# Patient Record
Sex: Female | Born: 1935
Health system: Southern US, Community
[De-identification: ages and names within clinical notes are randomized; demographics above are authoritative.]

## PROBLEM LIST (undated history)

## (undated) DIAGNOSIS — I350 Nonrheumatic aortic (valve) stenosis: Secondary | ICD-10-CM

## (undated) DIAGNOSIS — E039 Hypothyroidism, unspecified: Secondary | ICD-10-CM

## (undated) DIAGNOSIS — R51 Headache: Secondary | ICD-10-CM

## (undated) DIAGNOSIS — N184 Chronic kidney disease, stage 4 (severe): Secondary | ICD-10-CM

## (undated) DIAGNOSIS — I739 Peripheral vascular disease, unspecified: Secondary | ICD-10-CM

## (undated) DIAGNOSIS — G609 Hereditary and idiopathic neuropathy, unspecified: Secondary | ICD-10-CM

## (undated) DIAGNOSIS — I2510 Atherosclerotic heart disease of native coronary artery without angina pectoris: Secondary | ICD-10-CM

## (undated) DIAGNOSIS — L259 Unspecified contact dermatitis, unspecified cause: Secondary | ICD-10-CM

## (undated) DIAGNOSIS — I442 Atrioventricular block, complete: Secondary | ICD-10-CM

## (undated) DIAGNOSIS — D509 Iron deficiency anemia, unspecified: Secondary | ICD-10-CM

## (undated) DIAGNOSIS — E119 Type 2 diabetes mellitus without complications: Secondary | ICD-10-CM

## (undated) DIAGNOSIS — E7800 Pure hypercholesterolemia, unspecified: Secondary | ICD-10-CM

## (undated) DIAGNOSIS — J45909 Unspecified asthma, uncomplicated: Secondary | ICD-10-CM

## (undated) DIAGNOSIS — I447 Left bundle-branch block, unspecified: Secondary | ICD-10-CM

## (undated) DIAGNOSIS — R413 Other amnesia: Secondary | ICD-10-CM

## (undated) DIAGNOSIS — Z950 Presence of cardiac pacemaker: Secondary | ICD-10-CM

## (undated) DIAGNOSIS — I5032 Chronic diastolic (congestive) heart failure: Secondary | ICD-10-CM

## (undated) DIAGNOSIS — K219 Gastro-esophageal reflux disease without esophagitis: Secondary | ICD-10-CM

## (undated) DIAGNOSIS — I10 Essential (primary) hypertension: Secondary | ICD-10-CM

## (undated) DIAGNOSIS — I779 Disorder of arteries and arterioles, unspecified: Secondary | ICD-10-CM

## (undated) DIAGNOSIS — H547 Unspecified visual loss: Secondary | ICD-10-CM

## (undated) HISTORY — DX: Hereditary and idiopathic neuropathy, unspecified: G60.9

## (undated) HISTORY — DX: Left bundle-branch block, unspecified: I44.7

## (undated) HISTORY — DX: Peripheral vascular disease, unspecified: I73.9

## (undated) HISTORY — DX: Headache: R51

## (undated) HISTORY — PX: PTCA: SHX146

## (undated) HISTORY — DX: Unspecified asthma, uncomplicated: J45.909

## (undated) HISTORY — DX: Essential (primary) hypertension: I10

## (undated) HISTORY — PX: CARDIAC CATHETERIZATION: SHX172

## (undated) HISTORY — DX: Presence of cardiac pacemaker: Z95.0

## (undated) HISTORY — DX: Disorder of arteries and arterioles, unspecified: I77.9

## (undated) HISTORY — DX: Hypothyroidism, unspecified: E03.9

## (undated) HISTORY — PX: OTHER SURGICAL HISTORY: SHX169

## (undated) HISTORY — DX: Atrioventricular block, complete: I44.2

## (undated) HISTORY — DX: Unspecified contact dermatitis, unspecified cause: L25.9

## (undated) HISTORY — DX: Gastro-esophageal reflux disease without esophagitis: K21.9

## (undated) HISTORY — DX: Type 2 diabetes mellitus without complications: E11.9

## (undated) HISTORY — DX: Other amnesia: R41.3

## (undated) HISTORY — DX: Pure hypercholesterolemia, unspecified: E78.00

## (undated) HISTORY — DX: Iron deficiency anemia, unspecified: D50.9

---

## 1975-03-21 HISTORY — PX: OTHER SURGICAL HISTORY: SHX169

## 1975-03-21 HISTORY — PX: ABDOMINAL HYSTERECTOMY: SHX81

## 1995-03-21 HISTORY — PX: ESOPHAGEAL DILATION: SHX303

## 1995-09-26 ENCOUNTER — Encounter: Payer: Self-pay | Admitting: Cardiology

## 1999-02-03 ENCOUNTER — Encounter: Payer: Self-pay | Admitting: Cardiology

## 1999-04-21 HISTORY — PX: COLONOSCOPY: SHX174

## 1999-04-21 HISTORY — PX: ESOPHAGOGASTRODUODENOSCOPY: SHX1529

## 1999-05-12 ENCOUNTER — Ambulatory Visit (HOSPITAL_COMMUNITY): Admission: RE | Admit: 1999-05-12 | Discharge: 1999-05-12 | Payer: Self-pay | Admitting: *Deleted

## 1999-05-12 ENCOUNTER — Encounter (INDEPENDENT_AMBULATORY_CARE_PROVIDER_SITE_OTHER): Payer: Self-pay | Admitting: *Deleted

## 2000-09-12 ENCOUNTER — Other Ambulatory Visit: Admission: RE | Admit: 2000-09-12 | Discharge: 2000-09-12 | Payer: Self-pay | Admitting: Obstetrics and Gynecology

## 2001-03-20 HISTORY — PX: CATARACT EXTRACTION, BILATERAL: SHX1313

## 2001-10-31 ENCOUNTER — Other Ambulatory Visit: Admission: RE | Admit: 2001-10-31 | Discharge: 2001-10-31 | Payer: Self-pay | Admitting: Obstetrics and Gynecology

## 2001-12-04 ENCOUNTER — Encounter: Admission: RE | Admit: 2001-12-04 | Discharge: 2002-03-04 | Payer: Self-pay | Admitting: Family Medicine

## 2002-04-20 HISTORY — PX: ESOPHAGOGASTRODUODENOSCOPY: SHX1529

## 2002-04-22 ENCOUNTER — Ambulatory Visit (HOSPITAL_COMMUNITY): Admission: RE | Admit: 2002-04-22 | Discharge: 2002-04-22 | Payer: Self-pay | Admitting: *Deleted

## 2002-04-22 ENCOUNTER — Encounter (INDEPENDENT_AMBULATORY_CARE_PROVIDER_SITE_OTHER): Payer: Self-pay | Admitting: Specialist

## 2002-10-19 HISTORY — PX: CARDIOVASCULAR STRESS TEST: SHX262

## 2003-05-19 ENCOUNTER — Other Ambulatory Visit: Admission: RE | Admit: 2003-05-19 | Discharge: 2003-05-19 | Payer: Self-pay | Admitting: Obstetrics and Gynecology

## 2003-10-19 ENCOUNTER — Encounter: Payer: Self-pay | Admitting: Family Medicine

## 2003-10-19 LAB — CONVERTED CEMR LAB

## 2004-02-25 ENCOUNTER — Ambulatory Visit: Payer: Self-pay | Admitting: Family Medicine

## 2004-03-20 HISTORY — PX: REFRACTIVE SURGERY: SHX103

## 2004-03-20 HISTORY — PX: OTHER SURGICAL HISTORY: SHX169

## 2004-03-28 ENCOUNTER — Ambulatory Visit: Payer: Self-pay | Admitting: Family Medicine

## 2004-04-06 ENCOUNTER — Ambulatory Visit: Payer: Self-pay | Admitting: Family Medicine

## 2004-04-12 ENCOUNTER — Ambulatory Visit: Payer: Self-pay | Admitting: Family Medicine

## 2004-06-18 HISTORY — PX: COLONOSCOPY: SHX174

## 2004-07-05 ENCOUNTER — Ambulatory Visit (HOSPITAL_COMMUNITY): Admission: RE | Admit: 2004-07-05 | Discharge: 2004-07-05 | Payer: Self-pay | Admitting: *Deleted

## 2004-07-12 ENCOUNTER — Ambulatory Visit: Payer: Self-pay | Admitting: Family Medicine

## 2004-09-09 ENCOUNTER — Ambulatory Visit: Payer: Self-pay | Admitting: Family Medicine

## 2005-02-17 ENCOUNTER — Other Ambulatory Visit: Admission: RE | Admit: 2005-02-17 | Discharge: 2005-02-17 | Payer: Self-pay | Admitting: Obstetrics and Gynecology

## 2005-03-24 ENCOUNTER — Ambulatory Visit: Payer: Self-pay | Admitting: Family Medicine

## 2005-03-31 ENCOUNTER — Ambulatory Visit: Payer: Self-pay | Admitting: Family Medicine

## 2005-06-27 ENCOUNTER — Ambulatory Visit: Payer: Self-pay | Admitting: Family Medicine

## 2005-09-24 ENCOUNTER — Ambulatory Visit: Payer: Self-pay | Admitting: Gastroenterology

## 2005-09-24 ENCOUNTER — Inpatient Hospital Stay (HOSPITAL_COMMUNITY): Admission: EM | Admit: 2005-09-24 | Discharge: 2005-09-27 | Payer: Self-pay | Admitting: *Deleted

## 2005-09-25 ENCOUNTER — Encounter (INDEPENDENT_AMBULATORY_CARE_PROVIDER_SITE_OTHER): Payer: Self-pay | Admitting: Cardiology

## 2005-09-25 ENCOUNTER — Ambulatory Visit: Payer: Self-pay | Admitting: Internal Medicine

## 2005-10-04 ENCOUNTER — Ambulatory Visit: Payer: Self-pay | Admitting: Family Medicine

## 2005-10-09 ENCOUNTER — Ambulatory Visit: Payer: Self-pay | Admitting: Family Medicine

## 2005-10-18 ENCOUNTER — Ambulatory Visit: Payer: Self-pay | Admitting: Family Medicine

## 2005-12-28 ENCOUNTER — Ambulatory Visit: Payer: Self-pay

## 2006-01-04 ENCOUNTER — Ambulatory Visit: Payer: Self-pay | Admitting: Family Medicine

## 2006-06-27 ENCOUNTER — Encounter: Payer: Self-pay | Admitting: Family Medicine

## 2006-06-27 DIAGNOSIS — K219 Gastro-esophageal reflux disease without esophagitis: Secondary | ICD-10-CM

## 2006-06-27 DIAGNOSIS — E119 Type 2 diabetes mellitus without complications: Secondary | ICD-10-CM

## 2006-06-27 DIAGNOSIS — I509 Heart failure, unspecified: Secondary | ICD-10-CM | POA: Insufficient documentation

## 2006-06-27 DIAGNOSIS — D509 Iron deficiency anemia, unspecified: Secondary | ICD-10-CM | POA: Insufficient documentation

## 2006-06-27 DIAGNOSIS — I2510 Atherosclerotic heart disease of native coronary artery without angina pectoris: Secondary | ICD-10-CM

## 2006-06-27 DIAGNOSIS — J45909 Unspecified asthma, uncomplicated: Secondary | ICD-10-CM | POA: Insufficient documentation

## 2006-06-27 DIAGNOSIS — I10 Essential (primary) hypertension: Secondary | ICD-10-CM | POA: Insufficient documentation

## 2006-06-27 DIAGNOSIS — Z8709 Personal history of other diseases of the respiratory system: Secondary | ICD-10-CM | POA: Insufficient documentation

## 2006-06-27 DIAGNOSIS — L259 Unspecified contact dermatitis, unspecified cause: Secondary | ICD-10-CM

## 2006-06-27 DIAGNOSIS — N170 Acute kidney failure with tubular necrosis: Secondary | ICD-10-CM

## 2006-06-27 DIAGNOSIS — I252 Old myocardial infarction: Secondary | ICD-10-CM

## 2006-06-27 DIAGNOSIS — G609 Hereditary and idiopathic neuropathy, unspecified: Secondary | ICD-10-CM | POA: Insufficient documentation

## 2006-08-02 ENCOUNTER — Encounter: Payer: Self-pay | Admitting: Family Medicine

## 2006-09-20 ENCOUNTER — Ambulatory Visit: Payer: Self-pay | Admitting: Family Medicine

## 2006-09-20 DIAGNOSIS — E785 Hyperlipidemia, unspecified: Secondary | ICD-10-CM

## 2006-12-26 ENCOUNTER — Encounter (INDEPENDENT_AMBULATORY_CARE_PROVIDER_SITE_OTHER): Payer: Self-pay | Admitting: *Deleted

## 2007-01-01 ENCOUNTER — Ambulatory Visit: Payer: Self-pay | Admitting: Family Medicine

## 2007-01-03 ENCOUNTER — Ambulatory Visit: Payer: Self-pay | Admitting: Family Medicine

## 2007-02-19 ENCOUNTER — Encounter (INDEPENDENT_AMBULATORY_CARE_PROVIDER_SITE_OTHER): Payer: Self-pay | Admitting: *Deleted

## 2007-02-28 ENCOUNTER — Encounter: Payer: Self-pay | Admitting: Family Medicine

## 2007-07-16 ENCOUNTER — Encounter: Payer: Self-pay | Admitting: Cardiology

## 2007-08-07 ENCOUNTER — Encounter (INDEPENDENT_AMBULATORY_CARE_PROVIDER_SITE_OTHER): Payer: Self-pay | Admitting: *Deleted

## 2007-10-03 ENCOUNTER — Encounter: Payer: Self-pay | Admitting: Family Medicine

## 2007-10-14 ENCOUNTER — Encounter: Payer: Self-pay | Admitting: Family Medicine

## 2007-12-06 ENCOUNTER — Telehealth: Payer: Self-pay | Admitting: Family Medicine

## 2008-03-31 ENCOUNTER — Ambulatory Visit: Payer: Self-pay | Admitting: Family Medicine

## 2008-04-03 ENCOUNTER — Telehealth: Payer: Self-pay | Admitting: Family Medicine

## 2008-04-06 ENCOUNTER — Encounter: Payer: Self-pay | Admitting: Family Medicine

## 2008-04-07 ENCOUNTER — Telehealth: Payer: Self-pay | Admitting: Family Medicine

## 2008-04-08 ENCOUNTER — Telehealth: Payer: Self-pay | Admitting: Family Medicine

## 2008-04-15 ENCOUNTER — Encounter: Payer: Self-pay | Admitting: Family Medicine

## 2008-05-12 ENCOUNTER — Telehealth: Payer: Self-pay | Admitting: Family Medicine

## 2008-07-20 ENCOUNTER — Encounter: Payer: Self-pay | Admitting: Family Medicine

## 2008-08-27 ENCOUNTER — Ambulatory Visit: Payer: Self-pay | Admitting: Family Medicine

## 2008-09-03 ENCOUNTER — Encounter (INDEPENDENT_AMBULATORY_CARE_PROVIDER_SITE_OTHER): Payer: Self-pay | Admitting: *Deleted

## 2008-09-03 LAB — CONVERTED CEMR LAB
ALT: 12 units/L (ref 0–35)
AST: 19 units/L (ref 0–37)
HDL: 42.4 mg/dL (ref >39.00)
Hgb A1c MFr Bld: 7.7 % — ABNORMAL HIGH (ref 4.6–6.5)
Total CHOL/HDL Ratio: 3

## 2008-09-25 ENCOUNTER — Telehealth: Payer: Self-pay | Admitting: Family Medicine

## 2008-09-29 ENCOUNTER — Ambulatory Visit: Payer: Self-pay | Admitting: Family Medicine

## 2008-11-25 ENCOUNTER — Encounter: Payer: Self-pay | Admitting: Cardiology

## 2008-12-04 ENCOUNTER — Encounter: Payer: Self-pay | Admitting: Cardiology

## 2009-01-19 ENCOUNTER — Ambulatory Visit: Payer: Self-pay | Admitting: Family Medicine

## 2009-01-21 LAB — CONVERTED CEMR LAB
BUN: 40 mg/dL — ABNORMAL HIGH (ref 6–23)
CO2: 30 meq/L (ref 19–32)
Creatinine, Ser: 1.5 mg/dL — ABNORMAL HIGH (ref 0.4–1.2)
GFR calc non Af Amer: 36.2 mL/min (ref >60)
Glucose, Bld: 79 mg/dL (ref 70–99)
Hgb A1c MFr Bld: 6.6 % — ABNORMAL HIGH (ref 4.6–6.5)
Sodium: 144 meq/L (ref 135–145)

## 2009-07-02 ENCOUNTER — Ambulatory Visit: Payer: Self-pay | Admitting: Family Medicine

## 2009-08-06 ENCOUNTER — Telehealth: Payer: Self-pay | Admitting: Family Medicine

## 2009-08-19 ENCOUNTER — Ambulatory Visit: Payer: Self-pay | Admitting: Family Medicine

## 2009-08-19 DIAGNOSIS — R51 Headache: Secondary | ICD-10-CM

## 2009-08-19 DIAGNOSIS — R519 Headache, unspecified: Secondary | ICD-10-CM | POA: Insufficient documentation

## 2009-08-19 DIAGNOSIS — R413 Other amnesia: Secondary | ICD-10-CM

## 2009-08-20 LAB — CONVERTED CEMR LAB
ALT: 17 units/L (ref 0–35)
AST: 23 units/L (ref 0–37)
Basophils Relative: 0.8 % (ref 0.0–3.0)
Calcium: 8.4 mg/dL (ref 8.4–10.5)
Creatinine, Ser: 1.6 mg/dL — ABNORMAL HIGH (ref 0.4–1.2)
Creatinine,U: 156.2 mg/dL
Eosinophils Relative: 2.1 % (ref 0.0–5.0)
Folate: >20 ng/mL
GFR calc non Af Amer: 34.29 mL/min (ref >60)
Glucose, Bld: 106 mg/dL — ABNORMAL HIGH (ref 70–99)
HDL: 47.3 mg/dL (ref >39.00)
Hgb A1c MFr Bld: 7.2 % — ABNORMAL HIGH (ref 4.6–6.5)
Lymphocytes Relative: 25.8 % (ref 12.0–46.0)
Monocytes Relative: 7.5 % (ref 3.0–12.0)
Neutrophils Relative %: 63.8 % (ref 43.0–77.0)
Platelets: 223 10*3/uL (ref 150.0–400.0)
Potassium: 3.4 meq/L — ABNORMAL LOW (ref 3.5–5.1)
RBC: 3.69 M/uL — ABNORMAL LOW (ref 3.87–5.11)
Sodium: 145 meq/L (ref 135–145)
TSH: 2.6 microintl units/mL (ref 0.35–5.50)
Total CHOL/HDL Ratio: 3
VLDL: 27.6 mg/dL (ref 0.0–40.0)
Vitamin B-12: 562 pg/mL (ref 211–911)
WBC: 8.7 10*3/uL (ref 4.5–10.5)

## 2009-08-24 ENCOUNTER — Encounter: Payer: Self-pay | Admitting: Family Medicine

## 2009-09-22 ENCOUNTER — Encounter (INDEPENDENT_AMBULATORY_CARE_PROVIDER_SITE_OTHER): Payer: Self-pay | Admitting: *Deleted

## 2009-09-29 ENCOUNTER — Ambulatory Visit: Payer: Self-pay | Admitting: Family Medicine

## 2009-11-03 ENCOUNTER — Ambulatory Visit: Payer: Self-pay | Admitting: Cardiology

## 2009-11-16 ENCOUNTER — Encounter: Payer: Self-pay | Admitting: Cardiology

## 2009-11-16 ENCOUNTER — Ambulatory Visit: Payer: Self-pay

## 2009-11-16 ENCOUNTER — Ambulatory Visit: Payer: Self-pay | Admitting: Cardiology

## 2009-11-16 ENCOUNTER — Ambulatory Visit (HOSPITAL_COMMUNITY): Admission: RE | Admit: 2009-11-16 | Discharge: 2009-11-16 | Payer: Self-pay | Admitting: Cardiology

## 2009-11-24 ENCOUNTER — Telehealth: Payer: Self-pay | Admitting: Cardiology

## 2009-12-31 ENCOUNTER — Ambulatory Visit: Payer: Self-pay | Admitting: Family Medicine

## 2010-01-02 LAB — CONVERTED CEMR LAB
Albumin: 3.8 g/dL (ref 3.5–5.2)
CO2: 27 meq/L (ref 19–32)
Calcium: 8.3 mg/dL — ABNORMAL LOW (ref 8.4–10.5)
Chloride: 107 meq/L (ref 96–112)
Creatinine,U: 97.5 mg/dL
Microalb Creat Ratio: 21.9 mg/g (ref 0.0–30.0)
Microalb, Ur: 21.4 mg/dL — ABNORMAL HIGH (ref 0.0–1.9)
Phosphorus: 4.9 mg/dL — ABNORMAL HIGH (ref 2.3–4.6)
Potassium: 3.6 meq/L (ref 3.5–5.1)

## 2010-01-05 ENCOUNTER — Ambulatory Visit: Payer: Self-pay | Admitting: Family Medicine

## 2010-01-06 ENCOUNTER — Telehealth: Payer: Self-pay | Admitting: Family Medicine

## 2010-01-13 ENCOUNTER — Encounter: Payer: Self-pay | Admitting: Family Medicine

## 2010-03-24 ENCOUNTER — Ambulatory Visit
Admission: RE | Admit: 2010-03-24 | Discharge: 2010-03-24 | Payer: Self-pay | Source: Home / Self Care | Attending: Family Medicine | Admitting: Family Medicine

## 2010-03-24 DIAGNOSIS — L538 Other specified erythematous conditions: Secondary | ICD-10-CM | POA: Insufficient documentation

## 2010-04-04 ENCOUNTER — Other Ambulatory Visit: Payer: Self-pay | Admitting: Family Medicine

## 2010-04-04 ENCOUNTER — Ambulatory Visit
Admission: RE | Admit: 2010-04-04 | Discharge: 2010-04-04 | Payer: Self-pay | Source: Home / Self Care | Attending: Family Medicine | Admitting: Family Medicine

## 2010-04-04 LAB — LIPID PANEL
Cholesterol: 147 mg/dL (ref 0–200)
HDL: 44.1 mg/dL (ref >39.00)
LDL Cholesterol: 68 mg/dL (ref 0–99)
Total CHOL/HDL Ratio: 3
Triglycerides: 176 mg/dL — ABNORMAL HIGH (ref 0.0–149.0)
VLDL: 35.2 mg/dL (ref 0.0–40.0)

## 2010-04-04 LAB — AST: AST: 20 U/L (ref 0–37)

## 2010-04-04 LAB — RENAL FUNCTION PANEL
Albumin: 3.8 g/dL (ref 3.5–5.2)
BUN: 45 mg/dL — ABNORMAL HIGH (ref 6–23)
CO2: 25 mEq/L (ref 19–32)
Calcium: 8.1 mg/dL — ABNORMAL LOW (ref 8.4–10.5)
Chloride: 101 mEq/L (ref 96–112)
Creatinine, Ser: 1.7 mg/dL — ABNORMAL HIGH (ref 0.4–1.2)
GFR: 32.32 mL/min — ABNORMAL LOW (ref >60.00)
Glucose, Bld: 191 mg/dL — ABNORMAL HIGH (ref 70–99)
Phosphorus: 5.6 mg/dL — ABNORMAL HIGH (ref 2.3–4.6)
Potassium: 3.9 mEq/L (ref 3.5–5.1)
Sodium: 137 mEq/L (ref 135–145)

## 2010-04-04 LAB — ALT: ALT: 12 U/L (ref 0–35)

## 2010-04-04 LAB — HEMOGLOBIN A1C: Hgb A1c MFr Bld: 7.8 % — ABNORMAL HIGH (ref 4.6–6.5)

## 2010-04-08 ENCOUNTER — Ambulatory Visit
Admission: RE | Admit: 2010-04-08 | Discharge: 2010-04-08 | Payer: Self-pay | Source: Home / Self Care | Attending: Family Medicine | Admitting: Family Medicine

## 2010-04-21 NOTE — Assessment & Plan Note (Signed)
Summary: HA,?SHINGLES ON BACK/CLE   Vital Signs:  Patient profile:   76 year old female Height:      62 inches Weight:      113.75 pounds BMI:     20.88 Temp:     97.8 degrees F oral Pulse rate:   56 / minute Pulse rhythm:   regular BP sitting:   156 / 76  (left arm) Cuff size:   regular  Vitals Entered By: Ozzie Hoyle LPN (June  2, 624THL 579FGE AM) CC: Headache and ? shingles on back   History of Present Illness: ? if shingles on her back  area was a little sore and sensitive  could not see if rash  not bad this am  that has been going on a few weeks     head hurts L back behind her ear  is shooting pain on and off - is sharp (ear itself does not hurt)  never had it before  started few days ago  woke up with it  no fever  feeling tired   no stroke symptoms no facial droop or speech issues or numbness or weakness no trauma to head at all   feels like short term memory is not as good    no major hx of headaches in past  HTN- did not take bp med   is due thyroid check    Allergies: 1)  ! Phenergan 2)  ! Glucophage  Past History:  Past Medical History: Last updated: 03/31/2008 Anemia-iron deficiency Asthma Congestive heart failure Coronary artery disease Diabetes mellitus, type II GERD Hypertension Hypothyroidism Myocardial infarction, hx of Peripheral neuropathy Pneumonia, hx of Renal failure   renal-- Dr Sheela Stack  Past Surgical History: Last updated: 06/27/2006 CATH,PTCA, 5 BLOCKAGES:  1992-1997 ESOPHGEAL STRICTURE-1997 COLONOSCOPY, 1 POLYP 04/1999 EGD-HIATAL HERNIA "WATERMELON STOMACH" 04/1999 (SEVERE GASTRITIS) DEXA-OSTEOPENIA 09/2005 CATARACTS BILATERAL 2003 EGD GASTRITIS 04/2002 STRESS TEST NORMAL (PER PT) 10/2002 HYSTERECTOMY BLADDER TACK, PARTIAL, FIBROIDS 1977 DEXA OP 10/2003 COLONOSCOPY NEG. INT. HEM. 06/2004 CAROTID DOPPLERS 2006 ADMIT: N/V/ RENAL FAILURE 09/2005 LASER EYE SURGERY 2006 OPTHY: 11/1998 /12/01 /11/02 MICROALB. 2/07  OK, 11/06 OK    HEM.  Social History: Last updated: 03/31/2008 non smoker  widowed  Risk Factors: Smoking Status: never (06/27/2006)  Review of Systems General:  Complains of fatigue; denies chills, fever, loss of appetite, and malaise. Eyes:  Denies blurring and eye irritation. CV:  Denies chest pain or discomfort, lightheadness, palpitations, and shortness of breath with exertion. Resp:  Denies cough and wheezing. GI:  Denies abdominal pain, change in bowel habits, and indigestion. GU:  Denies dysuria and urinary frequency. MS:  Denies joint redness and joint swelling. Derm:  Complains of itching; denies lesion(s) and rash. Neuro:  Complains of headaches and poor balance; denies tingling, tremors, and weakness. Psych:  mood is fair . Endo:  Denies cold intolerance, excessive thirst, excessive urination, and heat intolerance. Heme:  Denies abnormal bruising and bleeding.  Physical Exam  General:  elderly female, well appearing  Head:  normocephalic, atraumatic, and no abnormalities observed.  no facial droop Eyes:  vision grossly intact, pupils equal, pupils round, and pupils reactive to light.  no conjunctival pallor, injection or icterus  no nystagmus Mouth:  pharynx pink and moist.   Neck:  heart M radiates to neck Chest Wall:  No deformities, masses, or tenderness noted. Lungs:  Normal respiratory effort, chest expands symmetrically. Lungs are clear to auscultation, no crackles or wheezes. Heart:  systolic M , with RRR  Abdomen:  Bowel sounds positive,abdomen soft and non-tender without masses, organomegaly or hernias noted. no renal bruits  Msk:  No deformity or scoliosis noted of thoracic or lumbar spine.   Pulses:  plus one periph pulses Extremities:  No clubbing, cyanosis, edema, or deformity noted with normal full range of motion of all joints.   Neurologic:  alert & oriented X3, cranial nerves II-XII intact, strength normal in all extremities, sensation intact to  light touch, gait normal, DTRs symmetrical and normal, finger-to-nose normal, toes down bilaterally on Babinski, and Romberg negative.   Skin:  Intact without suspicious lesions or rashes Cervical Nodes:  No lymphadenopathy noted Inguinal Nodes:  No significant adenopathy Psych:  pt is somewhat irritable today - but conversative and mentally clear   Diabetes Management Exam:    Foot Exam (with socks and/or shoes not present):       Sensory-Pinprick/Light touch:          Left medial foot (L-4): normal          Left dorsal foot (L-5): normal          Left lateral foot (S-1): normal          Right medial foot (L-4): normal          Right dorsal foot (L-5): normal          Right lateral foot (S-1): normal       Sensory-Monofilament:          Left foot: normal          Right foot: normal       Inspection:          Left foot: normal          Right foot: normal       Nails:          Left foot: normal          Right foot: normal   Impression & Recommendations:  Problem # 1:  HEADACHE (ICD-784.0) Assessment New new/ intermittent and controlled with tylenol check labs incl cbc and esr  if nl - consider imaging, in light of age and no prev hx  adv to update asap if worse or any neurologic symptms (rev)  Her updated medication list for this problem includes:    Metoprolol Tartrate 50 Mg Tabs (Metoprolol tartrate) .Marland Kitchen... 1 by mouth twice a day  Orders: Venipuncture [id number]) TLB-Lipid Panel (80061-LIPID) TLB-Renal Function Panel (80069-RENAL) TLB-CBC Platelet - w/Differential (85025-CBCD) TLB-TSH (Thyroid Stimulating Hormone) (84443-TSH) TLB-ALT (SGPT) (84460-ALT) TLB-AST (SGOT) (84450-SGOT) TLB-B12 + Folate Pnl (82746_82607-B12/FOL) TLB-A1C / Hgb A1C (Glycohemoglobin) (83036-A1C) TLB-Sedimentation Rate (ESR) (85652-ESR) TLB-Microalbumin/Creat Ratio, Urine (82043-MALB)  Problem # 2:  MEMORY LOSS (ICD-780.93) Assessment: New short term- mild and may be age rel disc imp of things  to keep brain active like reading and math  lab today disc at f/u Orders: Venipuncture [id number]) TLB-Lipid Panel (80061-LIPID) TLB-Renal Function Panel (80069-RENAL) TLB-CBC Platelet - w/Differential (85025-CBCD) TLB-TSH (Thyroid Stimulating Hormone) (84443-TSH) TLB-ALT (SGPT) (84460-ALT) TLB-AST (SGOT) (84450-SGOT) TLB-B12 + Folate Pnl (82746_82607-B12/FOL) TLB-A1C / Hgb A1C (Glycohemoglobin) (83036-A1C) TLB-Sedimentation Rate (ESR) (85652-ESR) TLB-Microalbumin/Creat Ratio, Urine (82043-MALB)  Problem # 3:  HYPERCHOLESTEROLEMIA, PURE (ICD-272.0) Assessment: Unchanged  lab today on statin and fair diet disc at f/u Her updated medication list for this problem includes:    Lipitor 40 Mg Tabs (Atorvastatin calcium) .Marland Kitchen... 1 by mouth daily  Orders: Venipuncture [id number]) TLB-Lipid Panel (80061-LIPID) TLB-Renal Function Panel (80069-RENAL) TLB-CBC Platelet - w/Differential (85025-CBCD) TLB-TSH (Thyroid Stimulating Hormone) (  84443-TSH) TLB-ALT (SGPT) (84460-ALT) TLB-AST (SGOT) (84450-SGOT) TLB-B12 + Folate Pnl [id number]) TLB-A1C / Hgb A1C (Glycohemoglobin) (83036-A1C) TLB-Sedimentation Rate (ESR) (85652-ESR) TLB-Microalbumin/Creat Ratio, Urine (82043-MALB)  Labs Reviewed: SGOT: 19 (08/27/2008)   SGPT: 12 (08/27/2008)   HDL:42.40 (08/27/2008)  LDL:73 (08/27/2008)  Chol:143 (08/27/2008)  Trig:138.0 (08/27/2008)  Problem # 4:  RENAL FAILURE, ACUTE W/TUBULAR NECROSIS LSN (ICD-584.5) Assessment: Comment Only send for last renal note to see when due to be seen   Problem # 5:  HYPOTHYROIDISM (ICD-244.9) Assessment: Unchanged  check tsh today and adv some inc fatigue and memory issues  Her updated medication list for this problem includes:    Synthroid 50 Mcg Tabs (Levothyroxine sodium) .Marland Kitchen... 1 by mouth daily  Orders: Venipuncture [id number]) TLB-Lipid Panel (80061-LIPID) TLB-Renal Function Panel (80069-RENAL) TLB-CBC Platelet - w/Differential (85025-CBCD) TLB-TSH  (Thyroid Stimulating Hormone) (84443-TSH) TLB-ALT (SGPT) (84460-ALT) TLB-AST (SGOT) (84450-SGOT) TLB-B12 + Folate Pnl [id number]) TLB-A1C / Hgb A1C (Glycohemoglobin) (83036-A1C) TLB-Sedimentation Rate (ESR) (85652-ESR) TLB-Microalbumin/Creat Ratio, Urine (82043-MALB)  Labs Reviewed: TSH: 0.68 (08/27/2008)    HgBA1c: 6.6 (01/19/2009) Chol: 143 (08/27/2008)   HDL: 42.40 (08/27/2008)   LDL: 73 (08/27/2008)   TG: 138.0 (08/27/2008)  Problem # 6:  DIABETES MELLITUS, TYPE II (ICD-250.00) Assessment: Deteriorated  labs today  on glipizide  per pt sugars high disc healthy diet (low simple sugar/ choose complex carbs/ low sat fat) diet and exercise in detail  disc in detail at f/u  Her updated medication list for this problem includes:    Glipizide Xl 10 Mg Tb24 (Glipizide) .Marland Kitchen... 1 by mouth twice a day    Vasotec 5 Mg Tabs (Enalapril maleate) .Marland Kitchen... 1 by mouth once daily  Orders: Venipuncture [id number]) TLB-Lipid Panel (80061-LIPID) TLB-Renal Function Panel (80069-RENAL) TLB-CBC Platelet - w/Differential (85025-CBCD) TLB-TSH (Thyroid Stimulating Hormone) (84443-TSH) TLB-ALT (SGPT) (84460-ALT) TLB-AST (SGOT) (84450-SGOT) TLB-B12 + Folate Pnl [id number]) TLB-A1C / Hgb A1C (Glycohemoglobin) (83036-A1C) TLB-Sedimentation Rate (ESR) (85652-ESR) TLB-Microalbumin/Creat Ratio, Urine (82043-MALB)  Labs Reviewed: Creat: 1.5 (01/19/2009)     Last Eye Exam: diabetic retinopathy (07/20/2008) Reviewed HgBA1c results: 6.6 (01/19/2009)  7.7 (08/27/2008)  Problem # 7:  HYPERTENSION (ICD-401.9) Assessment: Deteriorated bp is up and pt has not taken her med today will re check with med at f/u Her updated medication list for this problem includes:    Furosemide 20 Mg Tabs (Furosemide) .Marland Kitchen... 1 by mouth daily    Metoprolol Tartrate 50 Mg Tabs (Metoprolol tartrate) .Marland Kitchen... 1 by mouth twice a day    Norvasc 10 Mg Tabs (Amlodipine besylate) .Marland Kitchen... 1 by mouth once daily    Vasotec 5 Mg  Tabs (Enalapril maleate) .Marland Kitchen... 1 by mouth once daily  Orders: Venipuncture [id number]) TLB-Lipid Panel (80061-LIPID) TLB-Renal Function Panel (80069-RENAL) TLB-CBC Platelet - w/Differential (85025-CBCD) TLB-TSH (Thyroid Stimulating Hormone) (84443-TSH) TLB-ALT (SGPT) (84460-ALT) TLB-AST (SGOT) (84450-SGOT) TLB-B12 + Folate Pnl [id number]) TLB-A1C / Hgb A1C (Glycohemoglobin) (83036-A1C) TLB-Sedimentation Rate (ESR) (85652-ESR) TLB-Microalbumin/Creat Ratio, Urine (82043-MALB)  Complete Medication List: 1)  Synthroid 50 Mcg Tabs (Levothyroxine sodium) .Marland Kitchen.. 1 by mouth daily 2)  Omeprazole 20 Mg Cpdr (Omeprazole) .Marland Kitchen.. 1 by mouth daily 3)  Glipizide Xl 10 Mg Tb24 (Glipizide) .Marland Kitchen.. 1 by mouth twice a day 4)  Furosemide 20 Mg Tabs (Furosemide) .Marland Kitchen.. 1 by mouth daily 5)  Lipitor 40 Mg Tabs (Atorvastatin calcium) .Marland Kitchen.. 1 by mouth daily 6)  Asa 325 Mg  .Marland KitchenMarland Kitchen. 1 by mouth daily 7)  Metoprolol Tartrate 50 Mg Tabs (Metoprolol tartrate) .Marland Kitchen.. 1 by mouth twice a day 8)  Acetaminophen  .... By mouth  as directed prn 9)  Proair Hfa 108 (90 Base) Mcg/act Aers (Albuterol sulfate) .... 2 puffs q 4 hours as needed 10)  Tandem Plus 162-115.2-1 Mg Caps (Fefum-fepo-fa-b cmp-c-zn-mn-cu) .... Take 1 tablet by mouth once a day 11)  Advair Diskus 250-50 Mcg/dose Misc (Fluticasone-salmeterol) .Marland Kitchen.. 1 puff twice a day as needed 12)  Onetouch Ultra Test Strp (Glucose blood) .... Check blood sugar two times a day as needed 13)  Onetouch Ultra System W/device Kit (Blood glucose monitoring suppl) .... As directed 250.00 14)  Norvasc 10 Mg Tabs (Amlodipine besylate) .Marland Kitchen.. 1 by mouth once daily 15)  Vasotec 5 Mg Tabs (Enalapril maleate) .Marland Kitchen.. 1 by mouth once daily 16)  Isosorbide Mononitrate Cr 60 Mg Xr24h-tab (Isosorbide mononitrate) .... Take one tablet by mouth twice a day 17)  Elocon 0.1 % Crea (Mometasone furoate) .... Apply to affected area once daily until cleared  Patient Instructions: 1)  follow up in 2-3 weeks  please  2)  labs today 3)  update me asap if worse headache or if rash develops on your back  4)  if labs are ok -- I may order imaging study on your brain  5)  please send for last note from kidney doctor - Dr Burman Foster  Current Allergies (reviewed today): ! PHENERGAN ! GLUCOPHAGE

## 2010-04-21 NOTE — Assessment & Plan Note (Signed)
Summary: rash on legs/ alc   Vital Signs:  Patient profile:   76 year old female Height:      62 inches Weight:      116.50 pounds BMI:     21.39 Temp:     98.2 degrees F oral Pulse rate:   64 / minute Pulse rhythm:   regular BP sitting:   144 / 70  (left arm) Cuff size:   regular  Vitals Entered By: Ozzie Hoyle LPN (April 15, 624THL 624THL PM) CC: rash on legs for one week   History of Present Illness: had a fall and about 3-4 days later broke out in a rash  abrasions on L leg - and then used dermoplast -- with benzethonium coride and benzocaine  started to itch  trying not to scratch put alcohol on it   then used lanacane spray -same ingredients   fell going out son's back door -- stepped down steps - and missed one - and hit on her chin- bruise   is hard to keep her sugar under control at times --does not know what else she can take for Dm that will not hurt her kidneys -- will ask her renal doctor   Allergies: 1)  ! Phenergan 2)  ! Glucophage  Past History:  Past Medical History: Last updated: 03/31/2008 Anemia-iron deficiency Asthma Congestive heart failure Coronary artery disease Diabetes mellitus, type II GERD Hypertension Hypothyroidism Myocardial infarction, hx of Peripheral neuropathy Pneumonia, hx of Renal failure   renal-- Dr Sheela Stack  Past Surgical History: Last updated: 06/27/2006 CATH,PTCA, 5 BLOCKAGES:  1992-1997 ESOPHGEAL STRICTURE-1997 COLONOSCOPY, 1 POLYP 04/1999 EGD-HIATAL HERNIA "WATERMELON STOMACH" 04/1999 (SEVERE GASTRITIS) DEXA-OSTEOPENIA 09/2005 CATARACTS BILATERAL 2003 EGD GASTRITIS 04/2002 STRESS TEST NORMAL (PER PT) 10/2002 HYSTERECTOMY BLADDER TACK, PARTIAL, FIBROIDS 1977 DEXA OP 10/2003 COLONOSCOPY NEG. INT. HEM. 06/2004 CAROTID DOPPLERS 2006 ADMIT: N/V/ RENAL FAILURE 09/2005 LASER EYE SURGERY 2006 OPTHY: 11/1998 /12/01 /11/02 MICROALB. 2/07 OK, 11/06 OK    HEM.  Social History: Last updated: 03/31/2008 non smoker    widowed  Risk Factors: Smoking Status: never (06/27/2006)  Review of Systems General:  Denies fatigue, loss of appetite, and malaise. Eyes:  Denies blurring, discharge, and eye irritation. CV:  Denies chest pain or discomfort and palpitations. Resp:  Denies cough and wheezing. GI:  Denies abdominal pain. MS:  Denies joint pain. Derm:  Complains of itching, lesion(s), and rash; denies insect bite(s) and poor wound healing. Neuro:  Denies numbness and tingling. Endo:  Denies excessive thirst and excessive urination. Heme:  Denies abnormal bruising and bleeding.  Physical Exam  General:  elderly female, well appearing  Head:  normocephalic, atraumatic, and no abnormalities observed.   Eyes:  vision grossly intact, pupils equal, pupils round, and pupils reactive to light.  no conj inj or d/c  Mouth:  pharynx pink and moist.   Neck:  No deformities, masses, or tenderness noted. Lungs:  Normal respiratory effort, chest expands symmetrically. Lungs are clear to auscultation, no crackles or wheezes. Heart:  systolic M , with RRR Msk:  no acute joint changes  Pulses:  plus one periph pulses Extremities:  No clubbing, cyanosis, edema, or deformity noted with normal full range of motion of all joints.   Neurologic:  sensation intact to light touch and gait normal.   Skin:  L leg  papular red rash ankle to knee in splotches  3 scabs well healing with no signs of infx or drainage no tenderness  Cervical Nodes:  No lymphadenopathy  noted Inguinal Nodes:  No significant adenopathy Psych:  normal affect, talkative and pleasant   Diabetes Management Exam:    Foot Exam (with socks and/or shoes not present):       Sensory-Pinprick/Light touch:          Left medial foot (L-4): normal          Left dorsal foot (L-5): normal          Left lateral foot (S-1): normal          Right medial foot (L-4): normal          Right dorsal foot (L-5): normal          Right lateral foot (S-1): normal        Sensory-Monofilament:          Left foot: normal          Right foot: normal       Inspection:          Left foot: normal          Right foot: normal       Nails:          Left foot: normal          Right foot: normal   Impression & Recommendations:  Problem # 1:  CONTACT DERMATITIS&OTHER ECZEMA DUE UNSPEC CAUSE (ICD-692.9) Assessment New  contact derm on leg surrounding healing scabs after a fall  I suspect from trauma and also poss allergy to benzocane spray- adv to stop that  will try elocon cream daily and update (exp to keep this off scabs as it may retard healing and thin skin) will use triple abx on scabs  trial of claritin or zyrtec for itch also  urged to keep area cool update if not imp in 1 week Her updated medication list for this problem includes:    Elocon 0.1 % Crea (Mometasone furoate) .Marland Kitchen... Apply to affected area once daily until cleared  Orders: Prescription Created Electronically [phone])  Complete Medication List: 1)  Synthroid 50 Mcg Tabs (Levothyroxine sodium) .Marland Kitchen.. 1 by mouth daily 2)  Omeprazole 20 Mg Cpdr (Omeprazole) .Marland Kitchen.. 1 by mouth daily 3)  Glipizide Xl 10 Mg Tb24 (Glipizide) .Marland Kitchen.. 1 by mouth twice a day 4)  Furosemide 20 Mg Tabs (Furosemide) .Marland Kitchen.. 1 by mouth daily 5)  Lipitor 40 Mg Tabs (Atorvastatin calcium) .Marland Kitchen.. 1 by mouth daily 6)  Asa 325 Mg  .Marland KitchenMarland Kitchen. 1 by mouth daily 7)  Metoprolol Tartrate 50 Mg Tabs (Metoprolol tartrate) .Marland Kitchen.. 1 by mouth twice a day 8)  Acetaminophen  .... By mouth as directed prn 9)  Proair Hfa 108 (90 Base) Mcg/act Aers (Albuterol sulfate) .... 2 puffs q 4 hours as needed 10)  Tandem Plus 162-115.2-1 Mg Caps (Fefum-fepo-fa-b cmp-c-zn-mn-cu) .... Take 1 tablet by mouth once a day 11)  Advair Diskus 250-50 Mcg/dose Misc (Fluticasone-salmeterol) .Marland Kitchen.. 1 puff twice a day as needed 12)  Onetouch Ultra Test Strp (Glucose blood) .... Check blood sugar two times a day as needed 13)  Onetouch Ultra System W/device Kit (Blood glucose  monitoring suppl) .... As directed 250.00 14)  Norvasc 10 Mg Tabs (Amlodipine besylate) .Marland Kitchen.. 1 by mouth once daily 15)  Vasotec 5 Mg Tabs (Enalapril maleate) .Marland Kitchen.. 1 by mouth once daily 16)  Isosorbide Mononitrate Cr 60 Mg Xr24h-tab (Isosorbide mononitrate) .... Take one tablet by mouth twice a day 17)  Elocon 0.1 % Crea (Mometasone furoate) .... Apply to affected area once daily until  cleared  Patient Instructions: 1)  you can try claritin 10 mg or zyrtec 10 mg once daily for itch (the claritin is less sedating )  2)  keep leg clean and cool  3)  avoid the sprays you were using  4)  use the elicon cream on rash areas (avoid scabs)  5)  use triple antibiotic ointment over the counter on scabs until they peel off  6)  update me if not improved in 1 week  7)  keep from scratching -- if needed - cut finger nails short  Prescriptions: ELOCON 0.1 % CREA (MOMETASONE FUROATE) apply to affected area once daily until cleared  #1 medium x 1   Entered and Authorized by:   Allena Earing MD   Signed by:   Allena Earing MD on 07/02/2009   Method used:   Electronically to        [facility]  [address] [id number] (retail)       [address]       [place], [place]  [address]       Ph: [phone]       Fax: [phone]   RxID:   [id number]   Current Allergies (reviewed today): ! PHENERGAN ! GLUCOPHAGE

## 2010-04-21 NOTE — Assessment & Plan Note (Signed)
Summary: 3 month follow upr/bh   Vital Signs:  Patient profile:   76 year old female Weight:      111.75 pounds BMI:     20.51 Temp:     97.8 degrees F oral Pulse rate:   84 / minute Pulse rhythm:   regular BP sitting:   120 / 60  (left arm) Cuff size:   regular  Vitals Entered By: Maudie Mercury Dance CMA ([place]) (April 08, 2010 11:18 AM) CC: 3 month follow up   History of Present Illness: here for f/u of DM and HTN and lipids   feels worn out at times  has more energy in the am -- gets started at projects  feels much wrose after she takes her medicine   wt is down 2 lb  HTN in good control 120/60-- also being watched by renal   cr was 1.7 this time with labs consistent with hyperparathyroid  will have appt soon for f/u- just had labs   lipids great Last Lipid ProfileCholesterol: 147 (04/04/2010 10:31:40 AM)HDL:  44.10 (04/04/2010 10:31:40 AM)LDL:  68 (04/04/2010 10:31:40 AM)Triglycerides:  Last Liver profileSGOT:  20 (04/04/2010 10:31:40 AM)SPGT:  12 (04/04/2010 10:31:40 AM)T. Bili:  Alk Phos:     AIC 7.8 up from 7.7  pt declined lantus due to cost  diet is not good  she does watch breads and starches - but not always good about that  appetite is good   yeast rash under breasts is much better but needs a refil of powder  tries to check her sugars - but overall makes her fingers hurt too much  was 180 this am      Allergies: 1)  ! Phenergan 2)  ! Glucophage  Past History:  Past Medical History: Last updated: 11/03/2009 1. Anemia-iron deficiency 2. Asthma 3. Coronary artery disease: Unstable angina in 1997 with PTCA of large D1.  Myoview (9/10) at [facility] cardiology showed EF 84%, stable perfusion images with no ischemia.  4. Diabetes mellitus, type II 5. GERD 6. Hypertension 7. Hypothyroidism 8. Pneumonia, hx of 9. CKD: creatinine 1.6 in 6/11 10. Chronic LBBB 11. Aortic stenosis (mild): Echo (4/09) with EF 55-65%, mild aortic stenosis and aortic  insufficiency, mild MR.  12. Carotid stenosis: Carotid dopplers 0000000 with [id number] LICA stenosis.   renal-- Dr Marval Regal  Past Surgical History: Last updated: 06/27/2006 CATH,PTCA, 5 BLOCKAGES:  1992-1997 ESOPHGEAL STRICTURE-1997 COLONOSCOPY, 1 POLYP 04/1999 EGD-HIATAL HERNIA "WATERMELON STOMACH" 04/1999 (SEVERE GASTRITIS) DEXA-OSTEOPENIA 09/2005 CATARACTS BILATERAL 2003 EGD GASTRITIS 04/2002 STRESS TEST NORMAL (PER PT) 10/2002 HYSTERECTOMY BLADDER TACK, PARTIAL, FIBROIDS 1977 DEXA OP 10/2003 COLONOSCOPY NEG. INT. HEM. 06/2004 CAROTID DOPPLERS 2006 ADMIT: N/V/ RENAL FAILURE 09/2005 LASER EYE SURGERY 2006 OPTHY: 11/1998 /12/01 /11/02 MICROALB. 2/07 OK, 11/06 OK    HEM.  Family History: Last updated: 11/03/2009 Noncontributory  Social History: Last updated: 11/03/2009 non smoker  widowed, lives with son between [place] and [place]  Risk Factors: Smoking Status: never (06/27/2006)  Review of Systems General:  Complains of fatigue and malaise; denies fever and loss of appetite; malaise at times. Eyes:  Denies blurring and discharge. CV:  Complains of fatigue; denies chest pain or discomfort, lightheadness, palpitations, shortness of breath with exertion, and swelling of feet. Resp:  Denies cough, shortness of breath, and wheezing. GI:  Denies change in bowel habits, indigestion, nausea, and vomiting. GU:  Denies discharge, dysuria, and urinary frequency. MS:  Denies joint pain, joint redness, and joint swelling. Derm:  Complains of itching; occ rash under  breasts --- med helps  is improved now . Neuro:  Denies numbness, tingling, and tremors. Psych:  while she denies depression- pt seems frustrated and unmotivated states she does not enjoy anything . Endo:  Denies cold intolerance, excessive thirst, excessive urination, and heat intolerance. Heme:  Denies abnormal bruising and bleeding.  Physical Exam  General:  elderly and frail appearing  overall well Head:   normocephalic, atraumatic, and no abnormalities observed.   Eyes:  vision grossly intact, pupils equal, pupils round, and pupils reactive to light.  no conjunctival pallor, injection or icterus  Mouth:  pharynx pink and moist.   Neck:  supple with full rom and no masses or thyromegally, no JVD or carotid bruit  Chest Wall:  No deformities, masses, or tenderness noted. Lungs:  Normal respiratory effort, chest expands symmetrically. Lungs are clear to auscultation, no crackles or wheezes. Heart:  systolic M , with RRR Abdomen:  Bowel sounds positive,abdomen soft and non-tender without masses, organomegaly or hernias noted. no renal bruits  Msk:  No deformity or scoliosis noted of thoracic or lumbar spine.   Pulses:  plus one periph pulses Extremities:  No clubbing, cyanosis, edema, or deformity noted with normal full range of motion of all joints.   Neurologic:  sensation intact to light touch, gait normal, and DTRs symmetrical and normal.   Skin:  Intact without suspicious lesions or rashes nl color and turgor  Cervical Nodes:  No lymphadenopathy noted Inguinal Nodes:  No significant adenopathy Psych:  pt is very negative in general / seems unmotivated and bitter  however does smile and laugh at times  no overt signs of depression  Diabetes Management Exam:    Foot Exam (with socks and/or shoes not present):       Sensory-Pinprick/Light touch:          Left medial foot (L-4): diminished          Left dorsal foot (L-5): diminished          Left lateral foot (S-1): diminished          Right medial foot (L-4): diminished          Right dorsal foot (L-5): diminished          Right lateral foot (S-1): diminished   Impression & Recommendations:  Problem # 1:  INTERTRIGO, CANDIDAL (ICD-695.89) Assessment Improved  this is almost gone  refilled nystatin powder for as needed use   Orders: Prescription Created Electronically [phone])  Problem # 2:  HYPERCHOLESTEROLEMIA, PURE  (ICD-272.0) Assessment: Unchanged  good control without change  enc low sat fat diet  labs reviewed  Her updated medication list for this problem includes:    Lipitor 40 Mg Tabs (Atorvastatin calcium) .Marland Kitchen... 1 by mouth daily  Labs Reviewed: SGOT: 20 (04/04/2010)   SGPT: 12 (04/04/2010)   HDL:44.10 (04/04/2010), 47.30 (08/19/2009)  LDL:68 (04/04/2010), 76 (08/19/2009)  Chol:147 (04/04/2010), 151 (08/19/2009)  Trig:176.0 (04/04/2010), 138.0 (08/19/2009)  Problem # 3:  HYPERTENSION (ICD-401.9) Assessment: Unchanged  good control - no change in tx  Her updated medication list for this problem includes:    Furosemide 20 Mg Tabs (Furosemide) .Marland Kitchen... 1 by mouth daily    Metoprolol Tartrate 50 Mg Tabs (Metoprolol tartrate) .Marland Kitchen... 1 by mouth twice a day    Norvasc 10 Mg Tabs (Amlodipine besylate) .Marland Kitchen... 1 by mouth once daily    Enalapril Maleate 2.5 Mg Tabs (Enalapril maleate) ..... One twice a day  BP today: 120/60 Prior BP: 140/70 (03/24/2010)  Labs Reviewed: K+: 3.9 (04/04/2010) Creat: : 1.7 (04/04/2010)   Chol: 147 (04/04/2010)   HDL: 44.10 (04/04/2010)   LDL: 68 (04/04/2010)   TG: 176.0 (04/04/2010)  Problem # 4:  DIABETES MELLITUS, TYPE II (ICD-250.00) this is difficult to tx in light of renal failure and pt's inability to afford lantus/ other meds  also pt is resistant to diet change and unmotivated inst her to talk to renal physician about safe options and let me know -- will have to figure out what her ins covers  Her updated medication list for this problem includes:    Glipizide Xl 10 Mg Tb24 (Glipizide) .Marland Kitchen... 1 by mouth twice a day    Aspirin Ec 325 Mg Tbec (Aspirin) .Marland Kitchen... Take one tablet by mouth daily    Enalapril Maleate 2.5 Mg Tabs (Enalapril maleate) ..... One twice a day  Complete Medication List: 1)  Synthroid 50 Mcg Tabs (Levothyroxine sodium) .Marland Kitchen.. 1 by mouth daily 2)  Omeprazole 20 Mg Cpdr (Omeprazole) .Marland Kitchen.. 1 by mouth daily 3)  Glipizide Xl 10 Mg Tb24 (Glipizide) .Marland Kitchen..  1 by mouth twice a day 4)  Furosemide 20 Mg Tabs (Furosemide) .Marland Kitchen.. 1 by mouth daily 5)  Lipitor 40 Mg Tabs (Atorvastatin calcium) .Marland Kitchen.. 1 by mouth daily 6)  Aspirin Ec 325 Mg Tbec (Aspirin) .... Take one tablet by mouth daily 7)  Metoprolol Tartrate 50 Mg Tabs (Metoprolol tartrate) .Marland Kitchen.. 1 by mouth twice a day 8)  Proair Hfa 108 (90 Base) Mcg/act Aers (Albuterol sulfate) .... 2 puffs q 4 hours as needed 9)  Advair Diskus 250-50 Mcg/dose Misc (Fluticasone-salmeterol) .Marland Kitchen.. 1 puff twice a day as needed 10)  Onetouch Ultra Test Strp (Glucose blood) .... Check blood sugar two times a day as needed 11)  Onetouch Ultra System W/device Kit (Blood glucose monitoring suppl) .... As directed 250.00 12)  Norvasc 10 Mg Tabs (Amlodipine besylate) .Marland Kitchen.. 1 by mouth once daily 13)  Enalapril Maleate 2.5 Mg Tabs (Enalapril maleate) .... One twice a day 14)  Isosorbide Mononitrate Cr 60 Mg Xr24h-tab (Isosorbide mononitrate) .... Take one tablet by mouth twice a day 15)  Calcitriol 0.25 Mcg Caps (Calcitriol) .... Take one tablet by mouth on monday, wednesday, and friday 16)  Pedi-dri 100000 Unit/gm Powd (Nystatin) .... Apply to area two times a day until rash is gone. 17)  Se-tan Plus 162-115.2-1 Mg Caps (Fefum-fepo-fa-b cmp-c-zn-mn-cu) .Marland Kitchen.. 1 by mouth once daily  Patient Instructions: 1)  got to any bookstore or look on line for book called "sugar busters" 2)  since you can't go to diabetic teaching -- this book outlines some good ideas for low sugar eating  3)  you can use the powder (nystatin ) under breasts indefinitely  4)  ask your kidney doctor what they recommend for further diabetic control... then will try find out how coverage is  5)  try to eat low sugar and low starch -- as much as possible  6)  I will not schedule your next follow up until I hear from you  Prescriptions: PEDI-DRI 100000 UNIT/GM POWD (NYSTATIN) Apply to area two times a day until rash is gone.  #1 medium x 5   Entered and Authorized  by:   Allena Earing MD   Signed by:   Allena Earing MD on 04/08/2010   Method used:   Electronically to        [id number]* (retail)       [address]  [place], [place]  [address]       Ph: [phone]       Fax: [phone]   RxID:   [id number]    Orders Added: 1)  Prescription Created Electronically [id number] 2)  Est. Patient Level IV [id number]    Current Allergies (reviewed today): ! PHENERGAN ! GLUCOPHAGE

## 2010-04-21 NOTE — Letter (Signed)
Summary: [place] No Show Letter  [place] at [facility]  [address] [place], [place] [address]   Phone: [phone]  Fax: [phone]    09/22/2009 MRN: [id number]  Barbara Garner [address] [place], [place]  [address]   Dear [name],   [facility] records indicate that you missed your scheduled appointment with ______Lab_______________ on __7.6.11__________.  Please contact this office to reschedule your appointment as soon as possible.  It is important that you keep your scheduled appointments with your physician, so we can provide you the best care possible.  Please be advised that there may be a charge for "no show" appointments.    Sincerely,   [place] at [facility]

## 2010-04-21 NOTE — Miscellaneous (Signed)
Summary: Prednisone 20mg  rx   Clinical Lists Changes  Medications: Added new medication of PREDNISONE 20 MG TABS (PREDNISONE) Take 1 tablet by mouth once a day - Signed Rx of PREDNISONE 20 MG TABS (PREDNISONE) Take 1 tablet by mouth once a day;  #30 x 1;  Signed;  Entered by: Ozzie Hoyle LPN;  Authorized by: Allena Earing MD;  Method used: [facility] [id number] [address] [place], [place], [place]  [address], Ph: [phone], Fax: [phone]    Prescriptions: PREDNISONE 20 MG TABS (PREDNISONE) Take 1 tablet by mouth once a day  #30 x 1   Entered by:   Ozzie Hoyle LPN   Authorized by:   Allena Earing MD   Signed by:   Ozzie Hoyle LPN on [id number]   Method used:   Electronically to        [id number]* (retail)       [address]       [place], [place]  [address]       Ph: [phone]       Fax: [phone]   RxID:   [id number]  Pt will check with [facility] to make sure rx is ready for pickup before going. Ozzie Hoyle LPN  June  2, 624THL QA348G PM  Current Allergies: ! PHENERGAN ! GLUCOPHAGE

## 2010-04-21 NOTE — Assessment & Plan Note (Signed)
Summary: rash/alc   Vital Signs:  Patient profile:   76 year old female Height:      62 inches Weight:      113.75 pounds BMI:     20.88 Temp:     98.4 degrees F oral Pulse rate:   64 / minute Pulse rhythm:   regular BP sitting:   140 / 70  (left arm) Cuff size:   regular  Vitals Entered By: Sherrian Divers CMA [place]) (March 24, 2010 10:40 AM) CC: rash   History of Present Illness: 76 yo here for rash for several weeks under her breasts.  Started out as a very small area under both breasts, very itchy and has now progressed. Feels warm to touch, waking her up at night. Feels feverish as well.  Pt is DM, admits sugars have been high lately.    Current Medications (verified): 1)  Synthroid 50 Mcg  Tabs (Levothyroxine Sodium) .Marland Kitchen.. 1 By Mouth Daily 2)  Omeprazole 20 Mg  Cpdr (Omeprazole) .Marland Kitchen.. 1 By Mouth Daily 3)  Glipizide Xl 10 Mg  Tb24 (Glipizide) .Marland Kitchen.. 1 By Mouth Twice A Day 4)  Furosemide 20 Mg  Tabs (Furosemide) .Marland Kitchen.. 1 By Mouth Daily 5)  Lipitor 40 Mg  Tabs (Atorvastatin Calcium) .Marland Kitchen.. 1 By Mouth Daily 6)  Aspirin Ec 325 Mg Tbec (Aspirin) .... Take One Tablet By Mouth Daily 7)  Metoprolol Tartrate 50 Mg  Tabs (Metoprolol Tartrate) .Marland Kitchen.. 1 By Mouth Twice A Day 8)  Proair Hfa 108 (90 Base) Mcg/act  Aers (Albuterol Sulfate) .... 2 Puffs Q 4 Hours As Needed 9)  Advair Diskus 250-50 Mcg/dose  Misc (Fluticasone-Salmeterol) .Marland Kitchen.. 1 Puff Twice A Day As Needed 10)  Onetouch Ultra Test  Strp (Glucose Blood) .... Check Blood Sugar Two Times A Day As Needed 11)  Onetouch Ultra System W/device Kit (Blood Glucose Monitoring Suppl) .... As Directed 250.00 12)  Norvasc 10 Mg Tabs (Amlodipine Besylate) .Marland Kitchen.. 1 By Mouth Once Daily 13)  Enalapril Maleate 2.5 Mg Tabs (Enalapril Maleate) .... One Twice A Day 14)  Isosorbide Mononitrate Cr 60 Mg Xr24h-Tab (Isosorbide Mononitrate) .... Take One Tablet By Mouth Twice A Day 15)  Calcitriol 0.25 Mcg Caps (Calcitriol) .... Take One Tablet By Mouth On  Monday, Wednesday, and Friday 16)  Pedi-Dri 100000 Unit/gm Powd (Nystatin) .... Apply To Area Two Times A Day Until Rash Is Gone. 17)  Keflex 500 Mg Caps (Cephalexin) .Marland Kitchen.. 1 Tab By Mouth Two Times A Day X 7 Days  Allergies: 1)  ! Phenergan 2)  ! Glucophage  Past History:  Past Medical History: Last updated: 11/03/2009 1. Anemia-iron deficiency 2. Asthma 3. Coronary artery disease: Unstable angina in 1997 with PTCA of large D1.  Myoview (9/10) at [facility] cardiology showed EF 84%, stable perfusion images with no ischemia.  4. Diabetes mellitus, type II 5. GERD 6. Hypertension 7. Hypothyroidism 8. Pneumonia, hx of 9. CKD: creatinine 1.6 in 6/11 10. Chronic LBBB 11. Aortic stenosis (mild): Echo (4/09) with EF 55-65%, mild aortic stenosis and aortic insufficiency, mild MR.  12. Carotid stenosis: Carotid dopplers 0000000 with [id number] LICA stenosis.   renal-- Dr Marval Regal  Past Surgical History: Last updated: 06/27/2006 CATH,PTCA, 5 BLOCKAGES:  1992-1997 ESOPHGEAL STRICTURE-1997 COLONOSCOPY, 1 POLYP 04/1999 EGD-HIATAL HERNIA "WATERMELON STOMACH" 04/1999 (SEVERE GASTRITIS) DEXA-OSTEOPENIA 09/2005 CATARACTS BILATERAL 2003 EGD GASTRITIS 04/2002 STRESS TEST NORMAL (PER PT) 10/2002 HYSTERECTOMY BLADDER TACK, PARTIAL, FIBROIDS 1977 DEXA OP 10/2003 COLONOSCOPY NEG. INT. HEM. 06/2004 CAROTID DOPPLERS 2006 ADMIT: N/V/ RENAL FAILURE  09/2005 LASER EYE SURGERY 2006 OPTHY: 11/1998 /12/01 /11/02 MICROALB. 2/07 OK, 11/06 OK    HEM.  Family History: Last updated: 11/03/2009 Noncontributory  Social History: Last updated: 11/03/2009 non smoker  widowed, lives with son between [place] and [place]  Risk Factors: Smoking Status: never (06/27/2006)  Review of Systems      See HPI General:  Complains of chills and fever. GI:  Denies nausea and vomiting.  Physical Exam  General:  elderly female, well appearing  Skin:  very erythematous warm rash under breast bilaterally with  satellite lesions, several small pustular lesions as well. Psych:  normal affect, talkative and pleasant    Impression & Recommendations:  Problem # 1:  INTERTRIGO, CANDIDAL (ICD-695.89) Assessment New  likely with superimposed bacterial infection as well. Treat with topical nystatin powder, keflex x 7 days.   See pt instructions for details.    Orders: Prescription Created Electronically [phone])  Complete Medication List: 1)  Synthroid 50 Mcg Tabs (Levothyroxine sodium) .Marland Kitchen.. 1 by mouth daily 2)  Omeprazole 20 Mg Cpdr (Omeprazole) .Marland Kitchen.. 1 by mouth daily 3)  Glipizide Xl 10 Mg Tb24 (Glipizide) .Marland Kitchen.. 1 by mouth twice a day 4)  Furosemide 20 Mg Tabs (Furosemide) .Marland Kitchen.. 1 by mouth daily 5)  Lipitor 40 Mg Tabs (Atorvastatin calcium) .Marland Kitchen.. 1 by mouth daily 6)  Aspirin Ec 325 Mg Tbec (Aspirin) .... Take one tablet by mouth daily 7)  Metoprolol Tartrate 50 Mg Tabs (Metoprolol tartrate) .Marland Kitchen.. 1 by mouth twice a day 8)  Proair Hfa 108 (90 Base) Mcg/act Aers (Albuterol sulfate) .... 2 puffs q 4 hours as needed 9)  Advair Diskus 250-50 Mcg/dose Misc (Fluticasone-salmeterol) .Marland Kitchen.. 1 puff twice a day as needed 10)  Onetouch Ultra Test Strp (Glucose blood) .... Check blood sugar two times a day as needed 11)  Onetouch Ultra System W/device Kit (Blood glucose monitoring suppl) .... As directed 250.00 12)  Norvasc 10 Mg Tabs (Amlodipine besylate) .Marland Kitchen.. 1 by mouth once daily 13)  Enalapril Maleate 2.5 Mg Tabs (Enalapril maleate) .... One twice a day 14)  Isosorbide Mononitrate Cr 60 Mg Xr24h-tab (Isosorbide mononitrate) .... Take one tablet by mouth twice a day 15)  Calcitriol 0.25 Mcg Caps (Calcitriol) .... Take one tablet by mouth on monday, wednesday, and friday 16)  Pedi-dri 100000 Unit/gm Powd (Nystatin) .... Apply to area two times a day until rash is gone. 17)  Keflex 500 Mg Caps (Cephalexin) .Marland Kitchen.. 1 tab by mouth two times a day x 7 days  Patient Instructions: 1)  Try to keep the area dry. 2)  You  can use over the counter hydrocortisone cream for itching. Prescriptions: KEFLEX 500 MG CAPS (CEPHALEXIN) 1 tab by mouth two times a day x 7 days  #14 x 0   Entered and Authorized by:   Arnette Norris MD   Signed by:   Arnette Norris MD on 03/24/2010   Method used:   [facility] [address] [id number]       [address]       [place], [place]  [address]       Ph: [phone]       Fax: [phone]   RxID:   [id number] PEDI-DRI 100000 UNIT/GM POWD ([place]) Apply to area two times a day until rash is gone.  #30 g x 0   Entered and Authorized by:   Arnette Norris MD  Signed by:   Arnette Norris MD on 03/24/2010   Method used:   [facility]        [id number]       [address]       [place], [place]  [address]       Ph: [phone]       Fax: [phone]   RxID:   [id number]    Orders Added: 1)  Est. Patient Level IV [id number] 2)  Prescription Created Electronically [phone]    Current Allergies (reviewed today): ! PHENERGAN ! GLUCOPHAGE

## 2010-04-21 NOTE — Letter (Signed)
Summary: Dr.Joseph Coladonato,[facility],Note  Dr[facility]Joseph Coladonato, [facility],Note   Imported By: Virgia Land 01/19/2010 16:34:01  _____________________________________________________________________  External Attachment:    Type:   Image     Comment:   External Document

## 2010-04-21 NOTE — Progress Notes (Signed)
Summary: Multiple refill request  Phone Note Refill Request Call back at [phone] Message from:  [facility] on Aug 06, 2009 1:03 PM  Refills Requested: Medication #1:  GLIPIZIDE XL 10 MG  TB24 1 by mouth twice a day  Medication #2:  FUROSEMIDE 20 MG  TABS 1 by mouth daily  Medication #3:  PROAIR HFA 108 (90 BASE) MCG/ACT  AERS 2 puffs q 4 hours as needed  Medication #4:  METOPROLOL TARTRATE 50 MG  TABS 1 by mouth twice a day [facility] request refills on above 4 meds. Pt has not had labs since 09/06/08. No appt for visits or labs are scheduled. Please advise.    Method Requested: Telephone to Pharmacy Initial call taken by: Ozzie Hoyle LPN,  May 20, 624THL 579FGE PM  Follow-up for Phone Call        she actually had labs in fall I think - nonetheless due for 6 mo check please schedule fasting lab this summer lipid/ast/alt/enal / AIC /tsh 250.0, 401.1 , 244.9and then f/u  px written on EMR for call in  Follow-up by: Allena Earing MD,  Aug 06, 2009 1:21 PM  Additional Follow-up for Phone Call Additional follow up Details #1::        Patient notified as instructed by telephone. Fasting lab appointment scheduled as instructed 09/22/09 at 8:50 with f/u appt with Dr Glori Bickers on 09/29/09 at 11:30am. [facility]oned [facility] as instructed. Ozzie Hoyle LPN  May 20, 624THL 624THL PM     Prescriptions: METOPROLOL TARTRATE 50 MG  TABS (METOPROLOL TARTRATE) 1 by mouth twice a day  #60 x 5   Entered and Authorized by:   Allena Earing MD   Signed by:   Ozzie Hoyle LPN on 579FGE   Method used:   Telephoned to ...       [id number] ([facility]       [address], [place]  [address]       Ph: [phone]       Fax: [phone]   RxID:   [id number] PROAIR HFA 108 (90 BASE) MCG/ACT  AERS (ALBUTEROL SULFATE) 2 puffs q 4 hours as needed  #1 mdi x 5   Entered and Authorized by:   Allena Earing MD   Signed by:   Ozzie Hoyle LPN on 579FGE   Method used:   Telephoned to ...       [facility] [address] [id number] ([facility])       [address]       [place], [place]  [address]       Ph: [phone]       Fax: [phone]   RxID:   [id number] FUROSEMIDE 20 MG  TABS (FUROSEMIDE) 1 by mouth daily  #30 x 5   Entered and Authorized by:   Allena Earing MD   Signed by:   Ozzie Hoyle LPN on 579FGE   Method used:   Telephoned to ...       [id number]       [address]       [place], [place]  [address]       Ph: [phone]  Fax: [phone]   [id number] GLIPIZIDE XL 10 MG  TB24 (GLIPIZIDE) 1 by mouth twice a day  #60 x 5   Entered and Authorized by:   Allena Earing MD   Signed by:   Ozzie Hoyle LPN on 579FGE   Method used:   Telephoned to ...       [id number]       [address]       [place], [place]  [address]       Ph: [phone]       Fax: [phone]   RxID:   [id number]

## 2010-04-21 NOTE — Assessment & Plan Note (Signed)
Summary: FOLLOW UP AFTER LABS/RI   Vital Signs:  Patient profile:   77 year old female Height:      62 inches Weight:      112.75 pounds BMI:     20.70 Temp:     98.2 degrees F oral Pulse rate:   56 / minute Pulse rhythm:   regular BP sitting:   136 / 60  (left arm) Cuff size:   regular  Vitals Entered By: Ozzie Hoyle LPN (July 13, 624THL 624THL AM) CC: follow-up visit after labs   History of Present Illness: here for f/u of lipids/ DM, temporal arteritis - with hx of renal failure  since last visit started on prednisone for temp arteritis- has seen neuro decided to watch and check mri/ mra and temp art [place] before bx  she says headache was gone by the time she got to his office  was inst to hold the prednisone and then call if headache returns  no change in vision  last lipids good with HDL 47 and LDL 76  K was 3.4   cr 1.6  nl tsh   hb 11.9  AIC is 7.2 before prednisone  on full dose glipizide sugars have been a bit better (no more lows)  usually in the 170s  is high because she is not eating right  eats at fast food places  can't eat a meal at home due to dogs and cats   was having trouble with memory  Allergies: 1)  ! Phenergan 2)  ! Glucophage  Past History:  Past Medical History: Last updated: 03/31/2008 Anemia-iron deficiency Asthma Congestive heart failure Coronary artery disease Diabetes mellitus, type II GERD Hypertension Hypothyroidism Myocardial infarction, hx of Peripheral neuropathy Pneumonia, hx of Renal failure   renal-- Dr Sheela Stack  Past Surgical History: Last updated: 06/27/2006 CATH,PTCA, 5 BLOCKAGES:  1992-1997 ESOPHGEAL STRICTURE-1997 COLONOSCOPY, 1 POLYP 04/1999 EGD-HIATAL HERNIA "WATERMELON STOMACH" 04/1999 (SEVERE GASTRITIS) DEXA-OSTEOPENIA 09/2005 CATARACTS BILATERAL 2003 EGD GASTRITIS 04/2002 STRESS TEST NORMAL (PER PT) 10/2002 HYSTERECTOMY BLADDER TACK, PARTIAL, FIBROIDS 1977 DEXA OP 10/2003 COLONOSCOPY NEG. INT. HEM.  06/2004 CAROTID DOPPLERS 2006 ADMIT: N/V/ RENAL FAILURE 09/2005 LASER EYE SURGERY 2006 OPTHY: 11/1998 /12/01 /11/02 MICROALB. 2/07 OK, 11/06 OK    HEM.  Social History: Last updated: 03/31/2008 non smoker  widowed  Risk Factors: Smoking Status: never (06/27/2006)  Review of Systems General:  Complains of fatigue; denies chills, fever, loss of appetite, and malaise. Eyes:  Denies blurring and eye irritation. CV:  Denies chest pain or discomfort, palpitations, shortness of breath with exertion, and swelling of feet. Resp:  Denies cough, shortness of breath, and wheezing. GI:  Denies abdominal pain, change in bowel habits, nausea, and vomiting. GU:  Denies dysuria, hematuria, and urinary frequency. MS:  Complains of stiffness; denies muscle aches and muscle weakness. Derm:  Denies itching, lesion(s), poor wound healing, and rash. Neuro:  Denies numbness and tingling. Psych:  Denies anxiety and depression. Endo:  Denies cold intolerance, excessive thirst, excessive urination, and heat intolerance. Heme:  Denies abnormal bruising and bleeding.  Physical Exam  General:  elderly female, well appearing  Head:  normocephalic, atraumatic, and no abnormalities observed.  no temporal tenderness Eyes:  vision grossly intact, pupils equal, pupils round, and pupils reactive to light.  no conjunctival pallor, injection or icterus  Ears:  R ear normal and L ear normal.   Nose:  no nasal discharge.   Mouth:  pharynx pink and moist.   Neck:  supple with  full rom and no masses or thyromegally, no JVD or carotid bruit  Chest Wall:  No deformities, masses, or tenderness noted. Lungs:  Normal respiratory effort, chest expands symmetrically. Lungs are clear to auscultation, no crackles or wheezes. Heart:  systolic M , with RRR Abdomen:  Bowel sounds positive,abdomen soft and non-tender without masses, organomegaly or hernias noted. no renal bruits  Msk:  No deformity or scoliosis noted of thoracic  or lumbar spine.   Pulses:  plus one periph pulses Extremities:  No clubbing, cyanosis, edema, or deformity noted with normal full range of motion of all joints.   Neurologic:  sensation intact to light touch, gait normal, and DTRs symmetrical and normal.   Skin:  Intact without suspicious lesions or rashes Cervical Nodes:  No lymphadenopathy noted Inguinal Nodes:  No significant adenopathy Psych:  pt is down and frustrated today  Diabetes Management Exam:    Foot Exam (with socks and/or shoes not present):       Sensory-Pinprick/Light touch:          Left medial foot (L-4): normal          Left dorsal foot (L-5): normal          Left lateral foot (S-1): normal          Right medial foot (L-4): normal          Right dorsal foot (L-5): normal          Right lateral foot (S-1): normal       Sensory-Monofilament:          Left foot: normal          Right foot: normal       Inspection:          Left foot: normal          Right foot: normal       Nails:          Left foot: normal          Right foot: normal   Impression & Recommendations:  Problem # 1:  HEADACHE (ICD-784.0) Assessment Improved much imp after short course of pred- in fact resolved  ? if was TA after all followed by neurol - will watch carefully Her updated medication list for this problem includes:    Metoprolol Tartrate 50 Mg Tabs (Metoprolol tartrate) .Marland Kitchen... 1 by mouth twice a day  Problem # 2:  HYPERCHOLESTEROLEMIA, PURE (ICD-272.0) Assessment: Unchanged  this is stable without change asked pt to stop the fast/fried food  Her updated medication list for this problem includes:    Lipitor 40 Mg Tabs (Atorvastatin calcium) .Marland Kitchen... 1 by mouth daily  Labs Reviewed: SGOT: 23 (08/19/2009)   SGPT: 17 (08/19/2009)   HDL:47.30 (08/19/2009), 42.40 (08/27/2008)  LDL:76 (08/19/2009), 73 (08/27/2008)  Chol:151 (08/19/2009), 143 (08/27/2008)  Trig:138.0 (08/19/2009), 138.0 (08/27/2008)  Problem # 3:  RENAL INSUFFICIENCY  (ICD-588.9) Assessment: Unchanged stable - pt will see when due for renal f/u feels about the same   Problem # 4:  HYPOTHYROIDISM (ICD-244.9) Assessment: Unchanged  this is clinically stable without dose change Her updated medication list for this problem includes:    Synthroid 50 Mcg Tabs (Levothyroxine sodium) .Marland Kitchen... 1 by mouth daily  Labs Reviewed: TSH: 2.60 (08/19/2009)    HgBA1c: 7.2 (08/19/2009) Chol: 151 (08/19/2009)   HDL: 47.30 (08/19/2009)   LDL: 76 (08/19/2009)   TG: 138.0 (08/19/2009)  Problem # 5:  HYPERTENSION (ICD-401.9) Assessment: Improved  better on meds- at goal  disc lower sodium diet  Her updated medication list for this problem includes:    Furosemide 20 Mg Tabs (Furosemide) .Marland Kitchen... 1 by mouth daily    Metoprolol Tartrate 50 Mg Tabs (Metoprolol tartrate) .Marland Kitchen... 1 by mouth twice a day    Norvasc 10 Mg Tabs (Amlodipine besylate) .Marland Kitchen... 1 by mouth once daily    Vasotec 5 Mg Tabs (Enalapril maleate) .Marland Kitchen... 1 by mouth once daily  BP today: 136/60- re check 128/60 Prior BP: 156/76 (08/19/2009)  Labs Reviewed: K+: 3.4 (08/19/2009) Creat: : 1.6 (08/19/2009)   Chol: 151 (08/19/2009)   HDL: 47.30 (08/19/2009)   LDL: 76 (08/19/2009)   TG: 138.0 (08/19/2009)  BP today: 136/60 Prior BP: 156/76 (08/19/2009)  Labs Reviewed: K+: 3.4 (08/19/2009) Creat: : 1.6 (08/19/2009)   Chol: 151 (08/19/2009)   HDL: 47.30 (08/19/2009)   LDL: 76 (08/19/2009)   TG: 138.0 (08/19/2009)  Problem # 6:  DIABETES MELLITUS, TYPE II (ICD-250.00) Assessment: Deteriorated  this may need imp  pt unsure if she can eat DM diet  disc this in detail  offered DM refresher class if not - consider lantus insulin  Her updated medication list for this problem includes:    Glipizide Xl 10 Mg Tb24 (Glipizide) .Marland Kitchen... 1 by mouth twice a day    Vasotec 5 Mg Tabs (Enalapril maleate) .Marland Kitchen... 1 by mouth once daily  Labs Reviewed: Creat: 1.6 (08/19/2009)     Last Eye Exam: diabetic retinopathy  (07/20/2008) Reviewed HgBA1c results: 7.2 (08/19/2009)  6.6 (01/19/2009)  Complete Medication List: 1)  Synthroid 50 Mcg Tabs (Levothyroxine sodium) .Marland Kitchen.. 1 by mouth daily 2)  Omeprazole 20 Mg Cpdr (Omeprazole) .Marland Kitchen.. 1 by mouth daily 3)  Glipizide Xl 10 Mg Tb24 (Glipizide) .Marland Kitchen.. 1 by mouth twice a day 4)  Furosemide 20 Mg Tabs (Furosemide) .Marland Kitchen.. 1 by mouth daily 5)  Lipitor 40 Mg Tabs (Atorvastatin calcium) .Marland Kitchen.. 1 by mouth daily 6)  Asa 325 Mg  .Marland KitchenMarland Kitchen. 1 by mouth daily 7)  Metoprolol Tartrate 50 Mg Tabs (Metoprolol tartrate) .Marland Kitchen.. 1 by mouth twice a day 8)  Acetaminophen  .... By mouth as directed prn 9)  Proair Hfa 108 (90 Base) Mcg/act Aers (Albuterol sulfate) .... 2 puffs q 4 hours as needed 10)  Tandem Plus 162-115.2-1 Mg Caps (Fefum-fepo-fa-b cmp-c-zn-mn-cu) .... Take 1 tablet by mouth once a day 11)  Advair Diskus 250-50 Mcg/dose Misc (Fluticasone-salmeterol) .Marland Kitchen.. 1 puff twice a day as needed 12)  Onetouch Ultra Test Strp (Glucose blood) .... Check blood sugar two times a day as needed 13)  Onetouch Ultra System W/device Kit (Blood glucose monitoring suppl) .... As directed 250.00 14)  Norvasc 10 Mg Tabs (Amlodipine besylate) .Marland Kitchen.. 1 by mouth once daily 15)  Vasotec 5 Mg Tabs (Enalapril maleate) .Marland Kitchen.. 1 by mouth once daily 16)  Isosorbide Mononitrate Cr 60 Mg Xr24h-tab (Isosorbide mononitrate) .... Take one tablet by mouth twice a day 17)  Elocon 0.1 % Crea (Mometasone furoate) .... Apply to affected area once daily until cleared 18)  Prednisone 20 Mg Tabs (Prednisone) .... Take 1 tablet by mouth once a day  Patient Instructions: 1)  you have a choice to make about diabetes -- you need better control -- either to watch diet more carefully ( I would send you to a diabetic refresher class and then work on diet ) , or start you on a once a day insulin 2)  you think about it and let me know what you decide  3)  follow up  with your kidney doctor when it is time  4)  we will call Dr Madelyn Brunner  office to see when you are due for a cardiology follow up  5)  schedule labs in 3 months AIC / microalbumin/ renal 250.0 and then follow up  Current Allergies (reviewed today): ! PHENERGAN ! GLUCOPHAGE

## 2010-04-21 NOTE — Progress Notes (Signed)
Summary: Cancelled Nurse visit appt- for injections training...  Phone Note Call from Patient   Summary of Call: Pt called, request that we cancel the appt for the nurse visit on Oct 26th to teach and train her on the insulin injection. Pt says she would not be taking this medication,too expensive.  Says she will continue to take what she has taken in the  past..Allen Norris  January 06, 2010 1:55 PM  Initial call taken by: Allen Norris,  January 06, 2010 1:56 PM  Follow-up for Phone Call        if we are not going to treat her diabetes more aggressively- please stress importance of low sugar diet  I will take the med off her list - thanks for the update Follow-up by: Allena Earing MD,  January 06, 2010 4:36 PM  Additional Follow-up for Phone Call Additional follow up Details #1::        Left message for patient to call back. Ozzie Hoyle LPN  October 20, 624THL 4:41 PM   Patient notified as instructed by telephone. Pt said she is watching her diet very closely for sugar now.Ozzie Hoyle LPN  October 21, 624THL 3:51 PM      Appended Document: Cancelled Nurse visit appt- for injections training... Pt came this AM and said person who cancelled her appt told her she needed to come to this appt. Pt said insulin and supplies were going to cost her $400. Pt could not afford. Pt will watch diet closely for sugar and monitor BS at home any problems pt will call Dr Glori Bickers.

## 2010-04-21 NOTE — Consult Note (Signed)
Summary: [facility]  [facility]   Imported By: Edmonia Sliger 09/01/2009 09:43:54  _____________________________________________________________________  External Attachment:    Type:   Image     Comment:   External Document

## 2010-04-21 NOTE — Letter (Signed)
Summary: [facility] Progress Notes 1997, Carotid Duplex US 2006  [facility] Progress Notes 1997, Carotid Duplex US 2006   Imported By: Sallee Provencal 11/17/2009 15:59:54  _____________________________________________________________________  External Attachment:    Type:   Image     Comment:   External Document

## 2010-04-21 NOTE — Assessment & Plan Note (Signed)
Summary: 3 MONTH FOLLOW UPR/BH   Vital Signs:  Patient profile:   76 year old female Height:      62 inches Weight:      114 pounds BMI:     20.93 Temp:     97.9 degrees F oral Pulse rate:   60 / minute Pulse rhythm:   regular BP sitting:   160 / 82  (left arm) Cuff size:   regular  Vitals Entered By: Ozzie Hoyle LPN (October 19, 624THL 11:38 AM)  Serial Vital Signs/Assessments:  Time      Position  BP       Pulse  Resp  Temp     By                     160/80                         Allena Earing MD  CC: three month f/u Comments Pt rushing this morning.   History of Present Illness: here for f/u of HTN and DM   recent lipids are stable  we it up 3 lb  renal fxn stable with cr 1.5 nad elevated microalb renal f/u -- last appt was 11/2 years   AS- is seeing cardiol no sob or chest pain   headache had resolved -- never came back  neurologist decided not to put her through scan after all   AIC is up to 7.7 diet --eats out all the time - is easier for her  went to classes in the past - does not remember  afraid to do the classes because she does not drive  on glipizide cannot have metformin due to renal insuff or actos due to  heart  checks her sugars at home until her fingers get sore -- has not been checking lately  sometimes eats sweets  eats at [employer]   feet don't feel numb but are "weird " at night  no frequent urination  bp is high on first check today  has not been checking at home at other offices has been fine     Allergies: 1)  ! Phenergan 2)  ! Glucophage  Past History:  Past Medical History: Last updated: 11/03/2009 1. Anemia-iron deficiency 2. Asthma 3. Coronary artery disease: Unstable angina in 1997 with PTCA of large D1.  Myoview (9/10) at [facility] cardiology showed EF 84%, stable perfusion images with no ischemia.  4. Diabetes mellitus, type II 5. GERD 6. Hypertension 7. Hypothyroidism 8. Pneumonia, hx of 9. CKD: creatinine  1.6 in 6/11 10. Chronic LBBB 11. Aortic stenosis (mild): Echo (4/09) with EF 55-65%, mild aortic stenosis and aortic insufficiency, mild MR.  12. Carotid stenosis: Carotid dopplers 0000000 with [id number] LICA stenosis.   renal-- Dr Marval Regal  Past Surgical History: Last updated: 06/27/2006 CATH,PTCA, 5 BLOCKAGES:  1992-1997 ESOPHGEAL STRICTURE-1997 COLONOSCOPY, 1 POLYP 04/1999 EGD-HIATAL HERNIA "WATERMELON STOMACH" 04/1999 (SEVERE GASTRITIS) DEXA-OSTEOPENIA 09/2005 CATARACTS BILATERAL 2003 EGD GASTRITIS 04/2002 STRESS TEST NORMAL (PER PT) 10/2002 HYSTERECTOMY BLADDER TACK, PARTIAL, FIBROIDS 1977 DEXA OP 10/2003 COLONOSCOPY NEG. INT. HEM. 06/2004 CAROTID DOPPLERS 2006 ADMIT: N/V/ RENAL FAILURE 09/2005 LASER EYE SURGERY 2006 OPTHY: 11/1998 /12/01 /11/02 MICROALB. 2/07 OK, 11/06 OK    HEM.  Family History: Last updated: 11/03/2009 Noncontributory  Social History: Last updated: 11/03/2009 non smoker  widowed, lives with son between [place] and [place]  Risk Factors: Smoking Status: never (06/27/2006)  Review of Systems General:  Complains  of fatigue; denies loss of appetite and malaise. Eyes:  Denies blurring and eye irritation. CV:  Denies chest pain or discomfort, palpitations, and shortness of breath with exertion. Resp:  Denies cough and shortness of breath. GI:  Denies abdominal pain, change in bowel habits, indigestion, nausea, and vomiting. GU:  Denies dysuria and urinary frequency. MS:  Denies muscle aches and cramps. Derm:  Denies poor wound healing and rash. Neuro:  Complains of numbness and tingling; denies headaches, visual disturbances, and weakness. Psych:  mood is fair . Endo:  Denies cold intolerance, excessive thirst, excessive urination, and heat intolerance. Heme:  Denies abnormal bruising and bleeding.  Physical Exam  General:  elderly female, well appearing  Head:  normocephalic, atraumatic, and no abnormalities observed.  no temporal tenderness Eyes:   vision grossly intact, pupils equal, pupils round, and pupils reactive to light.   Mouth:  pharynx pink and moist.   Neck:  supple with full rom and no masses or thyromegally, no JVD or carotid bruit  Chest Wall:  No deformities, masses, or tenderness noted. Lungs:  Normal respiratory effort, chest expands symmetrically. Lungs are clear to auscultation, no crackles or wheezes. Heart:  systolic M , with RRR Abdomen:  Bowel sounds positive,abdomen soft and non-tender without masses, organomegaly or hernias noted. no renal bruits  Msk:  No deformity or scoliosis noted of thoracic or lumbar spine.   Pulses:  plus one periph pulses Extremities:  No clubbing, cyanosis, edema, or deformity noted with normal full range of motion of all joints.   Neurologic:  sensation intact to light touch, gait normal, and DTRs symmetrical and normal.   Skin:  Intact without suspicious lesions or rashes Cervical Nodes:  No lymphadenopathy noted Inguinal Nodes:  No significant adenopathy Psych:  normal affect, talkative and pleasant   Diabetes Management Exam:    Foot Exam (with socks and/or shoes not present):       Sensory-Pinprick/Light touch:          Left medial foot (L-4): normal          Left dorsal foot (L-5): normal          Left lateral foot (S-1): normal          Right medial foot (L-4): normal          Right dorsal foot (L-5): normal          Right lateral foot (S-1): normal       Sensory-Monofilament:          Left foot: normal          Right foot: normal       Inspection:          Left foot: normal          Right foot: normal       Nails:          Left foot: normal          Right foot: normal   Impression & Recommendations:  Problem # 1:  RENAL INSUFFICIENCY (ICD-588.9) Assessment Unchanged hx of tubular necrosis and likely DM nephropathy cr stable at 1.5  overdue for f/u with her kidney dr- will make appt for her  Orders: Nephrology Referral (Nephro) Prescription Created  Electronically [phone])  Problem # 2:  HYPERTENSION (ICD-401.9) Assessment: Deteriorated  this is worse- pt thinks from rushing  will check at home and report back also f/u renal Her updated medication list for this problem includes:    Furosemide  20 Mg Tabs (Furosemide) .Marland Kitchen... 1 by mouth daily    Metoprolol Tartrate 50 Mg Tabs (Metoprolol tartrate) .Marland Kitchen... 1 by mouth twice a day    Norvasc 10 Mg Tabs (Amlodipine besylate) .Marland Kitchen... 1 by mouth once daily    Enalapril Maleate 2.5 Mg Tabs (Enalapril maleate) ..... One twice a day  BP today: 160/82 Prior BP: 138/80 (11/03/2009)  Labs Reviewed: K+: 3.6 (12/31/2009) Creat: : 1.5 (12/31/2009)   Chol: 151 (08/19/2009)   HDL: 47.30 (08/19/2009)   LDL: 76 (08/19/2009)   TG: 138.0 (08/19/2009)  Orders: Prescription Created Electronically [phone])  Problem # 3:  DIABETES MELLITUS, TYPE II (ICD-250.00) Assessment: Deteriorated  this is worse  pt does not think she could drive to DM classes - was adv to get sugarbusters book  inst to start lantus insulin once daily with solostar pen (nurse visit in 1 wk for inst on use)  then check sugar once daily (diff times - see inst)- and update 2 wk will grad inc dose  f/u after lab in 3 mo  flu shot today The following medications were removed from the medication list:    Lantus Solostar 100 Unit/ml Soln (Insulin glargine) ..... Inject 10 units each evening as directed  please disp with needles Her updated medication list for this problem includes:    Glipizide Xl 10 Mg Tb24 (Glipizide) .Marland Kitchen... 1 by mouth twice a day    Aspirin Ec 325 Mg Tbec (Aspirin) .Marland Kitchen... Take one tablet by mouth daily    Enalapril Maleate 2.5 Mg Tabs (Enalapril maleate) ..... One twice a day  Labs Reviewed: Creat: 1.5 (12/31/2009)     Last Eye Exam: diabetic retinopathy (07/20/2008) Reviewed HgBA1c results: 7.7 (12/31/2009)  7.2 (08/19/2009)  Orders: Prescription Created Electronically [phone])  Complete Medication List: 1)   Synthroid 50 Mcg Tabs (Levothyroxine sodium) .Marland Kitchen.. 1 by mouth daily 2)  Omeprazole 20 Mg Cpdr (Omeprazole) .Marland Kitchen.. 1 by mouth daily 3)  Glipizide Xl 10 Mg Tb24 (Glipizide) .Marland Kitchen.. 1 by mouth twice a day 4)  Furosemide 20 Mg Tabs (Furosemide) .Marland Kitchen.. 1 by mouth daily 5)  Lipitor 40 Mg Tabs (Atorvastatin calcium) .Marland Kitchen.. 1 by mouth daily 6)  Aspirin Ec 325 Mg Tbec (Aspirin) .... Take one tablet by mouth daily 7)  Metoprolol Tartrate 50 Mg Tabs (Metoprolol tartrate) .Marland Kitchen.. 1 by mouth twice a day 8)  Proair Hfa 108 (90 Base) Mcg/act Aers (Albuterol sulfate) .... 2 puffs q 4 hours as needed 9)  Advair Diskus 250-50 Mcg/dose Misc (Fluticasone-salmeterol) .Marland Kitchen.. 1 puff twice a day as needed 10)  Onetouch Ultra Test Strp (Glucose blood) .... Check blood sugar two times a day as needed 11)  Onetouch Ultra System W/device Kit (Blood glucose monitoring suppl) .... As directed 250.00 12)  Norvasc 10 Mg Tabs (Amlodipine besylate) .Marland Kitchen.. 1 by mouth once daily 13)  Enalapril Maleate 2.5 Mg Tabs (Enalapril maleate) .... One twice a day 14)  Isosorbide Mononitrate Cr 60 Mg Xr24h-tab (Isosorbide mononitrate) .... Take one tablet by mouth twice a day  Other Orders: Flu Vaccine 88yrs + MEDICARE PATIENTS [id number]) Administration Flu vaccine - MCR [id number])  Patient Instructions: 1)  we will do referral for renal follow up at check out  2)  blood pressure is high - so start watching it at home  3)  try to stick to diabetic diet -- if you want to be referred for diabetic teaching in the future let me know  4)  there is a book called sugarbusters that you can  get at most bookstores or on line  5)  start lantus insulin 10 units each evening  6)  schedule nurse visit in 1 week to learn how to do insulin injections  7)  check am fasting glucose on M, W, F , and pm (2 hours after a meal) glucose on T, TH, S , S  8)  keep a log of those  9)  send me sugar report 2 weeks after starting lantus  10)  schedule fasting labs in 3 months  lipid/ast/alt/renal/ AIC 250.0, 401.1 and then follow up  Prescriptions: LANTUS SOLOSTAR 100 UNIT/ML SOLN (INSULIN GLARGINE) inject 10 units each evening as directed  please disp with needles  #1 month x 11   Entered and Authorized by:   Allena Earing MD   Signed by:   Allena Earing MD on 01/05/2010   Method used:   Electronically to        [facility]  [address] [id number]retail[facility]       [address]       [place], [place]  [address]       Ph: [phone]       Fax: [phone]   RxID:   [id number]    Orders Added: 1)  Flu Vaccine 95yrs + MEDICARE PATIENTS [Q2039] 2)  Administration Flu vaccine - MCR [id number] 3)  Nephrology Referral [Nephro] 4)  Prescription Created Electronically [id number] 5)  Est. Patient Level IV [id number]    Current Allergies (reviewed today): ! PHENERGAN ! GLUCOPHAGE  Flu Vaccine Consent Questions     Do you have a history of severe allergic reactions to this vaccine? no    Any prior history of allergic reactions to egg and/or gelatin? no    Do you have a sensitivity to the preservative Thimersol? no    Do you have a past history of Guillan-Barre Syndrome? no    Do you currently have an acute febrile illness? no    Have you ever had a severe reaction to latex? no    Vaccine information given and explained to patient? yes    Are you currently pregnant? no    Lot Number:[id number]   Exp Date:09/17/2010   Site Given  Left Deltoid [email or web address] Ozzie Hoyle LPN  October 19, 624THL 11:42 AM

## 2010-04-21 NOTE — Assessment & Plan Note (Signed)
Summary: np6/cad/htn/hx of Mi/former pt of tysinger/jml   Visit Type:  new pt visit Primary Nari Vannatter:  Allena Earing MD  CC:  pt used to see Dr. Glade Lloyd until he retired...Dr. Glori Bickers suggested she follow up w/[facility]....denies any cardiac complaints today.  History of Present Illness: 76 yo with history of CAD s/p unstable angina/PTCA to D1 in 1997, CKD, mild AS presents to establish cardiology followup.  She has been seen by Dr. Glade Lloyd in the past.  Last myoview in 9/10 showed no ischemia.  Last echo in 2009 showed normal LV systolic function and mild AS.  She has been doing well. She has had no recent chest pain or tightness.  She has no significant exertional dyspnea and is able to climb steps without problems.  She does all her ADLs.    ECG: NSR, LBBB (old)  Labs (6/11): K 3.4, creatinine 1.6, LDL 76, HDL 47, TSH normal  Current Medications (verified): 1)  Synthroid 50 Mcg  Tabs (Levothyroxine Sodium) .Marland Kitchen.. 1 By Mouth Daily 2)  Omeprazole 20 Mg  Cpdr (Omeprazole) .Marland Kitchen.. 1 By Mouth Daily 3)  Glipizide Xl 10 Mg  Tb24 (Glipizide) .Marland Kitchen.. 1 By Mouth Twice A Day 4)  Furosemide 20 Mg  Tabs (Furosemide) .Marland Kitchen.. 1 By Mouth Daily 5)  Lipitor 40 Mg  Tabs (Atorvastatin Calcium) .Marland Kitchen.. 1 By Mouth Daily 6)  Aspirin Ec 325 Mg Tbec (Aspirin) .... Take One Tablet By Mouth Daily 7)  Metoprolol Tartrate 50 Mg  Tabs (Metoprolol Tartrate) .Marland Kitchen.. 1 By Mouth Twice A Day 8)  Proair Hfa 108 (90 Base) Mcg/act  Aers (Albuterol Sulfate) .... 2 Puffs Q 4 Hours As Needed 9)  Advair Diskus 250-50 Mcg/dose  Misc (Fluticasone-Salmeterol) .Marland Kitchen.. 1 Puff Twice A Day As Needed 10)  Onetouch Ultra Test  Strp (Glucose Blood) .... Check Blood Sugar Two Times A Day As Needed 11)  Onetouch Ultra System W/device Kit (Blood Glucose Monitoring Suppl) .... As Directed 250.00 12)  Norvasc 10 Mg Tabs (Amlodipine Besylate) .Marland Kitchen.. 1 By Mouth Once Daily 13)  Vasotec 5 Mg Tabs (Enalapril Maleate) .Marland Kitchen.. 1 By Mouth Once Daily 14)   Isosorbide Mononitrate Cr 60 Mg Xr24h-Tab (Isosorbide Mononitrate) .... Take One Tablet By Mouth Twice A Day  Allergies: 1)  ! Phenergan 2)  ! Glucophage  Past History:  Past Medical History: 1. Anemia-iron deficiency 2. Asthma 3. Coronary artery disease: Unstable angina in 1997 with PTCA of large D1.  Myoview (9/10) at [facility] cardiology showed EF 84%, stable perfusion images with no ischemia.  4. Diabetes mellitus, type II 5. GERD 6. Hypertension 7. Hypothyroidism 8. Pneumonia, hx of 9. CKD: creatinine 1.6 in 6/11 10. Chronic LBBB 11. Aortic stenosis (mild): Echo (4/09) with EF 55-65%, mild aortic stenosis and aortic insufficiency, mild MR.  12. Carotid stenosis: Carotid dopplers [id number] with [id number] LICA stenosis.   renal-- Dr Marval Regal  Family History: Noncontributory  Social History: non smoker  widowed, lives with son between [place] and [place] reviewed and negative except as per HPI.   Vital Signs:  Patient profile:   76 year old female Height:      62 inches Weight:      111 pounds BMI:     20.38 Pulse rate:   61 / minute Pulse rhythm:   irregular BP sitting:   138 / 80  (left arm) Cuff size:   regular  Vitals Entered By: Julaine Hua, CMA (November 03, 2009 3:26 PM)  Physical Exam  General:  Well developed, well nourished, in no acute distress. Head:  normocephalic and atraumatic Nose:  no deformity, discharge, inflammation, or lesions Mouth:  Teeth, gums and palate normal. Oral mucosa normal. Neck:  Neck supple, no JVD. No masses, thyromegaly or abnormal cervical nodes. Lungs:  Clear bilaterally to auscultation and percussion. Heart:  Non-displaced PMI, chest non-tender; regular rate and rhythm, S1, S2 without rubs or gallops. 3/6 systolic crescendo-decrescendo murmur RUSB with clearly heard S2.  Carotid upstroke normal, no bruit.  Pedals normal pulses. No edema, no varicosities. Abdomen:  Bowel sounds  positive; abdomen soft and non-tender without masses, organomegaly, or hernias noted. No hepatosplenomegaly. Extremities:  No clubbing or cyanosis. Neurologic:  Alert and oriented x 3. Skin:  Intact without lesions or rashes. Psych:  Normal affect.   Impression & Recommendations:  Problem # 1:  CORONARY ARTERY DISEASE (ICD-414.00) Patient had unstable angina in 1997 with PTCA to a large 1st diagonal.  No cardiac intervention since that time and no ACS since that time.  Last myoview in 2010 was nonischemia.  No ischemic symptoms currently.  Continue ASA, Lipitor, metoprolol, ACEI, and Imdur.   Problem # 2:  AORTIC STENOSIS (ICD-424.1) Loud murmur but mild AS in 2009.  S2 is clearly heard. Suspect AS is no more than moderate.  Will get followup echo to assess AS.    Problem # 3:  HYPERCHOLESTEROLEMIA, PURE (ICD-272.0) Lipids at goal when last checked.   Other Orders: Echocardiogram (Echo)  Patient Instructions: 1)  Your physician has recommended you make the following change in your medication:  2)  Take Enalapril 2.5mg  twice a day--you can take one-half of a 5mg  tablet twice a day. 3)  Your physician has requested that you have an echocardiogram.  Echocardiography is a painless test that uses sound waves to create images of your heart. It provides your doctor with information about the size and shape of your heart and how well your heart's chambers and valves are working.  This procedure takes approximately one hour. There are no restrictions for this procedure. 4)  Your physician wants you to follow-up in: 6 months with Dr Aundra Dubin.  You will receive a reminder letter in the mail two months in advance. If you don't receive a letter, please call our office to schedule the follow-up appointment. Prescriptions: ENALAPRIL MALEATE 2.5 MG TABS (ENALAPRIL MALEATE) one twice a day  #60 x 11   Entered by:   Desiree Lucy, RN, BSN   Authorized by:   Loralie Champagne, MD   Signed by:   Desiree Lucy,  RN, BSN on 11/03/2009   Method used:   Electronically to        [facility]  [address] [id number] (retail)       [address]       [place], [place]  [address]       Ph: [phone]       Fax: [phone]   RxID:   [id number]

## 2010-04-21 NOTE — Progress Notes (Signed)
Summary: test results  Phone Note Call from Patient Call back at Home Phone [phone]   Caller: Patient Reason for Call: Talk to Nurse, Lab or Test Results Details for Reason: echo Initial call taken by: Neil Crouch,  November 24, 2009 9:47 AM  Follow-up for Phone Call        RN reviewed Echo results. Pt verbalizes understanding.  Follow-up by: Howell Pringle, RN, BSN,  November 24, 2009 9:57 AM

## 2010-04-22 NOTE — Letter (Signed)
Summary: Dr.John Tysinger,[facility],Cardiology Record  [facility],Cardiology Records   Imported By: Virgia Land 11/30/2009 14:46:10  _____________________________________________________________________  External Attachment:    Type:   Image     Comment:   External Document

## 2010-04-22 NOTE — Progress Notes (Signed)
Summary: [facility] Office Visit Note   [facility] Office Visit Note   Imported By: Sallee Provencal 11/17/2009 15:58:30  _____________________________________________________________________  External Attachment:    Type:   Image     Comment:   External Document

## 2010-04-25 ENCOUNTER — Encounter: Payer: Self-pay | Admitting: Family Medicine

## 2010-05-11 NOTE — Consult Note (Signed)
Summary: Annalee Genta Exam,Note  [name] Miller,O.D.,Diabetic Exam,Note   Imported By: Virgia Land 05/03/2010 16:22:31  _____________________________________________________________________  External Attachment:    Type:   Image     Comment:   External Document  Appended Document: Annalee Genta Exam,Note    Clinical Lists Changes  Observations: Added new observation of DMEYEEXAMNXT: 04/2011 (05/07/2010 16:52) Added new observation of DMEYEEXMRES: normal (04/25/2010 16:53) Added new observation of EYE EXAM BY: Dr Sabra Heck (04/25/2010 16:53) Added new observation of DIAB EYE EX: normal (04/25/2010 16:53)        Diabetes Management Exam:    Eye Exam:       Eye Exam done elsewhere          Date: 04/25/2010          Results: normal          Done by: Dr Sabra Heck

## 2010-06-02 ENCOUNTER — Encounter: Payer: Self-pay | Admitting: Family Medicine

## 2010-06-02 LAB — HM MAMMOGRAPHY: HM Mammogram: NORMAL

## 2010-06-06 ENCOUNTER — Encounter: Payer: Self-pay | Admitting: Family Medicine

## 2010-06-16 NOTE — Letter (Signed)
Summary: Results Follow up Letter   at [facility]  [address] [place], [place] [address]   Phone: [phone]  Fax: [phone]    06/06/2010 MRN: [id number]    [address] [place], [place]  [address]    Dear Ms. EVERETT,  The following are the results of your recent test(s):  Test         Result    Pap Smear:        Normal _____  Not Normal _____ Comments: ______________________________________________________ Cholesterol: LDL(Bad cholesterol):         Your goal is less than:         HDL (Good cholesterol):       Your goal is more than: Comments:  ______________________________________________________ Mammogram:        Normal _X____  Not Normal _____ Comments:Repeat in one year.   ___________________________________________________________________ Hemoccult:        Normal _____  Not normal _______ Comments:    _____________________________________________________________________ Other Tests:    We routinely do not discuss normal results over the telephone.  If you desire a copy of the results, or you have any questions about this information we can discuss them at your next office visit.   Sincerely,    Roque Lias Tower,MD  MT/ri

## 2010-06-30 ENCOUNTER — Ambulatory Visit: Payer: Self-pay | Admitting: Family Medicine

## 2010-07-06 ENCOUNTER — Other Ambulatory Visit: Payer: Self-pay | Admitting: Family Medicine

## 2010-07-18 ENCOUNTER — Encounter: Payer: Self-pay | Admitting: Family Medicine

## 2010-07-19 ENCOUNTER — Encounter: Payer: Self-pay | Admitting: Family Medicine

## 2010-07-19 ENCOUNTER — Ambulatory Visit (INDEPENDENT_AMBULATORY_CARE_PROVIDER_SITE_OTHER): Payer: MEDICARE | Admitting: Family Medicine

## 2010-07-19 VITALS — BP 130/64 | HR 52 | Temp 97.4°F | Ht 62.0 in | Wt 109.8 lb

## 2010-07-19 DIAGNOSIS — D485 Neoplasm of uncertain behavior of skin: Secondary | ICD-10-CM

## 2010-07-19 NOTE — Progress Notes (Signed)
  Subjective:    Patient ID: Barbara Garner, female    DOB: Aug 26, 1934, 76 y.o.   MRN: [id number]  HPI Has a growth on mid chest - started as scaley area then grew up to a point Gets hung on things and now crusty and painful to the touch No pus No fever  No all to numbiing meds or iodine  Wants it removed today     Review of Systems Review of Systems  Constitutional: Negative for fever, appetite change, fatigue and unexpected weight change.  Eyes: Negative for pain and visual disturbance.  Respiratory: Negative for cough and shortness of breath.   Cardiovascular: Negative.   Gastrointestinal: Negative for nausea, diarrhea and constipation.  Genitourinary: Negative for urgency and frequency.  Skin: Negative for pallor.  Neurological: Negative for weakness, light-headedness, numbness and headaches.  Hematological: Negative for adenopathy. Does not bruise/bleed easily.  Psychiatric/Behavioral: Negative for dysphoric mood. The patient is not nervous/anxious.           Objective:   Physical Exam  Constitutional: She appears well-developed and well-nourished. No distress.  HENT:  Head: Normocephalic.  Neck: Normal range of motion. Neck supple. No JVD present. No thyromegaly present.  Cardiovascular: Normal rate, regular rhythm and normal heart sounds.   Lymphadenopathy:    She has no cervical adenopathy.  Neurological: She is alert. She has normal reflexes.  Skin: Skin is warm and dry.       3-4 mm keratotic lesion on mid chest that has red base and is tender to the touch No bleeding or pus  Psychiatric: She has a normal mood and affect.          Assessment & Plan:

## 2010-07-19 NOTE — Assessment & Plan Note (Addendum)
Removed with sterile shave technique Area anes with 1% xylocane with epi Prepped and draped with sterile technique  Is keratotic  Sent to path  Aftercare discussed

## 2010-07-19 NOTE — Patient Instructions (Signed)
Keep area on chest clean with antibacterial soap and water  Then pat dry Triple antibiotic ointment and bandaid until healed Update me if redness/ pus or signs of infection I will update you with pathology report when it returns

## 2010-07-20 ENCOUNTER — Encounter: Payer: Self-pay | Admitting: Family Medicine

## 2010-07-21 ENCOUNTER — Telehealth: Payer: Self-pay | Admitting: Family Medicine

## 2010-07-21 DIAGNOSIS — C4492 Squamous cell carcinoma of skin, unspecified: Secondary | ICD-10-CM | POA: Insufficient documentation

## 2010-07-21 NOTE — Telephone Encounter (Signed)
Message copied by Loura Pardon on Thu Jul 21, 2010  6:51 PM ------      Message from: Ozzie Hoyle      Created: Thu Jul 21, 2010  4:03 PM      Regarding: Dermatology referral       Please see result note.      ----- Message -----         From: Loura Pardon, MD         Sent: 07/21/2010   3:34 PM           To: Jess Barters, LPN            Please let pt know that her bx result showed a type of skin cancer (squamous cell carcinoma) -- it needs further eval by a dermatologist and possibly to remove some more of it       Please ask her if she has a dermatologist in mind - [facility] or [facility] so I can do a ref

## 2010-07-21 NOTE — Telephone Encounter (Signed)
I will do derm ref for [facility]

## 2010-07-23 ENCOUNTER — Other Ambulatory Visit: Payer: Self-pay | Admitting: Family Medicine

## 2010-07-25 NOTE — Telephone Encounter (Signed)
Barbara Garner Appt made with Lennie Odor on 08/01/2010 at 9:50am. [facility]

## 2010-08-01 ENCOUNTER — Other Ambulatory Visit: Payer: Self-pay | Admitting: Surgery

## 2010-08-05 NOTE — H&P (Signed)
NAME:  Barbara Garner, Barbara Garner                 ACCOUNT NO.:  [id number]   MEDICAL RECORD NO.:  [id number]          PATIENT TYPE:  INP   LOCATION:  [id number]                         FACILITY:  [facility]   PHYSICIAN:  Colon Branch, MD [facility]    DATE OF BIRTH:  [birth year]   DATE OF ADMISSION:  09/24/2005  DATE OF DISCHARGE:                                HISTORY & PHYSICAL   ADDENDUM TO HISTORY AND PHYSICAL:  The repeated potassium is back and it is  still very high at 6.7 with a blood sugar of 115.   I also had the opportunity to review the EKG with one of the cardiologists  and because of the presence of LBBB it is hard to assess the T waves.   My plan is to repeat D50 bolus and give her 10 units of insulin  intravenously.  She is already on IV fluids with insulin sliding scale,  however, will consider an insulin drip. She also got an additional dose of  Kayexalate 50 gm.  Will follow up closely.      Colon Branch, MD [place]  Electronically Signed     JEP/MEDQ  D:  09/24/2005  T:  09/24/2005  Job:  [id number]

## 2010-08-05 NOTE — Op Note (Signed)
Barbara Garner, Barbara Garner                 ACCOUNT NO.:  [id number]   MEDICAL RECORD NO.:  [id number]          PATIENT TYPE:  INP   LOCATION:  2314                         FACILITY:  [facility]   PHYSICIAN:  Waverly Ferrari, M.D.    DATE OF BIRTH:  1934-03-28   DATE OF PROCEDURE:  09/26/2005  DATE OF DISCHARGE:                                 OPERATIVE REPORT   PROCEDURE:  Upper endoscopy.   INDICATIONS:  Abdominal discomfort, nausea.   ANESTHESIA:  Fentanyl 40 mcg, Versed 4 mg.   PROCEDURE:  With the patient mildly sedated in the left lateral decubitus  position, the Olympus videoscopic endoscope was inserted in the mouth,  passed under direct vision through the esophagus which appeared normal, into  the stomach.  Fundus, body, antrum, duodenal bulb, and second portion  duodenum appeared normal.  From this point, the endoscope was slowly  withdrawn taking circumferential views of duodenal mucosa until the  endoscope had been pulled back into the stomach and placed in retroflexion  to view the stomach from below.  The endoscope was straightened and  withdrawn taking circumferential views of the remaining gastric and  esophageal mucosa.  The patient's vital signs and pulse oximeter remained  stable.  The patient tolerated the procedure well and without apparent  complications.   FINDINGS:  Rather unremarkable examination.   ASSESSMENT AND PLAN:  The patient states her nausea is better.  Will advance  diet as tolerated and follow clinically.           ______________________________  Waverly Ferrari, M.D.     GMO/MEDQ  D:  09/26/2005  T:  09/26/2005  Job:  [id number]

## 2010-08-05 NOTE — Op Note (Signed)
[name], Barbara Garner                 ACCOUNT NO.:  [id number]   MEDICAL RECORD NO.:  [id number]          PATIENT TYPE:  AMB   LOCATION:  ENDO                         FACILITY:  [facility]   PHYSICIAN:  Waverly Ferrari, M.D.    DATE OF BIRTH:  [birth year]   DATE OF PROCEDURE:  07/05/2004  DATE OF DISCHARGE:                                 OPERATIVE REPORT   PROCEDURE:  Colonoscopy.   ENDOSCOPIST:  Waverly Ferrari, M.D.   INDICATIONS FOR PROCEDURE:  History of colon polyps.   ANESTHESIA:  Demerol 60 mg, Versed 5 mg.   DESCRIPTION OF PROCEDURE:  With the patient mildly sedated in the left  lateral decubitus position, the Olympus videoscopic colonoscope was inserted  in the rectum and passed under direct vision to the cecum, identified by the  ileocecal valve and appendiceal orifice, both of which were photographed.  From this point the colonoscope was slowly withdrawn, taking circumferential  views of the colonic mucosa, stopping in the rectum which appeared normal.  The rectum showed hemorrhoids on the retroflexed view.  The endoscope was  straightened and withdrawn.  The patient's vital signs and pulse oximeter  remained stable.   The patient tolerated the procedure well without apparent complications.   FINDINGS:  Internal hemorrhoids, otherwise unremarkable examination.   PLAN:  Will consider a repeat examination in five years.      GMO/MEDQ  D:  07/05/2004  T:  07/05/2004  Job:  [id number]   cc:   Marne A. Glori Bickers, M.D. [facility]

## 2010-08-05 NOTE — Op Note (Signed)
[place]  Patient:    Barbara Garner, Barbara Garner                        MRN: [id number] Proc. Date: 05/12/99 Adm. Date:  [id number] Attending:  Jim Desanctis                           Operative Report  There is no dictation on this patient. DD:  05/12/99 TD:  05/12/99 Job: [id number]

## 2010-08-05 NOTE — Procedures (Signed)
[place]  Patient:    FLORIDA, MIZRAHI                        MRN: [id number] Proc. Date: 05/12/99 Adm. Date:  [id number] Attending:  Jim Desanctis CC:         Loura Pardon, M.D. [facility]                           Procedure Report  PROCEDURE PERFORMED:  Colonoscopy.  ENDOSCOPIST:  Jim Desanctis, M.D.  INDICATIONS FOR PROCEDURE:  Hemoccult positivity, anemia.  ANESTHESIA:  Versed 1 mg given.  DESCRIPTION OF PROCEDURE:  With the patient mildly sedated in the left lateral decubitus position, the Olympus videoscopic pediatric colonoscope was inserted n the rectum and passed under direct vision into the cecum.  The cecum was identified by the ileocecal valve and appendiceal orifice, both of which were photographed. We entered into the terminal ileum via the ileocecal valve.  This, too, appeared normal and was photographed.  From this point, the colonoscope was slowly withdrawn, taking circumferential views of the entire colonic mucosa, stopping o photograph a rare diverticulum seen in the sigmoid colon and a polyp seen at approximately 15 cm from the anal verge.  At this point we used hot biopsy forceps technique, setting of 20/20 blended current to remove this polyp and we had good hemostasis.  The tissue was retrieved and sent for pathology.  The endoscope was then withdrawn to the rectum, which appeared normal on direct view and showed large internal hemorrhoids on retroflex view.  The endoscope was straightened and withdrawn.  Patients vital signs and pulse oximeter remained stable.  The patient tolerated the procedure well and without apparent complications.  FINDINGS:  Slightly suboptimal prep but negative examination other than rare diverticulum, large internal hemorrhoids and small polyp at 15 cm.  Await biopsy report.  Patient will call for results.  Otherwise negative exam to the terminal ileum.  PLAN:  See biopsy reports.  Have patient  follow up with me as described at endoscopy note previously dictated.  DD:  05/12/99 TD:  05/12/99 Job: [id number]

## 2010-08-05 NOTE — H&P (Signed)
NAME:  DARSEY, DIFFIN NO.:  [id number]   MEDICAL RECORD NO.:  [id number]          PATIENT TYPE:  EMS   LOCATION:  MAJO                         FACILITY:  [facility]   PHYSICIAN:  Colon Branch, MD [facility]    DATE OF BIRTH:  [birth year]   DATE OF ADMISSION:  09/24/2005  DATE OF DISCHARGE:                                HISTORY & PHYSICAL   PRIMARY CARE PHYSICIAN:  Marne A. Tower, M.D.   CHIEF COMPLAINT:  Weakness.   HISTORY OF PRESENT ILLNESS:  Mrs. Barbara Garner is a 76 year old white female who  presented to the emergency room with 4 week history of nausea. The nausea is  worse after she eats. The symptoms have increased over the last 2 weeks and  are acutely worse in the last 2 days. She reports weakness and admits that  she has not been eating or drinking well for the last 4 weeks. She denies  any vomiting to me. In the emergency room, she was found to be in renal  failure and having a potassium of 7, so she received IV calcium as well as  IV D5 and insulin.   PAST MEDICAL HISTORY:  1.  Asthma.  2.  Coronary artery disease status post myocardial infarction x3. For      regular cardiology, sees Dr. Glade Lloyd. She also carries the diagnosis of      congestive heart failure but she was unaware of that.  3.  Diabetes.  4.  Hypertension.  5.  High cholesterol.  6.  Hypothyroidism.  7.  History of anemia with positive Hemoccult's. Her gastroenterologist is      Dr. Lajoyce Corners. I reviewed an E-chart. She had an upper endoscopy in 2001 that      showed a watermelon in the stomach. Biopsies showed severe gastritis      with some H-pylori organism. A repeat EGD was done in 2004, which showed      gastritis. Also, she had a colonoscopy in 2001 and 2006.  8.  Hysterectomy without oophorectomy.   SOCIAL HISTORY:  Does not smoke or drink.   FAMILY HISTORY:  Noncontributory.   REVIEW OF SYSTEMS:  She denies any abdominal pain but admits to some  churning in the stomach. Bowel movements are  normal daily. She has  occasional diarrhea, which is ongoing but no blood in the stools. She denies  the use of any NSAID'S at the present time except for her aspirin daily. She  also denied any over-the-counter medicines. She is taking no new  medications. Denies any fever, chills, chest pain, lower extremity edema.  She admits to have occasional shortness of breath for all her life.   MEDICATIONS:  1.  Albuterol p.r.n.  2.  Aspirin 325 daily.  3.  Glipizide ER 10 1 p.o. daily.  4.  Isosorbide mono 60 mg 1 p.o. daily.  5.  Lasix 40 1 p.o. daily.  6.  Lipitor 40 1 p.o. daily.  7.  Lisinopril 40 1 p.o. daily.  8.  Magnesium 1 pill daily.  9.  Metformin 500 1 p.o.  b.i.d.  10. Metoprolol 50 1 p.o. daily.  11. Potassium 1 pill daily.  12. Omeprazole 20 1 p.o. daily.  13. Synthroid 50 1 p.o. daily.   ALLERGIES:  PHENERGAN.   PHYSICAL EXAMINATION:  GENERAL:  The patient is alert, oriented, and in no  apparent distress. She is slightly pale but nonicteric.  VITAL SIGNS:  Afebrile. Pulse 74, blood pressure 146/70, respiratory rate  26, O2 sat 94% on room air.  NECK:  No JVD. Oropharynx is slightly dry.  LUNGS:  Clear bilaterally.  CARDIOVASCULAR:  Regular rate and rhythm. She has a systolic murmur.  ABDOMEN:  Is not distended. Good bowel sounds. No organomegaly.  EXTREMITIES:  No edema.  NEUROLOGIC:  Examination is nonfocal.   LABORATORY DATA:  On admission, blood sugar was 213. Repeated blood sugar  was 132. Sodium 135, potassium initially read as more than 7.5, repeated was  7. BUN 112, creatinine 3.5, chloride 102, CO2 25. AST, ALT, and alkaline  phosphatase are normal. Blood count is 8.4 with a hemoglobin of 10.3 and  platelets 312,000. Urinalysis is negative except for positive esterase.  White blood cells in the urine 0 to 2.   ASSESSMENT/PLAN:  1.  The patient is admitted with severe hyperkalemia. Her EKG showed peak T-      waves. This is likely a combination of decreased  p.o. intake and      continued use of Lasix, ace inhibitors, and potassium supplements. She      already got calcium IV, IV fluids with Dextrose, and insulin. She will      be admitted to the ICU for hydration, insulin therapy, and we will      repeat the STAT potassium and EKG.  2.  The patient has renal failure. No history of previous renal disease to      the patient's knowledge. Urinalysis is negative. This is most likely pre-      renal. Will check urine sodium and urine osmolality and recheck the      renal function after hydration.  3.  Nausea. I think this is what is triggering the patient's problem. Will      get an ultrasound to rule out gallbladder disease. I already called GI      for further evaluation. She may need to have repeated EGD.  4.  Diabetes. Will hydrate with normal saline, D5, with close monitor of her      blood sugars.  5.  Hypertension. Will stop Lasix, ace inhibitors, and the potassium      supplements.  6.  History of coronary artery disease. Will hold the aspirin at this time.      She is asymptomatic.  7.  History of anemia for a few years. Previous workup noted.  8.  History of congestive heart failure.  Will check a BNP and also an      echocardiogram to assess her ejection      fraction.  9.  History of hypothyroidism. Will continue with home medications.  10. History of high cholesterol. Will hold the Lipitor at this time.      Colon Branch, MD [place]  Electronically Signed     JEP/MEDQ  D:  09/24/2005  T:  09/24/2005  Job:  [id number]   cc:   Marne A. Tower, M.D. [facility]  [address], [place]  [place] [address]

## 2010-08-05 NOTE — Discharge Summary (Signed)
NAMEGARNER, ZEPPIERI                 ACCOUNT NO.:  [id number]   MEDICAL RECORD NO.:  [id number]          PATIENT TYPE:  INP   LOCATION:  5705                         FACILITY:  [facility]   PHYSICIAN:  Gwendolyn Grant, M.D. LHCDATE OF BIRTH:  1934-04-28   DATE OF ADMISSION:  09/24/2005  DATE OF DISCHARGE:  09/27/2005                                 DISCHARGE SUMMARY   DISCHARGE DIAGNOSIS:  1.  Nausea, anorexia, abdominal pain likely secondary to uremia.  See      problem number next.  No GI source identified.  1.  Acute renal insufficiency, question dehydration plus or minus      nephrotoxic drugs and over diuresis, improved.  Discharge creatinine      1.7, previous baseline of 1.23 March 2005.  2.  Hyperkalemia secondary to acute renal disease and potassium      supplementation, admission 7.5 resolved to 4.6 on day of discharge.  3.  History of coronary disease status post myocardial infarction x3.      Continue medical management.  Cardiac followup with Dr. Glade Lloyd as      needed.  4.  History of hypertension with hypotension noted this hospitalization due      to dehydration and volume status depletion. Changes in medications as      noted below.  5.  Dyslipidemia.  6.  Hypothyroidism.  7.  Anemia not otherwise specified. Normal iron, B12 and ferritin. See      below. No source of acute or chronic blood loss identified.  Hemoglobin      at discharge 9.1  8.  Type 2 diabetes.   CONSULTATION THIS HOSPITALIZATION:  Include GI, Dr. Lajoyce Corners.   PROCEDURES:  Included EGD done July 10 negative for acute findings of  chronic disease.  No explanation for anemia or nausea and pain symptoms.   CONDITION ON DISCHARGE:  Medically improved.  Tolerating p.o.  Family still  concerned with patient longstanding anorexia.   HOSPITAL FOLLOWUP:  Will be scheduled with primary care physician, Dr. Loura Pardon next week for Wednesday, July 18 at 11:00 a.m.   DISCHARGE MEDICATIONS:  Include the  temporary discontinuation of the  following medications.  Lasix, isosorbide potassium, metformin, and  Lisinopril.  We have changed metoprolol from  50 mg once daily to 1/2 tablet  daily (25 mg metoprolol daily).  Other medications are to resume as prior to  admission including:  1.  Aspirin 325 mg once daily.  2.  Glipizide ER 10 mg once daily.  3.  Lipitor 40 mg once daily.  4.  Omeprazole 20 mg once daily.  5.  Synthroid 50 mcg once daily.   HOSPITAL COURSE BY PROBLEM.:  #1. ANOREXIA NAUSEA OF ACUTE RENAL FAILURE: The patient is a 76 year old  woman with current subacute complaints of anorexia with acutely worsened  nausea and abdominal pain 2 days prior to admission because of intolerance  of any medications. She came to the emergency room due to weakness for  laboratory evaluation which showed her to be significantly hyperkalemic with  potassium of greater than 7.5 and  acute renal insufficiency with a BUN of  112 and creatinine of 3.5. Lab review showed a previous creatinine to be 1.4  in January of this year, and it was felt that uremia from acute renal  insufficiency was playing a large role in her symptoms, likely caused  decreased p.o. intake with continued diuresis on Lasix and other Renal toxic  medications including ACE inhibitor, NSAIDs, metformin, and hypotension  causing the renal insufficiency. These medications were held and patient was  treated with IV fluid as well as aggressive management of her hyperkalemia  with calcium carbonate, insulin and Kayexalate, and potassium resolved in 24  hours without complications or cardiac abnormalities.  Her creatinine  continued to improve with hydration to a level of 1.7 at time of discharge.  Will continue to hold multiple medications until her renal insufficiency is  stabilized back to baseline with close outpatient followup at her primary  MD. This is an discussed with the patient as well as Dr. Glori Bickers, and arranged  to follow  up next week as listed above   #2.  NAUSEA, LIKELY DUE TO UREMIC SYMPTOMS: A GI consult was obtained, and  EGD was performed this hospitalization which showed no acute findings.  It  was Dr. Shan Levans opinion that anorexia and nausea were likely due to renal  insufficiency and have now resolved at the time of this dictation and  discharge. Outpatient followup should symptoms persist   #3.  ANEMIA:  The patient's normocytic anemia at level of 10.3 on admission.  With hydration, this level dropped 9.1.  There is no evidence of acute blood  loss with GI abnormality. Ferritin level was 37, and B12 was normal at 781.  Outpatient followup for further workup should this be needed.   #4.  OTHER MEDICAL ISSUES:  The patient's other medical issues are as listed  above.  No changes have been made except as listed. The patient is  encouraged to bring the Discharge Summary instructions to her primary care  followup next week and call for other problems in the meanwhile.      Gwendolyn Grant, M.D. [facility]  Electronically Signed     VL/MEDQ  D:  09/27/2005  T:  09/27/2005  Job:  [id number]

## 2010-08-05 NOTE — Procedures (Signed)
[place]  Patient:    Barbara Garner, Barbara Garner                        MRN: [id number] Proc. Date: 05/12/99 Adm. Date:  [id number] Attending:  Jim Desanctis CC:         Loura Pardon, M.D. [facility]                           Procedure Report  PROCEDURE:  Upper endoscopy.  ENDOSCOPIST:  Jim Desanctis, M.D.  INDICATIONS:  Anemia with positive stools.  ANESTHESIA:  Demerol 50 mg, Versed 4 mg given intravenously in divided dose.  DESCRIPTION OF PROCEDURE:  With the patient mildly sedated in the left lateral decubitus position, the Olympus videoscopic endoscope was inserted into the mouth and passed under direct vision through the esophagus which appeared normal, into the stomach.   Immediately upon entering the stomach, we saw bands of erythema radiating radially out, even into the fundus of the stomach.  We photographed this. We advanced to the antrum where this was again seen, reminiscent of "watermelon  stomach," a vascular ectasia of the stomach, associated with anemia.  We advanced into the duodenal bulb and the second portion of the duodenum.  Both were viewed and appeared normal.  From this point the endoscope was slowly withdrawn, taking circumferential views of the entire duodenal mucosa.  The endoscope was then pulled back into the stomach and placed on retroflexion to view the stomach from below. This showed a hiatal hernia, as evidenced by a complete wrap of the GE junction  around the endoscope.  The endoscope was then straightened and pulled back from  distal to proximal stomach, after taking biopsies of the erythematous areas. The endoscope was then withdrawn, taking circumferential views of the esophageal mucosa, which otherwise appeared normal.  The patients vital signs and pulse oximeter remained stable.  The patient tolerated the procedure well, without apparent complications.  FINDINGS: 1. Large hiatal hernia. 2.  Endoscopic findings of "watermelon" stomach.  DISPOSITION:  We will await the biopsies.   Will have the patient follow up with me for the results of the biopsies, and as an outpatient. DD:  05/12/99 TD:  05/12/99 Job: [id number]

## 2010-08-05 NOTE — Op Note (Signed)
   NAME:  Barbara Garner, Barbara Garner NO.:  [id number]   MEDICAL RECORD NO.:  [id number]                   PATIENT TYPE:  AMB   LOCATION:  ENDO                                 FACILITY:  [facility]   PHYSICIAN:  Waverly Ferrari, M.D.                 DATE OF BIRTH:  1935/02/13   DATE OF PROCEDURE:  04/22/2002  DATE OF DISCHARGE:                                 OPERATIVE REPORT   PROCEDURE:  Upper endoscopy with biopsy.   INDICATIONS:  Abdominal pain.   ANESTHESIA:  Demerol 40 mg, Versed 4 mg.   INDICATIONS:  As stated, abdominal pain.  See previous clinical note.   DESCRIPTION OF PROCEDURE:  With the patient mildly sedated in the left  lateral decubitus position, the Olympus videoscopic endoscope was inserted  in the mouth, passed under direct vision through the esophagus, which  appeared normal, into the stomach.  The fundus appeared normal.  The body  and antrum showed changes of erythema consistent with localized gastritis.  This was photographed and biopsied.  The duodenal bulb and second portion of  the duodenum appeared normal.  From this point the endoscope was slowly  withdrawn, taking circumferential views of the entire duodenal mucosa  visualized until the endoscope had been pulled back into the stomach, placed  in retroflexion to view the stomach from below.  The endoscope was then  straightened and withdrawn, taking circumferential views of the remaining  gastric and esophageal mucosa.  The patient's vital signs and pulse oximetry  remained stable.  The patient tolerated the procedure well without apparent  complications.   FINDINGS:  1. Changes of gastritis, localized.  2. Splotchy areas of the body and antrum.   PLAN:  Await biopsy report.  The patient will call me for results and follow  up with me as an outpatient.                                                Waverly Ferrari, M.D.    GMO/MEDQ  D:  04/22/2002  T:  04/22/2002  Job:  [id number]   cc:   Marne A. Tower, M.D. [facility]  [address] [place]  [place] [phone]  Fax: 1

## 2010-08-24 ENCOUNTER — Other Ambulatory Visit: Payer: Self-pay | Admitting: Family Medicine

## 2010-08-24 NOTE — Telephone Encounter (Signed)
Medication phoned to [facility] pharmacy as instructed.

## 2010-08-24 NOTE — Telephone Encounter (Signed)
Px written for call in   

## 2010-09-13 ENCOUNTER — Ambulatory Visit (INDEPENDENT_AMBULATORY_CARE_PROVIDER_SITE_OTHER): Payer: MEDICARE | Admitting: Family Medicine

## 2010-09-13 ENCOUNTER — Encounter: Payer: Self-pay | Admitting: Family Medicine

## 2010-09-13 VITALS — BP 140/80 | HR 60 | Temp 98.7°F | Ht 62.0 in | Wt 106.0 lb

## 2010-09-13 DIAGNOSIS — N760 Acute vaginitis: Secondary | ICD-10-CM

## 2010-09-13 MED ORDER — FLUCONAZOLE 150 MG PO TABS: 150.0000 mg | ORAL_TABLET | Freq: Once | ORAL | Status: AC

## 2010-09-13 NOTE — Progress Notes (Signed)
SUBJECTIVE:  76 y.o. female complains of vulvular itching and white vaginal discharge for 3 week(s). Denies abnormal vaginal bleeding or significant pelvic pain or fever. No UTI symptoms. Denies history of known exposure to STD.  No LMP recorded. Patient has had a hysterectomy.  Patient Active Problem List  Diagnoses  . HYPOTHYROIDISM  . DIABETES MELLITUS, TYPE II  . HYPERCHOLESTEROLEMIA, PURE  . ANEMIA-IRON DEFICIENCY  . PERIPHERAL NEUROPATHY  . HYPERTENSION  . MYOCARDIAL INFARCTION, HX OF  . CORONARY ARTERY DISEASE  . AORTIC STENOSIS  . CONGESTIVE HEART FAILURE  . ASTHMA  . GERD  . RENAL FAILURE, ACUTE W/TUBULAR NECROSIS LSN  . MEMORY LOSS  . HEADACHE  . HEART MURMUR, HX OF  . PNEUMONIA, HX OF  . INTERTRIGO, CANDIDAL  . ECZEMA  . Neoplasm of uncertain behavior of skin  . Squamous cell carcinoma of skin   Past Medical History  Diagnosis Date  . Iron deficiency anemia, unspecified   . Aortic valve disorders   . Extrinsic asthma, unspecified   . Congestive heart failure, unspecified   . Coronary atherosclerosis of unspecified type of vessel, native or graft     Unstable angina in 1997 with PTCA of large D1. Myoview (9/10) at [facility] showed EF 84%, stable perfusion images with no ischemia.  . Type II or unspecified type diabetes mellitus without mention of complication, not stated as uncontrolled   . Contact dermatitis and other eczema, due to unspecified cause   . Esophageal reflux   . Headache   . Personal history of unspecified circulatory disease   . Pure hypercholesterolemia   . Unspecified essential hypertension   . Unspecified hypothyroidism   . Other specified erythematous condition   . Memory loss   . Old myocardial infarction   . Other screening mammogram   . Unspecified hereditary and idiopathic peripheral neuropathy   . Personal history of unspecified disease of respiratory system   . Acute kidney failure with lesion of tubular necrosis   . Chronic  kidney disease, unspecified     Cr 1.6 in 6/11  . LBBB (left bundle branch block)   . Carotid stenosis     Carotid dopplers 0000000 with [id number] LICA stenosis   Past Surgical History  Procedure Date  . Ptca [id number]    5 blockages  . Cardiac catheterization   . Esophageal dilation 1997  . Colonoscopy 04/1999    1 polyp  . Esophagogastroduodenoscopy 04/1999    HH; "watermelon stomach" (severe gastritis)  . Dexa 09/2005    Osteopenia  . Cataract extraction 2003    Bilateral  . Esophagogastroduodenoscopy 04/2002    Gastritis  . Cardiovascular stress test 10/2002    normal (per patient)  . Abdominal hysterectomy 1977    partial; fibroids  . Bladder tack 1977  . Dexa 10/2003    osteoporosis  . Colonoscopy 06/2004    Neg. Int hem  . Carotid dopplers 2006  . Admit 09/2005    n/v; renal failure  . Refractive surgery 2006  . Opthy 11/1998;12/01;11/02   History  Substance Use Topics  . Smoking status: Never Smoker   . Smokeless tobacco: Not on file  . Alcohol Use: Not on file   No family history on file. Allergies  Allergen Reactions  . Metformin     REACTION: intolerant  . Promethazine Hcl     REACTION: u/k   Current Outpatient Prescriptions on File Prior to Visit  Medication Sig Dispense Refill  . albuterol (PROAIR HFA)  108 (90 BASE) MCG/ACT inhaler Inhale 2 puffs into the lungs every 4 (four) hours as needed.        Marland Kitchen amLODipine (NORVASC) 10 MG tablet Take 10 mg by mouth daily.        Marland Kitchen aspirin 325 MG EC tablet Take 325 mg by mouth daily.        Marland Kitchen atorvastatin (LIPITOR) 40 MG tablet TAKE 1 TABLET EVERY DAY  30 tablet  11  . Blood Glucose Monitoring Suppl (ONE TOUCH ULTRA SYSTEM KIT) W/DEVICE KIT 1 kit by Does not apply route as directed.        . calcitRIOL (ROCALTROL) 0.25 MCG capsule 1 tablet by mouth on Monday,Wednesday and Friday.      . enalapril (VASOTEC) 2.5 MG tablet Take 2.5 mg by mouth 2 (two) times daily.        Marland Kitchen FeFum-FePo-FA-B Cmp-C-Zn-Mn-Cu (SE-TAN PLUS)  162-115.2-1 MG CAPS Take 1 capsule by mouth daily.        . Fluticasone-Salmeterol (ADVAIR DISKUS) 250-50 MCG/DOSE AEPB Inhale 1 puff into the lungs every 12 (twelve) hours.        . furosemide (LASIX) 20 MG tablet TAKE 1 TABLET EVERY DAY  30 tablet  5  . glipiZIDE (GLUCOTROL) 10 MG 24 hr tablet TAKE 1 TABLET TWICE DAILY  60 tablet  5  . glucose blood test strip 1 each by Other route as directed. Use as instructed       . isosorbide mononitrate (IMDUR) 60 MG 24 hr tablet Take 60 mg by mouth 2 (two) times daily.        . metoprolol (LOPRESSOR) 50 MG tablet TAKE 1 TABLET TWICE DAILY  60 tablet  5  . Nystatin (PEDI-DRI) 100000 UNIT/GM POWD Apply topically as directed.        Marland Kitchen omeprazole (PRILOSEC) 20 MG capsule TAKE 1 CAPSULE EVERY DAY  30 capsule  3  . SYNTHROID 50 MCG tablet TAKE 1 TABLET EVERY DAY  30 tablet  3   The PMH, PSH, Social History, Family History, Medications, and allergies have been reviewed in [facility], and have been updated if relevant.  OBJECTIVE:  BP 140/80  Pulse 60  Temp(Src) 98.7 F (37.1 C) (Oral)  Ht 5\' 2"  (1.575 m)  Wt 106 lb (48.081 kg)  BMI 19.39 kg/m2  She appears well, afebrile. Abdomen: benign, soft, nontender, no masses. Pelvic Exam: normal external genitalia, vulva, vagina, cervix, uterus and adnexa, VULVA: normal appearing vulva with no masses, tenderness or lesions, vulvar erythema, scant white vaginal discharge.   ASSESSMENT:  Wet prep pos for yeast C. albicans vulvovaginitis  PLAN:   Diflucan 150 mg x 1. ROV prn if symptoms persist or worsen.

## 2010-09-26 ENCOUNTER — Telehealth: Payer: Self-pay | Admitting: *Deleted

## 2010-09-26 MED ORDER — FLUCONAZOLE 150 MG PO TABS: 150.0000 mg | ORAL_TABLET | Freq: Once | ORAL | Status: AC

## 2010-09-26 NOTE — Telephone Encounter (Signed)
Patient was seen several weeks ago for a yeast infection. Patient states that she was given a pill to take which helped a lot for a few days.  Patient states that the symptoms started back a few days after taking the pill. Patient states that she is having itching. Patient wants to know if you can call in another pill or does she need to be seen again. Pharmacy/[facility] [address]

## 2010-09-26 NOTE — Telephone Encounter (Signed)
Patient notified as instructed by telephone. Pt has appt to see the dr that did her surgery in 6 weeks, pt thinks she is doing OK but wants to be seen sooner so pt is going to call surgeons office for sooner appt. I told pt if there was anything she needed from [place] to let us know.

## 2010-09-26 NOTE — Telephone Encounter (Signed)
Can repeat the diflucan - I sent to her pharmacy Update me if this does not take care of it -thanks

## 2010-10-29 ENCOUNTER — Other Ambulatory Visit: Payer: Self-pay | Admitting: Family Medicine

## 2010-10-31 NOTE — Telephone Encounter (Signed)
[facility] electronically request refill for Omeprazole 20 mg and Synthroid 50 mcg 30 x 3 refills on each.

## 2010-11-19 ENCOUNTER — Other Ambulatory Visit: Payer: Self-pay | Admitting: Cardiology

## 2010-11-23 ENCOUNTER — Encounter: Payer: Self-pay | Admitting: Family Medicine

## 2010-11-23 ENCOUNTER — Ambulatory Visit (INDEPENDENT_AMBULATORY_CARE_PROVIDER_SITE_OTHER): Payer: MEDICARE | Admitting: Family Medicine

## 2010-11-23 VITALS — BP 124/66 | HR 60 | Temp 97.6°F | Ht 62.0 in | Wt 102.0 lb

## 2010-11-23 DIAGNOSIS — N760 Acute vaginitis: Secondary | ICD-10-CM

## 2010-11-23 LAB — POCT WET PREP (WET MOUNT): KOH Wet Prep POC: POSITIVE

## 2010-11-23 MED ORDER — FLUCONAZOLE 150 MG PO TABS: ORAL_TABLET | ORAL | Status: DC

## 2010-11-23 MED ORDER — TERCONAZOLE 0.8 % VA CREA: TOPICAL_CREAM | VAGINAL | Status: DC

## 2010-11-23 NOTE — Assessment & Plan Note (Signed)
This is recurrent - and in June partially tx with diflucan  Then irritated by anti itch preparations otc  Will tx more aggressively with diflucan times 2 - 3 days apart and also terazol cream externally Re check 1 mo to see if areas of white on labia resolve- if not , may need to consider poss of lichen sclerosis Pt will update before that if symptoms do not improve

## 2010-11-23 NOTE — Progress Notes (Signed)
Subjective:    Patient ID: Barbara Garner, female    DOB: 1934/04/22, 76 y.o.   MRN: [id number]  HPI Yeast infection from the end of June - improved but never dissapeared  Could live with it   Now steadily getting worse again  Vaginal itching - mostly on the outside  No discharge  No low abd pain No bleeding  Last used otc yeast med 1 week - was a cream - and used it internally without applicator -- for vaginal itching but perhaps not for yeast  Helped a little bit -- then caused her to burn more  Tried several different anti itch products  No abd pain   Patient Active Problem List  Diagnoses  . HYPOTHYROIDISM  . DIABETES MELLITUS, TYPE II  . HYPERCHOLESTEROLEMIA, PURE  . ANEMIA-IRON DEFICIENCY  . PERIPHERAL NEUROPATHY  . HYPERTENSION  . MYOCARDIAL INFARCTION, HX OF  . CORONARY ARTERY DISEASE  . AORTIC STENOSIS  . CONGESTIVE HEART FAILURE  . ASTHMA  . GERD  . RENAL FAILURE, ACUTE W/TUBULAR NECROSIS LSN  . MEMORY LOSS  . HEADACHE  . HEART MURMUR, HX OF  . PNEUMONIA, HX OF  . INTERTRIGO, CANDIDAL  . ECZEMA  . Neoplasm of uncertain behavior of skin  . Squamous cell carcinoma of skin  . Vaginitis   Past Medical History  Diagnosis Date  . Iron deficiency anemia, unspecified   . Aortic valve disorders   . Extrinsic asthma, unspecified   . Congestive heart failure, unspecified   . Coronary atherosclerosis of unspecified type of vessel, native or graft     Unstable angina in 1997 with PTCA of large D1. [facility]iew (9/10) [facility] showed EF 84%, stable perfusion images with no ischemia.  . Type II or unspecified type diabetes mellitus without mention of complication, not stated as uncontrolled   . Contact dermatitis and other eczema, due to unspecified cause   . Esophageal reflux   . Headache   . Personal history of unspecified circulatory disease   . Pure hypercholesterolemia   . Unspecified essential hypertension   . Unspecified hypothyroidism   . Other specified  erythematous condition   . Memory loss   . Old myocardial infarction   . Other screening mammogram   . Unspecified hereditary and idiopathic peripheral neuropathy   . Personal history of unspecified disease of respiratory system   . Acute kidney failure with lesion of tubular necrosis   . Chronic kidney disease, unspecified     Cr 1.6 in 6/11  . LBBB (left bundle branch block)   . Carotid stenosis     Carotid dopplers 0000000 with [id number] LICA stenosis   Past Surgical History  Procedure Date  . Ptca [phone]    5 blockages  . Cardiac catheterization   . Esophageal dilation 1997  . Colonoscopy 04/1999    1 polyp  . Esophagogastroduodenoscopy 04/1999    HH; "watermelon stomach" (severe gastritis)  . Dexa 09/2005    Osteopenia  . Cataract extraction 2003    Bilateral  . Esophagogastroduodenoscopy 04/2002    Gastritis  . Cardiovascular stress test 10/2002    normal (per patient)  . Abdominal hysterectomy 1977    partial; fibroids  . Bladder tack 1977  . Dexa 10/2003    osteoporosis  . Colonoscopy 06/2004    Neg. Int hem  . Carotid dopplers 2006  . Admit 09/2005    n/v; renal failure  . Refractive surgery 2006  . Opthy 11/1998;12/01;11/02   History  Substance Use Topics  . Smoking status: Never Smoker   . Smokeless tobacco: Not on file  . Alcohol Use: Not on file   No family history on file. Allergies  Allergen Reactions  . Metformin     REACTION: intolerant  . Promethazine Hcl     REACTION: u/k   Current Outpatient Prescriptions on File Prior to Visit  Medication Sig Dispense Refill  . amLODipine (NORVASC) 10 MG tablet Take 10 mg by mouth daily.        Marland Kitchen aspirin 325 MG EC tablet Take 325 mg by mouth daily.        Marland Kitchen atorvastatin (LIPITOR) 40 MG tablet TAKE 1 TABLET EVERY DAY  30 tablet  11  . Blood Glucose Monitoring Suppl (ONE TOUCH ULTRA SYSTEM KIT) W/DEVICE KIT 1 kit by Does not apply route as directed.        . calcitRIOL (ROCALTROL) 0.25 MCG capsule 1 tablet by mouth  on Monday,Wednesday and Friday.      . enalapril (VASOTEC) 2.5 MG tablet TAKE 1 TABLET TWICE A DAY  60 tablet  3  . FeFum-FePo-FA-B Cmp-C-Zn-Mn-Cu (SE-TAN PLUS) 162-115.2-1 MG CAPS Take 1 capsule by mouth daily.       . furosemide (LASIX) 20 MG tablet TAKE 1 TABLET EVERY DAY  30 tablet  5  . glipiZIDE (GLUCOTROL) 10 MG 24 hr tablet TAKE 1 TABLET TWICE DAILY  60 tablet  5  . glucose blood test strip 1 each by Other route as directed. Use as instructed       . isosorbide mononitrate (IMDUR) 60 MG 24 hr tablet Take 60 mg by mouth 2 (two) times daily.        . metoprolol (LOPRESSOR) 50 MG tablet TAKE 1 TABLET TWICE DAILY  60 tablet  5  . Nystatin (PEDI-DRI) 100000 UNIT/GM POWD Apply topically as directed.        Marland Kitchen SYNTHROID 50 MCG tablet TAKE 1 TABLET EVERY DAY  30 tablet  3  . albuterol (PROAIR HFA) 108 (90 BASE) MCG/ACT inhaler Inhale 2 puffs into the lungs every 4 (four) hours as needed.        . ferrous fumarate-iron polysaccharide complex (TANDEM) 162-115.2 MG CAPS Take 1 capsule by mouth daily with breakfast.        . Fluticasone-Salmeterol (ADVAIR DISKUS) 250-50 MCG/DOSE AEPB Inhale 1 puff into the lungs every 12 (twelve) hours.        . mupirocin (BACTROBAN) 2 % ointment Apply 1 application topically 3 (three) times daily.        Marland Kitchen omeprazole (PRILOSEC) 20 MG capsule TAKE 1 CAPSULE EVERY DAY  30 capsule  3        Review of Systems Review of Systems  Constitutional: Negative for fever, appetite change, fatigue and unexpected weight change.  Eyes: Negative for pain and visual disturbance.  Respiratory: Negative for cough and shortness of breath.   Cardiovascular: Negative for cp or palpitations    Gastrointestinal: Negative for nausea, diarrhea and constipation.  Genitourinary: Negative for urgency and frequency. pos for itching vaginally , neg for vaginal dc Skin: Negative for pallor or rash   Neurological: Negative for weakness, light-headedness, numbness and headaches.    Hematological: Negative for adenopathy. Does not bruise/bleed easily.  Psychiatric/Behavioral: Negative for dysphoric mood. The patient is not nervous/anxious.          Objective:   Physical Exam  Constitutional: She appears well-developed and well-nourished. No distress.  HENT:  Head: Normocephalic and atraumatic.  Mouth/Throat: Oropharynx is clear and moist.  Eyes: Conjunctivae and EOM are normal.  Neck: Normal range of motion. Neck supple.  Cardiovascular: Normal rate, regular rhythm and normal heart sounds.   Pulmonary/Chest: Effort normal and breath sounds normal. No respiratory distress. She has no wheezes.  Abdominal: Soft. Bowel sounds are normal. She exhibits no distension. There is no tenderness.       No suprapubic tenderness   Genitourinary: No vaginal discharge found.       Labia are inflammed but not excoriated  slt redness On anterior labia minora some white discoloration is seen No tenderness No discharge   Lymphadenopathy:    She has no cervical adenopathy.  Skin: Skin is warm and dry. No rash noted. No pallor.  Psychiatric: She has a normal mood and affect.          Assessment & Plan:

## 2010-11-23 NOTE — Patient Instructions (Signed)
Hold your lipitor for one week  Take 1 diflucan today and another one (orally) in 3 days Use the terazol cream externally once daily Follow up with me in 1 month for re check  However- if not improving or worse please

## 2010-12-26 ENCOUNTER — Ambulatory Visit: Payer: MEDICARE | Admitting: Family Medicine

## 2010-12-26 DIAGNOSIS — Z0289 Encounter for other administrative examinations: Secondary | ICD-10-CM

## 2011-01-05 ENCOUNTER — Other Ambulatory Visit: Payer: Self-pay

## 2011-01-05 MED ORDER — ALBUTEROL SULFATE HFA 108 (90 BASE) MCG/ACT IN AERS: 2.0000 | INHALATION_SPRAY | RESPIRATORY_TRACT | Status: DC | PRN

## 2011-01-05 NOTE — Telephone Encounter (Signed)
[facility] faxed refill request Proair HFA 34mcg inhaler.last filled 04/18/10.

## 2011-01-05 NOTE — Telephone Encounter (Signed)
Will refill electronically  

## 2011-01-19 ENCOUNTER — Telehealth: Payer: Self-pay | Admitting: *Deleted

## 2011-01-19 MED ORDER — FLUCONAZOLE 150 MG PO TABS: ORAL_TABLET | ORAL | Status: DC

## 2011-01-19 NOTE — Telephone Encounter (Signed)
Patient advised as instructed via telephone.  She will make an appt to see Dr. Glori Bickers because she stated that the Diflucan hasn't helped in the past.

## 2011-01-19 NOTE — Telephone Encounter (Signed)
Ok to send in rx for diflucan 150 mg po x 1 with no refills. If symptoms persists, needs OV.

## 2011-01-19 NOTE — Telephone Encounter (Signed)
Pt c/o yeast infection, states she has had them several times in the past few months and it has returned. She states seh cannot come in to be seen b/c she just had eyes dilated at [facility] [address] and wants to know if you would just call in rx for her.

## 2011-01-20 ENCOUNTER — Ambulatory Visit (INDEPENDENT_AMBULATORY_CARE_PROVIDER_SITE_OTHER): Payer: MEDICARE | Admitting: Family Medicine

## 2011-01-20 ENCOUNTER — Encounter: Payer: Self-pay | Admitting: Family Medicine

## 2011-01-20 VITALS — BP 100/56 | HR 60 | Temp 97.4°F | Ht 62.0 in | Wt 103.0 lb

## 2011-01-20 DIAGNOSIS — N760 Acute vaginitis: Secondary | ICD-10-CM

## 2011-01-20 MED ORDER — BETAMETHASONE DIPROPIONATE AUG 0.05 % EX CREA: TOPICAL_CREAM | Freq: Two times a day (BID) | CUTANEOUS | Status: DC

## 2011-01-20 NOTE — Patient Instructions (Signed)
Keep area clean and dry  Use the diprolene cream that I sent to the pharmacy up to twice daily  We will do gyn referral at check out

## 2011-01-20 NOTE — Progress Notes (Signed)
Subjective:    Patient ID: Barbara Garner, female    DOB: 05-07-1934, 76 y.o.   MRN: [id number]  HPI Seen early in sept for yeast infx Never really cleared completely - improved some and then got worse again   Is itching  Both inside and outside  No discharge or odor that she can tell No sexual partners  Last time -- gave several doses of diflucan and a cream  (3 doses of diflucan)   This has been going on several months   Patient Active Problem List  Diagnoses  . HYPOTHYROIDISM  . DIABETES MELLITUS, TYPE II  . HYPERCHOLESTEROLEMIA, PURE  . ANEMIA-IRON DEFICIENCY  . PERIPHERAL NEUROPATHY  . HYPERTENSION  . MYOCARDIAL INFARCTION, HX OF  . CORONARY ARTERY DISEASE  . AORTIC STENOSIS  . CONGESTIVE HEART FAILURE  . ASTHMA  . GERD  . RENAL FAILURE, ACUTE W/TUBULAR NECROSIS [facility]  . MEMORY LOSS  . HEADACHE  . HEART MURMUR, HX OF  . PNEUMONIA, HX OF  . INTERTRIGO, CANDIDAL  . ECZEMA  . Neoplasm of uncertain behavior of skin  . Squamous cell carcinoma of skin  . Vaginitis   Past Medical History  Diagnosis Date  . Iron deficiency anemia, unspecified   . Aortic valve disorders   . Extrinsic asthma, unspecified   . Congestive heart failure, unspecified   . Coronary atherosclerosis of unspecified type of vessel, native or graft     Unstable angina in 1997 with PTCA of large D1. [facility]iew (9/10[facility] showed EF 84%, stable perfusion images with no ischemia.  . Type II or unspecified type diabetes mellitus without mention of complication, not stated as uncontrolled   . Contact dermatitis and other eczema, due to unspecified cause   . Esophageal reflux   . Headache   . Personal history of unspecified circulatory disease   . Pure hypercholesterolemia   . Unspecified essential hypertension   . Unspecified hypothyroidism   . Other specified erythematous condition   . Memory loss   . Old myocardial infarction   . Other screening mammogram   . Unspecified hereditary and  idiopathic peripheral neuropathy   . Personal history of unspecified disease of respiratory system   . Acute kidney failure with lesion of tubular necrosis   . Chronic kidney disease, unspecified     Cr 1.6 in 6/11  . LBBB (left bundle branch block)   . Carotid stenosis     Carotid dopplers 0000000 with [id number] LICA stenosis   Past Surgical History  Procedure Date  . Ptca [phone]    5 blockages  . Cardiac catheterization   . Esophageal dilation 1997  . Colonoscopy 04/1999    1 polyp  . Esophagogastroduodenoscopy 04/1999    HH; "watermelon stomach" (severe gastritis)  . Dexa 09/2005    Osteopenia  . Cataract extraction 2003    Bilateral  . Esophagogastroduodenoscopy 04/2002    Gastritis  . Cardiovascular stress test 10/2002    normal (per patient)  . Abdominal hysterectomy 1977    partial; fibroids  . Bladder tack 1977  . Dexa 10/2003    osteoporosis  . Colonoscopy 06/2004    Neg. Int hem  . Carotid dopplers 2006  . Admit 09/2005    n/v; renal failure  . Refractive surgery 2006  . Opthy 11/1998;12/01;11/02   History  Substance Use Topics  . Smoking status: Never Smoker   . Smokeless tobacco: Not on file  . Alcohol Use: Not on file  No family history on file. Allergies  Allergen Reactions  . Metformin     REACTION: intolerant  . Promethazine Hcl     REACTION: u/k   Current Outpatient Prescriptions on File Prior to Visit  Medication Sig Dispense Refill  . amLODipine (NORVASC) 10 MG tablet Take 10 mg by mouth daily.        Marland Kitchen aspirin 325 MG EC tablet Take 325 mg by mouth daily.        Marland Kitchen atorvastatin (LIPITOR) 40 MG tablet TAKE 1 TABLET EVERY DAY  30 tablet  11  . Blood Glucose Monitoring Suppl (ONE TOUCH ULTRA SYSTEM KIT) W/DEVICE KIT 1 kit by Does not apply route as directed.        . calcitRIOL (ROCALTROL) 0.25 MCG capsule 1 tablet by mouth on Monday,Wednesday and Friday.      . enalapril (VASOTEC) 2.5 MG tablet TAKE 1 TABLET TWICE A DAY  60 tablet  3  . FeFum-FePo-FA-B  Cmp-C-Zn-Mn-Cu (SE-TAN PLUS) 162-115.2-1 MG CAPS Take 1 capsule by mouth daily.       . ferrous fumarate-iron polysaccharide complex (TANDEM) 162-115.2 MG CAPS Take 1 capsule by mouth daily with breakfast.        . furosemide (LASIX) 20 MG tablet TAKE 1 TABLET EVERY DAY  30 tablet  5  . glipiZIDE (GLUCOTROL) 10 MG 24 hr tablet TAKE 1 TABLET TWICE DAILY  60 tablet  5  . glucose blood test strip 1 each by Other route as directed. Use as instructed       . isosorbide mononitrate (IMDUR) 60 MG 24 hr tablet Take 60 mg by mouth 2 (two) times daily.        . metoprolol (LOPRESSOR) 50 MG tablet TAKE 1 TABLET TWICE DAILY  60 tablet  5  . SYNTHROID 50 MCG tablet TAKE 1 TABLET EVERY DAY  30 tablet  3  . albuterol (PROAIR HFA) 108 (90 BASE) MCG/ACT inhaler Inhale 2 puffs into the lungs every 4 (four) hours as needed.  1 Inhaler  5  . fluconazole (DIFLUCAN) 150 MG tablet Take one tablet by mouth once  1 tablet  0  . Fluticasone-Salmeterol (ADVAIR DISKUS) 250-50 MCG/DOSE AEPB Inhale 1 puff into the lungs every 12 (twelve) hours.        . mupirocin (BACTROBAN) 2 % ointment Apply 1 application topically 3 (three) times daily.        Marland Kitchen Nystatin (PEDI-DRI) 100000 UNIT/GM POWD Apply topically as directed.        Marland Kitchen omeprazole (PRILOSEC) 20 MG capsule TAKE 1 CAPSULE EVERY DAY  30 capsule  3  . terconazole (TERAZOL 3) 0.8 % vaginal cream Use on external areas that itch daily as needed  20 g  0       Review of Systems Review of Systems  Constitutional: Negative for fever, appetite change, fatigue and unexpected weight change.  Eyes: Negative for pain and visual disturbance.  Respiratory: Negative for cough and shortness of breath.   Cardiovascular: Negative for cp or palpitations    Gastrointestinal: Negative for nausea, diarrhea and constipation.  Genitourinary: Negative for urgency and frequency.pos for vaginal irritation - not discharge   Skin: Negative for pallor or rash   Neurological: Negative for  weakness, light-headedness, numbness and headaches.  Hematological: Negative for adenopathy. Does not bruise/bleed easily.  Psychiatric/Behavioral: Negative for dysphoric mood. The patient is not nervous/anxious.          Objective:   Physical Exam  Constitutional: She appears well-developed  and well-nourished.  HENT:  Head: Normocephalic and atraumatic.  Mouth/Throat: Oropharynx is clear and moist.  Eyes: Conjunctivae and EOM are normal. Pupils are equal, round, and reactive to light.  Neck: No thyromegaly present.  Cardiovascular: Normal rate, regular rhythm and normal heart sounds.   Pulmonary/Chest: Effort normal and breath sounds normal.  Abdominal: Soft. Bowel sounds are normal. She exhibits no distension and no mass. There is no tenderness.       No suprapubic tenderness    Genitourinary: There is erythema around the vagina. No tenderness or bleeding around the vagina. No vaginal discharge found.       Labia minora and majora are diffusely injected with some areas of white plaque and one small area of skin loss/ulceration L lower labia  No d/c Few excoriations   Musculoskeletal: Normal range of motion. She exhibits no edema and no tenderness.  Neurological: She is alert.  Skin: Skin is warm and dry. No rash noted. No erythema. No pallor.  Psychiatric: She has a normal mood and affect.          Assessment & Plan:

## 2011-01-22 LAB — POCT WET PREP (WET MOUNT): Trichomonas Wet Prep HPF POC: NEGATIVE

## 2011-01-22 NOTE — Assessment & Plan Note (Signed)
Ongoing - with some imp after aggressive yeast tx - now more inflammatory looking Wet prep yields wbc - no clue or yeast  Some features of poss lichen sclerosis tx with diprolene Also gyn consult  Update if worse in meantime

## 2011-01-23 ENCOUNTER — Other Ambulatory Visit: Payer: Self-pay

## 2011-01-23 MED ORDER — METOPROLOL TARTRATE 50 MG PO TABS: 50.0000 mg | ORAL_TABLET | Freq: Two times a day (BID) | ORAL | Status: DC

## 2011-01-23 MED ORDER — FUROSEMIDE 20 MG PO TABS: 20.0000 mg | ORAL_TABLET | Freq: Every day | ORAL | Status: DC

## 2011-01-23 MED ORDER — GLIPIZIDE ER 10 MG PO TB24: 10.0000 mg | ORAL_TABLET | Freq: Two times a day (BID) | ORAL | Status: DC

## 2011-01-23 NOTE — Telephone Encounter (Signed)
[facility] faxed refill request Metoprolol tart 50 mg #60 x 3,glipizide ER 10 mg #60 x 3 and furosemide 20 mg #30 x 3.

## 2011-02-01 ENCOUNTER — Ambulatory Visit (INDEPENDENT_AMBULATORY_CARE_PROVIDER_SITE_OTHER): Payer: MEDICARE | Admitting: Obstetrics & Gynecology

## 2011-02-01 ENCOUNTER — Encounter: Payer: Self-pay | Admitting: Obstetrics & Gynecology

## 2011-02-01 VITALS — BP 120/46 | HR 52 | Ht 61.0 in | Wt 101.0 lb

## 2011-02-01 DIAGNOSIS — N952 Postmenopausal atrophic vaginitis: Secondary | ICD-10-CM

## 2011-02-01 MED ORDER — ESTRADIOL 10 MCG VA TABS: ORAL_TABLET | VAGINAL | Status: DC

## 2011-02-01 NOTE — Progress Notes (Signed)
Patient is referred here today by Dr. Glori Bickers for recurring vaginitis that she has treated as yeast and been given a prescription for Betamethasone cream.  She has had this several times and it seems to flare up.  She has had this at least four times.

## 2011-02-01 NOTE — Progress Notes (Signed)
  Subjective:    Patient ID: Barbara Garner, female    DOB: Feb 14, 1935, 76 y.o.   MRN: [id number]  HPI  Barbara Garner is a 76 yo w w with a history of vulvar itching.  She has been using betamethasone cream on her vulva for the itching . A wet prep on 01-22-11 was negative and one prior to that showed yeast.  She has used antifungals also.  She is not sexually active and does not use HRT.  Review of Systems   She catagorically denies urinary incontinece or the wearing of sanitary pads. Objective:   Physical Exam  Her vulva is uniformly  Erythematous and atrophic (resembles a diaper rash or the rash one would see with incontinence.)      Assessment & Plan:  Atrophic vaginitis- I have given her samples of vagifem 10 mcg and told her to use them 3 times per week.  When the samples run out, I e-prescribed the vagifem for 2 times per week. She will come back in 3 weeks.  If there is no dramatic improvement, then I will empirically add monistat or diflucan in spite of a negative wet prep.

## 2011-03-27 ENCOUNTER — Ambulatory Visit: Payer: MEDICARE | Admitting: Obstetrics & Gynecology

## 2011-03-30 ENCOUNTER — Encounter: Payer: Self-pay | Admitting: Obstetrics & Gynecology

## 2011-03-30 ENCOUNTER — Ambulatory Visit (INDEPENDENT_AMBULATORY_CARE_PROVIDER_SITE_OTHER): Payer: MEDICARE | Admitting: Obstetrics & Gynecology

## 2011-03-30 VITALS — BP 117/44 | HR 54 | Ht 62.0 in | Wt 106.0 lb

## 2011-03-30 DIAGNOSIS — N760 Acute vaginitis: Secondary | ICD-10-CM

## 2011-03-30 MED ORDER — TERCONAZOLE 0.4 % VA CREA: 1.0000 | TOPICAL_CREAM | Freq: Every day | VAGINAL | Status: AC

## 2011-03-30 NOTE — Progress Notes (Signed)
  Subjective:    Patient ID: Barbara Garner, female    DOB: 09-Nov-1934, 77 y.o.   MRN: [id number]  HPI Barbara Garner is here for a follow up visit regarding her vaginitis. I had prescribed vagifem 2 times per week, but she has been using it nightly.    Review of Systems     Objective:   Physical Exam   Moderate improvement of vulva/vagina, but a symmetrical 1 cm area of the labia majora looks like a yeast infection.     Assessment & Plan:  She is aware that she needs to only use her Vagifem 2 times per week in order to avoid the increased risk of uterine cancer. I will e-prescribe Terazol 7. RTC 1 month.

## 2011-04-06 ENCOUNTER — Other Ambulatory Visit: Payer: Self-pay | Admitting: Family Medicine

## 2011-04-24 ENCOUNTER — Other Ambulatory Visit: Payer: Self-pay | Admitting: *Deleted

## 2011-04-24 MED ORDER — ISOSORBIDE MONONITRATE ER 60 MG PO TB24: 60.0000 mg | ORAL_TABLET | Freq: Two times a day (BID) | ORAL | Status: DC

## 2011-04-24 NOTE — Telephone Encounter (Signed)
Will refill electronically  

## 2011-04-27 ENCOUNTER — Ambulatory Visit: Payer: MEDICARE | Admitting: Obstetrics & Gynecology

## 2011-05-04 ENCOUNTER — Other Ambulatory Visit: Payer: Self-pay | Admitting: Cardiology

## 2011-05-10 ENCOUNTER — Emergency Department (HOSPITAL_COMMUNITY)
Admission: EM | Admit: 2011-05-10 | Discharge: 2011-05-10 | Disposition: A | Discharge status: Home / Self Care | Payer: MEDICARE | Attending: Emergency Medicine | Admitting: Emergency Medicine

## 2011-05-10 ENCOUNTER — Other Ambulatory Visit: Payer: Self-pay

## 2011-05-10 DIAGNOSIS — E119 Type 2 diabetes mellitus without complications: Secondary | ICD-10-CM | POA: Insufficient documentation

## 2011-05-10 DIAGNOSIS — I252 Old myocardial infarction: Secondary | ICD-10-CM | POA: Insufficient documentation

## 2011-05-10 DIAGNOSIS — I10 Essential (primary) hypertension: Secondary | ICD-10-CM | POA: Insufficient documentation

## 2011-05-10 DIAGNOSIS — Z79899 Other long term (current) drug therapy: Secondary | ICD-10-CM | POA: Insufficient documentation

## 2011-05-10 DIAGNOSIS — R011 Cardiac murmur, unspecified: Secondary | ICD-10-CM | POA: Insufficient documentation

## 2011-05-10 DIAGNOSIS — M62838 Other muscle spasm: Secondary | ICD-10-CM

## 2011-05-10 DIAGNOSIS — I509 Heart failure, unspecified: Secondary | ICD-10-CM | POA: Insufficient documentation

## 2011-05-10 DIAGNOSIS — E7800 Pure hypercholesterolemia, unspecified: Secondary | ICD-10-CM | POA: Insufficient documentation

## 2011-05-10 MED ORDER — KETOROLAC TROMETHAMINE 30 MG/ML IJ SOLN
30.0000 mg | Freq: Once | INTRAMUSCULAR | Status: AC
  Administered 2011-05-10: 30 mg via INTRAMUSCULAR
  Filled 2011-05-10: qty 1

## 2011-05-10 MED ORDER — CYCLOBENZAPRINE HCL 10 MG PO TABS
10.0000 mg | ORAL_TABLET | Freq: Once | ORAL | Status: AC
  Administered 2011-05-10: 10 mg via ORAL
  Filled 2011-05-10: qty 1

## 2011-05-10 MED ORDER — NAPROXEN 500 MG PO TABS: 500.0000 mg | ORAL_TABLET | Freq: Two times a day (BID) | ORAL | Status: DC

## 2011-05-10 MED ORDER — CYCLOBENZAPRINE HCL 10 MG PO TABS: 10.0000 mg | ORAL_TABLET | Freq: Two times a day (BID) | ORAL | Status: AC | PRN

## 2011-05-10 NOTE — Discharge Instructions (Signed)
Please inform your doctor that you may have a new heart murmur. Your EKG is unchanged but this finding needs to be followed up by your cardiologist. Please call in the morning to arrange a followup visit. He should take the medication called Naprosyn twice a day for the next week, he may try the muscle relaxant called Flexeril as prescribed but be aware that this medication may cause some urinary retention. Please return to the emergency department for any chest pain, difficulty breathing or severe or worsening symptoms.

## 2011-05-10 NOTE — ED Provider Notes (Addendum)
History     CSN: [id number]  Arrival date & time 05/10/11  0041   First MD Initiated Contact with Patient 05/10/11 0120      Chief Complaint  Patient presents with  . Neck Pain    neck pain for two days     (Consider location/radiation/quality/duration/timing/severity/associated sxs/prior treatment) HPI  Information acquired from patient. Patient states that she had acute onset of her neck pain was initially mild but has persisted throughout the day. It is the right side of her neck, is persistent, is severe at times, it is made worse with moving her head from side to side and palpation of the right neck. It gets better with rest.  She denies headache, visual change, nausea vomiting shortness of breath chest pain abdominal pain leg swelling or rash.  Past Medical History  Diagnosis Date  . Iron deficiency anemia, unspecified   . Aortic valve disorders   . Extrinsic asthma, unspecified   . Congestive heart failure, unspecified   . Coronary atherosclerosis of unspecified type of vessel, native or graft     Unstable angina in 1997 with PTCA of large D1. [facility]iew (9/10) at [facility] showed EF 84%, stable perfusion images with no ischemia.  . Type II or unspecified type diabetes mellitus without mention of complication, not stated as uncontrolled   . Contact dermatitis and other eczema, due to unspecified cause   . Esophageal reflux   . Headache   . Personal history of unspecified circulatory disease   . Pure hypercholesterolemia   . Unspecified essential hypertension   . Unspecified hypothyroidism   . Other specified erythematous condition   . Memory loss   . Old myocardial infarction   . Other screening mammogram   . Unspecified hereditary and idiopathic peripheral neuropathy   . Personal history of unspecified disease of respiratory system   . Acute kidney failure with lesion of tubular necrosis   . Chronic kidney disease, unspecified     Cr 1.6 in 6/11  . LBBB (left bundle  branch block)   . Carotid stenosis     Carotid dopplers 0000000 with [id number] LICA stenosis    Past Surgical History  Procedure Date  . Ptca [phone]    5 blockages  . Cardiac catheterization   . Esophageal dilation 1997  . Colonoscopy 04/1999    1 polyp  . Esophagogastroduodenoscopy 04/1999    HH; "watermelon stomach" (severe gastritis)  . Dexa 09/2005    Osteopenia  . Cataract extraction 2003    Bilateral  . Esophagogastroduodenoscopy 04/2002    Gastritis  . Cardiovascular stress test 10/2002    normal (per patient)  . Abdominal hysterectomy 1977    partial; fibroids  . Bladder tack 1977  . Dexa 10/2003    osteoporosis  . Colonoscopy 06/2004    Neg. Int hem  . Carotid dopplers 2006  . Admit 09/2005    n/v; renal failure  . Refractive surgery 2006  . Opthy 11/1998;12/01;11/02    No family history on file.  History  Substance Use Topics  . Smoking status: Never Smoker   . Smokeless tobacco: Not on file  . Alcohol Use: No    OB History    Grav Para Term Preterm Abortions TAB SAB Ect Mult Living                  Review of Systems  All other systems reviewed and are negative.    Allergies  Metformin and Promethazine hcl  Home  Medications   Current Outpatient Rx  Name Route Sig Dispense Refill  . ALBUTEROL SULFATE HFA 108 (90 BASE) MCG/ACT IN AERS Inhalation Inhale 2 puffs into the lungs every 4 (four) hours as needed. 1 Inhaler 5  . AMLODIPINE BESYLATE 10 MG PO TABS Oral Take 10 mg by mouth 2 (two) times daily.     . ASPIRIN 325 MG PO TBEC Oral Take 325 mg by mouth daily.      . ATORVASTATIN CALCIUM 40 MG PO TABS  TAKE 1 TABLET EVERY DAY 30 tablet 11  . BETAMETHASONE DIPROPIONATE AUG 0.05 % EX CREA Topical Apply topically 2 (two) times daily. To external vaginal area 30 g 0  . ONETOUCH ULTRA SYSTEM W/DEVICE KIT Does not apply 1 kit by Does not apply route as directed.      Marland Kitchen CALCITRIOL 0.25 MCG PO CAPS  1 tablet by mouth on Monday,Wednesday and Friday.    .  CYCLOBENZAPRINE HCL 10 MG PO TABS Oral Take 1 tablet (10 mg total) by mouth 2 (two) times daily as needed for muscle spasms. 20 tablet 0  . ENALAPRIL MALEATE 2.5 MG PO TABS  TAKE 1 TABLET TWICE A DAY 60 tablet 3  . ESTRADIOL 10 MCG VA TABS  1 tablet per vagina 2 times per week 30 tablet 12  . SE-TAN PLUS 162-115.2-1 MG PO CAPS Oral Take 1 capsule by mouth daily.     Marland Kitchen FERROUS FUM-IRON POLYSACCH 162-115.2 MG PO CAPS Oral Take 1 capsule by mouth daily with breakfast.      . FLUCONAZOLE 150 MG PO TABS  Take one tablet by mouth once 1 tablet 0  . FLUTICASONE-SALMETEROL 250-50 MCG/DOSE IN AEPB Inhalation Inhale 1 puff into the lungs every 12 (twelve) hours.      . FUROSEMIDE 20 MG PO TABS Oral Take 1 tablet (20 mg total) by mouth daily. 30 tablet 3    2ND REQUEST  . GLIPIZIDE ER 10 MG PO TB24 Oral Take 1 tablet (10 mg total) by mouth 2 (two) times daily. 60 tablet 3    2ND REQUEST  . GLUCOSE BLOOD VI STRP Other 1 each by Other route as directed. Use as instructed     . ISOSORBIDE MONONITRATE ER 60 MG PO TB24 Oral Take 1 tablet (60 mg total) by mouth 2 (two) times daily. 60 tablet 11  . METOPROLOL TARTRATE 50 MG PO TABS Oral Take 1 tablet (50 mg total) by mouth 2 (two) times daily. 60 tablet 3    2ND REQUEST  . MUPIROCIN 2 % EX OINT Topical Apply 1 application topically 3 (three) times daily.      Marland Kitchen NAPROXEN 500 MG PO TABS Oral Take 1 tablet (500 mg total) by mouth 2 (two) times daily with a meal. 30 tablet 0  . PEDI-DRI 100000 UNIT/GM EX POWD Apply externally Apply topically as directed.      Marland Kitchen OMEPRAZOLE 20 MG PO CPDR  TAKE 1 CAPSULE EVERY DAY 30 capsule 3  . SYNTHROID 50 MCG PO TABS  TAKE 1 TABLET EVERY DAY 30 tablet 3    Dispense as written.    BP 158/59  Pulse 75  Temp 98.2 F (36.8 C)  Resp 18  SpO2 99%  Physical Exam  Nursing note and vitals reviewed. Constitutional: She appears well-developed and well-nourished. No distress.  HENT:  Head: Normocephalic and atraumatic.    Mouth/Throat: Oropharynx is clear and moist. No oropharyngeal exudate.  Eyes: Conjunctivae and EOM are normal. Pupils are equal,  round, and reactive to light. Right eye exhibits no discharge. Left eye exhibits no discharge. No scleral icterus.  Neck: Normal range of motion. Neck supple. No JVD present. No thyromegaly present.       Palpation over the right sternocleidomastoid muscle, this is very reproducible , no masses erythema or fluctuance  Cardiovascular: Normal rate, regular rhythm and intact distal pulses.  Exam reveals no gallop and no friction rub.   Murmur heard.      Loud systolic heart murmur  Pulmonary/Chest: Effort normal and breath sounds normal. No respiratory distress. She has no wheezes. She has no rales.  Abdominal: Soft. Bowel sounds are normal. She exhibits no distension and no mass. There is no tenderness.  Musculoskeletal: Normal range of motion. She exhibits no edema and no tenderness.  Lymphadenopathy:    She has no cervical adenopathy.  Neurological: She is alert. Coordination normal.  Skin: Skin is warm and dry. No rash noted. No erythema.  Psychiatric: She has a normal mood and affect. Her behavior is normal.    ED Course  Procedures (including critical care time)  ED ECG REPORT   Date: 05/10/2011   Rate: 76  Rhythm: normal sinus rhythm  QRS Axis: left  Intervals: QRS prolonged  ST/T Wave abnormalities: nonspecific ST/T changes  Conduction Disutrbances:left bundle branch block  Narrative Interpretation:   Old EKG Reviewed: unchanged and Compared with EKG from 09/24/2005   Labs Reviewed - No data to display No results found.   1. Muscle spasm   2. Heart murmur       MDM  Patient is unaware that she has a heart murmur, her pain is very reproducible and appears to be very intermittent over the day, it comes and sharp twinges and I suspect there is some degree of muscle spasm in her sternocleidomastoid muscle. Vital signs show mild hypertension  otherwise unremarkable. Procedures EKG, intramuscular Toradol, cyclobenzaprine, she states that she does have a followup with a cardiologist in the next month as a routine followup  Patient reevaluated and states that she feels much better, EKG is unchanged from last EKG, has been given intramuscular Toradol and Flexeril. We'll discharge home, encouraged to followup with Cardiolopgy re: murmur.      Johnna Acosta, MD 05/10/11 [id number]  Johnna Acosta, MD 05/26/11 [phone]

## 2011-05-10 NOTE — ED Notes (Signed)
[id number] Expected date:05/10/11<BR> Expected time:12:37 AM<BR> Means of arrival:Ambulance<BR> Comments:<BR> Neck pain

## 2011-05-11 ENCOUNTER — Encounter: Payer: Self-pay | Admitting: Obstetrics & Gynecology

## 2011-05-11 ENCOUNTER — Ambulatory Visit (INDEPENDENT_AMBULATORY_CARE_PROVIDER_SITE_OTHER): Payer: MEDICARE | Admitting: Obstetrics & Gynecology

## 2011-05-11 VITALS — BP 122/60 | HR 66 | Ht 60.0 in | Wt 108.0 lb

## 2011-05-11 DIAGNOSIS — Z23 Encounter for immunization: Secondary | ICD-10-CM

## 2011-05-11 DIAGNOSIS — L293 Anogenital pruritus, unspecified: Secondary | ICD-10-CM

## 2011-05-11 DIAGNOSIS — L292 Pruritus vulvae: Secondary | ICD-10-CM

## 2011-05-11 MED ORDER — ESTRADIOL 10 MCG VA TABS: ORAL_TABLET | VAGINAL | Status: DC

## 2011-05-11 NOTE — Progress Notes (Signed)
Patient is here for follow up  

## 2011-05-11 NOTE — Progress Notes (Signed)
  Subjective:    Patient ID: SHELLEEN CAHAN, female    DOB: 1934-10-10, 77 y.o.   MRN: [id number]  HPI  Ms. Merklinger is here for follow up of her vulvar itching. She was using betamethasone on her vulva when she was first seen several months ago. She has continued it. I prescribed vagifem tablets 2 times per week, but she initially used it every night and now is using it very infrequently as she ran out and didn't realize that she had refills at the pharmacy. I am unable to determine if she used the terazol that I prescribed at her last visit. I have asked her bring in her medications at her next visit. She says her vulva feels somewhat improved, but later during our discussion, she says it still hurts.  Review of Systems    She agrees to get her flu shot today. Objective:   Physical Exam   Ivulvar biopsy on right side done after prepping area with betadine and then using 1 cc of 1% lidocaine. She tolerated the procedure well.    Assessment & Plan:  Vulvar skin changes. RTC 1 month to discuss biopsy result. Continue betamethasone and vagifem.

## 2011-05-29 ENCOUNTER — Encounter: Payer: Self-pay | Admitting: Cardiology

## 2011-05-29 ENCOUNTER — Ambulatory Visit (INDEPENDENT_AMBULATORY_CARE_PROVIDER_SITE_OTHER): Payer: MEDICARE | Admitting: Cardiology

## 2011-05-29 DIAGNOSIS — I6529 Occlusion and stenosis of unspecified carotid artery: Secondary | ICD-10-CM

## 2011-05-29 DIAGNOSIS — N189 Chronic kidney disease, unspecified: Secondary | ICD-10-CM

## 2011-05-29 DIAGNOSIS — E7800 Pure hypercholesterolemia, unspecified: Secondary | ICD-10-CM

## 2011-05-29 DIAGNOSIS — R0989 Other specified symptoms and signs involving the circulatory and respiratory systems: Secondary | ICD-10-CM

## 2011-05-29 DIAGNOSIS — I359 Nonrheumatic aortic valve disorder, unspecified: Secondary | ICD-10-CM

## 2011-05-29 DIAGNOSIS — I2510 Atherosclerotic heart disease of native coronary artery without angina pectoris: Secondary | ICD-10-CM

## 2011-05-29 MED ORDER — ISOSORBIDE MONONITRATE ER 60 MG PO TB24: 60.0000 mg | ORAL_TABLET | Freq: Every day | ORAL | Status: DC

## 2011-05-29 NOTE — Patient Instructions (Signed)
Your physician has requested that you have a carotid duplex. This test is an ultrasound of the carotid arteries in your neck. It looks at blood flow through these arteries that supply the brain with blood. Allow one hour for this exam. There are no restrictions or special instructions.  Your physician has requested that you have an echocardiogram. Echocardiography is a painless test that uses sound waves to create images of your heart. It provides your doctor with information about the size and shape of your heart and how well your heart's chambers and valves are working. This procedure takes approximately one hour. There are no restrictions for this procedure.  Your physician recommends that you return for a FASTING lipid profile /liver profile/BMET 424.1  414.01  Your physician wants you to follow-up in: 6 months with Dr Aundra Dubin. (September 2013)  You will receive a reminder letter in the mail two months in advance. If you don't receive a letter, please call our office to schedule the follow-up appointment.

## 2011-05-30 DIAGNOSIS — I6529 Occlusion and stenosis of unspecified carotid artery: Secondary | ICD-10-CM | POA: Insufficient documentation

## 2011-05-30 DIAGNOSIS — N189 Chronic kidney disease, unspecified: Secondary | ICD-10-CM | POA: Insufficient documentation

## 2011-05-30 NOTE — Assessment & Plan Note (Signed)
Stable with no chest pain.  Continue ASA 81, statin, ACEI.

## 2011-05-30 NOTE — Assessment & Plan Note (Signed)
Check lipids, goal LDL < 70.  

## 2011-05-30 NOTE — Progress Notes (Signed)
PCP: Dr. Glori Bickers  77 yo with history of CAD s/p unstable angina/PTCA to D1 in 1997, CKD, and aortic stenosis presents for cardiology followup.  Last myoview in 9/10 showed no ischemia. Last echo in 8/11 showed normal LV systolic function and moderate AS.  I have not seen her for about a year and a half.  No chest pain.  She has started to note the onset of mild exertional dypsnea.  She is short of breath walking to the mailbox (mildly). She attributes this to "old age."  No lightheadedness or syncope.    ECG: NSR, LBBB (old)   Labs (6/11): K 3.4, creatinine 1.6, LDL 76, HDL 47, TSH normal  Labs (1/12): K 3.9, creatinine 1.7 Labs (11/12): TSH normal  Allergies:  1) ! Phenergan  2) ! Glucophage   Past History:  1. Anemia-iron deficiency  2. Asthma  3. Coronary artery disease: Unstable angina in 1997 with PTCA of large D1. Myoview (9/10) at [facility] cardiology showed EF 84%, stable perfusion images with no ischemia.  4. Diabetes mellitus, type II  5. GERD  6. Hypertension  7. Hypothyroidism  8. Pneumonia, hx of  9. CKD: creatinine 1.6 in 6/11  10. Chronic LBBB  11. Aortic stenosis: Echo (4/09) with EF 55-65%, mild aortic stenosis and aortic insufficiency, mild MR.  Echo (8/11): EF 55-60%, mild LVH, moderate aortic stenosis with mean gradient 23 mmHg and peak gradient 85 mmHg.  12. Carotid stenosis: Carotid dopplers 0000000 with [id number] LICA stenosis.   Family History:  No premature CAD.  Social History:  non smoker  widowed, lives with son between [place] and [place] reviewed and negative except as per HPI.   Current Outpatient Prescriptions  Medication Sig Dispense Refill  . albuterol (PROAIR HFA) 108 (90 BASE) MCG/ACT inhaler Inhale 2 puffs into the lungs every 4 (four) hours as needed.  1 Inhaler  5  . amLODipine (NORVASC) 10 MG tablet Take 10 mg by mouth 2 (two) times daily.       Marland Kitchen aspirin 325 MG EC tablet Take 325 mg by mouth daily.         Marland Kitchen atorvastatin (LIPITOR) 40 MG tablet TAKE 1 TABLET EVERY DAY  30 tablet  11  . augmented betamethasone dipropionate (DIPROLENE AF) 0.05 % cream Apply topically 2 (two) times daily. To external vaginal area  30 g  0  . Blood Glucose Monitoring Suppl (ONE TOUCH ULTRA SYSTEM KIT) W/DEVICE KIT 1 kit by Does not apply route as directed.        . calcitRIOL (ROCALTROL) 0.25 MCG capsule 1 tablet by mouth on Monday,Wednesday and Friday.      . enalapril (VASOTEC) 2.5 MG tablet TAKE 1 TABLET TWICE A DAY  60 tablet  3  . Estradiol (VAGIFEM) 10 MCG TABS 1 tablet per vagina 2 times per week  30 tablet  12  . FeFum-FePo-FA-B Cmp-C-Zn-Mn-Cu (SE-TAN PLUS) 162-115.2-1 MG CAPS Take 1 capsule by mouth daily.       . Fluticasone-Salmeterol (ADVAIR DISKUS) 250-50 MCG/DOSE AEPB Inhale 1 puff into the lungs every 12 (twelve) hours.        . furosemide (LASIX) 20 MG tablet Take 1 tablet (20 mg total) by mouth daily.  30 tablet  3  . glipiZIDE (GLUCOTROL XL) 10 MG 24 hr tablet Take 1 tablet (10 mg total) by mouth 2 (two) times daily.  60 tablet  3  . glucose blood test strip 1 each by  Other route as directed. Use as instructed       . isosorbide mononitrate (IMDUR) 60 MG 24 hr tablet Take 1 tablet (60 mg total) by mouth daily.  60 tablet  11  . metoprolol (LOPRESSOR) 50 MG tablet Take 1 tablet (50 mg total) by mouth 2 (two) times daily.  60 tablet  3  . naproxen (NAPROSYN) 500 MG tablet Take 1 tablet (500 mg total) by mouth 2 (two) times daily with a meal.  30 tablet  0  . Nystatin (PEDI-DRI) 100000 UNIT/GM POWD Apply topically as directed.        Marland Kitchen omeprazole (PRILOSEC) 20 MG capsule TAKE 1 CAPSULE EVERY DAY  30 capsule  3  . SYNTHROID 50 MCG tablet TAKE 1 TABLET EVERY DAY  30 tablet  3  . terconazole (TERAZOL 7) 0.4 % vaginal cream       . ferrous fumarate-iron polysaccharide complex (TANDEM) 162-115.2 MG CAPS Take 1 capsule by mouth daily with breakfast.        . fluconazole (DIFLUCAN) 150 MG tablet Take one  tablet by mouth once  1 tablet  0  . ketorolac (ACULAR) 0.4 % SOLN       . mupirocin (BACTROBAN) 2 % ointment Apply 1 application topically 3 (three) times daily.          BP 133/66  Pulse 56  Ht 5' (1.524 m)  Wt 106 lb (48.081 kg)  BMI 20.70 kg/m2 General: NAD Neck: No JVD, no thyromegaly or thyroid nodule.  Lungs: Clear to auscultation bilaterally with normal respiratory effort. CV: Nondisplaced PMI.  Heart regular S1/S2, no S3/S4, 3/6 mid to late peaking systolic murmur RUSB.  S2 is muffled.  Trace ankle edema.  Bilateral carotid bruits versus radiation from the heart murmur.  Normal pedal pulses.  Abdomen: Soft, nontender, no hepatosplenomegaly, no distention.  Neurologic: Alert and oriented x 3.  Psych: Normal affect. Extremities: No clubbing or cyanosis.

## 2011-05-30 NOTE — Assessment & Plan Note (Signed)
Bilateral bruits versus radiation from heart murmur.  Had mild carotid stenosis in the past.   Will repeat carotid dopplers.

## 2011-05-30 NOTE — Assessment & Plan Note (Signed)
Patient has developed mild exertional dyspnea.  Based on her heart exam, I am concerned that the aortic stenosis could have progressed to severe range.  I will get an echocardiogram to assess.

## 2011-05-30 NOTE — Assessment & Plan Note (Signed)
Will get BMET.  Last creatinine I see was 1.7.

## 2011-06-07 ENCOUNTER — Encounter (INDEPENDENT_AMBULATORY_CARE_PROVIDER_SITE_OTHER): Payer: Medicare Other

## 2011-06-07 DIAGNOSIS — R0989 Other specified symptoms and signs involving the circulatory and respiratory systems: Secondary | ICD-10-CM

## 2011-06-07 DIAGNOSIS — I6529 Occlusion and stenosis of unspecified carotid artery: Secondary | ICD-10-CM

## 2011-06-08 ENCOUNTER — Other Ambulatory Visit (INDEPENDENT_AMBULATORY_CARE_PROVIDER_SITE_OTHER): Payer: Medicare Other

## 2011-06-08 ENCOUNTER — Other Ambulatory Visit: Payer: Self-pay

## 2011-06-08 ENCOUNTER — Ambulatory Visit (HOSPITAL_COMMUNITY): Payer: Medicare Other | Attending: Cardiovascular Disease

## 2011-06-08 DIAGNOSIS — R0609 Other forms of dyspnea: Secondary | ICD-10-CM | POA: Insufficient documentation

## 2011-06-08 DIAGNOSIS — I359 Nonrheumatic aortic valve disorder, unspecified: Secondary | ICD-10-CM | POA: Insufficient documentation

## 2011-06-08 DIAGNOSIS — R0989 Other specified symptoms and signs involving the circulatory and respiratory systems: Secondary | ICD-10-CM | POA: Insufficient documentation

## 2011-06-08 DIAGNOSIS — I2510 Atherosclerotic heart disease of native coronary artery without angina pectoris: Secondary | ICD-10-CM

## 2011-06-08 DIAGNOSIS — I447 Left bundle-branch block, unspecified: Secondary | ICD-10-CM | POA: Insufficient documentation

## 2011-06-08 LAB — HEPATIC FUNCTION PANEL
Albumin: 4.3 g/dL (ref 3.5–5.2)
Alkaline Phosphatase: 100 U/L (ref 39–117)
Bilirubin, Direct: 0 mg/dL (ref 0.0–0.3)
Total Bilirubin: 0.5 mg/dL (ref 0.3–1.2)

## 2011-06-08 LAB — LIPID PANEL
LDL Cholesterol: 79 mg/dL (ref 0–99)
Total CHOL/HDL Ratio: 3
Triglycerides: 136 mg/dL (ref 0.0–149.0)

## 2011-06-08 LAB — BASIC METABOLIC PANEL
CO2: 23 mEq/L (ref 19–32)
Chloride: 105 mEq/L (ref 96–112)
Creatinine, Ser: 1.6 mg/dL — ABNORMAL HIGH (ref 0.4–1.2)
Glucose, Bld: 64 mg/dL — ABNORMAL LOW (ref 70–99)

## 2011-06-15 ENCOUNTER — Encounter: Payer: Self-pay | Admitting: Obstetrics & Gynecology

## 2011-06-15 ENCOUNTER — Ambulatory Visit (INDEPENDENT_AMBULATORY_CARE_PROVIDER_SITE_OTHER): Payer: Medicare Other | Admitting: Obstetrics & Gynecology

## 2011-06-15 VITALS — BP 120/55 | HR 56 | Ht 60.0 in | Wt 102.0 lb

## 2011-06-15 DIAGNOSIS — L900 Lichen sclerosus et atrophicus: Secondary | ICD-10-CM

## 2011-06-15 DIAGNOSIS — L940 Localized scleroderma [morphea]: Secondary | ICD-10-CM

## 2011-06-15 MED ORDER — CLOBETASOL PROPIONATE 0.05 % EX CREA: TOPICAL_CREAM | CUTANEOUS | Status: DC

## 2011-06-15 NOTE — Progress Notes (Signed)
  Subjective:    Patient ID: Barbara Garner, female    DOB: 1934/07/25, 77 y.o.   MRN: [id number]  HPI  [name] was diagnosed with Lichen sclerosus via vulvar biopsy. She has been using her betamethasone cream and vagifem tablets "when I feel like I need them", which is usually once per week. She says her vulva feels better.  Review of Systems     Objective:   Physical Exam   No improvement visually of lichen sclerosus     Assessment & Plan:  I have changed her betamethasone to temovate and encouraged her to use it and the vagifem 3 times per week and to come back in about 3 months for a recheck.

## 2011-07-06 ENCOUNTER — Ambulatory Visit (INDEPENDENT_AMBULATORY_CARE_PROVIDER_SITE_OTHER): Payer: Medicare Other | Admitting: Cardiology

## 2011-07-06 ENCOUNTER — Encounter: Payer: Self-pay | Admitting: Cardiology

## 2011-07-06 VITALS — BP 120/58 | HR 48 | Ht 60.0 in | Wt 104.0 lb

## 2011-07-06 DIAGNOSIS — I2510 Atherosclerotic heart disease of native coronary artery without angina pectoris: Secondary | ICD-10-CM

## 2011-07-06 DIAGNOSIS — N189 Chronic kidney disease, unspecified: Secondary | ICD-10-CM

## 2011-07-06 DIAGNOSIS — I6529 Occlusion and stenosis of unspecified carotid artery: Secondary | ICD-10-CM

## 2011-07-06 DIAGNOSIS — I359 Nonrheumatic aortic valve disorder, unspecified: Secondary | ICD-10-CM

## 2011-07-06 MED ORDER — FUROSEMIDE 20 MG PO TABS: ORAL_TABLET | ORAL | Status: DC

## 2011-07-06 NOTE — Assessment & Plan Note (Addendum)
Stable with no chest pain.  Continue ASA 81, statin, ACEI.  Lipids at goal (LDL < 70).

## 2011-07-06 NOTE — Patient Instructions (Signed)
Stop enalapril(vasotec).    Decrease lasix(furosemide) to 20mg  every other day.  Your physician recommends that you have lab work the end of next week-MET/BNP-you have the order. Please fax the results to Dr Aundra Dubin. [phone]  Your physician recommends that you schedule a follow-up appointment with Dr Aundra Dubin the second week of May.

## 2011-07-06 NOTE — Assessment & Plan Note (Signed)
Mrs Budhram has severe AS by exam and by echo.  She is symptomatic with exertional dyspnea, though no lightheadedness/syncope or chest pain.  Her major comorbidity at this time is CKD.  She has no contraindication to surgical intervention though she is somewhat frail.  However, she does all ADLs and is very functional.  I think that surgical AVR would be reasonable.  We talked at length today about the natural history of symptomatic severe aortic stenosis. She will do poorly without valve intervention.  Prior to surgical replacement, she would need cardiac cath, which would open her to the risk of contrast nephropathy.  TAVR would also be an option given her frailty.  She wants to talk this over with her sons, who are not here today.  Therefore, I will see her back in early May.  In the meantime, she plans to discuss her options with her sons and with Dr. Marval Regal.

## 2011-07-06 NOTE — Assessment & Plan Note (Signed)
Moderate bilateral carotid stenosis.  Repeat carotid dopplers in 1 year.

## 2011-07-06 NOTE — Assessment & Plan Note (Signed)
Last creatinine 1.6 with BUN 64. This is around her baseline.  Prior to either surgical AVR or TAVR, she would need cardiac cath.  This will open her to risk for contrast nephropathy.  I am going to have her stop her ACEI for now and also decrease Lasix to every other day.  I will repeat BMET in 7-10 days to see if creatinine improves.  If she decides on valve intervention, I will arrange for RHC/LHC.

## 2011-07-06 NOTE — Progress Notes (Signed)
PCP: Dr. Glori Bickers  77 yo with history of CAD s/p unstable angina/PTCA to D1 in 1997, CKD, and aortic stenosis presents for cardiology followup.  Last myoview in 9/10 showed no ischemia. Lately, she has started to note the onset of mild exertional dypsnea.  She is short of breath walking to the mailbox or after walking about 50 yards in general.   She is short of breath walking up steps or an incline.  This has been slowly progressive over the last year or so. She attributes this to "old age."  No lightheadedness or syncope.  Her son lives with her but she does all ADLs and most of the housework.  She has been having some trouble with diabetic retinopathy and has stopped driving because of this.  She had a murmur sounding like severe AS at last appointment, so I had her get an echocardiogram.  This indeed showed severe aortic stenosis.  She denies chest pain.    ECG: NSR, LBBB (old)   Labs (6/11): K 3.4, creatinine 1.6, LDL 76, HDL 47, TSH normal  Labs (1/12): K 3.9, creatinine 1.7 Labs (11/12): TSH normal Labs (3/13): K 4.4, creatinine 1.6, LDL 79, HDL 52  Allergies:  1) ! Phenergan  2) ! Glucophage   Past History:  1. Anemia-iron deficiency  2. Asthma  3. Coronary artery disease: Unstable angina in 1997 with PTCA of large D1. Myoview (9/10) at [facility] cardiology showed EF 84%, stable perfusion images with no ischemia.  4. Diabetes mellitus, type II  5. GERD  6. Hypertension  7. Hypothyroidism  8. Pneumonia, hx of  9. CKD: creatinine 1.6 in 6/11  10. Chronic LBBB  11. Aortic stenosis: Severe.  Echo (4/09) with EF 55-65%, mild aortic stenosis and aortic insufficiency, mild MR.  Echo (8/11): EF 55-60%, mild LVH, moderate aortic stenosis with mean gradient 23 mmHg and peak gradient 85 mmHg.  Echo (3/13): EF 55-65%, moderately LV hypertrophy, severe AS with mean gradient 40 mmHg/peak gradient 70 mmHg, PA systolic pressure 35 mmHg.  12. Carotid stenosis: Carotid dopplers 0000000 with [id number] LICA  stenosis. Carotid dopplers (3/13) with 40-59% bilateral carotid stenosis.   Family History:  No premature CAD.  Social History:  non smoker  widowed, lives with son between [place] and [place] reviewed and negative except as per HPI.   Current Outpatient Prescriptions  Medication Sig Dispense Refill  . amLODipine (NORVASC) 10 MG tablet Take 10 mg by mouth 2 (two) times daily.       Marland Kitchen aspirin 325 MG EC tablet Take 325 mg by mouth daily.        Marland Kitchen atorvastatin (LIPITOR) 40 MG tablet TAKE 1 TABLET EVERY DAY  30 tablet  11  . augmented betamethasone dipropionate (DIPROLENE AF) 0.05 % cream Apply topically 2 (two) times daily. To external vaginal area  30 g  0  . Blood Glucose Monitoring Suppl (ONE TOUCH ULTRA SYSTEM KIT) W/DEVICE KIT 1 kit by Does not apply route as directed.        . calcitRIOL (ROCALTROL) 0.25 MCG capsule 1 tablet by mouth on Monday,Wednesday and Friday.      . clobetasol cream (TEMOVATE) 0.05 % Apply small amount to vulva 3 times per week  30 g  12  . Estradiol (VAGIFEM) 10 MCG TABS 1 tablet per vagina 2 times per week  30 tablet  12  . FeFum-FePo-FA-B Cmp-C-Zn-Mn-Cu (SE-TAN PLUS) 162-115.2-1 MG CAPS Take 1 capsule by mouth daily.       Marland Kitchen  fluconazole (DIFLUCAN) 150 MG tablet Take one tablet by mouth once  1 tablet  0  . glipiZIDE (GLUCOTROL XL) 10 MG 24 hr tablet Take 1 tablet (10 mg total) by mouth 2 (two) times daily.  60 tablet  3  . glucose blood test strip 1 each by Other route as directed. Use as instructed       . isosorbide mononitrate (IMDUR) 60 MG 24 hr tablet Take 1 tablet (60 mg total) by mouth daily.  60 tablet  11  . metoprolol (LOPRESSOR) 50 MG tablet Take 1 tablet (50 mg total) by mouth 2 (two) times daily.  60 tablet  3  . mupirocin (BACTROBAN) 2 % ointment Apply 1 application topically 3 (three) times daily.        . naproxen (NAPROSYN) 500 MG tablet Take 1 tablet (500 mg total) by mouth 2 (two) times daily with a  meal.  30 tablet  0  . Nystatin (PEDI-DRI) 100000 UNIT/GM POWD Apply topically as directed.        Marland Kitchen omeprazole (PRILOSEC) 20 MG capsule TAKE 1 CAPSULE EVERY DAY  30 capsule  3  . SYNTHROID 50 MCG tablet TAKE 1 TABLET EVERY DAY  30 tablet  3  . terconazole (TERAZOL 7) 0.4 % vaginal cream       . DISCONTD: furosemide (LASIX) 20 MG tablet Take 1 tablet (20 mg total) by mouth daily.  30 tablet  3  . albuterol (PROAIR HFA) 108 (90 BASE) MCG/ACT inhaler Inhale 2 puffs into the lungs every 4 (four) hours as needed.  1 Inhaler  5  . Fluticasone-Salmeterol (ADVAIR DISKUS) 250-50 MCG/DOSE AEPB Inhale 1 puff into the lungs every 12 (twelve) hours.        . furosemide (LASIX) 20 MG tablet Take one every other day  30 tablet  3    BP 120/58  Pulse 48  Ht 5' (1.524 m)  Wt 104 lb (47.174 kg)  BMI 20.31 kg/m2  SpO2 92% General: NAD Neck: No JVD, no thyromegaly or thyroid nodule.  Lungs: Clear to auscultation bilaterally with normal respiratory effort. CV: Nondisplaced PMI.  Heart regular S1/S2, no S3/S4, 3/6 mid to late peaking systolic murmur RUSB.  S2 is muffled.  Trace ankle edema.  Bilateral carotid bruits versus radiation from the heart murmur.  Normal pedal pulses.  Abdomen: Soft, nontender, no hepatosplenomegaly, no distention.  Neurologic: Alert and oriented x 3.  Psych: Normal affect. Extremities: No clubbing or cyanosis.

## 2011-07-11 ENCOUNTER — Telehealth: Payer: Self-pay | Admitting: Cardiology

## 2011-07-11 NOTE — Telephone Encounter (Signed)
Patient son Sereta Gaver request return call regarding the surgery suggested for mother (Patient). He can be reached at [phone].

## 2011-07-11 NOTE — Telephone Encounter (Signed)
Spoke with pt's son,Mickey. He will try to come to pt's appt with Dr Aundra Dubin 07/31/11 to discuss AVR.

## 2011-07-13 ENCOUNTER — Encounter (INDEPENDENT_AMBULATORY_CARE_PROVIDER_SITE_OTHER): Payer: Medicare Other | Admitting: Ophthalmology

## 2011-07-13 DIAGNOSIS — H35039 Hypertensive retinopathy, unspecified eye: Secondary | ICD-10-CM

## 2011-07-13 DIAGNOSIS — E11319 Type 2 diabetes mellitus with unspecified diabetic retinopathy without macular edema: Secondary | ICD-10-CM

## 2011-07-13 DIAGNOSIS — H43819 Vitreous degeneration, unspecified eye: Secondary | ICD-10-CM

## 2011-07-13 DIAGNOSIS — I10 Essential (primary) hypertension: Secondary | ICD-10-CM

## 2011-07-13 DIAGNOSIS — E1139 Type 2 diabetes mellitus with other diabetic ophthalmic complication: Secondary | ICD-10-CM

## 2011-07-14 ENCOUNTER — Encounter: Payer: Self-pay | Admitting: Cardiology

## 2011-07-31 ENCOUNTER — Encounter: Payer: Self-pay | Admitting: Cardiology

## 2011-07-31 ENCOUNTER — Telehealth: Payer: Self-pay | Admitting: Cardiology

## 2011-07-31 ENCOUNTER — Ambulatory Visit (INDEPENDENT_AMBULATORY_CARE_PROVIDER_SITE_OTHER): Payer: Medicare Other | Admitting: Cardiology

## 2011-07-31 ENCOUNTER — Other Ambulatory Visit: Payer: Self-pay | Admitting: *Deleted

## 2011-07-31 VITALS — BP 124/56 | HR 64 | Ht 62.0 in | Wt 103.0 lb

## 2011-07-31 DIAGNOSIS — I6529 Occlusion and stenosis of unspecified carotid artery: Secondary | ICD-10-CM

## 2011-07-31 DIAGNOSIS — I359 Nonrheumatic aortic valve disorder, unspecified: Secondary | ICD-10-CM

## 2011-07-31 DIAGNOSIS — I2510 Atherosclerotic heart disease of native coronary artery without angina pectoris: Secondary | ICD-10-CM

## 2011-07-31 DIAGNOSIS — N189 Chronic kidney disease, unspecified: Secondary | ICD-10-CM

## 2011-07-31 MED ORDER — GLIPIZIDE ER 10 MG PO TB24: 10.0000 mg | ORAL_TABLET | Freq: Two times a day (BID) | ORAL | Status: DC

## 2011-07-31 MED ORDER — METOPROLOL TARTRATE 50 MG PO TABS: 50.0000 mg | ORAL_TABLET | Freq: Two times a day (BID) | ORAL | Status: DC

## 2011-07-31 MED ORDER — FUROSEMIDE 20 MG PO TABS: ORAL_TABLET | ORAL | Status: DC

## 2011-07-31 MED ORDER — OMEPRAZOLE 20 MG PO CPDR: 20.0000 mg | DELAYED_RELEASE_CAPSULE | Freq: Every day | ORAL | Status: DC

## 2011-07-31 MED ORDER — SYNTHROID 50 MCG PO TABS: 50.0000 ug | ORAL_TABLET | Freq: Every day | ORAL | Status: DC

## 2011-07-31 NOTE — Assessment & Plan Note (Signed)
Moderate bilateral carotid stenosis.  Repeat carotid dopplers next year.

## 2011-07-31 NOTE — Progress Notes (Signed)
PCP: Dr. Glori Bickers  77 yo with history of CAD s/p unstable angina/PTCA to D1 in 1997, CKD, and aortic stenosis presents for cardiology followup.  Last myoview in 9/10 showed no ischemia. Lately, she has started to note the onset of mild exertional dypsnea.  She is short of breath walking to the mailbox or after walking about 50 yards in general.   She is short of breath walking up steps or an incline.  This has been slowly progressive over the last year or so. She attributes this to "old age."  No lightheadedness or syncope.  Her son lives with her but she does all ADLs and most of the housework.  She has been having some trouble with diabetic retinopathy and has stopped driving because of this.  She had a murmur sounding like severe AS at a prior appointment, so I had her get an echocardiogram.  This indeed showed severe aortic stenosis.  She denies chest pain.    She returns today with one of her sons to discuss aortic stenosis.  He has noted a decline in her exercise capacity over the last 6 months.  I decreased her Lasix to 20 mg qod and stopped ACEI at last appointment to see if it would improve her renal function.  Creatinine was 1.55 when last checked.  Her BNP was mildly elevated.    ECG: NSR, LBBB (old)   Labs (6/11): K 3.4, creatinine 1.6, LDL 76, HDL 47, TSH normal  Labs (1/12): K 3.9, creatinine 1.7 Labs (11/12): TSH normal Labs (3/13): K 4.4, creatinine 1.6, LDL 79, HDL 52 Labs (4/13): K 4.5, creatinien 1.55, BNP 133  Allergies:  1) ! Phenergan  2) ! Glucophage   Past History:  1. Anemia-iron deficiency  2. Asthma  3. Coronary artery disease: Unstable angina in 1997 with PTCA of large D1. Myoview (9/10) at [facility] cardiology showed EF 84%, stable perfusion images with no ischemia.  4. Diabetes mellitus, type II  5. GERD  6. Hypertension  7. Hypothyroidism  8. Pneumonia, hx of  9. CKD: creatinine 1.6 in 6/11  10. Chronic LBBB  11. Aortic stenosis: Severe.  Echo (4/09) with  EF 55-65%, mild aortic stenosis and aortic insufficiency, mild MR.  Echo (8/11): EF 55-60%, mild LVH, moderate aortic stenosis with mean gradient 23 mmHg and peak gradient 85 mmHg.  Echo (3/13): EF 55-65%, moderately LV hypertrophy, severe AS with mean gradient 40 mmHg/peak gradient 70 mmHg, PA systolic pressure 35 mmHg.  12. Carotid stenosis: Carotid dopplers 0000000 with [id number] LICA stenosis. Carotid dopplers (3/13) with 40-59% bilateral carotid stenosis.   Family History:  No premature CAD.  Social History:  non smoker  widowed, lives with son between [place] and [place] reviewed and negative except as per HPI.   Current Outpatient Prescriptions  Medication Sig Dispense Refill  . albuterol (PROAIR HFA) 108 (90 BASE) MCG/ACT inhaler Inhale 2 puffs into the lungs every 4 (four) hours as needed.  1 Inhaler  5  . amLODipine (NORVASC) 10 MG tablet Take 10 mg by mouth 2 (two) times daily.       Marland Kitchen aspirin 325 MG EC tablet Take 325 mg by mouth daily.        Marland Kitchen atorvastatin (LIPITOR) 40 MG tablet TAKE 1 TABLET EVERY DAY  30 tablet  11  . augmented betamethasone dipropionate (DIPROLENE AF) 0.05 % cream Apply topically 2 (two) times daily. To external vaginal area  30 g  0  .  Blood Glucose Monitoring Suppl (ONE TOUCH ULTRA SYSTEM KIT) W/DEVICE KIT 1 kit by Does not apply route as directed.        . calcitRIOL (ROCALTROL) 0.25 MCG capsule 1 tablet by mouth on Monday,Wednesday and Friday.      . clobetasol cream (TEMOVATE) 0.05 % Apply small amount to vulva 3 times per week  30 g  12  . Estradiol (VAGIFEM) 10 MCG TABS 1 tablet per vagina 2 times per week  30 tablet  12  . FeFum-FePo-FA-B Cmp-C-Zn-Mn-Cu (SE-TAN PLUS) 162-115.2-1 MG CAPS Take 1 capsule by mouth daily.       . ferrous fumarate (HEMOCYTE - 106 MG FE) 325 (106 FE) MG TABS Take 1 tablet by mouth 2 (two) times daily.      . fluconazole (DIFLUCAN) 150 MG tablet Take one tablet by mouth once  1 tablet  0  .  Fluticasone-Salmeterol (ADVAIR DISKUS) 250-50 MCG/DOSE AEPB Inhale 1 puff into the lungs every 12 (twelve) hours.        Marland Kitchen glucose blood test strip 1 each by Other route as directed. Use as instructed       . isosorbide mononitrate (IMDUR) 60 MG 24 hr tablet Take 1 tablet (60 mg total) by mouth daily.  60 tablet  11  . mupirocin (BACTROBAN) 2 % ointment Apply 1 application topically 3 (three) times daily.        . naproxen (NAPROSYN) 500 MG tablet Take 1 tablet (500 mg total) by mouth 2 (two) times daily with a meal.  30 tablet  0  . Nystatin (PEDI-DRI) 100000 UNIT/GM POWD Apply topically as directed.        . terconazole (TERAZOL 7) 0.4 % vaginal cream       . DISCONTD: furosemide (LASIX) 20 MG tablet Take one every other day  30 tablet  3  . DISCONTD: glipiZIDE (GLUCOTROL XL) 10 MG 24 hr tablet Take 1 tablet (10 mg total) by mouth 2 (two) times daily.  60 tablet  3  . DISCONTD: metoprolol (LOPRESSOR) 50 MG tablet Take 1 tablet (50 mg total) by mouth 2 (two) times daily.  60 tablet  3  . DISCONTD: omeprazole (PRILOSEC) 20 MG capsule TAKE 1 CAPSULE EVERY DAY  30 capsule  3  . DISCONTD: SYNTHROID 50 MCG tablet TAKE 1 TABLET EVERY DAY  30 tablet  3  . furosemide (LASIX) 20 MG tablet Take one every other day  90 tablet  3  . glipiZIDE (GLUCOTROL XL) 10 MG 24 hr tablet Take 1 tablet (10 mg total) by mouth 2 (two) times daily.  180 tablet  3  . metoprolol (LOPRESSOR) 50 MG tablet Take 1 tablet (50 mg total) by mouth 2 (two) times daily.  180 tablet  3  . omeprazole (PRILOSEC) 20 MG capsule Take 1 capsule (20 mg total) by mouth daily.  90 capsule  3  . SYNTHROID 50 MCG tablet Take 1 tablet (50 mcg total) by mouth daily.  90 tablet  3    BP 124/56  Pulse 64  Ht 5\' 2"  (1.575 m)  Wt 103 lb (46.72 kg)  BMI 18.84 kg/m2 General: NAD Neck: No JVD, no thyromegaly or thyroid nodule.  Lungs: Clear to auscultation bilaterally with normal respiratory effort. CV: Nondisplaced PMI.  Heart regular S1/S2, no  S3/S4, 3/6 mid to late peaking systolic murmur RUSB.  S2 is muffled.  Trace ankle edema.  Bilateral carotid bruits versus radiation from the heart murmur.  Normal pedal pulses.  Abdomen: Soft, nontender, no hepatosplenomegaly, no distention.  Neurologic: Alert and oriented x 3.  Psych: Normal affect. Extremities: No clubbing or cyanosis.

## 2011-07-31 NOTE — Assessment & Plan Note (Signed)
Last creatinine 1.55. This is around her baseline.  Prior to either surgical AVR or TAVR, she would need cardiac cath.  This will open her to risk for contrast nephropathy.  I have stopped her ACEI and cut back on Lasix.  If she opts to go forward with valve treatment, I will admit her to the hospital for pre-hydration the night before cath, then plan left and right heart cath.

## 2011-07-31 NOTE — Patient Instructions (Signed)
Your physician recommends that you schedule a follow-up appointment in: 3 months with Dr McLean  

## 2011-07-31 NOTE — Assessment & Plan Note (Signed)
Barbara Garner has severe AS by exam and by echo, BNP is mildly elevated.  She is symptomatic with exertional dyspnea, though no lightheadedness/syncope or chest pain.  Her major comorbidity at this time is CKD.  She has no absolute contraindication to surgical intervention though she is somewhat frail.  However, she does all ADLs and is very functional.  I think that surgical AVR would be reasonable.  We again talked at length today about the natural history of symptomatic severe aortic stenosis, this time with one of her sons present. Her 2 year survival without valve intervention will be poor.  Prior to surgical replacement, she would need cardiac cath, which would open her to the risk of contrast nephropathy.  TAVR would also be an option given her frailty.  She has spoken to Dr. Marval Regal about the AS.  She and the son who is here today want to talk things over with her other 2 sons.  She will call us back to let me know which way she wants to go.

## 2011-07-31 NOTE — Telephone Encounter (Signed)
Please return call to patient son [phone]  Patient son cissel has decided to go ahead with patients surgery

## 2011-07-31 NOTE — Assessment & Plan Note (Signed)
Stable with no chest pain.  Continue ASA 81, statin, metoprolol.  Lipids at goal (LDL < 70).

## 2011-08-07 NOTE — Telephone Encounter (Signed)
Fu call Pt's son called back again about setting up his mom's surgery

## 2011-08-07 NOTE — Telephone Encounter (Signed)
I spoke with the patient's son. She is wanting to go ahead and proceed with work up for valve surgery. We will set her up for cath this week.

## 2011-08-07 NOTE — Telephone Encounter (Signed)
Per Dr. Aundra Dubin, the patient will need to come in around 7:00 pm on Thursday evening for hydration to start. The patient is scheduled for a right and left heart cath at 9:30 am on Friday morning. The patient's son, [name] is aware. Will forward this message to Dr. Aundra Dubin as a reminder to do orders for the patient.

## 2011-08-08 ENCOUNTER — Other Ambulatory Visit: Payer: Self-pay | Admitting: Cardiology

## 2011-08-08 DIAGNOSIS — I350 Nonrheumatic aortic (valve) stenosis: Secondary | ICD-10-CM

## 2011-08-10 ENCOUNTER — Telehealth: Payer: Self-pay | Admitting: Cardiology

## 2011-08-10 ENCOUNTER — Other Ambulatory Visit: Payer: Self-pay

## 2011-08-10 ENCOUNTER — Encounter (HOSPITAL_COMMUNITY): Payer: Self-pay | Admitting: Nurse Practitioner

## 2011-08-10 ENCOUNTER — Observation Stay (HOSPITAL_COMMUNITY)
Admission: AD | Admit: 2011-08-10 | Discharge: 2011-08-11 | DRG: 287 | Disposition: A | Discharge status: Home / Self Care | Payer: Medicare Other | Source: Ambulatory Visit | Attending: Cardiology | Admitting: Cardiology

## 2011-08-10 DIAGNOSIS — E7800 Pure hypercholesterolemia, unspecified: Secondary | ICD-10-CM | POA: Diagnosis present

## 2011-08-10 DIAGNOSIS — I6529 Occlusion and stenosis of unspecified carotid artery: Secondary | ICD-10-CM | POA: Diagnosis present

## 2011-08-10 DIAGNOSIS — Z7982 Long term (current) use of aspirin: Secondary | ICD-10-CM

## 2011-08-10 DIAGNOSIS — D509 Iron deficiency anemia, unspecified: Secondary | ICD-10-CM | POA: Diagnosis present

## 2011-08-10 DIAGNOSIS — I10 Essential (primary) hypertension: Secondary | ICD-10-CM | POA: Insufficient documentation

## 2011-08-10 DIAGNOSIS — I454 Nonspecific intraventricular block: Secondary | ICD-10-CM | POA: Insufficient documentation

## 2011-08-10 DIAGNOSIS — I2510 Atherosclerotic heart disease of native coronary artery without angina pectoris: Secondary | ICD-10-CM | POA: Diagnosis present

## 2011-08-10 DIAGNOSIS — E039 Hypothyroidism, unspecified: Secondary | ICD-10-CM | POA: Diagnosis present

## 2011-08-10 DIAGNOSIS — Z79899 Other long term (current) drug therapy: Secondary | ICD-10-CM

## 2011-08-10 DIAGNOSIS — I359 Nonrheumatic aortic valve disorder, unspecified: Principal | ICD-10-CM | POA: Diagnosis present

## 2011-08-10 DIAGNOSIS — E785 Hyperlipidemia, unspecified: Secondary | ICD-10-CM | POA: Insufficient documentation

## 2011-08-10 DIAGNOSIS — Z9861 Coronary angioplasty status: Secondary | ICD-10-CM

## 2011-08-10 DIAGNOSIS — G609 Hereditary and idiopathic neuropathy, unspecified: Secondary | ICD-10-CM | POA: Diagnosis present

## 2011-08-10 DIAGNOSIS — K219 Gastro-esophageal reflux disease without esophagitis: Secondary | ICD-10-CM | POA: Diagnosis present

## 2011-08-10 DIAGNOSIS — I5032 Chronic diastolic (congestive) heart failure: Secondary | ICD-10-CM | POA: Diagnosis present

## 2011-08-10 DIAGNOSIS — E119 Type 2 diabetes mellitus without complications: Secondary | ICD-10-CM | POA: Insufficient documentation

## 2011-08-10 DIAGNOSIS — I350 Nonrheumatic aortic (valve) stenosis: Secondary | ICD-10-CM

## 2011-08-10 DIAGNOSIS — N184 Chronic kidney disease, stage 4 (severe): Secondary | ICD-10-CM | POA: Insufficient documentation

## 2011-08-10 DIAGNOSIS — I129 Hypertensive chronic kidney disease with stage 1 through stage 4 chronic kidney disease, or unspecified chronic kidney disease: Secondary | ICD-10-CM | POA: Diagnosis present

## 2011-08-10 DIAGNOSIS — I509 Heart failure, unspecified: Secondary | ICD-10-CM | POA: Diagnosis present

## 2011-08-10 HISTORY — DX: Chronic diastolic (congestive) heart failure: I50.32

## 2011-08-10 HISTORY — DX: Nonrheumatic aortic (valve) stenosis: I35.0

## 2011-08-10 HISTORY — DX: Atherosclerotic heart disease of native coronary artery without angina pectoris: I25.10

## 2011-08-10 HISTORY — DX: Chronic kidney disease, stage 4 (severe): N18.4

## 2011-08-10 LAB — CBC
HCT: 29.8 % — ABNORMAL LOW (ref 36.0–46.0)
Hemoglobin: 9.8 g/dL — ABNORMAL LOW (ref 12.0–15.0)
MCHC: 32.9 g/dL (ref 30.0–36.0)
RBC: 3.3 MIL/uL — ABNORMAL LOW (ref 3.87–5.11)
WBC: 8.1 10*3/uL (ref 4.0–10.5)

## 2011-08-10 LAB — DIFFERENTIAL
Basophils Absolute: 0.1 10*3/uL (ref 0.0–0.1)
Basophils Relative: 1 % (ref 0–1)
Lymphocytes Relative: 28 % (ref 12–46)
Monocytes Relative: 9 % (ref 3–12)
Neutro Abs: 4.6 10*3/uL (ref 1.7–7.7)
Neutrophils Relative %: 56 % (ref 43–77)

## 2011-08-10 LAB — BASIC METABOLIC PANEL
Calcium: 9.1 mg/dL (ref 8.4–10.5)
Chloride: 106 mEq/L (ref 96–112)
Creatinine, Ser: 1.35 mg/dL — ABNORMAL HIGH (ref 0.50–1.10)
GFR calc Af Amer: 43 mL/min — ABNORMAL LOW (ref >90)
Sodium: 138 mEq/L (ref 135–145)

## 2011-08-10 LAB — PROTIME-INR
INR: 0.93 (ref 0.00–1.49)
Prothrombin Time: 12.7 seconds (ref 11.6–15.2)

## 2011-08-10 MED ORDER — NITROGLYCERIN 0.4 MG SL SUBL: 0.4000 mg | SUBLINGUAL_TABLET | SUBLINGUAL | Status: DC | PRN

## 2011-08-10 MED ORDER — ALBUTEROL SULFATE HFA 108 (90 BASE) MCG/ACT IN AERS: 2.0000 | INHALATION_SPRAY | RESPIRATORY_TRACT | Status: DC | PRN

## 2011-08-10 MED ORDER — ALPRAZOLAM 0.25 MG PO TABS: 0.2500 mg | ORAL_TABLET | Freq: Two times a day (BID) | ORAL | Status: DC | PRN

## 2011-08-10 MED ORDER — SODIUM CHLORIDE 0.9 % IJ SOLN: 3.0000 mL | Freq: Two times a day (BID) | INTRAMUSCULAR | Status: DC

## 2011-08-10 MED ORDER — AMLODIPINE BESYLATE 10 MG PO TABS
10.0000 mg | ORAL_TABLET | Freq: Two times a day (BID) | ORAL | Status: DC
  Administered 2011-08-10 – 2011-08-11 (×2): 10 mg via ORAL
  Filled 2011-08-10 (×3): qty 1

## 2011-08-10 MED ORDER — ATORVASTATIN CALCIUM 40 MG PO TABS
40.0000 mg | ORAL_TABLET | Freq: Every day | ORAL | Status: DC
  Administered 2011-08-11: 40 mg via ORAL
  Filled 2011-08-10: qty 1

## 2011-08-10 MED ORDER — ASPIRIN 81 MG PO CHEW
324.0000 mg | CHEWABLE_TABLET | ORAL | Status: AC
  Administered 2011-08-11: 324 mg via ORAL
  Filled 2011-08-10: qty 4

## 2011-08-10 MED ORDER — ACETAMINOPHEN 325 MG PO TABS: 650.0000 mg | ORAL_TABLET | ORAL | Status: DC | PRN

## 2011-08-10 MED ORDER — SODIUM CHLORIDE 0.9 % IV SOLN: 250.0000 mL | INTRAVENOUS | Status: DC | PRN

## 2011-08-10 MED ORDER — GLIPIZIDE ER 10 MG PO TB24
10.0000 mg | ORAL_TABLET | Freq: Two times a day (BID) | ORAL | Status: DC
  Administered 2011-08-11: 10 mg via ORAL
  Filled 2011-08-10 (×3): qty 1

## 2011-08-10 MED ORDER — SODIUM CHLORIDE 0.9 % IJ SOLN: 3.0000 mL | INTRAMUSCULAR | Status: DC | PRN

## 2011-08-10 MED ORDER — ZOLPIDEM TARTRATE 5 MG PO TABS: 5.0000 mg | ORAL_TABLET | Freq: Every evening | ORAL | Status: DC | PRN

## 2011-08-10 MED ORDER — PANTOPRAZOLE SODIUM 40 MG PO TBEC: 40.0000 mg | DELAYED_RELEASE_TABLET | Freq: Every day | ORAL | Status: DC

## 2011-08-10 MED ORDER — ONDANSETRON HCL 4 MG/2ML IJ SOLN: 4.0000 mg | Freq: Four times a day (QID) | INTRAMUSCULAR | Status: DC | PRN

## 2011-08-10 MED ORDER — SODIUM CHLORIDE 0.9 % IV SOLN
1.0000 mL/kg/h | INTRAVENOUS | Status: AC
  Administered 2011-08-10: 1 mL/kg/h via INTRAVENOUS

## 2011-08-10 MED ORDER — METOPROLOL TARTRATE 50 MG PO TABS
50.0000 mg | ORAL_TABLET | Freq: Two times a day (BID) | ORAL | Status: DC
  Administered 2011-08-10 – 2011-08-11 (×2): 50 mg via ORAL
  Filled 2011-08-10 (×3): qty 1

## 2011-08-10 MED ORDER — ASPIRIN EC 325 MG PO TBEC: 325.0000 mg | DELAYED_RELEASE_TABLET | Freq: Every day | ORAL | Status: DC

## 2011-08-10 MED ORDER — LEVOTHYROXINE SODIUM 50 MCG PO TABS
50.0000 ug | ORAL_TABLET | Freq: Every day | ORAL | Status: DC
  Administered 2011-08-11: 50 ug via ORAL
  Filled 2011-08-10 (×2): qty 1

## 2011-08-10 MED ORDER — ASPIRIN EC 325 MG PO TBEC
325.0000 mg | DELAYED_RELEASE_TABLET | Freq: Every day | ORAL | Status: DC
  Administered 2011-08-10 – 2011-08-11 (×2): 325 mg via ORAL
  Filled 2011-08-10 (×2): qty 1

## 2011-08-10 NOTE — Telephone Encounter (Signed)
Please return call to patient son Byanca Youngblood [phone] regarding cath scheduled for patient Barbara Garner 7 am tomorrow.  He has not heard from anyone with instruction on where to take her and what to do.

## 2011-08-10 NOTE — H&P (Addendum)
Patient ID: Barbara Garner MRN: [id number], DOB/AGE: 75/18/1936   Admit date: 08/10/2011   Primary Physician: Loura Pardon, MD, MD Primary Cardiologist: Einar Crow, MD  Pt. Profile:  77 y/o female with history of progressive dyspnea and severe Ao Stenosis who presents tonight for pre-cath hydration in preparation for cath tomorrow.  Problem List  Past Medical History  Diagnosis Date  . Iron deficiency anemia, unspecified   . Extrinsic asthma, unspecified   . Chronic diastolic CHF (congestive heart failure)   . Coronary atherosclerosis of unspecified type of vessel, native or graft     Unstable angina in 1997 with PTCA of large D1. [facility]9/10[facility] showed EF 84%, stable perfusion images with no ischemia.  . Type II or unspecified type diabetes mellitus without mention of complication, not stated as uncontrolled   . Contact dermatitis and other eczema, due to unspecified cause   . Esophageal reflux   . Headache   . Personal history of unspecified circulatory disease   . Pure hypercholesterolemia   . Unspecified essential hypertension   . Unspecified hypothyroidism   . Other specified erythematous condition   . Memory loss   . Unspecified hereditary and idiopathic peripheral neuropathy   . CKD (chronic kidney disease) stage 4, GFR 15-29 ml/min     Cr 1.6 in 6/11  . LBBB (left bundle branch block)   . Carotid stenosis     Carotid dopplers 0000000 with [id number] LICA stenosis  . Severe aortic stenosis     a. 05/2011 Echo:  EF 55-65%, Gr1DD, Sev AS, Mild AI, PASP 82mmHg.    Past Surgical History  Procedure Date  . Ptca [phone]    5 blockages  . Cardiac catheterization   . Esophageal dilation 1997  . Colonoscopy 04/1999    1 polyp  . Esophagogastroduodenoscopy 04/1999    HH; "watermelon stomach" (severe gastritis)  . Dexa 09/2005    Osteopenia  . Cataract extraction 2003    Bilateral  . Esophagogastroduodenoscopy 04/2002    Gastritis  . Cardiovascular stress test 10/2002     normal (per patient)  . Abdominal hysterectomy 1977    partial; fibroids  . Bladder tack 1977  . Dexa 10/2003    osteoporosis  . Colonoscopy 06/2004    Neg. Int hem  . Carotid dopplers 2006  . Admit 09/2005    n/v; renal failure  . Refractive surgery 2006  . Opthy 11/1998;12/01;11/02    Allergies  Allergies  Allergen Reactions  . Metformin     REACTION: intolerant  . Promethazine Hcl     REACTION: u/k    HPI  77 y/o female with the above problem list.  She was last seen by Dr. Aundra Dubin on 5/13 with complaints of progressive DOE and occasional exertional chest pain relieved with rest.  Her echo from March of this year was reviewed and discussion was held re: candidacy for surgical AVR.  Pt discussed it with her sons and decided to proceed with diagnostic cath.  Given her CKD, her diuretic dose has been cut and ACEI has been d/c'd.  She presents tonight for initiation of hydration in preparation for cath tomorrow.  Since her last visit, she has cont to have dyspnea with minimal exertion and intermittent exertional chest pain with higher levels of activity.  She denies pnd, orthopnea, n, v, dizziness, syncope, edema, or early satiety.  Home Medications  Prior to Admission medications   Medication Sig Start Date End Date Taking? Authorizing Provider  albuterol (PROVENTIL HFA;VENTOLIN HFA) 108 (90 BASE) MCG/ACT inhaler Inhale 2 puffs into the lungs every 4 (four) hours as needed. For shortness of breath. 01/05/11  Yes Abner Greenspan, MD  amLODipine (NORVASC) 10 MG tablet Take 10 mg by mouth 2 (two) times daily.    Yes Historical Provider, MD  aspirin 325 MG EC tablet Take 325 mg by mouth daily.     Yes Historical Provider, MD  atorvastatin (LIPITOR) 40 MG tablet Take 40 mg by mouth daily.   Yes Historical Provider, MD  augmented betamethasone dipropionate (DIPROLENE-AF) 0.05 % cream Apply 1 application topically 2 (two) times daily as needed. To external vaginal area 01/20/11 01/20/12 Yes  Abner Greenspan, MD  calcitRIOL (ROCALTROL) 0.25 MCG capsule 1 tablet by mouth on Monday,Wednesday and Friday.   Yes Historical Provider, MD  clobetasol cream (TEMOVATE) [id number] % Apply 1 application topically 3 (three) times a week. Apply small amount to vulva 06/15/11  Yes Myra C Hulan Fray, MD  FeFum-FePo-FA-B Cmp-C-Zn-Mn-Cu (SE-TAN PLUS) 162-115.2-1 MG CAPS Take 1 capsule by mouth daily.    Yes Historical Provider, MD  furosemide (LASIX) 20 MG tablet Take 20 mg by mouth every other day. 07/31/11  Yes Stafford, MD  glipiZIDE (GLUCOTROL XL) 10 MG 24 hr tablet Take 10 mg by mouth 2 (two) times daily. 07/31/11  Yes Abner Greenspan, MD  isosorbide mononitrate (IMDUR) 60 MG 24 hr tablet Take 60 mg by mouth 2 (two) times daily. 05/29/11  Yes Larey Dresser, MD  levothyroxine (SYNTHROID, LEVOTHROID) 50 MCG tablet Take 50 mcg by mouth daily.   Yes Historical Provider, MD  metoprolol (LOPRESSOR) 50 MG tablet Take 50 mg by mouth 2 (two) times daily. 07/31/11  Yes Abner Greenspan, MD  omeprazole (PRILOSEC) 20 MG capsule Take 20 mg by mouth daily. 07/31/11  Yes Abner Greenspan, MD   Family History  Family History  Problem Relation Age of Onset  . Stroke Father     died in his 74's  . Stroke Mother     died in her 18's   Social History  History   Social History  . Marital Status: Widowed    Spouse Name: N/A    Number of Children: N/A  . Years of Education: N/A   Occupational History  . Not on file.   Social History Main Topics  . Smoking status: Never Smoker   . Smokeless tobacco: Not on file  . Alcohol Use: No  . Drug Use: No  . Sexually Active: No   Other Topics Concern  . Not on file   Social History Narrative   Widowed.  Lives with son in [place].     Review of Systems General:  No chills, fever, night sweats or weight changes.  Cardiovascular: ++ c/p and doe as outlined above.  No edema, orthopnea, palpitations, paroxysmal nocturnal dyspnea. Dermatological: No rash,  lesions/masses Respiratory: No cough, dyspnea Urologic: No hematuria, dysuria Abdominal:   No nausea, vomiting, diarrhea, bright red blood per rectum, melena, or hematemesis Neurologic:  No visual changes, wkns, changes in mental status. All other systems reviewed and are otherwise negative except as noted above.  Physical Exam  Afebrile, 68, 16, 122/64, spO2 98% ra. General: Pleasant, NAD Psych: Normal affect. Neuro: Alert and oriented X 3. Moves all extremities spontaneously. HEENT: Normal  Neck: Supple without or JVD.  Bilat radiated murmur vs. Bruits. Lungs:  Resp regular and unlabored.  Insp crackles @ left base. Heart: RRR no  s3, s4.  3/6 SEM loudest @ bilat USB - heard throughout. S2 absent Abdomen: Soft, non-tender, non-distended, BS + x 4.  Extremities: No clubbing, cyanosis or edema. DP/PT/Radials 2+ and equal bilaterally.  No femoral bruits.  Labs Results for orders placed during the hospital encounter of 08/10/11 (from the past 24 hour(s))  CBC     Status: Abnormal   Collection Time   08/10/11  7:50 PM      Component Value Range   WBC 8.1  4.0 - 10.5 (K/uL)   RBC 3.30 (*) 3.87 - 5.11 (MIL/uL)   Hemoglobin 9.8 (*) 12.0 - 15.0 (g/dL)   HCT 29.8 (*) 36.0 - 46.0 (%)   MCV 90.3  78.0 - 100.0 (fL)   MCH 29.7  26.0 - 34.0 (pg)   MCHC 32.9  30.0 - 36.0 (g/dL)   RDW 14.1  11.5 - 15.5 (%)   Platelets 311  150 - 400 (K/uL)  DIFFERENTIAL     Status: Abnormal   Collection Time   08/10/11  7:50 PM      Component Value Range   Neutrophils Relative 56  43 - 77 (%)   Neutro Abs 4.6  1.7 - 7.7 (K/uL)   Lymphocytes Relative 28  12 - 46 (%)   Lymphs Abs 2.3  0.7 - 4.0 (K/uL)   Monocytes Relative 9  3 - 12 (%)   Monocytes Absolute 0.7  0.1 - 1.0 (K/uL)   Eosinophils Relative 6 (*) 0 - 5 (%)   Eosinophils Absolute 0.5  0.0 - 0.7 (K/uL)   Basophils Relative 1  0 - 1 (%)   Basophils Absolute 0.1  0.0 - 0.1 (K/uL)  APTT     Status: Normal   Collection Time   08/10/11  7:50 PM       Component Value Range   aPTT 33  24 - 37 (seconds)  PROTIME-INR     Status: Normal   Collection Time   08/10/11  7:50 PM      Component Value Range   Prothrombin Time 12.7  11.6 - 15.2 (seconds)   INR 0.93  0.00 - 1.49    Most recent Cr 1.6 (06/08/11) CrCl 33  Radiology/Studies  No results found.  ECG  Pending.  ASSESSMENT AND PLAN  1.  Severe Aortic Stenosis:  She has symptomatic AS with progressive doe and angina.  Pt presents tonight for pre-cath hydration with plan for cath in AM- watch for signs of volume overload.  Hold diuretic.  2.  CAD:  Remote h/o PCI.  Cath tomorrow as outlined above.  3.  CKD III-IV:  Hydration as above.  F/u creat in AM.    4.  HTN:  Stable.  Cont home meds.  5.  HL:  Cont statin.  Signed, Murray Hodgkins, NP 08/10/2011, 7:42 PM  Patient seen and examined independently. Emeline Gins, NP note reviewed carefully - agree with his assessment and plan. I have edited the note based on my findings. Will plan hydration over night with cath in am per Dr. Aundra Dubin. Patient aware of risks of cath.   Benay Spice 8:46 PM

## 2011-08-10 NOTE — Telephone Encounter (Signed)
Pt's son [name] informed of procedure and to wait on a phone call from [facility] with when and where to report.

## 2011-08-10 NOTE — Progress Notes (Signed)
Pt only has one IV access. Pt has very few choices for IV insertions. Three nurses have tried to stick the patient including the IV  Nurse and charge nurse.   Delano Scardino M

## 2011-08-11 ENCOUNTER — Encounter (HOSPITAL_COMMUNITY)
Admission: AD | Disposition: A | Discharge status: Home / Self Care | Payer: Self-pay | Source: Ambulatory Visit | Attending: Cardiology

## 2011-08-11 ENCOUNTER — Ambulatory Visit (HOSPITAL_COMMUNITY): Admission: RE | Admit: 2011-08-11 | Payer: Medicare Other | Source: Ambulatory Visit | Admitting: Cardiology

## 2011-08-11 ENCOUNTER — Encounter (HOSPITAL_COMMUNITY): Payer: Self-pay | Admitting: Nurse Practitioner

## 2011-08-11 ENCOUNTER — Ambulatory Visit (HOSPITAL_COMMUNITY): Admit: 2011-08-11 | Payer: Medicare Other | Admitting: Cardiology

## 2011-08-11 ENCOUNTER — Encounter (HOSPITAL_COMMUNITY): Payer: Self-pay

## 2011-08-11 DIAGNOSIS — I2510 Atherosclerotic heart disease of native coronary artery without angina pectoris: Secondary | ICD-10-CM

## 2011-08-11 HISTORY — PX: LEFT AND RIGHT HEART CATHETERIZATION WITH CORONARY ANGIOGRAM: SHX5449

## 2011-08-11 LAB — IRON AND TIBC
Iron: 27 ug/dL — ABNORMAL LOW (ref 42–135)
Saturation Ratios: 16 % — ABNORMAL LOW (ref 20–55)
UIBC: 142 ug/dL (ref 125–400)

## 2011-08-11 LAB — POCT I-STAT 3, ART BLOOD GAS (G3+)
O2 Saturation: 91 %
TCO2: 22 mmol/L (ref 0–100)
pCO2 arterial: 38.9 mmHg (ref 35.0–45.0)
pH, Arterial: 7.331 — ABNORMAL LOW (ref 7.350–7.400)
pO2, Arterial: 66 mmHg — ABNORMAL LOW (ref 80.0–100.0)

## 2011-08-11 LAB — BASIC METABOLIC PANEL
Calcium: 8.7 mg/dL (ref 8.4–10.5)
GFR calc non Af Amer: 42 mL/min — ABNORMAL LOW (ref >90)
Potassium: 5 mEq/L (ref 3.5–5.1)
Sodium: 140 mEq/L (ref 135–145)

## 2011-08-11 LAB — POCT I-STAT 3, VENOUS BLOOD GAS (G3P V)
Bicarbonate: 21.3 mEq/L (ref 20.0–24.0)
O2 Saturation: 59 %
TCO2: 23 mmol/L (ref 0–100)

## 2011-08-11 LAB — GLUCOSE, CAPILLARY: Glucose-Capillary: 82 mg/dL (ref 70–99)

## 2011-08-11 SURGERY — LEFT AND RIGHT HEART CATHETERIZATION WITH CORONARY ANGIOGRAM
Anesthesia: LOCAL

## 2011-08-11 MED ORDER — FENTANYL CITRATE 0.05 MG/ML IJ SOLN
INTRAMUSCULAR | Status: AC
  Filled 2011-08-11: qty 2

## 2011-08-11 MED ORDER — MIDAZOLAM HCL 2 MG/2ML IJ SOLN
INTRAMUSCULAR | Status: AC
  Filled 2011-08-11: qty 2

## 2011-08-11 MED ORDER — LIDOCAINE HCL (PF) 1 % IJ SOLN
INTRAMUSCULAR | Status: AC
  Filled 2011-08-11: qty 30

## 2011-08-11 MED ORDER — NITROGLYCERIN 0.4 MG SL SUBL: 0.4000 mg | SUBLINGUAL_TABLET | SUBLINGUAL | Status: DC | PRN

## 2011-08-11 MED ORDER — NITROGLYCERIN 0.2 MG/ML ON CALL CATH LAB
INTRAVENOUS | Status: AC
  Filled 2011-08-11: qty 1

## 2011-08-11 MED ORDER — SODIUM CHLORIDE 0.9 % IV SOLN: INTRAVENOUS | Status: AC

## 2011-08-11 MED ORDER — HEPARIN (PORCINE) IN NACL 2-0.9 UNIT/ML-% IJ SOLN
INTRAMUSCULAR | Status: AC
  Filled 2011-08-11: qty 2000

## 2011-08-11 MED ORDER — ACETAMINOPHEN 325 MG PO TABS: 650.0000 mg | ORAL_TABLET | ORAL | Status: DC | PRN

## 2011-08-11 MED ORDER — ONDANSETRON HCL 4 MG/2ML IJ SOLN: 4.0000 mg | Freq: Four times a day (QID) | INTRAMUSCULAR | Status: DC | PRN

## 2011-08-11 NOTE — Progress Notes (Signed)
Pt ambulated in hallway after bedrest was up. Right groin dressing removed. Site level 0. Band-aid applied to site. IV d/c'd. Pt discharged home with 2 sons. Discharge instructions reviewed with pt and sons. All questions answered. Pt instructed to call MD if right groin starts to bleed.   Vella Raring, RN

## 2011-08-11 NOTE — Progress Notes (Addendum)
Patient ID: Barbara Garner, female   DOB: 05-Sep-1934, 77 y.o.   MRN: [id number]    SUBJECTIVE: No complaints.  Despite being NPO, she was sent breakfast today and ate most of it.      Marland Kitchen amLODipine  10 mg Oral BID  . aspirin  324 mg Oral Pre-Cath  . aspirin EC  325 mg Oral Daily  . atorvastatin  40 mg Oral q1800  . glipiZIDE  10 mg Oral BID WC  . levothyroxine  50 mcg Oral QAC breakfast  . metoprolol  50 mg Oral BID  . pantoprazole  40 mg Oral Q1200  . sodium chloride  3 mL Intravenous Q12H  . sodium chloride  3 mL Intravenous Q12H  . DISCONTD: aspirin  325 mg Oral Daily      Filed Vitals:   08/10/11 1949 08/10/11 2026 08/11/11 0343 08/11/11 0353  BP:  161/68  132/67  Pulse:  61  57  Temp:  98.2 F (36.8 C)  98.2 F (36.8 C)  TempSrc:  Oral  Oral  Resp:  16  18  Height:  5\' 2"  (1.575 m)    Weight:  102 lb 3.2 oz (46.358 kg) 104 lb 12.8 oz (47.537 kg)   SpO2: 96% 96%  93%    Intake/Output Summary (Last 24 hours) at 08/11/11 0804 Last data filed at 08/11/11 0342  Gross per 24 hour  Intake      0 ml  Output    700 ml  Net   -700 ml    LABS: Basic Metabolic Panel:  Basename 08/10/11 1950  NA 138  K 4.7  CL 106  CO2 22  GLUCOSE 122*  BUN 27*  CREATININE 1.35*  CALCIUM 9.1  MG --  PHOS --   Liver Function Tests: No results found for this basename: AST:2,ALT:2,ALKPHOS:2,BILITOT:2,PROT:2,ALBUMIN:2 in the last 72 hours No results found for this basename: LIPASE:2,AMYLASE:2 in the last 72 hours CBC:  Basename 08/10/11 1950  WBC 8.1  NEUTROABS 4.6  HGB 9.8*  HCT 29.8*  MCV 90.3  PLT 311   RADIOLOGY: No results found.  PHYSICAL EXAM General: NAD Neck: No JVD, no thyromegaly or thyroid nodule.  Lungs: Clear to auscultation bilaterally with normal respiratory effort. CV: Nondisplaced PMI.  Heart regular S1/S2, no [id number], 3/6 systolic m RUSB with obscuration of S2.  No peripheral edema.  No carotid bruit.  Normal pedal pulses.  Abdomen: Soft, nontender, no  hepatosplenomegaly, no distention.  Neurologic: Alert and oriented x 3.  Psych: Normal affect. Extremities: No clubbing or cyanosis.   TELEMETRY: Reviewed telemetry pt in NSR  ASSESSMENT AND PLAN:  77 yo with history of severe aortic stenosis and CKD presents for cath.  1. AS: Severe AS by last echo.  She has DOE.  Plan for [facility] today. She ate breakfast so will do this afternoon.  She is getting prehydration because of CKD.  2. CKD: Creatinine 1.3 last night is improved.  Pending creatinine this morning.   3. Anemia: ? Cause.  No overt bleeding.  CKD likely contributes.  Will check Fe, TIBC,ferritin, B12, folate. Guaiac stool.   Loralie Champagne 08/11/2011 8:07 AM

## 2011-08-11 NOTE — CV Procedure (Signed)
   Cardiac Catheterization Procedure Note  Name: Barbara Garner MRN: [id number] DOB: 03-Apr-1934  Procedure: Right Heart Cath, Selective Coronary Angiography  Indication:    Procedural Details: The right groin was prepped, draped, and anesthetized with 1% lidocaine. Using the modified Seldinger technique a 5 French sheath was placed in the right femoral artery and a 7 French sheath was placed in the right femoral vein. A Swan-Ganz catheter was used for the right heart catheterization. Standard protocol was followed for recording of right heart pressures and sampling of oxygen saturations. Fick cardiac output was calculated. Standard Judkins catheters were used for selective coronary angiography. There were no immediate procedural complications. The patient was transferred to the post catheterization recovery area for further monitoring.  Aortic valve not crossed, known severe aortic stenosis from high-quality echo.   Procedural Findings: Hemodynamics (mmHg) RA mean 6 RV 29/8 PA 30/11 PCWP mean 16 AO 147/58  Oxygen saturations: PA 59% AO 91%  Cardiac Output (Fick) 4.52 L/min  Cardiac Index (Fick) 3.12   Coronary angiography: Coronary dominance: right  Left mainstem: 40% distal LM stenosis.    Left anterior descending (LAD): Large D1 with 80% ostial stenosis.  Diffuse moderate disease in the mid LAD reaching 75% stenosis.   Left circumflex (LCx): The OM1 was moderate with 95% proximal stenosis.  After OM1 takeoff, the AV LCx was relatively small with 90% mid vessel stenosis.    Right coronary artery (RCA): 30% proximal RCA stenosis, 30% mid RCA stenosis, 75% distal RCA stenosis just before bifurcation.  The PDA is small and severely disease, up to 80% stenosis, in the proximal segment.  The PLV is larger with about 30-40% mid vessel stenosis.   Left ventriculography: Left ventricular systolic function is normal, LVEF is estimated at 55-65%, there is no significant mitral  regurgitation   Final Conclusions:  Moderate to severe 3 vessel disease as above.  LV and RV filling pressures are not significantly elevated.  She has known severe aortic stenosis.   Recommendations: CVTS evaluation.  She is frail-appearing but does all her ADLs, housework, cooking, etc.  She has diffuse moderate to severe 3 vessel disease.  She has significant CKD so we minimized contrast today (no LV-gram).  She is small in stature so may not be ideal TAVR candidate, also has diffuse CAD which would be more easily revascularized surgically.  She would be a good candidate for valve clinic.    Loralie Champagne 08/11/2011, 2:20 PM

## 2011-08-11 NOTE — Progress Notes (Signed)
On waking for transport to cath lab Pt complained of pain at IV site.  NS running at 47.  Minimal swelling noted, no redness.  Cath lab notified, plan for new site after transport.

## 2011-08-11 NOTE — Discharge Summary (Signed)
Patient ID: Barbara Garner,  MRN: [id number], DOB/AGE: June 29, 1934 78 y.o.  Admit date: 08/10/2011 Discharge date: 08/11/2011  Primary Care Provider: Loura Pardon, MD Primary Cardiologist: Einar Crow, MD  Discharge Diagnoses Principal Problem:  *Severe aortic stenosis Active Problems:  CORONARY ARTERY DISEASE  DIABETES MELLITUS, TYPE II  HYPERCHOLESTEROLEMIA, PURE  HYPERTENSION  GERD  CKD (chronic kidney disease) stage 4, GFR 15-29 ml/min  HYPOTHYROIDISM  PERIPHERAL NEUROPATHY  Allergies Allergies  Allergen Reactions  . Metformin     REACTION: intolerant  . Promethazine Hcl     REACTION: u/k   Procedures  Cardiac Catheterization 08/11/2011  Procedural Findings: Hemodynamics (mmHg) RA mean 6 RV 29/8 PA 30/11 PCWP mean 16 AO 147/58  Oxygen saturations: PA 59% AO 91%  Cardiac Output (Fick) 4.52 L/min  Cardiac Index (Fick) 3.12          Coronary angiography: Coronary dominance: right  Left mainstem: 40% distal LM stenosis.   Left anterior descending (LAD): Large D1 with 80% ostial stenosis.  Diffuse moderate disease in the mid LAD reaching 75% stenosis.   Left circumflex (LCx): The OM1 was moderate with 95% proximal stenosis.  After OM1 takeoff, the AV LCx was relatively small with 90% mid vessel stenosis.    Right coronary artery (RCA): 30% proximal RCA stenosis, 30% mid RCA stenosis, 75% distal RCA stenosis just before bifurcation.  The PDA is small and severely disease, up to 80% stenosis, in the proximal segment.  The PLV is larger with about 30-40% mid vessel stenosis.    Left ventriculography: Left ventricular systolic function is normal, LVEF is estimated at 55-65%, there is no significant mitral regurgitation  _____________  History of Present Illness  77 y/o female with the above complex problem list.  She has known severe Aortic Stenosis and has been experiencing progressive dyspnea and exertional chest pain.  She was recently seen by Dr. Aundra Dubin and decision  was made to pursue diagnostic catheterization.  Hospital Course  Pt presented the evening before scheduled cath for hydration 2/2 to history of CKD.  Creatinine was stable this AM and she underwent diagnostic catheterization revealing severe diffuse 3 vessel CAD.  Arrangements have been made for her to f/u with cardiothoracic surgery as an outpt on 6/3 and she will be discharged home this evening in good condition.  We have arranged for a bmet in our office on 5/28.  Discharge Vitals Blood pressure 153/61, pulse 55, temperature 98.2 F (36.8 C), temperature source Oral, resp. rate 18, height 5\' 2"  (1.575 m), weight 104 lb 12.8 oz (47.537 kg), SpO2 93.00%.  Filed Weights   08/10/11 2026 08/11/11 0343  Weight: 102 lb 3.2 oz (46.358 kg) 104 lb 12.8 oz (47.537 kg)   Labs  CBC  Basename 08/10/11 1950  WBC 8.1  NEUTROABS 4.6  HGB 9.8*  HCT 29.8*  MCV 90.3  PLT [id number]   Basic Metabolic Panel  Basename [id number] 08/10/11 1950  NA 140 138  K 5.0 4.7  CL 109 106  CO2 20 22  GLUCOSE 65* 122*  BUN 24* 27*  CREATININE 1.22* 1.35*  CALCIUM 8.7 9.1  MG -- --  PHOS -- --   Disposition  Pt is being discharged home today in good condition.  Follow-up Plans & Appointments  Follow-up Information    Follow up with Loralie Champagne, MD on 08/30/2011. (11:15 AM)    Contact information:   [id number] [place] [phone]  Follow up with Rexene Alberts, MD on 08/21/2011. (9:30 AM)    Contact information:   [address] [place] [phone]          Discharge Medications  Medication List  As of 08/11/2011  4:03 PM   STOP taking these medications         enalapril 5 MG tablet      furosemide 20 MG tablet         TAKE these medications         albuterol 108 (90 BASE) MCG/ACT inhaler   Commonly known as: PROVENTIL HFA;VENTOLIN HFA   Inhale 2 puffs into the  lungs every 4 (four) hours as needed. For shortness of breath.      amLODipine 10 MG tablet   Commonly known as: NORVASC   Take 10 mg by mouth 2 (two) times daily.      aspirin 325 MG EC tablet   Take 325 mg by mouth daily.      atorvastatin 40 MG tablet   Commonly known as: LIPITOR   Take 40 mg by mouth daily.      augmented betamethasone dipropionate 0.05 % cream   Commonly known as: DIPROLENE-AF   Apply 1 application topically 2 (two) times daily as needed. To external vaginal area      calcitRIOL 0.25 MCG capsule   Commonly known as: ROCALTROL   1 tablet by mouth on Monday,Wednesday and Friday.      clobetasol cream 0.05 %   Commonly known as: TEMOVATE   Apply 1 application topically 3 (three) times a week. Apply small amount to vulva      glipiZIDE 10 MG 24 hr tablet   Commonly known as: GLUCOTROL XL   Take 10 mg by mouth 2 (two) times daily.      isosorbide mononitrate 60 MG 24 hr tablet   Commonly known as: IMDUR   Take 60 mg by mouth 2 (two) times daily.      levothyroxine 50 MCG tablet   Commonly known as: SYNTHROID, LEVOTHROID   Take 50 mcg by mouth daily.      metoprolol 50 MG tablet   Commonly known as: LOPRESSOR   Take 50 mg by mouth 2 (two) times daily.      nitroGLYCERIN 0.4 MG SL tablet   Commonly known as: NITROSTAT   Place 1 tablet (0.4 mg total) under the tongue every 5 (five) minutes x 3 doses as needed for chest pain.      omeprazole 20 MG capsule   Commonly known as: PRILOSEC   Take 20 mg by mouth daily.      SE-TAN PLUS 162-115.2-1 MG Caps   Take 1 capsule by mouth daily.           Outstanding Labs/Studies  BMET 5/28  Duration of Discharge Encounter   Greater than 30 minutes including physician time.  SignedMurray Hodgkins NP 08/11/2011, 4:03 PM

## 2011-08-11 NOTE — Discharge Instructions (Signed)
***  PLEASE REMEMBER TO BRING ALL OF YOUR MEDICATIONS TO EACH OF YOUR FOLLOW-UP OFFICE VISITS.  NO HEAVY LIFTING OR SEXUAL ACTIVITY X 7 DAYS. NO DRIVING X 2-3 DAYS. NO SOAKING BATHS, HOT TUBS, POOLS, ETC., X 7 DAYS.  

## 2011-08-15 ENCOUNTER — Ambulatory Visit (INDEPENDENT_AMBULATORY_CARE_PROVIDER_SITE_OTHER): Payer: Medicare Other | Admitting: *Deleted

## 2011-08-15 DIAGNOSIS — I10 Essential (primary) hypertension: Secondary | ICD-10-CM

## 2011-08-15 DIAGNOSIS — N170 Acute kidney failure with tubular necrosis: Secondary | ICD-10-CM

## 2011-08-15 DIAGNOSIS — I252 Old myocardial infarction: Secondary | ICD-10-CM

## 2011-08-15 LAB — BASIC METABOLIC PANEL
BUN: 33 mg/dL — ABNORMAL HIGH (ref 6–23)
CO2: 22 mEq/L (ref 19–32)
Calcium: 8.6 mg/dL (ref 8.4–10.5)
Chloride: 111 mEq/L (ref 96–112)
Creatinine, Ser: 1.5 mg/dL — ABNORMAL HIGH (ref 0.4–1.2)

## 2011-08-16 ENCOUNTER — Other Ambulatory Visit: Payer: Self-pay | Admitting: *Deleted

## 2011-08-16 DIAGNOSIS — E875 Hyperkalemia: Secondary | ICD-10-CM

## 2011-08-19 DIAGNOSIS — I2510 Atherosclerotic heart disease of native coronary artery without angina pectoris: Secondary | ICD-10-CM

## 2011-08-19 HISTORY — DX: Atherosclerotic heart disease of native coronary artery without angina pectoris: I25.10

## 2011-08-21 ENCOUNTER — Encounter: Payer: Self-pay | Admitting: Thoracic Surgery (Cardiothoracic Vascular Surgery)

## 2011-08-21 ENCOUNTER — Other Ambulatory Visit: Payer: Self-pay | Admitting: Thoracic Surgery (Cardiothoracic Vascular Surgery)

## 2011-08-21 ENCOUNTER — Institutional Professional Consult (permissible substitution) (INDEPENDENT_AMBULATORY_CARE_PROVIDER_SITE_OTHER): Payer: Medicare Other | Admitting: Thoracic Surgery (Cardiothoracic Vascular Surgery)

## 2011-08-21 VITALS — BP 119/56 | HR 53 | Resp 18 | Ht 61.0 in | Wt 103.0 lb

## 2011-08-21 DIAGNOSIS — I359 Nonrheumatic aortic valve disorder, unspecified: Secondary | ICD-10-CM

## 2011-08-21 DIAGNOSIS — I350 Nonrheumatic aortic (valve) stenosis: Secondary | ICD-10-CM

## 2011-08-21 DIAGNOSIS — D381 Neoplasm of uncertain behavior of trachea, bronchus and lung: Secondary | ICD-10-CM

## 2011-08-21 DIAGNOSIS — I2510 Atherosclerotic heart disease of native coronary artery without angina pectoris: Secondary | ICD-10-CM

## 2011-08-21 NOTE — Progress Notes (Signed)
[address]            [place] [address]          [phone]     CARDIOTHORACIC SURGERY CONSULTATION REPORT  Referring Provider is Larey Dresser, MD PCP is Loura Pardon, MD, MD  Chief Complaint  Patient presents with  . Coronary Artery Disease    Referral from Dr Aundra Dubin for eval on severe CAD, Cardiac Cath on 08/11/11    HPI:  Patient is a 77 year old widowed white female from [place], [place] with known history of aortic stenosis and coronary artery disease.  The patient underwent angioplasty in 1997 for unstable angina involving high-grade stenosis of a large diagonal branch. She has remained clinically stable from a cardiovascular standpoint since then. She developed progressive aortic stenosis which is now clearly severe and symptomatic. A followup echocardiogram performed in March confirmed the presence of severe aortic stenosis with peak velocity greater than 4 m/s and peak and mean transvalvular gradient estimated 70 and 40 mm mercury respectively. Left ventricular systolic function is preserved.  Over the past few years the patient has developed progressive exertional shortness of breath and fatigue. Recent months the patient reports that she gets short of breath with very mild physical activity. She gets severely short of breath with moderate activity and this has been associated with mild tightness across her chest. She denies any pain or shortness of breath occurring at rest. She has not had any nocturnal chest pain or shortness of breath. She denies PND, orthopnea, or lower extremity edema. She has not had any dizzy spells or syncope. Cardiac catheterization demonstrates severe three-vessel coronary artery disease. The patient has now been referred to consider possible aortic valve replacement and coronary artery bypass grafting.  Past Medical History  Diagnosis Date  . Iron deficiency anemia, unspecified   . Extrinsic asthma, unspecified     . Chronic diastolic CHF (congestive heart failure)   . CAD (coronary artery disease)     a.  Unstable angina in 1997 with PTCA of large D1;  b. Myoview (9/10) at [facility] showed EF 84%, stable perfusion images with no ischemia.;  c. Cath 07/2011 - Severe Diffuse 3VD  . Type II or unspecified type diabetes mellitus without mention of complication, not stated as uncontrolled   . Contact dermatitis and other eczema, due to unspecified cause   . Esophageal reflux   . Headache   . Personal history of unspecified circulatory disease   . Pure hypercholesterolemia   . Unspecified essential hypertension   . Unspecified hypothyroidism   . Other specified erythematous condition   . Memory loss   . Unspecified hereditary and idiopathic peripheral neuropathy   . CKD (chronic kidney disease) stage 4, GFR 15-29 ml/min     Cr 1.6 in 6/11  . LBBB (left bundle branch block)   . Carotid stenosis     Carotid dopplers [id number] with [id number] LICA stenosis  . Severe aortic stenosis     a. 05/2011 Echo:  EF 55-65%, Gr1DD, Sev AS, Mild AI, PASP 52mmHg.    Past Surgical History  Procedure Date  . Ptca [id number]    5 blockages  . Cardiac catheterization   . Esophageal dilation 1997  . Colonoscopy 04/1999    1 polyp  . Esophagogastroduodenoscopy 04/1999    HH; "watermelon stomach" (severe gastritis)  . Dexa 09/2005    Osteopenia  .  Cataract extraction 2003    Bilateral  . Esophagogastroduodenoscopy 04/2002    Gastritis  . Cardiovascular stress test 10/2002    normal (per patient)  . Abdominal hysterectomy 1977    partial; fibroids  . Bladder tack 1977  . Dexa 10/2003    osteoporosis  . Colonoscopy 06/2004    Neg. Int hem  . Carotid dopplers 2006  . Admit 09/2005    n/v; renal failure  . Refractive surgery 2006  . Opthy 11/1998;12/01;11/02    Family History  Problem Relation Age of Onset  . Stroke Father     died in his 54's  . Stroke Mother     died in her 17's    Social History History   Substance Use Topics  . Smoking status: Never Smoker   . Smokeless tobacco: Not on file  . Alcohol Use: No    Current Outpatient Prescriptions  Medication Sig Dispense Refill  . albuterol (PROVENTIL HFA;VENTOLIN HFA) 108 (90 BASE) MCG/ACT inhaler Inhale 2 puffs into the lungs every 4 (four) hours as needed. For shortness of breath.      Marland Kitchen amLODipine (NORVASC) 10 MG tablet Take 10 mg by mouth daily.       Marland Kitchen aspirin 325 MG EC tablet Take 325 mg by mouth daily.        Marland Kitchen atorvastatin (LIPITOR) 40 MG tablet Take 40 mg by mouth daily.      Marland Kitchen FeFum-FePo-FA-B Cmp-C-Zn-Mn-Cu (SE-TAN PLUS) 162-115.2-1 MG CAPS Take 1 capsule by mouth daily.       . furosemide (LASIX) 20 MG tablet Take 20 mg by mouth daily.       Marland Kitchen glipiZIDE (GLUCOTROL XL) 10 MG 24 hr tablet Take 10 mg by mouth 2 (two) times daily.      . isosorbide mononitrate (IMDUR) 60 MG 24 hr tablet Take 60 mg by mouth 2 (two) times daily.      Marland Kitchen levothyroxine (SYNTHROID, LEVOTHROID) 50 MCG tablet Take 50 mcg by mouth daily.      . metoprolol (LOPRESSOR) 50 MG tablet Take 50 mg by mouth 2 (two) times daily.      . nitroGLYCERIN (NITROSTAT) 0.4 MG SL tablet Place 1 tablet (0.4 mg total) under the tongue every 5 (five) minutes x 3 doses as needed for chest pain.  25 tablet  3  . omeprazole (PRILOSEC) 20 MG capsule Take 20 mg by mouth daily.        Allergies  Allergen Reactions  . Metformin     REACTION: intolerant  . Promethazine Hcl     REACTION: u/k    Review of Systems:  General:  normal appetite, decreased energy, no weight loss  Respiratory:  + dry non-productive cough worse with activity, no wheezing, no hemoptysis, no pain with inspiration or cough, + exertional shortness of breath  Cardiac:  + exertional chest pain or tightness, + exertional SOB - functional class III, no resting SOB, no PND, no orthopnea, no LE edema, no palpitations, no syncope  GI:   no difficulty swallowing, no hematochezia, no hematemesis, no melena, no  constipation, no diarrhea   GU:   no dysuria, no urgency, no frequency   Musculoskeletal: no arthritis, no arthralgia   Vascular:  no pain suggestive of claudication   Neuro:   no symptoms suggestive of TIA's, no seizures, no headaches, no peripheral neuropathy   Endocrine:  Negative   HEENT:  Edentulous with full dentures,  + recent vision changes with gradual deterioration both eyes -  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  Psych:   no anxiety, no depression    Physical Exam:   BP 119/56  Pulse 53  Resp 18  Ht 5\' 1"  (1.549 m)  Wt 103 lb (46.72 kg)  BMI 19.46 kg/m2  SpO2 93%  General:  Thin, somewhat frail-appearing  HEENT:  Unremarkable   Neck:   no JVD, no bruits, no adenopathy   Chest:   clear to auscultation, symmetrical breath sounds, no wheezes, no rhonchi   CV:   RRR, grade IV/VI crescendo/descrescendo systolic murmur   Abdomen:  soft, non-tender, no masses   Extremities:  warm, well-perfused, pulses diminished - palpable in groins but not at ankle, no LE edema  Rectal/GU  Deferred  Neuro:   Grossly non-focal and symmetrical throughout  Skin:   Clean and dry, no rashes, no breakdown   Diagnostic Tests:  Transthoracic Echocardiography  Patient:    Barbara Garner, Barbara Garner MR #:       [id number] Study Date: 06/08/2011 Gender:     F Age:        40 Height:     152.4cm Weight:     48.1kg BSA:        1.67m^2 ------------------------------------------------------------ LV EF: 55% -   65% ------------------------------------------------------------ Indications:      Aortic stenosis /insufficiency 424.1. ------------------------------------------------------------ Study Conclusions  - Left ventricle: The cavity size was normal. Wall thickness   was increased in a pattern of moderate LVH. Systolic   function was normal. The estimated ejection fraction was   in the range of 55% to 65%. Wall motion was normal; there   were no regional wall motion abnormalities. Doppler   parameters are  consistent with abnormal left ventricular   relaxation (grade 1 diastolic dysfunction). - Aortic valve: There was severe stenosis. Mild   regurgitation. - Mitral valve: Calcified annulus. The findings are   consistent with mild stenosis. Mild regurgitation. - Left atrium: The atrium was mildly dilated. - Atrial septum: No defect or patent foramen ovale was   identified. - Pulmonary arteries: PA peak pressure: 61mm Hg (S). ------------------------------------------------------------ -------------------------------------------------------- Left ventricle:  The cavity size was normal. Wall thickness was increased in a pattern of moderate LVH. Systolic function was normal. The estimated ejection fraction was in the range of 55% to 65%. Wall motion was normal; there were no regional wall motion abnormalities. Doppler parameters are consistent with abnormal left ventricular relaxation (grade 1 diastolic dysfunction). ------------------------------------------------------------ Aortic valve:   Trileaflet.  Doppler:   There was severe stenosis.    Mild regurgitation.    VTI ratio of LVOT to aortic valve: 0.22. Indexed valve area: 0.39cm^2/m^2 (VTI). Peak velocity ratio of LVOT to aortic valve: 0.2. Indexed valve area: 0.36cm^2/m^2 (Vmax).    Mean gradient: 29mm Hg (S). Peak gradient: 66mm Hg (S). ------------------------------------------------------------ Aorta:  The aorta was normal, not dilated, and non-diseased. ------------------------------------------------------------ Mitral valve:   Calcified annulus.  Doppler:   The findings are consistent with mild stenosis.    Mild regurgitation. Indexed valve area by pressure half-time: 1.21cm^2/m^2. Indexed valve area by continuity equation (using LVOT flow): 0.97cm^2/m^2.    Mean gradient: 38mm Hg (D). Peak gradient: 56mm Hg (D). ------------------------------------------------------------ Left atrium:  The atrium was mildly  dilated. ------------------------------------------------------------ Atrial septum:  No defect or patent foramen ovale was identified. ------------------------------------------------------------ Right ventricle:  The cavity size was normal. Wall thickness was normal. Systolic function was normal. ------------------------------------------------------------ Pulmonic valve:    Structurally normal valve.   Cusp separation was normal.  Doppler:  Transvalvular velocity was within the normal range.  No significant regurgitation. ------------------------------------------------------------ Tricuspid valve:   Structurally normal valve.   Leaflet separation was normal.  Doppler:  Transvalvular velocity was within the normal range.  Mild regurgitation. ------------------------------------------------------------ Pulmonary artery:   The main pulmonary artery was normal-sized. ------------------------------------------------------------ Right atrium:  The atrium was normal in size. ------------------------------------------------------------ Pericardium:  The pericardium was normal in appearance. ------------------------------------------------------------ Systemic veins: Inferior vena cava: The vessel was normal in size; the respirophasic diameter changes were in the normal range (= 50%); findings are consistent with normal central venous pressure. ------------------------------------------------------------ Post procedure conclusions Ascending Aorta:  - The aorta was normal, not dilated, and non-diseased. ------------------------------------------------------------ 2D measurements        Normal  Doppler measurements    Norma Left ventricle                                         l LVID ED,   29.9 mm     43-52   Main pulmonary artery chord,                         Pressure,   35 mm Hg    =30 PLAX                           S LVID ES,   21.2 mm     23-38   Left ventricle chord,                          Ea, lat    6.1 cm/s     ----- PLAX                           ann, tiss    4 FS, chord,   29 %      >29     DP PLAX                           E/Ea, lat  15.          ----- LVPW, ED   17.8 mm     ------  ann, tiss   81 IVS/LVPW   0.93        <1.3    DP ratio, ED                      Ea, med    2.6 cm/s     ----- Ventricular septum             ann, tiss    9 IVS, ED    16.6 mm     ------  DP LVOT                           E/Ea, med  36.          ----- Diam, S      18 mm     ------  ann, tiss    1 Area       2.54 cm^2   ------  DP Diam         18 mm     ------  LVOT Aorta  Peak vel,  83. cm/s     ----- Root diam,   29 mm     ------  S            5 ED                             VTI, S     24. cm       ----- Left atrium                                 8 AP dim       41 mm     ------  HR          58 bpm      ----- AP dim     2.87 cm/m^2 <2.2    Stroke vol 63. ml       ----- index                                       1                                Cardiac    3.7 L/min    -----                                output                                Cardiac    2.6 L/(min-m -----                                index          ^2)                                Stroke     44. ml/m^2   -----                                index        1                                Aortic valve                                Peak vel,  418 cm/s     -----                                S                                Mean vel,  299 cm/s     -----  S                                VTI, S     113 cm       -----                                Mean        40 mm Hg    -----                                gradient,                                S                                Peak        70 mm Hg    -----                                gradient,                                S                                VTI ratio  0.2          -----                                 LVOT/AV      2                                Area index 0.3 cm^2/m^2 -----                                (VTI)        9                                Peak vel   0.2          -----                                ratio,                                LVOT/AV                                Area index 0.3 cm^2/m^2 -----                                (  Vmax)       6                                Regurg PHT 548 ms       -----                                Mitral valve                                Peak E vel 97. cm/s     -----                                             1                                Peak A vel 146 cm/s     -----                                Mean vel,  79. cm/s     -----                                D            8                                Decelerati 422 ms       150-2                                on time                 30                                Pressure   127 ms       -----                                half-time                                Mean         3 mm Hg    -----                                gradient,                                D  Peak         9 mm Hg    -----                                gradient,                                D                                Peak E/A   0.7          -----                                ratio                                Area index 1.2 cm^2/m^2 -----                                (PHT)        1                                Area index 0.9 cm^2/m^2 -----                                (LVOT        7                                cont)                                Annulus    45. cm       -----                                VTI          5                                Tricuspid valve                                Regurg     274 cm/s     -----                                peak vel                                Peak RV-RA   30 mm Hg    -----  gradient,                                S                                Systemic veins                                Estimated    5 mm Hg    -----                                CVP                                Right ventricle                                Pressure,   35 mm Hg    <30                                S                                Sa vel,    9.2 cm/s     -----                                lat ann,     9                                tiss DP ------------------------------------------------------------ Prepared and Electronically Authenticated by Jenkins Rouge 2013-03-21T11:10:[id number]    Cardiac Catheterization Procedure Note  Name: CIERA VANTINE  MRN: [id number]  DOB: 08/30/1934  Procedure: Right Heart Cath, Selective Coronary Angiography  Indication:  Procedural Details: The right groin was prepped, draped, and anesthetized with 1% lidocaine. Using the modified Seldinger technique a 5 French sheath was placed in the right femoral artery and a 7 French sheath was placed in the right femoral vein. A Swan-Ganz catheter was used for the right heart catheterization. Standard protocol was followed for recording of right heart pressures and sampling of oxygen saturations. Fick cardiac output was calculated. Standard Judkins catheters were used for selective coronary angiography. There were no immediate procedural complications. The patient was transferred to the post catheterization recovery area for further monitoring.  Aortic valve not crossed, known severe aortic stenosis from high-quality echo.  Procedural Findings:  Hemodynamics (mmHg)  RA mean 6  RV 29/8  PA 30/11  PCWP mean 16  AO 147/58  Oxygen saturations:  PA 59%  AO 91%  Cardiac Output (Fick) 4.52 L/min  Cardiac Index (Fick) 3.12  Coronary angiography:  Coronary dominance: right  Left mainstem: 40% distal LM stenosis.  Left anterior descending (LAD):  Large D1 with 80% ostial stenosis. Diffuse moderate disease in the mid LAD reaching 75% stenosis.  Left circumflex (LCx): The OM1 was moderate with 95% proximal stenosis.  After OM1 takeoff, the AV LCx was relatively small with 90% mid vessel stenosis.  Right coronary artery (RCA): 30% proximal RCA stenosis, 30% mid RCA stenosis, 75% distal RCA stenosis just before bifurcation. The PDA is small and severely disease, up to 80% stenosis, in the proximal segment. The PLV is larger with about 30-40% mid vessel stenosis.  Left ventriculography: Left ventricular systolic function is normal, LVEF is estimated at 55-65%, there is no significant mitral regurgitation  Final Conclusions: Moderate to severe 3 vessel disease as above. LV and RV filling pressures are not significantly elevated. She has known severe aortic stenosis.  Recommendations: CVTS evaluation. She is frail-appearing but does all her ADLs, housework, cooking, etc. She has diffuse moderate to severe 3 vessel disease. She has significant CKD so we minimized contrast today (no LV-gram). She is small in stature so may not be ideal TAVR candidate, also has diffuse CAD which would be more easily revascularized surgically. She would be a good candidate for valve clinic.  Loralie Champagne  08/11/2011, 2:20 PM         Impression:  Severe symptomatic aortic stenosis with severe three-vessel coronary artery disease and relatively preserved left ventricular systolic function. The patient would clearly be fairly high risk for conventional aortic valve replacement and coronary artery bypass grafting. Using the STS calculator, the patient's anticipated risk of mortality would be 11% with 43% risk of morbidity or mortality, 23% risk of prolonged length of stay, 33% risk of prolonged ventilatory support, 5% risk of stroke, and 13.7% risk of renal failure.  Unfortunately, the patient would probably not be a very good candidate for transcatheter aortic valve  replacement do to the extensive nature of her underlying coronary artery disease. Although her aorta and iliac vessels not been formally imaged, I expect that she might not be candidate for transfemoral access either do to the presence of significant peripheral vascular disease and likely relatively small vessels.  Long-term prognosis with medical therapy would clearly be poor. The patient does not have other significant comorbid medical conditions which would likely shorten her life expectancy other than her underlying cardiac disease.   Plan:  I spent in excess of 45 minutes with the patient and her son here in the office today discussing the relative risks and benefits of conventional aortic valve replacement and coronary artery bypass grafting. We contrasted with her life expectancy with medical therapy. We also discussed transcatheter aortic valve replacement with or without percutaneous coronary intervention as an alternative approach. All her questions been addressed. The patient desires to think matters over before making a definitive decision. We will obtain a noncontrast CT scan of the thoracic aorta to evaluate the extent of calcification in her aorta which might affect her risks associated with conventional surgery. We will also obtain pulmonary function tests and a 6 minute walk test. We will review the patient's cath films with Dr. Burt Knack to assess the relative feasibility of treating the patient's coronary artery disease using percutaneous coronary intervention.  If he feels that percutaneous coronary intervention with transcatheter aortic valve replacement might be feasible, we will cancel the noncontrast CT scan of the aorta and plan a gated contrast enhanced scan to further evaluate the patient's candidacy for transcatheter therapy.  We will plan to see the patient back in one week's time to further discuss options.    Valentina Gu. Roxy Manns, MD 08/21/2011 11:14 AM

## 2011-08-23 ENCOUNTER — Other Ambulatory Visit (INDEPENDENT_AMBULATORY_CARE_PROVIDER_SITE_OTHER): Payer: Medicare Other

## 2011-08-23 DIAGNOSIS — E875 Hyperkalemia: Secondary | ICD-10-CM

## 2011-08-23 LAB — BASIC METABOLIC PANEL
BUN: 35 mg/dL — ABNORMAL HIGH (ref 6–23)
Calcium: 9.2 mg/dL (ref 8.4–10.5)
GFR: 39.91 mL/min — ABNORMAL LOW (ref >60.00)
Glucose, Bld: 214 mg/dL — ABNORMAL HIGH (ref 70–99)
Potassium: 4.8 mEq/L (ref 3.5–5.1)

## 2011-08-25 ENCOUNTER — Ambulatory Visit (HOSPITAL_COMMUNITY)
Admission: RE | Admit: 2011-08-25 | Discharge: 2011-08-25 | Disposition: A | Discharge status: Home / Self Care | Payer: Medicare Other | Source: Ambulatory Visit | Attending: Thoracic Surgery (Cardiothoracic Vascular Surgery) | Admitting: Thoracic Surgery (Cardiothoracic Vascular Surgery)

## 2011-08-25 DIAGNOSIS — R0602 Shortness of breath: Secondary | ICD-10-CM | POA: Insufficient documentation

## 2011-08-25 DIAGNOSIS — Z01818 Encounter for other preprocedural examination: Secondary | ICD-10-CM | POA: Insufficient documentation

## 2011-08-25 DIAGNOSIS — R079 Chest pain, unspecified: Secondary | ICD-10-CM | POA: Insufficient documentation

## 2011-08-25 DIAGNOSIS — I7090 Unspecified atherosclerosis: Secondary | ICD-10-CM | POA: Insufficient documentation

## 2011-08-25 DIAGNOSIS — I359 Nonrheumatic aortic valve disorder, unspecified: Secondary | ICD-10-CM | POA: Insufficient documentation

## 2011-08-25 LAB — PULMONARY FUNCTION TEST

## 2011-08-25 MED ORDER — ALBUTEROL SULFATE (5 MG/ML) 0.5% IN NEBU
2.5000 mg | INHALATION_SOLUTION | Freq: Once | RESPIRATORY_TRACT | Status: AC
  Administered 2011-08-25: 2.5 mg via RESPIRATORY_TRACT

## 2011-08-28 ENCOUNTER — Other Ambulatory Visit: Payer: Self-pay | Admitting: Thoracic Surgery (Cardiothoracic Vascular Surgery)

## 2011-08-28 ENCOUNTER — Encounter: Payer: Self-pay | Admitting: Thoracic Surgery (Cardiothoracic Vascular Surgery)

## 2011-08-28 ENCOUNTER — Ambulatory Visit (INDEPENDENT_AMBULATORY_CARE_PROVIDER_SITE_OTHER): Payer: Medicare Other | Admitting: Thoracic Surgery (Cardiothoracic Vascular Surgery)

## 2011-08-28 VITALS — BP 136/69 | HR 83 | Resp 16 | Ht 61.0 in | Wt 103.0 lb

## 2011-08-28 DIAGNOSIS — I350 Nonrheumatic aortic (valve) stenosis: Secondary | ICD-10-CM

## 2011-08-28 DIAGNOSIS — I2510 Atherosclerotic heart disease of native coronary artery without angina pectoris: Secondary | ICD-10-CM

## 2011-08-28 DIAGNOSIS — I359 Nonrheumatic aortic valve disorder, unspecified: Secondary | ICD-10-CM

## 2011-08-28 NOTE — Patient Instructions (Signed)
Temporarily stop taking aspirin in anticipation of surgery next week

## 2011-08-28 NOTE — Progress Notes (Signed)
[address]            [place] [address]          [phone]     CARDIOTHORACIC SURGERY OFFICE NOTE  Referring Provider is Larey Dresser, MD PCP is Loura Pardon, MD, MD   HPI:  Patient returns for followup of severe aortic stenosis and three-vessel coronary artery disease. She was originally seen in consultation one week ago. Since then she underwent CT scan of the chest and pulmonary function tests. She underwent a 6 minute walk test here in the office today. She returns to further discuss treatment options for management of severe symptomatic aortic stenosis and coronary artery disease. She reports no new problems or complaints   Current Outpatient Prescriptions  Medication Sig Dispense Refill  . albuterol (PROVENTIL HFA;VENTOLIN HFA) 108 (90 BASE) MCG/ACT inhaler Inhale 2 puffs into the lungs every 4 (four) hours as needed. For shortness of breath.      Marland Kitchen amLODipine (NORVASC) 10 MG tablet Take 10 mg by mouth daily.       Marland Kitchen aspirin 325 MG EC tablet Take 325 mg by mouth daily.        Marland Kitchen atorvastatin (LIPITOR) 40 MG tablet Take 40 mg by mouth daily.      Marland Kitchen FeFum-FePo-FA-B Cmp-C-Zn-Mn-Cu (SE-TAN PLUS) 162-115.2-1 MG CAPS Take 1 capsule by mouth daily.       . furosemide (LASIX) 20 MG tablet Take 20 mg by mouth daily.       Marland Kitchen glipiZIDE (GLUCOTROL XL) 10 MG 24 hr tablet Take 10 mg by mouth 2 (two) times daily.      . isosorbide mononitrate (IMDUR) 60 MG 24 hr tablet Take 60 mg by mouth 2 (two) times daily.      Marland Kitchen levothyroxine (SYNTHROID, LEVOTHROID) 50 MCG tablet Take 50 mcg by mouth daily.      . metoprolol (LOPRESSOR) 50 MG tablet Take 50 mg by mouth 2 (two) times daily.      . nitroGLYCERIN (NITROSTAT) 0.4 MG SL tablet Place 1 tablet (0.4 mg total) under the tongue every 5 (five) minutes x 3 doses as needed for chest pain.  25 tablet  3  . omeprazole (PRILOSEC) 20 MG capsule Take 20 mg by mouth daily.          Physical Exam:   BP 136/69   Pulse 83  Resp 16  Ht 5\' 1"  (1.549 m)  Wt 103 lb (46.72 kg)  BMI 19.46 kg/m2  SpO2 96%  General:  Thin, frail, well-appearing  Chest:   Clear to auscultation  CV:   Regular rate and rhythm with prominent systolic murmur  Incisions:  n/a  Abdomen:  Soft and nontender  Extremities:  Warm and well-perfused and no lower extremity edema  Diagnostic Tests:  CT CHEST WITHOUT CONTRAST 08/25/2011  Technique: Multidetector CT imaging of the chest was performed  following the standard protocol without IV contrast.  Comparison: Chest radiographs 09/24/2005. No recent examinations.  Findings: The aortic valve is heavily calcified. The valve appears  tricuspid on the reformatted images. There is no significant  dilatation of the aortic root which measures approximately 2.9 cm  in diameter. There is diffuse atherosclerosis of the aortic arch  and descending aorta. No focal aneurysm is identified.  There are dense mitral annular calcifications. There are diffuse  coronary artery calcifications. There is mild atherosclerosis of  the great vessels. The pulmonary arteries appear unremarkable as  imaged in the noncontrast state.  No enlarged mediastinal or hilar lymph nodes are identified. There  is a moderate sized hiatal hernia. There is no pleural or  pericardial effusion.  There is a small calcified right upper lobe granuloma on image 19.  There are additional tiny noncalcified nodules within the left  lower lobe on images 19 and 31.  The visualized upper abdomen is notable for diffuse  atherosclerosis. There is no adrenal mass. There are no acute or  suspicious osseous findings.  IMPRESSION:  1. Diffuse atherosclerosis of the aorta, great vessels and  coronary arteries as described.  2. The aortic valve is calcified and appears tricuspid.  3. Mitral annular calcifications.  4. Scattered calcified and noncalcified pulmonary nodules, all  likely to represent granulomas. If the patient is  at high risk for  bronchogenic carcinoma, follow-up chest CT at 1 year is  recommended. If the patient is at low risk, no follow-up is  needed. This recommendation follows the consensus statement:  Guidelines for Management of Small Pulmonary Nodules Detected on CT  Scans: A Statement from the [place] as published in  Radiology 2005; 237:395-400.  Original Report Authenticated By: Vivia Ewing, M.D.   PULMONARY FUNCTION TESTS  Summary function tests performed 08/25/2011 are reviewed and notable for FEV1 measured 0.89 L or 51% predicted. Forced vital capacity was 1.45 L or 62% predicted. There was slight incremental improvement with bronchodilator therapy. Diffusion capacity measured 62% predicted.  These results were interpreted to demonstrate the presence of severe obstructive airway disease with some probable restrictive process accounting for the perfusion defect.    6-MINUTE WALK TEST  The patient performed 6 minute walk test per protocol in the office today. She walked 270 m without any physical assistance and without requiring any rest stops. Her heart rate increased slightly from 83-94 beats per minute by the end of the walk. Oxygen saturation remained stable at 96%.    Impression:  Severe symptomatic aortic stenosis with severe three-vessel coronary artery disease and preserved left ventricular systolic function.  The patient will be very high-risk for surgical intervention. Prognosis with medical therapy is relatively poor. Do to the diffuse nature of the patient's underlying three-vessel coronary artery disease and the absence of adequate femoral access for transcatheter aortic valve replacement, conventional aortic valve replacement and coronary artery bypass grafting likely remains the patient's best option. She has sought matters over at length over the past week and desires to proceed with surgery.  Plan:  We plan to proceed with high-risk aortic valve  replacement and coronary artery bypass grafting on Wednesday, June 19. The patient has again been counseled at length regarding the indications, risks, and potential benefits of surgery.  The patient was counseled at length regarding surgical alternatives with respect to valve replacement. In particular, discussion was held comparing in contrast and the risks of mechanical valve replacement and the need for lifelong anticoagulation versus use of a bioprosthetic tissue valve and the associated potential for late structural valve deterioration in failure.  This discussion was placed in the context of the patient's particular circumstances, and as a result the patient specifically requests that their valve be replaced using a bioprosthetic tissue valve.  They understand and accept all potential associated risks of surgery including but not limited to risk of death, stroke, myocardial infarction, congestive heart failure, respiratory failure, renal failure, pneumonia, bleeding requiring blood transfusion and or reexploration, arrhythmia, heart  block or bradycardia requiring permanent pacemaker, pleural effusions or other delayed complications related to continued congestive heart failure, or other late complications related to valve replacement.  All questions have been answered.     Valentina Gu. Roxy Manns, MD 08/28/2011 2:09 PM

## 2011-08-29 ENCOUNTER — Other Ambulatory Visit: Payer: Self-pay

## 2011-08-29 ENCOUNTER — Encounter (HOSPITAL_COMMUNITY): Payer: Self-pay | Admitting: Pharmacy Technician

## 2011-08-29 DIAGNOSIS — I359 Nonrheumatic aortic valve disorder, unspecified: Secondary | ICD-10-CM

## 2011-08-29 DIAGNOSIS — I2510 Atherosclerotic heart disease of native coronary artery without angina pectoris: Secondary | ICD-10-CM

## 2011-08-30 ENCOUNTER — Ambulatory Visit: Payer: Medicare Other | Admitting: Cardiology

## 2011-08-31 ENCOUNTER — Encounter (HOSPITAL_COMMUNITY): Payer: Self-pay

## 2011-08-31 ENCOUNTER — Inpatient Hospital Stay (HOSPITAL_COMMUNITY): Admission: RE | Admit: 2011-08-31 | Discharge: 2011-08-31 | Payer: Medicare Other | Source: Ambulatory Visit

## 2011-08-31 HISTORY — DX: Unspecified visual loss: H54.7

## 2011-08-31 NOTE — Progress Notes (Signed)
Pt. Reports that she doesn't drive so she has a church member take her to appt.s' etc. Pt. States she does her own meds. by looking at the pills themselves, states the bottles have covers on the labels & she can't say the names anyway. Will discuss meds further when she comes for lab appt.

## 2011-09-04 ENCOUNTER — Ambulatory Visit (HOSPITAL_COMMUNITY)
Admission: RE | Admit: 2011-09-04 | Discharge: 2011-09-04 | Disposition: A | Discharge status: Home / Self Care | Payer: Medicare Other | Source: Ambulatory Visit | Attending: Thoracic Surgery (Cardiothoracic Vascular Surgery) | Admitting: Thoracic Surgery (Cardiothoracic Vascular Surgery)

## 2011-09-04 ENCOUNTER — Inpatient Hospital Stay (HOSPITAL_COMMUNITY): Admission: RE | Admit: 2011-09-04 | Payer: Medicare Other | Source: Ambulatory Visit

## 2011-09-04 ENCOUNTER — Encounter (HOSPITAL_COMMUNITY)
Admission: RE | Admit: 2011-09-04 | Discharge: 2011-09-04 | Disposition: A | Discharge status: Home / Self Care | Payer: Medicare Other | Source: Ambulatory Visit | Attending: Thoracic Surgery (Cardiothoracic Vascular Surgery) | Admitting: Thoracic Surgery (Cardiothoracic Vascular Surgery)

## 2011-09-04 VITALS — BP 153/56 | HR 65 | Temp 97.0°F | Resp 18 | Ht 61.0 in | Wt 105.0 lb

## 2011-09-04 DIAGNOSIS — Z01810 Encounter for preprocedural cardiovascular examination: Secondary | ICD-10-CM | POA: Insufficient documentation

## 2011-09-04 DIAGNOSIS — I2510 Atherosclerotic heart disease of native coronary artery without angina pectoris: Secondary | ICD-10-CM | POA: Insufficient documentation

## 2011-09-04 DIAGNOSIS — I350 Nonrheumatic aortic (valve) stenosis: Secondary | ICD-10-CM

## 2011-09-04 DIAGNOSIS — Z01818 Encounter for other preprocedural examination: Secondary | ICD-10-CM | POA: Insufficient documentation

## 2011-09-04 DIAGNOSIS — Z01812 Encounter for preprocedural laboratory examination: Secondary | ICD-10-CM | POA: Insufficient documentation

## 2011-09-04 DIAGNOSIS — I359 Nonrheumatic aortic valve disorder, unspecified: Secondary | ICD-10-CM

## 2011-09-04 DIAGNOSIS — I6529 Occlusion and stenosis of unspecified carotid artery: Secondary | ICD-10-CM

## 2011-09-04 LAB — URINALYSIS, ROUTINE W REFLEX MICROSCOPIC
Glucose, UA: NEGATIVE mg/dL
Protein, ur: NEGATIVE mg/dL
Urobilinogen, UA: 0.2 mg/dL (ref 0.0–1.0)

## 2011-09-04 LAB — COMPREHENSIVE METABOLIC PANEL
ALT: 11 U/L (ref 0–35)
Alkaline Phosphatase: 107 U/L (ref 39–117)
CO2: 16 mEq/L — ABNORMAL LOW (ref 19–32)
Calcium: 9 mg/dL (ref 8.4–10.5)
Chloride: 104 mEq/L (ref 96–112)
GFR calc Af Amer: 46 mL/min — ABNORMAL LOW (ref >90)
GFR calc non Af Amer: 39 mL/min — ABNORMAL LOW (ref >90)
Glucose, Bld: 204 mg/dL — ABNORMAL HIGH (ref 70–99)
Sodium: 136 mEq/L (ref 135–145)
Total Bilirubin: 0.3 mg/dL (ref 0.3–1.2)

## 2011-09-04 LAB — BLOOD GAS, ARTERIAL
Acid-base deficit: 2.6 mmol/L — ABNORMAL HIGH (ref 0.0–2.0)
Bicarbonate: 21.7 mEq/L (ref 20.0–24.0)
FIO2: 0.21 %
O2 Saturation: 96.6 %
pO2, Arterial: 86.6 mmHg (ref 80.0–100.0)

## 2011-09-04 LAB — CBC
Hemoglobin: 10.8 g/dL — ABNORMAL LOW (ref 12.0–15.0)
MCH: 29.8 pg (ref 26.0–34.0)
RBC: 3.62 MIL/uL — ABNORMAL LOW (ref 3.87–5.11)
WBC: 6.7 10*3/uL (ref 4.0–10.5)

## 2011-09-04 LAB — PROTIME-INR: Prothrombin Time: 12.7 seconds (ref 11.6–15.2)

## 2011-09-04 LAB — SURGICAL PCR SCREEN: MRSA, PCR: NEGATIVE

## 2011-09-04 MED ORDER — CHLORHEXIDINE GLUCONATE 4 % EX LIQD: 30.0000 mL | CUTANEOUS | Status: DC

## 2011-09-04 NOTE — Pre-Procedure Instructions (Signed)
[address] Barbara Garner  09/04/2011   Your procedure is scheduled on:  Wed, June 19 @ 8:30 AM  Report to [facility] at 6:30 AM.  Call this number if you have problems the morning of surgery: [phone]   Remember:   Do not eat food:After Midnight.  Take these medicines the morning of surgery with A SIP OF WATER: Albuterol<Bring Your Inhaler With You>,Amlodipine(Norvasc),Isosorbide(Imdur),Levothyroxine(Synthroid),Metoprolol(Lopressor),and Omeprazole(Prilosec)   Do not wear jewelry, make-up or nail polish.  Do not wear lotions, powders, or perfumes.   Do not shave 48 hours prior to surgery.   Do not bring valuables to the hospital.  Contacts, dentures or bridgework may not be worn into surgery.  Leave suitcase in the car. After surgery it may be brought to your room.  For patients admitted to the hospital, checkout time is 11:00 AM the day of discharge.   Patients discharged the day of surgery will not be allowed to drive home.    Special Instructions: Incentive Spirometry - Practice and bring it with you on the day of surgery. and CHG Shower Use Special Wash: 1/2 bottle night before surgery and 1/2 bottle morning of surgery.   Please read over the following fact sheets that you were given: Pain Booklet, Coughing and Deep Breathing, Blood Transfusion Information, Open Heart Packet, MRSA Information and Surgical Site Infection Prevention

## 2011-09-04 NOTE — Progress Notes (Signed)
VASCULAR LAB PRELIMINARY  PRELIMINARY  PRELIMINARY  PRELIMINARY  Pre-op Cardiac Surgery  Carotid Findings: Right - 40% to 59% ICA stenosis in the bulb. Left - No evidence of significant ICA stenosis. Bilateral ECA stenosis. Vertebral artery flow is antegrade bilaterally.  Upper Extremity Right Left  Brachial Pressures 139 Triphasic 142 Triphasic  Radial Waveforms Triphasic Triphasic  Ulnar Waveforms Biphasic Biphasic  Palmar Arch (Allen's Test) Abnormal Abnormal   Findings:  Right Doppler waveforms diminished greater than 50% with radial compression and remained normal with ulnar compression. Left doppler signals obliterated with radial compression and remained normal with ulnar compression.    Lower  Extremity Right Left  Dorsalis Pedis 143 Biphasic 101 Biphasic      Posterior Tibial 133 Biphasic 98 Biphasic  Ankle/Brachial Indices 1.0 0.71    Findings:  Right ABI is within normal limits. Left ABI indicates a moderate reduction in arterial flow   Shamya Macfadden D, RVS 09/04/2011, 4:53 PM

## 2011-09-05 MED ORDER — TRANEXAMIC ACID (OHS) PUMP PRIME SOLUTION
2.0000 mg/kg | INTRAVENOUS | Status: DC
  Filled 2011-09-05: qty 0.95

## 2011-09-05 MED ORDER — INSULIN REGULAR HUMAN 100 UNIT/ML IJ SOLN
INTRAMUSCULAR | Status: AC
  Administered 2011-09-06: 1.1 [IU]/h via INTRAVENOUS
  Filled 2011-09-05: qty 1

## 2011-09-05 MED ORDER — PHENYLEPHRINE HCL 10 MG/ML IJ SOLN
30.0000 ug/min | INTRAVENOUS | Status: AC
  Administered 2011-09-06: 5 ug/min via INTRAVENOUS
  Filled 2011-09-05: qty 2

## 2011-09-05 MED ORDER — PLASMA-LYTE 148 IV SOLN
INTRAVENOUS | Status: AC
  Administered 2011-09-06: 11:00:00
  Filled 2011-09-05: qty 2.5

## 2011-09-05 MED ORDER — MAGNESIUM SULFATE 50 % IJ SOLN
40.0000 meq | INTRAMUSCULAR | Status: DC
  Filled 2011-09-05: qty 10

## 2011-09-05 MED ORDER — METOPROLOL TARTRATE 12.5 MG HALF TABLET: 12.5000 mg | ORAL_TABLET | Freq: Once | ORAL | Status: DC

## 2011-09-05 MED ORDER — TRANEXAMIC ACID (OHS) BOLUS VIA INFUSION
15.0000 mg/kg | INTRAVENOUS | Status: DC
  Filled 2011-09-05: qty 714

## 2011-09-05 MED ORDER — NITROGLYCERIN IN D5W 200-5 MCG/ML-% IV SOLN
2.0000 ug/min | INTRAVENOUS | Status: AC
  Administered 2011-09-06: 5 ug/min via INTRAVENOUS
  Filled 2011-09-05: qty 250

## 2011-09-05 MED ORDER — DEXTROSE 5 % IV SOLN
750.0000 mg | INTRAVENOUS | Status: DC
  Filled 2011-09-05: qty 750

## 2011-09-05 MED ORDER — EPINEPHRINE HCL 1 MG/ML IJ SOLN
0.5000 ug/min | INTRAVENOUS | Status: DC
  Filled 2011-09-05: qty 4

## 2011-09-05 MED ORDER — POTASSIUM CHLORIDE 2 MEQ/ML IV SOLN
80.0000 meq | INTRAVENOUS | Status: DC
  Filled 2011-09-05: qty 40

## 2011-09-05 MED ORDER — SODIUM CHLORIDE 0.9 % IV SOLN
0.1000 ug/kg/h | INTRAVENOUS | Status: AC
  Administered 2011-09-06: .2 ug/kg/h via INTRAVENOUS
  Filled 2011-09-05: qty 4

## 2011-09-05 MED ORDER — DOPAMINE-DEXTROSE 3.2-5 MG/ML-% IV SOLN
2.0000 ug/kg/min | INTRAVENOUS | Status: DC
  Filled 2011-09-05: qty 250

## 2011-09-05 MED ORDER — TRANEXAMIC ACID 100 MG/ML IV SOLN
1.5000 mg/kg/h | INTRAVENOUS | Status: AC
  Administered 2011-09-06: 1.5 mg/kg/h via INTRAVENOUS
  Filled 2011-09-05: qty 25

## 2011-09-05 MED ORDER — VANCOMYCIN HCL 1000 MG IV SOLR
1000.0000 mg | INTRAVENOUS | Status: AC
  Administered 2011-09-06: 1000 mg via INTRAVENOUS
  Filled 2011-09-05: qty 1000

## 2011-09-05 MED ORDER — DEXTROSE 5 % IV SOLN
1.5000 g | INTRAVENOUS | Status: AC
  Administered 2011-09-06: 1.5 g via INTRAVENOUS
  Administered 2011-09-06: .75 g via INTRAVENOUS
  Filled 2011-09-05 (×2): qty 1.5

## 2011-09-05 NOTE — H&P (Signed)
CARDIOTHORACIC SURGERY HISTORY AND PHYSICAL EXAM  Referring Provider is Larey Dresser, MD PCP is Loura Pardon, MD, MD    Chief Complaint   Patient presents with   .  Coronary Artery Disease       Referral from Dr Aundra Dubin for eval on severe CAD, Cardiac Cath on 08/11/11     HPI:  Patient is a 77 year old widowed white female from [place], [place] with known history of aortic stenosis and coronary artery disease.  The patient underwent angioplasty in 1997 for unstable angina involving high-grade stenosis of a large diagonal branch. She has remained clinically stable from a cardiovascular standpoint since then. She developed progressive aortic stenosis which is now clearly severe and symptomatic. A followup echocardiogram performed in March confirmed the presence of severe aortic stenosis with peak velocity greater than 4 m/s and peak and mean transvalvular gradient estimated 70 and 40 mm mercury respectively. Left ventricular systolic function is preserved.  Over the past few years the patient has developed progressive exertional shortness of breath and fatigue. Recent months the patient reports that she gets short of breath with very mild physical activity. She gets severely short of breath with moderate activity and this has been associated with mild tightness across her chest. She denies any pain or shortness of breath occurring at rest. She has not had any nocturnal chest pain or shortness of breath. She denies PND, orthopnea, or lower extremity edema. She has not had any dizzy spells or syncope. Cardiac catheterization demonstrates severe three-vessel coronary artery disease. The patient has now been referred to consider possible aortic valve replacement and coronary artery bypass grafting.      Past Medical History  Diagnosis Date  . Iron deficiency anemia, unspecified   . Chronic diastolic CHF (congestive heart failure)   . CAD (coronary artery disease)     a.  Unstable angina  in 1997 with PTCA of large D1;  b. Myoview (9/10) at [facility] showed EF 84%, stable perfusion images with no ischemia.;  c. Cath 07/2011 - Severe Diffuse 3VD  . Type II or unspecified type diabetes mellitus without mention of complication, not stated as uncontrolled   . Contact dermatitis and other eczema, due to unspecified cause   . Esophageal reflux   . Headache   . Personal history of unspecified circulatory disease   . Pure hypercholesterolemia   . Unspecified essential hypertension   . Unspecified hypothyroidism   . Other specified erythematous condition   . Memory loss   . Unspecified hereditary and idiopathic peripheral neuropathy   . LBBB (left bundle branch block)   . Carotid stenosis     Carotid dopplers [id number] with [id number] LICA stenosis  . Severe aortic stenosis     a. 05/2011 Echo:  EF 55-65%, Gr1DD, Sev AS, Mild AI, PASP 24mmHg.  Marland Kitchen Extrinsic asthma, unspecified     hasn't used inhaler in past year  . CKD (chronic kidney disease) stage 4, GFR 15-29 ml/min     Cr 1.6 in 6/11, sees Dr. Arty Baumgartner  . Impaired vision     pt. reports that she identifies her meds by looking at the pillls, she is not able to read labels on bottles    Past Surgical History  Procedure Date  . Ptca [phone]    5 blockages  . Cardiac catheterization   . Esophageal dilation 1997  . Colonoscopy 04/1999    1 polyp  . Esophagogastroduodenoscopy 04/1999  HH; "watermelon stomach" (severe gastritis)  . Dexa 09/2005    Osteopenia  . Cataract extraction 2003    Bilateral  . Esophagogastroduodenoscopy 04/2002    Gastritis  . Cardiovascular stress test 10/2002    normal (per patient)  . Abdominal hysterectomy 1977    partial; fibroids  . Bladder tack 1977  . Dexa 10/2003    osteoporosis  . Colonoscopy 06/2004    Neg. Int hem  . Carotid dopplers 2006  . Admit 09/2005    n/v; renal failure  . Refractive surgery 2006  . Opthy 11/1998;12/01;11/02    Family History  Problem Relation Age of Onset  .  Stroke Father     died in his 70's  . Stroke Mother     died in her 71's    Social History History  Substance Use Topics  . Smoking status: Never Smoker   . Smokeless tobacco: Not on file  . Alcohol Use: No    Prior to Admission medications   Medication Sig Start Date End Date Taking? Authorizing Provider  albuterol (PROVENTIL HFA;VENTOLIN HFA) 108 (90 BASE) MCG/ACT inhaler Inhale 2 puffs into the lungs every 4 (four) hours as needed. For shortness of breath. 01/05/11  Yes Abner Greenspan, MD  amLODipine (NORVASC) 10 MG tablet Take 10 mg by mouth daily.    Yes Historical Provider, MD  atorvastatin (LIPITOR) 40 MG tablet Take 40 mg by mouth daily.   Yes Historical Provider, MD  furosemide (LASIX) 20 MG tablet Take 20 mg by mouth daily.  07/31/11  Yes Historical Provider, MD  glipiZIDE (GLUCOTROL XL) 10 MG 24 hr tablet Take 10 mg by mouth 2 (two) times daily. 07/31/11  Yes Abner Greenspan, MD  isosorbide mononitrate (IMDUR) 60 MG 24 hr tablet Take 60 mg by mouth 2 (two) times daily. 05/29/11  Yes Larey Dresser, MD  levothyroxine (SYNTHROID, LEVOTHROID) 50 MCG tablet Take 50 mcg by mouth daily.   Yes Historical Provider, MD  metoprolol (LOPRESSOR) 50 MG tablet Take 50 mg by mouth 2 (two) times daily. 07/31/11  Yes Abner Greenspan, MD  nitroGLYCERIN (NITROSTAT) 0.4 MG SL tablet Place 0.4 mg under the tongue every 5 (five) minutes x 3 doses as needed. For chest pain 08/11/11 08/10/12 Yes Rogelia Mire, NP  omeprazole (PRILOSEC) 20 MG capsule Take 20 mg by mouth daily. 07/31/11  Yes Abner Greenspan, MD  aspirin 325 MG EC tablet Take 325 mg by mouth daily.      Historical Provider, MD    Allergies  Allergen Reactions  . Metformin     REACTION: intolerant  . Promethazine Hcl     REACTION: u/k   Review of Systems:             General:                      normal appetite, decreased energy, no weight loss             Respiratory:                + dry non-productive cough worse with activity, no  wheezing, no hemoptysis, no pain with inspiration or cough, + exertional shortness of breath             Cardiac:                      + exertional chest pain or tightness, + exertional SOB -  functional class III, no resting SOB, no PND, no orthopnea, no LE edema, no palpitations, no syncope             GI:                                no difficulty swallowing, no hematochezia, no hematemesis, no melena, no constipation, no diarrhea               GU:                              no dysuria, no urgency, no frequency               Musculoskeletal:         no arthritis, no arthralgia               Vascular:                     no pain suggestive of claudication               Neuro:                         no symptoms suggestive of TIA's, no seizures, no headaches, no peripheral neuropathy               Endocrine:                   Negative               HEENT:                       Edentulous with full dentures,  + recent vision changes with gradual deterioration both eyes - 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             Psych:                         no anxiety, no depression                Physical Exam:              BP 119/56  Pulse 53  Resp 18  Ht 5\' 1"  (1.549 m)  Wt 103 lb (46.72 kg)  BMI 19.46 kg/m2  SpO2 93%             General:                      Thin, somewhat frail-appearing             HEENT:                       Unremarkable               Neck:                           no JVD, no bruits, no adenopathy               Chest:                         clear to auscultation, symmetrical breath sounds, no wheezes, no  rhonchi               CV:                              RRR, grade IV/VI crescendo/descrescendo systolic murmur               Abdomen:                    soft, non-tender, no masses               Extremities:                 warm, well-perfused, pulses diminished - palpable in groins but not at ankle, no LE edema             Rectal/GU                   Deferred              Neuro:                         Grossly non-focal and symmetrical throughout             Skin:                            Clean and dry, no rashes, no breakdown   Diagnostic Tests:  Transthoracic Echocardiography  Patient:    Barbara Garner, Barbara Garner MR #:       [id number] Study Date: 06/08/2011 Gender:     F Age:        69 Height:     152.4cm Weight:     48.1kg BSA:        1.66m^2 ------------------------------------------------------------ LV EF: 55% -   65% ------------------------------------------------------------ Indications:      Aortic stenosis /insufficiency 424.1. ------------------------------------------------------------ Study Conclusions  - Left ventricle: The cavity size was normal. Wall thickness   was increased in a pattern of moderate LVH. Systolic   function was normal. The estimated ejection fraction was   in the range of 55% to 65%. Wall motion was normal; there   were no regional wall motion abnormalities. Doppler   parameters are consistent with abnormal left ventricular   relaxation (grade 1 diastolic dysfunction). - Aortic valve: There was severe stenosis. Mild   regurgitation. - Mitral valve: Calcified annulus. The findings are   consistent with mild stenosis. Mild regurgitation. - Left atrium: The atrium was mildly dilated. - Atrial septum: No defect or patent foramen ovale was   identified. - Pulmonary arteries: PA peak pressure: 48mm Hg (S). ------------------------------------------------------------ -------------------------------------------------------- Left ventricle:  The cavity size was normal. Wall thickness was increased in a pattern of moderate LVH. Systolic function was normal. The estimated ejection fraction was in the range of 55% to 65%. Wall motion was normal; there were no regional wall motion abnormalities. Doppler parameters are consistent with abnormal left ventricular relaxation (grade 1 diastolic  dysfunction). ------------------------------------------------------------ Aortic valve:   Trileaflet.  Doppler:   There was severe stenosis.    Mild regurgitation.    VTI ratio of LVOT to aortic valve: 0.22. Indexed valve area: 0.39cm^2/m^2 (VTI). Peak velocity ratio of LVOT to aortic valve: 0.2. Indexed valve area: 0.36cm^2/m^2 (Vmax).    Mean gradient: 85mm Hg (S). Peak gradient: 66mm Hg (S). ------------------------------------------------------------ Aorta:  The aorta was normal,  not dilated, and non-diseased. ------------------------------------------------------------ Mitral valve:   Calcified annulus.  Doppler:   The findings are consistent with mild stenosis.    Mild regurgitation. Indexed valve area by pressure half-time: 1.21cm^2/m^2. Indexed valve area by continuity equation (using LVOT flow): 0.97cm^2/m^2.    Mean gradient: 47mm Hg (D). Peak gradient: 64mm Hg (D). ------------------------------------------------------------ Left atrium:  The atrium was mildly dilated. ------------------------------------------------------------ Atrial septum:  No defect or patent foramen ovale was identified. ------------------------------------------------------------ Right ventricle:  The cavity size was normal. Wall thickness was normal. Systolic function was normal. ------------------------------------------------------------ Pulmonic valve:    Structurally normal valve.   Cusp separation was normal.  Doppler:  Transvalvular velocity was within the normal range.  No significant regurgitation. ------------------------------------------------------------ Tricuspid valve:   Structurally normal valve.   Leaflet separation was normal.  Doppler:  Transvalvular velocity was within the normal range.  Mild regurgitation. ------------------------------------------------------------ Pulmonary artery:   The main pulmonary artery  was normal-sized. ------------------------------------------------------------ Right atrium:  The atrium was normal in size. ------------------------------------------------------------ Pericardium:  The pericardium was normal in appearance. ------------------------------------------------------------ Systemic veins: Inferior vena cava: The vessel was normal in size; the respirophasic diameter changes were in the normal range (= 50%); findings are consistent with normal central venous pressure. ------------------------------------------------------------ Post procedure conclusions Ascending Aorta:  - The aorta was normal, not dilated, and non-diseased. ------------------------------------------------------------ 2D measurements        Normal  Doppler measurements    Norma Left ventricle                                         l LVID ED,   29.9 mm     43-52   Main pulmonary artery chord,                         Pressure,   35 mm Hg    =30 PLAX                           S LVID ES,   21.2 mm     23-38   Left ventricle chord,                         Ea, lat    6.1 cm/s     ----- PLAX                           ann, tiss    4 FS, chord,   29 %      >29     DP PLAX                           E/Ea, lat  15.          ----- LVPW, ED   17.8 mm     ------  ann, tiss   81 IVS/LVPW   0.93        <1.3    DP ratio, ED                      Ea, med    2.6 cm/s     ----- Ventricular septum             ann, tiss    9  IVS, ED    16.6 mm     ------  DP LVOT                           E/Ea, med  36.          ----- Diam, S      18 mm     ------  ann, tiss    1 Area       2.54 cm^2   ------  DP Diam         18 mm     ------  LVOT Aorta                          Peak vel,  83. cm/s     ----- Root diam,   29 mm     ------  S            5 ED                             VTI, S     24. cm       ----- Left atrium                                 8 AP dim       41 mm     ------  HR          58 bpm       ----- AP dim     2.87 cm/m^2 <2.2    Stroke vol 63. ml       ----- index                                       1                                Cardiac    3.7 L/min    -----                                output                                Cardiac    2.6 L/(min-m -----                                index          ^2)                                Stroke     44. ml/m^2   -----                                index        1  Aortic valve                                Peak vel,  418 cm/s     -----                                S                                Mean vel,  299 cm/s     -----                                S                                VTI, S     113 cm       -----                                Mean        40 mm Hg    -----                                gradient,                                S                                Peak        70 mm Hg    -----                                gradient,                                S                                VTI ratio  0.2          -----                                LVOT/AV      2                                Area index 0.3 cm^2/m^2 -----                                (VTI)        9  Peak vel   0.2          -----                                ratio,                                LVOT/AV                                Area index 0.3 cm^2/m^2 -----                                (Vmax)       6                                Regurg PHT 548 ms       -----                                Mitral valve                                Peak E vel 97. cm/s     -----                                             1                                Peak A vel 146 cm/s     -----                                Mean vel,  79. cm/s     -----                                D            8                                Decelerati 422 ms       150-2                                 on time                 30                                Pressure   127 ms       -----  half-time                                Mean         3 mm Hg    -----                                gradient,                                D                                Peak         9 mm Hg    -----                                gradient,                                D                                Peak E/A   0.7          -----                                ratio                                Area index 1.2 cm^2/m^2 -----                                (PHT)        1                                Area index 0.9 cm^2/m^2 -----                                (LVOT        7                                cont)                                Annulus    45. cm       -----                                VTI          5  Tricuspid valve                                Regurg     274 cm/s     -----                                peak vel                                Peak RV-RA  30 mm Hg    -----                                gradient,                                S                                Systemic veins                                Estimated    5 mm Hg    -----                                CVP                                Right ventricle                                Pressure,   35 mm Hg    <30                                S                                Sa vel,    9.2 cm/s     -----                                lat ann,     9                                tiss DP ------------------------------------------------------------ Prepared and Electronically Authenticated by Jenkins Rouge 2013-03-21T11:10:15.363      Cardiac Catheterization Procedure Note  Name: BREAJAH STEINHARDT   MRN: [id number]   DOB: 02-20-1935   Procedure: Right Heart Cath, Selective Coronary Angiography   Indication:    Procedural Details: The right groin was prepped, draped, and anesthetized with 1% lidocaine. Using the modified Seldinger technique a 5 French sheath was placed in the right femoral artery and a 7 French sheath was placed in the right femoral vein. A Swan-Ganz catheter  was used for the right heart catheterization. Standard protocol was followed for recording of right heart pressures and sampling of oxygen saturations. Fick cardiac output was calculated. Standard Judkins catheters were used for selective coronary angiography. There were no immediate procedural complications. The patient was transferred to the post catheterization recovery area for further monitoring.   Aortic valve not crossed, known severe aortic stenosis from high-quality echo.   Procedural Findings:   Hemodynamics (mmHg)   RA mean 6   RV 29/8   PA 30/11   PCWP mean 16   AO 147/58   Oxygen saturations:   PA 59%   AO 91%   Cardiac Output (Fick) 4.52 L/min   Cardiac Index (Fick) 3.12   Coronary angiography:   Coronary dominance: right   Left mainstem: 40% distal LM stenosis.   Left anterior descending (LAD): Large D1 with 80% ostial stenosis. Diffuse moderate disease in the mid LAD reaching 75% stenosis.   Left circumflex (LCx): The OM1 was moderate with 95% proximal stenosis. After OM1 takeoff, the AV LCx was relatively small with 90% mid vessel stenosis.   Right coronary artery (RCA): 30% proximal RCA stenosis, 30% mid RCA stenosis, 75% distal RCA stenosis just before bifurcation. The PDA is small and severely disease, up to 80% stenosis, in the proximal segment. The PLV is larger with about 30-40% mid vessel stenosis.   Left ventriculography: Left ventricular systolic function is normal, LVEF is estimated at 55-65%, there is no significant mitral regurgitation   Final Conclusions: Moderate to severe 3 vessel disease as above. LV and RV filling pressures are not significantly elevated. She has known severe aortic stenosis.    Recommendations: CVTS evaluation. She is frail-appearing but does all her ADLs, housework, cooking, etc. She has diffuse moderate to severe 3 vessel disease. She has significant CKD so we minimized contrast today (no LV-gram). She is small in stature so may not be ideal TAVR candidate, also has diffuse CAD which would be more easily revascularized surgically. She would be a good candidate for valve clinic.   Loralie Champagne   08/11/2011, 2:20 PM           CT CHEST WITHOUT CONTRAST 08/25/2011  Technique: Multidetector CT imaging of the chest was performed   following the standard protocol without IV contrast.   Comparison: Chest radiographs 09/24/2005. No recent examinations.   Findings: The aortic valve is heavily calcified. The valve appears   tricuspid on the reformatted images. There is no significant   dilatation of the aortic root which measures approximately 2.9 cm   in diameter. There is diffuse atherosclerosis of the aortic arch   and descending aorta. No focal aneurysm is identified.   There are dense mitral annular calcifications. There are diffuse   coronary artery calcifications. There is mild atherosclerosis of   the great vessels. The pulmonary arteries appear unremarkable as   imaged in the noncontrast state.   No enlarged mediastinal or hilar lymph nodes are identified. There   is a moderate sized hiatal hernia. There is no pleural or   pericardial effusion.   There is a small calcified right upper lobe granuloma on image 19.   There are additional tiny noncalcified nodules within the left   lower lobe on images 19 and 31.   The visualized upper abdomen is notable for diffuse   atherosclerosis. There is no adrenal mass. There are no acute or   suspicious osseous findings.   IMPRESSION:   1. Diffuse atherosclerosis of  the aorta, great vessels and   coronary arteries as described.   2. The aortic valve is calcified and appears tricuspid.   3. Mitral annular  calcifications.   4. Scattered calcified and noncalcified pulmonary nodules, all   likely to represent granulomas. If the patient is at high risk for   bronchogenic carcinoma, follow-up chest CT at 1 year is   recommended. If the patient is at low risk, no follow-up is   needed. This recommendation follows the consensus statement:   Guidelines for Management of Small Pulmonary Nodules Detected on CT   Scans: A Statement from the [place] as published in   Radiology 2005; 237:395-400.   Original Report Authenticated By: Vivia Ewing, M.D.   PULMONARY FUNCTION TESTS  Summary function tests performed 08/25/2011 are reviewed and notable for FEV1 measured 0.89 L or 51% predicted. Forced vital capacity was 1.45 L or 62% predicted. There was slight incremental improvement with bronchodilator therapy. Diffusion capacity measured 62% predicted.  These results were interpreted to demonstrate the presence of severe obstructive airway disease with some probable restrictive process accounting for the perfusion defect.    6-MINUTE WALK TEST  The patient performed 6 minute walk test per protocol in the office today. She walked 270 m without any physical assistance and without requiring any rest stops. Her heart rate increased slightly from 83-94 beats per minute by the end of the walk. Oxygen saturation remained stable at 96%.   Impression:  Severe symptomatic aortic stenosis with severe three-vessel coronary artery disease and preserved left ventricular systolic function.  The patient will be very high-risk for surgical intervention. Prognosis with medical therapy is relatively poor. Do to the diffuse nature of the patient's underlying three-vessel coronary artery disease and the absence of adequate femoral access for transcatheter aortic valve replacement, conventional aortic valve replacement and coronary artery bypass grafting likely remains the patient's best option. She has sought  matters over at length over the past week and desires to proceed with surgery.  Plan:  We plan to proceed with high-risk aortic valve replacement and coronary artery bypass grafting on Wednesday, June 19. The patient has again been counseled at length regarding the indications, risks, and potential benefits of surgery.  The patient was counseled at length regarding surgical alternatives with respect to valve replacement. In particular, discussion was held comparing in contrast and the risks of mechanical valve replacement and the need for lifelong anticoagulation versus use of a bioprosthetic tissue valve and the associated potential for late structural valve deterioration in failure.  This discussion was placed in the context of the patient's particular circumstances, and as a result the patient specifically requests that their valve be replaced using a bioprosthetic tissue valve.  They understand and accept all potential associated risks of surgery including but not limited to risk of death, stroke, myocardial infarction, congestive heart failure, respiratory failure, renal failure, pneumonia, bleeding requiring blood transfusion and or reexploration, arrhythmia, heart block or bradycardia requiring permanent pacemaker, pleural effusions or other delayed complications related to continued congestive heart failure, or other late complications related to valve replacement.  All questions have been answered.      Valentina Gu. Roxy Manns, MD

## 2011-09-06 ENCOUNTER — Encounter (HOSPITAL_COMMUNITY): Payer: Self-pay | Admitting: Anesthesiology

## 2011-09-06 ENCOUNTER — Inpatient Hospital Stay (HOSPITAL_COMMUNITY): Payer: Medicare Other

## 2011-09-06 ENCOUNTER — Encounter (HOSPITAL_COMMUNITY)
Admission: RE | Disposition: A | Discharge status: Skilled Nursing Facility | Payer: Self-pay | Source: Ambulatory Visit | Attending: Thoracic Surgery (Cardiothoracic Vascular Surgery)

## 2011-09-06 ENCOUNTER — Inpatient Hospital Stay (HOSPITAL_COMMUNITY)
Admission: RE | Admit: 2011-09-06 | Discharge: 2011-09-20 | DRG: 219 | Disposition: A | Discharge status: Skilled Nursing Facility | Payer: Medicare Other | Source: Ambulatory Visit | Attending: Thoracic Surgery (Cardiothoracic Vascular Surgery) | Admitting: Thoracic Surgery (Cardiothoracic Vascular Surgery)

## 2011-09-06 ENCOUNTER — Ambulatory Visit (HOSPITAL_COMMUNITY): Payer: Medicare Other | Admitting: Anesthesiology

## 2011-09-06 ENCOUNTER — Encounter (HOSPITAL_COMMUNITY): Payer: Self-pay | Admitting: *Deleted

## 2011-09-06 DIAGNOSIS — I129 Hypertensive chronic kidney disease with stage 1 through stage 4 chronic kidney disease, or unspecified chronic kidney disease: Secondary | ICD-10-CM | POA: Diagnosis present

## 2011-09-06 DIAGNOSIS — R5381 Other malaise: Secondary | ICD-10-CM | POA: Diagnosis not present

## 2011-09-06 DIAGNOSIS — Z823 Family history of stroke: Secondary | ICD-10-CM

## 2011-09-06 DIAGNOSIS — E119 Type 2 diabetes mellitus without complications: Secondary | ICD-10-CM | POA: Diagnosis present

## 2011-09-06 DIAGNOSIS — E039 Hypothyroidism, unspecified: Secondary | ICD-10-CM | POA: Diagnosis present

## 2011-09-06 DIAGNOSIS — E7800 Pure hypercholesterolemia, unspecified: Secondary | ICD-10-CM | POA: Diagnosis present

## 2011-09-06 DIAGNOSIS — G609 Hereditary and idiopathic neuropathy, unspecified: Secondary | ICD-10-CM

## 2011-09-06 DIAGNOSIS — K219 Gastro-esophageal reflux disease without esophagitis: Secondary | ICD-10-CM | POA: Diagnosis present

## 2011-09-06 DIAGNOSIS — B372 Candidiasis of skin and nail: Secondary | ICD-10-CM | POA: Diagnosis present

## 2011-09-06 DIAGNOSIS — Z952 Presence of prosthetic heart valve: Secondary | ICD-10-CM

## 2011-09-06 DIAGNOSIS — Z9861 Coronary angioplasty status: Secondary | ICD-10-CM

## 2011-09-06 DIAGNOSIS — B9689 Other specified bacterial agents as the cause of diseases classified elsewhere: Secondary | ICD-10-CM | POA: Diagnosis not present

## 2011-09-06 DIAGNOSIS — I5033 Acute on chronic diastolic (congestive) heart failure: Secondary | ICD-10-CM | POA: Diagnosis not present

## 2011-09-06 DIAGNOSIS — J45909 Unspecified asthma, uncomplicated: Secondary | ICD-10-CM | POA: Diagnosis present

## 2011-09-06 DIAGNOSIS — D696 Thrombocytopenia, unspecified: Secondary | ICD-10-CM | POA: Diagnosis not present

## 2011-09-06 DIAGNOSIS — I2510 Atherosclerotic heart disease of native coronary artery without angina pectoris: Secondary | ICD-10-CM

## 2011-09-06 DIAGNOSIS — D62 Acute posthemorrhagic anemia: Secondary | ICD-10-CM | POA: Diagnosis not present

## 2011-09-06 DIAGNOSIS — I509 Heart failure, unspecified: Secondary | ICD-10-CM | POA: Diagnosis present

## 2011-09-06 DIAGNOSIS — N184 Chronic kidney disease, stage 4 (severe): Secondary | ICD-10-CM | POA: Diagnosis present

## 2011-09-06 DIAGNOSIS — I359 Nonrheumatic aortic valve disorder, unspecified: Principal | ICD-10-CM

## 2011-09-06 DIAGNOSIS — Z7982 Long term (current) use of aspirin: Secondary | ICD-10-CM

## 2011-09-06 DIAGNOSIS — I252 Old myocardial infarction: Secondary | ICD-10-CM

## 2011-09-06 DIAGNOSIS — J9819 Other pulmonary collapse: Secondary | ICD-10-CM | POA: Diagnosis not present

## 2011-09-06 DIAGNOSIS — I442 Atrioventricular block, complete: Secondary | ICD-10-CM

## 2011-09-06 DIAGNOSIS — I4891 Unspecified atrial fibrillation: Secondary | ICD-10-CM

## 2011-09-06 DIAGNOSIS — N170 Acute kidney failure with tubular necrosis: Secondary | ICD-10-CM | POA: Diagnosis not present

## 2011-09-06 DIAGNOSIS — N390 Urinary tract infection, site not specified: Secondary | ICD-10-CM | POA: Diagnosis not present

## 2011-09-06 DIAGNOSIS — Z951 Presence of aortocoronary bypass graft: Secondary | ICD-10-CM

## 2011-09-06 DIAGNOSIS — L259 Unspecified contact dermatitis, unspecified cause: Secondary | ICD-10-CM | POA: Diagnosis present

## 2011-09-06 DIAGNOSIS — Z79899 Other long term (current) drug therapy: Secondary | ICD-10-CM

## 2011-09-06 HISTORY — PX: AORTIC VALVE REPLACEMENT: SHX41

## 2011-09-06 HISTORY — PX: CORONARY ARTERY BYPASS GRAFT: SHX141

## 2011-09-06 LAB — POCT I-STAT 4, (NA,K, GLUC, HGB,HCT)
Glucose, Bld: 80 mg/dL (ref 70–99)
Glucose, Bld: 101 mg/dL — ABNORMAL HIGH (ref 70–99)
Glucose, Bld: 94 mg/dL (ref 70–99)
Glucose, Bld: 142 mg/dL — ABNORMAL HIGH (ref 70–99)
Glucose, Bld: 169 mg/dL — ABNORMAL HIGH (ref 70–99)
Glucose, Bld: 135 mg/dL — ABNORMAL HIGH (ref 70–99)
HCT: 27 % — ABNORMAL LOW (ref 36.0–46.0)
HCT: 23 % — ABNORMAL LOW (ref 36.0–46.0)
HCT: 26 % — ABNORMAL LOW (ref 36.0–46.0)
HCT: 27 % — ABNORMAL LOW (ref 36.0–46.0)
Hemoglobin: 9.2 g/dL — ABNORMAL LOW (ref 12.0–15.0)
Hemoglobin: 8.8 g/dL — ABNORMAL LOW (ref 12.0–15.0)
Hemoglobin: 7.8 g/dL — ABNORMAL LOW (ref 12.0–15.0)
Hemoglobin: 8.8 g/dL — ABNORMAL LOW (ref 12.0–15.0)
Hemoglobin: 9.2 g/dL — ABNORMAL LOW (ref 12.0–15.0)
Potassium: 3.6 mEq/L (ref 3.5–5.1)
Potassium: 4.3 mEq/L (ref 3.5–5.1)
Potassium: 3.7 mEq/L (ref 3.5–5.1)
Sodium: 143 mEq/L (ref 135–145)
Sodium: 144 mEq/L (ref 135–145)
Sodium: 140 mEq/L (ref 135–145)
Sodium: 139 mEq/L (ref 135–145)
Sodium: 141 mEq/L (ref 135–145)

## 2011-09-06 LAB — POCT I-STAT 3, ART BLOOD GAS (G3+)
Acid-base deficit: 2 mmol/L (ref 0.0–2.0)
Bicarbonate: 23.1 mEq/L (ref 20.0–24.0)
Bicarbonate: 18.4 mEq/L — ABNORMAL LOW (ref 20.0–24.0)
Patient temperature: 36.2
TCO2: 19 mmol/L (ref 0–100)
pCO2 arterial: 38.3 mmHg (ref 35.0–45.0)
pCO2 arterial: 30.3 mmHg — ABNORMAL LOW (ref 35.0–45.0)
pH, Arterial: 7.386 (ref 7.350–7.400)
pO2, Arterial: 320 mmHg — ABNORMAL HIGH (ref 80.0–100.0)
pO2, Arterial: 139 mmHg — ABNORMAL HIGH (ref 80.0–100.0)

## 2011-09-06 LAB — POCT I-STAT GLUCOSE: Glucose, Bld: 77 mg/dL (ref 70–99)

## 2011-09-06 LAB — CREATININE, SERUM: GFR calc non Af Amer: 67 mL/min — ABNORMAL LOW (ref >90)

## 2011-09-06 LAB — POCT I-STAT, CHEM 8
Chloride: 105 mEq/L (ref 96–112)
Creatinine, Ser: 0.9 mg/dL (ref 0.50–1.10)
Glucose, Bld: 162 mg/dL — ABNORMAL HIGH (ref 70–99)
HCT: 41 % (ref 36.0–46.0)
Potassium: 4.1 mEq/L (ref 3.5–5.1)

## 2011-09-06 LAB — CBC
HCT: 29.8 % — ABNORMAL LOW (ref 36.0–46.0)
HCT: 40.2 % (ref 36.0–46.0)
MCH: 30.8 pg (ref 26.0–34.0)
MCHC: 36.6 g/dL — ABNORMAL HIGH (ref 30.0–36.0)
MCV: 87.4 fL (ref 78.0–100.0)
MCV: 85.4 fL (ref 78.0–100.0)
Platelets: 89 10*3/uL — ABNORMAL LOW (ref 150–400)
RBC: 3.41 MIL/uL — ABNORMAL LOW (ref 3.87–5.11)
RDW: 14.6 % (ref 11.5–15.5)
WBC: 1.5 10*3/uL — ABNORMAL LOW (ref 4.0–10.5)

## 2011-09-06 LAB — HEMOGLOBIN AND HEMATOCRIT, BLOOD: HCT: 24 % — ABNORMAL LOW (ref 36.0–46.0)

## 2011-09-06 LAB — APTT: aPTT: 42 seconds — ABNORMAL HIGH (ref 24–37)

## 2011-09-06 SURGERY — REPLACEMENT, AORTIC VALVE, OPEN
Anesthesia: General | Site: Chest | Wound class: Clean

## 2011-09-06 MED ORDER — SODIUM CHLORIDE 0.9 % IV SOLN
INTRAVENOUS | Status: DC | PRN
  Administered 2011-09-06: 15:00:00 via INTRAVENOUS

## 2011-09-06 MED ORDER — LACTATED RINGERS IV SOLN
INTRAVENOUS | Status: DC | PRN
  Administered 2011-09-06 (×2): via INTRAVENOUS

## 2011-09-06 MED ORDER — BISACODYL 10 MG RE SUPP
10.0000 mg | Freq: Every day | RECTAL | Status: DC
Start: 2011-09-07 — End: 2011-09-20
  Administered 2011-09-07: 10 mg via RECTAL
  Filled 2011-09-06 (×2): qty 1

## 2011-09-06 MED ORDER — INSULIN ASPART 100 UNIT/ML ~~LOC~~ SOLN
0.0000 [IU] | SUBCUTANEOUS | Status: AC
  Administered 2011-09-06: 4 [IU] via SUBCUTANEOUS
  Administered 2011-09-07: 2 [IU] via SUBCUTANEOUS

## 2011-09-06 MED ORDER — DOPAMINE-DEXTROSE 3.2-5 MG/ML-% IV SOLN
3.0000 ug/kg/min | INTRAVENOUS | Status: DC
  Administered 2011-09-06: 3 ug/kg/min via INTRAVENOUS
  Filled 2011-09-06: qty 250

## 2011-09-06 MED ORDER — MUPIROCIN 2 % EX OINT
TOPICAL_OINTMENT | Freq: Two times a day (BID) | CUTANEOUS | Status: DC
  Administered 2011-09-06 – 2011-09-08 (×5): via NASAL
  Administered 2011-09-09: 1 via NASAL
  Administered 2011-09-09 – 2011-09-14 (×10): via NASAL
  Administered 2011-09-14: 1 via NASAL
  Administered 2011-09-15 – 2011-09-17 (×5): via NASAL
  Administered 2011-09-18: 1 via NASAL
  Administered 2011-09-18 – 2011-09-19 (×2): via NASAL
  Administered 2011-09-19: 1 via NASAL
  Administered 2011-09-20: 10:00:00 via NASAL
  Filled 2011-09-06 (×3): qty 22

## 2011-09-06 MED ORDER — METOPROLOL TARTRATE 25 MG/10 ML ORAL SUSPENSION
12.5000 mg | Freq: Two times a day (BID) | ORAL | Status: DC
  Filled 2011-09-06 (×3): qty 5

## 2011-09-06 MED ORDER — ROCURONIUM BROMIDE 100 MG/10ML IV SOLN
INTRAVENOUS | Status: DC | PRN
  Administered 2011-09-06: 50 mg via INTRAVENOUS

## 2011-09-06 MED ORDER — ACETAMINOPHEN 650 MG RE SUPP
650.0000 mg | RECTAL | Status: AC
  Administered 2011-09-06: 650 mg via RECTAL

## 2011-09-06 MED ORDER — MAGNESIUM SULFATE 40 MG/ML IJ SOLN
4.0000 g | Freq: Once | INTRAMUSCULAR | Status: AC
  Administered 2011-09-06: 4 g via INTRAVENOUS
  Filled 2011-09-06: qty 100

## 2011-09-06 MED ORDER — OXYCODONE HCL 5 MG PO TABS: 5.0000 mg | ORAL_TABLET | ORAL | Status: DC | PRN

## 2011-09-06 MED ORDER — ACETAMINOPHEN 160 MG/5ML PO SOLN
975.0000 mg | Freq: Four times a day (QID) | ORAL | Status: DC
  Administered 2011-09-06 – 2011-09-07 (×2): 975 mg
  Filled 2011-09-06 (×2): qty 40.6

## 2011-09-06 MED ORDER — ALBUMIN HUMAN 5 % IV SOLN
INTRAVENOUS | Status: DC | PRN
  Administered 2011-09-06: 15:00:00 via INTRAVENOUS

## 2011-09-06 MED ORDER — SODIUM CHLORIDE 0.9 % IJ SOLN
OROMUCOSAL | Status: DC | PRN
  Administered 2011-09-06 (×3): via TOPICAL

## 2011-09-06 MED ORDER — SODIUM CHLORIDE 0.9 % IV SOLN
0.1000 ug/kg/h | INTRAVENOUS | Status: DC
  Filled 2011-09-06: qty 2

## 2011-09-06 MED ORDER — ASPIRIN EC 325 MG PO TBEC
325.0000 mg | DELAYED_RELEASE_TABLET | Freq: Every day | ORAL | Status: DC
  Administered 2011-09-08 – 2011-09-10 (×3): 325 mg via ORAL
  Filled 2011-09-06 (×6): qty 1

## 2011-09-06 MED ORDER — SODIUM CHLORIDE 0.9 % IV SOLN
INTRAVENOUS | Status: DC
  Administered 2011-09-08: 10:00:00 via INTRAVENOUS
  Administered 2011-09-08: 20 mL/h via INTRAVENOUS

## 2011-09-06 MED ORDER — DOCUSATE SODIUM 100 MG PO CAPS
200.0000 mg | ORAL_CAPSULE | Freq: Every day | ORAL | Status: DC
  Administered 2011-09-08 – 2011-09-20 (×11): 200 mg via ORAL
  Filled 2011-09-06 (×10): qty 2

## 2011-09-06 MED ORDER — SODIUM CHLORIDE 0.9 % IV SOLN: 250.0000 mL | INTRAVENOUS | Status: DC

## 2011-09-06 MED ORDER — VANCOMYCIN HCL IN DEXTROSE 1-5 GM/200ML-% IV SOLN
1000.0000 mg | Freq: Once | INTRAVENOUS | Status: AC
  Administered 2011-09-07: 1000 mg via INTRAVENOUS
  Filled 2011-09-06: qty 200

## 2011-09-06 MED ORDER — FAMOTIDINE IN NACL 20-0.9 MG/50ML-% IV SOLN
20.0000 mg | Freq: Two times a day (BID) | INTRAVENOUS | Status: AC
  Administered 2011-09-06 – 2011-09-07 (×2): 20 mg via INTRAVENOUS
  Filled 2011-09-06 (×2): qty 50

## 2011-09-06 MED ORDER — SUFENTANIL CITRATE 50 MCG/ML IV SOLN
INTRAVENOUS | Status: DC | PRN
  Administered 2011-09-06: 25 ug via INTRAVENOUS
  Administered 2011-09-06: 5 ug via INTRAVENOUS
  Administered 2011-09-06: 40 ug via INTRAVENOUS
  Administered 2011-09-06 (×2): 30 ug via INTRAVENOUS

## 2011-09-06 MED ORDER — ACETAMINOPHEN 500 MG PO TABS
1000.0000 mg | ORAL_TABLET | Freq: Four times a day (QID) | ORAL | Status: AC
  Administered 2011-09-07 – 2011-09-10 (×14): 1000 mg via ORAL
  Filled 2011-09-06 (×18): qty 2

## 2011-09-06 MED ORDER — INSULIN ASPART 100 UNIT/ML ~~LOC~~ SOLN
0.0000 [IU] | SUBCUTANEOUS | Status: DC
  Administered 2011-09-07 (×2): 2 [IU] via SUBCUTANEOUS
  Administered 2011-09-07: 4 [IU] via SUBCUTANEOUS
  Administered 2011-09-07: 2 [IU] via SUBCUTANEOUS

## 2011-09-06 MED ORDER — ACETAMINOPHEN 160 MG/5ML PO SOLN: 650.0000 mg | ORAL | Status: AC

## 2011-09-06 MED ORDER — MORPHINE SULFATE 2 MG/ML IJ SOLN: 1.0000 mg | INTRAMUSCULAR | Status: AC | PRN

## 2011-09-06 MED ORDER — MIDAZOLAM HCL 5 MG/5ML IJ SOLN
INTRAMUSCULAR | Status: DC | PRN
  Administered 2011-09-06: 1 mg via INTRAVENOUS
  Administered 2011-09-06: 2 mg via INTRAVENOUS
  Administered 2011-09-06: 3 mg via INTRAVENOUS

## 2011-09-06 MED ORDER — METOPROLOL TARTRATE 12.5 MG HALF TABLET
12.5000 mg | ORAL_TABLET | Freq: Two times a day (BID) | ORAL | Status: DC
  Filled 2011-09-06 (×3): qty 1

## 2011-09-06 MED ORDER — VECURONIUM BROMIDE 10 MG IV SOLR
INTRAVENOUS | Status: DC | PRN
  Administered 2011-09-06: 5 mg via INTRAVENOUS
  Administered 2011-09-06: 10 mg via INTRAVENOUS

## 2011-09-06 MED ORDER — METOPROLOL TARTRATE 50 MG PO TABS
ORAL_TABLET | ORAL | Status: AC
  Filled 2011-09-06: qty 1

## 2011-09-06 MED ORDER — LEVOTHYROXINE SODIUM 50 MCG PO TABS
50.0000 ug | ORAL_TABLET | Freq: Every day | ORAL | Status: DC
  Administered 2011-09-07 – 2011-09-20 (×14): 50 ug via ORAL
  Filled 2011-09-06 (×15): qty 1

## 2011-09-06 MED ORDER — ASPIRIN 81 MG PO CHEW
324.0000 mg | CHEWABLE_TABLET | Freq: Every day | ORAL | Status: DC
  Administered 2011-09-07: 324 mg
  Filled 2011-09-06: qty 4

## 2011-09-06 MED ORDER — INSULIN REGULAR BOLUS VIA INFUSION
0.0000 [IU] | Freq: Three times a day (TID) | INTRAVENOUS | Status: DC
  Filled 2011-09-06: qty 10

## 2011-09-06 MED ORDER — BISACODYL 5 MG PO TBEC
10.0000 mg | DELAYED_RELEASE_TABLET | Freq: Every day | ORAL | Status: DC
Start: 2011-09-07 — End: 2011-09-20
  Administered 2011-09-08 – 2011-09-20 (×8): 10 mg via ORAL
  Filled 2011-09-06 (×8): qty 2

## 2011-09-06 MED ORDER — POTASSIUM CHLORIDE 10 MEQ/50ML IV SOLN
10.0000 meq | INTRAVENOUS | Status: AC
  Administered 2011-09-06 (×3): 10 meq via INTRAVENOUS

## 2011-09-06 MED ORDER — CALCIUM CHLORIDE 10 % IV SOLN
1.0000 g | Freq: Once | INTRAVENOUS | Status: AC | PRN
  Filled 2011-09-06: qty 10

## 2011-09-06 MED ORDER — SODIUM CHLORIDE 0.9 % IJ SOLN
3.0000 mL | INTRAMUSCULAR | Status: DC | PRN
  Administered 2011-09-12 – 2011-09-15 (×3): 3 mL via INTRAVENOUS

## 2011-09-06 MED ORDER — ONDANSETRON HCL 4 MG/2ML IJ SOLN
4.0000 mg | Freq: Four times a day (QID) | INTRAMUSCULAR | Status: DC | PRN
  Administered 2011-09-07 (×2): 4 mg via INTRAVENOUS
  Filled 2011-09-06 (×2): qty 2

## 2011-09-06 MED ORDER — SODIUM CHLORIDE 0.9 % IV SOLN
INTRAVENOUS | Status: DC
  Filled 2011-09-06: qty 1

## 2011-09-06 MED ORDER — PROPOFOL 10 MG/ML IV EMUL
INTRAVENOUS | Status: DC | PRN
  Administered 2011-09-06: 100 mg via INTRAVENOUS

## 2011-09-06 MED ORDER — PHENYLEPHRINE HCL 10 MG/ML IJ SOLN
0.0000 ug/min | INTRAVENOUS | Status: DC
  Filled 2011-09-06: qty 2

## 2011-09-06 MED ORDER — HEPARIN SODIUM (PORCINE) 1000 UNIT/ML IJ SOLN
INTRAMUSCULAR | Status: DC | PRN
  Administered 2011-09-06: 2000 [IU] via INTRAVENOUS
  Administered 2011-09-06: 15000 [IU] via INTRAVENOUS

## 2011-09-06 MED ORDER — DEXTROSE 5 % IV SOLN
1.5000 g | Freq: Two times a day (BID) | INTRAVENOUS | Status: AC
  Administered 2011-09-06 – 2011-09-08 (×4): 1.5 g via INTRAVENOUS
  Filled 2011-09-06 (×4): qty 1.5

## 2011-09-06 MED ORDER — MIDAZOLAM HCL 2 MG/2ML IJ SOLN: 2.0000 mg | INTRAMUSCULAR | Status: DC | PRN

## 2011-09-06 MED ORDER — SODIUM CHLORIDE 0.9 % IJ SOLN
3.0000 mL | Freq: Two times a day (BID) | INTRAMUSCULAR | Status: DC
  Administered 2011-09-07 – 2011-09-11 (×8): 3 mL via INTRAVENOUS

## 2011-09-06 MED ORDER — NITROGLYCERIN IN D5W 200-5 MCG/ML-% IV SOLN
0.0000 ug/min | INTRAVENOUS | Status: DC
  Administered 2011-09-06: 5 ug/min via INTRAVENOUS
  Administered 2011-09-07: 10 ug/min via INTRAVENOUS
  Filled 2011-09-06: qty 250

## 2011-09-06 MED ORDER — SODIUM CHLORIDE 0.45 % IV SOLN
INTRAVENOUS | Status: DC
  Administered 2011-09-06: 17:00:00 via INTRAVENOUS

## 2011-09-06 MED ORDER — LACTATED RINGERS IV SOLN
INTRAVENOUS | Status: DC
  Administered 2011-09-06: 17:00:00 via INTRAVENOUS

## 2011-09-06 MED ORDER — SODIUM CHLORIDE 0.9 % IR SOLN
Status: DC | PRN
  Administered 2011-09-06: 6000 mL

## 2011-09-06 MED ORDER — PROTAMINE SULFATE 10 MG/ML IV SOLN
INTRAVENOUS | Status: DC | PRN
  Administered 2011-09-06: 120 mg via INTRAVENOUS

## 2011-09-06 MED ORDER — MORPHINE SULFATE 2 MG/ML IJ SOLN: 2.0000 mg | INTRAMUSCULAR | Status: DC | PRN

## 2011-09-06 MED ORDER — ALBUMIN HUMAN 5 % IV SOLN
250.0000 mL | INTRAVENOUS | Status: AC | PRN
  Administered 2011-09-06 – 2011-09-07 (×3): 250 mL via INTRAVENOUS
  Filled 2011-09-06: qty 250

## 2011-09-06 MED ORDER — PANTOPRAZOLE SODIUM 40 MG PO TBEC
40.0000 mg | DELAYED_RELEASE_TABLET | Freq: Every day | ORAL | Status: DC
  Administered 2011-09-08 – 2011-09-20 (×10): 40 mg via ORAL
  Filled 2011-09-06 (×11): qty 1

## 2011-09-06 MED ORDER — METOPROLOL TARTRATE 1 MG/ML IV SOLN
2.5000 mg | INTRAVENOUS | Status: DC | PRN
  Administered 2011-09-11: 2.5 mg via INTRAVENOUS
  Filled 2011-09-06: qty 5

## 2011-09-06 MED ORDER — METOPROLOL TARTRATE 50 MG PO TABS
50.0000 mg | ORAL_TABLET | Freq: Once | ORAL | Status: AC
  Administered 2011-09-06: 50 mg via ORAL
  Filled 2011-09-06: qty 1

## 2011-09-06 SURGICAL SUPPLY — 139 items
ADAPTER CARDIO PERF ANTE/RETRO (ADAPTER) ×2 IMPLANT
ADH SKN CLS APL DERMABOND .7 (GAUZE/BANDAGES/DRESSINGS) ×2
ADH SRG 12 PREFL SYR 3 SPRDR (MISCELLANEOUS)
ADPR PRFSN 84XANTGRD RTRGD (ADAPTER) ×1
APL SKNCLS STERI-STRIP NONHPOA (GAUZE/BANDAGES/DRESSINGS) ×1
APPLICATOR TIP BIOGLUE STANDRD (MISCELLANEOUS) IMPLANT
APPLIER CLIP 9.375 MED OPEN (MISCELLANEOUS)
APPLIER CLIP 9.375 SM OPEN (CLIP)
APR CLP MED 9.3 20 MLT OPN (MISCELLANEOUS)
APR CLP SM 9.3 20 MLT OPN (CLIP)
ATTRACTOMAT 16X20 MAGNETIC DRP (DRAPES) ×2 IMPLANT
BAG DECANTER FOR FLEXI CONT (MISCELLANEOUS) ×2 IMPLANT
BANDAGE ELASTIC 4 VELCRO ST LF (GAUZE/BANDAGES/DRESSINGS) ×2 IMPLANT
BANDAGE ELASTIC 6 VELCRO ST LF (GAUZE/BANDAGES/DRESSINGS) ×2 IMPLANT
BANDAGE GAUZE ELAST BULKY 4 IN (GAUZE/BANDAGES/DRESSINGS) ×2 IMPLANT
BASKET HEART (ORDER IN 25'S) (MISCELLANEOUS) ×1
BASKET HEART (ORDER IN 25S) (MISCELLANEOUS) ×1 IMPLANT
BENZOIN TINCTURE PRP APPL 2/3 (GAUZE/BANDAGES/DRESSINGS) ×2 IMPLANT
BLADE STERNUM SYSTEM 6 (BLADE) ×2 IMPLANT
BLADE SURG 11 STRL SS (BLADE) ×3 IMPLANT
BLADE SURG ROTATE 9660 (MISCELLANEOUS) IMPLANT
CANISTER SUCTION 2500CC (MISCELLANEOUS) ×2 IMPLANT
CANNULA GUNDRY RCSP 15FR (MISCELLANEOUS) ×2 IMPLANT
CATH CPB KIT OWEN (MISCELLANEOUS) ×2 IMPLANT
CATH HEART VENT LEFT (CATHETERS) ×1 IMPLANT
CATH THORACIC 28FR (CATHETERS) IMPLANT
CATH THORACIC 28FR RT ANG (CATHETERS) IMPLANT
CATH THORACIC 36FR (CATHETERS) ×2 IMPLANT
CATH THORACIC 36FR RT ANG (CATHETERS) ×2 IMPLANT
CLIP APPLIE 9.375 MED OPEN (MISCELLANEOUS) IMPLANT
CLIP APPLIE 9.375 SM OPEN (CLIP) IMPLANT
CLIP FOGARTY SPRING 6M (CLIP) IMPLANT
CLIP TI MEDIUM 24 (CLIP) IMPLANT
CLIP TI WIDE RED SMALL 24 (CLIP) IMPLANT
CLOTH BEACON ORANGE TIMEOUT ST (SAFETY) ×2 IMPLANT
CONN Y 3/8X3/8X3/8  BEN (MISCELLANEOUS)
CONN Y 3/8X3/8X3/8 BEN (MISCELLANEOUS) IMPLANT
COVER SURGICAL LIGHT HANDLE (MISCELLANEOUS) ×4 IMPLANT
CRADLE DONUT ADULT HEAD (MISCELLANEOUS) ×2 IMPLANT
DERMABOND ADVANCED (GAUZE/BANDAGES/DRESSINGS) ×2
DERMABOND ADVANCED .7 DNX12 (GAUZE/BANDAGES/DRESSINGS) IMPLANT
DRAIN CHANNEL 32F RND 10.7 FF (WOUND CARE) ×2 IMPLANT
DRAPE CARDIOVASCULAR INCISE (DRAPES) ×2
DRAPE INCISE IOBAN 66X45 STRL (DRAPES) ×2 IMPLANT
DRAPE SLUSH/WARMER DISC (DRAPES) ×2 IMPLANT
DRAPE SRG 135X102X78XABS (DRAPES) ×1 IMPLANT
DRSG COVADERM 4X14 (GAUZE/BANDAGES/DRESSINGS) ×2 IMPLANT
ELECT REM PT RETURN 9FT ADLT (ELECTROSURGICAL) ×4
ELECTRODE REM PT RTRN 9FT ADLT (ELECTROSURGICAL) ×2 IMPLANT
GLOVE BIO SURGEON STRL SZ 6 (GLOVE) ×2 IMPLANT
GLOVE BIO SURGEON STRL SZ 6.5 (GLOVE) ×2 IMPLANT
GLOVE BIO SURGEON STRL SZ7 (GLOVE) ×2 IMPLANT
GLOVE BIO SURGEON STRL SZ7.5 (GLOVE) IMPLANT
GLOVE BIOGEL PI IND STRL 6 (GLOVE) IMPLANT
GLOVE BIOGEL PI IND STRL 6.5 (GLOVE) IMPLANT
GLOVE BIOGEL PI IND STRL 7.0 (GLOVE) IMPLANT
GLOVE BIOGEL PI INDICATOR 6 (GLOVE)
GLOVE BIOGEL PI INDICATOR 6.5 (GLOVE)
GLOVE BIOGEL PI INDICATOR 7.0 (GLOVE)
GLOVE EUDERMIC 7 POWDERFREE (GLOVE) IMPLANT
GLOVE ORTHO TXT STRL SZ7.5 (GLOVE) ×6 IMPLANT
GOWN STRL NON-REIN LRG LVL3 (GOWN DISPOSABLE) ×8 IMPLANT
HEMOSTAT POWDER SURGIFOAM 1G (HEMOSTASIS) ×6 IMPLANT
INSERT FOGARTY 61MM (MISCELLANEOUS) IMPLANT
INSERT FOGARTY XLG (MISCELLANEOUS) ×2 IMPLANT
KIT BASIN OR (CUSTOM PROCEDURE TRAY) ×2 IMPLANT
KIT ROOM TURNOVER OR (KITS) ×2 IMPLANT
KIT SUCTION CATH 14FR (SUCTIONS) ×10 IMPLANT
KIT VASOVIEW W/TROCAR VH 2000 (KITS) ×2 IMPLANT
LEAD PACING MYOCARDI (MISCELLANEOUS) ×2 IMPLANT
LINE VENT (MISCELLANEOUS) ×1 IMPLANT
MARKER GRAFT CORONARY BYPASS (MISCELLANEOUS) ×6 IMPLANT
NS IRRIG 1000ML POUR BTL (IV SOLUTION) ×10 IMPLANT
PACK OPEN HEART (CUSTOM PROCEDURE TRAY) ×2 IMPLANT
PAD ARMBOARD 7.5X6 YLW CONV (MISCELLANEOUS) ×4 IMPLANT
PENCIL BUTTON HOLSTER BLD 10FT (ELECTRODE) ×2 IMPLANT
PUNCH AORTIC ROTATE 4.0MM (MISCELLANEOUS) ×1 IMPLANT
PUNCH AORTIC ROTATE 4.5MM 8IN (MISCELLANEOUS) IMPLANT
PUNCH AORTIC ROTATE 5MM 8IN (MISCELLANEOUS) IMPLANT
SET CARDIOPLEGIA MPS 5001102 (MISCELLANEOUS) ×1 IMPLANT
SET IRRIG TUBING LAPAROSCOPIC (IRRIGATION / IRRIGATOR) ×2 IMPLANT
SOLUTION ANTI FOG 6CC (MISCELLANEOUS) ×1 IMPLANT
SPONGE GAUZE 4X4 12PLY (GAUZE/BANDAGES/DRESSINGS) ×5 IMPLANT
SPONGE LAP 18X18 X RAY DECT (DISPOSABLE) ×1 IMPLANT
SPONGE LAP 4X18 X RAY DECT (DISPOSABLE) IMPLANT
SUT BONE WAX W31G (SUTURE) ×2 IMPLANT
SUT ETHIBON 2 0 V 52N 30 (SUTURE) ×3 IMPLANT
SUT ETHIBON EXCEL 2-0 V-5 (SUTURE) IMPLANT
SUT ETHIBOND 2 0 SH (SUTURE)
SUT ETHIBOND 2 0 SH 36X2 (SUTURE) IMPLANT
SUT ETHIBOND 2 0 V4 (SUTURE) IMPLANT
SUT ETHIBOND 2 0V4 GREEN (SUTURE) IMPLANT
SUT ETHIBOND 4 0 RB 1 (SUTURE) IMPLANT
SUT ETHIBOND V-5 VALVE (SUTURE) IMPLANT
SUT ETHIBOND X763 2 0 SH 1 (SUTURE) ×6 IMPLANT
SUT MNCRL AB 3-0 PS2 18 (SUTURE) ×4 IMPLANT
SUT MNCRL AB 4-0 PS2 18 (SUTURE) ×1 IMPLANT
SUT PDS AB 1 CTX 36 (SUTURE) ×4 IMPLANT
SUT PROLENE 2 0 SH DA (SUTURE) IMPLANT
SUT PROLENE 3 0 SH DA (SUTURE) ×3 IMPLANT
SUT PROLENE 3 0 SH1 36 (SUTURE) IMPLANT
SUT PROLENE 4 0 RB 1 (SUTURE) ×8
SUT PROLENE 4 0 SH DA (SUTURE) ×4 IMPLANT
SUT PROLENE 4-0 RB1 .5 CRCL 36 (SUTURE) IMPLANT
SUT PROLENE 5 0 C 1 36 (SUTURE) IMPLANT
SUT PROLENE 6 0 C 1 30 (SUTURE) ×4 IMPLANT
SUT PROLENE 7.0 RB 3 (SUTURE) ×6 IMPLANT
SUT PROLENE 8 0 BV175 6 (SUTURE) IMPLANT
SUT PROLENE BLUE 7 0 (SUTURE) ×3 IMPLANT
SUT PROLENE POLY MONO (SUTURE) IMPLANT
SUT SILK  1 MH (SUTURE) ×1
SUT SILK 1 MH (SUTURE) ×1 IMPLANT
SUT SILK 2 0 SH CR/8 (SUTURE) IMPLANT
SUT SILK 3 0 SH CR/8 (SUTURE) IMPLANT
SUT STEEL 6MS V (SUTURE) ×2 IMPLANT
SUT STEEL STERNAL CCS#1 18IN (SUTURE) IMPLANT
SUT STEEL SZ 6 DBL 3X14 BALL (SUTURE) IMPLANT
SUT VIC AB 1 CTX 36 (SUTURE)
SUT VIC AB 1 CTX36XBRD ANBCTR (SUTURE) IMPLANT
SUT VIC AB 2-0 CT1 27 (SUTURE) ×2
SUT VIC AB 2-0 CT1 TAPERPNT 27 (SUTURE) IMPLANT
SUT VIC AB 2-0 CTX 27 (SUTURE) ×2 IMPLANT
SUT VIC AB 3-0 SH 27 (SUTURE)
SUT VIC AB 3-0 SH 27X BRD (SUTURE) IMPLANT
SUT VIC AB 3-0 X1 27 (SUTURE) IMPLANT
SUT VICRYL 4-0 PS2 18IN ABS (SUTURE) IMPLANT
SUTURE E-PAK OPEN HEART (SUTURE) ×2 IMPLANT
SYR 10ML KIT SKIN ADHESIVE (MISCELLANEOUS) IMPLANT
SYSTEM SAHARA CHEST DRAIN ATS (WOUND CARE) ×2 IMPLANT
TAPE CLOTH SURG 4X10 WHT LF (GAUZE/BANDAGES/DRESSINGS) ×1 IMPLANT
TOWEL OR 17X24 6PK STRL BLUE (TOWEL DISPOSABLE) ×4 IMPLANT
TOWEL OR 17X26 10 PK STRL BLUE (TOWEL DISPOSABLE) ×4 IMPLANT
TRAY FOLEY IC TEMP SENS 14FR (CATHETERS) ×2 IMPLANT
TUBE SUCT INTRACARD DLP 20F (MISCELLANEOUS) ×2 IMPLANT
TUBING INSUFFLATION 10FT LAP (TUBING) ×2 IMPLANT
UNDERPAD 30X30 INCONTINENT (UNDERPADS AND DIAPERS) ×2 IMPLANT
VALVE MAGNA EASE AORTIC 19MM (Prosthesis & Implant Heart) ×1 IMPLANT
VENT LEFT HEART 12002 (CATHETERS) ×2
WATER STERILE IRR 1000ML POUR (IV SOLUTION) ×4 IMPLANT

## 2011-09-06 NOTE — Anesthesia Preprocedure Evaluation (Addendum)
Anesthesia Evaluation  Patient identified by MRN, date of birth, ID band Patient awake    Reviewed: Allergy & Precautions, H&P , NPO status , Patient's Chart, lab work & pertinent test results  Airway Mallampati: II  Neck ROM: full    Dental  (+) Upper Dentures and Lower Dentures   Pulmonary asthma ,          Cardiovascular hypertension, + CAD, + Past MI and +CHF + Valvular Problems/Murmurs AS     Neuro/Psych  Headaches,  Neuromuscular disease    GI/Hepatic GERD-  ,  Endo/Other  Diabetes mellitus-, Type 2Hypothyroidism   Renal/GU Renal InsufficiencyRenal disease     Musculoskeletal   Abdominal   Peds  Hematology   Anesthesia Other Findings   Reproductive/Obstetrics                          Anesthesia Physical Anesthesia Plan  ASA: IV  Anesthesia Plan: General   Post-op Pain Management:    Induction: Intravenous  Airway Management Planned: Oral ETT  Additional Equipment: Arterial line, CVP, PA Cath and TEE  Intra-op Plan:   Post-operative Plan: Post-operative intubation/ventilation  Informed Consent: I have reviewed the patients History and Physical, chart, labs and discussed the procedure including the risks, benefits and alternatives for the proposed anesthesia with the patient or authorized representative who has indicated his/her understanding and acceptance.   Dental advisory given  Plan Discussed with: CRNA, Surgeon and Anesthesiologist  Anesthesia Plan Comments:        Anesthesia Quick Evaluation

## 2011-09-06 NOTE — Transfer of Care (Signed)
Immediate Anesthesia Transfer of Care Note  Patient: NEISA FUGATE  Procedure(s) Performed: Procedure(s) (LRB): AORTIC VALVE REPLACEMENT (AVR) (N/A) CORONARY ARTERY BYPASS GRAFTING (CABG) (N/A)  Patient Location: SICU  Anesthesia Type: General  Level of Consciousness: Patient remains intubated per anesthesia plan  Airway & Oxygen Therapy: Patient remains intubated per anesthesia plan and Patient placed on Ventilator (see vital sign flow sheet for setting)  Post-op Assessment: Post -op Vital signs reviewed and stable  Post vital signs: Reviewed and stable  Complications: No apparent anesthesia complications

## 2011-09-06 NOTE — Progress Notes (Signed)
[facility] BRIEF SICU PROGRESS NOTE  Day of Surgery  S/P Procedure(s) (LRB): AORTIC VALVE REPLACEMENT (AVR) (N/A) CORONARY ARTERY BYPASS GRAFTING (CABG) (N/A)   Sedated on vent AV paced BP stable PA pressures low C.I. 1.1 Chest tube output low UOP 60-75 mL/hr Labs okay  Plan: Needs more volume.  Will also add dopamine  Barbara Garner H 09/06/2011 8:33 PM

## 2011-09-06 NOTE — Progress Notes (Signed)
Pt very difficult to arouse, family advised me that she was a heavy sleeper.  Will continue to stimulate, she remains off sedation and has been off since 1800.

## 2011-09-06 NOTE — Op Note (Signed)
CARDIOTHORACIC SURGERY OPERATIVE NOTE  Date of Procedure:  09/06/2011  Preoperative Diagnosis:   Severe Aortic Stenosis  Severe 3-vessel Coronary Artery Disease  Postoperative Diagnosis: Same  Procedure:   Aortic Valve Replacement  Edwards Magna Ease Pericardial Tissue Valve (size 69mm, model # 3300TFX, serial # [id number])  Coronary Artery Bypass Grafting x 3   Left Internal Mammary Artery to Diagonal Branch Coronary Artery  Saphenous Vein Graft to Posterior Descending Coronary Artery  Saphenous Vein Graft to First Obtuse Marginal Branch of Left Circumflex Coronary Artery  Endoscopic Vein Harvest from Bilateral Thighs  Surgeon: Valentina Gu. Roxy Manns, MD  Assistant: Ellwood Handler, PA-C  Anesthesia: Baruch Merl. Hodierne, MD  Operative Findings:  Severe calcific aortic stenosis   Normal LV systolic function   Moderate LV hypertrophy with diastolic dysfunction   Severe mitral annular calcification with mild mitral stenosis and mild mitral regurgitation   Diffuse coronary artery disease with poor target vessels for grafting   The left anterior descending coronary artery was too diffusely diseased for grafting  Good quality LIMA conduit   Fair to good quality SVG conduit    BRIEF CLINICAL NOTE AND INDICATIONS FOR SURGERY  Patient is a 77 year old widowed white female from [place], [place] with known history of aortic stenosis and coronary artery disease.  The patient underwent angioplasty in 1997 for unstable angina involving high-grade stenosis of a large diagonal branch. She has remained clinically stable from a cardiovascular standpoint since then. She developed progressive aortic stenosis which is now clearly severe and symptomatic. A followup echocardiogram performed in March confirmed the presence of severe aortic stenosis with peak velocity greater than 4 m/s and peak and mean transvalvular gradient estimated 70 and 40 mm mercury respectively. Left ventricular systolic  function is preserved.  Over the past few years the patient has developed progressive exertional shortness of breath and fatigue. Recent months the patient reports that she gets short of breath with very mild physical activity. She gets severely short of breath with moderate activity and this has been associated with mild tightness across her chest. She denies any pain or shortness of breath occurring at rest. She has not had any nocturnal chest pain or shortness of breath. She denies PND, orthopnea, or lower extremity edema. She has not had any dizzy spells or syncope. Cardiac catheterization demonstrates severe three-vessel coronary artery disease. The patient was referred to consider possible aortic valve replacement and coronary artery bypass grafting.  The patient has been seen in consultation and counseled at length regarding the indications, risks and potential benefits of surgery.  All questions have been answered, and the patient provides full informed consent for the operation as described.        DETAILS OF THE OPERATIVE PROCEDURE  The patient is brought to the operating room on the above mentioned date and central monitoring was established by the anesthesia team including placement of Swan-Ganz catheter and radial arterial line. The patient is placed in the supine position on the operating table.  Intravenous antibiotics are administered. General endotracheal anesthesia is induced uneventfully. A Foley catheter is placed.  Baseline transesophageal echocardiogram was performed.  Findings were notable for severe calcific aortic stenosis.  There was normal LV systolic function with moderate LV hypertrophy.  There was severe mitral annular calcification with mild mitral stenosis (mean gradient 2 mmHg) and mild mitral regurgitation.  The patient's chest, abdomen, both groins, and both lower extremities are prepared and draped in a sterile manner. A time out procedure is performed.  A median  sternotomy incision was performed and the left internal mammary artery is dissected from the chest wall and prepared for bypass grafting. The left internal mammary artery is notably good quality conduit. Simultaneously, saphenous vein is obtained from the patient's bilateral thighs using endoscopic vein harvest technique. The saphenous vein is notably fair to good quality conduit. After removal of the saphenous vein, the small surgical incisions in the lower extremity are closed with absorbable suture. Following systemic heparinization, the left internal mammary artery was transected distally noted to have excellent flow.  The pericardium is opened. The ascending aorta is normal in appearance. The ascending aorta and the right atrium are cannulated for cardioplegia bypass.  Adequate heparinization is verified.   A retrograde cardioplegia cannula is placed through the right atrium into the coronary sinus.  The entire pre-bypass portion of the operation was notable for stable hemodynamics.  Cardiopulmonary bypass was begun and a left ventricular vent placed through the right superior pulmonary vein.  The surface of the heart inspected. Distal target vessels are selected for coronary artery bypass grafting. A cardioplegia cannula is placed in the ascending aorta.  A temperature probe was placed in the interventricular septum.  The patient is cooled to 32C systemic temperature.  The aortic cross clamp is applied and cold blood cardioplegia is delivered initially in an antegrade fashion through the aortic root.  Supplemental cardioplegia is given retrograde through the coronary sinus catheter.  Iced saline slush is applied for topical hypothermia.  The initial cardioplegic arrest is rapid with early diastolic arrest.  Repeat doses of cardioplegia are administered intermittently throughout the entire cross clamp portion of the operation through the aortic root, through the coronary sinus catheter, and through  subsequently placed vein grafts in order to maintain completely flat electrocardiogram and septal myocardial temperature below 15C.  Myocardial protection was felt to be excellent.  The following distal coronary artery bypass grafts were performed:   The posterior descending branch of the right coronary artery was grafted using a reversed saphenous vein graft in an end-to-side fashion.  This vessel was small and extremely diseased.  At the site of distal anastomosis the target vessel was very poor quality and measured approximately 1.0 mm in diameter.  The first obtuse marginal branch of the left circumflex coronary artery was grafted using a reversed saphenous vein graft in an end-to-side fashion.  The entire left circumflex system was intramyocardial.  At the site of distal anastomosis the target vessel was fair quality and measured approximately 1.5 mm in diameter.  The distal left circumflex was too small and diffusely diseased for grafting.  The diagonal branch coronary artery was grafted with the left internal mammary artery in an end-to-side fashion.  At the site of distal anastomosis the target vessel was fair quality and measured approximately 1.5 mm in diameter.  The distal left anterior descending coronary artery was too diffusely diseased for grafting.   An oblique transverse aortotomy incision was performed.  The aortic valve was inspected and notable for severe aortic stenosis.  The aortic valve leaflets were excised sharply and the aortic annulus decalcified.  Decalcification was notably challenging.  The aortic annulus was sized to accept a 19 mm prosthesis.  The aortic root and left ventricle were irrigated with copious cold saline solution.  Aortic valve replacement was performed using interrupted horizontal mattress 2-0 Ethibond pledgeted sutures with pledgets in the subannular position.  An [facility] Ease pericardial tissue valve (size 19 mm, model # 3300TFX, serial #  [id number])  was implanted uneventfully. The valve seated appropriately with adequate space beneath the left main and right coronary artery.  The aortotomy was closed using a 2-layer closure of running 4-0 Prolene suture with Teflon felt strips to buttress the suture line.  All proximal vein graft anastomoses were placed directly to the ascending aorta prior to removal of the aortic cross clamp.  The septal myocardial temperature rose rapidly after reperfusion of the left internal mammary artery graft.  One final dose of warm retrograde "hot shot" cardioplegia was administered through the coronary sinus catheter while all air was evacuated through the aortic root.  The aortic cross clamp was removed after a total cross clamp time of 134 minutes.  All proximal and distal coronary anastomoses were inspected for hemostasis and appropriate graft orientation. Epicardial pacing wires are fixed to the right ventricular outflow tract and to the right atrial appendage. The patient is rewarmed to 37C temperature. The aortic and left ventricular vents were removed.  The patient is weaned and disconnected from cardiopulmonary bypass.  The patient's rhythm at separation from bypass was AV paced.  The patient was weaned from cardioplegic bypass without any inotropic support. Total cardiopulmonary bypass time for the operation was 176 minutes.  Followup transesophageal echocardiogram performed after separation from bypass revealed a well seated bioprosthetic tissue valve in the aortic position that was functioning normally.  There was no aortic insufficiency.  Otherwise there were no changes from the preoperative exam.  The aortic and venous cannula were removed uneventfully. Protamine was administered to reverse the anticoagulation. The mediastinum and pleural space were inspected for hemostasis and irrigated with saline solution. The mediastinum and the left pleural space were drained using 3 chest tubes placed through separate  stab incisions inferiorly.  The soft tissues anterior to the aorta were reapproximated loosely. The sternum is closed with double strength sternal wire. The soft tissues anterior to the sternum were closed in multiple layers and the skin is closed with a running subcuticular skin closure.  The post-bypass portion of the operation was notable for stable rhythm and hemodynamics.  The patient tolerated the procedure well and is transported to the surgical intensive care in stable condition. There are no intraoperative complications. All sponge instrument and needle counts are verified correct at completion of the operation.   Valentina Gu. Roxy Manns MD 09/06/2011 4:20 PM

## 2011-09-06 NOTE — Brief Op Note (Signed)
09/06/2011  1:54 PM  PATIENT:  Barbara Garner  77 y.o. female  PRE-OPERATIVE DIAGNOSIS:  AS,CAD  POST-OPERATIVE DIAGNOSIS: Aortic Stenosis, CAD  PROCEDURE:  Procedure(s) (LRB):  AORTIC VALVE REPLACEMENT (AVR)  30mm Edwards Magna Ease Pericardial Tissue Valve  CORONARY ARTERY BYPASS GRAFTINGx3  LIMA to Diagnoal 1  SVG to OM  SVG to PDA  ENDOSCOPIC SAPHENOUS VEIN HARVEST RIGHT AND LEFT THIGH  SURGEON:    Rexene Alberts, MD  ASSISTANTS:  Ellwood Handler, PA-C  ANESTHESIA:   Sydnee Levans, MD  CROSSCLAMP TIME:   5'  CARDIOPULMONARY BYPASS TIME: 176'  FINDINGS:  Severe calcific aortic stenosis  Normal LV systolic function  Moderate LV hypertrophy with diastolic dysfunction  Severe mitral annular calcification with mild mitral stenosis and mild mitral regurgitation  Diffuse coronary artery disease with poor target vessels for grafting  Good quality LIMA conduit  Fair to good quality SVG conduit  COMPLICATIONS: none  PATIENT DISPOSITION:   TO SICU IN STABLE CONDITION  [name] 09/06/2011 4:17 PM

## 2011-09-06 NOTE — Progress Notes (Signed)
*  PRELIMINARY RESULTS* Echocardiogram Echocardiogram Transesophageal has been performed.  Gordon Carlson 09/06/2011, 2:43 PM

## 2011-09-06 NOTE — OR Nursing (Signed)
Chest incision made at 1011

## 2011-09-06 NOTE — Anesthesia Procedure Notes (Addendum)
Performed by: Jon Gills A   Procedure Name: Intubation Date/Time: 09/06/2011 8:51 AM Performed by: Jon Gills A Pre-anesthesia Checklist: Patient identified, Emergency Drugs available, Suction available and Patient being monitored Patient Re-evaluated:Patient Re-evaluated prior to inductionOxygen Delivery Method: Circle system utilized Preoxygenation: Pre-oxygenation with 100% oxygen Intubation Type: IV induction Ventilation: Mask ventilation without difficulty and Oral airway inserted - appropriate to patient size Laryngoscope Size: Sabra Heck and 2 Grade View: Grade I Tube type: Oral Tube size: 7.5 mm Number of attempts: 1 Airway Equipment and Method: Stylet Placement Confirmation: ETT inserted through vocal cords under direct vision,  positive ETCO2 and breath sounds checked- equal and bilateral Secured at: 20 cm Tube secured with: Tape Dental Injury: Teeth and Oropharynx as per pre-operative assessment

## 2011-09-06 NOTE — Interval H&P Note (Signed)
History and Physical Interval Note:  09/06/2011 8:24 AM  Barbara Garner  has presented today for surgery, with the diagnosis of AS,CAD  The various methods of treatment have been discussed with the patient and family. After consideration of risks, benefits and other options for treatment, the patient has consented to  Procedure(s) (LRB): AORTIC VALVE REPLACEMENT (AVR) (N/A) CORONARY ARTERY BYPASS GRAFTING (CABG) (N/A) as a surgical intervention .  The patient's history has been reviewed, patient examined, no change in status, stable for surgery.  I have reviewed the patients' chart and labs.  Questions were answered to the patient's satisfaction.     Dasja Brase H

## 2011-09-06 NOTE — Preoperative (Signed)
Beta Blockers   Reason not to administer Beta Blockers:Not Applicable 

## 2011-09-07 ENCOUNTER — Inpatient Hospital Stay (HOSPITAL_COMMUNITY): Payer: Medicare Other

## 2011-09-07 LAB — CBC
Hemoglobin: 13.6 g/dL (ref 12.0–15.0)
Hemoglobin: 13.7 g/dL (ref 12.0–15.0)
MCH: 30.8 pg (ref 26.0–34.0)
MCH: 30.8 pg (ref 26.0–34.0)
MCHC: 36.4 g/dL — ABNORMAL HIGH (ref 30.0–36.0)
Platelets: 89 10*3/uL — ABNORMAL LOW (ref 150–400)
RBC: 4.45 MIL/uL (ref 3.87–5.11)
RDW: 14.8 % (ref 11.5–15.5)

## 2011-09-07 LAB — PREPARE FRESH FROZEN PLASMA
Unit division: 0
Unit division: 0

## 2011-09-07 LAB — PREPARE PLATELET PHERESIS: Unit division: 0

## 2011-09-07 LAB — CREATININE, SERUM
Creatinine, Ser: 1.27 mg/dL — ABNORMAL HIGH (ref 0.50–1.10)
GFR calc Af Amer: 47 mL/min — ABNORMAL LOW (ref >90)

## 2011-09-07 LAB — POCT I-STAT 3, ART BLOOD GAS (G3+)
Acid-base deficit: 3 mmol/L — ABNORMAL HIGH (ref 0.0–2.0)
Acid-base deficit: 4 mmol/L — ABNORMAL HIGH (ref 0.0–2.0)
Bicarbonate: 20.8 mEq/L (ref 20.0–24.0)
Bicarbonate: 22.6 mEq/L (ref 20.0–24.0)
O2 Saturation: 97 %
O2 Saturation: 99 %
Patient temperature: 37.4
Patient temperature: 36.9
Patient temperature: 36.7
TCO2: 24 mmol/L (ref 0–100)
TCO2: 24 mmol/L (ref 0–100)
pH, Arterial: 7.263 — ABNORMAL LOW (ref 7.350–7.400)

## 2011-09-07 LAB — POCT I-STAT, CHEM 8
HCT: 38 % (ref 36.0–46.0)
Hemoglobin: 12.9 g/dL (ref 12.0–15.0)
Sodium: 144 mEq/L (ref 135–145)
TCO2: 19 mmol/L (ref 0–100)

## 2011-09-07 LAB — GLUCOSE, CAPILLARY
Glucose-Capillary: 103 mg/dL — ABNORMAL HIGH (ref 70–99)
Glucose-Capillary: 158 mg/dL — ABNORMAL HIGH (ref 70–99)
Glucose-Capillary: 160 mg/dL — ABNORMAL HIGH (ref 70–99)

## 2011-09-07 LAB — BASIC METABOLIC PANEL
Calcium: 7 mg/dL — ABNORMAL LOW (ref 8.4–10.5)
GFR calc Af Amer: 59 mL/min — ABNORMAL LOW (ref >90)
GFR calc non Af Amer: 51 mL/min — ABNORMAL LOW (ref >90)
Glucose, Bld: 180 mg/dL — ABNORMAL HIGH (ref 70–99)
Potassium: 4 mEq/L (ref 3.5–5.1)
Sodium: 136 mEq/L (ref 135–145)

## 2011-09-07 MED ORDER — FUROSEMIDE 10 MG/ML IJ SOLN
20.0000 mg | Freq: Four times a day (QID) | INTRAMUSCULAR | Status: AC
  Administered 2011-09-07 – 2011-09-08 (×3): 20 mg via INTRAVENOUS
  Filled 2011-09-07 (×3): qty 2

## 2011-09-07 MED ORDER — MORPHINE SULFATE 2 MG/ML IJ SOLN
1.0000 mg | INTRAMUSCULAR | Status: DC | PRN
  Administered 2011-09-07 (×2): 1 mg via INTRAVENOUS
  Filled 2011-09-07 (×3): qty 1

## 2011-09-07 MED ORDER — LABETALOL HCL 5 MG/ML IV SOLN: 5.0000 mg | INTRAVENOUS | Status: DC | PRN

## 2011-09-07 MED ORDER — SODIUM BICARBONATE 8.4 % IV SOLN
50.0000 meq | Freq: Once | INTRAVENOUS | Status: AC
  Administered 2011-09-07: 50 meq via INTRAVENOUS
  Filled 2011-09-07: qty 50

## 2011-09-07 MED ORDER — AMLODIPINE BESYLATE 10 MG PO TABS
10.0000 mg | ORAL_TABLET | Freq: Every day | ORAL | Status: DC
  Administered 2011-09-08 – 2011-09-20 (×13): 10 mg via ORAL
  Filled 2011-09-07 (×13): qty 1

## 2011-09-07 MED ORDER — INSULIN ASPART 100 UNIT/ML ~~LOC~~ SOLN
0.0000 [IU] | SUBCUTANEOUS | Status: DC
  Administered 2011-09-07 – 2011-09-08 (×3): 4 [IU] via SUBCUTANEOUS
  Administered 2011-09-08 – 2011-09-09 (×5): 2 [IU] via SUBCUTANEOUS
  Administered 2011-09-09: 4 [IU] via SUBCUTANEOUS
  Administered 2011-09-10: 8 [IU] via SUBCUTANEOUS
  Administered 2011-09-10 (×2): 4 [IU] via SUBCUTANEOUS
  Administered 2011-09-11: 8 [IU] via SUBCUTANEOUS
  Administered 2011-09-11 – 2011-09-12 (×2): 2 [IU] via SUBCUTANEOUS

## 2011-09-07 MED ORDER — ALBUMIN HUMAN 5 % IV SOLN
12.5000 g | Freq: Once | INTRAVENOUS | Status: AC
  Administered 2011-09-07: 12.5 g via INTRAVENOUS
  Filled 2011-09-07: qty 250

## 2011-09-07 MED ORDER — AMLODIPINE BESYLATE 5 MG PO TABS
5.0000 mg | ORAL_TABLET | Freq: Once | ORAL | Status: AC
  Administered 2011-09-07: 5 mg via ORAL
  Filled 2011-09-07: qty 1

## 2011-09-07 MED FILL — Potassium Chloride Inj 2 mEq/ML: INTRAVENOUS | Qty: 40 | Status: AC

## 2011-09-07 MED FILL — Magnesium Sulfate Inj 50%: INTRAMUSCULAR | Qty: 10 | Status: AC

## 2011-09-07 NOTE — Progress Notes (Addendum)
   CARDIOTHORACIC SURGERY PROGRESS NOTE   R1 Day Post-Op Procedure(s) (LRB): AORTIC VALVE REPLACEMENT (AVR) (N/A) CORONARY ARTERY BYPASS GRAFTING (CABG) (N/A)  Subjective: Still sleepy but arousable on vent.  Follows commands.  Failed attempt at extubation earlier due to somnolence.  Objective: Vital signs: BP Readings from Last 1 Encounters:  09/07/11 108/64   Pulse Readings from Last 1 Encounters:  09/07/11 79   Resp Readings from Last 1 Encounters:  09/07/11 17   Temp Readings from Last 1 Encounters:  09/07/11 99 F (37.2 C) Core (Comment)    Hemodynamics: PAP: (21-37)/(15-28) 28/22 mmHg CO:  [1.5 L/min-2.2 L/min] 2.2 L/min CI:  [1.1 L/min/m2-1.6 L/min/m2] 1.6 L/min/m2  Physical Exam:  Rhythm:   Junctional, AV paced  Breath sounds: clear  Heart sounds:  RRR  Incisions:  Dressings dry  Abdomen:  soft  Extremities:  warm   Intake/Output from previous day: 06/19 0701 - 06/20 0700 In: 8089.9 [I.V.:4012.9; AV:4273791; NG/GT:30; IV Piggyback:1510] Out: [id number] [Urine:2205; Blood:1975; Chest Tube:560] Intake/Output this shift:    Lab Results:  Basename 09/07/11 0400 09/06/11 2213 09/06/11 2210  WBC 17.3* -- 14.7*  HGB 13.6 13.9 --  HCT 37.4 41.0 --  PLT 89* -- 95*   BMET:  Basename 09/07/11 0400 09/06/11 2213 09/04/11 1337  NA 136 141 --  K 4.0 4.1 --  CL 104 105 --  CO2 18* -- 16*  GLUCOSE 180* 162* --  BUN 22 20 --  CREATININE 1.04 0.90 --  CALCIUM 7.0* -- 9.0    CBG (last 3)   Basename 09/07/11 0035 09/06/11 1843 09/06/11 1738  GLUCAP 158* 111* 104*   ABG    Component Value Date/Time   PHART 7.263* 09/07/2011 0534   HCO3 20.8 09/07/2011 0534   TCO2 22 09/07/2011 0534   ACIDBASEDEF 6.0* 09/07/2011 0534   O2SAT 95.0 09/07/2011 0534   CXR: Bibasilar atelectasis, poor inspiration  Assessment/Plan: S/P Procedure(s) (LRB): AORTIC VALVE REPLACEMENT (AVR) (N/A) CORONARY ARTERY BYPASS GRAFTING (CABG) (N/A)  Overall stable POD1 Slow to wake up  post op, still somewhat sleepy on vent but neuro grossly intact PA pressures low, probably still somewhat intravascularly dry Normal LV systolic function but moderate-severe diastolic dysfunction and mild mitral stenosis Very frail preop   Wean vent and extubate once more awake  Mobilize once extubated  Hold diuretics for now  Continue low dose dopamine  One amp bicarb and push a little volume (albumin)  Alexcis Bicking H 09/07/2011 7:53 AM

## 2011-09-07 NOTE — Progress Notes (Signed)
INITIAL ADULT NUTRITION ASSESSMENT Date: 09/07/2011   Time: 12:26 PM  Reason for Assessment: Low Braden  ASSESSMENT: Female 77 y.o.  Dx: CAD, aortic stenosis  Hx:  Past Medical History  Diagnosis Date  . Iron deficiency anemia, unspecified   . Chronic diastolic CHF (congestive heart failure)   . CAD (coronary artery disease)     a.  Unstable angina in 1997 with PTCA of large D1;  b. Myoview (9/10) at [facility] showed EF 84%, stable perfusion images with no ischemia.;  c. Cath 07/2011 - Severe Diffuse 3VD  . Type II or unspecified type diabetes mellitus without mention of complication, not stated as uncontrolled   . Contact dermatitis and other eczema, due to unspecified cause   . Esophageal reflux   . Headache   . Personal history of unspecified circulatory disease   . Pure hypercholesterolemia   . Unspecified essential hypertension   . Unspecified hypothyroidism   . Other specified erythematous condition   . Memory loss   . Unspecified hereditary and idiopathic peripheral neuropathy   . LBBB (left bundle branch block)   . Carotid stenosis     Carotid dopplers [id number] with [id number] LICA stenosis  . Severe aortic stenosis     a. 05/2011 Echo:  EF 55-65%, Gr1DD, Sev AS, Mild AI, PASP 29mmHg.  Marland Kitchen Extrinsic asthma, unspecified     hasn't used inhaler in past year  . CKD (chronic kidney disease) stage 4, GFR 15-29 ml/min     Cr 1.6 in 6/11, sees Dr. Arty Baumgartner  . Impaired vision     pt. reports that she identifies her meds by looking at the pillls, she is not able to read labels on bottles  . S/P aortic valve replacement 09/06/2011    19 mm [facility] Ease pericardial tissue valve  . S/P CABG x 3 09/06/2011    LIMA to diagonal branch, SVG to OM1, SVG to PDA, EVH via bilateral thighs    Related Meds:     . acetaminophen (TYLENOL) oral liquid 160 mg/5 mL  650 mg Per Tube NOW   Or  . acetaminophen  650 mg Rectal NOW  . acetaminophen  1,000 mg Oral Q6H   Or  . acetaminophen  (TYLENOL) oral liquid 160 mg/5 mL  975 mg Per Tube Q6H  . albumin human  12.5 g Intravenous Once  . aspirin EC  325 mg Oral Daily   Or  . aspirin  324 mg Per Tube Daily  . bisacodyl  10 mg Oral Daily   Or  . bisacodyl  10 mg Rectal Daily  . cefUROXime (ZINACEF)  IV  1.5 g Intravenous To OR  . cefUROXime (ZINACEF)  IV  1.5 g Intravenous Q12H  . docusate sodium  200 mg Oral Daily  . famotidine (PEPCID) IV  20 mg Intravenous Q12H  . insulin aspart  0-24 Units Subcutaneous Q2H   Followed by  . insulin aspart  0-24 Units Subcutaneous Q4H  . insulin (NOVOLIN-R) infusion   Intravenous To OR  . levothyroxine  50 mcg Oral QAC breakfast  . magnesium sulfate  4 g Intravenous Once  . metoprolol      . metoprolol tartrate  12.5 mg Oral BID   Or  . metoprolol tartrate  12.5 mg Per Tube BID  . mupirocin ointment   Nasal BID  . pantoprazole  40 mg Oral Q1200  . potassium chloride  10 mEq Intravenous Q1 Hr x 3  . sodium bicarbonate  50  mEq Intravenous Once  . sodium chloride  3 mL Intravenous Q12H  . vancomycin  1,000 mg Intravenous Once  . DISCONTD: cefUROXime (ZINACEF)  IV  750 mg Intravenous To OR  . DISCONTD: DOPamine  2-20 mcg/kg/min Intravenous To OR  . DISCONTD: epinephrine  0.5-20 mcg/min Intravenous To OR  . DISCONTD: insulin regular  0-10 Units Intravenous TID WC  . DISCONTD: magnesium sulfate  40 mEq Other To OR  . DISCONTD: metoprolol tartrate  12.5 mg Oral Once  . DISCONTD: potassium chloride  80 mEq Other To OR  . DISCONTD: tranexamic acid  15 mg/kg Intravenous To OR  . DISCONTD: tranexamic acid  2 mg/kg Intracatheter To OR    Ht: 5' 1 (154.9 cm)  Wt: 105 lb (47.6 kg)   Ideal Wt: 51 kg % Ideal Wt: 93%  Usual Wt: 103 lb -- per office visit record 08/28/11 % Usual Wt: 101%  BMI = 20.0 kg/m2  Food/Nutrition Related Hx: admission nutrition screen incomplete  Labs:  CMP     Component Value Date/Time   NA 136 09/07/2011 0400   K 4.0 09/07/2011 0400   CL 104 09/07/2011  0400   CO2 18* 09/07/2011 0400   GLUCOSE 180* 09/07/2011 0400   BUN 22 09/07/2011 0400   CREATININE 1.04 09/07/2011 0400   CALCIUM 7.0* 09/07/2011 0400   PROT 7.4 09/04/2011 1337   ALBUMIN 3.5 09/04/2011 1337   AST 20 09/04/2011 1337   ALT 11 09/04/2011 1337   ALKPHOS 107 09/04/2011 1337   BILITOT 0.3 09/04/2011 1337   GFRNONAA 51* 09/07/2011 0400   GFRAA 59* 09/07/2011 0400     Intake/Output Summary (Last 24 hours) at 09/07/11 1231 Last data filed at 09/07/11 1100  Gross per 24 hour  Intake 8310.67 ml  Output   4640 ml  Net 3670.67 ml    CBG (last 3)   Basename 09/07/11 1159 09/07/11 0751 09/07/11 0351  GLUCAP 151* 160* 162*    Diet Order: NPO  Supplements/Tube Feeding: N/A  IVF:    sodium chloride Last Rate: 20 mL/hr at 09/06/11 1647  sodium chloride   sodium chloride   dexmedetomidine (PRECEDEX) IV infusion Last Rate: Stopped (09/06/11 1800)  DOPamine Last Rate: 3 mcg/kg/min (09/07/11 1100)  lactated ringers Last Rate: 20 mL/hr at 09/06/11 1900  nitroGLYCERIN Last Rate: Stopped (09/06/11 1630)  DISCONTD: insulin (NOVOLIN-R) infusion Last Rate: 1.1 Units/hr (09/06/11 1620)  DISCONTD: phenylephrine (NEO-SYNEPHRINE) Adult infusion Last Rate: Stopped (09/06/11 2300)    Estimated Nutritional Needs:   Kcal: 1200-1400 Protein: 65-75 gm Fluid: > 1.5 L  RD unable to obtain nutrition hx at this time; patient s/p CABG and aortic valve replacement 6/19; extubated this AM; patient with very low braden score of 9; at risk for skin breakdown; patient very frail in appearance; if able to take PO's suspect intake to be suboptimal post-op; will likely benefit from addition of supplementation when/as able  NUTRITION DIAGNOSIS: -Inadequate oral intake (NI-2.1).  Status: Ongoing  RELATED TO: inability to eat, s/p extubation  AS EVIDENCE BY: NPO status  MONITORING/EVALUATION(Goals): Goal: Oral intake vs nutrition support to meet >90% of estimated nutrition needs Monitor: PO diet  advancement, nutrition support initiation, weight, labs, I/O's  EDUCATION NEEDS: -No education needs identified at this time  INTERVENTION:  Advance diet as medically appropriate  RD to follow for nutrition care plan, add interventions/make recommendations accordingly  Dietitian #: [place] Per approved criteria  -Not Applicable    Barbara Garner 09/07/2011,  12:26 PM

## 2011-09-07 NOTE — Evaluation (Signed)
Physical Therapy Evaluation Patient Details Name: Barbara Garner MRN: [id number] DOB: [birth year] Today's Date: 09/07/2011 Time: [id number] PT Time Calculation (min): 20 min  PT Assessment / Plan / Recommendation Clinical Impression  Pt s/p AVR, CABG who was extubated 5 hrs ago with Annette Stable still present limiting mobility. Pt mobilizing well considering status and lethargic presumed due to meds but able to answer questions and respond to commands appropriately. Nursing present throughout eval to assist with mobility. Pt will benefit from acute therapy to maximize mobility, gait, safety awareness prior to discharge to decrease burden of care.     PT Assessment  Patient needs continued PT services    Follow Up Recommendations  Home health PT;Supervision for mobility/OOB;Skilled nursing facility (pending pt progression and family assist)    Barriers to Discharge Decreased caregiver support Son present at home but may not have mental capacity to provide care for pt pending pt mobility progression.     lEquipment Recommendations  Rolling walker with 5" wheels    Recommendations for Other Services OT consult   Frequency Min 3X/week    Precautions / Restrictions Precautions Precautions: Sternal;Fall Precaution Comments: multiple lines and oxygen   Pertinent Vitals/Pain HR 79, sats 95% on 4L, BP 135/78      Mobility  Bed Mobility Bed Mobility: Rolling Right;Supine to Sit;Sit to Sidelying Right Rolling Right: 4: Min assist Supine to Sit: 3: Mod assist;HOB elevated (HOB 40degrees) Sit to Sidelying Right: 2: Max assist;HOB flat Details for Bed Mobility Assistance: Assist and use of pad to pivot legs and sacrum to EOB with support at trunk to prevent posterior LOB. Pt able to roll trunk to right maintaining precautions with min assist at pelvis. Increased time for transfers. Cueing for sequence throughout and assist to elevate legs onto bed Transfers Transfers: Sit to Stand;Stand to Sit Sit  to Stand: 1: +2 Total assist;From bed Sit to Stand: Patient Percentage: 60% Stand to Sit: 4: Min assist;To bed Details for Transfer Assistance: Pt requested to stand and stood x2 from bed with surface in lowest position for pt height. Pt side stepped toward [facility] on second trial 3 steps with cueing for hand placement and sequence with assist for anterior translation Ambulation/Gait Ambulation/Gait Assistance: Not tested (comment) Stairs: No    Exercises     PT Diagnosis: Acute pain;Abnormality of gait  PT Problem List: Decreased activity tolerance;Decreased mobility;Decreased knowledge of precautions;Pain;Decreased knowledge of use of DME;Cardiopulmonary status limiting activity PT Treatment Interventions: Gait training;DME instruction;Functional mobility training;Stair training;Therapeutic activities;Therapeutic exercise;Patient/family education   PT Goals Acute Rehab PT Goals PT Goal Formulation: With patient Time For Goal Achievement: 09/21/11 Pt will go Supine/Side to Sit: with supervision PT Goal: Supine/Side to Sit - Progress: Goal set today Pt will go Sit to Supine/Side: with supervision PT Goal: Sit to Supine/Side - Progress: Goal set today Pt will go Sit to Stand: with supervision PT Goal: Sit to Stand - Progress: Goal set today Pt will go Stand to Sit: with supervision PT Goal: Stand to Sit - Progress: Goal set today Pt will Ambulate: >150 feet;with supervision;with least restrictive assistive device PT Goal: Ambulate - Progress: Goal set today Pt will Go Up / Down Stairs: 3-5 stairs;with min assist;with least restrictive assistive device PT Goal: Up/Down Stairs - Progress: Goal set today Additional Goals Additional Goal #1: Pt will independently state and demonstrate adherence to all sternal precautions PT Goal: Additional Goal #1 - Progress: Goal set today  Visit Information  Last PT Received On: 09/07/11 Assistance Needed: +  2    Subjective Data  Subjective: "wouldn't  it be easier just to stand up" " I didn't even see the man who did the surgery. I didn't know I had surgery." Patient Stated Goal: breathe   Prior Functioning  Home Living Lives With: Son Available Help at Discharge: Family;Available 24 hours/day Type of Home: House Home Access: Stairs to enter [employer] of Steps: 5 Entrance Stairs-Rails: Can reach both;Left;Right Home Layout: One level Bathroom Shower/Tub: Chiropodist: Standard Home Adaptive Equipment: None Prior Function Level of Independence: Independent Able to Take Stairs?: Yes Driving: No Vocation: Retired Comments: sons do the housework Communication Communication: No difficulties    Cognition  Overall Cognitive Status: Impaired Area of Impairment: Memory Arousal/Alertness: Lethargic Orientation Level: Disoriented X4;Place;Situation;Time Behavior During Session: Lethargic Memory: Decreased recall of precautions Memory Deficits: Pt unable to recall precautions after education    Extremity/Trunk Assessment Right Lower Extremity Assessment RLE ROM/Strength/Tone: [facility] for tasks assessed Left Lower Extremity Assessment LLE ROM/Strength/Tone: WFL for tasks assessed   Balance Static Sitting Balance Static Sitting - Balance Support: No upper extremity supported;Feet supported Static Sitting - Level of Assistance: 5: Stand by assistance Static Sitting - Comment/# of Minutes: Pt initially min assist for balance due to posterior lean but once fully EOB with cueing for posture pt able to maintain without physical assist grossly 4 min  End of Session PT - End of Session Activity Tolerance: Patient tolerated treatment well Patient left: in bed;with call bell/phone within reach Nurse Communication: Mobility status   Melford Aase 09/07/2011, 4:56 PM  Elwyn Reach, [place]

## 2011-09-07 NOTE — Progress Notes (Signed)
Nitro titrated to off, pt bp stable and no longer hypertensive.  Will continue to closely monitor.

## 2011-09-07 NOTE — Progress Notes (Signed)
POD # 1 AVR/ CABG\  Extubated around noon  BP 127/72  Pulse 79  Temp 98.1 F (36.7 C) (Core (Comment))  Resp 25  Wt 105 lb (47.628 kg)  SpO2 96%   Intake/Output Summary (Last 24 hours) at 09/07/11 1740 Last data filed at 09/07/11 1700  Gross per 24 hour  Intake 2198.77 ml  Output   2220 ml  Net -21.23 ml    Continue present care

## 2011-09-07 NOTE — Procedures (Signed)
Extubation Procedure Note  Patient Details:   Name: Barbara Garner DOB: 17-Mar-1935 MRN: [id number]   Airway Documentation:   Pt extubated to 4L [place]. No stridor noted. BBS dim. Pt awake but asleepy. HR 80 SAT 98%, RR 21.   Evaluation  O2 sats: stable throughout and currently acceptable Complications: No apparent complications Patient did tolerate procedure well. Bilateral Breath Sounds: Clear Suctioning: Airway   Lissa Merlin 09/07/2011, 10:51 AM

## 2011-09-07 NOTE — Care Management Note (Unsigned)
    Page 1 of 2   09/15/2011     11:46:53 AM   CARE MANAGEMENT NOTE 09/15/2011  Patient:  Barbara Garner, Barbara Garner   Account Number:  [id number]  Date Initiated:  09/07/2011  Documentation initiated by:  AMERSON,JULIE  Subjective/Objective Assessment:   PT S/P AVR AND CABG X 3.  PTA, PT LIVES WITH 2 SONS, ONE WHO WORKS.     Action/Plan:   MET WITH PT'S SON TO DISCUSS DC PLANS.  SON STATES HE IS THERE 24H/DAY, BUT THAT HE IS AGREEABLE TO ST-SNF FOR REHAB, SHOULD PT NEED IT.  WILL OBTAIN PT AND OT CONSULT FOR RECOMMENDATIONS.   Anticipated DC Date:  09/16/2011   Anticipated DC Plan:  SKILLED NURSING FACILITY  In-house referral  Clinical Social Worker      DC Planning Services  CM consult      PAC Choice  IP REHAB   Choice offered to / List presented to:             Status of service:  In process, will continue to follow Medicare Important Message given?   (If response is "NO", the following Medicare IM given date fields will be blank) Date Medicare IM given:   Date Additional Medicare IM given:    Discharge Disposition:    Per UR Regulation:  Reviewed for med. necessity/level of care/duration of stay  If discussed at [place] of Stay Meetings, dates discussed:    Comments:  09/15/11  [place], BSN [phone] D/C POSTPONED DUE TO POSSIBLE SEPSIS; WILL D/C TO SNF WHEN STABLE.  09/12/11  1038  [place], BSN [phone] D/C PLAN CURRENTLY  HH  VS  SNF.   NCM WILL FOLLOW.   09/07/11 JULIE AMERSON,RN,BSN 1200 WILL CONSULT CSW TO FOLLOW TO FACILITATE POSSIBLE DISCHARGE TO SNF WHEN MEDICALLY STABLE.  WILL FOLLOW.

## 2011-09-07 NOTE — Progress Notes (Signed)
Pt opening eyes on command now, able to follow commands, respiratory therapy called for initiation of weaning.

## 2011-09-07 NOTE — Progress Notes (Addendum)
Discussed low cardiac index and output with MD orders received for Dopamine.

## 2011-09-07 NOTE — Progress Notes (Signed)
Pt drowsiness continues, follows commands but is still unable to stay awake long enough to breath over the ventilator.  Will continue to monitor closely.

## 2011-09-07 NOTE — Anesthesia Postprocedure Evaluation (Signed)
  Anesthesia Post-op Note  Patient: Barbara Garner  Procedure(s) Performed: Procedure(s) (LRB): AORTIC VALVE REPLACEMENT (AVR) (N/A) CORONARY ARTERY BYPASS GRAFTING (CABG) (N/A)  Patient Location: ICU  Anesthesia Type: General  Level of Consciousness: sedated  Airway and Oxygen Therapy: Patient remains intubated per anesthesia plan  Post-op Pain: none  Post-op Assessment: Post-op Vital signs reviewed, Patient's Cardiovascular Status Stable and Respiratory Function Stable  Post-op Vital Signs: Reviewed and stable  Complications: No apparent anesthesia complications

## 2011-09-07 NOTE — Progress Notes (Signed)
Wean mechanics failed by patient.  VC 300, NIF -15, unable to hold head off bed.  ABG results reviewed with RN, acidotic with hypercapnia.  Placed back on full support to allow to wake up more fully.

## 2011-09-08 ENCOUNTER — Encounter (HOSPITAL_COMMUNITY): Payer: Self-pay | Admitting: Thoracic Surgery (Cardiothoracic Vascular Surgery)

## 2011-09-08 ENCOUNTER — Inpatient Hospital Stay (HOSPITAL_COMMUNITY): Payer: Medicare Other

## 2011-09-08 LAB — BASIC METABOLIC PANEL
BUN: 25 mg/dL — ABNORMAL HIGH (ref 6–23)
Calcium: 8 mg/dL — ABNORMAL LOW (ref 8.4–10.5)
GFR calc non Af Amer: 36 mL/min — ABNORMAL LOW (ref >90)
Glucose, Bld: 153 mg/dL — ABNORMAL HIGH (ref 70–99)
Sodium: 140 mEq/L (ref 135–145)

## 2011-09-08 LAB — TYPE AND SCREEN
Antibody Screen: NEGATIVE
Unit division: 0
Unit division: 0
Unit division: 0

## 2011-09-08 LAB — GLUCOSE, CAPILLARY
Glucose-Capillary: 147 mg/dL — ABNORMAL HIGH (ref 70–99)
Glucose-Capillary: 119 mg/dL — ABNORMAL HIGH (ref 70–99)
Glucose-Capillary: 164 mg/dL — ABNORMAL HIGH (ref 70–99)
Glucose-Capillary: 121 mg/dL — ABNORMAL HIGH (ref 70–99)
Glucose-Capillary: 126 mg/dL — ABNORMAL HIGH (ref 70–99)

## 2011-09-08 LAB — CBC
MCH: 30.8 pg (ref 26.0–34.0)
MCHC: 35.6 g/dL (ref 30.0–36.0)
Platelets: 74 10*3/uL — ABNORMAL LOW (ref 150–400)
RBC: 4.42 MIL/uL (ref 3.87–5.11)

## 2011-09-08 MED ORDER — GLUCERNA SHAKE PO LIQD
237.0000 mL | Freq: Three times a day (TID) | ORAL | Status: DC
  Administered 2011-09-08 – 2011-09-20 (×19): 237 mL via ORAL
  Filled 2011-09-08 (×2): qty 237

## 2011-09-08 MED ORDER — CHLORHEXIDINE GLUCONATE CLOTH 2 % EX PADS
6.0000 | MEDICATED_PAD | Freq: Every day | CUTANEOUS | Status: AC
  Administered 2011-09-10 – 2011-09-13 (×4): 6 via TOPICAL

## 2011-09-08 MED FILL — Sodium Chloride Irrigation Soln 0.9%: Qty: 3000 | Status: AC

## 2011-09-08 MED FILL — Electrolyte-R (PH 7.4) Solution: INTRAVENOUS | Qty: 8000 | Status: AC

## 2011-09-08 MED FILL — Lidocaine HCl IV Inj 20 MG/ML: INTRAVENOUS | Qty: 5 | Status: AC

## 2011-09-08 MED FILL — Sodium Bicarbonate IV Soln 8.4%: INTRAVENOUS | Qty: 50 | Status: AC

## 2011-09-08 MED FILL — Mannitol IV Soln 20%: INTRAVENOUS | Qty: 500 | Status: AC

## 2011-09-08 MED FILL — Heparin Sodium (Porcine) Inj 1000 Unit/ML: INTRAMUSCULAR | Qty: 30 | Status: AC

## 2011-09-08 MED FILL — Sodium Chloride IV Soln 0.9%: INTRAVENOUS | Qty: 1000 | Status: AC

## 2011-09-08 MED FILL — Heparin Sodium (Porcine) Inj 1000 Unit/ML: INTRAMUSCULAR | Qty: 10 | Status: AC

## 2011-09-08 MED FILL — Albumin, Human Inj 5%: INTRAVENOUS | Qty: 250 | Status: AC

## 2011-09-08 NOTE — Evaluation (Signed)
Occupational Therapy Evaluation Patient Details Name: Barbara Garner MRN: [id number] DOB: [birth year] Today's Date: 09/08/2011 Time: [id number] OT Time Calculation (min): 24 min  OT Assessment / Plan / Recommendation Clinical Impression  Pt is a 77 yo female who presents with AVR, CABG who is limited by lethargy. Skilled OT indicated to maximize I w/BADLs to min-mod A level in prep for d/c to next venue of care.    OT Assessment  Patient needs continued OT Services    Follow Up Recommendations  Skilled nursing facility;Supervision/Assistance - 24 hour    Barriers to Discharge Inaccessible home environment;Decreased caregiver support    Equipment Recommendations  Rolling walker with 5" wheels;3 in 1 bedside comode    Recommendations for Other Services    Frequency  Min 2X/week    Precautions / Restrictions Precautions Precautions: Sternal;Fall Precaution Comments: chest tube, oxygen   Pertinent Vitals/Pain Pt stated she "hurt all over."    ADL  Grooming: Simulated;+1 Total assistance Where Assessed - Grooming: Supported sitting Upper Body Bathing: Simulated;+1 Total assistance Where Assessed - Upper Body Bathing: Supported sitting Lower Body Bathing: Simulated;+2 Total assistance Lower Body Bathing: Patient Percentage: 0% Where Assessed - Lower Body Bathing: Supported sit to stand Upper Body Dressing: Simulated;+1 Total assistance Where Assessed - Upper Body Dressing: Supported sitting Lower Body Dressing: Simulated;+2 Total assistance Lower Body Dressing: Patient Percentage: 0% Where Assessed - Lower Body Dressing: Sopported sit to stand Toilet Transfer: Simulated;+2 Total assistance Toilet Transfer: Patient Percentage: 50% Toilet Transfer Method: Sit to stand Toileting - Water quality scientist and Hygiene: Simulated;+2 Total assistance Toileting - Water quality scientist and Hygiene: Patient Percentage: 0% Where Assessed - Camera operator Manipulation and Hygiene:  Standing Equipment Used: Rolling walker;Gait belt Transfers/Ambulation Related to ADLs: Pt did ambulate ~100 feet but with eyes closed. Constant multimodal cues for safe manipulation of RW and to keep eyes open. ADL Comments: Feel pt could have performed better if not so lethargic.     OT Diagnosis: Generalized weakness  OT Problem List: Decreased cognition;Decreased strength;Decreased activity tolerance;Decreased safety awareness;Decreased knowledge of use of DME or AE;Impaired balance (sitting and/or standing);Decreased knowledge of precautions;Pain;Impaired vision/perception OT Treatment Interventions: Self-care/ADL training;Therapeutic activities;DME and/or AE instruction;Patient/family education   OT Goals Acute Rehab OT Goals OT Goal Formulation: Patient unable to participate in goal setting Time For Goal Achievement: 09/22/11 Potential to Achieve Goals: Good ADL Goals Pt Will Perform Eating: with set-up;Sitting, chair;Supported ADL Goal: Eating - Progress: Goal set today Pt Will Perform Grooming: with set-up;with supervision;Sitting, edge of bed;Sitting, chair;Unsupported ADL Goal: Grooming - Progress: Goal set today Pt Will Transfer to Toilet: with min assist;Ambulation;Regular height toilet;3-in-1 ADL Goal: Toilet Transfer - Progress: Goal set today Pt Will Perform Toileting - Clothing Manipulation: with mod assist;Standing ADL Goal: Toileting - Clothing Manipulation - Progress: Goal set today Pt Will Perform Toileting - Hygiene: with mod assist;Sit to stand from 3-in-1/toilet ADL Goal: Toileting - Hygiene - Progress: Goal set today Miscellaneous OT Goals Miscellaneous OT Goal #1: Pt will complete supine>sit with min A in prep for seated ADL Miscellaneous OT Goal #2: Pt will recall 3/5 sternal precautions and incorporate them into ADL activity with mod VCs. OT Goal: Miscellaneous Goal #2 - Progress: Goal set today  Visit Information  Last OT Received On: 09/08/11 Assistance  Needed: +3 or more (+2 for pt handling, 3rd for chair)    Subjective Data  Subjective: [place]. I hurt all over. Patient Stated Goal: Pt unable to state at this time 2* lethargy   Prior  Functioning  Home Living Lives With: Son Available Help at Discharge: Family;Available 24 hours/day Type of Home: House Home Access: Stairs to enter [employer] of Steps: 5 Entrance Stairs-Rails: Left;Right;Can reach both Home Layout: One level Bathroom Shower/Tub: Chiropodist: Standard Home Adaptive Equipment: None Prior Function Level of Independence: Independent Able to Take Stairs?: Yes Driving: No Vocation: Retired Corporate investment banker: No difficulties    Cognition  Overall Cognitive Status: Impaired Area of Impairment: Attention;Memory Arousal/Alertness: Lethargic Orientation Level: Disoriented to;Place;Time;Situation Behavior During Session: Lethargic Current Attention Level: Focused Memory: Decreased recall of precautions Cognition - Other Comments: Pt continues to maintain eyes closed in sitting and only partially open in standing without cueing. However, pt reports she is losing her vision and can't see much anyway    Extremity/Trunk Assessment Right Upper Extremity Assessment RUE ROM/Strength/Tone: Unable to fully assess (2* lethargy) Left Upper Extremity Assessment LUE ROM/Strength/Tone: Unable to fully assess (2* lethargy)   Mobility Bed Mobility Bed Mobility: Sit to Supine;Scooting to HOB Sit to Supine: 1: +2 Total assist;HOB flat Sit to Supine: Patient Percentage: 30% Scooting to HOB: 1: +2 Total assist Scooting to [facility]: Patient Percentage: 0% Details for Bed Mobility Assistance: cueing for sequence and assist to control descent to side and assist to elevate LLE onto surface Transfers Sit to Stand: 1: +2 Total assist;From chair/3-in-1 Sit to Stand: Patient Percentage: 60% Stand to Sit: 4: Min assist;To bed Details for Transfer  Assistance: cueing for hand placement, sequence and anterior translation. Pt with increased assist to lean forward to initiate standing but good activation of quads to push into standing   Exercise    Balance Static Sitting Balance Static Sitting - Balance Support: No upper extremity supported;Feet supported Static Sitting - Level of Assistance: 4: Min assist Static Sitting - Comment/# of Minutes: Pt continues with tendency toward posterior lean EOB without tactile cueing and assist to maintain upright position presumed secondary to lethargy  End of Session OT - End of Session Equipment Utilized During Treatment: Gait belt Activity Tolerance: Patient limited by fatigue Patient left: in bed;with call bell/phone within reach   [place] A OTR/L [phone] 09/08/2011, 8:33 AM

## 2011-09-08 NOTE — Progress Notes (Addendum)
2 Days Post-Op Procedure(s) (LRB): AORTIC VALVE REPLACEMENT (AVR) (N/A) CORONARY ARTERY BYPASS GRAFTING (CABG) (N/A)  Subjective: Patient is oriented but she is somewhat lethargic.  Objective: Vital signs in last 24 hours: Patient Vitals for the past 24 hrs:  BP Temp Temp src Pulse Resp SpO2 Weight  09/08/11 0900 155/78 mmHg - - 79  16  99 % -  09/08/11 0822 142/76 mmHg - - 79  - 98 % -  09/08/11 0805 - 97.6 F (36.4 C) Oral - - - -  09/08/11 0700 153/77 mmHg - - 79  16  99 % -  09/08/11 0600 148/74 mmHg - - 79  14  99 % -  09/08/11 0500 143/73 mmHg - - 79  14  98 % 131 lb 13.4 oz (59.8 kg)  09/08/11 0400 142/62 mmHg 97.7 F (36.5 C) Oral 79  13  98 % -  09/08/11 0300 137/74 mmHg - - 79  12  97 % -  09/08/11 0200 143/79 mmHg - - 79  18  97 % -  09/08/11 0100 144/76 mmHg - - 79  16  97 % -  09/08/11 0000 127/70 mmHg 97.5 F (36.4 C) Oral 79  13  96 % -  09/07/11 2315 121/66 mmHg - - 79  0  98 % -  09/07/11 2300 143/75 mmHg - - 79  20  98 % -  09/07/11 2200 122/69 mmHg - - 79  19  96 % -  09/07/11 2100 122/68 mmHg - - 79  21  95 % -  09/07/11 2005 - 97.4 F (36.3 C) Oral - - - -  09/07/11 2000 136/78 mmHg - - 79  19  98 % -  09/07/11 1900 139/79 mmHg - - 79  22  99 % -  09/07/11 1800 143/74 mmHg - - 79  20  96 % -  09/07/11 1700 127/72 mmHg 98.1 F (36.7 C) - 79  25  96 % -  09/07/11 1641 - - - 79  - 96 % -  09/07/11 1600 135/78 mmHg 98.1 F (36.7 C) - 79  21  96 % -  09/07/11 1500 130/68 mmHg 97.9 F (36.6 C) - 79  23  97 % -  09/07/11 1400 138/77 mmHg 97.9 F (36.6 C) - 80  21  96 % -  09/07/11 1300 128/55 mmHg 97.7 F (36.5 C) - 83  23  94 % -  09/07/11 1207 - 97.9 F (36.6 C) Core - - - -  09/07/11 1200 134/61 mmHg 98.1 F (36.7 C) - 80  22  98 % -  09/07/11 1111 - 98.2 F (36.8 C) - 80  26  98 % -  09/07/11 1100 138/66 mmHg 98.2 F (36.8 C) - 84  23  95 % -  09/07/11 1048 148/72 mmHg 98.4 F (36.9 C) - 79  25  98 % -   Pre op weight  47.6 kg Current Weight    09/08/11 131 lb 13.4 oz (59.8 kg)    Hemodynamic parameters for last 24 hours: PAP: (18-37)/(11-31) 18/11 mmHg CO:  [2.7 L/min-2.8 L/min] 2.8 L/min CI:  [1.9 L/min/m2-2 L/min/m2] 2 L/min/m2  Intake/Output from previous day: 06/20 0701 - 06/21 0700 In: 783.6 [I.V.:591.6; NG/GT:30; IV Piggyback:162] Out: 3320 [Urine:2535; Emesis/NG output:200; Chest Tube:585]   Physical Exam:  Cardiovascular: AV paced.No murmur, gallop, or rub. Pulmonary: Diminished at bases bilaterally; no rales, wheezes, or rhonchi. Abdomen: Soft, non tender, bowel sounds present.  Extremities: Mild bilateral lower extremity edema. Wounds: Dressing is clean and dry.    Lab Results: CBC: Basename 09/08/11 0435 09/07/11 1653 09/07/11 1645  WBC 19.5* -- 19.9*  HGB 13.6 12.9 --  HCT 38.2 38.0 --  PLT 74* -- 83*   BMET:  Basename 09/08/11 0435 09/07/11 1653 09/07/11 0400  NA 140 144 --  K 4.3 4.0 --  CL 103 107 --  CO2 22 -- 18*  GLUCOSE 153* 152* --  BUN 25* 21 --  CREATININE 1.39* 1.20* --  CALCIUM 8.0* -- 7.0*     INR: Will add last result for INR, ABG once components are confirmed Will add last 4 CBG results once components are confirmed  Assessment/Plan:  1. CV - AV paced at 80. On Norvasc 10 daily.Not on a beta blocker as bp previously labile and continues to be AV paced. 2.  Pulmonary - Encourage incentive spirometer.CXR this am shows bilateral pleural effusions with atelectasis, and no ptx. 3. Possible begin gentle diuresis soon. 4.  Acute blood loss anemia - H and H stable at 13.6 and 38.2. 5. Probable remove foley later today. 6.Thrombocytopenia-Platelets slightly decreased to 74,000. 7.DM-CBGs 167/147/119.Continue SS PRN. Will wait to restart Glipizide as Creatinine 1.39 8.Mild renal insufficiency-creatinine slightly increased to 1.39. Re check in am.   ZIMMERMAN,DONIELLE MPA-C 09/08/2011  Diuresing well Follow creatinine

## 2011-09-08 NOTE — Progress Notes (Signed)
Clinical Social Work Department BRIEF PSYCHOSOCIAL ASSESSMENT 09/08/2011  Patient:  Barbara Garner, Barbara Garner     Account Number:  [id number]     Admit date:  09/06/2011  Clinical Social Worker:  Glenice Laine  Date/Time:  09/08/2011 02:00 PM  Referred by:  Care Management  Date Referred:  09/08/2011 Referred for  SNF Placement   Other Referral:   Interview type:  Patient Other interview type:    PSYCHOSOCIAL DATA Living Status:  FAMILY Admitted from facility:   Level of care:   Primary support name:  Randall-[phone] Primary support relationship to patient:  CHILD, ADULT Degree of support available:   Strong    CURRENT CONCERNS Current Concerns  Post-Acute Placement   Other Concerns:    SOCIAL WORK ASSESSMENT / PLAN CSW met with pt and family at bedside to discuss disposition. Pt lives with 2 of 3 sons who both work-Pt aware of possible d/c needs as are pt's sons-CSW provided SNF list as well as contact information and will initiate bed search   Assessment/plan status:  Psychosocial Support/Ongoing Assessment of Needs Other assessment/ plan:   Information/referral to community resources:   ST Rehab list    PATIENT'S/FAMILY'S RESPONSE TO PLAN OF CARE: Pt sons very understanding and relayed that they will proceed with any option that is best for pt.  Family is aware of SNF's in area and will review list together in preparation  CSW initiating bed search and will f/u PRN with options and any psychosocial support needed-  Margreta Journey Sehaj Kolden-LCSW-[phone]

## 2011-09-08 NOTE — Progress Notes (Signed)
Physical Therapy Treatment Patient Details Name: Barbara Garner MRN: [id number] DOB: 02/08/1935 Today's Date: 09/08/2011 Time: [id number] PT Time Calculation (min): 24 min  PT Assessment / Plan / Recommendation Comments on Treatment Session  Pt s/p CABG and AVR who continues to be limited with activity today by her lethargy. Pt still unaware of place or surgery and unable to recall precautions or day directly after education. Pt continues to need therapy to maximize function. RN present throughout session and aware of mobility, with pt returned to bed to remove chest tubes.     Follow Up Recommendations       Barriers to Discharge        Equipment Recommendations  Rolling walker with 5" wheels;3 in 1 bedside comode    Recommendations for Other Services    Frequency     Plan Discharge plan remains appropriate;Frequency remains appropriate    Precautions / Restrictions Precautions Precautions: Sternal;Fall Precaution Comments: chest tube, oxygen   Pertinent Vitals/Pain Soreness noted at chest, pt unable to rate or localize and absent without movement BP 142/76, HR 79, sats 93-98% throughout on 2L    Mobility  Bed Mobility Bed Mobility: Sit to Supine;Scooting to HOB Sit to Supine: 1: +2 Total assist;HOB flat Sit to Supine: Patient Percentage: 30% Scooting to HOB: 1: +2 Total assist Scooting to [facility]: Patient Percentage: 0% Details for Bed Mobility Assistance: cueing for sequence and assist to control descent to side and assist to elevate LLE onto surface Transfers Transfers: Sit to Stand;Stand to Sit Sit to Stand: 1: +2 Total assist;From chair/3-in-1 Sit to Stand: Patient Percentage: 60% Stand to Sit: 4: Min assist;To bed Details for Transfer Assistance: cueing for hand placement, sequence and anterior translation. Pt with increased assist to lean forward to initiate standing but good activation of quads to push into standing Ambulation/Gait Ambulation/Gait Assistance: 1: +2  Total assist Ambulation/Gait: Patient Percentage: 60% Ambulation Distance (Feet): 110 Feet Assistive device: Rolling walker Ambulation/Gait Assistance Details: Assist to control and direct RW, cues to step into RW and open eyes throughout. Additional assist to manage lines and bring chair behind pt total of +3 assist. Pt stepping well but unable to problem solve with mobility or recognize safety concerns for need to turn or obstacles.  Gait Pattern: Decreased stride length;Shuffle Stairs: No    Exercises     PT Diagnosis:    PT Problem List:   PT Treatment Interventions:     PT Goals Acute Rehab PT Goals Pt will go Supine/Side to Sit: with min assist PT Goal: Supine/Side to Sit - Progress: Revised due to lack of progress PT Goal: Sit to Supine/Side - Progress: Progressing toward goal Pt will go Sit to Stand: with min assist PT Goal: Sit to Stand - Progress: Revised due to lack of progress PT Goal: Stand to Sit - Progress: Progressing toward goal PT Goal: Ambulate - Progress: Progressing toward goal PT Goal: Up/Down Stairs - Progress: Progressing toward goal Additional Goals PT Goal: Additional Goal #1 - Progress: Not progressing  Visit Information  Last PT Received On: 09/08/11 Assistance Needed: +3 or more (+2 for pt handling, 3rd for chair) PT/OT Co-Evaluation/Treatment: Yes    Subjective Data  Subjective: "I don't know"   Cognition  Overall Cognitive Status: Impaired Area of Impairment: Attention;Memory Arousal/Alertness: Lethargic Orientation Level: Disoriented to;Place;Time;Situation Behavior During Session: Lethargic Current Attention Level: Focused Memory: Decreased recall of precautions Cognition - Other Comments: Pt continues to maintain eyes closed in sitting and only partially open in  standing without cueing. However, pt reports she is losing her vision and can't see much anyway    Balance  Static Sitting Balance Static Sitting - Balance Support: No upper  extremity supported;Feet supported Static Sitting - Level of Assistance: 4: Min assist Static Sitting - Comment/# of Minutes: Pt continues with tendency toward posterior lean EOB without tactile cueing and assist to maintain upright position presumed secondary to lethargy  End of Session PT - End of Session Equipment Utilized During Treatment: Gait belt Activity Tolerance: Other (comment) (pt tolerated but limited by lethargy) Patient left: in bed;with call bell/phone within reach;with nursing in room Nurse Communication: Mobility status    Melford Aase 09/08/2011, 8:30 AM Elwyn Reach, [place]

## 2011-09-08 NOTE — Clinical Documentation Improvement (Signed)
Clinical Documentation Improvement Program Query  THIS DOCUMENT IS NOT A PERMANENT PART OF THE MEDICAL RECORD          09/08/11  Dr. Roxy Manns and/or Associates,  In an effort to better capture your patient's severity of illness, reflect appropriate length of stay and utilization of resources, a review of the patient medical record has revealed the following indicators:  "One amp bicarb" Barbara Garner  09/07/2011     Progress Note    ABG Component     Latest Ref Rng 09/07/2011         5:34 AM  pH, Arterial     7.350 - 7.400 7.263 (L)  pCO2 arterial     35.0 - 45.0 mmHg 46.3 (Garner)  pO2, Arterial     80.0 - 100.0 mmHg 87.0  Bicarbonate     20.0 - 24.0 mEq/L 20.8  TCO2     0 - 100 mmol/L 22  O2 Saturation      95.0  Acid-base deficit     0.0 - 2.0 mmol/L 6.0 (Garner)  Patient temperature      37.4 C  Collection site        Drawn by        Sample type      ARTERIAL     Please document the condition treated with bicarb in the progress notes and discharge summary.     The fact queries are asked, does not imply that any particular answer is desired or expected.    Thank You,  Erling Conte  RN BSN CCDS Certified Clinical Documentation Specialist: Cell   [phone]  Health Information Management [place]   TO RESPOND TO THE THIS QUERY, FOLLOW THE INSTRUCTIONS BELOW:  1. If needed, update documentation for the patient's encounter via the notes activity.  2. Access this query again and click edit on the In [employer].  3. After updating, or not, click F2 to complete all highlighted (required) fields concerning your review. Select "additional documentation in the medical record" OR "no additional documentation provided".  4. Click Sign note button.  5. The deficiency will fall out of your In Basket *Please let us know if you are not able to complete this workflow by phone or e-mail (listed below).  Administration of sodium bicarb for metabolic acidosis is  common during and immediately following open heart surgery, and should be considered as part of the procedure.  This is related to the surgery not for a new diagnosis.

## 2011-09-08 NOTE — Progress Notes (Signed)
Nutrition Follow-up  Patient lethargic upon RD visitation. Breakfast tray on tray table; very little consumed. Will order Glucerna Shake supplement to help maximize PO intake.  Diet Order:  Carbohydrate Modified Medium Calorie  Meds: Scheduled Meds:   . acetaminophen  1,000 mg Oral Q6H  . amLODipine  10 mg Oral Daily  . amLODipine  5 mg Oral Once  . aspirin EC  325 mg Oral Daily  . bisacodyl  10 mg Oral Daily   Or  . bisacodyl  10 mg Rectal Daily  . cefUROXime (ZINACEF)  IV  1.5 g Intravenous Q12H  . docusate sodium  200 mg Oral Daily  . furosemide  20 mg Intravenous Q6H  . insulin aspart  0-24 Units Subcutaneous Q4H  . levothyroxine  50 mcg Oral QAC breakfast  . mupirocin ointment   Nasal BID  . pantoprazole  40 mg Oral Q1200  . sodium chloride  3 mL Intravenous Q12H  . DISCONTD: acetaminophen (TYLENOL) oral liquid 160 mg/5 mL  975 mg Per Tube Q6H  . DISCONTD: aspirin  324 mg Per Tube Daily  . DISCONTD: insulin aspart  0-24 Units Subcutaneous Q4H  . DISCONTD: metoprolol tartrate  12.5 mg Per Tube BID  . DISCONTD: metoprolol tartrate  12.5 mg Oral BID   Continuous Infusions:   . sodium chloride 20 mL/hr at 09/06/11 1647  . sodium chloride 20 mL/hr at 09/08/11 1023  . sodium chloride    . lactated ringers 20 mL/hr at 09/06/11 1900  . DISCONTD: dexmedetomidine (PRECEDEX) IV infusion Stopped (09/06/11 1800)  . DISCONTD: DOPamine Stopped (09/07/11 1300)  . DISCONTD: nitroGLYCERIN Stopped (09/07/11 2300)   PRN Meds:.labetalol, metoprolol, morphine injection, ondansetron (ZOFRAN) IV, sodium chloride  Labs:  CMP     Component Value Date/Time   NA 140 09/08/2011 0435   K 4.3 09/08/2011 0435   CL 103 09/08/2011 0435   CO2 22 09/08/2011 0435   GLUCOSE 153* 09/08/2011 0435   BUN 25* 09/08/2011 0435   CREATININE 1.39* 09/08/2011 0435   CALCIUM 8.0* 09/08/2011 0435   PROT 7.4 09/04/2011 1337   ALBUMIN 3.5 09/04/2011 1337   AST 20 09/04/2011 1337   ALT 11 09/04/2011 1337   ALKPHOS 107  09/04/2011 1337   BILITOT 0.3 09/04/2011 1337   GFRNONAA 36* 09/08/2011 0435   GFRAA 42* 09/08/2011 0435     Intake/Output Summary (Last 24 hours) at 09/08/11 1213 Last data filed at 09/08/11 1200  Gross per 24 hour  Intake 641.83 ml  Output   3690 ml  Net -3048.17 ml    CBG (last 3)   Basename 09/08/11 0802 09/08/11 0400 09/07/11 2354  GLUCAP 119* 147* 167*    Weight Status:  59.8 kg (6/21) -- trending up, mild edema  Re-estimated needs:  1200-1400 kcals, 65-75 gm protein  Nutrition Dx:  Inadequate Oral Intake now r/t lethargy & poor appetite as evidenced by tray observation, ongoing  Goal:  Oral intake with diet & supplements to meet >90% of estimated nutrition needs, currently unmet Monitor: PO intake, weight, labs, I/O's  Intervention:    Add Glucerna Shake 3 times daily between meals (220 kcals, 9.9 gm protein per 8 fl oz can)  RD to follow for nutrition care plan  Lady Deutscher Pager #:  [phone]

## 2011-09-08 NOTE — Progress Notes (Signed)
New orders written by MD just prior to shift change, art line removed but unable to carry out removal of chest tubes and sleeve due to shift change and time constraints.  Will pass this information to oncoming shift in report.

## 2011-09-08 NOTE — Progress Notes (Signed)
Patient ID: Barbara Garner, female   DOB: 1934/10/11, 77 y.o.   MRN: [id number]                   [address]            [place] [address]          [phone]     2 Days Post-Op Procedure(s) (LRB): AORTIC VALVE REPLACEMENT (AVR) (N/A) CORONARY ARTERY BYPASS GRAFTING (CABG) (N/A)  Total Length of Stay:  LOS: 2 days  BP 151/69  Pulse 79  Temp 97.3 F (36.3 C) (Oral)  Resp 16  Wt 131 lb 13.4 oz (59.8 kg)  SpO2 93%     . sodium chloride 20 mL/hr at 09/06/11 1647  . sodium chloride 20 mL/hr at 09/08/11 1900  . sodium chloride    . lactated ringers Stopped (09/08/11 0850)  . DISCONTD: nitroGLYCERIN Stopped (09/07/11 2300)     Lab Results  Component Value Date   WBC 19.5* 09/08/2011   HGB 13.6 09/08/2011   HCT 38.2 09/08/2011   PLT 74* 09/08/2011   GLUCOSE 153* 09/08/2011   CHOL 158 06/08/2011   TRIG 136.0 06/08/2011   HDL 51.70 06/08/2011   LDLCALC 79 06/08/2011   ALT 11 09/04/2011   AST 20 09/04/2011   NA 140 09/08/2011   K 4.3 09/08/2011   CL 103 09/08/2011   CREATININE 1.39* 09/08/2011   BUN 25* 09/08/2011   CO2 22 09/08/2011   TSH 2.60 08/19/2009   INR 1.46 09/06/2011   HGBA1C 6.7* 09/04/2011   MICROALBUR 21.4* 12/31/2009   More alert now but confused Av paced, underlying heart block  [name] MD  Beeper [phone] Office [phone] 09/08/2011 7:51 PM

## 2011-09-08 NOTE — Significant Event (Signed)
Patient BP has been elevated, with SBP in high 150s. PA Lake Bells and MD Roxan Hockey made aware. Continue with present orders. Pt is in complete heart block under pacing. Currently pt is AV paced @ 80. Pt has continued to be somnolent throughout the day, have ambulated X 2 today, with eyes closed and being drowsy. Have continued to work with patient with the Incentive Spirometry, OOB to chair, repositioning. Charmayne Odell, Therapist, sports.

## 2011-09-09 ENCOUNTER — Inpatient Hospital Stay (HOSPITAL_COMMUNITY): Payer: Medicare Other

## 2011-09-09 DIAGNOSIS — I442 Atrioventricular block, complete: Secondary | ICD-10-CM

## 2011-09-09 LAB — CBC
MCH: 30.9 pg (ref 26.0–34.0)
MCHC: 34.9 g/dL (ref 30.0–36.0)
MCV: 88.4 fL (ref 78.0–100.0)
Platelets: 72 10*3/uL — ABNORMAL LOW (ref 150–400)
RDW: 15.6 % — ABNORMAL HIGH (ref 11.5–15.5)

## 2011-09-09 LAB — BASIC METABOLIC PANEL
Calcium: 8.6 mg/dL (ref 8.4–10.5)
Creatinine, Ser: 1.27 mg/dL — ABNORMAL HIGH (ref 0.50–1.10)
GFR calc Af Amer: 47 mL/min — ABNORMAL LOW (ref >90)
GFR calc non Af Amer: 40 mL/min — ABNORMAL LOW (ref >90)
Sodium: 143 mEq/L (ref 135–145)

## 2011-09-09 LAB — GLUCOSE, CAPILLARY
Glucose-Capillary: 74 mg/dL (ref 70–99)
Glucose-Capillary: 80 mg/dL (ref 70–99)

## 2011-09-09 MED ORDER — FUROSEMIDE 10 MG/ML IJ SOLN
20.0000 mg | Freq: Once | INTRAMUSCULAR | Status: AC
  Administered 2011-09-09: 20 mg via INTRAVENOUS
  Filled 2011-09-09: qty 2

## 2011-09-09 NOTE — Consult Note (Signed)
Reason for Consult:CHB after AVR  Referring Physician: Allexa Tatom is an 77 y.o. female.   HPI: 77 yo woman with a h/o CAD, aortic stenosis s/p AVR and CABG several days ago complicated by the development of CHB. She is hemodynamically stable and is ventricularly pacing via her temporary PPM. She has ventricular escape rates in the 30's. She denies a h/o syncope. Pre-op she did have LBBB.   PMH: Past Medical History  Diagnosis Date  . Iron deficiency anemia, unspecified   . Chronic diastolic CHF (congestive heart failure)   . CAD (coronary artery disease)     a.  Unstable angina in 1997 with PTCA of large D1;  b. Myoview (9/10) at [facility] showed EF 84%, stable perfusion images with no ischemia.;  c. Cath 07/2011 - Severe Diffuse 3VD  . Type II or unspecified type diabetes mellitus without mention of complication, not stated as uncontrolled   . Contact dermatitis and other eczema, due to unspecified cause   . Esophageal reflux   . Headache   . Personal history of unspecified circulatory disease   . Pure hypercholesterolemia   . Unspecified essential hypertension   . Unspecified hypothyroidism   . Other specified erythematous condition   . Memory loss   . Unspecified hereditary and idiopathic peripheral neuropathy   . LBBB (left bundle branch block)   . Carotid stenosis     Carotid dopplers [id number] with [id number] LICA stenosis  . Severe aortic stenosis     a. 05/2011 Echo:  EF 55-65%, Gr1DD, Sev AS, Mild AI, PASP 47mmHg.  Marland Kitchen Extrinsic asthma, unspecified     hasn't used inhaler in past year  . CKD (chronic kidney disease) stage 4, GFR 15-29 ml/min     Cr 1.6 in 6/11, sees Dr. Arty Baumgartner  . Impaired vision     pt. reports that she identifies her meds by looking at the pillls, she is not able to read labels on bottles  . S/P aortic valve replacement 09/06/2011    19 mm [facility] Ease pericardial tissue valve  . S/P CABG x 3 09/06/2011    LIMA to diagonal branch, SVG to OM1,  SVG to PDA, EVH via bilateral thighs    PSHX: Past Surgical History  Procedure Date  . Ptca [phone]    5 blockages  . Cardiac catheterization   . Esophageal dilation 1997  . Colonoscopy 04/1999    1 polyp  . Esophagogastroduodenoscopy 04/1999    HH; "watermelon stomach" (severe gastritis)  . Dexa 09/2005    Osteopenia  . Cataract extraction 2003    Bilateral  . Esophagogastroduodenoscopy 04/2002    Gastritis  . Cardiovascular stress test 10/2002    normal (per patient)  . Abdominal hysterectomy 1977    partial; fibroids  . Bladder tack 1977  . Dexa 10/2003    osteoporosis  . Colonoscopy 06/2004    Neg. Int hem  . Carotid dopplers 2006  . Admit 09/2005    n/v; renal failure  . Refractive surgery 2006  . Opthy 11/1998;12/01;11/02  . Aortic valve replacement 09/06/2011    Procedure: AORTIC VALVE REPLACEMENT (AVR);  Surgeon: Rexene Alberts, MD;  Location: [facility];  Service: Open Heart Surgery;  Laterality: N/A;  . Coronary artery bypass graft 09/06/2011    Procedure: CORONARY ARTERY BYPASS GRAFTING (CABG);  Surgeon: Rexene Alberts, MD;  Location: [facility];  Service: Open Heart Surgery;  Laterality: N/A;    FAMHX: Family History  Problem  Relation Age of Onset  . Stroke Father     died in his 44's  . Stroke Mother     died in her 25's    Social History:  reports that she has never smoked. She does not have any smokeless tobacco history on file. She reports that she does not drink alcohol or use illicit drugs.  Allergies:  Allergies  Allergen Reactions  . Metformin     REACTION: intolerant  . Promethazine Hcl     REACTION: u/k    Medications: reviewed  Dg Chest Port 1 View  09/09/2011  *RADIOLOGY REPORT*  Clinical Data: Postop CABG and aortic valve replacement.  Follow up edema and effusions.  Chest tube removal.  PORTABLE CHEST - 1 VIEW 09/09/2011 0707 hours:  Comparison: Portable chest x-ray yesterday and dating back to 09/06/2011.  Findings: Interval left chest tube  removal.  Sternotomy for CABG and aortic valve replacement. Interval improvement in diffuse interstitial pulmonary edema, though minimal edema persists. Stable large bilateral pleural effusions and associated dense consolidation in the lower lobes.  No new pulmonary parenchymal abnormalities.  Right jugular central venous catheter tip in the SVC.  IMPRESSION: No pneumothorax after left chest tube removal.  Slight improvement in interstitial pulmonary edema since yesterday, though minimal edema persists.  Stable large bilateral pleural effusions and associated dense passive atelectasis and/or pneumonia in the lower lobes.  No new abnormalities.  Original Report Authenticated By: Deniece Portela, M.D.   Dg Chest Portable 1 View In Am  09/08/2011  *RADIOLOGY REPORT*  Clinical Data: Neck surgery  PORTABLE CHEST - 1 VIEW  Comparison: 09/07/2011  Findings: The patient has been extubated and the orogastric tube has been removed.  A Swan-Ganz introducer sheath and CVP are present from a right internal jugular approach and are both stable. Mediastinal drain and left sided thoracostomy drains are stable.  The patient is post median sternotomy, CABG and aortic valve replacement.  Bilateral pleural effusions and associated bibasilar atelectasis/infiltrate are again noted with some decrease in the size of the right pleural effusion since the previous evaluation.  Cardiomegaly and mediastinal contours are stable. Calcification overlying the cardiac contour would correlate with known mitral annulus calcification.  The upper lung zones appear relatively clear with vascular congestion stable.  No pneumothorax is seen.  IMPRESSION: Support apparatus as above. Persistent bilateral pleural effusions with bibasilar atelectasis/infiltrate.  Decreasing size of the right pleural fluid is noted.  Original Report Authenticated By: Ander Gaster, M.D.    ROS  As stated in the HPI and negative for all other systems.  Physical  Exam  Vitals:Blood pressure 133/71, pulse 80, temperature 98.6 F (37 C), temperature source Oral, resp. rate 14, weight 125 lb 14.1 oz (57.1 kg), SpO2 97.00%.  Well appearing elderly woman, NAD HEENT: Unremarkable Neck:  No JVD, no thyromegally Lungs:  Clear with no wheezes.  healing median sternotomy HEART:  Regular rate rhythm, no murmurs, no rubs, no clicks Abd:  Flat, positive bowel sounds, no organomegally, no rebound, no guarding Ext:  2 plus pulses, no edema, no cyanosis, no clubbing Skin:  No rashes no nodules Neuro:  CN II through XII intact, motor grossly intact  12 lead ECG - NSR with CHB and ventricular pacing  Assessment/Plan: 1. CHB - I have discussed the treatment options and have recommended PPM. The risks/benefits/goals/expectations of the procedure have been discussed and she wishes to proceed. This will be scheduled in the next few days. 2. AS, s/p AVR -  she is progressing nicely.  3. CAD - s/p CABG. Stable. Will follow.  Carleene Overlie TaylorMD 09/09/2011, 9:58 AM

## 2011-09-09 NOTE — Progress Notes (Signed)
Patient ID: Barbara Garner, female   DOB: 1934/09/05, 77 y.o.   MRN: [id number]                   [address]            [place] [address]          [phone]     3 Days Post-Op Procedure(s) (LRB): AORTIC VALVE REPLACEMENT (AVR) (N/A) CORONARY ARTERY BYPASS GRAFTING (CABG) (N/A)  Total Length of Stay:  LOS: 3 days  BP 140/67  Pulse 79  Temp 98 F (36.7 C) (Oral)  Resp 18  Wt 125 lb 14.1 oz (57.1 kg)  SpO2 92%     . sodium chloride 20 mL/hr at 09/06/11 1647  . sodium chloride 20 mL/hr (09/08/11 2213)  . sodium chloride    . lactated ringers Stopped (09/08/11 0850)     Lab Results  Component Value Date   WBC 21.9* 09/09/2011   HGB 13.3 09/09/2011   HCT 38.1 09/09/2011   PLT 72* 09/09/2011   GLUCOSE 88 09/09/2011   CHOL 158 06/08/2011   TRIG 136.0 06/08/2011   HDL 51.70 06/08/2011   LDLCALC 79 06/08/2011   ALT 11 09/04/2011   AST 20 09/04/2011   NA 143 09/09/2011   K 3.9 09/09/2011   CL 104 09/09/2011   CREATININE 1.27* 09/09/2011   BUN 27* 09/09/2011   CO2 28 09/09/2011   TSH 2.60 08/19/2009   INR 1.46 09/06/2011   HGBA1C 6.7* 09/04/2011   MICROALBUR 21.4* 12/31/2009   Stable, seen by Dr Lovena Le about pacer  [name] MD  Beeper [phone] Office [phone] 09/09/2011 7:14 PM

## 2011-09-09 NOTE — Progress Notes (Addendum)
Patient ID: Barbara Garner, female   DOB: April 23, 1934, 77 y.o.   MRN: [id number] TCTS DAILY PROGRESS NOTE                   [address]            [place] [address]          [phone]      3 Days Post-Op Procedure(s) (LRB): AORTIC VALVE REPLACEMENT (AVR) (N/A) CORONARY ARTERY BYPASS GRAFTING (CABG) (N/A)  Total Length of Stay:  LOS: 3 days   Subjective: Much more alert and talkative today  Objective: Vital signs in last 24 hours: Temp:  [97.3 F (36.3 C)-98.6 F (37 C)] 98.6 F (37 C) (06/22 0400) Pulse Rate:  [79-81] 80  (06/22 0700) Cardiac Rhythm:  [-] A-V Sequential paced (06/21 2000) Resp:  [12-21] 14  (06/22 0700) BP: (127-166)/(56-85) 133/71 mmHg (06/22 0700) SpO2:  [93 %-100 %] 97 % (06/22 0700) Weight:  [125 lb 14.1 oz (57.1 kg)] 125 lb 14.1 oz (57.1 kg) (06/22 0600)  Filed Weights   09/05/11 1300 09/08/11 0500 09/09/11 0600  Weight: 105 lb (47.628 kg) 131 lb 13.4 oz (59.8 kg) 125 lb 14.1 oz (57.1 kg)    Weight change: -5 lb 15.2 oz (-2.7 kg)   Hemodynamic parameters for last 24 hours:    Intake/Output from previous day: 06/21 0701 - 06/22 0700 In: 979 [P.O.:480; I.V.:449; IV Piggyback:50] Out: [id number] [Urine:3350; Chest Tube:80]  Intake/Output this shift:    Current Meds: Scheduled Meds:   . acetaminophen  1,000 mg Oral Q6H  . amLODipine  10 mg Oral Daily  . aspirin EC  325 mg Oral Daily  . bisacodyl  10 mg Oral Daily   Or  . bisacodyl  10 mg Rectal Daily  . cefUROXime (ZINACEF)  IV  1.5 g Intravenous Q12H  . Chlorhexidine Gluconate Cloth  6 each Topical Q0600  . docusate sodium  200 mg Oral Daily  . feeding supplement  237 mL Oral TID BM  . insulin aspart  0-24 Units Subcutaneous Q4H  . levothyroxine  50 mcg Oral QAC breakfast  . mupirocin ointment   Nasal BID  . pantoprazole  40 mg Oral Q1200  . sodium chloride  3 mL Intravenous Q12H   Continuous Infusions:   . sodium chloride 20 mL/hr at 09/06/11 1647  . sodium  chloride 20 mL/hr (09/08/11 2213)  . sodium chloride    . lactated ringers Stopped (09/08/11 0850)   PRN Meds:.labetalol, metoprolol, morphine injection, ondansetron (ZOFRAN) IV, sodium chloride  General appearance: alert and cooperative Neurologic: intact Heart: regular rate and rhythm, S1, S2 normal, no murmur, click, rub or gallop and normal apical impulse Lungs: clear to auscultation bilaterally and normal percussion bilaterally Abdomen: soft, non-tender; bowel sounds normal; no masses,  no organomegaly Extremities: extremities normal, atraumatic, no cyanosis or edema and Homans sign is negative, no sign of DVT Wound: slight seperation of left leg endo site  Lab Results: CBC: Basename 09/09/11 0415 09/08/11 0435  WBC 21.9* 19.5*  HGB 13.3 13.6  HCT 38.1 38.2  PLT 72* 74*   BMET:  Basename 09/09/11 0415 09/08/11 0435  NA 143 140  K 3.9 4.3  CL 104 103  CO2 28 22  GLUCOSE 88 153*  BUN 27* 25*  CREATININE 1.27* 1.39*  CALCIUM 8.6 8.0*    PT/INR:  Basename 09/06/11 1623  LABPROT 18.0*  INR 1.46   Radiology: Dg Chest Portable 1 View  In Am  09/08/2011  *RADIOLOGY REPORT*  Clinical Data: Neck surgery  PORTABLE CHEST - 1 VIEW  Comparison: 09/07/2011  Findings: The patient has been extubated and the orogastric tube has been removed.  A Swan-Ganz introducer sheath and CVP are present from a right internal jugular approach and are both stable. Mediastinal drain and left sided thoracostomy drains are stable.  The patient is post median sternotomy, CABG and aortic valve replacement.  Bilateral pleural effusions and associated bibasilar atelectasis/infiltrate are again noted with some decrease in the size of the right pleural effusion since the previous evaluation.  Cardiomegaly and mediastinal contours are stable. Calcification overlying the cardiac contour would correlate with known mitral annulus calcification.  The upper lung zones appear relatively clear with vascular congestion  stable.  No pneumothorax is seen.  IMPRESSION: Support apparatus as above. Persistent bilateral pleural effusions with bibasilar atelectasis/infiltrate.  Decreasing size of the right pleural fluid is noted.  Original Report Authenticated By: Ander Gaster, M.D.   Chest xray today- increased bilaterial pleural effusion  Assessment/Plan: S/P Procedure(s) (LRB): AORTIC VALVE REPLACEMENT (AVR) (N/A) CORONARY ARTERY BYPASS GRAFTING (CABG) (N/A) Mobilize Diuresis Diabetes control d/c tubes/lines Thrombocytopenia, avoid heparin Heart block continues Keep in unit leucocytosis  [name] MD  Beeper [phone] Office [phone] 09/09/2011 8:22 AM

## 2011-09-10 ENCOUNTER — Inpatient Hospital Stay (HOSPITAL_COMMUNITY): Payer: Medicare Other

## 2011-09-10 DIAGNOSIS — I442 Atrioventricular block, complete: Secondary | ICD-10-CM

## 2011-09-10 LAB — CBC
HCT: 37.5 % (ref 36.0–46.0)
Hemoglobin: 12.9 g/dL (ref 12.0–15.0)
MCH: 30.9 pg (ref 26.0–34.0)
MCHC: 34.4 g/dL (ref 30.0–36.0)
MCV: 89.9 fL (ref 78.0–100.0)
Platelets: 87 10*3/uL — ABNORMAL LOW (ref 150–400)
RBC: 4.17 MIL/uL (ref 3.87–5.11)
RDW: 15.1 % (ref 11.5–15.5)
WBC: 17.3 10*3/uL — ABNORMAL HIGH (ref 4.0–10.5)

## 2011-09-10 LAB — BASIC METABOLIC PANEL
BUN: 30 mg/dL — ABNORMAL HIGH (ref 6–23)
CO2: 29 mEq/L (ref 19–32)
Calcium: 8.2 mg/dL — ABNORMAL LOW (ref 8.4–10.5)
Chloride: 100 mEq/L (ref 96–112)
Creatinine, Ser: 1.29 mg/dL — ABNORMAL HIGH (ref 0.50–1.10)
GFR calc Af Amer: 46 mL/min — ABNORMAL LOW (ref >90)
GFR calc non Af Amer: 39 mL/min — ABNORMAL LOW (ref >90)
Glucose, Bld: 118 mg/dL — ABNORMAL HIGH (ref 70–99)
Potassium: 3.4 mEq/L — ABNORMAL LOW (ref 3.5–5.1)
Sodium: 139 mEq/L (ref 135–145)

## 2011-09-10 LAB — GLUCOSE, CAPILLARY
Glucose-Capillary: 120 mg/dL — ABNORMAL HIGH (ref 70–99)
Glucose-Capillary: 174 mg/dL — ABNORMAL HIGH (ref 70–99)

## 2011-09-10 MED ORDER — SODIUM CHLORIDE 0.9 % IV SOLN: 250.0000 mL | INTRAVENOUS | Status: DC

## 2011-09-10 MED ORDER — SODIUM CHLORIDE 0.45 % IV SOLN: INTRAVENOUS | Status: DC

## 2011-09-10 MED ORDER — POTASSIUM CHLORIDE 10 MEQ/50ML IV SOLN
10.0000 meq | INTRAVENOUS | Status: DC | PRN
  Administered 2011-09-10 (×2): 10 meq via INTRAVENOUS

## 2011-09-10 MED ORDER — GLUCOSE 40 % PO GEL
ORAL | Status: AC
  Administered 2011-09-10: 1
  Filled 2011-09-10: qty 1

## 2011-09-10 MED ORDER — SODIUM CHLORIDE 0.9 % IJ SOLN: 3.0000 mL | INTRAMUSCULAR | Status: DC | PRN

## 2011-09-10 MED ORDER — CEFAZOLIN SODIUM 1-5 GM-% IV SOLN
1.0000 g | INTRAVENOUS | Status: DC
  Filled 2011-09-10: qty 50

## 2011-09-10 MED ORDER — GENTAMICIN SULFATE 40 MG/ML IJ SOLN
80.0000 mg | INTRAMUSCULAR | Status: DC
  Filled 2011-09-10: qty 2

## 2011-09-10 MED ORDER — POTASSIUM CHLORIDE 10 MEQ/50ML IV SOLN
INTRAVENOUS | Status: AC
  Administered 2011-09-10: 10 meq
  Filled 2011-09-10: qty 150

## 2011-09-10 MED ORDER — FUROSEMIDE 10 MG/ML IJ SOLN
20.0000 mg | Freq: Once | INTRAMUSCULAR | Status: AC
  Administered 2011-09-10: 20 mg via INTRAVENOUS
  Filled 2011-09-10: qty 2

## 2011-09-10 MED ORDER — SODIUM CHLORIDE 0.9 % IJ SOLN: 3.0000 mL | Freq: Two times a day (BID) | INTRAMUSCULAR | Status: DC

## 2011-09-10 NOTE — Progress Notes (Signed)
Patient ID: Barbara Garner, female   DOB: 1934/09/04, 77 y.o.   MRN: [id number] TCTS DAILY PROGRESS NOTE                   [address]            [place], [address]          [phone]      4 Days Post-Op Procedure(s) (LRB): AORTIC VALVE REPLACEMENT (AVR) (N/A) CORONARY ARTERY BYPASS GRAFTING (CABG) (N/A)  Total Length of Stay:  LOS: 4 days   Subjective: More alert and talkative  Objective: Vital signs in last 24 hours: Temp:  [97.5 F (36.4 C)-98.4 F (36.9 C)] 98 F (36.7 C) (06/23 0740) Pulse Rate:  [79-81] 79  (06/23 0700) Cardiac Rhythm:  [-] A-V Sequential paced (06/22 2000) Resp:  [13-21] 18  (06/23 0700) BP: (113-155)/(52-69) 146/67 mmHg (06/23 0700) SpO2:  [92 %-97 %] 94 % (06/23 0700) FiO2 (%):  [50 %] 50 % (06/23 0700) Weight:  [123 lb 14.4 oz (56.2 kg)] 123 lb 14.4 oz (56.2 kg) (06/23 0700)  Filed Weights   09/08/11 0500 09/09/11 0600 09/10/11 0700  Weight: 131 lb 13.4 oz (59.8 kg) 125 lb 14.1 oz (57.1 kg) 123 lb 14.4 oz (56.2 kg)    Weight change: -1 lb 15.7 oz (-0.9 kg)   Hemodynamic parameters for last 24 hours:    Intake/Output from previous day: 06/22 0701 - 06/23 0700 In: 1340 [P.O.:1320; I.V.:20] Out: 3975 [Urine:3975]  Intake/Output this shift:    Current Meds: Scheduled Meds:   . acetaminophen  1,000 mg Oral Q6H  . amLODipine  10 mg Oral Daily  . aspirin EC  325 mg Oral Daily  . bisacodyl  10 mg Oral Daily   Or  . bisacodyl  10 mg Rectal Daily  . Chlorhexidine Gluconate Cloth  6 each Topical Q0600  . dextrose      . docusate sodium  200 mg Oral Daily  . feeding supplement  237 mL Oral TID BM  . furosemide  20 mg Intravenous Once  . furosemide  20 mg Intravenous Once  . insulin aspart  0-24 Units Subcutaneous Q4H  . levothyroxine  50 mcg Oral QAC breakfast  . mupirocin ointment   Nasal BID  . pantoprazole  40 mg Oral Q1200  . potassium chloride      . sodium chloride  3 mL Intravenous Q12H   Continuous  Infusions:   . sodium chloride 20 mL/hr at 09/06/11 1647  . sodium chloride 20 mL/hr (09/08/11 2213)  . sodium chloride    . lactated ringers Stopped (09/08/11 0850)   PRN Meds:.labetalol, metoprolol, morphine injection, ondansetron (ZOFRAN) IV, potassium chloride, sodium chloride  General appearance: alert, cooperative and no distress Neurologic: intact Heart: regular rate and rhythm, S1, S2 normal, no murmur, click, rub or gallop and av paced Lungs: clear to auscultation bilaterally Abdomen: soft, non-tender; bowel sounds normal; no masses,  no organomegaly Extremities: extremities normal, atraumatic, no cyanosis or edema Wound: sternum stable   Lab Results: CBC: Basename 09/10/11 0500 09/09/11 0415  WBC 17.3* 21.9*  HGB 12.9 13.3  HCT 37.5 38.1  PLT 87* 72*   BMET:  Basename 09/10/11 0500 09/09/11 0415  NA 139 143  K 3.4* 3.9  CL 100 104  CO2 29 28  GLUCOSE 118* 88  BUN 30* 27*  CREATININE 1.29* 1.27*  CALCIUM 8.2* 8.6    PT/INR: No results found for this basename: LABPROT,INR  in the last 72 hours Radiology: Dg Chest Port 1 View  09/10/2011  *RADIOLOGY REPORT*  Clinical Data: Postop CABG  PORTABLE CHEST - 1 VIEW  Comparison: 09/09/2011  Findings: Bibasilar consolidation and pleural effusions are stable. No pneumothorax.  Right internal jugular vein central venous catheter is stable.  Cardiomegaly.  IMPRESSION: Stable.  Original Report Authenticated By: Jamas Lav, M.D.   Dg Chest Port 1 View  09/09/2011  *RADIOLOGY REPORT*  Clinical Data: Postop CABG and aortic valve replacement.  Follow up edema and effusions.  Chest tube removal.  PORTABLE CHEST - 1 VIEW 09/09/2011 0707 hours:  Comparison: Portable chest x-ray yesterday and dating back to 09/06/2011.  Findings: Interval left chest tube removal.  Sternotomy for CABG and aortic valve replacement. Interval improvement in diffuse interstitial pulmonary edema, though minimal edema persists. Stable large bilateral pleural  effusions and associated dense consolidation in the lower lobes.  No new pulmonary parenchymal abnormalities.  Right jugular central venous catheter tip in the SVC.  IMPRESSION: No pneumothorax after left chest tube removal.  Slight improvement in interstitial pulmonary edema since yesterday, though minimal edema persists.  Stable large bilateral pleural effusions and associated dense passive atelectasis and/or pneumonia in the lower lobes.  No new abnormalities.  Original Report Authenticated By: Deniece Portela, M.D.     Assessment/Plan: S/P Procedure(s) (LRB): AORTIC VALVE REPLACEMENT (AVR) (N/A) CORONARY ARTERY BYPASS GRAFTING (CABG) (N/A) Gaining strength Weight decreased  Still heart block, poss pacer tomorrow   [name] MD  Beeper [phone] Office [phone] 09/10/2011 8:29 AM

## 2011-09-10 NOTE — Progress Notes (Signed)
Patient ID: Barbara Garner, female   DOB: [birth year], 77 y.o.   MRN: [id number]                   [address]            [place] [address]          [phone]     4 Days Post-Op Procedure(s) (LRB): AORTIC VALVE REPLACEMENT (AVR) (N/A) CORONARY ARTERY BYPASS GRAFTING (CABG) (N/A)  Total Length of Stay:  LOS: 4 days  BP 140/60  Pulse 80  Temp 97.6 F (36.4 C) (Axillary)  Resp 19  Ht 5\' 1"  (1.549 m)  Wt 123 lb 14.4 oz (56.2 kg)  BMI 23.41 kg/m2  SpO2 95%     . sodium chloride 20 mL/hr at 09/06/11 1647  . sodium chloride    . sodium chloride 20 mL/hr (09/08/11 2213)  . sodium chloride    . sodium chloride    . lactated ringers Stopped (09/08/11 0850)     Lab Results  Component Value Date   WBC 17.3* 09/10/2011   HGB 12.9 09/10/2011   HCT 37.5 09/10/2011   PLT 87* 09/10/2011   GLUCOSE 118* 09/10/2011   CHOL 158 06/08/2011   TRIG 136.0 06/08/2011   HDL 51.70 06/08/2011   LDLCALC 79 06/08/2011   ALT 11 09/04/2011   AST 20 09/04/2011   NA 139 09/10/2011   K 3.4* 09/10/2011   CL 100 09/10/2011   CREATININE 1.29* 09/10/2011   BUN 30* 09/10/2011   CO2 29 09/10/2011   TSH 2.60 08/19/2009   INR 1.46 09/06/2011   HGBA1C 6.7* 09/04/2011   MICROALBUR 21.4* 12/31/2009   Complete heart block, v rate in 40-50 with pacer off Poss pacer tomorrow  [name] MD  Beeper [phone] Office [phone] 09/10/2011 7:37 PM

## 2011-09-10 NOTE — Progress Notes (Signed)
Patient ID: Barbara Garner, female   DOB: 05/04/1934, 77 y.o.   MRN: [id number] Subjective:  No chest pain or sob. Looks better this morning  Objective:  Vital Signs in the last 24 hours: Temp:  [97.5 F (36.4 C)-98.4 F (36.9 C)] 98 F (36.7 C) (06/23 0740) Pulse Rate:  [79-81] 79  (06/23 0700) Resp:  [13-19] 18  (06/23 0700) BP: (113-155)/(52-69) 146/67 mmHg (06/23 0700) SpO2:  [92 %-97 %] 94 % (06/23 0700) FiO2 (%):  [50 %] 50 % (06/23 0700) Weight:  [123 lb 14.4 oz (56.2 kg)] 123 lb 14.4 oz (56.2 kg) (06/23 0700)  Intake/Output from previous day: 06/22 0701 - 06/23 0700 In: 1340 [P.O.:1320; I.V.:20] Out: 3975 [Urine:3975] Intake/Output from this shift:    Physical Exam: Frail, elderly appearing NAD HEENT: Unremarkable Neck:  No JVD, no thyromegally Lungs:  Clear except for basilar rales. HEART:  Regular rate rhythm, no murmurs, no rubs, no clicks Abd:  Flat, positive bowel sounds, no organomegally, no rebound, no guarding Ext:  2 plus pulses, no edema, no cyanosis, no clubbing Skin:  No rashes no nodules Neuro:  CN II through XII intact, motor grossly intact  Lab Results:  Basename 09/10/11 0500 09/09/11 0415  WBC 17.3* 21.9*  HGB 12.9 13.3  PLT 87* 72*    Basename 09/10/11 0500 09/09/11 0415  NA 139 143  K 3.4* 3.9  CL 100 104  CO2 29 28  GLUCOSE 118* 88  BUN 30* 27*  CREATININE 1.29* 1.27*   No results found for this basename: TROPONINI:2,CK,MB:2 in the last 72 hours Hepatic Function Panel No results found for this basename: PROT,ALBUMIN,AST,ALT,ALKPHOS,BILITOT,BILIDIR,IBILI in the last 72 hours No results found for this basename: CHOL in the last 72 hours No results found for this basename: PROTIME in the last 72 hours  Imaging: Dg Chest Port 1 View  09/10/2011  *RADIOLOGY REPORT*  Clinical Data: Postop CABG  PORTABLE CHEST - 1 VIEW  Comparison: 09/09/2011  Findings: Bibasilar consolidation and pleural effusions are stable. No pneumothorax.  Right  internal jugular vein central venous catheter is stable.  Cardiomegaly.  IMPRESSION: Stable.  Original Report Authenticated By: Jamas Lav, M.D.   Dg Chest Port 1 View  09/09/2011  *RADIOLOGY REPORT*  Clinical Data: Postop CABG and aortic valve replacement.  Follow up edema and effusions.  Chest tube removal.  PORTABLE CHEST - 1 VIEW 09/09/2011 0707 hours:  Comparison: Portable chest x-ray yesterday and dating back to 09/06/2011.  Findings: Interval left chest tube removal.  Sternotomy for CABG and aortic valve replacement. Interval improvement in diffuse interstitial pulmonary edema, though minimal edema persists. Stable large bilateral pleural effusions and associated dense consolidation in the lower lobes.  No new pulmonary parenchymal abnormalities.  Right jugular central venous catheter tip in the SVC.  IMPRESSION: No pneumothorax after left chest tube removal.  Slight improvement in interstitial pulmonary edema since yesterday, though minimal edema persists.  Stable large bilateral pleural effusions and associated dense passive atelectasis and/or pneumonia in the lower lobes.  No new abnormalities.  Original Report Authenticated By: Deniece Portela, M.D.    Cardiac Studies: Tele - NSR with CHB and RV pacing Assessment/Plan:  1. CHB after AVR 2. Prior LBBB 3. HTN 4. Elevated WBC, improving with no fever Plan - PPM tomorrow with Dr. Caryl Comes. I have discussed the risks/benefits/goals/expectations and she wishes to proceed.  LOS: 4 days    Cristopher Peru M.D. 09/10/2011, 9:37 AM

## 2011-09-11 ENCOUNTER — Inpatient Hospital Stay (HOSPITAL_COMMUNITY): Payer: Medicare Other

## 2011-09-11 ENCOUNTER — Encounter (HOSPITAL_COMMUNITY)
Admission: RE | Disposition: A | Discharge status: Skilled Nursing Facility | Payer: Self-pay | Source: Ambulatory Visit | Attending: Thoracic Surgery (Cardiothoracic Vascular Surgery)

## 2011-09-11 DIAGNOSIS — I442 Atrioventricular block, complete: Secondary | ICD-10-CM

## 2011-09-11 HISTORY — PX: PERMANENT PACEMAKER INSERTION: SHX5480

## 2011-09-11 LAB — CBC
HCT: 39.7 % (ref 36.0–46.0)
Hemoglobin: 13.3 g/dL (ref 12.0–15.0)
MCH: 30.5 pg (ref 26.0–34.0)
MCHC: 33.5 g/dL (ref 30.0–36.0)
MCV: 91.1 fL (ref 78.0–100.0)
Platelets: 111 10*3/uL — ABNORMAL LOW (ref 150–400)
RBC: 4.36 MIL/uL (ref 3.87–5.11)
RDW: 14.8 % (ref 11.5–15.5)
WBC: 12.9 10*3/uL — ABNORMAL HIGH (ref 4.0–10.5)

## 2011-09-11 LAB — GLUCOSE, CAPILLARY
Glucose-Capillary: 119 mg/dL — ABNORMAL HIGH (ref 70–99)
Glucose-Capillary: 137 mg/dL — ABNORMAL HIGH (ref 70–99)

## 2011-09-11 LAB — BASIC METABOLIC PANEL
BUN: 32 mg/dL — ABNORMAL HIGH (ref 6–23)
CO2: 29 mEq/L (ref 19–32)
Calcium: 8.4 mg/dL (ref 8.4–10.5)
Chloride: 99 mEq/L (ref 96–112)
Creatinine, Ser: 1.18 mg/dL — ABNORMAL HIGH (ref 0.50–1.10)
GFR calc Af Amer: 51 mL/min — ABNORMAL LOW (ref >90)
GFR calc non Af Amer: 44 mL/min — ABNORMAL LOW (ref >90)
Glucose, Bld: 107 mg/dL — ABNORMAL HIGH (ref 70–99)
Potassium: 3.8 mEq/L (ref 3.5–5.1)
Sodium: 138 mEq/L (ref 135–145)

## 2011-09-11 SURGERY — PERMANENT PACEMAKER INSERTION
Anesthesia: LOCAL

## 2011-09-11 MED ORDER — CEFAZOLIN SODIUM 1-5 GM-% IV SOLN
INTRAVENOUS | Status: AC
  Filled 2011-09-11: qty 50

## 2011-09-11 MED ORDER — CEFAZOLIN SODIUM 1-5 GM-% IV SOLN
1.0000 g | Freq: Four times a day (QID) | INTRAVENOUS | Status: AC
  Administered 2011-09-11 – 2011-09-12 (×3): 1 g via INTRAVENOUS
  Filled 2011-09-11 (×4): qty 50

## 2011-09-11 MED ORDER — FENTANYL CITRATE 0.05 MG/ML IJ SOLN
INTRAMUSCULAR | Status: AC
  Filled 2011-09-11: qty 2

## 2011-09-11 MED ORDER — PHENOL 1.4 % MT LIQD
1.0000 | OROMUCOSAL | Status: DC | PRN
  Administered 2011-09-11: 1 via OROMUCOSAL
  Filled 2011-09-11: qty 177

## 2011-09-11 MED ORDER — SODIUM CHLORIDE 0.9 % IV SOLN
INTRAVENOUS | Status: AC
  Administered 2011-09-11: 16:00:00 via INTRAVENOUS

## 2011-09-11 MED ORDER — ACETAMINOPHEN 325 MG PO TABS
325.0000 mg | ORAL_TABLET | ORAL | Status: DC | PRN
  Administered 2011-09-12: 500 mg via ORAL
  Administered 2011-09-12 – 2011-09-20 (×24): 650 mg via ORAL
  Filled 2011-09-11 (×25): qty 2

## 2011-09-11 MED ORDER — AMIODARONE HCL 200 MG PO TABS
400.0000 mg | ORAL_TABLET | Freq: Two times a day (BID) | ORAL | Status: DC
  Filled 2011-09-11: qty 2

## 2011-09-11 MED ORDER — AMIODARONE HCL 200 MG PO TABS
400.0000 mg | ORAL_TABLET | Freq: Two times a day (BID) | ORAL | Status: DC
  Administered 2011-09-11: 400 mg via ORAL
  Filled 2011-09-11 (×3): qty 2

## 2011-09-11 MED ORDER — ONDANSETRON HCL 4 MG/2ML IJ SOLN
4.0000 mg | Freq: Four times a day (QID) | INTRAMUSCULAR | Status: DC | PRN
  Administered 2011-09-19: 4 mg via INTRAVENOUS
  Filled 2011-09-11 (×2): qty 2

## 2011-09-11 MED ORDER — ENOXAPARIN SODIUM 30 MG/0.3ML ~~LOC~~ SOLN
30.0000 mg | SUBCUTANEOUS | Status: DC
  Administered 2011-09-12 – 2011-09-19 (×9): 30 mg via SUBCUTANEOUS
  Filled 2011-09-11 (×10): qty 0.3

## 2011-09-11 MED ORDER — MIDAZOLAM HCL 5 MG/5ML IJ SOLN
INTRAMUSCULAR | Status: AC
  Filled 2011-09-11: qty 5

## 2011-09-11 MED ORDER — METOPROLOL TARTRATE 25 MG PO TABS
25.0000 mg | ORAL_TABLET | Freq: Two times a day (BID) | ORAL | Status: DC
  Administered 2011-09-11 – 2011-09-12 (×3): 25 mg via ORAL
  Filled 2011-09-11 (×5): qty 1

## 2011-09-11 MED ORDER — ATORVASTATIN CALCIUM 40 MG PO TABS
40.0000 mg | ORAL_TABLET | Freq: Every day | ORAL | Status: DC
  Administered 2011-09-11 – 2011-09-19 (×9): 40 mg via ORAL
  Filled 2011-09-11 (×12): qty 1

## 2011-09-11 NOTE — Progress Notes (Signed)
Physical Therapy Treatment Patient Details Name: Barbara Garner MRN: [id number] DOB: 18-Jun-1934 Today's Date: 01-13-202013 Time: [id number] PT Time Calculation (min): 21 min  PT Assessment / Plan / Recommendation Comments on Treatment Session  Pt s/p CABG and AVR who is progressing very well today without demonstration of AMS today. Pt unable to recall precautions and educated for all sternal precautions and restrictions with mobility. If pt continues to progress in this manner than HHPT may be the most suitabel option with clear mental status. Will continue to follow, pt planned for pacer placement today.     Follow Up Recommendations  Home health PT;Supervision for mobility/OOB    Barriers to Discharge        Equipment Recommendations  3 in 1 bedside comode;Rolling walker with 5" wheels    Recommendations for Other Services    Frequency     Plan Discharge plan needs to be updated    Precautions / Restrictions Precautions Precautions: Sternal;Fall (Simultaneous filing. User may not have seen previous data.) Precaution Comments: no   Pertinent Vitals/Pain No pain HR 85-110 with ambulation sats 96% on RA throughout    Mobility  Bed Mobility Bed Mobility: Rolling Right;Right Sidelying to Sit;Sitting - Scoot to [employer] of Bed;Sit to Sidelying Right Rolling Right: 5: Supervision Right Sidelying to Sit: 4: Min guard;HOB flat Sitting - Scoot to Edge of Bed: 5: Supervision Sit to Sidelying Right: 4: Min guard;HOB flat Details for Bed Mobility Assistance: cueing for sequence and precautions maintaining sternal integrity throughout with two finger assist to elevate legs onto surface Transfers Transfers: Sit to Stand;Stand to Sit Sit to Stand: 4: Min guard;From bed Stand to Sit: 4: Min guard;To bed Details for Transfer Assistance: cueing for hand placement and sequence Ambulation/Gait Ambulation/Gait Assistance: 5: Supervision Ambulation Distance (Feet): 300 Feet Assistive device:  Rolling walker Ambulation/Gait Assistance Details: cueing to step into RW and for posture with encouragement to increase distance Gait Pattern: Decreased stride length;Step-through pattern Stairs: No    Exercises     PT Diagnosis:    PT Problem List:   PT Treatment Interventions:     PT Goals Acute Rehab PT Goals Pt will go Supine/Side to Sit: with modified independence PT Goal: Supine/Side to Sit - Progress: Updated due to goal met Pt will go Sit to Supine/Side: with modified independence PT Goal: Sit to Supine/Side - Progress: Updated due to goal met Pt will go Sit to Stand: with modified independence PT Goal: Sit to Stand - Progress: Updated due to goal met Pt will go Stand to Sit: with modified independence PT Goal: Stand to Sit - Progress: Updated due to goals met Pt will Ambulate: >150 feet;with least restrictive assistive device;with modified independence PT Goal: Ambulate - Progress: Updated due to goal met PT Goal: Up/Down Stairs - Progress: Progressing toward goal Additional Goals PT Goal: Additional Goal #1 - Progress: Progressing toward goal  Visit Information  Last PT Received On: 09/11/11 Assistance Needed: +1 (Simultaneous filing. User may not have seen previous data.) PT/OT Co-Evaluation/Treatment: Yes    Subjective Data  Subjective: you all just want to give me a hard time   Cognition  Overall Cognitive Status: Appears within functional limits for tasks assessed/performed Arousal/Alertness: Awake/alert Orientation Level: Appears intact for tasks assessed Behavior During Session: [facility] for tasks performed Memory: Decreased recall of precautions    Balance  Static Sitting Balance Static Sitting - Balance Support: No upper extremity supported;Feet supported Static Sitting - Level of Assistance: 7: Independent Static Sitting -  Comment/# of Minutes: 2  End of Session PT - End of Session Activity Tolerance: Patient tolerated treatment well Patient left: in  bed;with call bell/phone within reach;with family/visitor present    Melford Aase 06/05/202013, 11:32 AM

## 2011-09-11 NOTE — Progress Notes (Signed)
Occupational Therapy Treatment Patient Details Name: Barbara Garner MRN: [id number] DOB: 13-Jun-1934 Today's Date: 2020/10/511 Time: [id number] OT Time Calculation (min): 21 min  OT Assessment / Plan / Recommendation Comments on Treatment Session Pt MUCH more alert this session and is mobilizing well. Pt could likely d/c home with family assist.    Follow Up Recommendations  Home health OT    Barriers to Discharge       Equipment Recommendations  3 in 1 bedside comode;Rolling walker with 5" wheels    Recommendations for Other Services    Frequency     Plan Discharge plan needs to be updated    Precautions / Restrictions Precautions Precautions: Sternal;Fall (Simultaneous filing. User may not have seen previous data.) Precaution Comments: no   Pertinent Vitals/Pain     ADL  Grooming: Performed;Min guard;Brushing hair Where Assessed - Grooming: Supported standing Lower Body Dressing: Set up Where Assessed - Lower Body Dressing: Unsupported sitting Toilet Transfer: Simulated;Minimal assistance Toilet Transfer Equipment: Other (comment) (back to bed.) ADL Comments: Pt MUCH more alert this session.    OT Diagnosis:    OT Problem List:   OT Treatment Interventions:     OT Goals ADL Goals ADL Goal: Eating - Progress: Met ADL Goal: Grooming - Progress: Progressing toward goals Pt Will Perform Lower Body Bathing: with modified independence;Sit to stand from chair;Sit to stand from bed ADL Goal: Lower Body Bathing - Progress: Goal set today Pt Will Perform Lower Body Dressing: with modified independence;Sit to stand from chair;Sit to stand from bed ADL Goal: Lower Body Dressing - Progress: Goal set today Pt Will Transfer to Toilet: with modified independence ADL Goal: Toilet Transfer - Progress: Updated due to goal met Pt Will Perform Toileting - Clothing Manipulation: with modified independence ADL Goal: Toileting - Clothing Manipulation - Progress: Updated due to goal met Pt  Will Perform Toileting - Hygiene: with modified independence ADL Goal: Toileting - Hygiene - Progress: Updated due to goal met Miscellaneous OT Goals OT Goal: Miscellaneous Goal #1 - Progress: Met OT Goal: Miscellaneous Goal #2 - Progress: Progressing toward goals  Visit Information  Last OT Received On: 09/11/11 Assistance Needed: +1 (Simultaneous filing. User may not have seen previous data.) PT/OT Co-Evaluation/Treatment: Yes    Subjective Data  Subjective: I dont remember much of anything from last week.   Prior Functioning       Cognition  Overall Cognitive Status: Appears within functional limits for tasks assessed/performed Arousal/Alertness: Awake/alert Orientation Level: Appears intact for tasks assessed Behavior During Session: [facility] for tasks performed Memory: Decreased recall of precautions    Mobility Bed Mobility Bed Mobility: Rolling Right;Right Sidelying to Sit;Sitting - Scoot to [employer] of Bed;Sit to Sidelying Right Rolling Right: 5: Supervision Right Sidelying to Sit: 4: Min guard;HOB flat Sitting - Scoot to Edge of Bed: 5: Supervision Sit to Sidelying Right: 4: Min guard;HOB flat Details for Bed Mobility Assistance: cueing for sequence and precautions maintaining sternal integrity throughout with two finger assist to elevate legs onto surface Transfers Sit to Stand: 4: Min guard;From bed Stand to Sit: 4: Min guard;To bed Details for Transfer Assistance: cueing for hand placement and sequence   Exercises    Balance Static Sitting Balance Static Sitting - Balance Support: No upper extremity supported;Feet supported Static Sitting - Level of Assistance: 7: Independent Static Sitting - Comment/# of Minutes: 2  End of Session OT - End of Session Equipment Utilized During Treatment: Gait belt Activity Tolerance: Patient tolerated treatment well Patient left:  in bed;with call bell/phone within reach;with family/visitor present   Barbara Garner A OTR/L  [id number] 11/08/202013, 11:33 AM

## 2011-09-11 NOTE — Progress Notes (Signed)
Pt with paf  Spoke with Dr DM and began pt on amiodarone today.  Would begin coumadin but will defer its initiation timing to Dr CO

## 2011-09-11 NOTE — CV Procedure (Signed)
Preop DX: complete heart block: Post op DX:: same  Procedure dual chamber pacemaker implantation Contrast venogram   After routine prep and drape, lidocaine was infiltrated in the prepectoral subclavicular region on the left side an incision was made and carried down to later the prepectoral fascia using electrocautery and sharp dissection a pocket was formed similarly. Hemostasis was obtained.  After this, we turned our attention to gaining access to the extrathoracic,left subclavian vein. This was accomplished with moderate difficulty as the course was more cephalad than i had anticipated and contrast venography was utilized to demonstrate its course  We then were avle toaccess without difficulty and without the aspiration of air or puncture of the artery. 2 separate venipunctures were accomplished; guidewires were placed and retained and sequentially 7 French sheath through which were  passed an [employer] passive ventricular lead serial [id number] and an Medtronic active fixtation  atrial lead serial number [id number] .  The ventricular lead was manipulated to the right ventricular apex with a bipolar R wave was 15, the pacing impedance was 633, the threshold was 0.4 @ 0.5 msec  Current at threshold was   0.6 ma    The right atrial lead was manipulated to the right atrial appendage with a bipolar P-wave  2.0, the pacing impedance was699, the threshold1.8@ 0.5 msec   Current at threshold was3.6 and the current of injury was brisk.  . The leads were affixed to the prepectoral fascia and attached to a Medtronic  pulse generator serial number [id number].  Hemostasis was obtained. The pocket was copiously irrigated with antibiotic containing saline solution. The leads and the pulse generator were placed in the pocket and affixed to the prepectoral fascia. The wound is then closed in 3 layers in normal fashion. The wound is washed dried and a benzoin Steri-Strip this was applied  the account sponge counts and instrument counts were correct at the end of the procedure .Marland Kitchen The patient tolerated the procedure without apparent complication.  Maryagnes Amos.D.

## 2011-09-11 NOTE — Progress Notes (Addendum)
   CARDIOTHORACIC SURGERY PROGRESS NOTE   R5 Days Post-Op Procedure(s) (LRB): AORTIC VALVE REPLACEMENT (AVR) (N/A) CORONARY ARTERY BYPASS GRAFTING (CABG) (N/A)  Subjective: Patient feels well.  Mild soreness in chest.  Breathing comfortably.  Marginal appetite.  Objective: Vital signs: BP Readings from Last 1 Encounters:  09/11/11 159/66   Pulse Readings from Last 1 Encounters:  09/11/11 80   Resp Readings from Last 1 Encounters:  09/11/11 18   Temp Readings from Last 1 Encounters:  09/11/11 98.2 F (36.8 C) Oral    Hemodynamics:    Physical Exam:  Rhythm:   3rd degree block, DDD paced  Breath sounds: Diminished at bases  Heart sounds:  RRR  Incisions:  Dressings dry  Abdomen:  soft  Extremities:  warm   Intake/Output from previous day: 06/23 0701 - 06/24 0700 In: 960 [P.O.:960] Out: 2050 [Urine:2050] Intake/Output this shift: Total I/O In: -  Out: 600 [Urine:600]  Lab Results:  Basename 09/11/11 0425 09/10/11 0500  WBC 12.9* 17.3*  HGB 13.3 12.9  HCT 39.7 37.5  PLT 111* 87*   BMET:  Basename 09/11/11 0425 09/10/11 0500  NA 138 139  K 3.8 3.4*  CL 99 100  CO2 29 29  GLUCOSE 107* 118*  BUN 32* 30*  CREATININE 1.18* 1.29*  CALCIUM 8.4 8.2*    CBG (last 3)   Basename 09/11/11 0747 09/11/11 0341 09/10/11 2352  GLUCAP 101* 107* 103*   ABG    Component Value Date/Time   PHART 7.291* 09/07/2011 1158   HCO3 22.8 09/07/2011 1158   TCO2 19 09/07/2011 1653   ACIDBASEDEF 4.0* 09/07/2011 1158   O2SAT 99.0 09/07/2011 1158   CXR: Bibasilar atelectasis +/- small effusions  Assessment/Plan: S/P Procedure(s) (LRB): AORTIC VALVE REPLACEMENT (AVR) (N/A) CORONARY ARTERY BYPASS GRAFTING (CABG) (N/A)  Overall doing well POD5 Post op 3rd degree AV block Expected post op volume excess, mild, diuresing well but weight still >> baseline Type II diabetes mellitus, excellent glycemic control off oral agent HTN well controlled   For perm pacer  today  Mobilize and transfer 2000 after pacer  Continue diuresis  Restart oral diabetic agent once po intake improves   Ozie Lupe H 10-25-202013 8:41 AM

## 2011-09-11 NOTE — Progress Notes (Signed)
[facility] BRIEF SICU PROGRESS NOTE  Day of Surgery  S/P Procedure(s) (LRB): PERMANENT PACEMAKER INSERTION (N/A)   Stable following return from pacemaker placement Rhythm currently appears to be sinus tach with V-pacing, HR 110-120  Plan: Will start beta blocker, continue amiodarone for now.  Add lovenox for DVT prophylaxis  Barbara Garner H 01/17/202013 8:48 PM

## 2011-09-11 NOTE — Progress Notes (Signed)
Clinical Social Work Department CLINICAL SOCIAL WORK PLACEMENT NOTE 15-Apr-202013  Patient:  DENSIE, ZINGSHEIM  Account Number:  [id number] Admit date:  09/06/2011  Clinical Social Worker:  Reece Packer, LCSW  Date/time:  015-Apr-202013 10:00 AM  Clinical Social Work is seeking post-discharge placement for this patient at the following level of care:   [place]   (*CSW will update this form in Epic as items are completed)   09/08/2011  Patient/family provided with [place] Department of Clinical Social Work's list of facilities offering this level of care within the geographic area requested by the patient (or if unable, by the patient's family).  015-Apr-202013  Patient/family informed of their freedom to choose among providers that offer the needed level of care, that participate in Medicare, Medicaid or managed care program needed by the patient, have an available bed and are willing to accept the patient.  015-Apr-202013  Patient/family informed of [facility]' ownership interest in [facility], as well as of the fact that they are under no obligation to receive care at this facility.  PASARR submitted to EDS on 015-Apr-202013 PASARR number received from EDS on   FL2 transmitted to all facilities in geographic area requested by pt/family on  015-Apr-202013 FL2 transmitted to all facilities within larger geographic area on   Patient informed that his/her managed care company has contracts with or will negotiate with  certain facilities, including the following:     Patient/family informed of bed offers received:   Patient chooses bed at  Physician recommends and patient chooses bed at    Patient to be transferred to  on   Patient to be transferred to facility by   The following physician request were entered in Epic:   Additional Comments:  Gerre Scull, [phone]

## 2011-09-11 NOTE — Progress Notes (Signed)
   ELECTROPHYSIOLOGY ROUNDING NOTE    Patient Name: Barbara Garner Date of Encounter: 30-Dec-202013    [place] with incisional soreness.  Breathing better.  Pending pacemaker implant today with Dr Caryl Comes for CHB status post AVR.  Pt afebrile.  WBC trending down.   TELEMETRY: Reviewed telemetry pt in AV pacing with CHB below pacer (rate 40's) Filed Vitals:   09/11/11 0300 09/11/11 0400 09/11/11 0500 09/11/11 0600  BP: 129/70 158/57 142/51 150/54  Pulse: 80 80 80 80  Temp:  98 F (36.7 C)    TempSrc:  Oral    Resp: 16 18 16 18   Height:      Weight:   121 lb 4.1 oz (55 kg)   SpO2: 93% 94% 93% 94%   .Well developed and nourished in no acute distress HENT normal Neck supple with JVP-flat-IJ -R in place Clear Regular rate and rhythm, no murmurs or gallops Abd-soft with active BS No Clubbing cyanosis edema Skin-warm and dry A & Oriented  Grossly normal sensory and motor function  AV pacing with intermittent AFib  Intake/Output Summary (Last 24 hours) at 09/11/11 0734 Last data filed at 09/11/11 0000  Gross per 24 hour  Intake    960 ml  Output   2050 ml  Net  -1090 ml    LABS: Basic Metabolic Panel:  Basename 09/11/11 0425 09/10/11 0500  NA 138 139  K 3.8 3.4*  CL 99 100  CO2 29 29  GLUCOSE 107* 118*  BUN 32* 30*  CREATININE 1.18* 1.29*  CALCIUM 8.4 8.2*  MG -- --  PHOS -- --   CBC:  Basename 09/11/11 0425 09/10/11 0500  WBC 12.9* 17.3*  NEUTROABS -- --  HGB 13.3 12.9  HCT 39.7 37.5  MCV 91.1 89.9  PLT 111* 87*    Radiology/Studies:  Dg Chest Port 1 View 09/10/2011  *RADIOLOGY REPORT*  Clinical Data: Postop CABG  PORTABLE CHEST - 1 VIEW  Comparison: 09/09/2011  Findings: Bibasilar consolidation and pleural effusions are stable. No pneumothorax.  Right internal jugular vein central venous catheter is stable.  Cardiomegaly.  IMPRESSION: Stable.  Original Report Authenticated By: Jamas Lav, M.  Assessment/Impression   Patient Active Hospital  Problem List: S/P aortic valve replacement (09/06/2011)   S/P CABG x 3 (09/06/2011)   Complete heart block (09/09/2011)  LBBB  Afib  Pt with post op CHB persisting in setting of underlying LBBB.  Also with AFib with slow ventricular response  Will plan  1) pacer 2) needs oral anticoagulation -per Dr CO  The benefits and risks were reviewed including but not limited to death,  perforation, infection, lead dislodgement and device malfunction.  The patient understands agrees and is willing to proceed.

## 2011-09-12 ENCOUNTER — Inpatient Hospital Stay (HOSPITAL_COMMUNITY): Payer: Medicare Other

## 2011-09-12 DIAGNOSIS — I4891 Unspecified atrial fibrillation: Secondary | ICD-10-CM | POA: Diagnosis not present

## 2011-09-12 LAB — CBC
HCT: 40.6 % (ref 36.0–46.0)
Hemoglobin: 13.4 g/dL (ref 12.0–15.0)
MCV: 91.6 fL (ref 78.0–100.0)
RBC: 4.43 MIL/uL (ref 3.87–5.11)
WBC: 9.9 10*3/uL (ref 4.0–10.5)

## 2011-09-12 LAB — GLUCOSE, CAPILLARY
Glucose-Capillary: 123 mg/dL — ABNORMAL HIGH (ref 70–99)
Glucose-Capillary: 97 mg/dL (ref 70–99)

## 2011-09-12 LAB — BASIC METABOLIC PANEL
CO2: 26 mEq/L (ref 19–32)
Chloride: 99 mEq/L (ref 96–112)
Potassium: 3.7 mEq/L (ref 3.5–5.1)
Sodium: 137 mEq/L (ref 135–145)

## 2011-09-12 MED ORDER — INSULIN ASPART 100 UNIT/ML ~~LOC~~ SOLN
0.0000 [IU] | SUBCUTANEOUS | Status: DC
  Administered 2011-09-12: 8 [IU] via SUBCUTANEOUS
  Administered 2011-09-13: 2 [IU] via SUBCUTANEOUS
  Administered 2011-09-13: 4 [IU] via SUBCUTANEOUS
  Administered 2011-09-13 (×2): 2 [IU] via SUBCUTANEOUS
  Administered 2011-09-13: 4 [IU] via SUBCUTANEOUS
  Administered 2011-09-13 – 2011-09-14 (×2): 2 [IU] via SUBCUTANEOUS
  Administered 2011-09-14: 8 [IU] via SUBCUTANEOUS
  Administered 2011-09-14: 4 [IU] via SUBCUTANEOUS
  Administered 2011-09-14 – 2011-09-15 (×2): 2 [IU] via SUBCUTANEOUS
  Administered 2011-09-15: 16 [IU] via SUBCUTANEOUS
  Administered 2011-09-15: 2 [IU] via SUBCUTANEOUS
  Administered 2011-09-15: 4 [IU] via SUBCUTANEOUS
  Administered 2011-09-16: 8 [IU] via SUBCUTANEOUS

## 2011-09-12 MED ORDER — FUROSEMIDE 20 MG PO TABS
20.0000 mg | ORAL_TABLET | Freq: Every day | ORAL | Status: DC
  Administered 2011-09-12 – 2011-09-13 (×2): 20 mg via ORAL
  Filled 2011-09-12 (×4): qty 1

## 2011-09-12 MED ORDER — ASPIRIN EC 81 MG PO TBEC: 81.0000 mg | DELAYED_RELEASE_TABLET | Freq: Every day | ORAL | Status: DC

## 2011-09-12 MED ORDER — POTASSIUM CHLORIDE CRYS ER 20 MEQ PO TBCR
20.0000 meq | EXTENDED_RELEASE_TABLET | Freq: Every day | ORAL | Status: DC
  Administered 2011-09-13 – 2011-09-16 (×4): 20 meq via ORAL
  Filled 2011-09-12 (×5): qty 1

## 2011-09-12 MED ORDER — ATORVASTATIN CALCIUM 40 MG PO TABS: 40.0000 mg | ORAL_TABLET | Freq: Every day | ORAL | Status: DC

## 2011-09-12 MED ORDER — ASPIRIN EC 325 MG PO TBEC
325.0000 mg | DELAYED_RELEASE_TABLET | Freq: Every day | ORAL | Status: DC
  Administered 2011-09-12 – 2011-09-20 (×9): 325 mg via ORAL
  Filled 2011-09-12 (×9): qty 1

## 2011-09-12 MED ORDER — POTASSIUM CHLORIDE CRYS ER 20 MEQ PO TBCR
40.0000 meq | EXTENDED_RELEASE_TABLET | Freq: Once | ORAL | Status: AC
  Administered 2011-09-12: 40 meq via ORAL
  Filled 2011-09-12: qty 2

## 2011-09-12 NOTE — Progress Notes (Addendum)
   CARDIOTHORACIC SURGERY PROGRESS NOTE R6 Days Post-Op Procedure(s) (LRB):  AORTIC VALVE REPLACEMENT (AVR) (N/A)  CORONARY ARTERY BYPASS GRAFTING (CABG) (N/A)   R1 Day Post-Op Procedure(s) (LRB): PERMANENT PACEMAKER INSERTION (N/A)  Subjective: Feels okay.  Limited motivation.  No specific complaints.  Objective: Vital signs: BP Readings from Last 1 Encounters:  09/12/11 148/64   Pulse Readings from Last 1 Encounters:  09/12/11 92   Resp Readings from Last 1 Encounters:  09/12/11 23   Temp Readings from Last 1 Encounters:  09/12/11 97.4 F (36.3 C) Oral    Hemodynamics:    Physical Exam:  Rhythm:   Now in NSR  Breath sounds: Diminished at bases  Heart sounds:  RRR  Incisions:  Dressings dry  Abdomen:  soft  Extremities:  warm   Intake/Output from previous day: 06/24 0701 - 06/25 0700 In: [id number] [P.O.:507; I.V.:250; IV Piggyback:100] Out: 1950 [id number] Intake/Output this shift:    Lab Results:  Basename 09/12/11 0420 09/11/11 0425  WBC 9.9 12.9*  HGB 13.4 13.3  HCT 40.6 39.7  PLT 125* 111*   BMET:  Basename 09/12/11 0420 09/11/11 0425  NA 137 138  K 3.7 3.8  CL 99 99  CO2 26 29  GLUCOSE 95 107*  BUN 33* 32*  CREATININE 1.09 1.18*  CALCIUM 8.3* 8.4    CBG (last 3)   Basename 09/12/11 0802 09/12/11 0339 09/11/11 2344  GLUCAP 97 84 123*   ABG    Component Value Date/Time   PHART 7.291* 09/07/2011 1158   HCO3 22.8 09/07/2011 1158   TCO2 19 09/07/2011 1653   ACIDBASEDEF 4.0* 09/07/2011 1158   O2SAT 99.0 09/07/2011 1158   CXR: *RADIOLOGY REPORT*  Clinical Data: Post CABG and placement of pacemaker.  CHEST - 2 VIEW  Comparison: 11/18/202013  Findings: Two views chest demonstrate a left cardiac pacemaker.  Cardiac leads in the region of the right atrium and right  ventricle. There are bilateral pleural effusions. The pleural  fluid may be slightly complex based on the lateral view. The right  jugular central line has been removed. There is  no evidence for a  pneumothorax. Linear left lung atelectasis. The patient has an  aortic valve replacement.  IMPRESSION:  Small to moderate sized bilateral pleural effusions.  Placement of left cardiac pacemaker.  Original Report Authenticated By: Markus Daft, M.D.   Assessment/Plan:  Overall stable POD6 S/P perm pacer yesterday for CHB Some episodes PAF in EP lab yesterday, none since Now in NSR Hypertension under adequate control Expected post op volume excess, mild, diuresing Type II diabetes mellitus, excellent glycemic control Postop atelectasis and small bilateral effusions Limited mobility and frail preoperatively Protein-depleted malnourishment with marginal po intake   Mobilize  Diuresis  Transfer 2000  Continue CBG's and SSI  Restart oral agent for DMII as CBG's and po intake improve  Continue amlodipine and metoprolol  Low dose lovenox for DVT prophylaxis  Probably not a good candidate for coumadin  I favor holding off on amiodarone unless Afib recurs  Talyn Eddie H 09/12/2011 8:24 AM

## 2011-09-12 NOTE — Progress Notes (Signed)
Patient referred to this CSW today after she was transferred to unit 2000. Per report, patient is considering SNF however PT/OT's recommendations are for home health. Will fax info to [facility] for SNF auth consideration but uncertain if this will be approved- patient may have to pay out of pocket for SNF or d/c home with [facility] and DME. Will discuss further with RNCM and advise patient and family- Eduard Clos, MSW, [place]

## 2011-09-12 NOTE — Progress Notes (Signed)
Pt transferred to 2012, vss, pt complaining of no pain, pt ambulated the whole distance to her new room, pt tolerated ambulation well, all pts belongings were packed and sent with pt.

## 2011-09-12 NOTE — Progress Notes (Signed)
Nutrition Follow-up  Patient sleeping upon RD visitation. Per RN, eating well. PO intake at 50% per flowsheet records. Glucerna Shake ordered per RD 3 times daily 6/21. Noted possible pacemaker tomorrow.  Diet Order:  Carbohydrate Modified Medium Calorie  Meds: Scheduled Meds:   . acetaminophen  1,000 mg Oral Q6H  . amLODipine  10 mg Oral Daily  . aspirin  325 mg Oral Daily  . atorvastatin  40 mg Oral q1800  . bisacodyl  10 mg Oral Daily   Or  . bisacodyl  10 mg Rectal Daily  . ceFAZolin      .  ceFAZolin (ANCEF) IV  1 g Intravenous Q6H  . Chlorhexidine Gluconate Cloth  6 each Topical Q0600  . docusate sodium  200 mg Oral Daily  . enoxaparin (LOVENOX) injection  30 mg Subcutaneous Q24H  . feeding supplement  237 mL Oral TID BM  . furosemide  20 mg Oral Daily  . insulin aspart  0-24 Units Subcutaneous Q4H  . levothyroxine  50 mcg Oral QAC breakfast  . metoprolol tartrate  25 mg Oral BID  . mupirocin ointment   Nasal BID  . pantoprazole  40 mg Oral Q1200  . potassium chloride  20 mEq Oral Daily  . potassium chloride  40 mEq Oral Once  . DISCONTD: amiodarone  400 mg Oral BID  . DISCONTD: amiodarone  400 mg Oral BID  . DISCONTD: aspirin EC  325 mg Oral Daily  . DISCONTD: aspirin EC  81 mg Oral Daily  . DISCONTD: atorvastatin  40 mg Oral Daily  . DISCONTD:  ceFAZolin (ANCEF) IV  1 g Intravenous On Call  . DISCONTD: gentamicin irrigation  80 mg Irrigation On Call  . DISCONTD: insulin aspart  0-24 Units Subcutaneous Q4H  . DISCONTD: sodium chloride  3 mL Intravenous Q12H  . DISCONTD: sodium chloride  3 mL Intravenous Q12H   Continuous Infusions:   . sodium chloride 50 mL/hr at 09/11/11 1536  . DISCONTD: sodium chloride 20 mL/hr at 09/06/11 1647  . DISCONTD: sodium chloride    . DISCONTD: sodium chloride 20 mL/hr (09/08/11 2213)  . DISCONTD: sodium chloride    . DISCONTD: sodium chloride    . DISCONTD: lactated ringers Stopped (09/08/11 0850)   PRN Meds:.acetaminophen,  ondansetron (ZOFRAN) IV, phenol, sodium chloride, DISCONTD: metoprolol, DISCONTD:  morphine injection, DISCONTD: ondansetron (ZOFRAN) IV, DISCONTD: potassium chloride, DISCONTD: sodium chloride  Labs:  CMP     Component Value Date/Time   NA 137 09/12/2011 0420   K 3.7 09/12/2011 0420   CL 99 09/12/2011 0420   CO2 26 09/12/2011 0420   GLUCOSE 95 09/12/2011 0420   BUN 33* 09/12/2011 0420   CREATININE 1.09 09/12/2011 0420   CALCIUM 8.3* 09/12/2011 0420   PROT 7.4 09/04/2011 1337   ALBUMIN 3.5 09/04/2011 1337   AST 20 09/04/2011 1337   ALT 11 09/04/2011 1337   ALKPHOS 107 09/04/2011 1337   BILITOT 0.3 09/04/2011 1337   GFRNONAA 48* 09/12/2011 0420   GFRAA 56* 09/12/2011 0420     Intake/Output Summary (Last 24 hours) at 09/12/11 1237 Last data filed at 09/12/11 1046  Gross per 24 hour  Intake   1177 ml  Output   1350 ml  Net   -173 ml    CBG (last 3)   Basename 09/12/11 0802 09/12/11 0339 09/11/11 2344  GLUCAP 97 84 123*    Weight Status:  55 kg (6/25) -- trending down likely given diuresis   Re-estimated needs:  1200-1400 kcals, 65-75 gm protein  Nutrition Dx:  Inadequate Oral Intake, improving  Goal:  Oral intake with diet & supplements to meet >90% of estimated nutrition needs, progressing Monitor: PO intake, weight, labs, I/O's  Intervention:   Continue Glucerna Shake 3 times daily to maximize kcal, protein intake  RD to follow for nutrition care plan  Lady Deutscher Pager #:  [phone]

## 2011-09-12 NOTE — Progress Notes (Signed)
OT Cancellation Note  Treatment cancelled today due to witnessed pt ambulate from 2300 to 2000. Pt just back to bed. Just finished working with cardiac rehab. Will check back another time.  Sayge Salvato A OTR/L [id number] 09/12/2011, 3:08 PM

## 2011-09-12 NOTE — Progress Notes (Signed)
Physical Therapy Cancellation Note: Witnessed pt ambulate from 2300 to 2000 at noon and deferred therapy to later in pm. On current arrival to see pt cardiac rehab present and stating they were beginning to work with pt. Will defer to later date. Thanks Elwyn Reach, [place]

## 2011-09-12 NOTE — Progress Notes (Signed)
     Patient: Barbara Garner Date of Encounter: 09/12/2011, 8:16 AM Admit date: 09/06/2011     Subjective  Barbara Garner has no complaints this AM. She denies chest pain, shortness of breath, palpitations or dizziness.    Objective  Physical Exam: Vitals: BP 148/64  Pulse 92  Temp 97.4 F (36.3 C) (Oral)  Resp 23  Ht 5\' 1"  (1.549 m)  Wt 121 lb 4.1 oz (55 kg)  BMI 22.91 kg/m2  SpO2 94% General: Well developed, well appearing 77 year old female in no acute distress. Head: Normocephalic, atraumatic, sclera non-icteric, no xanthomas, nares are without discharge.  Neck: Supple. JVD not elevated. Lungs: Clear bilaterally to auscultation without wheezes, rales, or rhonchi. Breathing is unlabored. Heart: RRR S1 S2 without murmurs, rubs, or gallops.  Abdomen: Soft, non-distended. Extremities: No clubbing or cyanosis. No edema.  Distal pedal pulses are 2+ and equal bilaterally. Neuro: Alert and oriented X 3. Moves all extremities spontaneously. No focal deficits. Skin: Left upper chest/implant site intact without significant bleeding or hematoma.  Intake/Output Summary (Last 24 hours) at 09/12/11 0816 Last data filed at 09/12/11 0600  Gross per 24 hour  Intake    857 ml  Output   1350 ml  Net   -493 ml   Inpatient Medications:  . acetaminophen  1,000 mg Oral Q6H  . amiodarone  400 mg Oral BID  . amLODipine  10 mg Oral Daily  . aspirin EC  325 mg Oral Daily  . atorvastatin  40 mg Oral q1800  . bisacodyl  10 mg Oral Daily  . bisacodyl  10 mg Rectal Daily  .  ceFAZolin (ANCEF) IV  1 g Intravenous Q6H  . Chlorhexidine Gluconate Cloth  6 each Topical Q0600  . docusate sodium  200 mg Oral Daily  . enoxaparin (LOVENOX) injection  30 mg Subcutaneous Q24H  . feeding supplement  237 mL Oral TID BM  . insulin aspart  0-24 Units Subcutaneous Q4H  . levothyroxine  50 mcg Oral QAC breakfast  . metoprolol tartrate  25 mg Oral BID  . mupirocin ointment   Nasal BID  . pantoprazole  40 mg Oral  Q1200   Labs: [facility] 09/12/11 0420 09/11/11 0425  NA 137 138  K 3.7 3.8  CL 99 99  CO2 26 29  GLUCOSE 95 107*  BUN 33* 32*  CREATININE 1.09 1.18*  CALCIUM 8.3* 8.4  MG -- --  PHOS -- --   Basename 09/12/11 0420 09/11/11 0425  WBC 9.9 12.9*  NEUTROABS -- --  HGB 13.4 13.3  HCT 40.6 39.7  MCV 91.6 91.1  PLT 125* 111*   Radiology/Studies: CXR shows stable lead placement without pneumothorax Telemetry: normal sinus rhythm with intermittent sinus tachycardia Device interrogation: normal PPM function with stable lead parameters/measurements   Assessment and Plan  1. Complete heart block s/p dual chamber PPM implantation yesterday; device function is normal; CXR shows stable lead placement without pneumothorax 2. PAF - patient started on amiodarone post-op; continue beta blocker; will need chronic anticoagulation with warfarin but will defer initiation timing to Dr. Roxy Manns given her recent surgery 3. s/p CABG and AVR   Dr. Caryl Comes to see and make further recommendations as needed. Signed, EDMISTEN, Hunt Oris  Spoke with Dr CO  He would like to hold off on amio and coumadin  We will follow by way of her device  otehrwise better  Virl Axe, MD 09/12/2011 9:15 AM

## 2011-09-12 NOTE — Progress Notes (Signed)
CARDIAC REHAB PHASE I   PRE:  Rate/Rhythm: 84Paced  BP:  Supine:   Sitting: 138/70  Standing:    SaO2: 95%RA  MODE:  Ambulation: 148 ft   POST:  Rate/Rhythem: 89  BP:  Supine: 160/80  Sitting:   Standing:    SaO2: 91%RA 1425-1457 Pt walked 148 ft on RA with handheld asst x2 and gait belt. Pt very tired and only able to walk short distance. Wanted to go to bed. Assisted to bed. Call bell in reach. Will see along with PT.  [name]

## 2011-09-13 ENCOUNTER — Encounter: Payer: Self-pay | Admitting: *Deleted

## 2011-09-13 DIAGNOSIS — Z950 Presence of cardiac pacemaker: Secondary | ICD-10-CM | POA: Insufficient documentation

## 2011-09-13 LAB — GLUCOSE, CAPILLARY
Glucose-Capillary: 138 mg/dL — ABNORMAL HIGH (ref 70–99)
Glucose-Capillary: 143 mg/dL — ABNORMAL HIGH (ref 70–99)
Glucose-Capillary: 157 mg/dL — ABNORMAL HIGH (ref 70–99)
Glucose-Capillary: 175 mg/dL — ABNORMAL HIGH (ref 70–99)

## 2011-09-13 MED ORDER — CARVEDILOL 6.25 MG PO TABS
6.2500 mg | ORAL_TABLET | Freq: Two times a day (BID) | ORAL | Status: DC
  Administered 2011-09-13 (×2): 6.25 mg via ORAL
  Filled 2011-09-13 (×5): qty 1

## 2011-09-13 MED ORDER — POTASSIUM CHLORIDE CRYS ER 20 MEQ PO TBCR: 20.0000 meq | EXTENDED_RELEASE_TABLET | Freq: Every day | ORAL | Status: DC

## 2011-09-13 MED ORDER — CARVEDILOL 6.25 MG PO TABS: 6.2500 mg | ORAL_TABLET | Freq: Two times a day (BID) | ORAL | Status: DC

## 2011-09-13 NOTE — Discharge Summary (Signed)
I agree with the above discharge summary and plan for follow-up.  However, final discharge medication list remains to be determined.  Barbara Garner H

## 2011-09-13 NOTE — Progress Notes (Signed)
Rec'd message from [facility] Rep asking when patient would be medically ready for SNF transfer- anticipate tomorrow per PA-C- message left for insurance rep- will f/u tomorrow for final d/c planning- Eduard Clos, MSW, [place]

## 2011-09-13 NOTE — Progress Notes (Signed)
CARDIAC REHAB PHASE I   PRE:  Rate/Rhythm: 90Paced   BP:  Supine: 160/80  Sitting:   Standing:    SaO2: 95%RA  MODE:  Ambulation: 510 ft   POST:  Rate/Rhythem: 98  BP:  Supine: 158/70  Sitting:   Standing:    SaO2: 94%RA 1119-1145 Pt walked 510 ft using rollator. Did not put pressure on arms. Tolerated well. Asst x 1. Requested back to bed. No complaints.  [name]

## 2011-09-13 NOTE — Discharge Summary (Addendum)
Physician Discharge Summary  Patient ID: Barbara Garner MRN: [id number] DOB/AGE: 75-Apr-1936 75 y.o.  Admit date: 09/06/2011 Discharge date: 09/19/2011  Admission Diagnoses:  Patient Active Problem List  Diagnosis  . HYPOTHYROIDISM  . DIABETES MELLITUS, TYPE II  . HYPERCHOLESTEROLEMIA, PURE  . ANEMIA-IRON DEFICIENCY  . PERIPHERAL NEUROPATHY  . HYPERTENSION  . MYOCARDIAL INFARCTION, HX OF  . CORONARY ARTERY DISEASE  . CONGESTIVE HEART FAILURE  . ASTHMA  . GERD  . RENAL FAILURE, ACUTE W/TUBULAR NECROSIS LSN  . MEMORY LOSS  . HEADACHE  . PNEUMONIA, HX OF  . INTERTRIGO, CANDIDAL  . ECZEMA  . Neoplasm of uncertain behavior of skin  . Squamous cell carcinoma of skin  . Vaginitis  . CKD (chronic kidney disease)  . Carotid stenosis  . Severe aortic stenosis  . LBBB (left bundle branch block)  . CKD (chronic kidney disease) stage 4, GFR 15-29 ml/min  . CAD (coronary artery disease)  . Coronary atherosclerosis of native coronary artery  . Aortic stenosis   Discharge Diagnoses:   Patient Active Problem List  Diagnosis  . HYPOTHYROIDISM  . DIABETES MELLITUS, TYPE II  . HYPERCHOLESTEROLEMIA, PURE  . ANEMIA-IRON DEFICIENCY  . PERIPHERAL NEUROPATHY  . HYPERTENSION  . MYOCARDIAL INFARCTION, HX OF  . CORONARY ARTERY DISEASE  . CONGESTIVE HEART FAILURE  . ASTHMA  . GERD  . RENAL FAILURE, ACUTE W/TUBULAR NECROSIS LSN  . MEMORY LOSS  . HEADACHE  . PNEUMONIA, HX OF  . INTERTRIGO, CANDIDAL  . ECZEMA  . Neoplasm of uncertain behavior of skin  . Squamous cell carcinoma of skin  . Vaginitis  . CKD (chronic kidney disease)  . Carotid stenosis  . Severe aortic stenosis  . LBBB (left bundle branch block)  . CKD (chronic kidney disease) stage 4, GFR 15-29 ml/min  . CAD (coronary artery disease)  . Coronary atherosclerosis of native coronary artery  . Aortic stenosis  . S/P aortic valve replacement  . S/P CABG x 3  . Complete heart block  . Atrial fibrillation  .  Pacemaker-[employer]    Discharged Condition: good  History of Present Illness:   Barbara Garner is a 77 yo female with known history of Aortic Stenosis and CAD.  The patient is S/P PTCA in 1997 for unstable angina involving high grade stenosis of the large diagonal branch.  She had done well since then.  The patient had developed progressive aortic stenosis.  Over the past few years the patient has been experiencing progressive exertional dyspnea and fatigue.  In most recent months these symptoms occur with minimal activity and is now being associated with some chest tightness.  She has been routinely followed by Dr. Aundra Dubin who felt based on her current symptoms and history she should undergo repeat echocardiogram and cardiac catheterization.  Echocardiogram performed in March revealed severe aortic stenosis.  Cardiac catheterization was performed  On 08/11/2011 which revealed 3 vessel CAD.  Due to these findings the patient was referred to [facility] for possible AVR/Coronary bypass procedure.  The patient was evaluated by Dr. Roxy Manns on 08/21/2011 at which time Dr. Roxy Manns felt the patient would be high risk candidate for coronary bypass and aortic valve replacement.  However, due to her underlying coronary disease she would not be a candidate for a TAVR and with only medical therapy the patient's long term prognosis would be poor.  Dr. Roxy Manns spoke at length with the patient and her son regarding the risks and benefits of proceeding with surgery.  The patient  was agreeable to the risks and wished to proceed with surgery.  She underwent further preoperative workup and the patient was scheduled for surgery on 09/06/2011.  Hospital Course:   Barbara Garner presented to [facility] on 09/06/2011.  She was taken to the operating room and underwent CABG x3 utilizing LIMA to Diagonal 1, SVG to OM, and SVG to PDA.  She underwent bilateral endoscopic vein harvest of her right and left thigh.  She also underwent Aortic Valve  Replacement utilizing a 40mm Edwards Engineer, petroleum Pericardial Tissue Valve.  The patient tolerated the procedure well and was taken to the SICU in stable condition.  POD #0 the patients PA pressures were low.  She was started on Dopamine. POD #1 the patient was extubated.  The patient's NTG drip was weaned off.   POD #2 patient requiring AV pacing with heart block as underlying rhythm.  POD #3 the patients chest tube and arterial lines were removed.  Patient continued to have heart block requiring AV pacing.  Electrophysiology consult was obtained and they recommended patient undergo PPM placement.  POD #5 the patient underwent PPM placement for complete heart block. Patient doing well otherwise and was medically stable for transfer to telemetry unit.  POD #6 the patient had episodic Atrial Fibrillation yesterday in EP lab.  Has been maintaining NSR since conversion.  POD #7 The patient is doing well.  She is maintaining NSR. POD #8 the patient did not feel well.  She complained of dyspnea and appeared tachypneic with room air saturations of 93%.  Chest xray did show bilateral pleural effusions.  Arterial blood gas was obtained and was within normal limits.  Urinalysis was obtained and was mildly abnormal resulting in performance of urine culture.  Blood cultures were also sent.  Echocardiogram was obtained and did not show any evidence of tamponade and LV function was normal.  POD #9 patient continued to feel poor with flat affect.  She was placed on IV diuresis for bilateral pleural effusions and volume overload.  POD #10 patients urine culture was positive for Citrobacter Freundii and she was started on Levaquin per sensitivity results.  Her blood cultures remain negative.  POD #12 the patient is feeling better.  She states her shortness of breath has improved.  She is participating with PT/OT, but requires a lot of encouragement.  The patient is progressing and will be ready for discharge to a SNF in the next 24  hours pending no acute issues arise.  She will follow up with Dr. Roxy Manns on 10/09/2011 at 4:30pm.  The patient will need to have a chest xray performed prior to her appointment with Dr. Roxy Manns.  The patient will also need to follow up with [facility] for a wound check on 09/27/2011 at 12:15pm.   Consults: Electrophysiology  Significant Diagnostic Studies:   Cardiac Catheterization  Hemodynamics (mmHg)  RA mean 6  RV 29/8  PA 30/11  PCWP mean 16  AO 147/58  Oxygen saturations:  PA 59%  AO 91%  Cardiac Output (Fick) 4.52 L/min  Cardiac Index (Fick) 3.12  Coronary angiography:  Coronary dominance: right  Left mainstem: 40% distal LM stenosis.  Left anterior descending (LAD): Large D1 with 80% ostial stenosis. Diffuse moderate disease in the mid LAD reaching 75% stenosis.  Left circumflex (LCx): The OM1 was moderate with 95% proximal stenosis. After OM1 takeoff, the AV LCx was relatively small with 90% mid vessel stenosis.  Right coronary artery (RCA): 30% proximal RCA stenosis,  30% mid RCA stenosis, 75% distal RCA stenosis just before bifurcation. The PDA is small and severely disease, up to 80% stenosis, in the proximal segment. The PLV is larger with about 30-40% mid vessel stenosis.  Left ventriculography: Left ventricular systolic function is normal, LVEF is estimated at 55-65%, there is no significant mitral regurgitation   Echocardiogram  - Left ventricle: The cavity size was normal. Wall thickness was increased in a pattern of moderate LVH. Systolic function was normal. The estimated ejection fraction was in the range of 55% to 65%. Wall motion was normal; there were no regional wall motion abnormalities. Doppler parameters are consistent with abnormal left ventricular relaxation (grade 1 diastolic dysfunction). - Aortic valve: There was severe stenosis. Mild regurgitation. - Mitral valve: Calcified annulus. The findings are consistent with mild stenosis. Mild  regurgitation. - Left atrium: The atrium was mildly dilated. - Atrial septum: No defect or patent foramen ovale was identified. - Pulmonary arteries: PA peak pressure: 40mm Hg (S).   Treatments: surgery:   Aortic Valve Replacement  Edwards Magna Ease Pericardial Tissue Valve (size 78mm, model # 3300TFX, serial # [id number]) Coronary Artery Bypass Grafting x 3  Left Internal Mammary Artery to Diagonal Branch Coronary Artery  Saphenous Vein Graft to Posterior Descending Coronary Artery  Saphenous Vein Graft to First Obtuse Marginal Branch of Left Circumflex Coronary Artery  Endoscopic Vein Harvest from Bilateral Thighs  Procedure dual chamber pacemaker implantation  Disposition: SNF  Discharge Orders    Future Appointments: Provider: Department: Dept Phone: Center:   09/27/2011 12:15 PM [facility] [place] [id number] [facility]   10/09/2011 4:30 PM Rexene Alberts, MD [facility] [place] [phone] [facility]   12/05/2011 12:15 PM Deboraha Sprang, [place] [facility]   07/12/2012 12:30 PM Hayden Pedro, MD [facility] [phone] None     Medication List  As of 09/19/2011 10:21 AM   STOP taking these medications         isosorbide mononitrate 60 MG 24 hr tablet      metoprolol 50 MG tablet      nitroGLYCERIN 0.4 MG SL tablet         TAKE these medications         albuterol 108 (90 BASE) MCG/ACT inhaler   Commonly known as: PROVENTIL HFA;VENTOLIN HFA   Inhale 2 puffs into the lungs every 4 (four) hours as needed. For shortness of breath.      amLODipine 10 MG tablet   Commonly known as: NORVASC   Take 10 mg by mouth daily.      aspirin 325 MG EC tablet   Take 325 mg by mouth daily.      atorvastatin 40 MG tablet   Commonly known as: LIPITOR   Take 40 mg by mouth daily.      carvedilol 12.5 MG tablet   Commonly known as: COREG   Take 1 tablet (12.5 mg total) by mouth 2 (two) times daily with a meal.      furosemide  20 MG tablet   Commonly known as: LASIX   Take 20 mg by mouth daily.      glipiZIDE 10 MG 24 hr tablet   Commonly known as: GLUCOTROL XL   Take 10 mg by mouth 2 (two) times daily.      levofloxacin 250 MG tablet   Commonly known as: LEVAQUIN   Take 1 tablet (250 mg total) by mouth daily.      levothyroxine  50 MCG tablet   Commonly known as: SYNTHROID, LEVOTHROID   Take 50 mcg by mouth daily.      omeprazole 20 MG capsule   Commonly known as: PRILOSEC   Take 20 mg by mouth daily.      potassium chloride SA 20 MEQ tablet   Commonly known as: K-DUR,KLOR-CON   Take 1 tablet (20 mEq total) by mouth daily. Should be taking daily if on Lasix           Follow-up Information    Follow up with [facility] on 09/27/2011. (At 12:15 PM for wound check)    Contact information:   [facility] [place] [phone]      Follow up with Virl Axe, MD on 12/05/2011. (At 12:15 PM)    Contact information:   [place] [phone]          Signed: Ellwood Handler 09/19/2011, 10:21 AM

## 2011-09-13 NOTE — Progress Notes (Addendum)
2 Days Post-Op Procedure(s) (LRB): PERMANENT PACEMAKER INSERTION (N/A) Subjective: Barbara Garner has no complaints this morning.  She is ambulating with minimal difficulty.    Objective: Vital signs in last 24 hours: Temp:  [96.1 F (35.6 C)-97.8 F (36.6 C)] 97.4 F (36.3 C) (06/26 0427) Pulse Rate:  [78-97] 87  (06/26 0427) Cardiac Rhythm:  [-] Normal sinus rhythm (06/25 2000) Resp:  [18-24] 20  (06/26 0427) BP: (137-168)/(58-94) 165/78 mmHg (06/26 0427) SpO2:  [91 %-96 %] 91 % (06/26 0427) Weight:  [119 lb 0.8 oz (54 kg)] 119 lb 0.8 oz (54 kg) (06/26 0427)  Intake/Output from previous day: 06/25 0701 - 06/26 0700 In: 570 [P.O.:520; IV Piggyback:50] Out: 400 [Urine:400]  General appearance: alert, cooperative and no distress Heart: regular rate and rhythm Lungs: clear to auscultation bilaterally Abdomen: soft, non-tender; bowel sounds normal; no masses,  no organomegaly Extremities: edema trace Wound: clean and dry  Lab Results:  Basename 09/12/11 0420 09/11/11 0425  WBC 9.9 12.9*  HGB 13.4 13.3  HCT 40.6 39.7  PLT 125* 111*   BMET:  Basename 09/12/11 0420 09/11/11 0425  NA 137 138  K 3.7 3.8  CL 99 99  CO2 26 29  GLUCOSE 95 107*  BUN 33* 32*  CREATININE 1.09 1.18*  CALCIUM 8.3* 8.4    PT/INR: No results found for this basename: LABPROT,INR in the last 72 hours ABG    Component Value Date/Time   PHART 7.291* 09/07/2011 1158   HCO3 22.8 09/07/2011 1158   TCO2 19 09/07/2011 1653   ACIDBASEDEF 4.0* 09/07/2011 1158   O2SAT 99.0 09/07/2011 1158   CBG (last 3)   Basename 09/13/11 0433 09/13/11 0024 09/12/11 2006  GLUCAP 137* 157* 89    Assessment/Plan: S/P Procedure(s) (LRB): PERMANENT PACEMAKER INSERTION (N/A)  1. CV- NSR, S/P PPM placement, rate in the 80s, patient hypertensive with SBPs in the 160s- patient may benefit from addition of another blood pressure medication 2. Pulm- no acute issues, continue IS 3. DM- CBGs controlled, occasionally elevated  glucose, will continue SSIP 4. Volume Overload- continue diuresis 5. Dispo- patient is stable, PT/OT recommend home health but patient requesting SNF placement.  Hopeful patient will be ready for d/c in 48 hours   LOS: 7 days    [name], [name] 09/13/2011   I have seen and examined the patient and agree with the assessment and plan as outlined.  Discussed management of HTN with Dr Aundra Dubin.  Will stop metoprolol and start carvedilol today and possibly add ACE-I or ARB later.  Overall Barbara Garner continues to progress slowly.  Her appetite remains marginal and she complains that she didn't sleep well last night.  Possibly ready for d/c to SNF in 2-3 days.  Shaletta Hinostroza H 09/13/2011 8:27 AM

## 2011-09-13 NOTE — Progress Notes (Signed)
Physical Therapy Treatment Patient Details Name: Barbara Garner MRN: [id number] DOB: July 17, 1934 Today's Date: 09/13/2011 Time: [id number] PT Time Calculation (min): 20 min  PT Assessment / Plan / Recommendation Comments on Treatment Session  Pt has progressed well but continues to be limited by fatigue with O2 sats to 90% on RA with ambulation and DOE 3/4, pt could not tolerate more than 200' ambulation this PM.  Due to decreased activity tolerance, recommend SNF at d/c for continued rehab before returning home.  PT will continue to follow.    Follow Up Recommendations  Skilled nursing facility;Supervision/Assistance - 24 hour    Barriers to Discharge        Equipment Recommendations  Defer to next venue    Recommendations for Other Services    Frequency Min 3X/week   Plan Discharge plan needs to be updated    Precautions / Restrictions Precautions Precautions: Sternal;Fall Restrictions Weight Bearing Restrictions: No   Pertinent Vitals/Pain 6/10 sternal pain O2 sats 90% on RA with ambulation and DOE 3/4 HR 88    Mobility  Bed Mobility Bed Mobility: Rolling Right;Right Sidelying to Sit;Sit to Sidelying Right Rolling Right: 6: Modified independent (Device/Increase time) Right Sidelying to Sit: 6: Modified independent (Device/Increase time);HOB flat;With rails Sitting - Scoot to Edge of Bed: 6: Modified independent (Device/Increase time) Sit to Sidelying Right: 6: Modified independent (Device/Increase time) Details for Bed Mobility Assistance: vc's to make bed mobility less painful Transfers Transfers: Sit to Stand;Stand to Sit Sit to Stand: 5: Supervision;From toilet;From bed Stand to Sit: 5: Supervision;To toilet;To bed Details for Transfer Assistance: vc's for hand placement to decrease pushing and keep sternal precautions Ambulation/Gait Ambulation/Gait Assistance: 5: Supervision Ambulation Distance (Feet): 200 Feet Assistive device: Rolling walker Ambulation/Gait  Assistance Details: pt has progressed well with ambulation but continues with fatigue.  After ambulating with cardiac rehab this AM, decreased activity tolerance in the PM and exhausted after ambulation. Gait Pattern: Step-through pattern;Decreased stride length Gait velocity: decreased Stairs: No Wheelchair Mobility Wheelchair Mobility: No         PT Diagnosis:    PT Problem List:   PT Treatment Interventions:     PT Goals Acute Rehab PT Goals PT Goal Formulation: With patient Time For Goal Achievement: 09/21/11 Potential to Achieve Goals: Good Pt will go Supine/Side to Sit: with modified independence PT Goal: Supine/Side to Sit - Progress: Progressing toward goal Pt will go Sit to Supine/Side: with modified independence PT Goal: Sit to Supine/Side - Progress: Progressing toward goal Pt will go Sit to Stand: with modified independence PT Goal: Sit to Stand - Progress: Progressing toward goal Pt will go Stand to Sit: with modified independence PT Goal: Stand to Sit - Progress: Progressing toward goal Pt will Ambulate: >150 feet;with least restrictive assistive device;with modified independence PT Goal: Ambulate - Progress: Progressing toward goal PT Goal: Up/Down Stairs - Progress: Discontinued (comment) Additional Goals Additional Goal #1: Pt will independently state and demonstrate adherence to all sternal precautions PT Goal: Additional Goal #1 - Progress: Progressing toward goal  Visit Information  Last PT Received On: 09/13/11 Assistance Needed: +1    Subjective Data  Subjective: I just don't have any energy Patient Stated Goal: return home   Cognition  Overall Cognitive Status: Appears within functional limits for tasks assessed/performed Arousal/Alertness: Awake/alert Orientation Level: Oriented X4 / Intact Behavior During Session: [facility] for tasks performed    Balance  Balance Balance Assessed: Yes Static Standing Balance Static Standing - Balance Support:  During functional activity;Bilateral  upper extremity supported Static Standing - Level of Assistance: 5: Stand by assistance  End of Session PT - End of Session Equipment Utilized During Treatment: Gait belt Activity Tolerance: Patient limited by fatigue Patient left: in bed;with call bell/phone within reach Nurse Communication: Mobility status   GP   Leighton Roach, [place]  [place], [place] 09/13/2011, 4:28 PM

## 2011-09-13 NOTE — Progress Notes (Signed)
Spoke with [employer] Rep and awaiting word on SNF auth for [employer]. Will advise- Eduard Clos, MSW, [place]

## 2011-09-13 NOTE — Progress Notes (Signed)
Patient: Barbara Garner Date of Encounter: 09/13/2011, 8:17 AM Admit date: 09/06/2011     Subjective  Barbara Garner reports fatigue. She states she had a rough night with "pain all over" and restless legs. She denies chest pain. She reports intermittent shortness of breath. She denies palpitations.   Objective  Physical Exam: Vitals: BP 165/78  Pulse 87  Temp 97.4 F (36.3 C) (Oral)  Resp 20  Ht 5\' 1"  (1.549 m)  Wt 119 lb 0.8 oz (54 kg)  BMI 22.49 kg/m2  SpO2 91% General: Well developed, elderly appearing 77 year old female in no acute distress. Neck: Supple. JVD not elevated. Lungs: Diminished breath sounds on left otherwise clear bilaterally to auscultation without wheezes, rales, or rhonchi. Breathing is unlabored. Heart: RRR. S1, S2 present. Soft II/VI systolic murmur along LSB. No rub, S3 or S4.  Abdomen: Soft, non-distended. Extremities: No clubbing or cyanosis. No edema.  Distal pedal pulses are 2+ and equal bilaterally. Neuro: Alert and oriented X 3. Moves all extremities spontaneously. No focal deficits.  Intake/Output: Intake/Output Summary (Last 24 hours) at 09/13/11 0817 Last data filed at 09/12/11 1700  Gross per 24 hour  Intake    520 ml  Output    400 ml  Net    120 ml   Inpatient Medications:  . amLODipine  10 mg Oral Daily  . aspirin  325 mg Oral Daily  . atorvastatin  40 mg Oral q1800  . bisacodyl  10 mg Oral Daily  . bisacodyl  10 mg Rectal Daily  . cefazolin (ANCEF) IV  1 g Intravenous Q6H  . chlorhexidine gluconate cloth  6 each Topical Q0600  . docusate sodium  200 mg Oral Daily  . enoxaparin (LOVENOX) injection  30 mg Subcutaneous Q24H  . feeding supplement  237 mL Oral TID BM  . furosemide  20 mg Oral Daily  . insulin aspart  0-24 Units Subcutaneous Q4H  . levothyroxine  50 mcg Oral QAC breakfast  . metoprolol tartrate  25 mg Oral BID  . mupirocin ointment   Nasal BID  . pantoprazole  40 mg Oral Q1200  . potassium chloride  20 mEq Oral Daily    . potassium chloride  40 mEq Oral Once   Labs: [facility] 09/12/11 0420 09/11/11 0425  NA 137 138  K 3.7 3.8  CL 99 99  CO2 26 29  GLUCOSE 95 107*  BUN 33* 32*  CREATININE 1.09 1.18*  CALCIUM 8.3* 8.4  MG -- --  PHOS -- --    Basename 09/12/11 0420 09/11/11 0425  WBC 9.9 12.9*  NEUTROABS -- --  HGB 13.4 13.3  HCT 40.6 39.7  MCV 91.6 91.1  PLT 125* 111*   Radiology/Studies: Dg Chest 2 View  09/12/2011  *RADIOLOGY REPORT*  Clinical Data: Post CABG and placement of pacemaker.  CHEST - 2 VIEW  Comparison: 03/30/2011  Findings: Two views chest demonstrate a left cardiac pacemaker. Cardiac leads in the region of the right atrium and right ventricle.  There are bilateral pleural effusions. The pleural fluid may be slightly complex based on the lateral view.  The right jugular central line has been removed.  There is no evidence for a pneumothorax.  Linear left lung atelectasis.  The patient has an aortic valve replacement.  IMPRESSION:  Small to moderate sized bilateral pleural effusions.  Placement of left cardiac pacemaker.  Original Report Authenticated By: Markus Daft, M.D.   Telemetry: normal sinus rhythm with occasional PACs,  PVCs   Assessment and Plan  1. Complete heart block s/p dual chamber PPM implantation 2020-01-2112; device function is normal  2. PAF - per Dr. Roxy Manns 3. s/p CABG and AVR   No new EP recommendations at this time. Will sign off. Please call with any further questions. Signed, Darby Fleeman PA-C

## 2011-09-14 DIAGNOSIS — I359 Nonrheumatic aortic valve disorder, unspecified: Principal | ICD-10-CM

## 2011-09-14 DIAGNOSIS — I2510 Atherosclerotic heart disease of native coronary artery without angina pectoris: Secondary | ICD-10-CM

## 2011-09-14 LAB — GLUCOSE, CAPILLARY
Glucose-Capillary: 163 mg/dL — ABNORMAL HIGH (ref 70–99)
Glucose-Capillary: 221 mg/dL — ABNORMAL HIGH (ref 70–99)
Glucose-Capillary: 131 mg/dL — ABNORMAL HIGH (ref 70–99)

## 2011-09-14 MED ORDER — FUROSEMIDE 10 MG/ML IJ SOLN
20.0000 mg | Freq: Once | INTRAMUSCULAR | Status: AC
  Administered 2011-09-14: 20 mg via INTRAVENOUS
  Filled 2011-09-14: qty 2

## 2011-09-14 MED ORDER — FUROSEMIDE 20 MG PO TABS
20.0000 mg | ORAL_TABLET | Freq: Two times a day (BID) | ORAL | Status: DC
  Administered 2011-09-14 – 2011-09-15 (×3): 20 mg via ORAL
  Filled 2011-09-14 (×6): qty 1

## 2011-09-14 MED ORDER — GLIPIZIDE ER 10 MG PO TB24
10.0000 mg | ORAL_TABLET | Freq: Two times a day (BID) | ORAL | Status: DC
  Administered 2011-09-14 – 2011-09-15 (×3): 10 mg via ORAL
  Filled 2011-09-14 (×8): qty 1

## 2011-09-14 MED ORDER — CARVEDILOL 12.5 MG PO TABS
12.5000 mg | ORAL_TABLET | Freq: Two times a day (BID) | ORAL | Status: DC
  Administered 2011-09-14 – 2011-09-20 (×13): 12.5 mg via ORAL
  Filled 2011-09-14 (×15): qty 1

## 2011-09-14 MED ORDER — LISINOPRIL 5 MG PO TABS
5.0000 mg | ORAL_TABLET | Freq: Every day | ORAL | Status: DC
  Administered 2011-09-14 – 2011-09-15 (×2): 5 mg via ORAL
  Filled 2011-09-14 (×3): qty 1

## 2011-09-14 NOTE — Progress Notes (Addendum)
3 Days Post-Op Procedure(s) (LRB): PERMANENT PACEMAKER INSERTION (N/A)  Subjective: Barbara Garner complains of soreness.  Patient is participating with PT who now recommends SNF  Objective: Vital signs in last 24 hours: Temp:  [97.5 F (36.4 C)-97.9 F (36.6 C)] 97.8 F (36.6 C) (06/27 0354) Pulse Rate:  [81-89] 89  (06/27 0354) Cardiac Rhythm:  [-] Normal sinus rhythm (06/26 1950) Resp:  [16-20] 16  (06/27 0354) BP: (138-164)/(65-72) 164/72 mmHg (06/27 0354) SpO2:  [92 %-96 %] 96 % (06/27 0354) Weight:  [116 lb 13.5 oz (53 kg)] 116 lb 13.5 oz (53 kg) (06/27 0354)   Intake/Output from previous day: 06/26 0701 - 06/27 0700 In: 120 [P.O.:120] Out: -     General appearance: alert, cooperative and no distress Neurologic: intact Heart: regular rate and rhythm Lungs: clear to auscultation bilaterally Abdomen: soft, non-tender; bowel sounds normal; no masses,  no organomegaly Extremities: edema trace, BLE ecchymotic in thigh from [facility] Wound: clean and dry  Lab Results:  [facility] 09/12/11 0420  WBC 9.9  HGB 13.4  HCT 40.6  PLT 125*   BMET:  Basename 09/12/11 0420  NA 137  K 3.7  CL 99  CO2 26  GLUCOSE 95  BUN 33*  CREATININE 1.09  CALCIUM 8.3*    PT/INR: No results found for this basename: LABPROT,INR in the last 72 hours ABG    Component Value Date/Time   PHART 7.291* 09/07/2011 1158   HCO3 22.8 09/07/2011 1158   TCO2 19 09/07/2011 1653   ACIDBASEDEF 4.0* 09/07/2011 1158   O2SAT 99.0 09/07/2011 1158   CBG (last 3)   Basename 09/14/11 0406 09/13/11 2334 09/13/11 2052  GLUCAP 163* 169* 108*    Assessment/Plan: S/P Procedure(s) (LRB): PERMANENT PACEMAKER INSERTION (N/A)  1.. CV- NSR, patient remains hypertensive, currently on Norvasc and Coreg 2. Pulm- no acute issues, continue IS 3. Deconditioning- PT now recommending SNF 4. CBGs- PO intake is marginal, will continue SSIP and restart home medications when appropriate 5. Dispo- patient remains hypertensive,  Coreg started yesterday will continue to monitor.  Patient slowly progressing will likely be ready for SNF tomorrow  LOS: 8 days    Garner, Barbara 09/14/2011   I have seen and examined the patient and agree with the assessment and plan as outlined.  Will increase Coreg to 12.5 mg bid but defer decision regarding starting ACE-I or ARB to Dr Ulla Gallo al.  Barbara Garner reports that she is eating better and her CBGs have trended up a little.  Will restart glipizide and watch CBGs carefully.  Weight is still 5 kg above baseline.  Will increase lasix.  Recheck CXR in am to f/u atelectasis and bilateral effusions.  Overall she may be ready for d/c to SNF in 1-2 days if she continues to improve.  Barbara Garner 09/14/2011 8:48 AM

## 2011-09-14 NOTE — Progress Notes (Signed)
SNF approved by [facility] for transfer today however patient is not medically stable for d/c today. Will update the insurance rep, family and SNF. CSW covering tomorrow to f/u to further progress d/c plans. Eduard Clos, MSW, [place]

## 2011-09-14 NOTE — Progress Notes (Signed)
CARDIAC REHAB PHASE I   PRE:  Rate/Rhythm: 88 SR  BP:  Supine:   Sitting: 132/60  Standing:    SaO2: 94 RA  MODE:  Ambulation: 310 ft   POST:  Rate/Rhythem: 89  BP:  Supine:   Sitting: 156/66  Standing:    SaO2: 92 RA 1105-1130 Assisted X 1 and used rollater to ambulate. Gait steady with walker. Pt tires easily, exhausted by end of walk. VS stable. Pt back to recliner after walk with call light in reach.  Deon Pilling

## 2011-09-14 NOTE — Progress Notes (Signed)
Patient ID: Barbara Garner, female   DOB: 24-Jan-1935, 77 y.o.   MRN: [id number]    SUBJECTIVE: Doing well in general.  Walking with PT.  Sore.  She is in sinus rhythm (no pacing) currently on telemetry.       Marland Kitchen amLODipine  10 mg Oral Daily  . aspirin  325 mg Oral Daily  . atorvastatin  40 mg Oral q1800  . bisacodyl  10 mg Oral Daily   Or  . bisacodyl  10 mg Rectal Daily  . carvedilol  12.5 mg Oral BID WC  . Chlorhexidine Gluconate Cloth  6 each Topical Q0600  . docusate sodium  200 mg Oral Daily  . enoxaparin (LOVENOX) injection  30 mg Subcutaneous Q24H  . feeding supplement  237 mL Oral TID BM  . furosemide  20 mg Intravenous Once  . furosemide  20 mg Oral BID  . glipiZIDE  10 mg Oral BID WC  . insulin aspart  0-24 Units Subcutaneous Q4H  . levothyroxine  50 mcg Oral QAC breakfast  . mupirocin ointment   Nasal BID  . pantoprazole  40 mg Oral Q1200  . potassium chloride  20 mEq Oral Daily  . DISCONTD: carvedilol  6.25 mg Oral BID WC  . DISCONTD: furosemide  20 mg Oral Daily      Filed Vitals:   09/13/11 1353 09/13/11 1610 09/13/11 2055 09/14/11 0354  BP: 138/71  147/65 164/72  Pulse: 87 88 81 89  Temp: 97.9 F (36.6 C)  97.5 F (36.4 C) 97.8 F (36.6 C)  TempSrc: Oral  Oral Oral  Resp: 19  20 16   Height:      Weight:    53 kg (116 lb 13.5 oz)  SpO2: 92%  92% 96%    Intake/Output Summary (Last 24 hours) at 09/14/11 1038 Last data filed at 09/14/11 0900  Gross per 24 hour  Intake    240 ml  Output      0 ml  Net    240 ml    LABS: Basic Metabolic Panel:  Basename 09/12/11 0420  NA 137  K 3.7  CL 99  CO2 26  GLUCOSE 95  BUN 33*  CREATININE 1.09  CALCIUM 8.3*  MG --  PHOS --   Liver Function Tests: No results found for this basename: AST:2,ALT:2,ALKPHOS:2,BILITOT:2,PROT:2,ALBUMIN:2 in the last 72 hours No results found for this basename: LIPASE:2,AMYLASE:2 in the last 72 hours CBC:  Basename 09/12/11 0420  WBC 9.9  NEUTROABS --  HGB 13.4  HCT  40.6  MCV 91.6  PLT 125*   RADIOLOGY: Dg Chest 2 View  09/12/2011  *RADIOLOGY REPORT*  Clinical Data: Post CABG and placement of pacemaker.  CHEST - 2 VIEW  Comparison: 12-19-2011  Findings: Two views chest demonstrate a left cardiac pacemaker. Cardiac leads in the region of the right atrium and right ventricle.  There are bilateral pleural effusions. The pleural fluid may be slightly complex based on the lateral view.  The right jugular central line has been removed.  There is no evidence for a pneumothorax.  Linear left lung atelectasis.  The patient has an aortic valve replacement.  IMPRESSION:  Small to moderate sized bilateral pleural effusions.  Placement of left cardiac pacemaker.  Original Report Authenticated By: Markus Daft, M.D.    PHYSICAL EXAM General: NAD, frail Neck: No JVD, no thyromegaly or thyroid nodule.  Lungs: Decreased breath sounds at bases bilaterally.  CV: Nondisplaced PMI.  Heart regular S1/S2, no S3/S4, 2/6  SEM RUSB.  No peripheral edema.  No carotid bruit.  Normal pedal pulses.  Abdomen: Soft, nontender, no hepatosplenomegaly, no distention.  Neurologic: Alert and oriented x 3.  Psych: Normal affect. Extremities: No clubbing or cyanosis.   TELEMETRY: Reviewed telemetry pt in NSR, no pacing currently.  Rarely has some V-pacing.   ASSESSMENT AND PLAN:  77 yo s/p CABG/bioprosthetic AVR complicated by post-op heart block and PCM placement.  1. CAD: s/p CABG.  Continue ASA, statin.  2. AS: s/p AVR.  3. Rhythm: Post-op heart block, had PCM.  Currently in NSR with no pacing.  4. CHF: Acute on chronic diastolic.  Suspect only mild volume overload, on po Lasix.  5. HTN: BP high.  On amlodipine, Coreg increased.  Would add low dose ACEI (lisinopril 5).  Careful with titration as her BUN is already elevated.  Will get BMET tomorrow.   Loralie Champagne 09/14/2011 10:42 AM

## 2011-09-15 ENCOUNTER — Inpatient Hospital Stay (HOSPITAL_COMMUNITY): Payer: Medicare Other

## 2011-09-15 DIAGNOSIS — I517 Cardiomegaly: Secondary | ICD-10-CM

## 2011-09-15 DIAGNOSIS — I4891 Unspecified atrial fibrillation: Secondary | ICD-10-CM

## 2011-09-15 LAB — BASIC METABOLIC PANEL
CO2: 28 mEq/L (ref 19–32)
Calcium: 8.4 mg/dL (ref 8.4–10.5)
Creatinine, Ser: 1.36 mg/dL — ABNORMAL HIGH (ref 0.50–1.10)
GFR calc Af Amer: 43 mL/min — ABNORMAL LOW (ref >90)
GFR calc non Af Amer: 37 mL/min — ABNORMAL LOW (ref >90)
Sodium: 137 mEq/L (ref 135–145)

## 2011-09-15 LAB — BLOOD GAS, ARTERIAL
Acid-Base Excess: 5.4 mmol/L — ABNORMAL HIGH (ref 0.0–2.0)
Drawn by: 296031
FIO2: 21 %
Patient temperature: 98.6
TCO2: 30.2 mmol/L (ref 0–100)
pCO2 arterial: 39.2 mmHg (ref 35.0–45.0)
pH, Arterial: 7.481 — ABNORMAL HIGH (ref 7.350–7.400)

## 2011-09-15 LAB — GLUCOSE, CAPILLARY
Glucose-Capillary: 139 mg/dL — ABNORMAL HIGH (ref 70–99)
Glucose-Capillary: 156 mg/dL — ABNORMAL HIGH (ref 70–99)
Glucose-Capillary: 334 mg/dL — ABNORMAL HIGH (ref 70–99)
Glucose-Capillary: 88 mg/dL (ref 70–99)

## 2011-09-15 LAB — URINALYSIS, MICROSCOPIC ONLY
Hgb urine dipstick: NEGATIVE
Ketones, ur: NEGATIVE mg/dL
Protein, ur: 30 mg/dL — AB
Urobilinogen, UA: 0.2 mg/dL (ref 0.0–1.0)

## 2011-09-15 LAB — AMYLASE: Amylase: 28 U/L (ref 0–105)

## 2011-09-15 LAB — CBC
HCT: 36.4 % (ref 36.0–46.0)
Hemoglobin: 12.2 g/dL (ref 12.0–15.0)
MCH: 31 pg (ref 26.0–34.0)
MCHC: 33.5 g/dL (ref 30.0–36.0)
RDW: 14.6 % (ref 11.5–15.5)

## 2011-09-15 MED ORDER — ACETYLCYSTEINE 20 % IN SOLN
4.0000 mL | Freq: Once | RESPIRATORY_TRACT | Status: AC
  Administered 2011-09-15: 4 mL via RESPIRATORY_TRACT
  Filled 2011-09-15: qty 4

## 2011-09-15 MED ORDER — FUROSEMIDE 10 MG/ML IJ SOLN
40.0000 mg | Freq: Once | INTRAMUSCULAR | Status: AC
  Administered 2011-09-15: 40 mg via INTRAVENOUS
  Filled 2011-09-15: qty 4

## 2011-09-15 MED ORDER — LEVALBUTEROL HCL 0.63 MG/3ML IN NEBU
0.6300 mg | INHALATION_SOLUTION | Freq: Three times a day (TID) | RESPIRATORY_TRACT | Status: DC
  Administered 2011-09-15 – 2011-09-17 (×9): 0.63 mg via RESPIRATORY_TRACT
  Filled 2011-09-15 (×13): qty 3

## 2011-09-15 NOTE — Progress Notes (Signed)
Patient became panicky during last breathing treatment with RT, states it "suffocates her" . Sat patient up in bed, lung sounds were slightlly "wet"  On the right. Pt states she feels better but is tachypnic. Family at bedside, would like update on pts condition, notified pa.

## 2011-09-15 NOTE — Progress Notes (Signed)
Physical Therapy Treatment Patient Details Name: Barbara Garner MRN: [id number] DOB: 10-Feb-1935 Today's Date: 09/15/2011 Time: 0940-1001 PT Time Calculation (min): 21 min  PT Assessment / Plan / Recommendation Comments on Treatment Session  Very limited session today because of fatigue. Noted plan to CT chest to r/o PE but RN reports this was cancelled. Per RN OK to try getting pt up if agreeable. Pt agreeable initially but very little mobility increased her fatigue so session was terminated once pt back to bed. Agree wtih SNF recommendation unless pt able to progress more.     Follow Up Recommendations  Skilled nursing facility    Barriers to Discharge        Equipment Recommendations  Defer to next venue    Recommendations for Other Services    Frequency     Plan Discharge plan remains appropriate;Frequency remains appropriate    Precautions / Restrictions Precautions Precautions: Sternal;Fall       Mobility  Bed Mobility Bed Mobility: Rolling Right;Right Sidelying to Sit;Sit to Sidelying Right Rolling Right: 4: Min assist Right Sidelying to Sit: HOB flat;4: Min guard Sit to Sidelying Right: 4: Min assist Details for Bed Mobility Assistance: minA to sequence rolling with decreased use of upper extremities for bed mobility, assist to elevate legs to bed, pt visibly fatigued with all mobility Transfers Transfers: Sit to Stand;Stand to Sit Sit to Stand: 4: Min guard;Without upper extremity assist;From bed Stand to Sit: To bed;Without upper extremity assist Details for Transfer Assistance: v/c's for safe hand placement Ambulation/Gait Ambulation/Gait Assistance: 4: Min guard Ambulation Distance (Feet): 2 Feet Assistive device: Rolling walker Ambulation/Gait Assistance Details: performed 2 side steps at the edge of bed as pt declining ambulation due to fatigue and SOB    Exercises General Exercises - Lower Extremity Ankle Circles/Pumps: AROM;10 reps;Supine;Both       PT  Goals Acute Rehab PT Goals PT Goal: Supine/Side to Sit - Progress: Progressing toward goal PT Goal: Sit to Supine/Side - Progress: Progressing toward goal PT Goal: Sit to Stand - Progress: Progressing toward goal PT Goal: Stand to Sit - Progress: Progressing toward goal PT Goal: Ambulate - Progress: Not progressing  Visit Information  Last PT Received On: 09/15/11 Assistance Needed: +1    Subjective Data  Subjective: I feel awful.    Cognition  Overall Cognitive Status: Appears within functional limits for tasks assessed/performed Area of Impairment: Attention Arousal/Alertness: Awake/alert Orientation Level: Appears intact for tasks assessed Behavior During Session: [facility] for tasks performed    Balance  Static Standing Balance Static Standing - Balance Support: Bilateral upper extremity supported Static Standing - Level of Assistance: 5: Stand by assistance  End of Session PT - End of Session Equipment Utilized During Treatment: Gait belt Activity Tolerance: Patient limited by fatigue Patient left: in bed;with call bell/phone within reach   GP     [place] 09/15/2011, 11:23 AM

## 2011-09-15 NOTE — Progress Notes (Signed)
Patient ID: Barbara Garner, female   DOB: 1935-02-26, 77 y.o.   MRN: [id number]     SUBJECTIVE: More short of breath today.  Does not feel well.  No chest pain.  BP lower.      Marland Kitchen acetylcysteine  4 mL Nebulization Once  . amLODipine  10 mg Oral Daily  . aspirin  325 mg Oral Daily  . atorvastatin  40 mg Oral q1800  . bisacodyl  10 mg Oral Daily   Or  . bisacodyl  10 mg Rectal Daily  . carvedilol  12.5 mg Oral BID WC  . docusate sodium  200 mg Oral Daily  . enoxaparin (LOVENOX) injection  30 mg Subcutaneous Q24H  . feeding supplement  237 mL Oral TID BM  . furosemide  40 mg Intravenous Once  . furosemide  20 mg Oral BID  . glipiZIDE  10 mg Oral BID WC  . insulin aspart  0-24 Units Subcutaneous Q4H  . levalbuterol  0.63 mg Nebulization TID  . levothyroxine  50 mcg Oral QAC breakfast  . lisinopril  5 mg Oral Daily  . mupirocin ointment   Nasal BID  . pantoprazole  40 mg Oral Q1200  . potassium chloride  20 mEq Oral Daily      Filed Vitals:   09/14/11 2023 09/15/11 0403 09/15/11 0929 09/15/11 0940  BP: 109/61 109/46    Pulse: 70 70    Temp: 97.7 F (36.5 C) 98.4 F (36.9 C)    TempSrc: Oral Oral    Resp: 16 16    Height:      Weight:  54.3 kg (119 lb 11.4 oz)    SpO2: 94% 94% 95% 94%    Intake/Output Summary (Last 24 hours) at 09/15/11 1348 Last data filed at 09/15/11 0700  Gross per 24 hour  Intake   1400 ml  Output      0 ml  Net   1400 ml    LABS: Basic Metabolic Panel:  Basename 09/15/11 0640  NA 137  K 3.8  CL 97  CO2 28  GLUCOSE 164*  BUN 42*  CREATININE 1.36*  CALCIUM 8.4  MG --  PHOS --   Liver Function Tests: No results found for this basename: AST:2,ALT:2,ALKPHOS:2,BILITOT:2,PROT:2,ALBUMIN:2 in the last 72 hours No results found for this basename: LIPASE:2,AMYLASE:2 in the last 72 hours CBC:  Basename 09/15/11 0830  WBC 9.9  NEUTROABS --  HGB 12.2  HCT 36.4  MCV 92.6  PLT 251    PHYSICAL EXAM General: NAD, frail Neck: No JVD, no  thyromegaly or thyroid nodule.  Lungs: Decreased breath sounds at bases bilaterally.  CV: Nondisplaced PMI.  Heart regular S1/S2, no S3/S4, 2/6 SEM RUSB.  1+ edema 1/2 to knee on right.  No carotid bruit.  Normal pedal pulses.  Abdomen: Soft, nontender, no hepatosplenomegaly, no distention.  Neurologic: Alert and oriented x 3.  Psych: Normal affect. Extremities: No clubbing or cyanosis.   ASSESSMENT AND PLAN:  77 yo s/p CABG/bioprosthetic AVR complicated by post-op heart block and PCM placement.  1. CAD: s/p CABG.  Continue ASA, statin.  2. AS: s/p AVR.  3. Rhythm: Post-op heart block, had PCM.  Currently in NSR with no pacing.  4. CHF: She is short of breath this morning but JVP not significantly increased on my exam so not sure more aggressive Lasix will help that much. She does appear to have moderate bilateral effusions on CXR, would consider perhaps US-guided thoracentesis.  5. HTN: BP  actually lower today.  With elevated BUN and creatinine, will stop lisinopril for now.   Loralie Champagne 09/15/2011 1:48 PM

## 2011-09-15 NOTE — Progress Notes (Signed)
Inpatient Diabetes Program Recommendations  AACE/ADA: New Consensus Statement on Inpatient Glycemic Control (2009)  Target Ranges:  Prepandial:   less than 140 mg/dL      Peak postprandial:   less than 180 mg/dL (1-2 hours)      Critically ill patients:  140 - 180 mg/dL   Reason for Visit: Hypoglycemia  Results for RILLEY, VECELLIO (MRN [id number]) as of 09/15/2011 12:50  Ref. Range 09/14/2011 16:45 09/14/2011 20:18 09/14/2011 22:27 09/15/2011 00:12 09/15/2011 03:57 09/15/2011 08:03  Glucose-Capillary Latest Range: 70-99 mg/dL 131 (H) 61 (L) 192 (H) 334 (H) 88 203 (H)    Inpatient Diabetes Program Recommendations Oral Agents: D/C glucotrol 10 mg bid while in hospital d/t hypoglycemia and variable food intake  Note: Will continue to follow.

## 2011-09-15 NOTE — Progress Notes (Signed)
1500 Pt only walked 2 ft with PT earlier.  Has been having SOB. Will hold this pm walk and followup. Barbara Weintraub DunlapRN

## 2011-09-15 NOTE — Progress Notes (Addendum)
4 Days Post-Op Procedure(s) (LRB): PERMANENT PACEMAKER INSERTION (N/A)  Subjective:  [name] doesn't feel well this morning.  She complains of shortness of breath.  She appears tachypneic and states its worse when she ambulates. Room Air Sats 93%  +BM  Objective: Vital signs in last 24 hours: Temp:  [97.7 F (36.5 C)-98.4 F (36.9 C)] 98.4 F (36.9 C) (06/28 0403) Pulse Rate:  [70-87] 70  (06/28 0403) Cardiac Rhythm:  [-] Normal sinus rhythm;Bundle branch block;Other (Comment) (06/28 0427) Resp:  [16-18] 16  (06/28 0403) BP: (109-132)/(46-64) 109/46 mmHg (06/28 0403) SpO2:  [94 %-97 %] 94 % (06/28 0403) Weight:  [119 lb 11.4 oz (54.3 kg)] 119 lb 11.4 oz (54.3 kg) (06/28 0403)  Intake/Output from previous day: 06/27 0701 - 06/28 0700 In: 1400 [P.O.:1200] Out: -   General appearance: alert, mild distress and appears ill Heart: regular rate and rhythm Lungs: diminished breath sounds bibasilar  Abdomen: soft, non-tender; bowel sounds normal; no masses,  no organomegaly Extremities: edema trace, ecchymotic bilateral thighs Wound: clean and dry  Lab Results: No results found for this basename: WBC:2,HGB:2,HCT:2,PLT:2 in the last 72 hours BMET: No results found for this basename: NA:2,K:2,CL:2,CO2:2,GLUCOSE:2,BUN:2,CREATININE:2,CALCIUM:2 in the last 72 hours  PT/INR: No results found for this basename: LABPROT,INR in the last 72 hours ABG    Component Value Date/Time   PHART 7.291* 09/07/2011 1158   HCO3 22.8 09/07/2011 1158   TCO2 19 09/07/2011 1653   ACIDBASEDEF 4.0* 09/07/2011 1158   O2SAT 99.0 09/07/2011 1158   CBG (last 3)   Basename 09/15/11 0357 09/15/11 0012 09/14/11 2227  GLUCAP 88 334* 192*    Assessment/Plan: S/P Procedure(s) (LRB): PERMANENT PACEMAKER INSERTION (N/A)  1. CV- NSR, rate and pressure under better control, started on Lisinopril yesterday 2. Pulm- bilateral pleural effusions/atelectasis, patient visibly tachypneic this morning, will get ABG, CT  scan chest to R/O PE, give a dose of IV Lasix, started nebulizer treatments 3. Renal- creatinine has been mildly elevated, will give dose of Mucomyst to help prevent significant rise in  Creatinine from dye load 4. CBGs- blood sugars out of control running from 80s-350s, restarted home glipizide yesterday with patient having better PO intake 5. Dispo- patient feels bad this morning. She looks ill, will get blood gas and procalcitonin level to assess for possible sepsis, urinalysis and culture will also be obtained, CT scan to r/o PE, will hold off on discharge planning until patient stablizes    LOS: 9 days    Garner, Barbara 09/15/2011  I examined the patient and reviewed her chest x-ray. Her pulmonary status appears to be adequate as reflected by her room air blood gas performed this morning. She does have some mild abdominal distention and increased bowel sounds without tenderness. Her white count and pro calcitonin are normal. Urinalysis is mildly abnormal but a culture is pending. Her 2-D echocardiogram shows normal LV systolic function without significant pericardial effusion and her aortic valve prosthesis is functioning well. She remains in a sinus rhythm.  We will assess her abdominal status with flat and upright of the abdomen and liver panel. She appears to be on a slow progress to SNF.

## 2011-09-16 ENCOUNTER — Inpatient Hospital Stay (HOSPITAL_COMMUNITY): Payer: Medicare Other

## 2011-09-16 LAB — HEPATIC FUNCTION PANEL
ALT: 19 U/L (ref 0–35)
Alkaline Phosphatase: 80 U/L (ref 39–117)
Bilirubin, Direct: 0.2 mg/dL (ref 0.0–0.3)
Indirect Bilirubin: 0.7 mg/dL (ref 0.3–0.9)
Total Bilirubin: 0.9 mg/dL (ref 0.3–1.2)
Total Protein: 5.9 g/dL — ABNORMAL LOW (ref 6.0–8.3)

## 2011-09-16 LAB — GLUCOSE, CAPILLARY
Glucose-Capillary: 29 mg/dL — CL (ref 70–99)
Glucose-Capillary: 43 mg/dL — CL (ref 70–99)
Glucose-Capillary: 240 mg/dL — ABNORMAL HIGH (ref 70–99)
Glucose-Capillary: 187 mg/dL — ABNORMAL HIGH (ref 70–99)
Glucose-Capillary: 113 mg/dL — ABNORMAL HIGH (ref 70–99)

## 2011-09-16 LAB — BASIC METABOLIC PANEL
BUN: 46 mg/dL — ABNORMAL HIGH (ref 6–23)
CO2: 30 mEq/L (ref 19–32)
Calcium: 8.4 mg/dL (ref 8.4–10.5)
Chloride: 96 mEq/L (ref 96–112)
Creatinine, Ser: 1.33 mg/dL — ABNORMAL HIGH (ref 0.50–1.10)
Glucose, Bld: 219 mg/dL — ABNORMAL HIGH (ref 70–99)

## 2011-09-16 MED ORDER — METOLAZONE 5 MG PO TABS
5.0000 mg | ORAL_TABLET | ORAL | Status: AC
  Administered 2011-09-17: 5 mg via ORAL
  Filled 2011-09-16: qty 1

## 2011-09-16 MED ORDER — GLUCOSE 40 % PO GEL
ORAL | Status: AC
  Administered 2011-09-16: 03:00:00
  Filled 2011-09-16: qty 1

## 2011-09-16 MED ORDER — GLUCOSE 40 % PO GEL
ORAL | Status: AC
  Filled 2011-09-16: qty 1

## 2011-09-16 MED ORDER — INSULIN ASPART 100 UNIT/ML ~~LOC~~ SOLN
0.0000 [IU] | Freq: Three times a day (TID) | SUBCUTANEOUS | Status: DC
  Administered 2011-09-16: 3 [IU] via SUBCUTANEOUS
  Administered 2011-09-16 – 2011-09-17 (×2): 5 [IU] via SUBCUTANEOUS
  Administered 2011-09-17: 2 [IU] via SUBCUTANEOUS
  Administered 2011-09-18 – 2011-09-20 (×5): 3 [IU] via SUBCUTANEOUS

## 2011-09-16 MED ORDER — INSULIN ASPART 100 UNIT/ML ~~LOC~~ SOLN: 0.0000 [IU] | Freq: Every day | SUBCUTANEOUS | Status: DC

## 2011-09-16 MED ORDER — FUROSEMIDE 40 MG PO TABS
40.0000 mg | ORAL_TABLET | Freq: Every day | ORAL | Status: DC
  Administered 2011-09-16: 40 mg via ORAL
  Filled 2011-09-16: qty 1

## 2011-09-16 MED ORDER — GLIPIZIDE ER 10 MG PO TB24
10.0000 mg | ORAL_TABLET | Freq: Two times a day (BID) | ORAL | Status: DC
  Administered 2011-09-17 – 2011-09-20 (×7): 10 mg via ORAL
  Filled 2011-09-16 (×8): qty 1

## 2011-09-16 MED ORDER — FUROSEMIDE 10 MG/ML IJ SOLN
40.0000 mg | Freq: Every day | INTRAMUSCULAR | Status: DC
  Administered 2011-09-16 – 2011-09-19 (×4): 40 mg via INTRAVENOUS
  Filled 2011-09-16 (×5): qty 4

## 2011-09-16 NOTE — Progress Notes (Addendum)
5 Days Post-Op Procedure(s) (LRB): PERMANENT PACEMAKER INSERTION (N/A)  Subjective: Patient without an appetite this morning. She denies abdominal pain, nausea, or emesis. She has a very low affect.  Objective: Vital signs in last 24 hours: Patient Vitals for the past 24 hrs:  BP Temp Temp src Pulse Resp SpO2 Weight  09/16/11 0420 120/64 mmHg 98.1 F (36.7 C) Oral 85  20  96 % 116 lb 2.9 oz (52.7 kg)  09/15/11 2033 115/62 mmHg 97.5 F (36.4 C) Oral 75  18  97 % -  09/15/11 1936 - - - - - 97 % -  09/15/11 1500 112/65 mmHg 97.6 F (36.4 C) Oral 76  18  99 % -  09/15/11 0940 - - - - - 94 % -  09/15/11 0929 - - - - - 95 % -   Pre op weight 47.6 kg Current Weight  09/16/11 116 lb 2.9 oz (52.7 kg)      Intake/Output from previous day: 06/28 0701 - 06/29 0700 In: 700 [P.O.:600] Out: 950 [Urine:950]   Physical Exam:  Cardiovascular: RRR, no murmurs, gallops, or rubs. Pulmonary: Diminished at bases R>L; no rales, wheezes, or rhonchi.Not tachypnic this am. Abdomen: Soft, non tender, bowel sounds present. Extremities: SCDs in place Wounds: Clean and dry.  No erythema or signs of infection.  Lab Results: CBC: Basename 09/15/11 0830  WBC 9.9  HGB 12.2  HCT 36.4  PLT 251   BMET:  Basename 09/15/11 0640  NA 137  K 3.8  CL 97  CO2 28  GLUCOSE 164*  BUN 42*  CREATININE 1.36*  CALCIUM 8.4     Assessment/Plan:  1. CV - Previous CHB so is s/p PPM.SR. Continue Norvasc 10 daily and Coreg 12.5 bid as with good bp control. 2.  Pulmonary - Encourage incentive spirometer.Wean O2 as tolerates.CXR this am shows bilateral pleural effusions R>L with atelectasis and mild pulmonary vascular congestion.Questionable need for thoracentesis. 3. Volume Overload - Will give Lasix 40 this am.Will check creatinine in am, but likely will require more diuresis. 4.  Acute blood loss anemia - Last H and H  5.DM-CBGs 49/106/240. On Glipizide 10 bid and SS PRN (will change to sensitive).Will hold  glipizide this am is not eating much lately. 6.Await transaminase and urine culture results. 7.Making very slow progress. Will need SNF eventually.  ZIMMERMAN,DONIELLE MPA-C 09/16/2011   LFTs all normal, urine culture pending, abdominal exam benign chest x-ray demonstrates persistent moderate pleural effusions and she will need continued IV daily Lasix and monitoring creatinine which is elevated at 1.3 with BUN 54. Chest x-ray again in a.m.

## 2011-09-16 NOTE — Progress Notes (Signed)
At 00:1 accu ck. Was 226 8 units  Of novolog s.q. Given per s.s. Orders. Patient did drink Ensure and ate cup of apple sauce. While I was at lunch patient called and c/o of feeling hot and sweaty . Bianca R.N. Went to assess pt. And accu ck was done at 01:58 accu ck 29 Patient was given Ensure and glucose gel recheck accu ck at 02:12  accu ck 43 re accu ck 49 at 02:25 2nd glucose gel given and some ice cream repeat accu ck at 02:50 106.  PT. IS feeling better. R.N. Aware.

## 2011-09-16 NOTE — Progress Notes (Signed)
CBG: 29  Treatment: 15 GM gel  Symptoms: Sweaty  Follow-up CBG: Time:0245 CBG Result:106  Possible Reasons for Event: Inadequate meal intake and Medication regimen:   Comments/MD notified:    Kathy Breach

## 2011-09-16 NOTE — Progress Notes (Signed)
CARDIAC REHAB PHASE I   PRE:  Rate/Rhythm: 74 SR  BP:  Supine:   Sitting: 108/52  Standing:    SaO2: 97 2L  MODE:  Ambulation: 460 ft   POST:  Rate/Rhythem:82 SR  BP:  Supine:   Sitting: 112/50  Standing:    SaO2: 96 2L  1445-1510 Assisted X 2 used rollater and O2 2L to ambulate. Gait steady with walker. Pt tires easily and is DOE. She took one sitting rest stop during walk. Pt back to recliner after walk with call light in reach. Encouraged use of IS  Deon Pilling

## 2011-09-16 NOTE — Progress Notes (Signed)
accu ck. Was 240 R.N. Aware ans decided to hold this dose of insulin and will have M.D. Review meds.

## 2011-09-17 LAB — GLUCOSE, CAPILLARY
Glucose-Capillary: 131 mg/dL — ABNORMAL HIGH (ref 70–99)
Glucose-Capillary: 113 mg/dL — ABNORMAL HIGH (ref 70–99)

## 2011-09-17 LAB — URINE CULTURE

## 2011-09-17 LAB — BASIC METABOLIC PANEL
CO2: 31 mEq/L (ref 19–32)
Chloride: 96 mEq/L (ref 96–112)
Potassium: 4.7 mEq/L (ref 3.5–5.1)
Sodium: 137 mEq/L (ref 135–145)

## 2011-09-17 MED ORDER — LEVOFLOXACIN IN D5W 250 MG/50ML IV SOLN
250.0000 mg | INTRAVENOUS | Status: DC
  Administered 2011-09-18 – 2011-09-19 (×2): 250 mg via INTRAVENOUS
  Filled 2011-09-17 (×3): qty 50

## 2011-09-17 MED ORDER — LEVOFLOXACIN IN D5W 500 MG/100ML IV SOLN
500.0000 mg | Freq: Once | INTRAVENOUS | Status: AC
  Administered 2011-09-17: 500 mg via INTRAVENOUS
  Filled 2011-09-17: qty 100

## 2011-09-17 MED ORDER — ALBUTEROL SULFATE (5 MG/ML) 0.5% IN NEBU: 2.5000 mg | INHALATION_SOLUTION | RESPIRATORY_TRACT | Status: DC | PRN

## 2011-09-17 MED ORDER — POTASSIUM CHLORIDE CRYS ER 20 MEQ PO TBCR
20.0000 meq | EXTENDED_RELEASE_TABLET | Freq: Every day | ORAL | Status: DC
  Administered 2011-09-18 – 2011-09-20 (×3): 20 meq via ORAL
  Filled 2011-09-17 (×3): qty 1

## 2011-09-17 NOTE — Progress Notes (Addendum)
6 Days Post-Op Procedure(s) (LRB): PERMANENT PACEMAKER INSERTION (N/A)  Subjective: Patient tired this morning.Her spirits are a little better.  Objective: Vital signs in last 24 hours: Patient Vitals for the past 24 hrs:  BP Temp Temp src Pulse Resp SpO2 Weight  09/17/11 0912 - - - - - 97 % -  09/17/11 0442 149/76 mmHg 98.4 F (36.9 C) Oral 83  18  96 % 115 lb 1.6 oz (52.209 kg)  09/16/11 1948 117/61 mmHg 97.3 F (36.3 C) Oral 79  18  97 % -  09/16/11 1510 121/68 mmHg 98.2 F (36.8 C) - 80  18  96 % -  09/16/11 1446 - - - - - 97 % -   Pre op weight 47.6 kg Current Weight  09/17/11 115 lb 1.6 oz (52.209 kg)      Intake/Output from previous day: 06/29 0701 - 06/30 0700 In: 240 [P.O.:240] Out: 1201 [Urine:1200; Stool:1]   Physical Exam:  Cardiovascular: RRR, no murmurs, gallops, or rubs. Pulmonary: Diminished at bases R>L; no rales, wheezes, or rhonchi.Not tachypnic this am. Abdomen: Soft, non tender, bowel sounds present. Extremities: No lower extremity edema. Wounds: Clean and dry.  No erythema or signs of infection.  Lab Results: CBC:  Basename 09/15/11 0830  WBC 9.9  HGB 12.2  HCT 36.4  PLT 251   BMET:   Basename 09/17/11 0527 09/16/11 0743  NA 137 136  K 4.7 4.6  CL 96 96  CO2 31 30  GLUCOSE 133* 219*  BUN 48* 46*  CREATININE 1.23* 1.33*  CALCIUM 8.9 8.4     Assessment/Plan:  1. CV - Previous CHB so is s/p PPM.SR. Continue Norvasc 10 daily and Coreg 12.5 bid as with good bp control. 2.  Pulmonary - Encourage incentive spirometer.Wean O2 as tolerates.Check CXR in am  3. Volume Overload - Continue Lasix 40 IV this am and Zaroxolyn. 4.  Acute blood loss anemia - Last H and H  5.DM-CBGs 221/113/131. On Glipizide 10 bid and SS PRN (will change to sensitive). 6.LFT results from yesterday within normal.Albumin is low at 2.6 7.Urine culture shows 100,000 gram negative rods. Will start Levaquin (based on sensitivities). 8.Making very slow progress. Will  need SNF eventually.  Thedford Bunton MPA-C 09/17/2011

## 2011-09-18 ENCOUNTER — Inpatient Hospital Stay (HOSPITAL_COMMUNITY): Payer: Medicare Other

## 2011-09-18 LAB — GLUCOSE, CAPILLARY
Glucose-Capillary: 169 mg/dL — ABNORMAL HIGH (ref 70–99)
Glucose-Capillary: 83 mg/dL (ref 70–99)

## 2011-09-18 MED ORDER — LEVALBUTEROL HCL 0.63 MG/3ML IN NEBU
0.6300 mg | INHALATION_SOLUTION | Freq: Four times a day (QID) | RESPIRATORY_TRACT | Status: DC | PRN
  Filled 2011-09-18: qty 3

## 2011-09-18 NOTE — Progress Notes (Signed)
Clinical Social Work-CSW confirmed insurance authorization for pt d/c to SNF-CSW will follow and facilitate d/c when deemed medically stable- Gerre Scull, [phone]

## 2011-09-18 NOTE — Progress Notes (Signed)
Inpatient Diabetes Program Recommendations  AACE/ADA: New Consensus Statement on Inpatient Glycemic Control (2009)  Target Ranges:  Prepandial:   less than 140 mg/dL      Peak postprandial:   less than 180 mg/dL (1-2 hours)      Critically ill patients:  140 - 180 mg/dL     Inpatient Diabetes Program Recommendations Oral Agents: D/C glucotrol 10 mg bid while in hospital d/t hypoglycemia and variable food intake  CBG=29 09-16-11 PO intake still 20-75%.  Recommend discontinuation of Glucotrol XL during hospitalization and reduce SSI to SENSITIVE.   Thank you  Raoul Pitch [facility] Inpatient Diabetes Coordinator [phone]

## 2011-09-18 NOTE — Progress Notes (Signed)
Physical Therapy Treatment Patient Details Name: Barbara Garner MRN: [id number] DOB: 1935-01-19 Today's Date: 09/18/2011 Time: [id number] PT Time Calculation (min): 24 min  PT Assessment / Plan / Recommendation Comments on Treatment Session  Pt s/p CABG, AVR and pacemaker who is continuing to progress with ambulation and activity however unable to recall any sternal precautions despite repeated education attempts. Pt continues to need cueing with all activity to maintain precautions but is moving well and  HR maintained 80-85 throughout session with sats 96% on 2L. Will continue to follow.     Follow Up Recommendations       Barriers to Discharge        Equipment Recommendations       Recommendations for Other Services    Frequency     Plan Discharge plan remains appropriate;Frequency remains appropriate    Precautions / Restrictions Precautions Precautions: Sternal;Fall Precaution Comments: pt unable to recall precautions   Pertinent Vitals/Pain No pain    Mobility  Bed Mobility Bed Mobility: Sit to Sidelying Right;Rolling Left Rolling Right: 5: Supervision Sit to Sidelying Right: 4: Min assist;HOB flat Details for Bed Mobility Assistance: Pt with assist to elevate legs onto surface and cueing for sequence and technique Transfers Transfers: Sit to Stand;Stand to Sit Sit to Stand: 5: Supervision;From toilet;From chair/3-in-1 Stand to Sit: 5: Supervision;To toilet;To bed Details for Transfer Assistance: cueing for hand placement to maintain precautions Ambulation/Gait Ambulation/Gait Assistance: 5: Supervision Ambulation Distance (Feet): 550 Feet Assistive device: Rolling walker Ambulation/Gait Assistance Details: cueing to step into RW and directional cues to return to room Gait Pattern: Step-through pattern;Decreased stride length Gait velocity: decreased Stairs: No    Exercises General Exercises - Lower Extremity Long Arc Quad: AROM;Both;20 reps;Seated Hip  Flexion/Marching: AROM;Both;20 reps;Seated   PT Diagnosis:    PT Problem List:   PT Treatment Interventions:     PT Goals Acute Rehab PT Goals PT Goal: Sit to Supine/Side - Progress: Progressing toward goal PT Goal: Sit to Stand - Progress: Progressing toward goal PT Goal: Stand to Sit - Progress: Progressing toward goal PT Goal: Ambulate - Progress: Progressing toward goal PT Goal: Up/Down Stairs - Progress: Progressing toward goal Additional Goals PT Goal: Additional Goal #1 - Progress: Progressing toward goal  Visit Information  Last PT Received On: 09/18/11 Assistance Needed: +1    Subjective Data  Subjective: "I'm losing my vision I can't read that"- referring to sternal precaution sheet   Cognition  Overall Cognitive Status: Impaired Area of Impairment: Memory Arousal/Alertness: Awake/alert Orientation Level: Appears intact for tasks assessed Behavior During Session: Flat affect Current Attention Level: Selective Memory: Decreased recall of precautions Memory Deficits: Pt educated for sternal precautions 3x during tx. Son educated and reading handout end of session with questions answered    Architectural technologist Sitting - Balance Support: No upper extremity supported;Feet unsupported Static Sitting - Level of Assistance: 7: Independent Static Sitting - Comment/# of Minutes: 3  End of Session PT - End of Session Activity Tolerance: Patient tolerated treatment well Patient left: in bed;with call bell/phone within reach;with family/visitor present   GP     Lanetta Inch Beth 09/18/2011, 3:51 PM Elwyn Reach, [place]

## 2011-09-18 NOTE — Progress Notes (Signed)
CARDIAC REHAB PHASE I   PRE:  Rate/Rhythm: 74 SR    BP: sitting 110/60    SaO2: 97 2L, 91 RA  MODE:  Ambulation: 550 ft   POST:  Rate/Rhythm: 84    BP: sitting 124/60     SaO2: 94 2L  Pt seemingly did not walk Sun (nothing documented, pt can't remember). Steady with RW, 2L, assist x1. Continues to c/o of fatigue but does well. To recliner. Only able to accomplish 500 mL on IS. Needs more motivation.  [id number]  Darrick Meigs CES, ACSM

## 2011-09-18 NOTE — Progress Notes (Addendum)
7 Days Post-Op Procedure(s) (LRB): PERMANENT PACEMAKER INSERTION (N/A) Subjective: Barbara Garner continues to state she doesn't feel well.  She is ambulating with assistance, but fatigues very easily.    Objective: Vital signs in last 24 hours: Temp:  [98.2 F (36.8 C)-98.5 F (36.9 C)] 98.2 F (36.8 C) (07/01 0517) Pulse Rate:  [69-74] 69  (07/01 0517) Cardiac Rhythm:  [-] Normal sinus rhythm (06/30 1935) Resp:  [18] 18  (07/01 0517) BP: (107-127)/(64-68) 127/65 mmHg (07/01 0517) SpO2:  [95 %-97 %] 95 % (07/01 0517) Weight:  [113 lb (51.256 kg)] 113 lb (51.256 kg) (07/01 0328)  Intake/Output from previous day: 06/30 0701 - 07/01 0700 In: 580 [P.O.:480; IV Piggyback:100] Out: 1500 [Urine:1500]    General appearance: alert, cooperative and no distress Heart: regular rate and rhythm Lungs: diminished breath sounds bibasilar Abdomen: soft, non-tender; bowel sounds normal; no masses,  no organomegaly Extremities: edema trace Wound: clean, some blood tinged drainage from LLE EVH site  Lab Results:  Basename 09/15/11 0830  WBC 9.9  HGB 12.2  HCT 36.4  PLT 251   BMET:  Basename 09/17/11 0527 09/16/11 0743  NA 137 136  K 4.7 4.6  CL 96 96  CO2 31 30  GLUCOSE 133* 219*  BUN 48* 46*  CREATININE 1.23* 1.33*  CALCIUM 8.9 8.4    PT/INR: No results found for this basename: LABPROT,INR in the last 72 hours ABG    Component Value Date/Time   PHART 7.481* 09/15/2011 0800   HCO3 29.0* 09/15/2011 0800   TCO2 30.2 09/15/2011 0800   ACIDBASEDEF 4.0* 09/07/2011 1158   O2SAT 94.4 09/15/2011 0800   CBG (last 3)   Basename 09/17/11 1616 09/17/11 1135 09/17/11 0623  GLUCAP 113* 231* 131*    Assessment/Plan: S/P Procedure(s) (LRB): PERMANENT PACEMAKER INSERTION (N/A)  1. CV- S/P PPM for CHB, maintaining NSR currently on Coreg and Norvasc 2. Pulm- bilateral pleural effusions, little improvement on CXR, will continue diuresis and IS, wean oxygen as tolerated 3. UTI- + Citrobacter  Freundii- on Levaquin per sensitivities 4. DM- CBGs moderately controlled on Glipizide and continue SSIP 5. Volume Overload- on Lasix and Zaroxolyn 6. Dispo- patient slowly progressing, still feels poor, will need SNF at discharge   LOS: 12 days    Garner, Barbara 09/18/2011   Although the patient continues to complain about feeling poorly she is slowly progressing. She will need PT when she transfers to SNF. Looks ready to go in 1-2 days, family in discussion with SW

## 2011-09-18 NOTE — Progress Notes (Signed)
Patient ID: Barbara Garner, female   DOB: [birth year], 77 y.o.   MRN: [id number]     SUBJECTIVE: Doing better than Friday but stills feels poorly.  Not dyspneic, no chest pain.      Marland Kitchen amLODipine  10 mg Oral Daily  . aspirin  325 mg Oral Daily  . atorvastatin  40 mg Oral q1800  . bisacodyl  10 mg Oral Daily   Or  . bisacodyl  10 mg Rectal Daily  . carvedilol  12.5 mg Oral BID WC  . docusate sodium  200 mg Oral Daily  . enoxaparin (LOVENOX) injection  30 mg Subcutaneous Q24H  . feeding supplement  237 mL Oral TID BM  . furosemide  40 mg Intravenous Daily  . glipiZIDE  10 mg Oral BID WC  . insulin aspart  0-15 Units Subcutaneous TID WC  . insulin aspart  0-5 Units Subcutaneous QHS  . levalbuterol  0.63 mg Nebulization TID  . levofloxacin (LEVAQUIN) IV  250 mg Intravenous Q24H  . levofloxacin (LEVAQUIN) IV  500 mg Intravenous Once  . levothyroxine  50 mcg Oral QAC breakfast  . mupirocin ointment   Nasal BID  . pantoprazole  40 mg Oral Q1200  . potassium chloride  20 mEq Oral Daily      Filed Vitals:   09/17/11 2035 09/17/11 2051 09/18/11 0328 09/18/11 0517  BP:  107/68  127/65  Pulse:  72  69  Temp:  98.2 F (36.8 C)  98.2 F (36.8 C)  TempSrc:  Oral  Oral  Resp:  18  18  Height:      Weight:   51.256 kg (113 lb)   SpO2: 96% 97%  95%    Intake/Output Summary (Last 24 hours) at 09/18/11 1302 Last data filed at 09/18/11 0800  Gross per 24 hour  Intake    580 ml  Output    900 ml  Net   -320 ml    LABS: Basic Metabolic Panel:  Basename 09/17/11 0527 09/16/11 0743  NA 137 136  K 4.7 4.6  CL 96 96  CO2 31 30  GLUCOSE 133* 219*  BUN 48* 46*  CREATININE 1.23* 1.33*  CALCIUM 8.9 8.4  MG -- --  PHOS -- --   Liver Function Tests:  [facility] 09/16/11 0743  AST 35  ALT 19  ALKPHOS 80  BILITOT 0.9  PROT 5.9*  ALBUMIN 2.6*    Basename 09/15/11 2151  LIPASE --  AMYLASE 28   CBC: No results found for this basename:  WBC:2,NEUTROABS:2,HGB:2,HCT:2,MCV:2,PLT:2 in the last 72 hours  PHYSICAL EXAM General: NAD, frail Neck: No JVD, no thyromegaly or thyroid nodule.  Lungs: Slightly decreased breath sounds at bases bilaterally.  CV: Nondisplaced PMI.  Heart regular S1/S2, no S3/S4, 2/6 SEM RUSB.  Trace ankle edema.  No carotid bruit.  Normal pedal pulses.  Abdomen: Soft, nontender, no hepatosplenomegaly, no distention.  Neurologic: Alert and oriented x 3.  Psych: Normal affect. Extremities: No clubbing or cyanosis.   ASSESSMENT AND PLAN:  77 yo s/p CABG/bioprosthetic AVR AVR complicated by post-op heart block and PCM placement.  1. CAD: s/p CABG.  Continue ASA, statin.  2. AS: s/p AVR.  3. Rhythm: Post-op heart block, had PCM.  Currently in NSR with no pacing.  4. CHF: Pleural effusions smaller with diuresis, now small bilateral effusions.  She really does not look significantly volume overloaded, suspect it would be fine to switch her to po Lasix.  Will get BMET in am.  5. HTN: BP controlled.  6. I suspect she is near ready for SNF.  She actually seems to be progressing well though her outlook is somewhat depressed.   Loralie Champagne 09/18/2011 1:02 PM

## 2011-09-18 NOTE — Progress Notes (Signed)
Occupational Therapy Treatment Patient Details Name: Barbara Garner MRN: [id number] DOB: 1934-05-29 Today's Date: 09/18/2011 Time: [id number] OT Time Calculation (min): 28 min  OT Assessment / Plan / Recommendation Comments on Treatment Session Pt requires encouragement to participate in ADL.  Not able to recall sternal precautions.  Pt is moving well with improving endurance.    Follow Up Recommendations  Skilled nursing facility    Barriers to Discharge       Equipment Recommendations  Defer to next venue    Recommendations for Other Services    Frequency Min 2X/week   Plan Discharge plan needs to be updated    Precautions / Restrictions Precautions Precautions: Sternal;Fall Precaution Comments: Pt not able to state her precaution, reviewed with her.   Pertinent Vitals/Pain     ADL  Grooming: Performed;Min guard;Brushing hair;Other (comment);Denture care (after shampoo cap) Where Assessed - Grooming: Unsupported standing Upper Body Dressing: Performed;Other (comment);Minimal assistance (gown changed) Where Assessed - Upper Body Dressing: Unsupported sitting Lower Body Dressing: Performed;Supervision/safety (socks) Where Assessed - Lower Body Dressing: Unsupported sitting Toilet Transfer: Performed;Supervision/safety Toilet Transfer Method: Sit to Loss adjuster, chartered: Bedside commode Toileting - Clothing Manipulation and Hygiene: Supervision/safety Where Assessed - Toileting Clothing Manipulation and Hygiene: Standing ADL Comments: Pt requiring encouragment to participate.  Pt with some tangential conversation.  Asked what nasal canula was even having had it since admission.    OT Diagnosis:    OT Problem List:   OT Treatment Interventions:     OT Goals ADL Goals Pt Will Perform Grooming: with set-up;with supervision;Sitting, edge of bed;Sitting, chair;Unsupported ADL Goal: Grooming - Progress: Met Pt Will Perform Lower Body Dressing: with modified  independence;Sit to stand from chair;Sit to stand from bed ADL Goal: Lower Body Dressing - Progress: Progressing toward goals Pt Will Transfer to Toilet: with modified independence ADL Goal: Toilet Transfer - Progress: Progressing toward goals Pt Will Perform Toileting - Clothing Manipulation: with modified independence ADL Goal: Toileting - Clothing Manipulation - Progress: Progressing toward goals Pt Will Perform Toileting - Hygiene: with modified independence ADL Goal: Toileting - Hygiene - Progress: Progressing toward goals Miscellaneous OT Goals Miscellaneous OT Goal #2: Pt will recall 3/5 sternal precautions and incorporate them into ADL activity with mod VCs. OT Goal: Miscellaneous Goal #2 - Progress: Not met  Visit Information  Last OT Received On: 09/18/11 Assistance Needed: +1    Subjective Data      Prior Functioning       Cognition  Overall Cognitive Status: Impaired Area of Impairment: Memory Arousal/Alertness: Awake/alert Orientation Level: Appears intact for tasks assessed Behavior During Session: Flat affect Current Attention Level: Sustained Memory: Decreased recall of precautions Memory Deficits: Reinstructed in precautions.    Mobility Transfers Sit to Stand: 5: Supervision;With upper extremity assist;Other (comment);With armrests;From chair/3-in-1 (verbal cues for sternal precautions on armrests) Stand to Sit: To chair/3-in-1;With upper extremity assist;5: Supervision Details for Transfer Assistance: v/c's for safe hand placement   Exercises    Balance    End of Session OT - End of Session Equipment Utilized During Treatment: Gait belt Activity Tolerance: Other (comment) (pt is self limiting) Patient left: in chair;with call bell/phone within reach;with family/visitor present  GO     Barbara Garner 09/18/2011, 1:05 PM [phone]

## 2011-09-19 LAB — GLUCOSE, CAPILLARY

## 2011-09-19 MED ORDER — LEVOFLOXACIN 250 MG PO TABS: 250.0000 mg | ORAL_TABLET | Freq: Every day | ORAL | Status: AC

## 2011-09-19 MED ORDER — POLYETHYLENE GLYCOL 3350 17 G PO PACK
17.0000 g | PACK | Freq: Every day | ORAL | Status: DC
  Administered 2011-09-19: 17 g via ORAL
  Filled 2011-09-19 (×2): qty 1

## 2011-09-19 MED ORDER — CARVEDILOL 12.5 MG PO TABS: 12.5000 mg | ORAL_TABLET | Freq: Two times a day (BID) | ORAL | Status: DC

## 2011-09-19 NOTE — Progress Notes (Signed)
8 Days Post-Op Procedure(s) (LRB): PERMANENT PACEMAKER INSERTION (N/A) Subjective: Ms. Duve has no new complaints this morning.  She states her shortness of breath is improving.    Objective: Vital signs in last 24 hours: Temp:  [97.3 F (36.3 C)-98.2 F (36.8 C)] 97.3 F (36.3 C) (07/02 0444) Pulse Rate:  [76-82] 82  (07/02 0444) Cardiac Rhythm:  [-] Normal sinus rhythm (07/01 1940) Resp:  [18] 18  (07/02 0444) BP: (117-128)/(61-68) 117/62 mmHg (07/02 0444) SpO2:  [89 %-98 %] 91 % (07/02 0445)  Intake/Output from previous day: 07/01 0701 - 07/02 0700 In: 360 [P.O.:360] Out: -   General appearance: alert, cooperative and no distress Heart: regular rate and rhythm Lungs: diminished breath sounds bibasilar Abdomen: soft, non-tender; bowel sounds normal; no masses,  no organomegaly Extremities: no edema, bilateraly ecchymosis due to [facility] Wound: clean and dry  Lab Results: No results found for this basename: WBC:2,HGB:2,HCT:2,PLT:2 in the last 72 hours BMET:  [facility] 09/17/11 0527  NA 137  K 4.7  CL 96  CO2 31  GLUCOSE 133*  BUN 48*  CREATININE 1.23*  CALCIUM 8.9    PT/INR: No results found for this basename: LABPROT,INR in the last 72 hours ABG    Component Value Date/Time   PHART 7.481* 09/15/2011 0800   HCO3 29.0* 09/15/2011 0800   TCO2 30.2 09/15/2011 0800   ACIDBASEDEF 4.0* 09/07/2011 1158   O2SAT 94.4 09/15/2011 0800   CBG (last 3)   Basename 09/19/11 0552 09/18/11 2137 09/18/11 1636  GLUCAP 184* 83 169*    Assessment/Plan: S/P Procedure(s) (LRB): PERMANENT PACEMAKER INSERTION (N/A)  1. CV- S/P PPM, NSR continue Coreg and Norvasc 2. Pulmonary- improvement of bilateral effusions on CXR from 7/1, will wean oxygen as tolerated, continue IS, continue Lasix  3. UTI- + Citrobacter Freundii- on Levaquin would treat for 7 days total (Day 2/7) 4. DM- continue Glipizide, SSIP 5. Deconditioning- PT/OT recs SNF 6. Dispo- patient is progressing slowly, will repeat  labs and CXR in AM and plan to d/c to SNF tomorrow   LOS: 13 days    Ahmed Prima, Junie Panning 09/19/2011

## 2011-09-19 NOTE — Progress Notes (Signed)
CARDIAC REHAB PHASE I   PRE:  Rate/Rhythm: 83SR  BP:  Supine:   Sitting: 120/60  Standing:    SaO2: 94% 3.5L, 97% 2L, 94%RA   MODE:  Ambulation: 660 ft   POST:  Rate/Rhythem: 85  BP:  Supine:   Sitting: 130/70  Standing:    SaO2: 91-92%RA monitored sats whole walk 0916-1004 Pt walked 660 ft on RA with rolling walker and asst x 1 with steady gait. Monitored sats the whole walk and sats never below 91% on RA. Encouraged pt to take standing rest breaks and to use pursed-lip breathing.  Pt states that she feels weak. Appears depressed. When we walked on 6/26 pt was talkative and acting as if she was going to run people in hall over. Now pt has flat affect and only talks when asked questions. To recliner after walk. Notified RN that I left pt off oxygen. Had pt demonstrated IS and she can get to 1250 but states makes her breast hurt.  [name]

## 2011-09-19 NOTE — Progress Notes (Addendum)
Nutrition Follow-up  Intervention:    Continue Glucerna Shake 3 times daily  RD to follow for nutrition care plan  Patient's PO intake variable at 5-75% per flowsheet records. Glucerna Shake 3 times daily between meals ordered -- states she had one yesterday. Plan is to discharge to SNF.  Diet Order:  Carbohydrate Modified Medium Calorie  Meds: Scheduled Meds:   . amLODipine  10 mg Oral Daily  . aspirin  325 mg Oral Daily  . atorvastatin  40 mg Oral q1800  . bisacodyl  10 mg Oral Daily   Or  . bisacodyl  10 mg Rectal Daily  . carvedilol  12.5 mg Oral BID WC  . docusate sodium  200 mg Oral Daily  . enoxaparin (LOVENOX) injection  30 mg Subcutaneous Q24H  . feeding supplement  237 mL Oral TID BM  . furosemide  40 mg Intravenous Daily  . glipiZIDE  10 mg Oral BID WC  . insulin aspart  0-15 Units Subcutaneous TID WC  . insulin aspart  0-5 Units Subcutaneous QHS  . levofloxacin (LEVAQUIN) IV  250 mg Intravenous Q24H  . levothyroxine  50 mcg Oral QAC breakfast  . mupirocin ointment   Nasal BID  . pantoprazole  40 mg Oral Q1200  . polyethylene glycol  17 g Oral Daily  . potassium chloride  20 mEq Oral Daily  . DISCONTD: levalbuterol  0.63 mg Nebulization TID   Continuous Infusions:  PRN Meds:.acetaminophen, levalbuterol, ondansetron (ZOFRAN) IV, phenol, sodium chloride, DISCONTD: albuterol  Labs:  CMP     Component Value Date/Time   NA 137 09/17/2011 0527   K 4.7 09/17/2011 0527   CL 96 09/17/2011 0527   CO2 31 09/17/2011 0527   GLUCOSE 133* 09/17/2011 0527   BUN 48* 09/17/2011 0527   CREATININE 1.23* 09/17/2011 0527   CALCIUM 8.9 09/17/2011 0527   PROT 5.9* 09/16/2011 0743   ALBUMIN 2.6* 09/16/2011 0743   AST 35 09/16/2011 0743   ALT 19 09/16/2011 0743   ALKPHOS 80 09/16/2011 0743   BILITOT 0.9 09/16/2011 0743   GFRNONAA 42* 09/17/2011 0527   GFRAA 48* 09/17/2011 0527     Intake/Output Summary (Last 24 hours) at 09/19/11 1125 Last data filed at 09/19/11 0800  Gross per 24 hour   Intake    360 ml  Output      0 ml  Net    360 ml    CBG (last 3)   Basename 09/19/11 0552 09/18/11 2137 09/18/11 1636  GLUCAP 184* 83 169*    Weight Status:  51.2 kg (7/1) -- trending down  Re-estimated needs:  1200-1400 kcals, 65-75 gm protein  Nutrition Dx:  Inadequate Oral Intake, ongoing  Goal:  Oral intake with meals & supplements to meet >90% of estimated nutrition needs, progressing  Monitor:  PO intake, weight, labs, I/O's  Lady Deutscher Pager #: [phone]

## 2011-09-20 ENCOUNTER — Inpatient Hospital Stay (HOSPITAL_COMMUNITY): Payer: Medicare Other

## 2011-09-20 LAB — BASIC METABOLIC PANEL
CO2: 32 mEq/L (ref 19–32)
Calcium: 9 mg/dL (ref 8.4–10.5)
Chloride: 93 mEq/L — ABNORMAL LOW (ref 96–112)
Potassium: 3.8 mEq/L (ref 3.5–5.1)
Sodium: 136 mEq/L (ref 135–145)

## 2011-09-20 LAB — CBC
MCV: 92 fL (ref 78.0–100.0)
Platelets: 454 10*3/uL — ABNORMAL HIGH (ref 150–400)
RBC: 3.87 MIL/uL (ref 3.87–5.11)
WBC: 11.1 10*3/uL — ABNORMAL HIGH (ref 4.0–10.5)

## 2011-09-20 LAB — GLUCOSE, CAPILLARY: Glucose-Capillary: 168 mg/dL — ABNORMAL HIGH (ref 70–99)

## 2011-09-20 MED ORDER — LEVOFLOXACIN 250 MG PO TABS
250.0000 mg | ORAL_TABLET | ORAL | Status: DC
  Filled 2011-09-20: qty 1

## 2011-09-20 MED ORDER — GLUCERNA SHAKE PO LIQD: 237.0000 mL | Freq: Three times a day (TID) | ORAL | Status: DC

## 2011-09-20 MED ORDER — POTASSIUM CHLORIDE CRYS ER 20 MEQ PO TBCR
40.0000 meq | EXTENDED_RELEASE_TABLET | Freq: Once | ORAL | Status: AC
  Administered 2011-09-20: 40 meq via ORAL
  Filled 2011-09-20: qty 1

## 2011-09-20 NOTE — Progress Notes (Signed)
Occupational Therapy Treatment Patient Details Name: Barbara Garner MRN: [id number] DOB: 08-Oct-1934 Today's Date: 09/20/2011 Time: [id number] OT Time Calculation (min): 42 min  OT Assessment / Plan / Recommendation Comments on Treatment Session Pt continues to gain endurance.  Good participation in ADL today.  Cognition remains a concern.    Follow Up Recommendations  Skilled nursing facility    Barriers to Discharge       Equipment Recommendations       Recommendations for Other Services    Frequency Min 2X/week   Plan Discharge plan remains appropriate    Precautions / Restrictions Precautions Precautions: Sternal;Fall Precaution Comments: continues to need reminders for sternal precautions. Restrictions Weight Bearing Restrictions: No   Pertinent Vitals/Pain     ADL  Grooming: Performed;Denture care;Brushing hair;Supervision/safety Where Assessed - Grooming: Supported standing Upper Body Bathing: Performed;Minimal assistance Where Assessed - Upper Body Bathing: Unsupported sitting Lower Body Bathing: Performed;Minimal assistance Where Assessed - Lower Body Bathing: Unsupported sit to stand Upper Body Dressing: Performed;Set up Where Assessed - Upper Body Dressing: Unsupported sitting Lower Body Dressing: Performed;Minimal assistance Where Assessed - Lower Body Dressing: Unsupported sitting;Unsupported sit to stand Toilet Transfer: Publishing copy: Regular height toilet Toileting - Clothing Manipulation and Hygiene: Supervision/safety Equipment Used: Rolling walker;Gait belt ADL Comments: Pt continues to have problem solving and planning issues as well as inability to recall sternal precautions.  Pt states she is ready for move to SNF for rehab.    OT Diagnosis:    OT Problem List:   OT Treatment Interventions:     OT Goals ADL Goals Pt Will Perform Grooming: with set-up;with supervision;Sitting, edge of bed;Sitting,  chair;Unsupported ADL Goal: Grooming - Progress: Progressing toward goals Pt Will Perform Lower Body Bathing: with modified independence;Sit to stand from chair;Sit to stand from bed ADL Goal: Lower Body Bathing - Progress: Progressing toward goals Pt Will Perform Lower Body Dressing: with modified independence;Sit to stand from chair;Sit to stand from bed ADL Goal: Lower Body Dressing - Progress: Progressing toward goals Pt Will Transfer to Toilet: with modified independence ADL Goal: Toilet Transfer - Progress: Progressing toward goals Pt Will Perform Toileting - Clothing Manipulation: with modified independence ADL Goal: Toileting - Clothing Manipulation - Progress: Progressing toward goals Pt Will Perform Toileting - Hygiene: with modified independence ADL Goal: Toileting - Hygiene - Progress: Progressing toward goals Miscellaneous OT Goals Miscellaneous OT Goal #2: Pt will recall 3/5 sternal precautions and incorporate them into ADL activity with mod VCs. OT Goal: Miscellaneous Goal #2 - Progress: Not progressing  Visit Information  Last OT Received On: 09/20/11 Assistance Needed: +1    Subjective Data      Prior Functioning       Cognition  Overall Cognitive Status: Impaired Area of Impairment: Memory;Problem solving Arousal/Alertness: Awake/alert Orientation Level: Appears intact for tasks assessed Behavior During Session: Flat affect Problem Solving: required verbal cues to sequence denture care and mouth rinse at sink.    Mobility Transfers Sit to Stand: 5: Supervision;From toilet;From chair/3-in-1 Stand to Sit: 5: Supervision;To toilet;To bed Details for Transfer Assistance: cueing for hand placement to maintain precautions   Exercises    Balance    End of Session OT - End of Session Activity Tolerance: Patient tolerated treatment well Patient left: in chair;with call bell/phone within reach  GO     [name] 09/20/2011, 10:05 AM [phone]

## 2011-09-20 NOTE — Progress Notes (Signed)
9 Days Post-Op Procedure(s) (LRB): PERMANENT PACEMAKER INSERTION (N/A) Subjective: No new complaints.  + BM yesterday  Objective: Vital signs in last 24 hours: Temp:  [97.3 F (36.3 C)-98.2 F (36.8 C)] 97.3 F (36.3 C) (07/03 0429) Pulse Rate:  [73-81] 81  (07/03 0429) Cardiac Rhythm:  [-] Normal sinus rhythm (07/03 0429) Resp:  [18] 18  (07/03 0429) BP: (120-143)/(62-73) 143/73 mmHg (07/03 0429) SpO2:  [91 %-93 %] 92 % (07/03 0429) Weight:  [108 lb 0.4 oz (49 kg)] 108 lb 0.4 oz (49 kg) (07/03 0429)  Intake/Output from previous day: 07/02 0701 - 07/03 0700 In: 600 [P.O.:600] Out: -   General appearance: alert, cooperative and no distress Neurologic: intact Heart: regular rate and rhythm Lungs: clear to auscultation bilaterally Abdomen: soft, non-tender; bowel sounds normal; no masses,  no organomegaly Extremities: edema trace, ecchymosis bilateral LE Wound: clean and dry  Lab Results:  Basename 09/20/11 0500  WBC 11.1*  HGB 12.0  HCT 35.6*  PLT 454*   BMET:  Basename 09/20/11 0500  NA 136  K 3.8  CL 93*  CO2 32  GLUCOSE 101*  BUN 54*  CREATININE 1.71*  CALCIUM 9.0    PT/INR: No results found for this basename: LABPROT,INR in the last 72 hours ABG    Component Value Date/Time   PHART 7.481* 09/15/2011 0800   HCO3 29.0* 09/15/2011 0800   TCO2 30.2 09/15/2011 0800   ACIDBASEDEF 4.0* 09/07/2011 1158   O2SAT 94.4 09/15/2011 0800   CBG (last 3)   Basename 09/20/11 0605 09/19/11 2050 09/19/11 1624  GLUCAP 109* 83 82    Assessment/Plan: S/P Procedure(s) (LRB): PERMANENT PACEMAKER INSERTION (N/A)  1. CV- NSR, rate and pressure controlled on Coreg and Norvasc 2. Pulmonary- improvement of bilateral pleural effusions, continue IS. PO Lasix 3. UTI-+ Citrobacter Freundii, on Levaquin Day 3/7 4. DM- on home Glipizide 5. Hypokalemia- continue supplementation while on Lasix 6. Deconditioning- PT/OT recs SNF 7. Dispo- patient is progressing, will plan for d/c to  SNF today if bed available   LOS: 14 days    [name], [name] 09/20/2011

## 2011-09-20 NOTE — Progress Notes (Signed)
[id number] Education completed with pt. Encouraged pt to review written material later. Put on discharge video for pt to watch. Permission given to refer to [facility] Phase 2. Torres Hardenbrook DunlapRN

## 2011-09-20 NOTE — Progress Notes (Signed)
PHARMACIST - PHYSICIAN COMMUNICATION DR:   CVTS CONCERNING: Antibiotic IV to Oral Route Change Policy  RECOMMENDATION: This patient is receiving Levaquin by the intravenous route.  Based on criteria approved by the Pharmacy and Therapeutics Committee, the antibiotic(s) is/are being converted to the equivalent oral dose form(s).   DESCRIPTION: These criteria include:  Patient being treated for a respiratory tract infection, urinary tract infection, or cellulitis  The patient is not neutropenic and does not exhibit a GI malabsorption state  The patient is eating (either orally or via tube) and/or has been taking other orally administered medications for a least 24 hours  The patient is improving clinically and has a Tmax < 100.5  If you have questions about this conversion, please contact the Pharmacy Department  []   [phone] )  [facility] [x]   [phone] )  [facility]  []   [phone] )  [facility] []   [phone] )  [place]. Alford Highland, PharmD, [place] Clinical Staff Pharmacist Pager [phone]

## 2011-09-20 NOTE — Progress Notes (Signed)
Physical Therapy Treatment Patient Details Name: Barbara Garner MRN: [id number] DOB: July 04, 1934 Today's Date: 09/20/2011 Time: [id number] PT Time Calculation (min): 9 min  PT Assessment / Plan / Recommendation Comments on Treatment Session  Session shortened due to transport arriving to take pt to SNF; nurse tech in to change pt's gown, etc. Pt demonstrating some recall of precautions with questioning cues. will continue to benefit from PT at SNF    Follow Up Recommendations  Skilled nursing facility    Barriers to Discharge        Equipment Recommendations  Defer to next venue    Recommendations for Other Services    Frequency Min 3X/week   Plan Discharge plan remains appropriate;Frequency remains appropriate    Precautions / Restrictions Precautions Precautions: Sternal;Fall Precaution Comments: continues to need reminders for sternal precautions. Restrictions Weight Bearing Restrictions: No   Pertinent Vitals/Pain "a little sore" in sternum    Mobility  Bed Mobility Bed Mobility: Not assessed Transfers Transfers: Sit to Stand;Stand to Sit Sit to Stand: 5: Supervision;With upper extremity assist;From chair/3-in-1;From toilet Stand to Sit: 5: Supervision;To chair/3-in-1;To toilet Details for Transfer Assistance: cues to place hands on knees; pt with very light use of UEs on knees Ambulation/Gait Ambulation/Gait Assistance: 5: Supervision Ambulation Distance (Feet): 20 Feet (10x2) Assistive device: Rolling walker Ambulation/Gait Assistance Details: Pt with safe use of RW into/out of BR (including over threshold and keeping RW with her as she moved to sink to wash hands) Gait Pattern: Step-through pattern;Decreased stride length Gait velocity: decreased    Exercises     PT Diagnosis:    PT Problem List:   PT Treatment Interventions:     PT Goals Acute Rehab PT Goals PT Goal: Sit to Stand - Progress: Progressing toward goal PT Goal: Stand to Sit - Progress:  Progressing toward goal PT Goal: Ambulate - Progress: Partly met Additional Goals PT Goal: Additional Goal #1 - Progress: Progressing toward goal  Visit Information  Last PT Received On: 09/20/11 Assistance Needed: +1    Subjective Data  Subjective: "OK.Marland KitchenMarland KitchenIf I have to." re: walking and working on balance Patient Stated Goal: return home   Cognition  Overall Cognitive Status: Impaired Area of Impairment: Memory;Problem solving Arousal/Alertness: Awake/alert Orientation Level: Appears intact for tasks assessed Behavior During Session: Flat affect Current Attention Level: Selective Memory: Decreased recall of precautions Memory Deficits: Pt able to state that she should not pull with her arms; when asked if she is allowed to push up on armrests, able to state she should not; when asked where she should put her hands as she goes to stand up, stated "on my feet?"; forgot to turn off water at sink and required cues to notice what she had forgotten Problem Solving: pt unable to figure out how to turn water off at sink, eventhough she had turned it on (pushing on faucet--required cues to redirect to handles, when hot did not turn it off, required cues to try cold handle)    Balance     End of Session PT - End of Session Equipment Utilized During Treatment: Gait belt Activity Tolerance: Patient tolerated treatment well Patient left: in chair;with call bell/phone within reach   GP     Taym Twist 09/20/2011, 11:50 AM Pager [phone] '

## 2011-09-20 NOTE — Discharge Summary (Signed)
I agree with the above discharge summary and plan for follow-up.  [name],Barbara Garner  

## 2011-09-21 LAB — CULTURE, BLOOD (ROUTINE X 2): Culture: NO GROWTH

## 2011-09-27 ENCOUNTER — Ambulatory Visit: Payer: Medicare Other

## 2011-09-28 ENCOUNTER — Ambulatory Visit: Payer: Medicare Other

## 2011-10-04 ENCOUNTER — Ambulatory Visit (INDEPENDENT_AMBULATORY_CARE_PROVIDER_SITE_OTHER): Payer: Medicare Other | Admitting: *Deleted

## 2011-10-04 ENCOUNTER — Encounter: Payer: Self-pay | Admitting: Internal Medicine

## 2011-10-04 DIAGNOSIS — I442 Atrioventricular block, complete: Secondary | ICD-10-CM

## 2011-10-04 DIAGNOSIS — I4891 Unspecified atrial fibrillation: Secondary | ICD-10-CM

## 2011-10-04 LAB — PACEMAKER DEVICE OBSERVATION
AL AMPLITUDE: 2.8 mv
AL IMPEDENCE PM: 514 Ohm
AL THRESHOLD: 0.75 v
ATRIAL PACING PM: 1
BAMS-0001: 150 {beats}/min
BATTERY VOLTAGE: 2.8 v
RV LEAD AMPLITUDE: 11.2 mv
RV LEAD IMPEDENCE PM: 690 Ohm
RV LEAD THRESHOLD: 1 v
VENTRICULAR PACING PM: 2

## 2011-10-04 NOTE — Progress Notes (Signed)
Wound check-PPM 

## 2011-10-05 ENCOUNTER — Other Ambulatory Visit: Payer: Self-pay | Admitting: Thoracic Surgery (Cardiothoracic Vascular Surgery)

## 2011-10-05 DIAGNOSIS — I2510 Atherosclerotic heart disease of native coronary artery without angina pectoris: Secondary | ICD-10-CM

## 2011-10-09 ENCOUNTER — Ambulatory Visit
Admission: RE | Admit: 2011-10-09 | Discharge: 2011-10-09 | Disposition: A | Discharge status: Home / Self Care | Payer: Medicare Other | Source: Ambulatory Visit | Attending: Thoracic Surgery (Cardiothoracic Vascular Surgery) | Admitting: Thoracic Surgery (Cardiothoracic Vascular Surgery)

## 2011-10-09 ENCOUNTER — Ambulatory Visit (INDEPENDENT_AMBULATORY_CARE_PROVIDER_SITE_OTHER): Payer: Self-pay | Admitting: Thoracic Surgery (Cardiothoracic Vascular Surgery)

## 2011-10-09 ENCOUNTER — Encounter: Payer: Self-pay | Admitting: Thoracic Surgery (Cardiothoracic Vascular Surgery)

## 2011-10-09 VITALS — BP 140/74 | HR 82 | Temp 98.2°F | Resp 16 | Ht 62.0 in | Wt 104.5 lb

## 2011-10-09 DIAGNOSIS — Z951 Presence of aortocoronary bypass graft: Secondary | ICD-10-CM

## 2011-10-09 DIAGNOSIS — Z952 Presence of prosthetic heart valve: Secondary | ICD-10-CM

## 2011-10-09 DIAGNOSIS — I359 Nonrheumatic aortic valve disorder, unspecified: Secondary | ICD-10-CM

## 2011-10-09 DIAGNOSIS — Z954 Presence of other heart-valve replacement: Secondary | ICD-10-CM

## 2011-10-09 DIAGNOSIS — I2510 Atherosclerotic heart disease of native coronary artery without angina pectoris: Secondary | ICD-10-CM

## 2011-10-09 NOTE — Progress Notes (Signed)
[address]            [place] [address]          [phone]     CARDIOTHORACIC SURGERY OFFICE NOTE  Referring Provider is Larey Dresser, MD PCP is Loura Pardon, MD   HPI:  Patient returns for a follow up visit post Aortic Valve Replacement and a Coronary Artery Bipass Grafting on 09/06/2011. She complains of minor soreness around the incisions and pains that are exacerbated by physical exertion. Otherwise she has no specific complaints. She reports that her breathing has continued to gradually improve. She is not using any oxygen therapy. She states that she is walking some that she's not very clear about, talking she is doing. She notes that she has been working with the physical therapist at the skilled nursing facility where she is recovering. She thinks that she slowly getting stronger. She admits that she's not eating very well but she states she doesn't like they're food. She's not having any tachypalpitations nor dizzy spells.  Current Outpatient Prescriptions  Medication Sig Dispense Refill  . albuterol (PROVENTIL HFA;VENTOLIN HFA) 108 (90 BASE) MCG/ACT inhaler Inhale 2 puffs into the lungs every 4 (four) hours as needed. For shortness of breath.      Marland Kitchen amLODipine (NORVASC) 10 MG tablet Take 10 mg by mouth daily.       Marland Kitchen aspirin 325 MG EC tablet Take 325 mg by mouth daily.        Marland Kitchen atorvastatin (LIPITOR) 40 MG tablet Take 40 mg by mouth daily.      . carvedilol (COREG) 12.5 MG tablet Take 1 tablet (12.5 mg total) by mouth 2 (two) times daily with a meal.  60 tablet  1  . feeding supplement (GLUCERNA SHAKE) LIQD Take 237 mLs by mouth 3 (three) times daily between meals.      . furosemide (LASIX) 20 MG tablet Take 20 mg by mouth daily.       Marland Kitchen glipiZIDE (GLUCOTROL XL) 10 MG 24 hr tablet Take 10 mg by mouth 2 (two) times daily.      . hydrocodone-acetaminophen (LORCET-HD) 5-500 MG per capsule Take 1 capsule by mouth every 6 (six) hours as  needed.      Marland Kitchen levothyroxine (SYNTHROID, LEVOTHROID) 50 MCG tablet Take 50 mcg by mouth daily.      . miconazole (MICOTIN) 2 % powder Apply topically 2 (two) times daily. Apply under breasts twice daily for fungal rash      . omeprazole (PRILOSEC) 20 MG capsule Take 20 mg by mouth daily.      . phosphate (FLEET) enema Place 1 enema rectally once as needed.      . potassium chloride SA (K-DUR,KLOR-CON) 20 MEQ tablet Take 20 mEq by mouth daily.      Marland Kitchen senna (SENOKOT) 8.6 MG TABS Take 1 tablet by mouth daily.      Marland Kitchen zolpidem (AMBIEN) 5 MG tablet Take 5 mg by mouth at bedtime as needed.      Marland Kitchen DISCONTD: potassium chloride SA (K-DUR,KLOR-CON) 20 MEQ tablet Take 1 tablet (20 mEq total) by mouth daily. Should be taking daily if on Lasix  30 tablet  1      Physical Exam:   BP 140/74  Pulse 82  Temp 98.2 F (36.8 C)  Resp 16  Ht 5\' 2"  (1.575 m)  Wt 47.401 kg (  104 lb 8 oz)  BMI 19.11 kg/m2  SpO2 96%  General:  Patient appears well overall  Chest:   Clear with slightly diminished breath sounds both bases, L>R  CV:   RRR with soft systolic murmur best LSB  Incisions:  Healing well  Abdomen:  soft  Extremities:  Warm, no edema  Diagnostic Tests:  *RADIOLOGY REPORT*  Clinical Data: Heart surgery, 3-week follow-up, shortness of breath  CHEST - 2 VIEW  Comparison: 09/20/2011  Findings: Moderate left pleural effusion, increased. Small right  pleural effusion, decreased.  Lungs are otherwise clear. No pneumothorax.  The heart is top normal in size. Postsurgical changes related to  prior CABG. Prosthetic aortic valve.  Left subclavian pacemaker.  Vascular calcifications.  IMPRESSION:  Moderate left pleural effusion, increased. Small right pleural  effusion, decreased.  No pneumothorax.  Original Report Authenticated By: Julian Hy, M.D.    Impression:  Patient is slowly improving following aortic valve replacement and coronary artery bypass grafting. She does have a small to  moderate-sized left pleural effusion which may be slightly larger on today's x-ray. She's not having shortness of breath, and as such I don't feel that the effusion is large enough to warrant drainage at this time. Overall the patient seems to be gradually getting better although she is probably not ready to go home to independent living.  Plan:  We have not made any changes in the patient's current medications. I've encouraged patient to continue to increase her physical activity and to make an effort to work with physical rehabilitation team at her nursing facility. We will plan to see her back in 2 months with a followup chest x-ray to make sure that her pleural effusion is resolving. Have reminded her to schedule an appointment to see Dr. Aundra Dubin within the next few weeks for routine followup. All of her questions have been addressed.   Valentina Gu. Roxy Manns, MD 10/09/2011 12:09 PM

## 2011-10-10 ENCOUNTER — Telehealth: Payer: Self-pay | Admitting: *Deleted

## 2011-10-10 NOTE — Telephone Encounter (Signed)
PER NOTE FROM DR [facility] F/U  SCHEDULED FOR  11-15-11 AT 9:00 AM  PT AWARE .Adonis Housekeeper

## 2011-10-11 ENCOUNTER — Encounter (HOSPITAL_COMMUNITY): Payer: Self-pay | Admitting: Emergency Medicine

## 2011-10-11 ENCOUNTER — Emergency Department (HOSPITAL_COMMUNITY)
Admission: EM | Admit: 2011-10-11 | Discharge: 2011-10-11 | Disposition: A | Discharge status: Home / Self Care | Payer: Medicare Other | Attending: Emergency Medicine | Admitting: Emergency Medicine

## 2011-10-11 ENCOUNTER — Other Ambulatory Visit: Payer: Self-pay

## 2011-10-11 ENCOUNTER — Emergency Department (HOSPITAL_COMMUNITY): Payer: Medicare Other

## 2011-10-11 DIAGNOSIS — R0602 Shortness of breath: Secondary | ICD-10-CM | POA: Insufficient documentation

## 2011-10-11 DIAGNOSIS — R42 Dizziness and giddiness: Secondary | ICD-10-CM | POA: Insufficient documentation

## 2011-10-11 DIAGNOSIS — I25810 Atherosclerosis of coronary artery bypass graft(s) without angina pectoris: Secondary | ICD-10-CM | POA: Insufficient documentation

## 2011-10-11 DIAGNOSIS — Z954 Presence of other heart-valve replacement: Secondary | ICD-10-CM | POA: Insufficient documentation

## 2011-10-11 DIAGNOSIS — R5381 Other malaise: Secondary | ICD-10-CM | POA: Insufficient documentation

## 2011-10-11 LAB — PROTIME-INR: INR: 0.96 (ref 0.00–1.49)

## 2011-10-11 LAB — CBC WITH DIFFERENTIAL/PLATELET
Eosinophils Absolute: 0.2 10*3/uL (ref 0.0–0.7)
Eosinophils Relative: 1 % (ref 0–5)
HCT: 35.6 % — ABNORMAL LOW (ref 36.0–46.0)
Hemoglobin: 11.8 g/dL — ABNORMAL LOW (ref 12.0–15.0)
Lymphs Abs: 2.2 10*3/uL (ref 0.7–4.0)
MCH: 30.3 pg (ref 26.0–34.0)
MCV: 91.3 fL (ref 78.0–100.0)
Monocytes Absolute: 1.2 10*3/uL — ABNORMAL HIGH (ref 0.1–1.0)
Monocytes Relative: 10 % (ref 3–12)
RBC: 3.9 MIL/uL (ref 3.87–5.11)

## 2011-10-11 LAB — BASIC METABOLIC PANEL
BUN: 33 mg/dL — ABNORMAL HIGH (ref 6–23)
Calcium: 9.5 mg/dL (ref 8.4–10.5)
GFR calc non Af Amer: 52 mL/min — ABNORMAL LOW (ref >90)
Glucose, Bld: 193 mg/dL — ABNORMAL HIGH (ref 70–99)

## 2011-10-11 MED ORDER — ASPIRIN 81 MG PO CHEW: 324.0000 mg | CHEWABLE_TABLET | Freq: Once | ORAL | Status: DC

## 2011-10-11 NOTE — ED Notes (Signed)
D/c'd Barbara Garner place today; was there for rehab quad bypass on 09/01/11. Sudden onset weakness and sob. Vague feeling bad.

## 2011-10-11 NOTE — ED Provider Notes (Signed)
History     CSN: [id number]  Arrival date & time 10/11/11  1842   First MD Initiated Contact with Patient 10/11/11 1859      Chief Complaint  Patient presents with  . Fatigue    (Consider location/radiation/quality/duration/timing/severity/associated sxs/prior treatment) Patient is a 77 y.o. female presenting with shortness of breath. The history is provided by the patient.  Shortness of Breath  The current episode started today. The onset was sudden. The problem occurs rarely. The problem has been resolved. The problem is mild. The symptoms are relieved by rest. Nothing aggravates the symptoms. Associated symptoms include shortness of breath. Pertinent negatives include no chest pain, no chest pressure, no orthopnea, no fever, no rhinorrhea, no sore throat, no stridor, no cough and no wheezing. She has been behaving normally. Urine output has been normal. Recently, medical care has been given by a specialist. Services received include medications given and tests performed.    Past Medical History  Diagnosis Date  . Iron deficiency anemia, unspecified   . Chronic diastolic CHF (congestive heart failure)   . CAD (coronary artery disease)     a.  Unstable angina in 1997 with PTCA of large D1;  b. [facility]9/10[facility] showed EF 84%, stable perfusion images with no ischemia.;  c. Cath 07/2011 - Severe Diffuse 3VD  . Type II or unspecified type diabetes mellitus without mention of complication, not stated as uncontrolled   . Contact dermatitis and other eczema, due to unspecified cause   . Esophageal reflux   . Headache   . Personal history of unspecified circulatory disease   . Pure hypercholesterolemia   . Unspecified essential hypertension   . Unspecified hypothyroidism   . Other specified erythematous condition   . Memory loss   . Unspecified hereditary and idiopathic peripheral neuropathy   . LBBB (left bundle branch block)   . Carotid stenosis     Carotid dopplers 0000000  with [id number] LICA stenosis  . Severe aortic stenosis     a. 05/2011 Echo:  EF 55-65%, Gr1DD, Sev AS, Mild AI, PASP 14mmHg.  Marland Kitchen Extrinsic asthma, unspecified     hasn't used inhaler in past year  . CKD (chronic kidney disease) stage 4, GFR 15-29 ml/min     Cr 1.6 in 6/11, sees Dr. Arty Baumgartner  . Impaired vision     pt. reports that she identifies her meds by looking at the pillls, she is not able to read labels on bottles  . S/P aortic valve replacement 09/06/2011    19 mm [facility] Ease pericardial tissue valve  . S/P CABG x 3 09/06/2011    LIMA to diagonal branch, SVG to OM1, SVG to PDA, EVH via bilateral thighs    Past Surgical History  Procedure Date  . Ptca [phone]    5 blockages  . Cardiac catheterization   . Esophageal dilation 1997  . Colonoscopy 04/1999    1 polyp  . Esophagogastroduodenoscopy 04/1999    HH; "watermelon stomach" (severe gastritis)  . Dexa 09/2005    Osteopenia  . Cataract extraction 2003    Bilateral  . Esophagogastroduodenoscopy 04/2002    Gastritis  . Cardiovascular stress test 10/2002    normal (per patient)  . Abdominal hysterectomy 1977    partial; fibroids  . Bladder tack 1977  . Dexa 10/2003    osteoporosis  . Colonoscopy 06/2004    Neg. Int hem  . Carotid dopplers 2006  . Admit 09/2005    n/v;  renal failure  . Refractive surgery 2006  . Opthy 11/1998;12/01;11/02  . Aortic valve replacement 09/06/2011    Procedure: AORTIC VALVE REPLACEMENT (AVR);  Surgeon: Rexene Alberts, MD;  Location: [place];  Service: Open Heart Surgery;  Laterality: N/A;  . Coronary artery bypass graft 09/06/2011    Procedure: CORONARY ARTERY BYPASS GRAFTING (CABG);  Surgeon: Rexene Alberts, MD;  Location: [place];  Service: Open Heart Surgery;  Laterality: N/A;    Family History  Problem Relation Age of Onset  . Stroke Father     died in his 88's  . Stroke Mother     died in her 58's    History  Substance Use Topics  . Smoking status: Never Smoker   . Smokeless  tobacco: Not on file  . Alcohol Use: No    OB History    Grav Para Term Preterm Abortions TAB SAB Ect Mult Living                  Review of Systems  Constitutional: Negative for fever, chills, diaphoresis and fatigue.  HENT: Negative for ear pain, congestion, sore throat, facial swelling, rhinorrhea, mouth sores, trouble swallowing, neck pain and neck stiffness.   Eyes: Negative.   Respiratory: Positive for shortness of breath. Negative for apnea, cough, chest tightness, wheezing and stridor.   Cardiovascular: Negative for chest pain, palpitations, orthopnea and leg swelling.  Gastrointestinal: Negative for nausea, vomiting, abdominal pain, diarrhea and abdominal distention.  Genitourinary: Negative for hematuria, flank pain, vaginal discharge, difficulty urinating and menstrual problem.  Musculoskeletal: Negative for back pain and gait problem.  Skin: Negative for rash and wound.  Neurological: Positive for dizziness, weakness (described as feeling fatigued) and light-headedness. Negative for tremors, seizures, syncope, facial asymmetry, numbness and headaches.  Psychiatric/Behavioral: Negative.   All other systems reviewed and are negative.    Allergies  Metformin and Promethazine hcl  Home Medications   Current Outpatient Rx  Name Route Sig Dispense Refill  . ALBUTEROL SULFATE HFA 108 (90 BASE) MCG/ACT IN AERS Inhalation Inhale 2 puffs into the lungs every 4 (four) hours as needed. For shortness of breath.    . AMLODIPINE BESYLATE 10 MG PO TABS Oral Take 10 mg by mouth daily.     . ASPIRIN 325 MG PO TBEC Oral Take 325 mg by mouth daily.      . ATORVASTATIN CALCIUM 40 MG PO TABS Oral Take 40 mg by mouth daily.    Marland Kitchen CARVEDILOL 12.5 MG PO TABS Oral Take 1 tablet (12.5 mg total) by mouth 2 (two) times daily with a meal. 60 tablet 1  . GLUCERNA SHAKE PO LIQD Oral Take 237 mLs by mouth 3 (three) times daily between meals.    . FUROSEMIDE 20 MG PO TABS Oral Take 20 mg by mouth  daily.     Marland Kitchen GLIPIZIDE ER 10 MG PO TB24 Oral Take 10 mg by mouth 2 (two) times daily.    Marland Kitchen HYDROCODONE-ACETAMINOPHEN 5-500 MG PO CAPS Oral Take 1 capsule by mouth every 6 (six) hours as needed.    Marland Kitchen LEVOTHYROXINE SODIUM 50 MCG PO TABS Oral Take 50 mcg by mouth daily.    Marland Kitchen MICONAZOLE NITRATE 2 % EX POWD Topical Apply topically 2 (two) times daily. Apply under breasts twice daily for fungal rash    . OMEPRAZOLE 20 MG PO CPDR Oral Take 20 mg by mouth daily.    Marland Kitchen PHOSPHATE ENEMA 7-19 GM/118ML RE ENEM Rectal Place 1 enema rectally once  as needed.    Marland Kitchen POTASSIUM CHLORIDE CRYS ER 20 MEQ PO TBCR Oral Take 20 mEq by mouth daily.    . SENNA 8.6 MG PO TABS Oral Take 1 tablet by mouth daily.    Marland Kitchen ZOLPIDEM TARTRATE 5 MG PO TABS Oral Take 5 mg by mouth at bedtime as needed.      BP 153/63  Pulse 79  Temp 98.2 F (36.8 C) (Oral)  Resp 17  SpO2 97%  Physical Exam  Nursing note and vitals reviewed. Constitutional: She is oriented to person, place, and time. She appears well-developed and well-nourished. No distress.  HENT:  Head: Normocephalic and atraumatic.  Right Ear: External ear normal.  Left Ear: External ear normal.  Nose: Nose normal.  Mouth/Throat: Oropharynx is clear and moist. No oropharyngeal exudate.  Eyes: Conjunctivae and EOM are normal. Pupils are equal, round, and reactive to light. Right eye exhibits no discharge. Left eye exhibits no discharge.  Neck: Normal range of motion. Neck supple. No JVD present. No tracheal deviation present. No thyromegaly present.  Cardiovascular: Normal rate, regular rhythm, normal heart sounds and intact distal pulses.  Exam reveals no gallop and no friction rub.   No murmur heard. Pulmonary/Chest: Effort normal and breath sounds normal. No respiratory distress. She has no wheezes. She has no rales. She exhibits no tenderness.       Midline surgical incision of chest from recent CABG and aortic valve replacement  Abdominal: Soft. Bowel sounds are  normal. She exhibits no distension. There is no tenderness. There is no rebound and no guarding.  Musculoskeletal: Normal range of motion.  Lymphadenopathy:    She has no cervical adenopathy.  Neurological: She is alert and oriented to person, place, and time. No cranial nerve deficit. Coordination normal.  Skin: Skin is warm. No rash noted. She is not diaphoretic.  Psychiatric: She has a normal mood and affect. Her behavior is normal. Judgment and thought content normal.    ED Course  Procedures (including critical care time)   Labs Reviewed  CBC WITH DIFFERENTIAL  BASIC METABOLIC PANEL  TROPONIN I  PROTIME-INR  URINALYSIS, ROUTINE W REFLEX MICROSCOPIC   No results found.   No diagnosis found.    MDM   77 year old female patient with past medical history diabetes coronary artery disease heart failure aortic stenosis left bundle branch block recently discharged from a rehabilitation facility after having coronary artery bypass grafting and aortic valve replacement 4 weeks ago presents with a 20 minute episode of shortness of breath global weakness and lightheadedness sensation. Patient says that she only been home 15 minutes from the rehabilitation center when she stood up walked to the door and fell the symptoms. They lasted 20 minutes and resolved on her head and now patient is asymptomatic. Patient has normal physical exam above with no signs of heart failure no signs of ischemia on EKG. Patient is high risk for morbidity and pus chest x-ray cardiac enzymes labs urinalysis been ordered as worsening cardiovascular or infectious issues could have contributed to her presyncope feelings. Though as patient looks well now and is hemodynamically stable given the description of her symptoms other etiologies such as orthostatic hypotension are likely   Date: 10/11/2011  Rate: 84  Rhythm: normal sinus rhythm  QRS Axis: normal  Intervals: normal  ST/T Wave abnormalities: normal   Conduction Disutrbances:left bundle branch block  Narrative Interpretation:   Old EKG Reviewed: unchanged  Results for orders placed during the hospital encounter of 10/11/11  CBC WITH DIFFERENTIAL      Component Value Range   WBC 11.9 (*) 4.0 - 10.5 K/uL   RBC 3.90  3.87 - 5.11 MIL/uL   Hemoglobin 11.8 (*) 12.0 - 15.0 g/dL   HCT 35.6 (*) 36.0 - 46.0 %   MCV 91.3  78.0 - 100.0 fL   MCH 30.3  26.0 - 34.0 pg   MCHC 33.1  30.0 - 36.0 g/dL   RDW 14.0  11.5 - 15.5 %   Platelets 284  150 - 400 K/uL   Neutrophils Relative 70  43 - 77 %   Neutro Abs 8.4 (*) 1.7 - 7.7 K/uL   Lymphocytes Relative 18  12 - 46 %   Lymphs Abs 2.2  0.7 - 4.0 K/uL   Monocytes Relative 10  3 - 12 %   Monocytes Absolute 1.2 (*) 0.1 - 1.0 K/uL   Eosinophils Relative 1  0 - 5 %   Eosinophils Absolute 0.2  0.0 - 0.7 K/uL   Basophils Relative 1  0 - 1 %   Basophils Absolute 0.1  0.0 - 0.1 K/uL  BASIC METABOLIC PANEL      Component Value Range   Sodium 139  135 - 145 mEq/L   Potassium 4.1  3.5 - 5.1 mEq/L   Chloride 98  96 - 112 mEq/L   CO2 28  19 - 32 mEq/L   Glucose, Bld 193 (*) 70 - 99 mg/dL   BUN 33 (*) 6 - 23 mg/dL   Creatinine, Ser 1.02  0.50 - 1.10 mg/dL   Calcium 9.5  8.4 - 10.5 mg/dL   GFR calc non Af Amer 52 (*) >90 mL/min   GFR calc Af Amer 61 (*) >90 mL/min  TROPONIN I      Component Value Range   Troponin I <0.30  <0.30 ng/mL  PROTIME-INR      Component Value Range   Prothrombin Time 13.0  11.6 - 15.2 seconds   INR 0.96  0.00 - 1.49   DG Chest 2 View (Final result)   Result time:10/11/11 2030    Final result by Rad Results In Interface (10/11/11 20:30:22)    Narrative:   *RADIOLOGY REPORT*  Clinical Data: Bilateral pleural effusions. Fatigue.  CHEST - 2 VIEW  Comparison: 10/09/2011  Findings: The bilateral pleural effusions are essentially unchanged. Pulmonary vascularity is normal. No infiltrates.  Evidence of previous CABG and aortic valve replacement. Large hiatal  hernia.  IMPRESSION: No change since the prior exam. Persistent bilateral pleural effusions.  Original Report Authenticated By: Larey Seat, M.D.     Patient is symptomatically emergency department for 2 hours. Patient able to ambulate without shortness of breath or lightheadedness. Patient with normal workup as above with negative troponins no chest pain. Given her prolonged stay in the hospital I think this represents orthostatic hypotension and deconditioning especially after she says she felt warm after walking and stood up abruptly. Patient would like to go home and call her cardiologist morning. Case discussed with Dr. Joyice Faster, MD 10/11/11 [phone]

## 2011-10-11 NOTE — ED Notes (Signed)
PT AAO X 3, NAD, BREATHING EVEN AND UNLABORED, SKIN WARM AND DRY, AND NO QUESTIONS AT THIS TIME.

## 2011-10-12 ENCOUNTER — Telehealth: Payer: Self-pay | Admitting: Cardiology

## 2011-10-12 NOTE — Telephone Encounter (Signed)
LMTCB

## 2011-10-12 NOTE — Telephone Encounter (Signed)
Spoke with pt's son, Tommie Raymond. Pt to ED last night. Son states pt was told everything OK at ED  But to  call our office for an appt. Pt's son states that pt is fine this morning and does not complain of any SOB. I offered pt appt today but son declined. An appt has been scheduled for pt 10/17/11 with Richardson Dopp. Son agreed with this appt.

## 2011-10-12 NOTE — Telephone Encounter (Signed)
F/u   Patient son returning nurse call, he can be reached at [phone].

## 2011-10-12 NOTE — ED Provider Notes (Signed)
I saw and evaluated the patient, reviewed the resident's note and I agree with the findings and plan. I personally evaluated the ECG and agree with the interpretation of the resident  The patient's discomfort was transient.  Her labs EKG and chest x-ray normal emergency department.  She was discharged home to call her cardiologist tomorrow for followup.  I do not believe she needs hospitalization additional testing at this time.  1. Shortness of breath      Hoy Morn, MD 10/12/11 [phone]

## 2011-10-12 NOTE — Telephone Encounter (Signed)
New msg Pt's son called and was told to call this morning

## 2011-10-17 ENCOUNTER — Encounter: Payer: Medicare Other | Admitting: Physician Assistant

## 2011-10-30 ENCOUNTER — Encounter: Payer: Self-pay | Admitting: Family Medicine

## 2011-10-30 ENCOUNTER — Ambulatory Visit (INDEPENDENT_AMBULATORY_CARE_PROVIDER_SITE_OTHER): Payer: Medicare Other | Admitting: Family Medicine

## 2011-10-30 VITALS — BP 122/77 | HR 76 | Temp 97.8°F | Ht 60.0 in | Wt 101.0 lb

## 2011-10-30 DIAGNOSIS — R413 Other amnesia: Secondary | ICD-10-CM

## 2011-10-30 DIAGNOSIS — H6123 Impacted cerumen, bilateral: Secondary | ICD-10-CM | POA: Insufficient documentation

## 2011-10-30 DIAGNOSIS — H6120 Impacted cerumen, unspecified ear: Secondary | ICD-10-CM

## 2011-10-30 DIAGNOSIS — E119 Type 2 diabetes mellitus without complications: Secondary | ICD-10-CM

## 2011-10-30 NOTE — Progress Notes (Signed)
Subjective:    Patient ID: Barbara Garner, female    DOB: [birth year], 77 y.o.   MRN: [id number]  HPI [facility] wanted to address blood sugar   Labs in June  Lab Results  Component Value Date   HGBA1C 6.7* 09/04/2011     Few ups and down with sugar  Checks her sugar twice daily  Some PM sugars are high 20s-300s and some am sugars are in 80s-90s Is not a big eater    Has been eating sandwhiches - egg salad mostly  Sausage egg biscuits  Tends to just eat half  Appetite down with her age  Is as active  Has never been to diabetic classes   Family member fixes her meals  In house nurse will be bringing her diet - [facility] Family worried about her memory and poss dementia Pt says she has "selective memory" Denies depression   Also loosing her vision - that makes it worse  Per her eye doctor - DM retinoppathy   Also L ear is stopped up - tried ear wax flush at home  Wants to irrigate it here No pain   Patient Active Problem List  Diagnosis  . HYPOTHYROIDISM  . DIABETES MELLITUS, TYPE II  . HYPERCHOLESTEROLEMIA, PURE  . ANEMIA-IRON DEFICIENCY  . PERIPHERAL NEUROPATHY  . HYPERTENSION  . MYOCARDIAL INFARCTION, HX OF  . CORONARY ARTERY DISEASE  . CONGESTIVE HEART FAILURE  . ASTHMA  . GERD  . RENAL FAILURE, ACUTE W/TUBULAR NECROSIS LSN  . MEMORY LOSS  . HEADACHE  . PNEUMONIA, HX OF  . INTERTRIGO, CANDIDAL  . ECZEMA  . Neoplasm of uncertain behavior of skin  . Squamous cell carcinoma of skin  . Vaginitis  . CKD (chronic kidney disease)  . Carotid stenosis  . Severe aortic stenosis  . LBBB (left bundle branch block)  . CKD (chronic kidney disease) stage 4, GFR 15-29 ml/min  . CAD (coronary artery disease)  . Coronary atherosclerosis of native coronary artery  . Aortic stenosis  . S/P aortic valve replacement  . S/P CABG x 3  . Complete heart block  . Atrial fibrillation  . Pacemaker-Medtr[employer]  . Impacted cerumen of both ears   Past Medical History  Diagnosis  Date  . Iron deficiency anemia, unspecified   . Chronic diastolic CHF (congestive heart failure)   . CAD (coronary artery disease)     a.  Unstable angina in 1997 with PTCA of large D1;  b. [facility]10[facility] showed EF 84%, stable perfusion images with no ischemia.;  c. Cath 07/2011 - Severe Diffuse 3VD  . Type II or unspecified type diabetes mellitus without mention of complication, not stated as uncontrolled   . Contact dermatitis and other eczema, due to unspecified cause   . Esophageal reflux   . Headache   . Personal history of unspecified circulatory disease   . Pure hypercholesterolemia   . Unspecified essential hypertension   . Unspecified hypothyroidism   . Other specified erythematous condition   . Memory loss   . Unspecified hereditary and idiopathic peripheral neuropathy   . LBBB (left bundle branch block)   . Carotid stenosis     Carotid dopplers 0000000 with [id number] LICA stenosis  . Severe aortic stenosis     a. 05/2011 Echo:  EF 55-65%, Gr1DD, Sev AS, Mild AI, PASP 52mmHg.  Marland Kitchen Extrinsic asthma, unspecified     hasn't used inhaler in past year  . CKD (chronic kidney disease) stage 4, GFR  15-29 ml/min     Cr 1.6 in 6/11, sees Dr. Arty Baumgartner  . Impaired vision     pt. reports that she identifies her meds by looking at the pillls, she is not able to read labels on bottles  . S/P aortic valve replacement 09/06/2011    19 mm [facility] Ease pericardial tissue valve  . S/P CABG x 3 09/06/2011    LIMA to diagonal branch, SVG to OM1, SVG to PDA, EVH via bilateral thighs   Past Surgical History  Procedure Date  . Ptca [phone]    5 blockages  . Cardiac catheterization   . Esophageal dilation 1997  . Colonoscopy 04/1999    1 polyp  . Esophagogastroduodenoscopy 04/1999    HH; "watermelon stomach" (severe gastritis)  . Dexa 09/2005    Osteopenia  . Cataract extraction 2003    Bilateral  . Esophagogastroduodenoscopy 04/2002    Gastritis  . Cardiovascular stress test  10/2002    normal (per patient)  . Abdominal hysterectomy 1977    partial; fibroids  . Bladder tack 1977  . Dexa 10/2003    osteoporosis  . Colonoscopy 06/2004    Neg. Int hem  . Carotid dopplers 2006  . Admit 09/2005    n/v; renal failure  . Refractive surgery 2006  . Opthy 11/1998;12/01;11/02  . Aortic valve replacement 09/06/2011    Procedure: AORTIC VALVE REPLACEMENT (AVR);  Surgeon: Rexene Alberts, MD;  Location: [facility];  Service: Open Heart Surgery;  Laterality: N/A;  . Coronary artery bypass graft 09/06/2011    Procedure: CORONARY ARTERY BYPASS GRAFTING (CABG);  Surgeon: Rexene Alberts, MD;  Location: [facility];  Service: Open Heart Surgery;  Laterality: N/A;   History  Substance Use Topics  . Smoking status: Never Smoker   . Smokeless tobacco: Not on file  . Alcohol Use: No   Family History  Problem Relation Age of Onset  . Stroke Father     died in his 73's  . Stroke Mother     died in her 77's   Allergies  Allergen Reactions  . Metformin     REACTION: intolerant  . Promethazine Hcl     REACTION: u/k   Current Outpatient Prescriptions on File Prior to Visit  Medication Sig Dispense Refill  . amLODipine (NORVASC) 10 MG tablet Take 10 mg by mouth daily.       Marland Kitchen atorvastatin (LIPITOR) 40 MG tablet Take 40 mg by mouth daily.      . carvedilol (COREG) 12.5 MG tablet Take 12.5 mg by mouth 2 (two) times daily with a meal.      . furosemide (LASIX) 20 MG tablet Take 20 mg by mouth daily.       Marland Kitchen glipiZIDE (GLUCOTROL XL) 10 MG 24 hr tablet Take 10 mg by mouth 2 (two) times daily.      Marland Kitchen HYDROcodone-acetaminophen (VICODIN) 5-500 MG per tablet Take 1 tablet by mouth every 6 (six) hours as needed.      Marland Kitchen levothyroxine (SYNTHROID, LEVOTHROID) 50 MCG tablet Take 50 mcg by mouth daily.      . potassium chloride SA (K-DUR,KLOR-CON) 20 MEQ tablet Take 20 mEq by mouth daily.      Marland Kitchen senna (SENOKOT) 8.6 MG TABS Take 1 tablet by mouth daily.      Marland Kitchen zolpidem (AMBIEN) 5 MG tablet Take 5 mg  by mouth at bedtime as needed. For sleep      . albuterol (PROVENTIL HFA;VENTOLIN HFA) 108 (90  BASE) MCG/ACT inhaler Inhale 2 puffs into the lungs every 4 (four) hours as needed. For shortness of breath.      Marland Kitchen aspirin 325 MG EC tablet Take 325 mg by mouth daily.        . miconazole (MICOTIN) 2 % powder Apply topically 2 (two) times daily. Apply under breasts twice daily for fungal rash      . omeprazole (PRILOSEC) 20 MG capsule Take 20 mg by mouth daily.      . phosphate (FLEET) enema Place 1 enema rectally once as needed. For constipation             Review of Systems Review of Systems  Constitutional: Negative for fever, appetite change, fatigue and unexpected weight change.  Eyes: Negative for pain and visual disturbance.  ENT pos for poor hearing, neg for pain or ear d/c Respiratory: Negative for cough and shortness of breath.   Cardiovascular: Negative for cp or palpitations   (overall much better after her aortic valve replacement) Gastrointestinal: Negative for nausea, diarrhea and constipation.  Genitourinary: Negative for urgency and frequency.  Skin: Negative for pallor or rash   Neurological: Negative for weakness, light-headedness, numbness and headaches.  Hematological: Negative for adenopathy. Does not bruise/bleed easily.  Psychiatric/Behavioral: Negative for dysphoric mood. The patient is not nervous/anxious.         Objective:   Physical Exam  Constitutional: She appears well-developed and well-nourished. No distress.       Frail appearing elderly female in no distress  HENT:  Head: Normocephalic and atraumatic.  Mouth/Throat: Oropharynx is clear and moist.       Bilateral deep cerumen impaction Unable to remove with simple irrigation today  Eyes: Conjunctivae and EOM are normal. Pupils are equal, round, and reactive to light. No scleral icterus.  Neck: Normal range of motion. Neck supple. No JVD present. Carotid bruit is not present. No thyromegaly present.    Cardiovascular: Normal rate, regular rhythm and intact distal pulses.   Murmur heard. Pulmonary/Chest: Breath sounds normal. No respiratory distress. She has no wheezes.       No crackles   Abdominal: Soft. Bowel sounds are normal. She exhibits no distension, no abdominal bruit and no mass. There is no tenderness.  Musculoskeletal: She exhibits no edema and no tenderness.  Lymphadenopathy:    She has no cervical adenopathy.  Neurological: She is alert. She has normal reflexes.  Skin: Skin is warm and dry. No rash noted. No erythema. No pallor.  Psychiatric: She has a normal mood and affect.          Assessment & Plan:

## 2011-10-30 NOTE — Patient Instructions (Addendum)
Labs for memory loss today Follow up in 1 month - 30 minute appt  Keep tracking blood sugars Eat a diabetic diet  Try debrox ear solution over the counter -- as directed (to soften wax) Use at least twice per week  Will try to flush ears again in 1 month

## 2011-10-31 LAB — CBC WITH DIFFERENTIAL/PLATELET
Basophils Relative: 0.5 % (ref 0.0–3.0)
Eosinophils Absolute: 0.1 10*3/uL (ref 0.0–0.7)
Lymphocytes Relative: 19.6 % (ref 12.0–46.0)
MCHC: 32.4 g/dL (ref 30.0–36.0)
Neutrophils Relative %: 70.2 % (ref 43.0–77.0)
RBC: 3.89 Mil/uL (ref 3.87–5.11)
WBC: 7.4 10*3/uL (ref 4.5–10.5)

## 2011-10-31 LAB — COMPREHENSIVE METABOLIC PANEL
AST: 20 U/L (ref 0–37)
Albumin: 3.9 g/dL (ref 3.5–5.2)
BUN: 29 mg/dL — ABNORMAL HIGH (ref 6–23)
Calcium: 8.9 mg/dL (ref 8.4–10.5)
Chloride: 101 mEq/L (ref 96–112)
Glucose, Bld: 123 mg/dL — ABNORMAL HIGH (ref 70–99)
Potassium: 4.4 mEq/L (ref 3.5–5.1)

## 2011-10-31 LAB — TSH: TSH: 4.81 u[IU]/mL (ref 0.35–5.50)

## 2011-10-31 LAB — SEDIMENTATION RATE: Sed Rate: 48 mm/hr — ABNORMAL HIGH (ref 0–22)

## 2011-10-31 LAB — VITAMIN B12: Vitamin B-12: 293 pg/mL (ref 211–911)

## 2011-11-01 NOTE — Assessment & Plan Note (Signed)
Attempted irrigation did not work  Pt inst to use debrox to loosen cerumen twice weekly and f/u in about a month to try again In meantime inst if pain or other symptoms to update

## 2011-11-01 NOTE — Assessment & Plan Note (Signed)
It sounds like sugars are labile though last a1c was ok  Disc imp of low glycemic diet Will continue to monitor bid  a1c will be in 43mo

## 2011-11-01 NOTE — Assessment & Plan Note (Signed)
Although pt seemed mentally sharp today (esp in light of recent cardiac surg)- her family is worried about short term memory loss She denies depression and spirits are up Will do some labs for memory loss today and f/u 77mo to discuss

## 2011-11-09 ENCOUNTER — Ambulatory Visit: Payer: Medicare Other | Admitting: Cardiology

## 2011-11-13 ENCOUNTER — Other Ambulatory Visit: Payer: Self-pay | Admitting: *Deleted

## 2011-11-13 MED ORDER — HYDROCODONE-ACETAMINOPHEN 5-500 MG PO TABS: 1.0000 | ORAL_TABLET | Freq: Four times a day (QID) | ORAL | Status: DC | PRN

## 2011-11-13 MED ORDER — FUROSEMIDE 20 MG PO TABS: 20.0000 mg | ORAL_TABLET | Freq: Every day | ORAL | Status: DC

## 2011-11-13 MED ORDER — POTASSIUM CHLORIDE CRYS ER 20 MEQ PO TBCR: 20.0000 meq | EXTENDED_RELEASE_TABLET | Freq: Every day | ORAL | Status: DC

## 2011-11-13 MED ORDER — GLIPIZIDE ER 10 MG PO TB24: 10.0000 mg | ORAL_TABLET | Freq: Two times a day (BID) | ORAL | Status: DC

## 2011-11-13 MED ORDER — ZOLPIDEM TARTRATE 5 MG PO TABS: 5.0000 mg | ORAL_TABLET | Freq: Every evening | ORAL | Status: DC | PRN

## 2011-11-13 MED ORDER — ATORVASTATIN CALCIUM 40 MG PO TABS: 40.0000 mg | ORAL_TABLET | Freq: Every day | ORAL | Status: DC

## 2011-11-13 MED ORDER — CARVEDILOL 12.5 MG PO TABS: 12.5000 mg | ORAL_TABLET | Freq: Two times a day (BID) | ORAL | Status: DC

## 2011-11-13 NOTE — Telephone Encounter (Signed)
Px written for call in   

## 2011-11-13 NOTE — Telephone Encounter (Signed)
OK to refill

## 2011-11-13 NOTE — Telephone Encounter (Signed)
Medications phoned to pharmacy.

## 2011-11-15 ENCOUNTER — Encounter: Payer: Self-pay | Admitting: Cardiology

## 2011-11-15 ENCOUNTER — Ambulatory Visit (INDEPENDENT_AMBULATORY_CARE_PROVIDER_SITE_OTHER): Payer: Medicare Other | Admitting: Cardiology

## 2011-11-15 ENCOUNTER — Other Ambulatory Visit: Payer: Self-pay

## 2011-11-15 VITALS — BP 176/70 | HR 65 | Ht 60.0 in | Wt 107.0 lb

## 2011-11-15 DIAGNOSIS — R0602 Shortness of breath: Secondary | ICD-10-CM

## 2011-11-15 DIAGNOSIS — E7800 Pure hypercholesterolemia, unspecified: Secondary | ICD-10-CM

## 2011-11-15 DIAGNOSIS — I10 Essential (primary) hypertension: Secondary | ICD-10-CM

## 2011-11-15 DIAGNOSIS — I25810 Atherosclerosis of coronary artery bypass graft(s) without angina pectoris: Secondary | ICD-10-CM

## 2011-11-15 DIAGNOSIS — I359 Nonrheumatic aortic valve disorder, unspecified: Secondary | ICD-10-CM

## 2011-11-15 DIAGNOSIS — I2510 Atherosclerotic heart disease of native coronary artery without angina pectoris: Secondary | ICD-10-CM

## 2011-11-15 DIAGNOSIS — I6529 Occlusion and stenosis of unspecified carotid artery: Secondary | ICD-10-CM

## 2011-11-15 DIAGNOSIS — I5032 Chronic diastolic (congestive) heart failure: Secondary | ICD-10-CM | POA: Insufficient documentation

## 2011-11-15 DIAGNOSIS — I509 Heart failure, unspecified: Secondary | ICD-10-CM

## 2011-11-15 DIAGNOSIS — I350 Nonrheumatic aortic (valve) stenosis: Secondary | ICD-10-CM

## 2011-11-15 DIAGNOSIS — I442 Atrioventricular block, complete: Secondary | ICD-10-CM

## 2011-11-15 MED ORDER — LISINOPRIL 5 MG PO TABS: 5.0000 mg | ORAL_TABLET | Freq: Every day | ORAL | Status: DC

## 2011-11-15 MED ORDER — CARVEDILOL 12.5 MG PO TABS: ORAL_TABLET | ORAL | Status: DC

## 2011-11-15 NOTE — Progress Notes (Signed)
Patient ID: Barbara Garner, female   DOB: 13-Sep-1934, 77 y.o.   MRN: [id number] PCP: Dr. Glori Bickers  77 yo with history of CAD s/p unstable angina/PTCA to D1 in 1997, CKD, and aortic stenosis presents for cardiology followup after CABG/AVR and PPM placement.  Earlier this year, she started to note the onset of exertional dypsnea.  She was short of breath walking to the mailbox or after walking about 50 yards in general.   She was short of breath walking up steps or an incline.  Echo showed severe AS.   I did a left heart cath in 5/13, showing three vessel disease.  She had CABG x 3 with bioprosthetic AVR in 6/13.  She had heart block post-op with placement of a dual chamber Medtronic PPM.  She was in rehab initially after surgery and now is back at home.  She lives with her son.  She seems to be doing fairly well.  She has some memory deficits for around the time of the surgery.  She has not been very active but denies dyspnea walking around the house or on short trips out of the house.  No chest pain.  No lightheadedness or syncope.  BP is running high.  She is taking both Coreg and metoprolol.  Echo post-op showed well-seated bioprosthetic aortic valve with normal EF.    Labs (6/11): K 3.4, creatinine 1.6, LDL 76, HDL 47, TSH normal  Labs (1/12): K 3.9, creatinine 1.7 Labs (11/12): TSH normal Labs (3/13): K 4.4, creatinine 1.6, LDL 79, HDL 52 Labs (4/13): K 4.5, creatinine 1.55, BNP 133 Labs (8/13): K 4.4, creatinine 1.1  Allergies:  1) ! Phenergan  2) ! Glucophage   Past History:  1. Anemia-iron deficiency  2. Asthma  3. Coronary artery disease: Unstable angina in 1997 with PTCA of large D1. Myoview (9/10) at [facility] cardiology showed EF 84%, stable perfusion images with no ischemia.  LHC (5/13) showed 95% OM1 stenosis, 75% mLAD, 80% ostial large D1, 75% dRCA.  She had LIMA-LAD, SVG-PDA, and SVG-OM at time of AVR in 6/13.  4. Diabetes mellitus, type II  5. GERD  6. Hypertension  7.  Hypothyroidism  8. Pneumonia, hx of  9. CKD: creatinine 1.6 in 6/11  10. Chronic LBBB  11. Aortic stenosis: Severe. Echo (3/13): EF 55-65%, moderately LV hypertrophy, severe AS with mean gradient 40 mmHg/peak gradient 70 mmHg, PA systolic pressure 35 mmHg.  Patient had bioprosthetic AVR in 6/13.  Echo post-op in 6/13 showed mild LVH, EF 65-70%, mean gradient 14 mmHg across bioprosthetic aortic valve.  12. Carotid stenosis: Carotid dopplers 0000000 with [id number] LICA stenosis. Carotid dopplers (3/13) with 40-59% bilateral carotid stenosis.  13. Post-op complete heart block in 6/13 with Medtronic dual chamber PPM.   Family History:  No premature CAD.  Social History:  non smoker  widowed, lives with son between [place] and [place] reviewed and negative except as per HPI.   Current Outpatient Prescriptions  Medication Sig Dispense Refill  . albuterol (PROVENTIL HFA;VENTOLIN HFA) 108 (90 BASE) MCG/ACT inhaler Inhale 2 puffs into the lungs every 4 (four) hours as needed. For shortness of breath.      Marland Kitchen amLODipine (NORVASC) 10 MG tablet Take 10 mg by mouth daily.       Marland Kitchen aspirin 325 MG EC tablet Take 325 mg by mouth daily.        Marland Kitchen atorvastatin (LIPITOR) 40 MG tablet Take 1 tablet (40  mg total) by mouth daily.  30 tablet  3  . furosemide (LASIX) 20 MG tablet Take 1 tablet (20 mg total) by mouth daily.  30 tablet  3  . glipiZIDE (GLUCOTROL XL) 10 MG 24 hr tablet Take 1 tablet (10 mg total) by mouth 2 (two) times daily.  60 tablet  3  . HYDROcodone-acetaminophen (VICODIN) 5-500 MG per tablet Take 1 tablet by mouth every 6 (six) hours as needed.  30 tablet  0  . levothyroxine (SYNTHROID, LEVOTHROID) 50 MCG tablet Take 50 mcg by mouth daily.      . miconazole (MICOTIN) 2 % powder Apply topically 2 (two) times daily. Apply under breasts twice daily for fungal rash      . omeprazole (PRILOSEC) 20 MG capsule Take 20 mg by mouth daily.      . phosphate (FLEET) enema  Place 1 enema rectally once as needed. For constipation      . potassium chloride SA (K-DUR,KLOR-CON) 20 MEQ tablet Take 1 tablet (20 mEq total) by mouth daily.  30 tablet  3  . senna (SENOKOT) 8.6 MG TABS Take 1 tablet by mouth daily.      Marland Kitchen zolpidem (AMBIEN) 5 MG tablet Take 1 tablet (5 mg total) by mouth at bedtime as needed. For sleep  30 tablet  0  . DISCONTD: carvedilol (COREG) 12.5 MG tablet Take 1 tablet (12.5 mg total) by mouth 2 (two) times daily with a meal.  60 tablet  3  . carvedilol (COREG) 12.5 MG tablet 1 and 1/2 tablets (total 18.75mg  ) twice a day  90 tablet  6  . lisinopril (PRINIVIL,ZESTRIL) 5 MG tablet Take 1 tablet (5 mg total) by mouth daily.  90 tablet  3    BP 176/70  Pulse 65  Ht 5' (1.524 m)  Wt 107 lb (48.535 kg)  BMI 20.90 kg/m2  SpO2 98% General: NAD Neck: No JVD, no thyromegaly or thyroid nodule.  Lungs: Clear to auscultation bilaterally with normal respiratory effort. CV: Nondisplaced PMI.  Heart regular S1/S2, no S3/S4, 1/6 early SEM RUSB.  1+ edema 1/2 to knees bilaterally.  Bilateral carotid bruits.   Abdomen: Soft, nontender, no hepatosplenomegaly, no distention.  Neurologic: Alert and oriented x 3.  Psych: Normal affect. Extremities: No clubbing or cyanosis.

## 2011-11-15 NOTE — Assessment & Plan Note (Signed)
CHB post-CABG/AVR.  Has Medtonic PPM, to followup with Dr. Caryl Comes in the next couple weeks.

## 2011-11-15 NOTE — Assessment & Plan Note (Addendum)
No chest pain.  She is now s/p CABG x 3 at time of AVR.  Continue ASA, statin, Coreg.  Will add low dose ACEI today.  She needs to start cardiac rehab, I will refer her.

## 2011-11-15 NOTE — Assessment & Plan Note (Signed)
Goal LDL < 70.  Will have her get fasting lipids.

## 2011-11-15 NOTE — Assessment & Plan Note (Signed)
Moderate bilateral carotid stenosis.  Repeat carotid dopplers 3/14.

## 2011-11-15 NOTE — Assessment & Plan Note (Signed)
Status post AVR.  Well-seated bioprosthetic aortic valve on post-op echo.

## 2011-11-15 NOTE — Telephone Encounter (Signed)
Christie with [facility] ask if Lisinopril is in addition to meds pt already taking or does it take the place of a med. Bennetta Laos to contact Dr Aundra Dubin office. Christie understood.

## 2011-11-15 NOTE — Assessment & Plan Note (Signed)
BP has been running high.  She is on both metoprolol and Coreg.  I will stop metoprolol and increase Coreg to 18.75 mg bid.  I will also add lisinopril 5 mg daily.  Will check BMET in 2 wks.

## 2011-11-15 NOTE — Assessment & Plan Note (Signed)
Minimal volume overload on exam (no JVD).  Continue current dose of Lasix for now.  Will get BNP with labs in 2 wks.

## 2011-11-15 NOTE — Patient Instructions (Addendum)
Stop metoprolol.  Start coreg(carvedilol) 18.75mg  twice a day. This will be one and one-half 12.5mg  tablets twice a day.  Start lisinopril 5mg  daily.  Your physician recommends that you return for a FASTING lipid profile /liver profile/BMET/BNP in 2 weeks.   Dr Aundra Dubin has recommended participation in Cardiac Rehab at [facility].  Your physician recommends that you schedule a follow-up appointment in: 6 weeks with Dr Aundra Dubin.

## 2011-11-27 ENCOUNTER — Other Ambulatory Visit: Payer: Medicare Other

## 2011-12-05 ENCOUNTER — Encounter: Payer: Self-pay | Admitting: Internal Medicine

## 2011-12-05 ENCOUNTER — Ambulatory Visit (INDEPENDENT_AMBULATORY_CARE_PROVIDER_SITE_OTHER): Payer: Medicare Other | Admitting: Internal Medicine

## 2011-12-05 VITALS — BP 126/67 | HR 60 | Ht 62.0 in | Wt 102.8 lb

## 2011-12-05 DIAGNOSIS — I10 Essential (primary) hypertension: Secondary | ICD-10-CM

## 2011-12-05 DIAGNOSIS — I442 Atrioventricular block, complete: Secondary | ICD-10-CM

## 2011-12-05 DIAGNOSIS — I4891 Unspecified atrial fibrillation: Secondary | ICD-10-CM

## 2011-12-05 DIAGNOSIS — Z950 Presence of cardiac pacemaker: Secondary | ICD-10-CM

## 2011-12-05 NOTE — Progress Notes (Signed)
Patient Care Team: Abner Greenspan, MD as PCP - General Blanchie Serve, MD as Attending Physician (Internal Medicine)   HPI  Barbara Garner is a 77 y.o. female Seen following pacemaker implantation June 2013 for complete heart block then became manifest in the setting of bypass surgery and aortic valve replacement for aortic stenosis.   She is doing pretty well without syncope there is some discomfort  Past Medical History  Diagnosis Date  . Iron deficiency anemia, unspecified   . Chronic diastolic CHF (congestive heart failure)   . CAD  S/P CABG x 3 08/2011     LIMA to diagonal branch, SVG to OM1, SVG to PDA, EVH via bilateral thighs  . Type II or unspecified type diabetes mellitus without mention of complication, not stated as uncontrolled   . Contact dermatitis and other eczema, due to unspecified cause   . Esophageal reflux   . Headache   . Pacemaker MDT dual chamber     DOI 08/2011  . Pure hypercholesterolemia   . hypertension   . Hypothyroidism   . Atrioventricular block, complete -intermittent   . Memory loss   . Idiopathic peripheral neuropathy   . LBBB (left bundle branch block)   . Carotid stenosis     Carotid dopplers 0000000 with [id number] LICA stenosis  . Severe aortic stenosis -S/P AVR      19 mm [facility] Ease pericardial tissue valve  . Extrinsic asthma, unspecified     hasn't used inhaler in past year  . CKD (chronic kidney disease) stage 4, GFR 15-29 ml/min     Cr 1.6 in 6/11, sees Dr. Arty Baumgartner  . Impaired vision     pt. reports that she identifies her meds by looking at the pillls, she is not able to read labels on bottles    Past Surgical History  Procedure Date  . Ptca [phone]    5 blockages  . Cardiac catheterization   . Esophageal dilation 1997  . Colonoscopy 04/1999    1 polyp  . Esophagogastroduodenoscopy 04/1999    HH; "watermelon stomach" (severe gastritis)  . Dexa 09/2005    Osteopenia  . Cataract extraction 2003    Bilateral  .  Esophagogastroduodenoscopy 04/2002    Gastritis  . Cardiovascular stress test 10/2002    normal (per patient)  . Abdominal hysterectomy 1977    partial; fibroids  . Bladder tack 1977  . Dexa 10/2003    osteoporosis  . Colonoscopy 06/2004    Neg. Int hem  . Carotid dopplers 2006  . Admit 09/2005    n/v; renal failure  . Refractive surgery 2006  . Opthy 11/1998;12/01;11/02  . Aortic valve replacement 09/06/2011    Procedure: AORTIC VALVE REPLACEMENT (AVR);  Surgeon: Rexene Alberts, MD;  Location: [facility];  Service: Open Heart Surgery;  Laterality: N/A;  . Coronary artery bypass graft 09/06/2011    Procedure: CORONARY ARTERY BYPASS GRAFTING (CABG);  Surgeon: Rexene Alberts, MD;  Location: [facility];  Service: Open Heart Surgery;  Laterality: N/A;    Current Outpatient Prescriptions  Medication Sig Dispense Refill  . albuterol (PROVENTIL HFA;VENTOLIN HFA) 108 (90 BASE) MCG/ACT inhaler Inhale 2 puffs into the lungs every 4 (four) hours as needed. For shortness of breath.      Marland Kitchen amLODipine (NORVASC) 10 MG tablet Take 10 mg by mouth daily.       Marland Kitchen aspirin 325 MG EC tablet Take 325 mg by mouth daily.        Marland Kitchen  atorvastatin (LIPITOR) 40 MG tablet Take 1 tablet (40 mg total) by mouth daily.  30 tablet  3  . carvedilol (COREG) 12.5 MG tablet 1 and 1/2 tablets (total 18.75mg  ) twice a day  90 tablet  6  . furosemide (LASIX) 20 MG tablet Take 1 tablet (20 mg total) by mouth daily.  30 tablet  3  . glipiZIDE (GLUCOTROL XL) 10 MG 24 hr tablet Take 1 tablet (10 mg total) by mouth 2 (two) times daily.  60 tablet  3  . HYDROcodone-acetaminophen (VICODIN) 5-500 MG per tablet Take 1 tablet by mouth every 6 (six) hours as needed.  30 tablet  0  . levothyroxine (SYNTHROID, LEVOTHROID) 50 MCG tablet Take 50 mcg by mouth daily.      Marland Kitchen lisinopril (PRINIVIL,ZESTRIL) 5 MG tablet Take 1 tablet (5 mg total) by mouth daily.  90 tablet  3  . miconazole (MICOTIN) 2 % powder Apply topically 2 (two) times daily. Apply under  breasts twice daily for fungal rash      . omeprazole (PRILOSEC) 20 MG capsule Take 20 mg by mouth daily.      . phosphate (FLEET) enema Place 1 enema rectally once as needed. For constipation      . potassium chloride SA (K-DUR,KLOR-CON) 20 MEQ tablet Take 1 tablet (20 mEq total) by mouth daily.  30 tablet  3  . senna (SENOKOT) 8.6 MG TABS Take 1 tablet by mouth daily.      Marland Kitchen zolpidem (AMBIEN) 5 MG tablet Take 1 tablet (5 mg total) by mouth at bedtime as needed. For sleep  30 tablet  0    Allergies  Allergen Reactions  . Metformin     REACTION: intolerant  . Promethazine Hcl     REACTION: u/k    Review of Systems negative except from HPI and PMH  Physical Exam BP 126/67  Pulse 60  Ht 5\' 2"  (1.575 m)  Wt 102 lb 12.8 oz (46.63 kg)  BMI 18.80 kg/m2  SpO2 98% Well developed and well nourished in no acute distress HENT normal E scleral and icterus clear Neck Supple JVP flat; carotids brisk and full Clear to ausculation Device pocket well healed; without hematoma or erythema  Regular rate and rhythm, no murmurs gallops or rub Soft with active bowel sounds No clubbing cyanosis none Edema Alert and oriented, grossly normal motor and sensory function Skin Warm and Dry    Assessment and  Plan

## 2011-12-05 NOTE — Assessment & Plan Note (Signed)
No intercurrent atrial fibrillation detected on her device

## 2011-12-05 NOTE — Patient Instructions (Addendum)
Your physician wants you to follow-up in: 9 months with Dr. Caryl Comes.  You will receive a reminder letter in the mail two months in advance. If you don't receive a letter, please call our office to schedule the follow-up appointment.

## 2011-12-05 NOTE — Assessment & Plan Note (Signed)
INTERMITTENT now his intrinsic conduction with a long AV delay

## 2011-12-05 NOTE — Assessment & Plan Note (Signed)
The patient's device was interrogated and the information was fully reviewed.  The device was reprogrammed to maximize longevity  

## 2011-12-07 ENCOUNTER — Other Ambulatory Visit: Payer: Self-pay | Admitting: Thoracic Surgery (Cardiothoracic Vascular Surgery)

## 2011-12-07 DIAGNOSIS — I2510 Atherosclerotic heart disease of native coronary artery without angina pectoris: Secondary | ICD-10-CM

## 2011-12-08 ENCOUNTER — Ambulatory Visit: Payer: Medicare Other | Admitting: Family Medicine

## 2011-12-08 ENCOUNTER — Other Ambulatory Visit: Payer: Self-pay | Admitting: Family Medicine

## 2011-12-11 ENCOUNTER — Encounter: Payer: Self-pay | Admitting: Thoracic Surgery (Cardiothoracic Vascular Surgery)

## 2011-12-11 ENCOUNTER — Ambulatory Visit (INDEPENDENT_AMBULATORY_CARE_PROVIDER_SITE_OTHER): Payer: Medicare Other | Admitting: Thoracic Surgery (Cardiothoracic Vascular Surgery)

## 2011-12-11 ENCOUNTER — Ambulatory Visit
Admission: RE | Admit: 2011-12-11 | Discharge: 2011-12-11 | Disposition: A | Discharge status: Home / Self Care | Payer: Medicare Other | Source: Ambulatory Visit | Attending: Thoracic Surgery (Cardiothoracic Vascular Surgery) | Admitting: Thoracic Surgery (Cardiothoracic Vascular Surgery)

## 2011-12-11 VITALS — BP 123/57 | HR 59 | Resp 16 | Ht 61.0 in | Wt 101.5 lb

## 2011-12-11 DIAGNOSIS — Z952 Presence of prosthetic heart valve: Secondary | ICD-10-CM

## 2011-12-11 DIAGNOSIS — I350 Nonrheumatic aortic (valve) stenosis: Secondary | ICD-10-CM

## 2011-12-11 DIAGNOSIS — Z954 Presence of other heart-valve replacement: Secondary | ICD-10-CM

## 2011-12-11 DIAGNOSIS — I359 Nonrheumatic aortic valve disorder, unspecified: Secondary | ICD-10-CM

## 2011-12-11 DIAGNOSIS — Z951 Presence of aortocoronary bypass graft: Secondary | ICD-10-CM

## 2011-12-11 DIAGNOSIS — I2510 Atherosclerotic heart disease of native coronary artery without angina pectoris: Secondary | ICD-10-CM

## 2011-12-11 NOTE — Progress Notes (Signed)
[address]            [place] [address]          [phone]     CARDIOTHORACIC SURGERY OFFICE NOTE  Referring Provider is Larey Dresser, MD PCP is Loura Pardon, MD   HPI:  Patient returns for followup 3 months status post aortic valve replacement and coronary artery bypass grafting x3.  She was last seen here in the office on 10/09/2011. Since then she has continued to do well. She reports no shortness of breath. She no longer has any pain in her chest. Her activity level is good. She states that her energy is still not back to 100% but overall she has no complaints. She does note that she has no recollection of her surgery are the for several days afterwards. Overall she is getting along well and please with her progress.   Current Outpatient Prescriptions  Medication Sig Dispense Refill  . albuterol (PROVENTIL HFA;VENTOLIN HFA) 108 (90 BASE) MCG/ACT inhaler Inhale 2 puffs into the lungs every 4 (four) hours as needed. For shortness of breath.      Marland Kitchen amLODipine (NORVASC) 10 MG tablet Take 10 mg by mouth daily.       Marland Kitchen aspirin 325 MG EC tablet Take 325 mg by mouth daily.        Marland Kitchen atorvastatin (LIPITOR) 40 MG tablet Take 1 tablet (40 mg total) by mouth daily.  30 tablet  3  . carvedilol (COREG) 12.5 MG tablet 1 and 1/2 tablets (total 18.75mg  ) twice a day  90 tablet  6  . furosemide (LASIX) 20 MG tablet Take 1 tablet (20 mg total) by mouth daily.  30 tablet  3  . glipiZIDE (GLUCOTROL XL) 10 MG 24 hr tablet Take 1 tablet (10 mg total) by mouth 2 (two) times daily.  60 tablet  3  . lisinopril (PRINIVIL,ZESTRIL) 5 MG tablet Take 1 tablet (5 mg total) by mouth daily.  90 tablet  3  . miconazole (MICOTIN) 2 % powder Apply topically 2 (two) times daily. Apply under breasts twice daily for fungal rash      . omeprazole (PRILOSEC) 20 MG capsule Take 20 mg by mouth daily.      . phosphate (FLEET) enema Place 1 enema rectally once as needed. For constipation       . potassium chloride SA (K-DUR,KLOR-CON) 20 MEQ tablet Take 1 tablet (20 mEq total) by mouth daily.  30 tablet  3  . senna (SENOKOT) 8.6 MG TABS Take 1 tablet by mouth daily.      Marland Kitchen SYNTHROID 50 MCG tablet TAKE 1 TABLET EVERY DAY  30 each  3      Physical Exam:   BP 123/57  Pulse 59  Resp 16  Ht 5\' 1"  (1.549 m)  Wt 101 lb 8 oz (46.04 kg)  BMI 19.18 kg/m2  SpO2 96%  General:  Frail but well-appearing  Chest:   Clear to auscultation with symmetrical breath sounds  CV:   Regular rate and rhythm without murmur  Incisions:  Completely healed  Abdomen:  Soft and nontender  Extremities:  Warm and well-perfused  Diagnostic Tests:  *RADIOLOGY REPORT*  Clinical Data: Post CABG.  CHEST - 2 VIEW  Comparison: 10/11/2011.  Findings: Trachea is midline. Heart size normal. Thoracic aorta  is calcified. Pacemaker lead tips project over the right atrium  and right ventricle. Sternotomy wires are stable in position.  Large hiatal hernia with a small air-fluid level.  Resolved bilateral pleural effusions and bibasilar air space  disease. No edema. No pneumothorax. Scattered calcified  granulomas.  IMPRESSION:  1. Resolved bilateral pleural effusions and bibasilar air space  disease.  2. Large hiatal hernia.  Original Report Authenticated By: Luretha Rued, M.D.    Transthoracic Echocardiography  Patient: Cynethia, Blancas MR #: [id number] Study Date: 09/15/2011 Gender: F Age: 53 Height: 154.9cm Weight: 45.8kg BSA: 1.36m^2 Pt. Status: Room: 2012  [facility] ADMITTING Darylene Price, MD ATTENDING Darylene Price, MD SONOGRAPHER Wyatt Mage ORDERING [name], Burman Freestone [name], [name] cc:  ------------------------------------------------------------ LV EF: 65% - 70%  ------------------------------------------------------------ Indications: Pericardial effusion 423.9.  ------------------------------------------------------------ History: PMH:  Atrial fibrillation. Coronary artery disease. Congestive heart failure. PMH: Myocardial infarction. Risk factors: Hypertension. Diabetes mellitus. Dyslipidemia.  ------------------------------------------------------------ Study Conclusions  - Left ventricle: The cavity size was below normal. Wall thickness was increased in a pattern of mild LVH. Systolic function was vigorous. The estimated ejection fraction was in the range of 65% to 70%. Doppler parameters are consistent with abnormal left ventricular relaxation (grade 1 diastolic dysfunction). - Aortic valve: AV prosthesis is difficult to see. Peak and mean gradients through the valve are 25 and 14 mm Hg.  ------------------------------------------------------------ Labs, prior tests, procedures, and surgery: Permanent pacemaker system implantation.  Coronary artery bypass grafting. Valve surgery. Aortic valve replacement with a bioprosthetic valve. Transthoracic echocardiography. M-mode, complete 2D, spectral Doppler, and color Doppler. Height: Height: 154.9cm. Height: 61in. Weight: Weight: 45.8kg. Weight: 100.8lb. Body mass index: BMI: 19.1kg/m^2. Body surface area: BSA: 1.68m^2. Blood pressure: 109/46. Patient status: Inpatient. Location: Bedside.  ------------------------------------------------------------  ------------------------------------------------------------ Left ventricle: The cavity size was below normal. Wall thickness was increased in a pattern of mild LVH. Systolic function was vigorous. The estimated ejection fraction was in the range of 65% to 70%. Doppler parameters are consistent with abnormal left ventricular relaxation (grade 1 diastolic dysfunction).  ------------------------------------------------------------ Aortic valve: AV prosthesis is difficult to see. Peak and mean gradients through the valve are 25 and 14 mm Hg. Doppler: VTI ratio of LVOT to aortic valve: 0.49. Peak velocity ratio of  LVOT to aortic valve: 0.42. Mean gradient: 50mm Hg (S). Peak gradient: 16mm Hg (S).  ------------------------------------------------------------ Mitral valve: Calcified annulus. Mildly thickened leaflets . Doppler: No significant regurgitation. Valve area by pressure half-time: 2.68cm^2. Indexed valve area by pressure half-time: 1.9cm^2/m^2. Mean gradient: 27mm Hg (D). Peak gradient: 26mm Hg (D).  ------------------------------------------------------------ Left atrium: The atrium was normal in size.  ------------------------------------------------------------ Right ventricle: The cavity size was normal. Wall thickness was normal. Systolic function was normal.  ------------------------------------------------------------ Pulmonic valve: Structurally normal valve. Cusp separation was normal. Doppler: Transvalvular velocity was within the normal range. No regurgitation.  ------------------------------------------------------------ Right atrium: The atrium was normal in size.  ------------------------------------------------------------ Pericardium: There was no pericardial effusion.  ------------------------------------------------------------  2D measurements Normal Doppler measurements Normal Left ventricle LVOT LVID ED, 27.9 mm 43-52 Peak vel, 103 cm/s ------ chord, S PLAX VTI, S 23.5 cm ------ LVID ES, 19 mm 23-38 Aortic valve chord, Peak vel, 248 cm/s ------ PLAX S FS, chord, 32 % >29 Mean vel, 168 cm/s ------ PLAX S LVPW, ED 17.9 mm ------ VTI, S 48 cm ------ IVS/LVPW 0.75 <1.3 Mean 13 mm Hg ------ ratio, ED gradient, Ventricular septum S IVS, ED 13.5 mm ------ Peak 25 mm Hg ------ Aorta gradient, Root diam, 26 mm ------ S ED VTI ratio 0.49 ------  Left atrium LVOT/AV AP dim 39 mm ------ Peak vel 0.42 ------ AP dim 2.77 cm/m^2 <2.2 ratio, index LVOT/AV Vol, S 76 ml ------ Mitral valve Vol index, 53.9 ml/m^2 ------ Peak E vel 143 cm/s ------ S Peak A vel 123  cm/s ------ Mean vel, 103 cm/s ------ D Decelerati 229 ms 150-23 on time 0 Pressure 82 ms ------ half-time Mean 5 mm Hg ------ gradient, D Peak 11 mm Hg ------ gradient, D Peak E/A 1.2 ------ ratio Area (PHT) 2.68 cm^2 ------ Area index 1.9 cm^2/m ------ (PHT) ^2 Annulus 38.5 cm ------ VTI Systemic veins Estimated 10 mm Hg ------ CVP Right ventricle Sa vel, 8.11 cm/s ------ lat ann, tiss DP  ------------------------------------------------------------ Prepared and Electronically Authenticated by  Dorris Carnes 2013-06-28T17:[id number]   Impression:  Patient is doing well 3 months following aortic valve replacement and coronary artery bypass grafting. Her pleural effusions have resolved. For followup echocardiogram looks good.  Plan:  In the future the patient will call and return to see [place] as needed.   Valentina Gu. Roxy Manns, MD 12/11/2011 12:41 PM

## 2011-12-20 ENCOUNTER — Ambulatory Visit: Payer: Medicare Other

## 2011-12-25 ENCOUNTER — Ambulatory Visit: Payer: Medicare Other | Admitting: Cardiology

## 2012-02-07 ENCOUNTER — Other Ambulatory Visit: Payer: Self-pay | Admitting: Family Medicine

## 2012-02-16 ENCOUNTER — Encounter: Payer: Self-pay | Admitting: *Deleted

## 2012-02-19 ENCOUNTER — Ambulatory Visit (INDEPENDENT_AMBULATORY_CARE_PROVIDER_SITE_OTHER): Payer: Medicare Other | Admitting: Cardiology

## 2012-02-19 ENCOUNTER — Encounter: Payer: Self-pay | Admitting: Cardiology

## 2012-02-19 VITALS — BP 122/56 | HR 60 | Ht 61.0 in | Wt 105.0 lb

## 2012-02-19 DIAGNOSIS — E119 Type 2 diabetes mellitus without complications: Secondary | ICD-10-CM

## 2012-02-19 DIAGNOSIS — I350 Nonrheumatic aortic (valve) stenosis: Secondary | ICD-10-CM

## 2012-02-19 DIAGNOSIS — I10 Essential (primary) hypertension: Secondary | ICD-10-CM

## 2012-02-19 DIAGNOSIS — E7800 Pure hypercholesterolemia, unspecified: Secondary | ICD-10-CM

## 2012-02-19 DIAGNOSIS — I2510 Atherosclerotic heart disease of native coronary artery without angina pectoris: Secondary | ICD-10-CM

## 2012-02-19 DIAGNOSIS — I5032 Chronic diastolic (congestive) heart failure: Secondary | ICD-10-CM

## 2012-02-19 DIAGNOSIS — I509 Heart failure, unspecified: Secondary | ICD-10-CM

## 2012-02-19 DIAGNOSIS — I359 Nonrheumatic aortic valve disorder, unspecified: Secondary | ICD-10-CM

## 2012-02-19 DIAGNOSIS — I6529 Occlusion and stenosis of unspecified carotid artery: Secondary | ICD-10-CM

## 2012-02-19 DIAGNOSIS — I442 Atrioventricular block, complete: Secondary | ICD-10-CM

## 2012-02-19 DIAGNOSIS — I4891 Unspecified atrial fibrillation: Secondary | ICD-10-CM

## 2012-02-19 MED ORDER — CARVEDILOL 12.5 MG PO TABS: 12.5000 mg | ORAL_TABLET | Freq: Two times a day (BID) | ORAL | Status: DC

## 2012-02-19 NOTE — Patient Instructions (Addendum)
STOP METOPROLOL.   Increase coreg(carvedilol) to 18.75mg  two times a day. This will be 1 and 1/2 of a 12.5mg  tablets two times a day.  Your physician recommends that you return for a FASTING lipid profile /BMET.  Your physician has requested that you have a carotid duplex. This test is an ultrasound of the carotid arteries in your neck. It looks at blood flow through these arteries that supply the brain with blood. Allow one hour for this exam. There are no restrictions or special instructions. March 2014  Your physician wants you to follow-up in: 4 months with Dr Aundra Dubin. (April 2014).You will receive a reminder letter in the mail two months in advance. If you don't receive a letter, please call our office to schedule the follow-up appointment.

## 2012-02-19 NOTE — Progress Notes (Signed)
Patient ID: Barbara Garner, female   DOB: 09-29-1934, 77 y.o.   MRN: [id number] PCP: Dr. Glori Bickers  77 yo with history of CAD s/p unstable angina/PTCA to D1 in 1997, CKD, and aortic stenosis presents for cardiology followup after CABG/AVR and PPM placement.  Earlier this year, she started to note the onset of exertional dypsnea.  She was short of breath walking to the mailbox or after walking about 50 yards in general.   She was short of breath walking up steps or an incline.  Echo showed severe AS.   I did a left heart cath in 5/13, showing three vessel disease.  She had CABG x 3 with bioprosthetic AVR in 6/13.  She had heart block post-op with placement of a dual chamber Medtronic PPM.  She was in rehab initially after surgery and now is back at home.  She lives with her son.  She seems to be doing fairly well.  She did not do cardiac rehab and is doing some walking on her own.  She denies dyspnea.  She is not extremely active but can walk about 1/4 mile to the mailbox and back without problems.  No chest pain.  No lightheadedness or syncope.  Weight is down 2 lbs since last appointment.  She is still taking both Coreg and metoprolol.  Echo post-op showed well-seated bioprosthetic aortic valve with normal EF.    ECG: a-paced, v-sensed with IVCD  Labs (6/11): K 3.4, creatinine 1.6, LDL 76, HDL 47, TSH normal  Labs (1/12): K 3.9, creatinine 1.7 Labs (11/12): TSH normal Labs (3/13): K 4.4, creatinine 1.6, LDL 79, HDL 52 Labs (4/13): K 4.5, creatinine 1.55, BNP 133 Labs (8/13): K 4.4, creatinine 1.1  Allergies:  1) ! Phenergan  2) ! Glucophage   Past History:  1. Anemia-iron deficiency  2. Asthma  3. Coronary artery disease: Unstable angina in 1997 with PTCA of large D1. Myoview (9/10) at [facility] cardiology showed EF 84%, stable perfusion images with no ischemia.  LHC (5/13) showed 95% OM1 stenosis, 75% mLAD, 80% ostial large D1, 75% dRCA.  She had LIMA-LAD, SVG-PDA, and SVG-OM at time of AVR in  6/13.  4. Diabetes mellitus, type II  5. GERD  6. Hypertension  7. Hypothyroidism  8. Pneumonia, hx of  9. CKD: creatinine 1.6 in 6/11  10. Chronic LBBB  11. Aortic stenosis: Severe. Echo (3/13): EF 55-65%, moderately LV hypertrophy, severe AS with mean gradient 40 mmHg/peak gradient 70 mmHg, PA systolic pressure 35 mmHg.  Patient had bioprosthetic AVR in 6/13.  Echo post-op in 6/13 showed mild LVH, EF 65-70%, mean gradient 14 mmHg across bioprosthetic aortic valve.  12. Carotid stenosis: Carotid dopplers 0000000 with [id number] LICA stenosis. Carotid dopplers (3/13) with 40-59% bilateral carotid stenosis.  13. Post-op complete heart block in 6/13 with Medtronic dual chamber PPM.   Family History:  No premature CAD.  Social History:  non smoker  widowed, lives with son between [place] and [place] reviewed and negative except as per HPI.   Current Outpatient Prescriptions  Medication Sig Dispense Refill  . albuterol (PROVENTIL HFA;VENTOLIN HFA) 108 (90 BASE) MCG/ACT inhaler Inhale 2 puffs into the lungs every 4 (four) hours as needed. For shortness of breath.      Marland Kitchen amLODipine (NORVASC) 10 MG tablet Take 10 mg by mouth daily.       Marland Kitchen aspirin 325 MG EC tablet Take 325 mg by mouth daily.        Marland Kitchen  atorvastatin (LIPITOR) 40 MG tablet Take 1 tablet (40 mg total) by mouth daily.  30 tablet  3  . carvedilol (COREG) 12.5 MG tablet Take 1 tablet (12.5 mg total) by mouth 2 (two) times daily with a meal. 1 and 1/2 tablets (total 18.75mg  ) twice a day  90 tablet  6  . furosemide (LASIX) 20 MG tablet Take 1 tablet (20 mg total) by mouth daily.  30 tablet  3  . glipiZIDE (GLUCOTROL XL) 10 MG 24 hr tablet Take 1 tablet (10 mg total) by mouth 2 (two) times daily.  60 tablet  3  . lisinopril (PRINIVIL,ZESTRIL) 5 MG tablet Take 1 tablet (5 mg total) by mouth daily.  90 tablet  3  . miconazole (MICOTIN) 2 % powder Apply topically 2 (two) times daily. Apply under breasts  twice daily for fungal rash      . omeprazole (PRILOSEC) 20 MG capsule Take 20 mg by mouth daily.      . phosphate (FLEET) enema Place 1 enema rectally once as needed. For constipation      . senna (SENOKOT) 8.6 MG TABS Take 1 tablet by mouth daily.      Marland Kitchen SYNTHROID 50 MCG tablet TAKE 1 TABLET EVERY DAY  30 tablet  5  . potassium chloride SA (K-DUR,KLOR-CON) 20 MEQ tablet Take 1 tablet (20 mEq total) by mouth daily.  30 tablet  3    BP 122/56  Pulse 60  Ht 5\' 1"  (1.549 m)  Wt 105 lb (47.628 kg)  BMI 19.84 kg/m2 General: NAD Neck: No JVD, no thyromegaly or thyroid nodule.  Lungs: Clear to auscultation bilaterally with normal respiratory effort. CV: Nondisplaced PMI.  Heart regular S1/S2, no S3/S4, 2/6 early SEM RUSB.  No edema.  Soft bilateral carotid bruits.   Abdomen: Soft, nontender, no hepatosplenomegaly, no distention.  Neurologic: Alert and oriented x 3.  Psych: Normal affect. Extremities: No clubbing or cyanosis.   Assessment/Plan:  Aortic stenosis Status post AVR. Well-seated bioprosthetic aortic valve on post-op echo.  CAD  No chest pain. She is now s/p CABG x 3 at time of AVR. Continue ASA, statin, ACEI.  She needs to stop metoprolol.  I will have her continue Coreg at 18.75 mg bid (should not take both).  She did not want to do cardiac rehab.  I encouraged her to try to walk more.  Carotid stenosis  Moderate bilateral carotid stenosis. Repeat carotid dopplers 3/14.  HYPERCHOLESTEROLEMIA Continue moderate-dose statin, due for lipids.   HYPERTENSION  BP now under good control.  Complete heart block CHB post-CABG/AVR. Has Medtonic PPM. Chronic diastolic CHF Appears euvolemic.  Continue current Lasix dose for now.  Will get BMET.  Loralie Champagne 02/20/2012

## 2012-02-21 ENCOUNTER — Other Ambulatory Visit: Payer: Self-pay | Admitting: *Deleted

## 2012-02-21 DIAGNOSIS — I359 Nonrheumatic aortic valve disorder, unspecified: Secondary | ICD-10-CM

## 2012-02-21 DIAGNOSIS — I2510 Atherosclerotic heart disease of native coronary artery without angina pectoris: Secondary | ICD-10-CM

## 2012-02-21 MED ORDER — CARVEDILOL 12.5 MG PO TABS: ORAL_TABLET | ORAL | Status: DC

## 2012-02-26 ENCOUNTER — Other Ambulatory Visit (INDEPENDENT_AMBULATORY_CARE_PROVIDER_SITE_OTHER): Payer: Medicare Other

## 2012-02-26 DIAGNOSIS — I359 Nonrheumatic aortic valve disorder, unspecified: Secondary | ICD-10-CM

## 2012-02-26 DIAGNOSIS — I25810 Atherosclerosis of coronary artery bypass graft(s) without angina pectoris: Secondary | ICD-10-CM

## 2012-02-26 DIAGNOSIS — R0602 Shortness of breath: Secondary | ICD-10-CM

## 2012-02-26 LAB — BASIC METABOLIC PANEL
Calcium: 8.5 mg/dL (ref 8.4–10.5)
Chloride: 103 mEq/L (ref 96–112)
Creatinine, Ser: 1.4 mg/dL — ABNORMAL HIGH (ref 0.4–1.2)
GFR: 38.87 mL/min — ABNORMAL LOW (ref >60.00)

## 2012-02-26 LAB — LIPID PANEL
Cholesterol: 142 mg/dL (ref 0–200)
LDL Cholesterol: 68 mg/dL (ref 0–99)
Triglycerides: 174 mg/dL — ABNORMAL HIGH (ref 0.0–149.0)

## 2012-02-26 LAB — HEPATIC FUNCTION PANEL
ALT: 15 U/L (ref 0–35)
AST: 18 U/L (ref 0–37)
Albumin: 3.7 g/dL (ref 3.5–5.2)
Alkaline Phosphatase: 111 U/L (ref 39–117)
Total Protein: 7.8 g/dL (ref 6.0–8.3)

## 2012-02-27 ENCOUNTER — Other Ambulatory Visit: Payer: Self-pay | Admitting: Family Medicine

## 2012-02-27 ENCOUNTER — Other Ambulatory Visit: Payer: Self-pay | Admitting: *Deleted

## 2012-02-27 DIAGNOSIS — I359 Nonrheumatic aortic valve disorder, unspecified: Secondary | ICD-10-CM

## 2012-02-27 MED ORDER — ZOLPIDEM TARTRATE 5 MG PO TABS: 5.0000 mg | ORAL_TABLET | Freq: Every evening | ORAL | Status: DC | PRN

## 2012-02-27 NOTE — Telephone Encounter (Signed)
Received faxed refill request, not on med list, ok to refill?

## 2012-02-27 NOTE — Telephone Encounter (Signed)
Called pt an she stated that she didn't need her refill of her Barbara Garner so canceled Rx

## 2012-02-27 NOTE — Telephone Encounter (Signed)
She has taken it before  Ask if she still wants it - if so, Px written for call in

## 2012-03-03 ENCOUNTER — Other Ambulatory Visit: Payer: Self-pay | Admitting: Family Medicine

## 2012-03-04 ENCOUNTER — Other Ambulatory Visit: Payer: Self-pay | Admitting: *Deleted

## 2012-03-04 MED ORDER — GLIPIZIDE ER 10 MG PO TB24: 10.0000 mg | ORAL_TABLET | Freq: Two times a day (BID) | ORAL | Status: DC

## 2012-03-22 ENCOUNTER — Observation Stay (HOSPITAL_COMMUNITY)
Admission: EM | Admit: 2012-03-22 | Discharge: 2012-03-23 | Disposition: A | Discharge status: Home / Self Care | Payer: Medicare Other | Attending: Internal Medicine | Admitting: Internal Medicine

## 2012-03-22 ENCOUNTER — Encounter (HOSPITAL_COMMUNITY): Payer: Self-pay | Admitting: Internal Medicine

## 2012-03-22 ENCOUNTER — Emergency Department (HOSPITAL_COMMUNITY): Payer: Medicare Other

## 2012-03-22 DIAGNOSIS — M549 Dorsalgia, unspecified: Secondary | ICD-10-CM

## 2012-03-22 DIAGNOSIS — I350 Nonrheumatic aortic (valve) stenosis: Secondary | ICD-10-CM

## 2012-03-22 DIAGNOSIS — I252 Old myocardial infarction: Secondary | ICD-10-CM

## 2012-03-22 DIAGNOSIS — E039 Hypothyroidism, unspecified: Secondary | ICD-10-CM

## 2012-03-22 DIAGNOSIS — I25810 Atherosclerosis of coronary artery bypass graft(s) without angina pectoris: Secondary | ICD-10-CM

## 2012-03-22 DIAGNOSIS — L259 Unspecified contact dermatitis, unspecified cause: Secondary | ICD-10-CM

## 2012-03-22 DIAGNOSIS — I442 Atrioventricular block, complete: Secondary | ICD-10-CM

## 2012-03-22 DIAGNOSIS — I447 Left bundle-branch block, unspecified: Secondary | ICD-10-CM

## 2012-03-22 DIAGNOSIS — D485 Neoplasm of uncertain behavior of skin: Secondary | ICD-10-CM

## 2012-03-22 DIAGNOSIS — I6529 Occlusion and stenosis of unspecified carotid artery: Secondary | ICD-10-CM

## 2012-03-22 DIAGNOSIS — M5137 Other intervertebral disc degeneration, lumbosacral region: Secondary | ICD-10-CM | POA: Insufficient documentation

## 2012-03-22 DIAGNOSIS — C4492 Squamous cell carcinoma of skin, unspecified: Secondary | ICD-10-CM

## 2012-03-22 DIAGNOSIS — Z950 Presence of cardiac pacemaker: Secondary | ICD-10-CM

## 2012-03-22 DIAGNOSIS — Z952 Presence of prosthetic heart valve: Secondary | ICD-10-CM

## 2012-03-22 DIAGNOSIS — G609 Hereditary and idiopathic neuropathy, unspecified: Secondary | ICD-10-CM

## 2012-03-22 DIAGNOSIS — E7800 Pure hypercholesterolemia, unspecified: Secondary | ICD-10-CM

## 2012-03-22 DIAGNOSIS — Z79899 Other long term (current) drug therapy: Secondary | ICD-10-CM | POA: Insufficient documentation

## 2012-03-22 DIAGNOSIS — I10 Essential (primary) hypertension: Secondary | ICD-10-CM

## 2012-03-22 DIAGNOSIS — L538 Other specified erythematous conditions: Secondary | ICD-10-CM

## 2012-03-22 DIAGNOSIS — D649 Anemia, unspecified: Secondary | ICD-10-CM | POA: Insufficient documentation

## 2012-03-22 DIAGNOSIS — I2510 Atherosclerotic heart disease of native coronary artery without angina pectoris: Secondary | ICD-10-CM

## 2012-03-22 DIAGNOSIS — R0602 Shortness of breath: Secondary | ICD-10-CM

## 2012-03-22 DIAGNOSIS — K219 Gastro-esophageal reflux disease without esophagitis: Secondary | ICD-10-CM

## 2012-03-22 DIAGNOSIS — I359 Nonrheumatic aortic valve disorder, unspecified: Secondary | ICD-10-CM

## 2012-03-22 DIAGNOSIS — M51379 Other intervertebral disc degeneration, lumbosacral region without mention of lumbar back pain or lower extremity pain: Principal | ICD-10-CM | POA: Insufficient documentation

## 2012-03-22 DIAGNOSIS — Z8709 Personal history of other diseases of the respiratory system: Secondary | ICD-10-CM

## 2012-03-22 DIAGNOSIS — I5032 Chronic diastolic (congestive) heart failure: Secondary | ICD-10-CM

## 2012-03-22 DIAGNOSIS — Z954 Presence of other heart-valve replacement: Secondary | ICD-10-CM | POA: Insufficient documentation

## 2012-03-22 DIAGNOSIS — I129 Hypertensive chronic kidney disease with stage 1 through stage 4 chronic kidney disease, or unspecified chronic kidney disease: Secondary | ICD-10-CM | POA: Insufficient documentation

## 2012-03-22 DIAGNOSIS — Z7902 Long term (current) use of antithrombotics/antiplatelets: Secondary | ICD-10-CM | POA: Insufficient documentation

## 2012-03-22 DIAGNOSIS — J45909 Unspecified asthma, uncomplicated: Secondary | ICD-10-CM

## 2012-03-22 DIAGNOSIS — I4891 Unspecified atrial fibrillation: Secondary | ICD-10-CM

## 2012-03-22 DIAGNOSIS — R413 Other amnesia: Secondary | ICD-10-CM

## 2012-03-22 DIAGNOSIS — E119 Type 2 diabetes mellitus without complications: Secondary | ICD-10-CM

## 2012-03-22 DIAGNOSIS — Z951 Presence of aortocoronary bypass graft: Secondary | ICD-10-CM

## 2012-03-22 DIAGNOSIS — D509 Iron deficiency anemia, unspecified: Secondary | ICD-10-CM

## 2012-03-22 DIAGNOSIS — N184 Chronic kidney disease, stage 4 (severe): Secondary | ICD-10-CM

## 2012-03-22 DIAGNOSIS — I509 Heart failure, unspecified: Secondary | ICD-10-CM

## 2012-03-22 LAB — BASIC METABOLIC PANEL
BUN: 34 mg/dL — ABNORMAL HIGH (ref 6–23)
CO2: 28 mEq/L (ref 19–32)
Calcium: 8 mg/dL — ABNORMAL LOW (ref 8.4–10.5)
Creatinine, Ser: 1.24 mg/dL — ABNORMAL HIGH (ref 0.50–1.10)
Glucose, Bld: 157 mg/dL — ABNORMAL HIGH (ref 70–99)

## 2012-03-22 LAB — URINALYSIS, ROUTINE W REFLEX MICROSCOPIC
Bilirubin Urine: NEGATIVE
Nitrite: NEGATIVE
Specific Gravity, Urine: 1.014 (ref 1.005–1.030)
Urobilinogen, UA: 0.2 mg/dL (ref 0.0–1.0)

## 2012-03-22 LAB — CBC
HCT: 33.8 % — ABNORMAL LOW (ref 36.0–46.0)
HCT: 34.9 % — ABNORMAL LOW (ref 36.0–46.0)
Hemoglobin: 11.2 g/dL — ABNORMAL LOW (ref 12.0–15.0)
Hemoglobin: 11.5 g/dL — ABNORMAL LOW (ref 12.0–15.0)
MCH: 30.7 pg (ref 26.0–34.0)
MCHC: 33 g/dL (ref 30.0–36.0)
MCV: 92.6 fL (ref 78.0–100.0)
MCV: 92.8 fL (ref 78.0–100.0)
RBC: 3.65 MIL/uL — ABNORMAL LOW (ref 3.87–5.11)
RDW: 13.2 % (ref 11.5–15.5)

## 2012-03-22 LAB — CREATININE, SERUM
GFR calc Af Amer: 44 mL/min — ABNORMAL LOW (ref >90)
GFR calc non Af Amer: 38 mL/min — ABNORMAL LOW (ref >90)

## 2012-03-22 LAB — URINE MICROSCOPIC-ADD ON

## 2012-03-22 MED ORDER — ONDANSETRON HCL 4 MG PO TABS: 4.0000 mg | ORAL_TABLET | Freq: Four times a day (QID) | ORAL | Status: DC | PRN

## 2012-03-22 MED ORDER — MORPHINE SULFATE 2 MG/ML IJ SOLN
2.0000 mg | INTRAMUSCULAR | Status: DC | PRN
  Administered 2012-03-23: 2 mg via INTRAVENOUS
  Filled 2012-03-22: qty 1

## 2012-03-22 MED ORDER — CARVEDILOL 6.25 MG PO TABS
18.7500 mg | ORAL_TABLET | Freq: Two times a day (BID) | ORAL | Status: DC
  Administered 2012-03-22 – 2012-03-23 (×2): 18.75 mg via ORAL
  Filled 2012-03-22 (×4): qty 1

## 2012-03-22 MED ORDER — ALBUTEROL SULFATE HFA 108 (90 BASE) MCG/ACT IN AERS
2.0000 | INHALATION_SPRAY | RESPIRATORY_TRACT | Status: DC | PRN
  Filled 2012-03-22 (×2): qty 6.7

## 2012-03-22 MED ORDER — ACETAMINOPHEN 650 MG RE SUPP: 650.0000 mg | Freq: Four times a day (QID) | RECTAL | Status: DC | PRN

## 2012-03-22 MED ORDER — HYDROMORPHONE HCL PF 1 MG/ML IJ SOLN
1.0000 mg | Freq: Once | INTRAMUSCULAR | Status: AC
  Administered 2012-03-22: 1 mg via INTRAVENOUS
  Filled 2012-03-22: qty 1

## 2012-03-22 MED ORDER — ATORVASTATIN CALCIUM 40 MG PO TABS
40.0000 mg | ORAL_TABLET | Freq: Every day | ORAL | Status: DC
  Administered 2012-03-22: 40 mg via ORAL
  Filled 2012-03-22 (×2): qty 1

## 2012-03-22 MED ORDER — HYDROCODONE-ACETAMINOPHEN 5-325 MG PO TABS
1.0000 | ORAL_TABLET | ORAL | Status: DC | PRN
  Administered 2012-03-23 (×2): 1 via ORAL
  Filled 2012-03-22 (×3): qty 1

## 2012-03-22 MED ORDER — FUROSEMIDE 20 MG PO TABS
20.0000 mg | ORAL_TABLET | Freq: Every day | ORAL | Status: DC
  Administered 2012-03-22 – 2012-03-23 (×2): 20 mg via ORAL
  Filled 2012-03-22 (×2): qty 1

## 2012-03-22 MED ORDER — SODIUM CHLORIDE 0.9 % IJ SOLN
3.0000 mL | Freq: Two times a day (BID) | INTRAMUSCULAR | Status: DC
  Administered 2012-03-22 – 2012-03-23 (×2): 3 mL via INTRAVENOUS

## 2012-03-22 MED ORDER — ACETAMINOPHEN 325 MG PO TABS: 650.0000 mg | ORAL_TABLET | Freq: Four times a day (QID) | ORAL | Status: DC | PRN

## 2012-03-22 MED ORDER — SODIUM CHLORIDE 0.9 % IV SOLN: 250.0000 mL | INTRAVENOUS | Status: DC | PRN

## 2012-03-22 MED ORDER — PANTOPRAZOLE SODIUM 40 MG PO TBEC
40.0000 mg | DELAYED_RELEASE_TABLET | Freq: Every day | ORAL | Status: DC
  Administered 2012-03-22 – 2012-03-23 (×2): 40 mg via ORAL
  Filled 2012-03-22: qty 1

## 2012-03-22 MED ORDER — OXYCODONE-ACETAMINOPHEN 5-325 MG PO TABS
1.0000 | ORAL_TABLET | Freq: Once | ORAL | Status: AC
  Administered 2012-03-22: 1 via ORAL
  Filled 2012-03-22: qty 1

## 2012-03-22 MED ORDER — SODIUM CHLORIDE 0.9 % IJ SOLN: 3.0000 mL | INTRAMUSCULAR | Status: DC | PRN

## 2012-03-22 MED ORDER — LISINOPRIL 5 MG PO TABS
5.0000 mg | ORAL_TABLET | Freq: Every day | ORAL | Status: DC
  Administered 2012-03-22 – 2012-03-23 (×2): 5 mg via ORAL
  Filled 2012-03-22 (×2): qty 1

## 2012-03-22 MED ORDER — LEVOTHYROXINE SODIUM 50 MCG PO TABS
50.0000 ug | ORAL_TABLET | Freq: Every day | ORAL | Status: DC
  Administered 2012-03-23: 50 ug via ORAL
  Filled 2012-03-22 (×2): qty 1

## 2012-03-22 MED ORDER — ONDANSETRON HCL 4 MG/2ML IJ SOLN: 4.0000 mg | Freq: Four times a day (QID) | INTRAMUSCULAR | Status: DC | PRN

## 2012-03-22 MED ORDER — POLYETHYLENE GLYCOL 3350 17 G PO PACK: 17.0000 g | PACK | Freq: Every day | ORAL | Status: DC | PRN

## 2012-03-22 MED ORDER — HEPARIN SODIUM (PORCINE) 5000 UNIT/ML IJ SOLN
5000.0000 [IU] | Freq: Three times a day (TID) | INTRAMUSCULAR | Status: DC
  Administered 2012-03-22 – 2012-03-23 (×4): 5000 [IU] via SUBCUTANEOUS
  Filled 2012-03-22 (×6): qty 1

## 2012-03-22 MED ORDER — GLIPIZIDE ER 10 MG PO TB24
10.0000 mg | ORAL_TABLET | Freq: Two times a day (BID) | ORAL | Status: DC
  Administered 2012-03-22 – 2012-03-23 (×2): 10 mg via ORAL
  Filled 2012-03-22 (×3): qty 1

## 2012-03-22 MED ORDER — AMLODIPINE BESYLATE 10 MG PO TABS
10.0000 mg | ORAL_TABLET | Freq: Every day | ORAL | Status: DC
  Administered 2012-03-22 – 2012-03-23 (×2): 10 mg via ORAL
  Filled 2012-03-22 (×2): qty 1

## 2012-03-22 NOTE — ED Notes (Signed)
Pt and family aware of plan to admit to hospital.  Pt denies any needs at this time.

## 2012-03-22 NOTE — ED Provider Notes (Signed)
History     CSN: [id number]  Arrival date & time 03/22/12  [id number]   First MD Initiated Contact with Patient 03/22/12 [phone]      Chief Complaint  Patient presents with  . Back Pain    (Consider location/radiation/quality/duration/timing/severity/associated sxs/prior treatment) HPI Comments: 78 year old female presents to the ED via EMS with her son complaining of lower back pain "for a while" worsening over the past couple days. No known injury or trauma. Pain began back in July while sitting in hospital bed for a while s/p bipass. She then went to rehab facility and eventually home where the pain has been intermittent but "not bad". Pain described as a "hurting" feeling rated 8/10 worse when she sits up, relieved by laying flat. Denies pain, numbness or tingling down extremities. No loss of control of bowels or bladder or saddle anesthesia. Denies fever, chills, nausea or vomiting.  The history is provided by the patient and a relative.    Past Medical History  Diagnosis Date  . Iron deficiency anemia, unspecified   . Chronic diastolic CHF (congestive heart failure)   . CAD  S/P CABG x 3 08/2011     LIMA to diagonal branch, SVG to OM1, SVG to PDA, EVH via bilateral thighs  . Type II or unspecified type diabetes mellitus without mention of complication, not stated as uncontrolled   . Contact dermatitis and other eczema, due to unspecified cause   . Esophageal reflux   . Headache   . Pacemaker MDT dual chamber     DOI 08/2011  . Pure hypercholesterolemia   . hypertension   . Hypothyroidism   . Atrioventricular block, complete -intermittent   . Memory loss   . Idiopathic peripheral neuropathy   . LBBB (left bundle branch block)   . Carotid stenosis     Carotid dopplers [id number] with [id number] LICA stenosis  . Severe aortic stenosis -S/P AVR      19 mm [facility] Ease pericardial tissue valve  . Extrinsic asthma, unspecified     hasn't used inhaler in past year  . CKD (chronic kidney  disease) stage 4, GFR 15-29 ml/min     Cr 1.6 in 6/11, sees Dr. Arty Baumgartner  . Impaired vision     pt. reports that she identifies her meds by looking at the pillls, she is not able to read labels on bottles    Past Surgical History  Procedure Date  . Ptca [phone]    5 blockages  . Cardiac catheterization   . Esophageal dilation 1997  . Colonoscopy 04/1999    1 polyp  . Esophagogastroduodenoscopy 04/1999    HH; "watermelon stomach" (severe gastritis)  . Dexa 10/2003 ,09/2005    osteoporosis,  Osteopenia  . Cataract extraction, bilateral 2003  . Esophagogastroduodenoscopy 04/2002    Gastritis  . Cardiovascular stress test 10/2002    normal (per patient)  . Abdominal hysterectomy 1977    partial; fibroids  . Bladder tack 1977  . Colonoscopy 06/2004    Neg. Int hem  . Carotid dopplers 2006  . Refractive surgery 2006  . Opthy 11/1998;12/01;11/02  . Aortic valve replacement 09/06/2011    Procedure: AORTIC VALVE REPLACEMENT (AVR);  Surgeon: Rexene Alberts, MD;  Location: [place];  Service: Open Heart Surgery;  Laterality: N/A;  . Coronary artery bypass graft 09/06/2011    Procedure: CORONARY ARTERY BYPASS GRAFTING (CABG);  Surgeon: Rexene Alberts, MD;  Location: [place];  Service: Open Heart Surgery;  Laterality: N/A;  Family History  Problem Relation Age of Onset  . Stroke Father     died in his 16's  . Stroke Mother     died in her 75's    History  Substance Use Topics  . Smoking status: Never Smoker   . Smokeless tobacco: Not on file  . Alcohol Use: No    OB History    Grav Para Term Preterm Abortions TAB SAB Ect Mult Living                  Review of Systems  Constitutional: Negative for fever and chills.  HENT: Negative for neck pain and neck stiffness.   Respiratory: Negative for shortness of breath.   Cardiovascular: Negative for chest pain.  Gastrointestinal: Negative for nausea, vomiting and abdominal pain.  Genitourinary: Negative.   Musculoskeletal:  Positive for back pain.  Skin: Negative.   Neurological: Positive for weakness. Negative for numbness.  Psychiatric/Behavioral: Negative for confusion.    Allergies  Metformin and Promethazine hcl  Home Medications   Current Outpatient Rx  Name  Route  Sig  Dispense  Refill  . ALBUTEROL SULFATE HFA 108 (90 BASE) MCG/ACT IN AERS   Inhalation   Inhale 2 puffs into the lungs every 4 (four) hours as needed. For shortness of breath.         . AMLODIPINE BESYLATE 10 MG PO TABS   Oral   Take 10 mg by mouth daily.          . ASPIRIN 325 MG PO TBEC   Oral   Take 325 mg by mouth daily.           . ATORVASTATIN CALCIUM 40 MG PO TABS      TAKE 1 TABLET BY MOUTH EVERY DAY   30 tablet   5   . CARVEDILOL 12.5 MG PO TABS      Take 1 and 1/2 tablets (total 18.75mg  ) twice a day by mouth   90 tablet   6   . CARVEDILOL 12.5 MG PO TABS      TAKE 1 TABLET BY MOUTH TWICE A DAY WITH A MEAL   60 tablet   5   . FUROSEMIDE 20 MG PO TABS      One every other day         . FUROSEMIDE 20 MG PO TABS      TAKE 1 TABLET BY MOUTH EVERY DAY   30 tablet   5   . GLIPIZIDE ER 10 MG PO TB24   Oral   Take 1 tablet (10 mg total) by mouth 2 (two) times daily.   60 tablet   3   . KLOR-CON M20 20 MEQ PO TBCR      TAKE 1 TABLET BY MOUTH EVERY DAY   30 tablet   5   . LISINOPRIL 5 MG PO TABS   Oral   Take 1 tablet (5 mg total) by mouth daily.   90 tablet   3   . MICONAZOLE NITRATE 2 % EX POWD   Topical   Apply topically 2 (two) times daily. Apply under breasts twice daily for fungal rash         . OMEPRAZOLE 20 MG PO CPDR   Oral   Take 20 mg by mouth daily.         Marland Kitchen PHOSPHATE ENEMA 7-19 GM/118ML RE ENEM   Rectal   Place 1 enema rectally once as needed. For constipation         .  SENNA 8.6 MG PO TABS   Oral   Take 1 tablet by mouth daily.         Marland Kitchen SYNTHROID 50 MCG PO TABS      TAKE 1 TABLET EVERY DAY   30 tablet   5     Dispense as written.     BP  135/66  Pulse 76  Temp 97.8 F (36.6 C) (Oral)  Resp 16  SpO2 96%  Physical Exam  Nursing note and vitals reviewed. Constitutional: She is oriented to person, place, and time. She appears well-developed and well-nourished. No distress.       Laying flat on bed.  HENT:  Head: Normocephalic and atraumatic.  Eyes: Conjunctivae normal and EOM are normal. Pupils are equal, round, and reactive to light.  Neck: Normal range of motion. Neck supple.  Cardiovascular: Normal rate, regular rhythm, normal heart sounds and intact distal pulses.   Pulmonary/Chest: Effort normal and breath sounds normal.  Abdominal: Soft. Bowel sounds are normal. There is no tenderness.  Musculoskeletal:       Lumbar back: She exhibits tenderness and bony tenderness.       Back:       Unable to sit up on her own, very slow to move.  Neurological: She is alert and oriented to person, place, and time. She has normal strength. No sensory deficit.  Skin: Skin is warm and dry.  Psychiatric: She has a normal mood and affect. Her behavior is normal.    ED Course  Procedures (including critical care time)  Labs Reviewed - No data to display Dg Lumbar Spine Complete  03/22/2012  *RADIOLOGY REPORT*  Clinical Data: Low back pain for several days, no known injury  LUMBAR SPINE - COMPLETE 4+ VIEW  Comparison: Chest radiograph - 12/11/2011; chest CT - 08/25/2011  Findings:  There are five non-rib bearing lumbar type vertebral bodies.  Mild scoliotic curvature of the thoracolumbar spine with inferior curvature convex to the left, centered at the L3 - L4 intervertebral disc space.  No anterolisthesis or retrolisthesis. No definite pars defects.  The vertebral body heights are preserved.  Moderate DDD of L3 - L4 with disc space height loss, end plate irregularity and sclerosis and osteophytosis.  Limited visualization of the bilateral SI joints is normal.  Vascular calcifications. Calcified the tip diverticulum versus bone island  overlies the left lower abdominal quadrant.  Limited visualization of the lower thorax demonstrates sequela of prior median sternotomy and valve replacement.  IMPRESSION: Moderate DDD of L3 - L4.   Original Report Authenticated By: Jake Seats, MD      1. Back pain       MDM  78 y/o female with low back pain. No known injury or trauma. Xray without any acute findings. Mild elevated white count at 12.1. Pain improved after receiving percocet, however when I had her try to sit up the pain returned and she was unable to do so. Obtaining CT since patient has pacemaker and cannot have MRI. Spoke with radiologist who states CT is best option at this time. 1:22 PM CT showing central canal stenosis. No pain when laying flat. Tried getting her up to walk and pain returns immediately. Unable to get up to walk. Patient will be admitted to [facility] Dr. [place] Pink with [facility] team 4. Case discussed with Dr. Prince Rome who also evaluated patient and agrees with plan of care.        Illene Labrador, PA-C 03/22/12 1324

## 2012-03-22 NOTE — H&P (Signed)
[facility] History and Physical  Barbara Garner [id number] DOB: 12/01/1934 DOA: 03/22/2012  Referring physician: Dr. Roderic Palau PCP: Loura Pardon, MD  Specialists: none  Chief Complaint: back pain  HPI: Barbara Garner is a 78 y.o. female  Past medical history of chronic stomach heart failure, coronary disease status post CABG and diabetes type 2 that comes in for back pain, that started 2 days prior to admission progressively getting worse. She relates no dental surgeries, urological surgeries or rectal. She relates no fevers. She rates her pain 8/10 worse with sitting up relieved by laying flat, she denies any numbness, tingling loss of control stool or bladder. She denies any fever chills nausea vomiting shortness of breath or chest pain. We are asked to admit for pain control.  Review of Systems: The patient denies anorexia, fever, weight loss,, vision loss, decreased hearing, hoarseness, chest pain, syncope, dyspnea on exertion, peripheral edema, balance deficits, hemoptysis, abdominal pain, melena, hematochezia, severe indigestion/heartburn, hematuria, incontinence, genital sores, muscle weakness, suspicious skin lesions, transient blindness, difficulty walking, depression, unusual weight change, abnormal bleeding, enlarged lymph nodes, angioedema, and breast masses.    Past Medical History  Diagnosis Date  . Iron deficiency anemia, unspecified   . Chronic diastolic CHF (congestive heart failure)   . CAD  S/P CABG x 3 08/2011     LIMA to diagonal branch, SVG to OM1, SVG to PDA, EVH via bilateral thighs  . Type II or unspecified type diabetes mellitus without mention of complication, not stated as uncontrolled   . Contact dermatitis and other eczema, due to unspecified cause   . Esophageal reflux   . Headache   . Pacemaker MDT dual chamber     DOI 08/2011  . Pure hypercholesterolemia   . hypertension   . Hypothyroidism   . Atrioventricular block, complete -intermittent   . Memory  loss   . Idiopathic peripheral neuropathy   . LBBB (left bundle branch block)   . Carotid stenosis     Carotid dopplers 0000000 with [id number] LICA stenosis  . Severe aortic stenosis -S/P AVR      19 mm [facility] Ease pericardial tissue valve  . Extrinsic asthma, unspecified     hasn't used inhaler in past year  . CKD (chronic kidney disease) stage 4, GFR 15-29 ml/min     Cr 1.6 in 6/11, sees Dr. Arty Baumgartner  . Impaired vision     pt. reports that she identifies her meds by looking at the pillls, she is not able to read labels on bottles   Past Surgical History  Procedure Date  . Ptca [phone]    5 blockages  . Cardiac catheterization   . Esophageal dilation 1997  . Colonoscopy 04/1999    1 polyp  . Esophagogastroduodenoscopy 04/1999    HH; "watermelon stomach" (severe gastritis)  . Dexa 10/2003 ,09/2005    osteoporosis,  Osteopenia  . Cataract extraction, bilateral 2003  . Esophagogastroduodenoscopy 04/2002    Gastritis  . Cardiovascular stress test 10/2002    normal (per patient)  . Abdominal hysterectomy 1977    partial; fibroids  . Bladder tack 1977  . Colonoscopy 06/2004    Neg. Int hem  . Carotid dopplers 2006  . Refractive surgery 2006  . Opthy 11/1998;12/01;11/02  . Aortic valve replacement 09/06/2011    Procedure: AORTIC VALVE REPLACEMENT (AVR);  Surgeon: Rexene Alberts, MD;  Location: [place];  Service: Open Heart Surgery;  Laterality: N/A;  . Coronary artery bypass graft 09/06/2011  Procedure: CORONARY ARTERY BYPASS GRAFTING (CABG);  Surgeon: Rexene Alberts, MD;  Location: [place];  Service: Open Heart Surgery;  Laterality: N/A;   Social History:  reports that she has never smoked. She does not have any smokeless tobacco history on file. She reports that she does not drink alcohol or use illicit drugs. Lives at home with son  Allergies  Allergen Reactions  . Metformin     REACTION: intolerant  . Promethazine Hcl     REACTION: u/k    Family History  Problem Relation  Age of Onset  . Stroke Father     died in his 2's  . Stroke Mother     died in her 43's     Prior to Admission medications   Medication Sig Start Date End Date Taking? Authorizing Provider  albuterol (PROVENTIL HFA;VENTOLIN HFA) 108 (90 BASE) MCG/ACT inhaler Inhale 2 puffs into the lungs every 4 (four) hours as needed. For shortness of breath. 01/05/11  Yes Abner Greenspan, MD  amLODipine (NORVASC) 10 MG tablet Take 10 mg by mouth daily.    Yes Historical Provider, MD  atorvastatin (LIPITOR) 40 MG tablet TAKE 1 TABLET BY MOUTH EVERY DAY 03/03/12  Yes Abner Greenspan, MD  carvedilol (COREG) 12.5 MG tablet Take 1 and 1/2 tablets (total 18.75mg  ) twice a day by mouth 02/21/12  Yes Renella Cunas, MD  carvedilol (COREG) 12.5 MG tablet Take 18.75 mg by mouth 2 (two) times daily with a meal. 1 and 1/2 tablets   Yes Historical Provider, MD  furosemide (LASIX) 20 MG tablet Take 20 mg by mouth daily.  02/27/12  Yes Larey Dresser, MD  glipiZIDE (GLUCOTROL XL) 10 MG 24 hr tablet Take 1 tablet (10 mg total) by mouth 2 (two) times daily. 03/04/12  Yes Abner Greenspan, MD  levothyroxine (SYNTHROID, LEVOTHROID) 50 MCG tablet Take 50 mcg by mouth daily.   Yes Historical Provider, MD  lisinopril (PRINIVIL,ZESTRIL) 5 MG tablet Take 1 tablet (5 mg total) by mouth daily. 11/15/11 11/14/12 Yes Larey Dresser, MD  omeprazole (PRILOSEC) 20 MG capsule Take 20 mg by mouth daily. 07/31/11  Yes Abner Greenspan, MD   Physical Exam: Filed Vitals:   03/22/12 1400 03/22/12 1415 03/22/12 1545 03/22/12 1642  BP: 130/57 138/62 127/51 145/61  Pulse: 71 81 73 74  Temp:      TempSrc:      Resp:      SpO2: 89% 96% 93% 94%     General: No acute distress awake alert and oriented x3  Eyes: Anicteric  ENT: Moist mucous membranes  Neck: No JVD  Cardiovascular: Regular rate and rhythm with positive S1 and S2  Respiratory: Good air movement and clear to auscultation  Abdomen: Positive bowel sounds nontender nondistended  soft  Skin: No rashes or ulceration  Musculoskeletal: Intact  Psychiatric: Appropriate  Neurologic: Awake alert and oriented x4 coherent for language 3-12 are grossly intact sensation is intact throughout muscle strength is 5 over 5 in all 4 extremities deep tendon reflexes 2+ bilaterally.  Labs on Admission:  Basic Metabolic Panel:  Lab [id number]  NA 142  K 3.3*  CL 100  CO2 28  GLUCOSE 157*  BUN 34*  CREATININE 1.24*  CALCIUM 8.0*  MG --  PHOS --   Liver Function Tests: No results found for this basename: AST:5,ALT:5,ALKPHOS:5,BILITOT:5,PROT:5,ALBUMIN:5 in the last 168 hours No results found for this basename: LIPASE:5,AMYLASE:5 in the last 168 hours No results found  for this basename: AMMONIA:5 in the last 168 hours CBC:  Lab 03/22/12 0851  WBC 12.1*  NEUTROABS --  HGB 11.2*  HCT 33.8*  MCV 92.6  PLT 213   Cardiac Enzymes: No results found for this basename: CKTOTAL:5,CKMB:5,CKMBINDEX:5,TROPONINI:5 in the last 168 hours  BNP (last 3 results)  Basename 02/26/12 0951  PROBNP 176.0*   CBG: No results found for this basename: GLUCAP:5 in the last 168 hours  Radiological Exams on Admission: Dg Lumbar Spine Complete  03/22/2012  *RADIOLOGY REPORT*  Clinical Data: Low back pain for several days, no known injury  LUMBAR SPINE - COMPLETE 4+ VIEW  Comparison: Chest radiograph - 12/11/2011; chest CT - 08/25/2011  Findings:  There are five non-rib bearing lumbar type vertebral bodies.  Mild scoliotic curvature of the thoracolumbar spine with inferior curvature convex to the left, centered at the L3 - L4 intervertebral disc space.  No anterolisthesis or retrolisthesis. No definite pars defects.  The vertebral body heights are preserved.  Moderate DDD of L3 - L4 with disc space height loss, end plate irregularity and sclerosis and osteophytosis.  Limited visualization of the bilateral SI joints is normal.  Vascular calcifications. Calcified the tip diverticulum versus bone  island overlies the left lower abdominal quadrant.  Limited visualization of the lower thorax demonstrates sequela of prior median sternotomy and valve replacement.  IMPRESSION: Moderate DDD of L3 - L4.   Original Report Authenticated By: Jake Seats, MD    Ct Lumbar Spine Wo Contrast  03/22/2012  *RADIOLOGY REPORT*  Clinical Data: Low back pain for 10 days, worsening over the past 2 days.  No history of injury.  CT LUMBAR SPINE WITHOUT CONTRAST  Technique:  Multidetector CT imaging of the lumbar spine was performed without intravenous contrast administration. Multiplanar CT image reconstructions were also generated.  Comparison: Plain films lumbar spine 03/22/2012.  Findings: There is no fracture or subluxation of the lumbar spine. Convex left scoliosis with the apex at L3-4 is noted.  Imaged intra- abdominal contents demonstrate extensive atherosclerotic vascular disease but are otherwise unremarkable.  T12-L1:  Negative.  L1-2:  The disc is desiccated with a mild bulge but the central canal and foramina appear open.  L2-3:  There is some ligamentum flavum thickening and a broad-based disc bulge causing moderate central canal narrowing.  Neural foramina appear open.  L3-4:  Endplate sclerosis from degenerative change is noted.  There is a broad-based disc bulge, facet arthropathy and ligamentum flavum thickening.  Moderate to moderately severe central canal stenosis is present.  Mild bilateral foraminal narrowing is identified.  L4-5:  There is vacuum disc phenomenon.  Broad-based disc bulge, ligamentum flavum thickening and facet arthropathy cause severe appearing central canal stenosis.  Mild to moderate bilateral foraminal narrowing is identified.  L5-S1:  The patient has a mild disc bulge without central canal or foraminal narrowing.  IMPRESSION:  1.  Negative for fracture other acute abnormality. 2.  Multilevel degenerative change as described above appears worst at L4-5 where there is severe appearing  central canal and lateral recess narrowing.  Moderate to moderately severe central canal stenosis at L3-4 is also identified.   Original Report Authenticated By: Orlean Patten, M.D.     EKG: None  Assessment/Plan Acute back pain: - Admit her on observation start her on IV narcotics and by mouth narcotics. CT scan of the back showed no acute abnormalities. The patient cannot have an MRI due to the pacemaker. I will get physical therapy involved. Once her  back pain is better controlled we did discharge her home with PT and OT. I explained the plans of this tumor her sons and they agreed to  Barbara Garner, TYPE II - Continue home meds, start sliding scale insulin.   HYPERTENSION - Stable, continue current medications.   CONGESTIVE HEART FAILURE -Compensated continue current home meds.  CKD (chronic kidney disease) stage 4, GFR 15-29 ml/min - Check a basic metabolic panel in the morning. Currently at baseline. - Gentle IV fluids.   Code Status: full Family Communication: son's Disposition Plan: observation home in 24hrs. (indicate anticipated LOS)  Time spent: [address] Marguarite Arbour [facility] Pager [phone]  If 7PM-7AM, please contact night-coverage [email or web address] Password [facility] 03/22/2012, 4:46 PM

## 2012-03-22 NOTE — ED Notes (Signed)
Pt presents to ED via EMS with c/o of lower back pain for the past 10 days worse in the past 2 days. No history of fall. 168/78, 72. 5/10 pain.Barbara Garner

## 2012-03-22 NOTE — ED Provider Notes (Signed)
Medical screening examination/treatment/procedure(s) were conducted as a shared visit with non-physician practitioner(s) and myself.  I personally evaluated the patient during the encounter.  Pt examined in the ER.  No focal deficits noted.  She had considerable discomfort after medications given to treat back pain.  She was unable to walk.  Pt admitted for further mgmnt.  Perlie Mayo, MD 03/22/12 1721

## 2012-03-22 NOTE — ED Notes (Signed)
Pt to xray

## 2012-03-23 DIAGNOSIS — D509 Iron deficiency anemia, unspecified: Secondary | ICD-10-CM

## 2012-03-23 DIAGNOSIS — N184 Chronic kidney disease, stage 4 (severe): Secondary | ICD-10-CM

## 2012-03-23 DIAGNOSIS — I5032 Chronic diastolic (congestive) heart failure: Secondary | ICD-10-CM

## 2012-03-23 DIAGNOSIS — I509 Heart failure, unspecified: Secondary | ICD-10-CM

## 2012-03-23 LAB — BASIC METABOLIC PANEL
BUN: 38 mg/dL — ABNORMAL HIGH (ref 6–23)
CO2: 28 mEq/L (ref 19–32)
Chloride: 98 mEq/L (ref 96–112)
Creatinine, Ser: 1.4 mg/dL — ABNORMAL HIGH (ref 0.50–1.10)
GFR calc Af Amer: 41 mL/min — ABNORMAL LOW (ref >90)
Potassium: 3.4 mEq/L — ABNORMAL LOW (ref 3.5–5.1)

## 2012-03-23 MED ORDER — HYDROCODONE-ACETAMINOPHEN 5-325 MG PO TABS: 0.5000 | ORAL_TABLET | ORAL | Status: DC | PRN

## 2012-03-23 MED ORDER — METHOCARBAMOL 500 MG PO TABS: 500.0000 mg | ORAL_TABLET | Freq: Four times a day (QID) | ORAL | Status: DC | PRN

## 2012-03-23 MED ORDER — SENNA-DOCUSATE SODIUM 8.6-50 MG PO TABS: 1.0000 | ORAL_TABLET | Freq: Two times a day (BID) | ORAL | Status: DC | PRN

## 2012-03-23 MED ORDER — WHITE PETROLATUM GEL
Status: AC
  Administered 2012-03-23: 09:00:00
  Filled 2012-03-23: qty 5

## 2012-03-23 MED ORDER — METHOCARBAMOL 500 MG PO TABS
500.0000 mg | ORAL_TABLET | Freq: Four times a day (QID) | ORAL | Status: DC | PRN
  Administered 2012-03-23: 500 mg via ORAL
  Filled 2012-03-23: qty 1

## 2012-03-23 MED ORDER — POLYETHYLENE GLYCOL 3350 17 G PO PACK: 17.0000 g | PACK | Freq: Two times a day (BID) | ORAL | Status: DC | PRN

## 2012-03-23 NOTE — Progress Notes (Signed)
NCM notified [facility] Go of pt's d/c home with [facility]. Faxed orders to [facility] Intake. Added contact number to dc instructions. Son states pt has RW at home. Jonnie Finner RN CCM Case Mgmt phone [phone]

## 2012-03-23 NOTE — Progress Notes (Signed)
Physical Therapy Evaluation Patient Details Name: Barbara Garner MRN: [id number] DOB: 10/26/1934 Today's Date: 03/23/2012 Time: [id number] PT Time Calculation (min): 23 min  PT Assessment / Plan / Recommendation Clinical Impression  Pt is 78 yo female with onset of severe back pain several days ago which is incapacitating her from a mobility standpoint. On eval, pt unable to ambulate or transfer with a RW. Needed max A and frontal support for chair to bed transfer. Pt noted 10/10 pain. Pt will not be able to return home with sons in this condition, recommend SNF until pt becomes more mobile. PT will follow.    PT Assessment  Patient needs continued PT services    Follow Up Recommendations  SNF;Supervision/Assistance - 24 hour    Does the patient have the potential to tolerate intense rehabilitation      Barriers to Discharge Other (comment) unable to mobilize    Equipment Recommendations  Rolling walker with 5" wheels    Recommendations for Other Services OT consult   Frequency Min 3X/week    Precautions / Restrictions Precautions Precautions: Fall Restrictions Weight Bearing Restrictions: No   Pertinent Vitals/Pain 10/10 back pain, RN made aware      Mobility  Bed Mobility Bed Mobility: Sit to Supine Sit to Supine: 3: Mod assist Details for Bed Mobility Assistance: pt able to take trunk down to bed but unable to get legs into bed. Pt educated on log rolling for decreased pain due to disc bulge Transfers Transfers: Sit to Stand;Stand to Sit Sit to Stand: 2: Max assist;From chair/3-in-1;With upper extremity assist Stand to Sit: 2: Max assist;To bed;To chair/3-in-1;With upper extremity assist Stand Pivot Transfers: 3: Mod assist Details for Transfer Assistance: stood up to RW but pt unable to maintain standing, felt that knees were buckling and was crying out in pain. She reports that it does not feel like her legs will hold her. Also with small urinary incontinence (she  reports that this has been worse since being hospitalized). Unable to perform SPT transfer with RW so performed with therapist in front of patient and max A given to maintain standing. Pt was able to step feet to turn to bed Ambulation/Gait Ambulation/Gait Assistance: Not tested (comment) (unable ) Stairs: No Wheelchair Mobility Wheelchair Mobility: No    Shoulder Instructions     Exercises     PT Diagnosis: Difficulty walking;Acute pain  PT Problem List: Decreased strength;Decreased activity tolerance;Decreased balance;Decreased mobility;Decreased knowledge of use of DME;Decreased knowledge of precautions;Pain PT Treatment Interventions: DME instruction;Gait training;Functional mobility training;Therapeutic activities;Therapeutic exercise;Balance training;Neuromuscular re-education;Patient/family education   PT Goals Acute Rehab PT Goals PT Goal Formulation: With patient Time For Goal Achievement: 04/06/12 Potential to Achieve Goals: Fair Pt will go Supine/Side to Sit: with min assist PT Goal: Supine/Side to Sit - Progress: Goal set today Pt will go Sit to Supine/Side: with min assist PT Goal: Sit to Supine/Side - Progress: Goal set today Pt will go Sit to Stand: with min assist PT Goal: Sit to Stand - Progress: Goal set today Pt will go Stand to Sit: with min assist PT Goal: Stand to Sit - Progress: Goal set today Pt will Transfer Bed to Chair/Chair to Bed: with min assist PT Transfer Goal: Bed to Chair/Chair to Bed - Progress: Goal set today Pt will Ambulate: 16 - 50 feet;with min assist;with rolling walker PT Goal: Ambulate - Progress: Goal set today  Visit Information  Last PT Received On: 03/23/12 Assistance Needed: +1    Subjective Data  Subjective:  Having my children didn't hurt like this, I have never had pain like this in my life Patient Stated Goal: relief of pain   Prior [place] Lives With: Son Available Help at Discharge: Available 24  hours/day Type of Home: House Home Access: Stairs to enter Technical brewer of Steps: 3 Entrance Stairs-Rails: Can reach both;Left;Right Home Layout: One level Additional Comments: pt's son present, has some kind of developmental disability, lives with her other son as well who was not present during session Prior Function Level of Independence: Independent Able to Take Stairs?: Yes Vocation: Retired Comments: son answered most questions for her Communication Communication: No difficulties    Cognition  Overall Cognitive Status: Appears within functional limits for tasks assessed/performed Arousal/Alertness: Awake/alert Orientation Level: Appears intact for tasks assessed Behavior During Session: Anxious Cognition - Other Comments: pt's cognition appears WFL but mental state currently affected by pain    Extremity/Trunk Assessment Right Upper Extremity Assessment RUE ROM/Strength/Tone: [facility] for tasks assessed Left Upper Extremity Assessment LUE ROM/Strength/Tone: WFL for tasks assessed Right Lower Extremity Assessment RLE ROM/Strength/Tone: Deficits;Unable to fully assess;Due to pain RLE ROM/Strength/Tone Deficits: bilateral LE weakness on eval, seems mostly due to pain. However, pt had recent pacemaker placement and generalized weakness likely Left Lower Extremity Assessment LLE ROM/Strength/Tone: Deficits;Unable to fully assess;Due to pain LLE ROM/Strength/Tone Deficits: same as RLE Trunk Assessment Trunk Assessment: Kyphotic   Balance Balance Balance Assessed: Yes Static Standing Balance Static Standing - Balance Support: Bilateral upper extremity supported;During functional activity Static Standing - Level of Assistance: 2: Max assist  End of Session PT - End of Session Equipment Utilized During Treatment: Gait belt Activity Tolerance: Patient limited by pain Patient left: in bed;with call bell/phone within reach;with nursing in room;with family/visitor  present Nurse Communication: Mobility status;Patient requests pain meds  GP Functional Assessment Tool Used: clinical judgement Functional Limitation: Mobility: Walking and moving around Mobility: Walking and Moving Around Current Status [phone]): At least 60 percent but less than 80 percent impaired, limited or restricted Mobility: Walking and Moving Around Goal Status [phone]): At least 1 percent but less than 20 percent impaired, limited or restricted   Leighton Roach, PT  Acute Rehab Services  [phone]  Leighton Roach 03/23/2012, 12:01 PM

## 2012-03-23 NOTE — Progress Notes (Signed)
[facility] PROGRESS NOTE  Barbara Garner [id number] DOB: 01-27-1935 DOA: 03/22/2012 PCP: Loura Pardon, MD  Assessment/Plan: Acute low back pain:  - Has not received any pain medications during the night until this morning.  CT scan of the back showed no acute abnormalities. The patient cannot have an MRI due to the pacemaker. Continue physical therapy, arrange for home health PT at discharge. Continue pain with muscle relaxers and pain medications.  DIABETES MELLITUS, TYPE II  Continue home meds, start sliding scale insulin. Stable.  HYPERTENSION  - Stable, continue current medications.   Chronic diastolic congestive heart failure - Compensated continue current home meds. EF 65-70% on 2D ECHO on 09/15/2011.  CKD (chronic kidney disease) stage 3, GFR 15-29 ml/min  - Stable.  Renal function at baseline.  Hypothyroidism - Continue synthroid.  GERD - Continue PPI.  Aortic stenosis s/p aortic valve replacement (with 19 mm [facility] Ease pericardial tissue valve) - Stable  Anemia - Likely due to chronic disease.    Code Status: Full  Family Communication: Discussed with son at bedside. Disposition Plan: DC home today with pain control and home health PT.  Consultants:  None  Procedures:  None  Antibiotics:  None.  HPI/Subjective: Complaining of gurgling noise in her abdomen. Back pain improved, but has not gotten out of bed yet.  Objective: Filed Vitals:   03/22/12 1545 03/22/12 1642 03/22/12 2039 03/23/12 0710  BP: 127/51 145/61 134/57 136/52  Pulse: 73 74 83 77  Temp:   98.6 F (37 C) 98.4 F (36.9 C)  TempSrc:   Oral Axillary  Resp:   14 15  SpO2: 93% 94% 96% 100%   No intake or output data in the 24 hours ending 03/23/12 0950 There were no vitals filed for this visit.  Exam: Physical Exam: General: Awake, Oriented, No acute distress. HEENT: EOMI. Neck: Supple CV: S1 and S2 Lungs: Clear to ascultation bilaterally Abdomen: Soft,  Nontender, Nondistended, +bowel sounds. Ext: Good pulses. Trace edema.  Data Reviewed: Basic Metabolic Panel:  Lab [id number] 0750 03/22/12 1720 03/22/12 0851  NA 140 -- 142  K 3.4* -- 3.3*  CL 98 -- 100  CO2 28 -- 28  GLUCOSE 131* -- 157*  BUN 38* -- 34*  CREATININE 1.40* 1.34* 1.24*  CALCIUM 8.0* -- 8.0*  MG -- -- --  PHOS -- -- --   Liver Function Tests: No results found for this basename: AST:5,ALT:5,ALKPHOS:5,BILITOT:5,PROT:5,ALBUMIN:5 in the last 168 hours No results found for this basename: LIPASE:5,AMYLASE:5 in the last 168 hours No results found for this basename: AMMONIA:5 in the last 168 hours CBC:  Lab 03/22/12 1720 03/22/12 0851  WBC 11.9* 12.1*  NEUTROABS -- --  HGB 11.5* 11.2*  HCT 34.9* 33.8*  MCV 92.8 92.6  PLT 231 213   Cardiac Enzymes: No results found for this basename: CKTOTAL:5,CKMB:5,CKMBINDEX:5,TROPONINI:5 in the last 168 hours BNP (last 3 results)  Basename 02/26/12 0951  PROBNP 176.0*   CBG: No results found for this basename: GLUCAP:5 in the last 168 hours  No results found for this or any previous visit (from the past 240 hour(s)).   Studies: Dg Lumbar Spine Complete  03/22/2012  *RADIOLOGY REPORT*  Clinical Data: Low back pain for several days, no known injury  LUMBAR SPINE - COMPLETE 4+ VIEW  Comparison: Chest radiograph - 12/11/2011; chest CT - 08/25/2011  Findings:  There are five non-rib bearing lumbar type vertebral bodies.  Mild scoliotic curvature of the thoracolumbar spine with inferior curvature convex  to the left, centered at the L3 - L4 intervertebral disc space.  No anterolisthesis or retrolisthesis. No definite pars defects.  The vertebral body heights are preserved.  Moderate DDD of L3 - L4 with disc space height loss, end plate irregularity and sclerosis and osteophytosis.  Limited visualization of the bilateral SI joints is normal.  Vascular calcifications. Calcified the tip diverticulum versus bone island overlies the left lower  abdominal quadrant.  Limited visualization of the lower thorax demonstrates sequela of prior median sternotomy and valve replacement.  IMPRESSION: Moderate DDD of L3 - L4.   Original Report Authenticated By: Jake Seats, MD    Ct Lumbar Spine Wo Contrast  03/22/2012  *RADIOLOGY REPORT*  Clinical Data: Low back pain for 10 days, worsening over the past 2 days.  No history of injury.  CT LUMBAR SPINE WITHOUT CONTRAST  Technique:  Multidetector CT imaging of the lumbar spine was performed without intravenous contrast administration. Multiplanar CT image reconstructions were also generated.  Comparison: Plain films lumbar spine 03/22/2012.  Findings: There is no fracture or subluxation of the lumbar spine. Convex left scoliosis with the apex at L3-4 is noted.  Imaged intra- abdominal contents demonstrate extensive atherosclerotic vascular disease but are otherwise unremarkable.  T12-L1:  Negative.  L1-2:  The disc is desiccated with a mild bulge but the central canal and foramina appear open.  L2-3:  There is some ligamentum flavum thickening and a broad-based disc bulge causing moderate central canal narrowing.  Neural foramina appear open.  L3-4:  Endplate sclerosis from degenerative change is noted.  There is a broad-based disc bulge, facet arthropathy and ligamentum flavum thickening.  Moderate to moderately severe central canal stenosis is present.  Mild bilateral foraminal narrowing is identified.  L4-5:  There is vacuum disc phenomenon.  Broad-based disc bulge, ligamentum flavum thickening and facet arthropathy cause severe appearing central canal stenosis.  Mild to moderate bilateral foraminal narrowing is identified.  L5-S1:  The patient has a mild disc bulge without central canal or foraminal narrowing.  IMPRESSION:  1.  Negative for fracture other acute abnormality. 2.  Multilevel degenerative change as described above appears worst at L4-5 where there is severe appearing central canal and lateral recess  narrowing.  Moderate to moderately severe central canal stenosis at L3-4 is also identified.   Original Report Authenticated By: Orlean Patten, M.D.     Scheduled Meds:   . amLODipine  10 mg Oral Daily  . atorvastatin  40 mg Oral q1800  . carvedilol  18.75 mg Oral BID WC  . furosemide  20 mg Oral Daily  . glipiZIDE  10 mg Oral BID  . heparin  5,000 Units Subcutaneous Q8H  . levothyroxine  50 mcg Oral QAC breakfast  . lisinopril  5 mg Oral Daily  . pantoprazole  40 mg Oral Daily  . sodium chloride  3 mL Intravenous Q12H   Continuous Infusions:   Principal Problem:  *Acute back pain Active Problems:  DIABETES MELLITUS, TYPE II  HYPERTENSION  CONGESTIVE HEART FAILURE  Severe aortic stenosis  CKD (chronic kidney disease) stage 4, GFR 15-29 ml/min  S/P aortic valve replacement  Atrial fibrillation  Chronic diastolic CHF (congestive heart failure)   Vilma Will A  [facility] Pager [phone]. If 7PM-7AM, please contact night-coverage at [email or web address], password [facility] 03/23/2012, 9:50 AM  LOS: 1 day

## 2012-03-23 NOTE — Discharge Summary (Addendum)
Physician Discharge Summary  Barbara Garner [id number] DOB: July 03, 1934 DOA: 03/22/2012  PCP: Loura Pardon, MD  Admit date: 03/22/2012 Discharge date: 03/23/2012  Time spent: 25 minutes  Recommendations for Outpatient Follow-up:  Please follow up with Loura Pardon, MD (PCP) in 1 week.  Home health PT and social worker arranged for low back pain at discharge.  Discharge Diagnoses:  Principal Problem:  *Acute back pain Active Problems:  DIABETES MELLITUS, TYPE II  HYPERTENSION  CONGESTIVE HEART FAILURE  Severe aortic stenosis  CKD (chronic kidney disease) stage 4, GFR 15-29 ml/min  S/P aortic valve replacement  Atrial fibrillation  Chronic diastolic CHF (congestive heart failure)   Discharge Condition: Stable  Diet recommendation: Diabetic diet   History of present illness:  On admission "Barbara Garner is a 78 y.o. female with past medical history of chronic stomach heart failure, coronary disease status post CABG and diabetes type 2 that comes in for back pain, that started 2 days prior to admission progressively getting worse."   Hospital Course:  Acute low back pain:  - Improved during the hospital stay. CT scan of the back showed no acute abnormalities. The patient cannot have an MRI due to the pacemaker. Continue physical therapy, arrange for home health PT at discharge. Continue pain with muscle relaxers and pain medications for symptom management. Home health social worker arranged in case patient needs SNF.  DIABETES MELLITUS, TYPE II  Continue home meds, start sliding scale insulin. Stable.  HYPERTENSION  - Stable, continue current medications.   Chronic diastolic congestive heart failure - Compensated continue current home meds. EF 65-70% on 2D ECHO on 09/15/2011.  CKD (chronic kidney disease) stage 3, GFR 15-29 ml/min  - Stable.  Renal function at baseline.  Hypothyroidism - Continue synthroid.  GERD - Continue PPI.  Aortic stenosis s/p aortic valve  replacement (with 19 mm [facility] Ease pericardial tissue valve) - Stable  Anemia - Likely due to chronic disease. Hemoglobin stable.  Procedures:  None.  Consultations:  None.  Discharge Exam: Filed Vitals:   03/22/12 1545 03/22/12 1642 03/22/12 2039 03/23/12 0710  BP: 127/51 145/61 134/57 136/52  Pulse: 73 74 83 77  Temp:   98.6 F (37 C) 98.4 F (36.9 C)  TempSrc:   Oral Axillary  Resp:   14 15  SpO2: 93% 94% 96% 100%   Discharge Instructions  Discharge Orders    Future Appointments: Provider: Department: Dept Phone: Center:   03/28/2012 9:25 AM [facility] [id number]   05/29/2012 11:00 AM [facility] [id number] None   07/12/2012 12:30 PM Hayden Pedro, MD [place] [phone] None     Future Orders Please Complete By Expires   Diet Carb Modified      Increase activity slowly      Discharge instructions      Comments:   Please follow up with Loura Pardon, MD (PCP) in 1 week.       Medication List     As of 03/23/2012 10:04 AM    TAKE these medications         albuterol 108 (90 BASE) MCG/ACT inhaler   Commonly known as: PROVENTIL HFA;VENTOLIN HFA   Inhale 2 puffs into the lungs every 4 (four) hours as needed. For shortness of breath.      amLODipine 10 MG tablet   Commonly known as: NORVASC   Take 10 mg by mouth daily.  atorvastatin 40 MG tablet   Commonly known as: LIPITOR   TAKE 1 TABLET BY MOUTH EVERY DAY      carvedilol 12.5 MG tablet   Commonly known as: COREG   Take 18.75 mg by mouth 2 (two) times daily with a meal. 1 and 1/2 tablets      furosemide 20 MG tablet   Commonly known as: LASIX   Take 20 mg by mouth daily.      glipiZIDE 10 MG 24 hr tablet   Commonly known as: GLUCOTROL XL   Take 1 tablet (10 mg total) by mouth 2 (two) times daily.      HYDROcodone-acetaminophen 5-325 MG per tablet   Commonly known as: NORCO/VICODIN   Take  0.5-1 tablets by mouth every 4 (four) hours as needed for pain.      levothyroxine 50 MCG tablet   Commonly known as: SYNTHROID, LEVOTHROID   Take 50 mcg by mouth daily.      lisinopril 5 MG tablet   Commonly known as: PRINIVIL,ZESTRIL   Take 1 tablet (5 mg total) by mouth daily.      methocarbamol 500 MG tablet   Commonly known as: ROBAXIN   Take 1 tablet (500 mg total) by mouth every 6 (six) hours as needed (Muscle spasm/Back pain).      omeprazole 20 MG capsule   Commonly known as: PRILOSEC   Take 20 mg by mouth daily.      polyethylene glycol packet   Commonly known as: MIRALAX / GLYCOLAX   Take 17 g by mouth 2 (two) times daily as needed (Constipation).      sennosides-docusate sodium 8.6-50 MG tablet   Commonly known as: SENOKOT-S   Take 1 tablet by mouth 2 (two) times daily as needed for constipation.          The results of significant diagnostics from this hospitalization (including imaging, microbiology, ancillary and laboratory) are listed below for reference.    Significant Diagnostic Studies: Dg Lumbar Spine Complete  03/22/2012  *RADIOLOGY REPORT*  Clinical Data: Low back pain for several days, no known injury  LUMBAR SPINE - COMPLETE 4+ VIEW  Comparison: Chest radiograph - 12/11/2011; chest CT - 08/25/2011  Findings:  There are five non-rib bearing lumbar type vertebral bodies.  Mild scoliotic curvature of the thoracolumbar spine with inferior curvature convex to the left, centered at the L3 - L4 intervertebral disc space.  No anterolisthesis or retrolisthesis. No definite pars defects.  The vertebral body heights are preserved.  Moderate DDD of L3 - L4 with disc space height loss, end plate irregularity and sclerosis and osteophytosis.  Limited visualization of the bilateral SI joints is normal.  Vascular calcifications. Calcified the tip diverticulum versus bone island overlies the left lower abdominal quadrant.  Limited visualization of the lower thorax  demonstrates sequela of prior median sternotomy and valve replacement.  IMPRESSION: Moderate DDD of L3 - L4.   Original Report Authenticated By: Jake Seats, MD    Ct Lumbar Spine Wo Contrast  03/22/2012  *RADIOLOGY REPORT*  Clinical Data: Low back pain for 10 days, worsening over the past 2 days.  No history of injury.  CT LUMBAR SPINE WITHOUT CONTRAST  Technique:  Multidetector CT imaging of the lumbar spine was performed without intravenous contrast administration. Multiplanar CT image reconstructions were also generated.  Comparison: Plain films lumbar spine 03/22/2012.  Findings: There is no fracture or subluxation of the lumbar spine. Convex left scoliosis with the apex at L3-4  is noted.  Imaged intra- abdominal contents demonstrate extensive atherosclerotic vascular disease but are otherwise unremarkable.  T12-L1:  Negative.  L1-2:  The disc is desiccated with a mild bulge but the central canal and foramina appear open.  L2-3:  There is some ligamentum flavum thickening and a broad-based disc bulge causing moderate central canal narrowing.  Neural foramina appear open.  L3-4:  Endplate sclerosis from degenerative change is noted.  There is a broad-based disc bulge, facet arthropathy and ligamentum flavum thickening.  Moderate to moderately severe central canal stenosis is present.  Mild bilateral foraminal narrowing is identified.  L4-5:  There is vacuum disc phenomenon.  Broad-based disc bulge, ligamentum flavum thickening and facet arthropathy cause severe appearing central canal stenosis.  Mild to moderate bilateral foraminal narrowing is identified.  L5-S1:  The patient has a mild disc bulge without central canal or foraminal narrowing.  IMPRESSION:  1.  Negative for fracture other acute abnormality. 2.  Multilevel degenerative change as described above appears worst at L4-5 where there is severe appearing central canal and lateral recess narrowing.  Moderate to moderately severe central canal stenosis  at L3-4 is also identified.   Original Report Authenticated By: Orlean Patten, M.D.     Microbiology: No results found for this or any previous visit (from the past 240 hour(s)).   Labs: Basic Metabolic Panel:  Lab [id number] 0750 03/22/12 1720 03/22/12 0851  NA 140 -- 142  K 3.4* -- 3.3*  CL 98 -- 100  CO2 28 -- 28  GLUCOSE 131* -- 157*  BUN 38* -- 34*  CREATININE 1.40* 1.34* 1.24*  CALCIUM 8.0* -- 8.0*  MG -- -- --  PHOS -- -- --   Liver Function Tests: No results found for this basename: AST:5,ALT:5,ALKPHOS:5,BILITOT:5,PROT:5,ALBUMIN:5 in the last 168 hours No results found for this basename: LIPASE:5,AMYLASE:5 in the last 168 hours No results found for this basename: AMMONIA:5 in the last 168 hours CBC:  Lab 03/22/12 1720 03/22/12 0851  WBC 11.9* 12.1*  NEUTROABS -- --  HGB 11.5* 11.2*  HCT 34.9* 33.8*  MCV 92.8 92.6  PLT 231 213   Cardiac Enzymes: No results found for this basename: CKTOTAL:5,CKMB:5,CKMBINDEX:5,TROPONINI:5 in the last 168 hours BNP: BNP (last 3 results)  Basename 02/26/12 0951  PROBNP 176.0*   CBG: No results found for this basename: GLUCAP:5 in the last 168 hours     Signed:  Tahsin Benyo A  [facility] 03/23/2012, 10:04 AM

## 2012-03-28 ENCOUNTER — Other Ambulatory Visit: Payer: Medicare Other

## 2012-04-05 ENCOUNTER — Ambulatory Visit (INDEPENDENT_AMBULATORY_CARE_PROVIDER_SITE_OTHER): Payer: Medicare Other | Admitting: Family Medicine

## 2012-04-05 ENCOUNTER — Encounter: Payer: Self-pay | Admitting: Family Medicine

## 2012-04-05 VITALS — BP 139/64 | HR 65 | Ht 60.0 in | Wt 101.0 lb

## 2012-04-05 DIAGNOSIS — L940 Localized scleroderma [morphea]: Secondary | ICD-10-CM

## 2012-04-05 DIAGNOSIS — N904 Leukoplakia of vulva: Secondary | ICD-10-CM | POA: Insufficient documentation

## 2012-04-05 MED ORDER — CLOBETASOL PROPIONATE 0.05 % EX CREA: TOPICAL_CREAM | Freq: Two times a day (BID) | CUTANEOUS | Status: DC

## 2012-04-05 NOTE — Patient Instructions (Signed)
View All  Adult female external genitalia  Healthy vulval hygiene practices  The Basics  Patient information: Vulvar itching (The Basics) Patient information: Vulvar pain (The Basics) Beyond the Basics  Patient information: Lichen sclerosus (Beyond the Basics) Patient information: Lichen sclerosus (The Basics)Written by the doctors and editors at UpToDate  What is lichen sclerosus? - Lichen sclerosus is a condition that causes white patches on the skin. It usually happens on the genitals, where it can also cause itching or pain. It is more common in women than in men. Women are most likely to get lichen sclerosus before puberty or after menopause. Puberty is the time in a girl's life when she starts getting periods. Menopause is the time in a woman's life when she stops getting them. But adult women who still have periods can also get lichen sclerosus.  What are the symptoms of lichen sclerosus? - In women and girls, symptoms can include: Itching of the vulva - This is the area around the opening of the vagina (figure 1).  Itching, bleeding, or pain around the anus - This is the opening where bowel movements come out.  Discomfort or dull pain in the vulva  Fluid that comes out of the vagina - Doctors call this "vaginal discharge."  Pain during sex or urination  Skin changes around the vulva or anus, such as:  White, wrinkled skin  Bruises  Cracked skin that sometimes bleeds  In men, lichen sclerosus is most common in men who are not circumcised. Men who are not circumcised have extra skin over the tip of the penis. The skin is called the "foreskin." Symptoms in men can include:  A tight foreskin - It might be hard to pull back for cleaning.  Skin changes on the foreskin that look like scars  A penis that is less sensitive than usual  Painful erections  Trouble urinating  Lichen sclerosus can cause white patches in areas other than the genitals. These can happen in women and men, but  children do not usually get them. The patches are most common on the upper body. People with dark skin might have patches that are darker or lighter than their normal skin. Should I see a doctor or nurse? - See your doctor or nurse if:  You or your child has itching, bleeding, or pain in the vulva or penis  You have white, light, or dark patches on your skin  Will I need tests? - Yes. Your doctor or nurse will do an exam and learn about your symptoms. Adults with lichen sclerosus often have a test called a "biopsy." In this test, a doctor takes a small sample of skin from the area with symptoms. Another doctor looks at the sample under a microscope. It can show if lichen sclerosus or a different condition is causing symptoms. Children do not usually need a biopsy.  People with lichen sclerosus sometimes have other health conditions. The doctor might do tests for these, such as:  Tests on a sample of fluid from the vagina, for women - These tests can show if there is an infection in the vagina.  Blood tests - These can show signs of other medical conditions. How is lichen sclerosus treated? - Doctors cannot get rid of lichen sclerosus that affects the vulva, but they can treat it. It is important for women with this condition to get treatment as soon as possible. Lichen sclerosus that is not treated can make scars form in the vulva. The scars  can cause permanent damage.  Lichen sclerosus treatments for women include: Learning how to keep the vulva healthy (table 1)  Medicines to relieve symptoms and keep scars from forming - These can be an ointment to put on the skin, a shot, or pills taken by mouth.  Surgery to remove scar tissue, if scars have formed. Lichen sclerosus treatments for men include medicines and surgery. The most common surgery is called "circumcision." In this surgery, a doctor removes the foreskin.  Lichen sclerosus that is not on the vulva or penis might not need treatment. If it  bothers you, your doctor or nurse can suggest different treatments to relieve symptoms.  What will my life be like? - Lichen sclerosus is usually a lifelong condition. Adults who get lichen sclerosus on the genitals have a higher risk of cancer in the genital area. They should see a doctor or nurse at least 1 or 2 times a year. The doctor will check for cancer and any other changes. Women should check the vulva every month for any changes. A doctor or nurse can show the right way to check.  Doctors do not think children who get lichen sclerosus have the same cancer risk as adults who get it.  More on this topic Patient information: Vulvar itching (The Basics) Patient information: Vulvar pain (The Basics) Patient information: Lichen sclerosus (Beyond the Basics) All topics are updated as new evidence becomes available and our peer review process is complete.  This topic retrieved from UpToDate on: Apr 05, 2012.  FindPrintEmail  The content on the UpToDate website is not intended nor recommended as a substitute for medical advice, diagnosis, or treatment. Always seek the advice of your own physician or other qualified health care professional regarding any medical questions or conditions. The use of UpToDate content is governed by the UpToDate Terms of Use. 2014 [employer], [employer] rights reserved.  Topic [phone] Version 1.0  GRAPHICS Adult female external genitalia   This drawing shows the parts of a woman's genitals.  Healthy vulval hygiene practices  Do not use  Instead use  Pantyhose  Stockings with a garter belt   Thigh high or knee high stockings  Synthetic underwear Cotton underwear or no underwear  Jeans and other tight pants Loose pants, skirts, dresses  Swimsuits, leotards, thongs, or lycra clothes Loose-fitting cotton clothes  Pantyliners Tampons or cotton pads  Scented soaps or shampoos Fragrance free pH neutral soap (such as Basis, Neutrogena, Dove soap)  Bubble bath Tub baths in  the morning and at night with nothing added to the water and at a comfortable temperature  Scented detergents Unscented detergents  Washcloths Use fingertips for washing  Feminine sprays, douches, powders These are not necessary. Do not use them.   Dyed toilet paper and other products Toilet paper and other products without dyes  Hair dryers to dry vulva skin without contact

## 2012-04-08 NOTE — Assessment & Plan Note (Signed)
Start clobetasol, twice daily, then daily, then continue twice weekly ad infinitum.

## 2012-04-08 NOTE — Progress Notes (Signed)
  Subjective:    Patient ID: Barbara Garner, female    DOB: Jul 18, 1934, 78 y.o.   MRN: [id number]  HPI  Comes in today for f/u.  Reports severe vulvar itching.  Was seen prev. With itching and had bx confirmed lichen sclerosis in March.  Placed on clobetasol, which helped, but she stopped taking it once she ran out.  The itching has returned with a vengeance.    Review of Systems  Constitutional: Negative for fever and chills.  Respiratory: Negative for shortness of breath.   Cardiovascular: Negative for chest pain.  Gastrointestinal: Negative for abdominal pain.  Genitourinary: Negative for vaginal bleeding.  Neurological: Negative for headaches.       Objective:   Physical Exam  Vitals reviewed. Constitutional: She appears well-developed and well-nourished.  HENT:  Head: Normocephalic and atraumatic.  Eyes: No scleral icterus.  Neck: Neck supple.  Cardiovascular: Normal rate.   Genitourinary: There is rash and lesion on the right labia. There is rash and lesion on the left labia.       Marked erythema, bilaterally          Assessment & Plan:

## 2012-04-18 ENCOUNTER — Other Ambulatory Visit: Payer: Self-pay | Admitting: Family Medicine

## 2012-07-12 ENCOUNTER — Ambulatory Visit (INDEPENDENT_AMBULATORY_CARE_PROVIDER_SITE_OTHER): Payer: Medicare Other | Admitting: Ophthalmology

## 2012-07-20 ENCOUNTER — Other Ambulatory Visit: Payer: Self-pay | Admitting: Family Medicine

## 2012-07-22 NOTE — Telephone Encounter (Signed)
Please schedule f/u appt in summer and refil until then

## 2012-07-22 NOTE — Telephone Encounter (Signed)
No recent appt and no future appt, Ok to refill?

## 2012-08-13 ENCOUNTER — Other Ambulatory Visit: Payer: Self-pay | Admitting: Family Medicine

## 2012-08-13 NOTE — Telephone Encounter (Signed)
No recent appt and no future appt ,called pt to schedule an no answer, left voicemail requesting pt to call office

## 2012-08-14 NOTE — Telephone Encounter (Signed)
Pt said she doesn't drive anymore so she will check with her friend that drives her to her appts and see what day works for them and call us back to schedule an appt, I advise pt once that appt is schedule we will refill medication

## 2012-08-19 ENCOUNTER — Other Ambulatory Visit: Payer: Medicare Other | Admitting: *Deleted

## 2012-08-19 ENCOUNTER — Other Ambulatory Visit: Payer: Self-pay | Admitting: Family Medicine

## 2012-08-19 NOTE — Telephone Encounter (Signed)
Called pt to check and see if she had spoke with family/friends to see if they can bring her for a f/u appt. 1st pt said she would have to check with family and still hasn't spoke to anyone regarding getting an ride to an appt. Then pt said she doesn't want to go to Dr. Glori Bickers any longer and will get her gyn doctor to prescribe medication, I advise pt that her gyn may not prescribe synthroid and it usually goes through your PCP, pt said she is only going to her gyn and doesn't need to see Dr. Glori Bickers and I don't need to refill medication anymore. I declined medication but advise pt that if she changes her mind she can call for a f/u appt and we can refill medication at that time, pt verbalized understanding

## 2012-08-19 NOTE — Progress Notes (Signed)
Patient comes in today after receiving a message from the [facility] on [address] that she needed to be seen by [place] for a uti.  After contacting the [facility] center and speaking with the nurse she feels that there has been a misunderstanding as the patient has a prescription for Keflex that was called into her pharmacy on the 29th.  I spoke to the patient and she did receive this rx already and did not realize that this is what it was for.  She has already started taking the Keflex and will follow up with the [facility] center if there are any further problems.  I attempted to collect a sample for the patient but she was unable to give me a sample.

## 2012-08-19 NOTE — Telephone Encounter (Signed)
Aware, thanks!

## 2012-08-20 NOTE — Telephone Encounter (Addendum)
I have discussed in detail with this pt that she was due for a f/u appt before we can refill her meds pt declined twice to scheduled an appt and told me to decline her medication, pharmacy resent Rx request and I call pt today, pt does want to see Dr. Glori Bickers and she said she will check with her friend to see when she can bring her up here and call back, pt isn't out of meds she has a whole month left so will get appt scheduled before she is out

## 2012-08-21 NOTE — Telephone Encounter (Signed)
Pt scheduled appt for 08/23/12 and med refilled x1 month

## 2012-08-23 ENCOUNTER — Encounter: Payer: Self-pay | Admitting: Family Medicine

## 2012-08-23 ENCOUNTER — Ambulatory Visit (INDEPENDENT_AMBULATORY_CARE_PROVIDER_SITE_OTHER): Payer: Medicare Other | Admitting: Family Medicine

## 2012-08-23 VITALS — BP 132/80 | HR 60 | Temp 97.5°F | Ht 60.0 in | Wt 112.0 lb

## 2012-08-23 DIAGNOSIS — E039 Hypothyroidism, unspecified: Secondary | ICD-10-CM

## 2012-08-23 DIAGNOSIS — E559 Vitamin D deficiency, unspecified: Secondary | ICD-10-CM | POA: Insufficient documentation

## 2012-08-23 DIAGNOSIS — E7800 Pure hypercholesterolemia, unspecified: Secondary | ICD-10-CM

## 2012-08-23 DIAGNOSIS — E119 Type 2 diabetes mellitus without complications: Secondary | ICD-10-CM

## 2012-08-23 DIAGNOSIS — I10 Essential (primary) hypertension: Secondary | ICD-10-CM

## 2012-08-23 LAB — COMPREHENSIVE METABOLIC PANEL
ALT: 12 U/L (ref 0–35)
CO2: 28 mEq/L (ref 19–32)
Calcium: 9.1 mg/dL (ref 8.4–10.5)
Chloride: 109 mEq/L (ref 96–112)
GFR: 40.83 mL/min — ABNORMAL LOW (ref >60.00)
Glucose, Bld: 131 mg/dL — ABNORMAL HIGH (ref 70–99)
Sodium: 141 mEq/L (ref 135–145)
Total Bilirubin: 0.6 mg/dL (ref 0.3–1.2)
Total Protein: 7.4 g/dL (ref 6.0–8.3)

## 2012-08-23 LAB — LIPID PANEL
LDL Cholesterol: 79 mg/dL (ref 0–99)
Total CHOL/HDL Ratio: 4
Triglycerides: 123 mg/dL (ref 0.0–149.0)

## 2012-08-23 LAB — TSH: TSH: 2.58 u[IU]/mL (ref 0.35–5.50)

## 2012-08-23 NOTE — Patient Instructions (Addendum)
I reviewed your medicines today-no changes are necessary Labs today  Take care of yourself  Try to stay as active as you can Follow up in 6 months for annual exam with labs prior Please call [facility] to see when pt is due to see Dr Hinton Rao

## 2012-08-23 NOTE — Progress Notes (Signed)
Subjective:    Patient ID: Barbara Garner, female    DOB: [birth year], 78 y.o.   MRN: [id number]  HPI Here for f/u of chronic medical problems  Stays pretty tired   Seems to have a lot of confusion about medicines today  Family had been worried about memory previously Thinks she had some long term memory loss after that  She thinks her memory was very bad after her surgery but is significantly better now   Lives with her handicapped son and another son also (she wants him out of the house)   Wt is up 11 lb Not swelling  Is eating very well - too well  She does watch sugar in diet   Was due for f/u cardiol April- Dr Aundra Dubin Wanted to send to cardiac rehab- but she thinks she did not need it  Had a visiting nurse for a while   Due for a1c for DM  bp is stable today  No cp or palpitations or headaches or edema  No side effects to medicines  BP Readings from Last 3 Encounters:  08/23/12 132/80  04/05/12 139/64  03/23/12 136/52      Due to check her tsh also   Patient Active Problem List   Diagnosis Date Noted  . Vitamin D deficiency disease 08/23/2012  . Lichen sclerosus et atrophicus of the vulva 04/05/2012  . Acute back pain 03/22/2012  . Chronic diastolic CHF (congestive heart failure) 11/15/2011  . Pacemaker-[employer] 09/13/2011  . Atrial fibrillation 09/12/2011  . Complete heart block 09/09/2011  . S/P aortic valve replacement 09/06/2011  . S/P CABG x 3 09/06/2011  . Severe aortic stenosis   . LBBB (left bundle branch block)   . CKD (chronic kidney disease) stage 4, GFR 15-29 ml/min   . Carotid stenosis 05/30/2011  . Squamous cell carcinoma of skin 07/21/2010  . Neoplasm of uncertain behavior of skin 07/19/2010  . INTERTRIGO, CANDIDAL 03/24/2010  . MEMORY LOSS 08/19/2009  . HYPERCHOLESTEROLEMIA, PURE 09/20/2006  . HYPOTHYROIDISM 06/27/2006  . DIABETES MELLITUS, TYPE II 06/27/2006  . ANEMIA-IRON DEFICIENCY 06/27/2006  . PERIPHERAL NEUROPATHY 06/27/2006  .  HYPERTENSION 06/27/2006  . MYOCARDIAL INFARCTION, HX OF 06/27/2006  . CORONARY ARTERY DISEASE 06/27/2006  . CONGESTIVE HEART FAILURE 06/27/2006  . ASTHMA 06/27/2006  . GERD 06/27/2006  . PNEUMONIA, HX OF 06/27/2006  . ECZEMA 06/27/2006   Past Medical History  Diagnosis Date  . Iron deficiency anemia, unspecified   . Chronic diastolic CHF (congestive heart failure)   . CAD  S/P CABG x 3 08/2011     LIMA to diagonal branch, SVG to OM1, SVG to PDA, EVH via bilateral thighs  . Type II or unspecified type diabetes mellitus without mention of complication, not stated as uncontrolled   . Contact dermatitis and other eczema, due to unspecified cause   . Esophageal reflux   . Headache(784.0)   . Pacemaker MDT dual chamber     DOI 08/2011  . Pure hypercholesterolemia   . hypertension   . Hypothyroidism   . Atrioventricular block, complete -intermittent   . Memory loss   . Idiopathic peripheral neuropathy   . LBBB (left bundle branch block)   . Carotid stenosis     Carotid dopplers 0000000 with [id number] LICA stenosis  . Severe aortic stenosis -S/P AVR      19 mm [facility] Ease pericardial tissue valve  . Extrinsic asthma, unspecified     hasn't used inhaler in past year  . CKD (  chronic kidney disease) stage 4, GFR 15-29 ml/min     Cr 1.6 in 6/11, sees Dr. Arty Baumgartner  . Impaired vision     pt. reports that she identifies her meds by looking at the pillls, she is not able to read labels on bottles   Past Surgical History  Procedure Laterality Date  . Ptca  [phone]    5 blockages  . Cardiac catheterization    . Esophageal dilation  1997  . Colonoscopy  04/1999    1 polyp  . Esophagogastroduodenoscopy  04/1999    HH; "watermelon stomach" (severe gastritis)  . Dexa  10/2003 ,09/2005    osteoporosis,  Osteopenia  . Cataract extraction, bilateral  2003  . Esophagogastroduodenoscopy  04/2002    Gastritis  . Cardiovascular stress test  10/2002    normal (per patient)  . Abdominal  hysterectomy  1977    partial; fibroids  . Bladder tack  1977  . Colonoscopy  06/2004    Neg. Int hem  . Carotid dopplers  2006  . Refractive surgery  2006  . Opthy  11/1998;12/01;11/02  . Aortic valve replacement  09/06/2011    Procedure: AORTIC VALVE REPLACEMENT (AVR);  Surgeon: Rexene Alberts, MD;  Location: [place];  Service: Open Heart Surgery;  Laterality: N/A;  . Coronary artery bypass graft  09/06/2011    Procedure: CORONARY ARTERY BYPASS GRAFTING (CABG);  Surgeon: Rexene Alberts, MD;  Location: [place];  Service: Open Heart Surgery;  Laterality: N/A;   History  Substance Use Topics  . Smoking status: Never Smoker   . Smokeless tobacco: Not on file  . Alcohol Use: No   Family History  Problem Relation Age of Onset  . Stroke Father     died in his 1's  . Stroke Mother     died in her 11's   Allergies  Allergen Reactions  . Metformin     REACTION: intolerant  . Promethazine Hcl     REACTION: u/k   Current Outpatient Prescriptions on File Prior to Visit  Medication Sig Dispense Refill  . amLODipine (NORVASC) 10 MG tablet Take 10 mg by mouth daily.       Marland Kitchen atorvastatin (LIPITOR) 40 MG tablet TAKE 1 TABLET BY MOUTH EVERY DAY  30 tablet  5  . glipiZIDE (GLUCOTROL XL) 10 MG 24 hr tablet Take 1 tablet (10 mg total) by mouth 2 (two) times daily.  60 tablet  3  . lisinopril (PRINIVIL,ZESTRIL) 5 MG tablet Take 1 tablet (5 mg total) by mouth daily.  90 tablet  3  . omeprazole (PRILOSEC) 20 MG capsule TAKE 1 CAPSULE BY MOUTH DAILY  30 capsule  0  . SYNTHROID 50 MCG tablet TAKE 1 TABLET EVERY DAY  30 tablet  0  . albuterol (PROVENTIL HFA;VENTOLIN HFA) 108 (90 BASE) MCG/ACT inhaler Inhale 2 puffs into the lungs every 4 (four) hours as needed. For shortness of breath.      . polyethylene glycol (MIRALAX / GLYCOLAX) packet Take 17 g by mouth 2 (two) times daily as needed (Constipation).  60 each  0   No current facility-administered medications on file prior to visit.    Review of  Systems Review of Systems  Constitutional: Negative for fever, appetite change, fatigue and unexpected weight change.  Eyes: Negative for pain and visual disturbance.  Respiratory: Negative for cough and shortness of breath.   Cardiovascular: Negative for cp or palpitations    Gastrointestinal: Negative for nausea, diarrhea and constipation.  Genitourinary: Negative for urgency and frequency.  Skin: Negative for pallor or rash   Neurological: Negative for weakness, light-headedness, numbness and headaches.  Hematological: Negative for adenopathy. Does not bruise/bleed easily.  Psychiatric/Behavioral: Negative for dysphoric mood. The patient is not nervous/anxious.         Objective:   Physical Exam  Constitutional: She appears well-developed and well-nourished. No distress.  HENT:  Head: Normocephalic and atraumatic.  Right Ear: External ear normal.  Left Ear: External ear normal.  Nose: Nose normal.  Mouth/Throat: Oropharynx is clear and moist.  Eyes: Conjunctivae and EOM are normal. Pupils are equal, round, and reactive to light. No scleral icterus.  Neck: Normal range of motion. Neck supple. No JVD present. Carotid bruit is not present. No thyromegaly present.  Cardiovascular: Normal rate, regular rhythm, normal heart sounds and intact distal pulses.  Exam reveals no gallop.   Pulmonary/Chest: Effort normal and breath sounds normal. No respiratory distress. She has no wheezes. She has no rales.  Abdominal: Soft. Bowel sounds are normal. She exhibits no distension, no abdominal bruit and no mass. There is no tenderness.  Musculoskeletal: She exhibits no edema and no tenderness.  Lymphadenopathy:    She has no cervical adenopathy.  Neurological: She is alert. She has normal reflexes. No cranial nerve deficit. She exhibits normal muscle tone. Coordination normal.  Skin: Skin is warm and dry. No rash noted. No erythema. No pallor.  Psychiatric: She has a normal mood and affect.  Pt  seems mentally sharp upon conversation Rev medicines in detail and she does remember which to take and which ones were stopped            Assessment & Plan:

## 2012-08-24 LAB — VITAMIN D 25 HYDROXY (VIT D DEFICIENCY, FRACTURES): Vit D, 25-Hydroxy: 29 ng/mL — ABNORMAL LOW (ref 30–89)

## 2012-08-25 NOTE — Assessment & Plan Note (Signed)
D level today  Disc imp to bone and overall health   

## 2012-08-25 NOTE — Assessment & Plan Note (Signed)
Lab today Rev low glycemic diet and lifestyle habits

## 2012-08-25 NOTE — Assessment & Plan Note (Signed)
tsh today No clinical changes

## 2012-08-25 NOTE — Assessment & Plan Note (Signed)
bp in fair control at this time  No changes needed  Disc lifstyle change with low sodium diet and exercise  Reviewed medicines in detail today Labs ordered

## 2012-08-25 NOTE — Assessment & Plan Note (Signed)
Disc goals for lipids and reasons to control them Lipids today Rev low sat fat diet in detail

## 2012-08-29 ENCOUNTER — Encounter: Payer: Self-pay | Admitting: *Deleted

## 2012-09-10 ENCOUNTER — Other Ambulatory Visit: Payer: Self-pay | Admitting: Cardiology

## 2012-09-19 ENCOUNTER — Other Ambulatory Visit: Payer: Self-pay | Admitting: *Deleted

## 2012-09-19 MED ORDER — ATORVASTATIN CALCIUM 40 MG PO TABS: ORAL_TABLET | ORAL | Status: DC

## 2012-09-19 MED ORDER — OMEPRAZOLE 20 MG PO CPDR: DELAYED_RELEASE_CAPSULE | ORAL | Status: DC

## 2012-09-19 MED ORDER — SYNTHROID 50 MCG PO TABS: ORAL_TABLET | ORAL | Status: DC

## 2012-09-23 ENCOUNTER — Encounter: Payer: Self-pay | Admitting: *Deleted

## 2012-09-24 ENCOUNTER — Encounter: Payer: Self-pay | Admitting: Internal Medicine

## 2012-09-24 ENCOUNTER — Other Ambulatory Visit: Payer: Self-pay | Admitting: *Deleted

## 2012-09-24 ENCOUNTER — Ambulatory Visit (INDEPENDENT_AMBULATORY_CARE_PROVIDER_SITE_OTHER): Payer: Medicare Other | Admitting: Internal Medicine

## 2012-09-24 VITALS — BP 121/49 | HR 60 | Ht 61.0 in | Wt 112.0 lb

## 2012-09-24 DIAGNOSIS — I442 Atrioventricular block, complete: Secondary | ICD-10-CM

## 2012-09-24 DIAGNOSIS — I498 Other specified cardiac arrhythmias: Secondary | ICD-10-CM

## 2012-09-24 DIAGNOSIS — Z950 Presence of cardiac pacemaker: Secondary | ICD-10-CM

## 2012-09-24 DIAGNOSIS — I5032 Chronic diastolic (congestive) heart failure: Secondary | ICD-10-CM

## 2012-09-24 DIAGNOSIS — I4891 Unspecified atrial fibrillation: Secondary | ICD-10-CM

## 2012-09-24 DIAGNOSIS — R001 Bradycardia, unspecified: Secondary | ICD-10-CM

## 2012-09-24 DIAGNOSIS — I509 Heart failure, unspecified: Secondary | ICD-10-CM

## 2012-09-24 LAB — PACEMAKER DEVICE OBSERVATION
AL AMPLITUDE: 2.8 mv
AL IMPEDENCE PM: 415 Ohm
BAMS-0001: 150 {beats}/min
BATTERY VOLTAGE: 2.79 V
VENTRICULAR PACING PM: 1

## 2012-09-24 MED ORDER — CARVEDILOL 12.5 MG PO TABS: ORAL_TABLET | ORAL | Status: DC

## 2012-09-24 NOTE — Progress Notes (Signed)
kf Patient Care Team: Abner Greenspan, MD as PCP - General Blanchie Serve, MD as Attending Physician (Internal Medicine)   HPI  Barbara Garner is a 78 y.o. female  Seen following pacemaker implantation June 2013 for complete heart block then became manifest in the setting of bypass surgery and aortic valve replacement for aortic stenosis.  She is doing pretty well without syncope there is some discomfort  Her concerns relating to memory issues following her surgery  Past Medical History  Diagnosis Date  . Iron deficiency anemia, unspecified   . Chronic diastolic CHF (congestive heart failure)   . CAD  S/P CABG x 3 08/2011     LIMA to diagonal branch, SVG to OM1, SVG to PDA, EVH via bilateral thighs  . Type II or unspecified type diabetes mellitus without mention of complication, not stated as uncontrolled   . Contact dermatitis and other eczema, due to unspecified cause   . Esophageal reflux   . Headache(784.0)   . Pacemaker MDT dual chamber     DOI 08/2011  . Pure hypercholesterolemia   . hypertension   . Hypothyroidism   . Atrioventricular block, complete -intermittent   . Memory loss   . Idiopathic peripheral neuropathy   . LBBB (left bundle branch block)   . Carotid stenosis     Carotid dopplers 0000000 with [id number] LICA stenosis  . Severe aortic stenosis -S/P AVR      19 mm [facility] Ease pericardial tissue valve  . Extrinsic asthma, unspecified     hasn't used inhaler in past year  . CKD (chronic kidney disease) stage 4, GFR 15-29 ml/min     Cr 1.6 in 6/11, sees Dr. Arty Baumgartner  . Impaired vision     pt. reports that she identifies her meds by looking at the pillls, she is not able to read labels on bottles    Past Surgical History  Procedure Laterality Date  . Ptca  [phone]    5 blockages  . Cardiac catheterization    . Esophageal dilation  1997  . Colonoscopy  04/1999    1 polyp  . Esophagogastroduodenoscopy  04/1999    HH; "watermelon stomach" (severe gastritis)   . Dexa  10/2003 ,09/2005    osteoporosis,  Osteopenia  . Cataract extraction, bilateral  2003  . Esophagogastroduodenoscopy  04/2002    Gastritis  . Cardiovascular stress test  10/2002    normal (per patient)  . Abdominal hysterectomy  1977    partial; fibroids  . Bladder tack  1977  . Colonoscopy  06/2004    Neg. Int hem  . Carotid dopplers  2006  . Refractive surgery  2006  . Opthy  11/1998;12/01;11/02  . Aortic valve replacement  09/06/2011    Procedure: AORTIC VALVE REPLACEMENT (AVR);  Surgeon: Rexene Alberts, MD;  Location: [place];  Service: Open Heart Surgery;  Laterality: N/A;  . Coronary artery bypass graft  09/06/2011    Procedure: CORONARY ARTERY BYPASS GRAFTING (CABG);  Surgeon: Rexene Alberts, MD;  Location: [place];  Service: Open Heart Surgery;  Laterality: N/A;    Current Outpatient Prescriptions  Medication Sig Dispense Refill  . albuterol (PROVENTIL HFA;VENTOLIN HFA) 108 (90 BASE) MCG/ACT inhaler Inhale 2 puffs into the lungs every 4 (four) hours as needed. For shortness of breath.      Marland Kitchen amLODipine (NORVASC) 10 MG tablet Take 10 mg by mouth daily.       Marland Kitchen atorvastatin (LIPITOR) 40 MG tablet TAKE  1 TABLET BY MOUTH EVERY DAY  90 tablet  1  . carvedilol (COREG) 12.5 MG tablet       . carvedilol (COREG) 12.5 MG tablet TAKE 1 AND 1/2 TABLETS BY MOUTH TWICE A DAY  90 tablet  3  . ergocalciferol (VITAMIN D2) 50000 UNITS capsule Take 50,000 Units by mouth once a week.      . furosemide (LASIX) 20 MG tablet       . glipiZIDE (GLUCOTROL XL) 10 MG 24 hr tablet Take 1 tablet (10 mg total) by mouth 2 (two) times daily.  60 tablet  3  . lisinopril (PRINIVIL,ZESTRIL) 5 MG tablet Take 1 tablet (5 mg total) by mouth daily.  90 tablet  3  . omeprazole (PRILOSEC) 20 MG capsule TAKE 1 CAPSULE BY MOUTH DAILY  90 capsule  1  . polyethylene glycol (MIRALAX / GLYCOLAX) packet Take 17 g by mouth 2 (two) times daily as needed (Constipation).  60 each  0  . SYNTHROID 50 MCG tablet TAKE 1 TABLET  EVERY DAY  90 tablet  1   No current facility-administered medications for this visit.    Allergies  Allergen Reactions  . Metformin     REACTION: intolerant  . Promethazine Hcl     REACTION: u/k    Review of Systems negative except from HPI and PMH  Physical Exam BP 121/49  Pulse 60  Ht 5\' 1"  (1.549 m)  Wt 112 lb (50.803 kg)  BMI 21.17 kg/m2 Well developed and well nourished in no acute distress HENT normal E scleral and icterus clear Neck Supple JVP flat; carotids brisk and full Clear to ausculation  *Regular rate and rhythm, 2/6 murmur at the right upper sternal border Device pocket well healed; without hematoma or erythema Soft with active bowel sounds No clubbing cyanosis none Edema Alert and oriented, grossly normal motor and sensory function Skin Warm and Dry  ECG demonstrates atrial pacing with intrinsic conduction. Intervals 22/14/47 Nonspecific IVCD  Assessment and  Plan

## 2012-09-24 NOTE — Assessment & Plan Note (Signed)
euvolemic and well compensated class II symptoms

## 2012-09-24 NOTE — Patient Instructions (Addendum)
Your physician wants you to follow-up in:  4 months with Dr. Aundra Dubin. You will receive a reminder letter in the mail two months in advance. If you don't receive a letter, please call our office to schedule the follow-up appointment.  Your physician wants you to follow-up in: 6 months with the device clinic. You will receive a reminder letter in the mail two months in advance. If you don't receive a letter, please call our office to schedule the follow-up appointment.  Your physician wants you to follow-up in: 12 months with Dr. Caryl Comes.  You will receive a reminder letter in the mail two months in advance. If you don't receive a letter, please call our office to schedule the follow-up appointment.

## 2012-09-24 NOTE — Assessment & Plan Note (Signed)
45% atrially paced; reasonable heart rate excursion

## 2012-09-24 NOTE — Assessment & Plan Note (Signed)
No intercurrent atrial fibrillation detected

## 2012-09-24 NOTE — Assessment & Plan Note (Signed)
Is a.dfn The patient's device was interrogated.  The information was reviewed. No changes were made in the programming.

## 2012-09-24 NOTE — Assessment & Plan Note (Signed)
ventricularly paced about 1.2% of the day.

## 2012-10-12 ENCOUNTER — Other Ambulatory Visit: Payer: Self-pay | Admitting: Family Medicine

## 2012-11-07 ENCOUNTER — Other Ambulatory Visit: Payer: Self-pay | Admitting: Cardiology

## 2012-11-24 ENCOUNTER — Telehealth (INDEPENDENT_AMBULATORY_CARE_PROVIDER_SITE_OTHER): Payer: Medicare Other | Admitting: Family Medicine

## 2012-11-24 DIAGNOSIS — I10 Essential (primary) hypertension: Secondary | ICD-10-CM

## 2012-11-24 DIAGNOSIS — E119 Type 2 diabetes mellitus without complications: Secondary | ICD-10-CM

## 2012-11-24 DIAGNOSIS — E559 Vitamin D deficiency, unspecified: Secondary | ICD-10-CM

## 2012-11-24 NOTE — Telephone Encounter (Signed)
Message copied by Abner Greenspan on Sun Nov 24, 2012  1:56 PM ------      Message from: Ellamae Sia      Created: Thu Nov 21, 2012 10:53 AM      Regarding: Lab orders for Monday, 9.8.14       Labs for a f/u ------

## 2012-11-25 ENCOUNTER — Other Ambulatory Visit (INDEPENDENT_AMBULATORY_CARE_PROVIDER_SITE_OTHER): Payer: Medicare Other

## 2012-11-25 DIAGNOSIS — E119 Type 2 diabetes mellitus without complications: Secondary | ICD-10-CM

## 2012-11-25 DIAGNOSIS — E559 Vitamin D deficiency, unspecified: Secondary | ICD-10-CM

## 2012-11-25 LAB — BASIC METABOLIC PANEL
BUN: 33 mg/dL — ABNORMAL HIGH (ref 6–23)
Chloride: 113 mEq/L — ABNORMAL HIGH (ref 96–112)
GFR: 33.02 mL/min — ABNORMAL LOW (ref >60.00)
Glucose, Bld: 70 mg/dL (ref 70–99)
Potassium: 4.9 mEq/L (ref 3.5–5.1)
Sodium: 143 mEq/L (ref 135–145)

## 2012-11-26 ENCOUNTER — Ambulatory Visit (INDEPENDENT_AMBULATORY_CARE_PROVIDER_SITE_OTHER): Payer: Medicare Other | Admitting: Family Medicine

## 2012-11-26 ENCOUNTER — Encounter: Payer: Self-pay | Admitting: Family Medicine

## 2012-11-26 VITALS — BP 124/58 | HR 60 | Temp 98.3°F | Ht 60.0 in | Wt 115.5 lb

## 2012-11-26 DIAGNOSIS — Z23 Encounter for immunization: Secondary | ICD-10-CM

## 2012-11-26 DIAGNOSIS — N184 Chronic kidney disease, stage 4 (severe): Secondary | ICD-10-CM

## 2012-11-26 DIAGNOSIS — I10 Essential (primary) hypertension: Secondary | ICD-10-CM

## 2012-11-26 DIAGNOSIS — E119 Type 2 diabetes mellitus without complications: Secondary | ICD-10-CM

## 2012-11-26 NOTE — Addendum Note (Signed)
Addended by: Ellamae Sia on: 11/26/2012 08:56 AM   Modules accepted: Orders

## 2012-11-26 NOTE — Progress Notes (Signed)
Subjective:    Patient ID: Barbara Garner, female    DOB: 1934-08-11, 78 y.o.   MRN: [id number]  HPI Here for f/u of chronic health problems  bp is stable today  No cp or palpitations or headaches or edema  No side effects to medicines  BP Readings from Last 3 Encounters:  11/26/12 124/58  09/24/12 121/49  08/23/12 132/80      Wt is up 3lb with bmi of 22 She is not loosing weight   Renal insuff   Chemistry      Component Value Date/Time   NA 143 11/25/2012 0932   K 4.9 11/25/2012 0932   CL 113* 11/25/2012 0932   CO2 23 11/25/2012 0932   BUN 33* 11/25/2012 0932   CREATININE 1.6* 11/25/2012 0932      Component Value Date/Time   CALCIUM 9.1 11/25/2012 0932   ALKPHOS 97 08/23/2012 1204   AST 16 08/23/2012 1204   ALT 12 08/23/2012 1204   BILITOT 0.6 08/23/2012 1204     sees renal  Will see Dr Burman Foster soon  She does not drink enough water   She is in and out of doctors offices a lot    Diabetes Home sugar results - she does not check her blood sugars  DM diet -- she eats fairly well / and she avoids sugar  Exercise -not much , she does walk in the house quite a bit  Symptoms-none  A1C last  Lab Results  Component Value Date   HGBA1C 7.1* 08/23/2012    No problems with medications  Renal protection on ace  Last eye exam - Dr Herbert Deaner did ref her to a specialist and now she is fairly stable -- retina  Did not mention DM eye dz  Dr Zigmund Daniel - about a year and 1/2 ago   Flu shot-will get that today while she is here   She had some dizziness after labs yesterday- poss due to not eating breakfast     Patient Active Problem List   Diagnosis Date Noted  . Sinus bradycardia 09/24/2012  . Vitamin D deficiency disease 08/23/2012  . Lichen sclerosus et atrophicus of the vulva 04/05/2012  . Acute back pain 03/22/2012  . Chronic diastolic CHF (congestive heart failure) 11/15/2011  . Pacemaker-Medtronic 09/13/2011  . Atrial fibrillation 09/12/2011  . Complete heart block-intermittent  09/09/2011  . S/P aortic valve replacement 09/06/2011  . S/P CABG x 3 09/06/2011  . IVCD (intraventricular conduction defect)   . CKD (chronic kidney disease) stage 4, GFR 15-29 ml/min   . Carotid stenosis 05/30/2011  . Squamous cell carcinoma of skin 07/21/2010  . INTERTRIGO, CANDIDAL 03/24/2010  . MEMORY LOSS 08/19/2009  . HYPERCHOLESTEROLEMIA, PURE 09/20/2006  . HYPOTHYROIDISM 06/27/2006  . DIABETES MELLITUS, TYPE II 06/27/2006  . ANEMIA-IRON DEFICIENCY 06/27/2006  . PERIPHERAL NEUROPATHY 06/27/2006  . HYPERTENSION 06/27/2006  . MYOCARDIAL INFARCTION, HX OF 06/27/2006  . CORONARY ARTERY DISEASE 06/27/2006  . ASTHMA 06/27/2006  . GERD 06/27/2006  . PNEUMONIA, HX OF 06/27/2006  . ECZEMA 06/27/2006   Past Medical History  Diagnosis Date  . Iron deficiency anemia, unspecified   . Chronic diastolic CHF (congestive heart failure)   . CAD  S/P CABG x 3 08/2011     LIMA to diagonal branch, SVG to OM1, SVG to PDA, EVH via bilateral thighs  . Type II or unspecified type diabetes mellitus without mention of complication, not stated as uncontrolled   . Contact dermatitis and other eczema, due to  unspecified cause   . Esophageal reflux   . Headache(784.0)   . Pacemaker MDT dual chamber     DOI 08/2011  . Pure hypercholesterolemia   . hypertension   . Hypothyroidism   . Atrioventricular block, complete -intermittent   . Memory loss   . Idiopathic peripheral neuropathy   . LBBB (left bundle branch block)   . Carotid stenosis     Carotid dopplers 0000000 with [id number] LICA stenosis  . Severe aortic stenosis -S/P AVR      19 mm [facility] Ease pericardial tissue valve  . Extrinsic asthma, unspecified     hasn't used inhaler in past year  . CKD (chronic kidney disease) stage 4, GFR 15-29 ml/min     Cr 1.6 in 6/11, sees Dr. Arty Baumgartner  . Impaired vision     pt. reports that she identifies her meds by looking at the pillls, she is not able to read labels on bottles   Past Surgical History   Procedure Laterality Date  . Ptca  [phone]    5 blockages  . Cardiac catheterization    . Esophageal dilation  1997  . Colonoscopy  04/1999    1 polyp  . Esophagogastroduodenoscopy  04/1999    HH; "watermelon stomach" (severe gastritis)  . Dexa  10/2003 ,09/2005    osteoporosis,  Osteopenia  . Cataract extraction, bilateral  2003  . Esophagogastroduodenoscopy  04/2002    Gastritis  . Cardiovascular stress test  10/2002    normal (per patient)  . Abdominal hysterectomy  1977    partial; fibroids  . Bladder tack  1977  . Colonoscopy  06/2004    Neg. Int hem  . Carotid dopplers  2006  . Refractive surgery  2006  . Opthy  11/1998;12/01;11/02  . Aortic valve replacement  09/06/2011    Procedure: AORTIC VALVE REPLACEMENT (AVR);  Surgeon: Rexene Alberts, MD;  Location: [place];  Service: Open Heart Surgery;  Laterality: N/A;  . Coronary artery bypass graft  09/06/2011    Procedure: CORONARY ARTERY BYPASS GRAFTING (CABG);  Surgeon: Rexene Alberts, MD;  Location: [place];  Service: Open Heart Surgery;  Laterality: N/A;   History  Substance Use Topics  . Smoking status: Never Smoker   . Smokeless tobacco: Not on file  . Alcohol Use: No   Family History  Problem Relation Age of Onset  . Stroke Father     died in his 29's  . Stroke Mother     died in her 65's   Allergies  Allergen Reactions  . Metformin     REACTION: intolerant  . Promethazine Hcl     REACTION: u/k   Current Outpatient Prescriptions on File Prior to Visit  Medication Sig Dispense Refill  . albuterol-ipratropium (COMBIVENT) 18-103 MCG/ACT inhaler Inhale 2 puffs into the lungs every 4 (four) hours as needed for wheezing.      Marland Kitchen amLODipine (NORVASC) 10 MG tablet Take 10 mg by mouth daily.       Marland Kitchen atorvastatin (LIPITOR) 40 MG tablet TAKE 1 TABLET BY MOUTH EVERY DAY  90 tablet  1  . carvedilol (COREG) 12.5 MG tablet TAKE 1 AND 1/2 TABLETS BY MOUTH TWICE A DAY  270 tablet  0  . ergocalciferol (VITAMIN D2) 50000 UNITS  capsule Take 50,000 Units by mouth once a week.      . furosemide (LASIX) 20 MG tablet TAKE 1 TABLET BY MOUTH EVERY OTHER DAY  90 tablet  0  .  glipiZIDE (GLUCOTROL XL) 10 MG 24 hr tablet TAKE 1 TABLET TWICE A DAY  180 tablet  0  . lisinopril (PRINIVIL,ZESTRIL) 5 MG tablet TAKE 1 TABLET EVERY DAY  90 tablet  3  . omeprazole (PRILOSEC) 20 MG capsule TAKE 1 CAPSULE BY MOUTH DAILY  90 capsule  1  . polyethylene glycol (MIRALAX / GLYCOLAX) packet Take 17 g by mouth 2 (two) times daily as needed (Constipation).  60 each  0  . SYNTHROID 50 MCG tablet TAKE 1 TABLET EVERY DAY  90 tablet  1   No current facility-administered medications on file prior to visit.    Review of Systems Review of Systems  Constitutional: Negative for fever, appetite change, fatigue and unexpected weight change.  Eyes: Negative for pain and visual disturbance.  Respiratory: Negative for cough and shortness of breath.   Cardiovascular: Negative for cp or palpitations    Gastrointestinal: Negative for nausea, diarrhea and constipation.  Genitourinary: Negative for urgency and frequency. no excessive thirst  Skin: Negative for pallor or rash   Neurological: Negative for weakness, light-headedness, numbness and headaches.  Hematological: Negative for adenopathy. Does not bruise/bleed easily.  Psychiatric/Behavioral: Negative for dysphoric mood. The patient is not nervous/anxious.         Objective:   Physical Exam  Constitutional: She appears well-developed and well-nourished. No distress.  Well app elderly female  HENT:  Head: Normocephalic and atraumatic.  Mouth/Throat: Oropharynx is clear and moist.  Eyes: Conjunctivae and EOM are normal. Pupils are equal, round, and reactive to light. No scleral icterus.  Neck: Normal range of motion. Neck supple. No JVD present. No thyromegaly present.  Cardiovascular: Normal rate and regular rhythm.  Exam reveals no gallop.   Murmur heard. Pulmonary/Chest: Effort normal and  breath sounds normal. No respiratory distress. She has no wheezes. She has no rales.  Abdominal: Soft. Bowel sounds are normal. She exhibits no distension, no abdominal bruit and no mass. There is no tenderness.  Musculoskeletal: She exhibits no edema.  Lymphadenopathy:    She has no cervical adenopathy.  Neurological: She is alert. She has normal reflexes. No cranial nerve deficit. She exhibits normal muscle tone. Coordination normal.  Skin: Skin is warm and dry. No rash noted. No erythema. No pallor.  Psychiatric: She has a normal mood and affect.          Assessment & Plan:

## 2012-11-26 NOTE — Assessment & Plan Note (Signed)
Pending A1c from recent labs No symptoms of hypoglycemia  Pt does not check sugars promted her to schedule annual eye exam  Flu vaccine today

## 2012-11-26 NOTE — Assessment & Plan Note (Signed)
bp in fair control at this time  No changes needed  Disc lifstyle change with low sodium diet and exercise  Labs removed  Disc imp of this for renal fxn

## 2012-11-26 NOTE — Assessment & Plan Note (Signed)
For f/u with renal soon  No symptoms or urinary changes  Lab Results  Component Value Date   CREATININE 1.6* 11/25/2012   disc imp of water intake -that needs to improve

## 2012-11-26 NOTE — Patient Instructions (Addendum)
Flu shot today  Keep watching your diet for sugars  Follow up with your kidney doctor when it is time I think you are overdue for your annual exam with Dr Herbert Deaner- for eyes - I will let you schedule that  Follow up in 6 months for annual exam with labs prior (you can have a light breakfast)

## 2012-11-27 ENCOUNTER — Ambulatory Visit: Payer: Medicare Other | Admitting: Family Medicine

## 2013-01-31 ENCOUNTER — Other Ambulatory Visit: Payer: Self-pay | Admitting: Family Medicine

## 2013-02-07 ENCOUNTER — Ambulatory Visit (INDEPENDENT_AMBULATORY_CARE_PROVIDER_SITE_OTHER): Payer: Medicare Other | Admitting: Family Medicine

## 2013-02-07 ENCOUNTER — Encounter: Payer: Self-pay | Admitting: Family Medicine

## 2013-02-07 VITALS — BP 122/70 | HR 60 | Temp 97.4°F | Ht 60.0 in | Wt 111.5 lb

## 2013-02-07 DIAGNOSIS — N390 Urinary tract infection, site not specified: Secondary | ICD-10-CM | POA: Insufficient documentation

## 2013-02-07 DIAGNOSIS — R32 Unspecified urinary incontinence: Secondary | ICD-10-CM | POA: Insufficient documentation

## 2013-02-07 DIAGNOSIS — R350 Frequency of micturition: Secondary | ICD-10-CM

## 2013-02-07 LAB — POCT URINALYSIS DIPSTICK
Glucose, UA: NEGATIVE
Ketones, UA: NEGATIVE
Spec Grav, UA: 1.02
Urobilinogen, UA: 0.2

## 2013-02-07 MED ORDER — CEPHALEXIN 250 MG PO CAPS: 250.0000 mg | ORAL_CAPSULE | Freq: Two times a day (BID) | ORAL | Status: DC

## 2013-02-07 NOTE — Patient Instructions (Signed)
Try to leave a urine sample on the way out If you cannot, please bring back a sample  We will check for infection  Then make a plan based on result

## 2013-02-07 NOTE — Progress Notes (Signed)
Subjective:    Patient ID: Barbara Garner, female    DOB: [birth year], 78 y.o.   MRN: [id number]  HPI Here for urinary incontinence   "bladder leakage " All the time Not stress   Has dec her water to help it  Has CKD   Going on for months  Some frequency and urgency No dysuria or blood or odor  She is unsure if she can give urine sample today   Does not want an exam due to wearing a heavy girdle  Patient Active Problem List   Diagnosis Date Noted  . Urine incontinence 02/07/2013  . Urinary frequency 02/07/2013  . UTI (urinary tract infection) 02/07/2013  . Sinus bradycardia 09/24/2012  . Vitamin D deficiency disease 08/23/2012  . Lichen sclerosus et atrophicus of the vulva 04/05/2012  . Acute back pain 03/22/2012  . Chronic diastolic CHF (congestive heart failure) 11/15/2011  . Pacemaker-Medtronic 09/13/2011  . Atrial fibrillation 09/12/2011  . Complete heart block-intermittent 09/09/2011  . S/P aortic valve replacement 09/06/2011  . S/P CABG x 3 09/06/2011  . IVCD (intraventricular conduction defect)   . CKD (chronic kidney disease) stage 4, GFR 15-29 ml/min   . Carotid stenosis 05/30/2011  . Squamous cell carcinoma of skin 07/21/2010  . INTERTRIGO, CANDIDAL 03/24/2010  . MEMORY LOSS 08/19/2009  . HYPERCHOLESTEROLEMIA, PURE 09/20/2006  . HYPOTHYROIDISM 06/27/2006  . DIABETES MELLITUS, TYPE II 06/27/2006  . ANEMIA-IRON DEFICIENCY 06/27/2006  . PERIPHERAL NEUROPATHY 06/27/2006  . HYPERTENSION 06/27/2006  . MYOCARDIAL INFARCTION, HX OF 06/27/2006  . CORONARY ARTERY DISEASE 06/27/2006  . ASTHMA 06/27/2006  . GERD 06/27/2006  . PNEUMONIA, HX OF 06/27/2006  . ECZEMA 06/27/2006   Past Medical History  Diagnosis Date  . Iron deficiency anemia, unspecified   . Chronic diastolic CHF (congestive heart failure)   . CAD  S/P CABG x 3 08/2011     LIMA to diagonal branch, SVG to OM1, SVG to PDA, EVH via bilateral thighs  . Type II or unspecified type diabetes mellitus  without mention of complication, not stated as uncontrolled   . Contact dermatitis and other eczema, due to unspecified cause   . Esophageal reflux   . Headache(784.0)   . Pacemaker MDT dual chamber     DOI 08/2011  . Pure hypercholesterolemia   . hypertension   . Hypothyroidism   . Atrioventricular block, complete -intermittent   . Memory loss   . Idiopathic peripheral neuropathy   . LBBB (left bundle branch block)   . Carotid stenosis     Carotid dopplers 0000000 with [id number] LICA stenosis  . Severe aortic stenosis -S/P AVR      19 mm [facility] Ease pericardial tissue valve  . Extrinsic asthma, unspecified     hasn't used inhaler in past year  . CKD (chronic kidney disease) stage 4, GFR 15-29 ml/min     Cr 1.6 in 6/11, sees Dr. Arty Baumgartner  . Impaired vision     pt. reports that she identifies her meds by looking at the pillls, she is not able to read labels on bottles   Past Surgical History  Procedure Laterality Date  . Ptca  [phone]    5 blockages  . Cardiac catheterization    . Esophageal dilation  1997  . Colonoscopy  04/1999    1 polyp  . Esophagogastroduodenoscopy  04/1999    HH; "watermelon stomach" (severe gastritis)  . Dexa  10/2003 ,09/2005    osteoporosis,  Osteopenia  . Cataract extraction, bilateral  2003  . Esophagogastroduodenoscopy  04/2002    Gastritis  . Cardiovascular stress test  10/2002    normal (per patient)  . Abdominal hysterectomy  1977    partial; fibroids  . Bladder tack  1977  . Colonoscopy  06/2004    Neg. Int hem  . Carotid dopplers  2006  . Refractive surgery  2006  . Opthy  11/1998;12/01;11/02  . Aortic valve replacement  09/06/2011    Procedure: AORTIC VALVE REPLACEMENT (AVR);  Surgeon: Rexene Alberts, MD;  Location: [place];  Service: Open Heart Surgery;  Laterality: N/A;  . Coronary artery bypass graft  09/06/2011    Procedure: CORONARY ARTERY BYPASS GRAFTING (CABG);  Surgeon: Rexene Alberts, MD;  Location: [place];  Service: Open Heart  Surgery;  Laterality: N/A;   History  Substance Use Topics  . Smoking status: Never Smoker   . Smokeless tobacco: Not on file  . Alcohol Use: No   Family History  Problem Relation Age of Onset  . Stroke Father     died in his 15's  . Stroke Mother     died in her 69's   Allergies  Allergen Reactions  . Metformin     REACTION: intolerant  . Promethazine Hcl     REACTION: u/k   Current Outpatient Prescriptions on File Prior to Visit  Medication Sig Dispense Refill  . amLODipine (NORVASC) 10 MG tablet Take 10 mg by mouth daily.       Marland Kitchen atorvastatin (LIPITOR) 40 MG tablet TAKE 1 TABLET BY MOUTH EVERY DAY  90 tablet  1  . carvedilol (COREG) 12.5 MG tablet TAKE 1 AND 1/2 TABLETS BY MOUTH TWICE A DAY  270 tablet  0  . ergocalciferol (VITAMIN D2) 50000 UNITS capsule Take 50,000 Units by mouth once a week.      . furosemide (LASIX) 20 MG tablet TAKE 1 TABLET BY MOUTH EVERY OTHER DAY  90 tablet  0  . glipiZIDE (GLUCOTROL XL) 10 MG 24 hr tablet TAKE 1 TABLET TWICE A DAY  180 tablet  1  . lisinopril (PRINIVIL,ZESTRIL) 5 MG tablet TAKE 1 TABLET EVERY DAY  90 tablet  3  . omeprazole (PRILOSEC) 20 MG capsule TAKE 1 CAPSULE BY MOUTH DAILY  90 capsule  1  . SYNTHROID 50 MCG tablet TAKE 1 TABLET EVERY DAY  90 tablet  1  . polyethylene glycol (MIRALAX / GLYCOLAX) packet Take 17 g by mouth 2 (two) times daily as needed (Constipation).  60 each  0   No current facility-administered medications on file prior to visit.    Review of Systems Review of Systems  Constitutional: Negative for fever, appetite change, fatigue and unexpected weight change.  Eyes: Negative for pain and visual disturbance.  Respiratory: Negative for cough and shortness of breath.   Cardiovascular: Negative for cp or palpitations    Gastrointestinal: Negative for nausea, diarrhea and constipation.  Genitourinary: pos for urgency and frequency. neg for dysuria or flank pain  Skin: Negative for pallor or rash    Neurological: Negative for weakness, light-headedness, numbness and headaches.  Hematological: Negative for adenopathy. Does not bruise/bleed easily.  Psychiatric/Behavioral: Negative for dysphoric mood. The patient is somewhat  nervous/anxious.         Objective:   Physical Exam  Constitutional: She appears well-developed and well-nourished. No distress.  Frail app elderly female  HENT:  Head: Normocephalic and atraumatic.  Mouth/Throat: Oropharynx is clear and moist.  Eyes: Conjunctivae and EOM are normal.  Pupils are equal, round, and reactive to light. No scleral icterus.  Neck: Normal range of motion. Neck supple. No thyromegaly present.  Cardiovascular: Normal rate and regular rhythm.   Pulmonary/Chest: Effort normal and breath sounds normal.  Abdominal: Soft. Bowel sounds are normal. She exhibits no distension and no mass. There is no tenderness. There is no rebound and no guarding.  No suprapubic tenderness or fullness   No cva tenderness   Musculoskeletal: She exhibits no edema.  Lymphadenopathy:    She has no cervical adenopathy.  Neurological: She is alert.  Skin: Skin is warm and dry. No rash noted.  Psychiatric: She has a normal mood and affect.          Assessment & Plan:

## 2013-02-07 NOTE — Progress Notes (Signed)
Pre-visit discussion using our clinic review tool. No additional management support is needed unless otherwise documented below in the visit note.  

## 2013-02-09 LAB — URINE CULTURE

## 2013-02-09 NOTE — Assessment & Plan Note (Signed)
Pend urine cx- suspect infx (and also overactive bladder)

## 2013-02-09 NOTE — Assessment & Plan Note (Signed)
Pos ua today - this may add to frequency and incontinence Cover with keflex  Urine sample too small to cx- she will bring another sample back before starting abx to cx Given CKD- also enc strongly to increase water intake

## 2013-02-09 NOTE — Assessment & Plan Note (Signed)
Looks like uti -will cover with keflex and also send for culture  Disc stress/ urge /overflow incontinence as well as overactive bladder-per hx she has some features of each  Pend cx and resp to abx before further w/u

## 2013-02-19 ENCOUNTER — Other Ambulatory Visit: Payer: Medicare Other

## 2013-02-26 ENCOUNTER — Encounter: Payer: Medicare Other | Admitting: Family Medicine

## 2013-04-01 ENCOUNTER — Encounter: Payer: Self-pay | Admitting: *Deleted

## 2013-05-12 ENCOUNTER — Ambulatory Visit (INDEPENDENT_AMBULATORY_CARE_PROVIDER_SITE_OTHER): Payer: No Typology Code available for payment source | Admitting: *Deleted

## 2013-05-12 DIAGNOSIS — I4891 Unspecified atrial fibrillation: Secondary | ICD-10-CM

## 2013-05-12 DIAGNOSIS — I442 Atrioventricular block, complete: Secondary | ICD-10-CM

## 2013-05-12 LAB — MDC_IDC_ENUM_SESS_TYPE_INCLINIC
Battery Impedance: 135 Ohm
Battery Remaining Longevity: 138 mo
Brady Statistic AP VP Percent: 0 %
Brady Statistic AS VP Percent: 0 %
Brady Statistic AS VS Percent: 26 %
Date Time Interrogation Session: 20150223094158
Lead Channel Impedance Value: 426 Ohm
Lead Channel Impedance Value: 610 Ohm
Lead Channel Pacing Threshold Amplitude: 0.75 V
Lead Channel Pacing Threshold Pulse Width: 0.4 ms
Lead Channel Sensing Intrinsic Amplitude: 15.67 mV
Lead Channel Sensing Intrinsic Amplitude: 2.8 mV
Lead Channel Setting Pacing Amplitude: 2 V
Lead Channel Setting Pacing Amplitude: 2.5 V
Lead Channel Setting Pacing Pulse Width: 0.4 ms
MDC IDC MSMT BATTERY VOLTAGE: 2.79 V
MDC IDC MSMT LEADCHNL RV PACING THRESHOLD AMPLITUDE: 0.75 V
MDC IDC MSMT LEADCHNL RV PACING THRESHOLD PULSEWIDTH: 0.4 ms
MDC IDC SET LEADCHNL RV SENSING SENSITIVITY: 5.6 mV
MDC IDC STAT BRADY AP VS PERCENT: 74 %

## 2013-05-12 NOTE — Progress Notes (Signed)
Pacemaker check in clinic. Normal device function. Thresholds, sensing, impedances consistent with previous measurements. Device programmed to maximize longevity. 7 mode switches all <1 minute.  No high ventricular rates noted. Device programmed at appropriate safety margins. Histogram distribution blunted, rate response programmed on today.   Device programmed to optimize intrinsic conduction. Estimated longevity 11.5 years.  Patient education completed.  ROV 6 months with Dr. Caryl Comes.

## 2013-05-27 ENCOUNTER — Encounter: Payer: Medicare Other | Admitting: Family Medicine

## 2013-06-24 ENCOUNTER — Encounter: Payer: Self-pay | Admitting: Internal Medicine

## 2013-06-26 ENCOUNTER — Other Ambulatory Visit: Payer: Self-pay

## 2013-06-27 ENCOUNTER — Telehealth: Payer: Self-pay | Admitting: Cardiology

## 2013-06-27 NOTE — Telephone Encounter (Signed)
New Message  Pt  call in to inform out office that she was experiencing SOB// not at the current momment. She is requesting a call back to determine if she will need to go to urgent care.

## 2013-06-27 NOTE — Telephone Encounter (Signed)
Spoke with patient. She states starting yesterday she developed cough and pain across the left side of her chest when she sits up. She is OK as long as she is laying down but cough and pain across chest start when she sits up. Pt states she is not having any other symptoms.  I recommended pt go to Urgent Care or ED for further evaluation.

## 2013-07-03 ENCOUNTER — Other Ambulatory Visit: Payer: Self-pay | Admitting: Family Medicine

## 2013-07-16 ENCOUNTER — Ambulatory Visit (INDEPENDENT_AMBULATORY_CARE_PROVIDER_SITE_OTHER): Payer: No Typology Code available for payment source | Admitting: Family Medicine

## 2013-07-16 ENCOUNTER — Encounter: Payer: Self-pay | Admitting: Family Medicine

## 2013-07-16 VITALS — BP 96/54 | HR 82 | Temp 97.4°F | Ht 60.0 in | Wt 112.5 lb

## 2013-07-16 DIAGNOSIS — N3941 Urge incontinence: Secondary | ICD-10-CM | POA: Insufficient documentation

## 2013-07-16 DIAGNOSIS — N390 Urinary tract infection, site not specified: Secondary | ICD-10-CM

## 2013-07-16 DIAGNOSIS — R32 Unspecified urinary incontinence: Secondary | ICD-10-CM

## 2013-07-16 LAB — POCT URINALYSIS DIPSTICK
GLUCOSE UA: NEGATIVE
Nitrite, UA: NEGATIVE
SPEC GRAV UA: 1.02
UROBILINOGEN UA: 1
pH, UA: 6

## 2013-07-16 MED ORDER — TOLTERODINE TARTRATE ER 2 MG PO CP24: 2.0000 mg | ORAL_CAPSULE | Freq: Every day | ORAL | Status: DC

## 2013-07-16 MED ORDER — CEPHALEXIN 250 MG PO CAPS: 250.0000 mg | ORAL_CAPSULE | Freq: Two times a day (BID) | ORAL | Status: DC

## 2013-07-16 NOTE — Progress Notes (Signed)
   Subjective:    Patient ID: Barbara Garner, female    DOB: Nov 27, 1934, 79 y.o.   MRN: [id number]  HPI Here with urine incontinence  Some discomfort to urinate  Urine incontinence and - the pad is irritating  She tends goes somewhere she will avoid drinking fluids  The incontinence has gone on 1-2 years  No worse or better lately No odor to urine  No blood in urine  No fever and no nauseated   Not enough urine for culture today    Results for orders placed in visit on 07/16/13  POCT URINALYSIS DIPSTICK      Result Value Ref Range   Color, UA yellow     Clarity, UA cloudy     Glucose, UA neg     Bilirubin, UA small     Ketones, UA trace     Spec Grav, UA 1.020     Blood, UA moderate     pH, UA 6.0     Protein, UA trace     Urobilinogen, UA 1.0     Nitrite, UA neg     Leukocytes, UA large (3+)        Review of Systems Review of Systems  Constitutional: Negative for fever, appetite change, fatigue and unexpected weight change.  Eyes: Negative for pain and visual disturbance.  Respiratory: Negative for cough and shortness of breath.   Cardiovascular: Negative for cp or palpitations    Gastrointestinal: Negative for nausea, diarrhea and constipation.  Genitourinary: pos for urgency and frequency/neg for flank pain/ pos for incontinence  Skin: Negative for pallor or rash   Neurological: Negative for weakness, light-headedness, numbness and headaches.  Hematological: Negative for adenopathy. Does not bruise/bleed easily.  Psychiatric/Behavioral: Negative for dysphoric mood. The patient is not nervous/anxious.         Objective:   Physical Exam  Constitutional: She appears well-developed and well-nourished. No distress.  HENT:  Head: Normocephalic and atraumatic.  Eyes: Conjunctivae and EOM are normal. Pupils are equal, round, and reactive to light. Right eye exhibits no discharge. Left eye exhibits no discharge.  Neck: Normal range of motion. Neck supple.    Cardiovascular: Normal rate and regular rhythm.   Pulmonary/Chest: Effort normal and breath sounds normal.  Abdominal: Soft. Bowel sounds are normal. She exhibits no distension and no mass. There is no tenderness. There is no rebound and no guarding.  Musculoskeletal:  No cva tenderness   Lymphadenopathy:    She has no cervical adenopathy.  Neurological: She is alert.  Skin: Skin is warm and dry. No rash noted.  Psychiatric: She has a normal mood and affect.          Assessment & Plan:

## 2013-07-16 NOTE — Progress Notes (Signed)
Pre visit review using our clinic review tool, if applicable. No additional management support is needed unless otherwise documented below in the visit note. 

## 2013-07-16 NOTE — Patient Instructions (Signed)
For urinary tract infection -take the keflex as directed (cephalexin)- and drink lots of water  For incontinence -try the detrol la 20 mg - and take one pill daily - common side effects are dry mouth and constipation -if this does not improve your symptoms or if you have intolerable side effects please let me know  Follow up with me in about 2 weeks     Urinary Incontinence Urinary incontinence is the involuntary loss of urine from your bladder. CAUSES  There are many causes of urinary incontinence. They include:  Medicines.  Infections.  Prostatic enlargement, leading to overflow of urine from your bladder.  Surgery.  Neurological diseases.  Emotional factors. SIGNS AND SYMPTOMS Urinary Incontinence can be divided into four types: 1. Urge incontinence. Urge incontinence is the involuntary loss of urine before you have the opportunity to go to the bathroom. There is a sudden urge to void but not enough time to reach a bathroom. 2. Stress incontinence. Stress incontinence is the sudden loss of urine with any activity that forces urine to pass. It is commonly caused by anatomical changes to the pelvis and sphincter areas of your body. 3. Overflow incontinence. Overflow incontinence is the loss of urine from an obstructed opening to your bladder. This results in a backup of urine and a resultant buildup of pressure within the bladder. When the pressure within the bladder exceeds the closing pressure of the sphincter, the urine overflows, which causes incontinence, similar to water overflowing a dam. 4. Total incontinence. Total incontinence is the loss of urine as a result of the inability to store urine within your bladder. DIAGNOSIS  Evaluating the cause of incontinence may require:  A thorough and complete medical and obstetric history.  A complete physical exam.  Laboratory tests such as a urine culture and sensitivities. When additional tests are indicated, they can  include:  An ultrasound exam.  Kidney and bladder X-rays.  Cystoscopy. This is an exam of the bladder using a narrow scope.  Urodynamic testing to test the nerve function to the bladder and sphincter areas. TREATMENT  Treatment for urinary incontinence depends on the cause:  For urge incontinence caused by a bacterial infection, antibiotics will be prescribed. If the urge incontinence is related to medicines you take, your health care provider may have you change the medicine.  For stress incontinence, surgery to re-establish anatomical support to the bladder or sphincter, or both, will often correct the condition.  For overflow incontinence caused by an enlarged prostate, an operation to open the channel through the enlarged prostate will allow the flow of urine out of the bladder. In women with fibroids, a hysterectomy may be recommended.  For total incontinence, surgery on your urinary sphincter may help. An artificial urinary sphincter (an inflatable cuff placed around the urethra) may be required. In women who have developed a hole-like passage between their bladder and vagina (vesicovaginal fistula), surgery to close the fistula often is required. HOME CARE INSTRUCTIONS  Normal daily hygiene and the use of pads or adult diapers that are changed regularly will help prevent odors and skin damage.  Avoid caffeine. It can overstimulate your bladder.  Use the bathroom regularly. Try about every 2 3 hours to go to the bathroom, even if you do not feel the need to do so. Take time to empty your bladder completely. After urinating, wait a minute. Then try to urinate again.  For causes involving nerve dysfunction, keep a log of the medicines you take and  a journal of the times you go to the bathroom. SEEK MEDICAL CARE IF:  You experience worsening of pain instead of improvement in pain after your procedure.  Your incontinence becomes worse instead of better. SEE IMMEDIATE MEDICAL CARE  IF:  You experience fever or shaking chills.  You are unable to pass your urine.  You have redness spreading into your groin or down into your thighs. MAKE SURE YOU:   Understand these instructions.   Will watch your condition.  Will get help right away if you are not doing well or get worse. Document Released: 04/13/2004 Document Revised: 12/25/2012 Document Reviewed: 08/13/2012 [facility] Patient Information 2014 [place].

## 2013-07-17 LAB — POCT UA - MICROSCOPIC ONLY
Casts, Ur, LPF, POC: 0
Yeast, UA: 0

## 2013-07-17 NOTE — Assessment & Plan Note (Signed)
Cover with keflex  Enc fluid intake  Not enough specimen to cx F/u 2 week for re check

## 2013-07-17 NOTE — Assessment & Plan Note (Signed)
This predates her uti Also renal insuff Will try renally dosed detrol la at 2 mg  Rev side eff in detail  Most importantly- do not want pt to dehydrate herself intentionally to avoid incontinence  Stressed imp of hydration  If not eff-may be interested in urol consult  F/u 2 wk

## 2013-07-29 ENCOUNTER — Encounter: Payer: Self-pay | Admitting: Family Medicine

## 2013-07-29 ENCOUNTER — Ambulatory Visit (INDEPENDENT_AMBULATORY_CARE_PROVIDER_SITE_OTHER): Payer: Medicare Other | Admitting: Family Medicine

## 2013-07-29 VITALS — BP 106/54 | HR 68 | Temp 97.4°F | Ht 60.0 in | Wt 113.2 lb

## 2013-07-29 DIAGNOSIS — Z87440 Personal history of urinary (tract) infections: Secondary | ICD-10-CM

## 2013-07-29 DIAGNOSIS — N390 Urinary tract infection, site not specified: Secondary | ICD-10-CM

## 2013-07-29 DIAGNOSIS — R32 Unspecified urinary incontinence: Secondary | ICD-10-CM

## 2013-07-29 LAB — POCT URINALYSIS DIPSTICK

## 2013-07-29 NOTE — Progress Notes (Signed)
Pre visit review using our clinic review tool, if applicable. No additional management support is needed unless otherwise documented below in the visit note. 

## 2013-07-29 NOTE — Patient Instructions (Signed)
Your urine looks improved  I will still send it for a culture  Since the detrol I prescribed for incontinence and frequency is not affordable -please call your insurance plan and ask what medicines they cover for incontinence/ overactive bladder - give me the list and I will choose one for you if you want to try it Stop the over the counter uristat  Also - call your insurance or pharmacist to find out EXACTLY what brand of meter/strips and lancets you need and I will write a px and fax it to the pharmacy

## 2013-07-29 NOTE — Progress Notes (Signed)
Subjective:    Patient ID: Barbara Garner, female    DOB: [birth year], 79 y.o.   MRN: [id number]  HPI Last visit tx with keflex for a uti  She thinks it helped some   No burning to urinate  No blood  Is still taking uristat otc for    Given detrol for incontinence as well - it was too $$ She tried uristat instead otc and she thinks it helped    ua today- discolored  The microscopic shows a few leukocytes   Family member is worried about her memory with her age  She denies any problems   Patient Active Problem List   Diagnosis Date Noted  . Urge incontinence of urine 07/16/2013  . Urine incontinence 02/07/2013  . Urinary frequency 02/07/2013  . UTI (urinary tract infection) 02/07/2013  . Sinus bradycardia 09/24/2012  . Vitamin D deficiency disease 08/23/2012  . Lichen sclerosus et atrophicus of the vulva 04/05/2012  . Acute back pain 03/22/2012  . Chronic diastolic CHF (congestive heart failure) 11/15/2011  . Pacemaker-[employer] 09/13/2011  . Atrial fibrillation 09/12/2011  . Complete heart block-intermittent 09/09/2011  . S/P aortic valve replacement 09/06/2011  . S/P CABG x 3 09/06/2011  . IVCD (intraventricular conduction defect)   . CKD (chronic kidney disease) stage 4, GFR 15-29 ml/min   . Carotid stenosis 05/30/2011  . Squamous cell carcinoma of skin 07/21/2010  . INTERTRIGO, CANDIDAL 03/24/2010  . MEMORY LOSS 08/19/2009  . HYPERCHOLESTEROLEMIA, PURE 09/20/2006  . HYPOTHYROIDISM 06/27/2006  . DIABETES MELLITUS, TYPE II 06/27/2006  . ANEMIA-IRON DEFICIENCY 06/27/2006  . PERIPHERAL NEUROPATHY 06/27/2006  . HYPERTENSION 06/27/2006  . MYOCARDIAL INFARCTION, HX OF 06/27/2006  . CORONARY ARTERY DISEASE 06/27/2006  . ASTHMA 06/27/2006  . GERD 06/27/2006  . PNEUMONIA, HX OF 06/27/2006  . ECZEMA 06/27/2006   Past Medical History  Diagnosis Date  . Iron deficiency anemia, unspecified   . Chronic diastolic CHF (congestive heart failure)   . CAD  S/P CABG x 3  08/2011     LIMA to diagonal branch, SVG to OM1, SVG to PDA, EVH via bilateral thighs  . Type II or unspecified type diabetes mellitus without mention of complication, not stated as uncontrolled   . Contact dermatitis and other eczema, due to unspecified cause   . Esophageal reflux   . Headache(784.0)   . Pacemaker MDT dual chamber     DOI 08/2011  . Pure hypercholesterolemia   . hypertension   . Hypothyroidism   . Atrioventricular block, complete -intermittent   . Memory loss   . Idiopathic peripheral neuropathy   . LBBB (left bundle branch block)   . Carotid stenosis     Carotid dopplers 0000000 with [id number] LICA stenosis  . Severe aortic stenosis -S/P AVR      19 mm [facility] Ease pericardial tissue valve  . Extrinsic asthma, unspecified     hasn't used inhaler in past year  . CKD (chronic kidney disease) stage 4, GFR 15-29 ml/min     Cr 1.6 in 6/11, sees Dr. Arty Baumgartner  . Impaired vision     pt. reports that she identifies her meds by looking at the pillls, she is not able to read labels on bottles   Past Surgical History  Procedure Laterality Date  . Ptca  [phone]    5 blockages  . Cardiac catheterization    . Esophageal dilation  1997  . Colonoscopy  04/1999    1 polyp  . Esophagogastroduodenoscopy  04/1999    HH; "watermelon stomach" (severe gastritis)  . Dexa  10/2003 ,09/2005    osteoporosis,  Osteopenia  . Cataract extraction, bilateral  2003  . Esophagogastroduodenoscopy  04/2002    Gastritis  . Cardiovascular stress test  10/2002    normal (per patient)  . Abdominal hysterectomy  1977    partial; fibroids  . Bladder tack  1977  . Colonoscopy  06/2004    Neg. Int hem  . Carotid dopplers  2006  . Refractive surgery  2006  . Opthy  11/1998;12/01;11/02  . Aortic valve replacement  09/06/2011    Procedure: AORTIC VALVE REPLACEMENT (AVR);  Surgeon: Rexene Alberts, MD;  Location: [place];  Service: Open Heart Surgery;  Laterality: N/A;  . Coronary artery bypass graft   09/06/2011    Procedure: CORONARY ARTERY BYPASS GRAFTING (CABG);  Surgeon: Rexene Alberts, MD;  Location: [place];  Service: Open Heart Surgery;  Laterality: N/A;   History  Substance Use Topics  . Smoking status: Never Smoker   . Smokeless tobacco: Not on file  . Alcohol Use: No   Family History  Problem Relation Age of Onset  . Stroke Father     died in his 21's  . Stroke Mother     died in her 17's   Allergies  Allergen Reactions  . Metformin     REACTION: intolerant  . Promethazine Hcl     REACTION: u/k   Current Outpatient Prescriptions on File Prior to Visit  Medication Sig Dispense Refill  . amLODipine (NORVASC) 10 MG tablet Take 10 mg by mouth daily.       Marland Kitchen atorvastatin (LIPITOR) 40 MG tablet TAKE 1 TABLET BY MOUTH EVERY DAY  90 tablet  1  . carvedilol (COREG) 12.5 MG tablet TAKE 1 AND 1/2 TABLETS BY MOUTH TWICE A DAY  270 tablet  0  . Cholecalciferol (VITAMIN D-3) 1000 UNITS CAPS Take 1 capsule by mouth daily.      . furosemide (LASIX) 20 MG tablet TAKE 1 TABLET BY MOUTH EVERY OTHER DAY  90 tablet  0  . glipiZIDE (GLUCOTROL XL) 10 MG 24 hr tablet TAKE 1 TABLET TWICE A DAY  180 tablet  1  . omeprazole (PRILOSEC) 20 MG capsule TAKE 1 CAPSULE BY MOUTH DAILY  90 capsule  1  . polyethylene glycol (MIRALAX / GLYCOLAX) packet Take 17 g by mouth 2 (two) times daily as needed (Constipation).  60 each  0  . SYNTHROID 50 MCG tablet TAKE ONE TABLET BY MOUTH ONCE DAILY  90 tablet  1  . lisinopril (PRINIVIL,ZESTRIL) 5 MG tablet TAKE 1 TABLET EVERY DAY  90 tablet  3  . tolterodine (DETROL LA) 2 MG 24 hr capsule Take 1 capsule (2 mg total) by mouth daily.  30 capsule  5   No current facility-administered medications on file prior to visit.     Review of Systems Review of Systems  Constitutional: Negative for fever, appetite change, fatigue and unexpected weight change.  Eyes: Negative for pain and visual disturbance.  Respiratory: Negative for cough and shortness of breath.     Cardiovascular: Negative for cp or palpitations    Gastrointestinal: Negative for nausea, diarrhea and constipation.  Genitourinary: Negative for urgency and frequency.  Skin: Negative for pallor or rash   Neurological: Negative for weakness, light-headedness, numbness and headaches.  Hematological: Negative for adenopathy. Does not bruise/bleed easily.  Psychiatric/Behavioral: Negative for dysphoric mood. The patient is mildly  nervous/anxious.  pt is mildly irritable , she denies memory problems       Objective:   Physical Exam  Constitutional: She appears well-developed and well-nourished. No distress.  Frail appearing elderly female  HENT:  Head: Normocephalic and atraumatic.  Mouth/Throat: Oropharynx is clear and moist.  Eyes: Conjunctivae and EOM are normal. Pupils are equal, round, and reactive to light. Right eye exhibits no discharge. Left eye exhibits no discharge. No scleral icterus.  Neck: Normal range of motion. Neck supple. No JVD present. Carotid bruit is not present.  Cardiovascular: Normal rate and regular rhythm.   Pulmonary/Chest: Effort normal and breath sounds normal.  Abdominal: Soft. Bowel sounds are normal. She exhibits no distension and no mass. There is no tenderness.  No suprapubic tenderness or fullness   No cva tenderness   Lymphadenopathy:    She has no cervical adenopathy.  Neurological: She is alert.  Skin: Skin is warm and dry. No rash noted. No erythema. No pallor.  Psychiatric:  Somewhat irritable  A son mentioned memory issues-she denies this (they were enc to f/u to disc this)   She does seem quite easily confused about her medicines           Assessment & Plan:

## 2013-07-30 LAB — URINE CULTURE
Colony Count: NO GROWTH
Organism ID, Bacteria: NO GROWTH

## 2013-07-30 LAB — POCT UA - MICROSCOPIC ONLY
CASTS, UR, LPF, POC: 0
Crystals, Ur, HPF, POC: 0
RBC, urine, microscopic: 0
YEAST UA: 0

## 2013-07-30 NOTE — Assessment & Plan Note (Signed)
Imp with tx of uti  inst to stop [employer] of covered med for overactive bladder  Consider urology consult

## 2013-07-30 NOTE — Assessment & Plan Note (Signed)
ua improved  Sent for cx  Finished keflex  Some of her incont/overactive bladder sympt imp inst to stop uristat Call ins to find coverage of med for overactive bladder and update me

## 2013-08-01 ENCOUNTER — Other Ambulatory Visit: Payer: Self-pay | Admitting: Family Medicine

## 2013-08-05 ENCOUNTER — Other Ambulatory Visit: Payer: Self-pay | Admitting: Family Medicine

## 2013-08-09 ENCOUNTER — Encounter (HOSPITAL_COMMUNITY): Payer: Self-pay | Admitting: Emergency Medicine

## 2013-08-09 ENCOUNTER — Inpatient Hospital Stay (HOSPITAL_COMMUNITY)
Admission: EM | Admit: 2013-08-09 | Discharge: 2013-08-12 | DRG: 291 | Disposition: A | Discharge status: Home Health | Payer: Medicare Other | Attending: Internal Medicine | Admitting: Internal Medicine

## 2013-08-09 ENCOUNTER — Emergency Department (HOSPITAL_COMMUNITY): Payer: Medicare Other

## 2013-08-09 DIAGNOSIS — E1169 Type 2 diabetes mellitus with other specified complication: Secondary | ICD-10-CM | POA: Diagnosis not present

## 2013-08-09 DIAGNOSIS — Z954 Presence of other heart-valve replacement: Secondary | ICD-10-CM

## 2013-08-09 DIAGNOSIS — E119 Type 2 diabetes mellitus without complications: Secondary | ICD-10-CM

## 2013-08-09 DIAGNOSIS — I5033 Acute on chronic diastolic (congestive) heart failure: Principal | ICD-10-CM | POA: Diagnosis present

## 2013-08-09 DIAGNOSIS — Z952 Presence of prosthetic heart valve: Secondary | ICD-10-CM

## 2013-08-09 DIAGNOSIS — E039 Hypothyroidism, unspecified: Secondary | ICD-10-CM | POA: Diagnosis present

## 2013-08-09 DIAGNOSIS — Z79899 Other long term (current) drug therapy: Secondary | ICD-10-CM

## 2013-08-09 DIAGNOSIS — I059 Rheumatic mitral valve disease, unspecified: Secondary | ICD-10-CM

## 2013-08-09 DIAGNOSIS — J209 Acute bronchitis, unspecified: Secondary | ICD-10-CM | POA: Diagnosis present

## 2013-08-09 DIAGNOSIS — I252 Old myocardial infarction: Secondary | ICD-10-CM

## 2013-08-09 DIAGNOSIS — N390 Urinary tract infection, site not specified: Secondary | ICD-10-CM | POA: Diagnosis present

## 2013-08-09 DIAGNOSIS — M549 Dorsalgia, unspecified: Secondary | ICD-10-CM

## 2013-08-09 DIAGNOSIS — Z951 Presence of aortocoronary bypass graft: Secondary | ICD-10-CM

## 2013-08-09 DIAGNOSIS — J9620 Acute and chronic respiratory failure, unspecified whether with hypoxia or hypercapnia: Secondary | ICD-10-CM

## 2013-08-09 DIAGNOSIS — Z950 Presence of cardiac pacemaker: Secondary | ICD-10-CM

## 2013-08-09 DIAGNOSIS — J9600 Acute respiratory failure, unspecified whether with hypoxia or hypercapnia: Secondary | ICD-10-CM

## 2013-08-09 DIAGNOSIS — I5032 Chronic diastolic (congestive) heart failure: Secondary | ICD-10-CM

## 2013-08-09 DIAGNOSIS — I214 Non-ST elevation (NSTEMI) myocardial infarction: Secondary | ICD-10-CM

## 2013-08-09 DIAGNOSIS — J45901 Unspecified asthma with (acute) exacerbation: Secondary | ICD-10-CM

## 2013-08-09 DIAGNOSIS — I129 Hypertensive chronic kidney disease with stage 1 through stage 4 chronic kidney disease, or unspecified chronic kidney disease: Secondary | ICD-10-CM | POA: Diagnosis present

## 2013-08-09 DIAGNOSIS — K219 Gastro-esophageal reflux disease without esophagitis: Secondary | ICD-10-CM | POA: Diagnosis present

## 2013-08-09 DIAGNOSIS — D509 Iron deficiency anemia, unspecified: Secondary | ICD-10-CM

## 2013-08-09 DIAGNOSIS — I442 Atrioventricular block, complete: Secondary | ICD-10-CM

## 2013-08-09 DIAGNOSIS — I2510 Atherosclerotic heart disease of native coronary artery without angina pectoris: Secondary | ICD-10-CM

## 2013-08-09 DIAGNOSIS — I454 Nonspecific intraventricular block: Secondary | ICD-10-CM

## 2013-08-09 DIAGNOSIS — E785 Hyperlipidemia, unspecified: Secondary | ICD-10-CM | POA: Diagnosis present

## 2013-08-09 DIAGNOSIS — I447 Left bundle-branch block, unspecified: Secondary | ICD-10-CM | POA: Diagnosis present

## 2013-08-09 DIAGNOSIS — I4891 Unspecified atrial fibrillation: Secondary | ICD-10-CM | POA: Diagnosis present

## 2013-08-09 DIAGNOSIS — N289 Disorder of kidney and ureter, unspecified: Secondary | ICD-10-CM | POA: Diagnosis not present

## 2013-08-09 DIAGNOSIS — I509 Heart failure, unspecified: Secondary | ICD-10-CM

## 2013-08-09 DIAGNOSIS — N184 Chronic kidney disease, stage 4 (severe): Secondary | ICD-10-CM

## 2013-08-09 DIAGNOSIS — A498 Other bacterial infections of unspecified site: Secondary | ICD-10-CM | POA: Diagnosis present

## 2013-08-09 LAB — CBC WITH DIFFERENTIAL/PLATELET
BASOS ABS: 0 10*3/uL (ref 0.0–0.1)
BASOS PCT: 0 % (ref 0–1)
Eosinophils Absolute: 0 10*3/uL (ref 0.0–0.7)
Eosinophils Relative: 0 % (ref 0–5)
HCT: 27.7 % — ABNORMAL LOW (ref 36.0–46.0)
Hemoglobin: 9.5 g/dL — ABNORMAL LOW (ref 12.0–15.0)
Lymphocytes Relative: 10 % — ABNORMAL LOW (ref 12–46)
Lymphs Abs: 1.5 10*3/uL (ref 0.7–4.0)
MCH: 33 pg (ref 26.0–34.0)
MCHC: 34.3 g/dL (ref 30.0–36.0)
MCV: 96.2 fL (ref 78.0–100.0)
Monocytes Absolute: 1.5 10*3/uL — ABNORMAL HIGH (ref 0.1–1.0)
Monocytes Relative: 10 % (ref 3–12)
NEUTROS ABS: 12.4 10*3/uL — AB (ref 1.7–7.7)
Neutrophils Relative %: 80 % — ABNORMAL HIGH (ref 43–77)
Platelets: 194 10*3/uL (ref 150–400)
RBC: 2.88 MIL/uL — ABNORMAL LOW (ref 3.87–5.11)
RDW: 12.6 % (ref 11.5–15.5)
WBC: 15.5 10*3/uL — ABNORMAL HIGH (ref 4.0–10.5)

## 2013-08-09 LAB — URINALYSIS, ROUTINE W REFLEX MICROSCOPIC
Bilirubin Urine: NEGATIVE
Glucose, UA: NEGATIVE mg/dL
Ketones, ur: NEGATIVE mg/dL
Nitrite: NEGATIVE
Protein, ur: NEGATIVE mg/dL
SPECIFIC GRAVITY, URINE: 1.014 (ref 1.005–1.030)
UROBILINOGEN UA: 0.2 mg/dL (ref 0.0–1.0)
pH: 5 (ref 5.0–8.0)

## 2013-08-09 LAB — HEMOGLOBIN A1C
Hgb A1c MFr Bld: 6.3 % — ABNORMAL HIGH (ref <5.7)
Mean Plasma Glucose: 134 mg/dL — ABNORMAL HIGH (ref <117)

## 2013-08-09 LAB — IRON AND TIBC
Iron: 15 ug/dL — ABNORMAL LOW (ref 42–135)
SATURATION RATIOS: 8 % — AB (ref 20–55)
TIBC: 178 ug/dL — ABNORMAL LOW (ref 250–470)
UIBC: 163 ug/dL (ref 125–400)

## 2013-08-09 LAB — PRO B NATRIURETIC PEPTIDE: PRO B NATRI PEPTIDE: 7547 pg/mL — AB (ref 0–450)

## 2013-08-09 LAB — CREATININE, SERUM
CREATININE: 1.52 mg/dL — AB (ref 0.50–1.10)
GFR calc Af Amer: 37 mL/min — ABNORMAL LOW (ref >90)
GFR, EST NON AFRICAN AMERICAN: 32 mL/min — AB (ref >90)

## 2013-08-09 LAB — COMPREHENSIVE METABOLIC PANEL
ALBUMIN: 3.4 g/dL — AB (ref 3.5–5.2)
ALT: 9 U/L (ref 0–35)
AST: 17 U/L (ref 0–37)
Alkaline Phosphatase: 108 U/L (ref 39–117)
BILIRUBIN TOTAL: 0.7 mg/dL (ref 0.3–1.2)
BUN: 38 mg/dL — ABNORMAL HIGH (ref 6–23)
CHLORIDE: 104 meq/L (ref 96–112)
CO2: 20 mEq/L (ref 19–32)
CREATININE: 1.5 mg/dL — AB (ref 0.50–1.10)
Calcium: 8.5 mg/dL (ref 8.4–10.5)
GFR calc Af Amer: 38 mL/min — ABNORMAL LOW (ref >90)
GFR calc non Af Amer: 32 mL/min — ABNORMAL LOW (ref >90)
Glucose, Bld: 148 mg/dL — ABNORMAL HIGH (ref 70–99)
Potassium: 4 mEq/L (ref 3.7–5.3)
Sodium: 139 mEq/L (ref 137–147)
TOTAL PROTEIN: 7.4 g/dL (ref 6.0–8.3)

## 2013-08-09 LAB — URINE MICROSCOPIC-ADD ON

## 2013-08-09 LAB — CBC
HEMATOCRIT: 26 % — AB (ref 36.0–46.0)
Hemoglobin: 9 g/dL — ABNORMAL LOW (ref 12.0–15.0)
MCH: 33.2 pg (ref 26.0–34.0)
MCHC: 34.6 g/dL (ref 30.0–36.0)
MCV: 95.9 fL (ref 78.0–100.0)
Platelets: 188 10*3/uL (ref 150–400)
RBC: 2.71 MIL/uL — ABNORMAL LOW (ref 3.87–5.11)
RDW: 12.9 % (ref 11.5–15.5)
WBC: 13.8 10*3/uL — AB (ref 4.0–10.5)

## 2013-08-09 LAB — I-STAT TROPONIN, ED: Troponin i, poc: 0.11 ng/mL (ref 0.00–0.08)

## 2013-08-09 LAB — RETICULOCYTES
RBC.: 2.88 MIL/uL — AB (ref 3.87–5.11)
RETIC COUNT ABSOLUTE: 51.8 10*3/uL (ref 19.0–186.0)
RETIC CT PCT: 1.8 % (ref 0.4–3.1)

## 2013-08-09 LAB — POC OCCULT BLOOD, ED: Fecal Occult Bld: NEGATIVE

## 2013-08-09 LAB — TROPONIN I

## 2013-08-09 LAB — FERRITIN: Ferritin: 527 ng/mL — ABNORMAL HIGH (ref 10–291)

## 2013-08-09 LAB — GLUCOSE, CAPILLARY: GLUCOSE-CAPILLARY: 381 mg/dL — AB (ref 70–99)

## 2013-08-09 LAB — VITAMIN B12: VITAMIN B 12: 268 pg/mL (ref 211–911)

## 2013-08-09 LAB — FOLATE: FOLATE: 10.6 ng/mL

## 2013-08-09 LAB — OSMOLALITY: Osmolality: 301 mOsm/kg — ABNORMAL HIGH (ref 275–300)

## 2013-08-09 MED ORDER — ASPIRIN 325 MG PO TABS
325.0000 mg | ORAL_TABLET | Freq: Once | ORAL | Status: AC
  Administered 2013-08-09: 325 mg via ORAL
  Filled 2013-08-09: qty 1

## 2013-08-09 MED ORDER — FUROSEMIDE 10 MG/ML IJ SOLN
40.0000 mg | Freq: Once | INTRAMUSCULAR | Status: AC
Start: 2013-08-09 — End: 2013-08-09
  Administered 2013-08-09: 40 mg via INTRAVENOUS
  Filled 2013-08-09: qty 4

## 2013-08-09 MED ORDER — FLUTICASONE PROPIONATE 50 MCG/ACT NA SUSP
1.0000 | Freq: Every day | NASAL | Status: DC
  Administered 2013-08-09 – 2013-08-11 (×3): 1 via NASAL
  Filled 2013-08-09: qty 16

## 2013-08-09 MED ORDER — ATORVASTATIN CALCIUM 40 MG PO TABS
40.0000 mg | ORAL_TABLET | Freq: Every day | ORAL | Status: DC
  Administered 2013-08-09 – 2013-08-11 (×3): 40 mg via ORAL
  Filled 2013-08-09 (×4): qty 1

## 2013-08-09 MED ORDER — NITROGLYCERIN 2 % TD OINT
0.5000 [in_us] | TOPICAL_OINTMENT | Freq: Four times a day (QID) | TRANSDERMAL | Status: DC
Start: 2013-08-09 — End: 2013-08-11
  Administered 2013-08-09 – 2013-08-10 (×5): 0.5 [in_us] via TOPICAL
  Filled 2013-08-09: qty 30

## 2013-08-09 MED ORDER — SODIUM CHLORIDE 0.9 % IJ SOLN
3.0000 mL | Freq: Two times a day (BID) | INTRAMUSCULAR | Status: DC
  Administered 2013-08-09 – 2013-08-11 (×3): 3 mL via INTRAVENOUS

## 2013-08-09 MED ORDER — CARVEDILOL 6.25 MG PO TABS
18.7500 mg | ORAL_TABLET | Freq: Two times a day (BID) | ORAL | Status: DC
  Administered 2013-08-09 – 2013-08-12 (×6): 18.75 mg via ORAL
  Filled 2013-08-09 (×8): qty 1

## 2013-08-09 MED ORDER — POLYETHYLENE GLYCOL 3350 17 G PO PACK
17.0000 g | PACK | Freq: Every day | ORAL | Status: DC | PRN
  Filled 2013-08-09: qty 1

## 2013-08-09 MED ORDER — PREDNISONE 20 MG PO TABS
60.0000 mg | ORAL_TABLET | Freq: Once | ORAL | Status: AC
  Administered 2013-08-09: 60 mg via ORAL
  Filled 2013-08-09: qty 3

## 2013-08-09 MED ORDER — LEVOFLOXACIN 750 MG PO TABS
750.0000 mg | ORAL_TABLET | Freq: Once | ORAL | Status: AC
  Administered 2013-08-09: 750 mg via ORAL
  Filled 2013-08-09: qty 1

## 2013-08-09 MED ORDER — FESOTERODINE FUMARATE ER 8 MG PO TB24
8.0000 mg | ORAL_TABLET | Freq: Every day | ORAL | Status: DC
  Administered 2013-08-09 – 2013-08-12 (×4): 8 mg via ORAL
  Filled 2013-08-09 (×4): qty 1

## 2013-08-09 MED ORDER — ASPIRIN EC 81 MG PO TBEC
81.0000 mg | DELAYED_RELEASE_TABLET | Freq: Every day | ORAL | Status: DC
  Filled 2013-08-09: qty 1

## 2013-08-09 MED ORDER — INSULIN ASPART 100 UNIT/ML ~~LOC~~ SOLN
0.0000 [IU] | Freq: Every day | SUBCUTANEOUS | Status: DC
Start: 2013-08-09 — End: 2013-08-12
  Administered 2013-08-09: 5 [IU] via SUBCUTANEOUS

## 2013-08-09 MED ORDER — LEVOTHYROXINE SODIUM 50 MCG PO TABS
50.0000 ug | ORAL_TABLET | Freq: Every day | ORAL | Status: DC
  Administered 2013-08-10 – 2013-08-12 (×3): 50 ug via ORAL
  Filled 2013-08-09 (×4): qty 1

## 2013-08-09 MED ORDER — INSULIN ASPART 100 UNIT/ML ~~LOC~~ SOLN
0.0000 [IU] | Freq: Three times a day (TID) | SUBCUTANEOUS | Status: DC
Start: 2013-08-09 — End: 2013-08-12
  Administered 2013-08-10 (×2): 3 [IU] via SUBCUTANEOUS
  Administered 2013-08-11 (×2): 1 [IU] via SUBCUTANEOUS

## 2013-08-09 MED ORDER — LISINOPRIL 5 MG PO TABS
5.0000 mg | ORAL_TABLET | Freq: Every day | ORAL | Status: DC
  Administered 2013-08-09: 5 mg via ORAL
  Filled 2013-08-09 (×2): qty 1

## 2013-08-09 MED ORDER — AMLODIPINE BESYLATE 10 MG PO TABS
10.0000 mg | ORAL_TABLET | Freq: Every day | ORAL | Status: DC
  Administered 2013-08-09 – 2013-08-12 (×4): 10 mg via ORAL
  Filled 2013-08-09 (×4): qty 1

## 2013-08-09 MED ORDER — PANTOPRAZOLE SODIUM 40 MG PO TBEC
40.0000 mg | DELAYED_RELEASE_TABLET | Freq: Every day | ORAL | Status: DC
Start: 2013-08-09 — End: 2013-08-12
  Administered 2013-08-09 – 2013-08-12 (×4): 40 mg via ORAL
  Filled 2013-08-09 (×4): qty 1

## 2013-08-09 MED ORDER — HYDROCODONE-ACETAMINOPHEN 5-325 MG PO TABS: 1.0000 | ORAL_TABLET | ORAL | Status: DC | PRN

## 2013-08-09 MED ORDER — GLIPIZIDE ER 10 MG PO TB24
10.0000 mg | ORAL_TABLET | Freq: Two times a day (BID) | ORAL | Status: DC
  Administered 2013-08-09: 10 mg via ORAL
  Filled 2013-08-09 (×4): qty 1

## 2013-08-09 MED ORDER — PNEUMOCOCCAL VAC POLYVALENT 25 MCG/0.5ML IJ INJ
0.5000 mL | INJECTION | INTRAMUSCULAR | Status: DC
  Filled 2013-08-09: qty 0.5

## 2013-08-09 MED ORDER — IPRATROPIUM-ALBUTEROL 0.5-2.5 (3) MG/3ML IN SOLN
3.0000 mL | Freq: Once | RESPIRATORY_TRACT | Status: AC
  Administered 2013-08-09: 3 mL via RESPIRATORY_TRACT
  Filled 2013-08-09: qty 3

## 2013-08-09 MED ORDER — HEPARIN SODIUM (PORCINE) 5000 UNIT/ML IJ SOLN
5000.0000 [IU] | Freq: Three times a day (TID) | INTRAMUSCULAR | Status: DC
  Administered 2013-08-09: 5000 [IU] via SUBCUTANEOUS
  Filled 2013-08-09 (×6): qty 1

## 2013-08-09 MED ORDER — FUROSEMIDE 10 MG/ML IJ SOLN
40.0000 mg | Freq: Every day | INTRAMUSCULAR | Status: DC
  Filled 2013-08-09: qty 4

## 2013-08-09 MED ORDER — ALUM & MAG HYDROXIDE-SIMETH 200-200-20 MG/5ML PO SUSP
30.0000 mL | Freq: Four times a day (QID) | ORAL | Status: DC | PRN
  Administered 2013-08-10: 30 mL via ORAL
  Filled 2013-08-09: qty 30

## 2013-08-09 MED ORDER — ONDANSETRON HCL 4 MG/2ML IJ SOLN
4.0000 mg | Freq: Four times a day (QID) | INTRAMUSCULAR | Status: DC | PRN
  Filled 2013-08-09: qty 2

## 2013-08-09 MED ORDER — LEVOFLOXACIN 500 MG PO TABS
500.0000 mg | ORAL_TABLET | ORAL | Status: DC
  Administered 2013-08-11: 500 mg via ORAL
  Filled 2013-08-09: qty 1

## 2013-08-09 MED ORDER — GUAIFENESIN-DM 100-10 MG/5ML PO SYRP
5.0000 mL | ORAL_SOLUTION | ORAL | Status: DC | PRN
  Filled 2013-08-09: qty 5

## 2013-08-09 MED ORDER — ONDANSETRON HCL 4 MG PO TABS
4.0000 mg | ORAL_TABLET | Freq: Four times a day (QID) | ORAL | Status: DC | PRN
  Administered 2013-08-10: 4 mg via ORAL

## 2013-08-09 NOTE — ED Notes (Signed)
Admitting MD at bedside.

## 2013-08-09 NOTE — Progress Notes (Signed)
  Echocardiogram 2D Echocardiogram has been performed.  Carney Corners 08/09/2013, 1:56 PM

## 2013-08-09 NOTE — ED Notes (Signed)
MD at bedside. 

## 2013-08-09 NOTE — ED Provider Notes (Signed)
CSN: [id number]     Arrival date & time 08/09/13  [id number] History   First MD Initiated Contact with Patient 08/09/13 [phone]     Chief Complaint  Patient presents with  . Shortness of Breath     (Consider location/radiation/quality/duration/timing/severity/associated sxs/prior Treatment) The history is provided by the patient.  Barbara Garner is a 79 y.o. female hx of CAD s/p CABG, CHF, pacemaker, presenting with shortness of breath and cough. Coughing with dry cough for the last 3-4 days. Worsening shortness of breath today so she called EMS. Denies any chest pain or fever. She was noted to be wheezing by EMS and was given albuterol 5 mg. She states that her shortness of breath is slightly better but she is still short of breath. She does have a history of CAD, CHF. Had aortic valve replacement previously but is not currently on anticoagulation.    Past Medical History  Diagnosis Date  . Iron deficiency anemia, unspecified   . Chronic diastolic CHF (congestive heart failure)   . CAD  S/P CABG x 3 08/2011     LIMA to diagonal branch, SVG to OM1, SVG to PDA, EVH via bilateral thighs  . Type II or unspecified type diabetes mellitus without mention of complication, not stated as uncontrolled   . Contact dermatitis and other eczema, due to unspecified cause   . Esophageal reflux   . Headache(784.0)   . Pacemaker MDT dual chamber     DOI 08/2011  . Pure hypercholesterolemia   . hypertension   . Hypothyroidism   . Atrioventricular block, complete -intermittent   . Memory loss   . Idiopathic peripheral neuropathy   . LBBB (left bundle branch block)   . Carotid stenosis     Carotid dopplers 0000000 with [id number] LICA stenosis  . Severe aortic stenosis -S/P AVR      19 mm [facility] Ease pericardial tissue valve  . Extrinsic asthma, unspecified     hasn't used inhaler in past year  . CKD (chronic kidney disease) stage 4, GFR 15-29 ml/min     Cr 1.6 in 6/11, sees Dr. Arty Baumgartner  . Impaired vision      pt. reports that she identifies her meds by looking at the pillls, she is not able to read labels on bottles   Past Surgical History  Procedure Laterality Date  . Ptca  [phone]    5 blockages  . Cardiac catheterization    . Esophageal dilation  1997  . Colonoscopy  04/1999    1 polyp  . Esophagogastroduodenoscopy  04/1999    HH; "watermelon stomach" (severe gastritis)  . Dexa  10/2003 ,09/2005    osteoporosis,  Osteopenia  . Cataract extraction, bilateral  2003  . Esophagogastroduodenoscopy  04/2002    Gastritis  . Cardiovascular stress test  10/2002    normal (per patient)  . Abdominal hysterectomy  1977    partial; fibroids  . Bladder tack  1977  . Colonoscopy  06/2004    Neg. Int hem  . Carotid dopplers  2006  . Refractive surgery  2006  . Opthy  11/1998;12/01;11/02  . Aortic valve replacement  09/06/2011    Procedure: AORTIC VALVE REPLACEMENT (AVR);  Surgeon: Rexene Alberts, MD;  Location: [place];  Service: Open Heart Surgery;  Laterality: N/A;  . Coronary artery bypass graft  09/06/2011    Procedure: CORONARY ARTERY BYPASS GRAFTING (CABG);  Surgeon: Rexene Alberts, MD;  Location: [place];  Service: Open Heart Surgery;  Laterality: N/A;   Family History  Problem Relation Age of Onset  . Stroke Father     died in his 19's  . Stroke Mother     died in her 49's   History  Substance Use Topics  . Smoking status: Never Smoker   . Smokeless tobacco: Not on file  . Alcohol Use: No   OB History   Grav Para Term Preterm Abortions TAB SAB Ect Mult Living                 Review of Systems  Respiratory: Positive for shortness of breath.   All other systems reviewed and are negative.     Allergies  Metformin and Promethazine hcl  Home Medications   Prior to Admission medications   Medication Sig Start Date End Date Taking? Authorizing Provider  amLODipine (NORVASC) 10 MG tablet Take 10 mg by mouth daily.     Historical Provider, MD  atorvastatin (LIPITOR) 40 MG  tablet TAKE ONE TABLET BY MOUTH ONCE DAILY 08/01/13   Abner Greenspan, MD  carvedilol (COREG) 12.5 MG tablet TAKE 1 AND 1/2 TABLETS BY MOUTH TWICE A DAY 09/24/12   Renella Cunas, MD  Cholecalciferol (VITAMIN D-3) 1000 UNITS CAPS Take 1 capsule by mouth daily.    Historical Provider, MD  fluticasone (FLONASE) 50 MCG/ACT nasal spray Place 1 spray into both nostrils daily.    Historical Provider, MD  furosemide (LASIX) 20 MG tablet TAKE 1 TABLET BY MOUTH EVERY OTHER DAY 10/12/12   Wynelle Fanny Tower, MD  glipiZIDE (GLUCOTROL XL) 10 MG 24 hr tablet TAKE 1 TABLET TWICE A DAY 01/31/13   Abner Greenspan, MD  lisinopril (PRINIVIL,ZESTRIL) 5 MG tablet TAKE 1 TABLET EVERY DAY 11/07/12   Deboraha Sprang, MD  omeprazole (PRILOSEC) 20 MG capsule TAKE ONE CAPSULE BY MOUTH ONCE DAILY 08/05/13   Abner Greenspan, MD  phenazopyridine (PYRIDIUM) 95 MG tablet Take 95 mg by mouth 3 (three) times daily as needed for pain.    Historical Provider, MD  polyethylene glycol (MIRALAX / GLYCOLAX) packet Take 17 g by mouth 2 (two) times daily as needed (Constipation). 03/23/12   Bynum Bellows, MD  SYNTHROID 50 MCG tablet TAKE ONE TABLET BY MOUTH ONCE DAILY 07/03/13   Abner Greenspan, MD  tolterodine (DETROL LA) 2 MG 24 hr capsule Take 1 capsule (2 mg total) by mouth daily. 07/16/13   Wynelle Fanny Tower, MD   BP 137/57  Pulse 84  Temp(Src) 98.7 F (37.1 C) (Oral)  Resp 23  SpO2 95% Physical Exam  Nursing note and vitals reviewed. Constitutional: She is oriented to person, place, and time.  Chronically ill, slightly tachypneic   HENT:  Head: Normocephalic.  Mouth/Throat: Oropharynx is clear and moist.  Eyes: Conjunctivae and EOM are normal. Pupils are equal, round, and reactive to light.  Neck: Normal range of motion. Neck supple.  Cardiovascular: Normal rate, regular rhythm and normal heart sounds.   Pulmonary/Chest:  Slightly tachypneic. Talking in full sentences. + diffuse wheezing, no retractions.   Abdominal: Soft. Bowel sounds are normal.  She exhibits no distension. There is no tenderness. There is no rebound and no guarding.  Musculoskeletal: Normal range of motion. She exhibits no edema and no tenderness.  Neurological: She is alert and oriented to person, place, and time. No cranial nerve deficit. Coordination normal.  Skin: Skin is warm and dry.  Psychiatric: She has a normal mood and affect. Her behavior is normal. Judgment  and thought content normal.    ED Course  Procedures (including critical care time)  CRITICAL CARE Performed by: Wandra Arthurs   Total critical care time: 30 min   Critical care time was exclusive of separately billable procedures and treating other patients.  Critical care was necessary to treat or prevent imminent or life-threatening deterioration.  Critical care was time spent personally by me on the following activities: development of treatment plan with patient and/or surrogate as well as nursing, discussions with consultants, evaluation of patient's response to treatment, examination of patient, obtaining history from patient or surrogate, ordering and performing treatments and interventions, ordering and review of laboratory studies, ordering and review of radiographic studies, pulse oximetry and re-evaluation of patient's condition.   Labs Review Labs Reviewed  CBC WITH DIFFERENTIAL - Abnormal; Notable for the following:    WBC 15.5 (*)    RBC 2.88 (*)    Hemoglobin 9.5 (*)    HCT 27.7 (*)    Neutrophils Relative % 80 (*)    Neutro Abs 12.4 (*)    Lymphocytes Relative 10 (*)    Monocytes Absolute 1.5 (*)    All other components within normal limits  COMPREHENSIVE METABOLIC PANEL - Abnormal; Notable for the following:    Glucose, Bld 148 (*)    BUN 38 (*)    Creatinine, Ser 1.50 (*)    Albumin 3.4 (*)    GFR calc non Af Amer 32 (*)    GFR calc Af Amer 38 (*)    All other components within normal limits  PRO B NATRIURETIC PEPTIDE - Abnormal; Notable for the following:    Pro B  Natriuretic peptide (BNP) 7547.0 (*)    All other components within normal limits  I-STAT TROPOININ, ED - Abnormal; Notable for the following:    Troponin i, poc 0.11 (*)    All other components within normal limits  URINE CULTURE  URINALYSIS, ROUTINE W REFLEX MICROSCOPIC  POC OCCULT BLOOD, ED    Imaging Review Dg Chest 2 View  08/09/2013   CLINICAL DATA:  Worsening shortness of breath  EXAM: CHEST - 2 VIEW  COMPARISON:  12/11/2011  FINDINGS: Previous CABG and AVR. Left subclavian pacemaker stable. Mild cardiomegaly. Probable moderate hiatal hernia. Atheromatous aorta. Mild perihilar and bibasilar interstitial edema with septal lines seen peripherally in both lung bases. No effusion. Marland Kitchen Spondylitic changes in the visualized upper lumbar spine.  IMPRESSION: 1. Mild interstitial edema, new since prior study. 2. Hiatal hernia. 3. Stable chronic and postoperative changes.   Electronically Signed   By: Arne Cleveland M.D.   On: 08/09/2013 09:34     EKG Interpretation   Date/Time:  Saturday Aug 09 2013 08:49:32 EDT Ventricular Rate:  85 PR Interval:  161 QRS Duration: 142 QT Interval:  415 QTC Calculation: 493 R Axis:   -49 Text Interpretation:  Sinus rhythm Left bundle branch block No significant  change since last tracing Confirmed by YAO  MD, DAVID ([id number]) on 08/09/2013  8:52:25 AM      MDM   Final diagnoses:  None   Barbara Garner is a 79 y.o. female here with SOB, cough. Likely asthma exacerbation. However, given hx of CHF and CAD, will get trop x 1, BNP, CXR. Will give nebs, steroids and reassess.   10:30 AM Trop positive. Given ASA. Hg dropped but occ neg and brown stool. CXR showed interstitial edema, BNP elevated. Given lasix for CHF. Cardiology, Dr. Wynonia Lawman, called. Will hold off heparin  for now. Cardiology will follow but will admit to medicine.   10:59 AM Will admit to tele for NSTEMI, heart failure, asthma.    Wandra Arthurs, MD 08/09/13 1100

## 2013-08-09 NOTE — Consult Note (Signed)
ANTIBIOTIC CONSULT NOTE - INITIAL  Pharmacy Consult for levaquin Indication: UTI  Allergies  Allergen Reactions  . Metformin     REACTION: intolerant  . Promethazine Hcl     REACTION: u/k    Patient Measurements: Height: 5\' 2"  (157.5 cm) Weight: 113 lb 4 oz (51.37 kg) IBW/kg (Calculated) : 50.1  Vital Signs: Temp: 98.4 F (36.9 C) (05/23 1228) Temp src: Oral (05/23 1228) BP: 124/54 mmHg (05/23 1228) Pulse Rate: 86 (05/23 1228) Intake/Output from previous day:   Intake/Output from this shift:    Labs:  Recent Labs  08/09/13 0850  WBC 15.5*  HGB 9.5*  PLT 194  CREATININE 1.50*   Estimated Creatinine Clearance: 24.8 ml/min (by C-G formula based on Cr of 1.5).   Microbiology: Recent Results (from the past 720 hour(s))  URINE CULTURE     Status: None   Collection Time    07/29/13 10:52 AM      Result Value Ref Range Status   Colony Count NO GROWTH   Final   Organism ID, Bacteria NO GROWTH   Final    Medical History: Past Medical History  Diagnosis Date  . Iron deficiency anemia, unspecified   . Chronic diastolic CHF (congestive heart failure)   . CAD  S/P CABG x 3 08/2011     LIMA to diagonal branch, SVG to OM1, SVG to PDA, EVH via bilateral thighs  . Type II or unspecified type diabetes mellitus without mention of complication, not stated as uncontrolled   . Contact dermatitis and other eczema, due to unspecified cause   . Esophageal reflux   . Headache(784.0)   . Pacemaker MDT dual chamber     DOI 08/2011  . Pure hypercholesterolemia   . hypertension   . Hypothyroidism   . Atrioventricular block, complete -intermittent   . Memory loss   . Idiopathic peripheral neuropathy   . LBBB (left bundle branch block)   . Carotid stenosis     Carotid dopplers 0000000 with [id number] LICA stenosis  . Severe aortic stenosis -S/P AVR      19 mm [facility] Ease pericardial tissue valve  . Extrinsic asthma, unspecified     hasn't used inhaler in past year  . CKD  (chronic kidney disease) stage 4, GFR 15-29 ml/min     Cr 1.6 in 6/11, sees Dr. Arty Baumgartner  . Impaired vision     pt. reports that she identifies her meds by looking at the pillls, she is not able to read labels on bottles    Medications:  Scheduled:  . amLODipine  10 mg Oral Daily  . [START ON 08/10/2013] aspirin EC  81 mg Oral Daily  . atorvastatin  40 mg Oral q1800  . carvedilol  18.75 mg Oral BID WC  . fesoterodine  8 mg Oral Daily  . fluticasone  1 spray Each Nare Daily  . [START ON 08/10/2013] furosemide  40 mg Intravenous Daily  . glipiZIDE  10 mg Oral BID WC  . heparin  5,000 Units Subcutaneous 3 times per day  . insulin aspart  0-5 Units Subcutaneous QHS  . insulin aspart  0-9 Units Subcutaneous TID WC  . levofloxacin  750 mg Oral Once   Followed by  . [START ON 08/11/2013] levofloxacin  500 mg Oral Q48H  . [START ON 08/10/2013] levothyroxine  50 mcg Oral QAC breakfast  . lisinopril  5 mg Oral Daily  . nitroGLYCERIN  0.5 inch Topical 4 times per day  . pantoprazole  40 mg Oral Daily  . sodium chloride  3 mL Intravenous Q12H   Assessment: 79 yo F admitted with URI to start Levaquin per pharmacy.   Patient is afebrile. WBC elevated at 15.5. CrCl ~ 25 ml/min.  5/23 urine >> sent  Goal of Therapy:  Appropriate antibiotic dosing  Plan:  -Levaquin 750 mg PO x 1 dose, followed by 500 mg PO q 48 hours -f/u clinical progression, antibiotic plans, renal function  Jayant Kriz C. Adylee Leonardo, PharmD Clinical Pharmacist-Resident Pager: [phone] Pharmacy: [phone] 08/09/2013 12:51 PM

## 2013-08-09 NOTE — ED Notes (Signed)
Abnormal labs given to Dr. Yao 

## 2013-08-09 NOTE — H&P (Addendum)
Patient Demographics  Barbara Garner, is a 79 y.o. female  MRN: [id number]   DOB - June 30, 1934  Admit Date - 08/09/2013  Outpatient Primary MD for the patient is Loura Pardon, MD   With History of -  Past Medical History  Diagnosis Date  . Iron deficiency anemia, unspecified   . Chronic diastolic CHF (congestive heart failure)   . CAD  S/P CABG x 3 08/2011     LIMA to diagonal branch, SVG to OM1, SVG to PDA, EVH via bilateral thighs  . Type II or unspecified type diabetes mellitus without mention of complication, not stated as uncontrolled   . Contact dermatitis and other eczema, due to unspecified cause   . Esophageal reflux   . Headache(784.0)   . Pacemaker MDT dual chamber     DOI 08/2011  . Pure hypercholesterolemia   . hypertension   . Hypothyroidism   . Atrioventricular block, complete -intermittent   . Memory loss   . Idiopathic peripheral neuropathy   . LBBB (left bundle branch block)   . Carotid stenosis     Carotid dopplers 0000000 with [id number] LICA stenosis  . Severe aortic stenosis -S/P AVR      19 mm [facility] Ease pericardial tissue valve  . Extrinsic asthma, unspecified     hasn't used inhaler in past year  . CKD (chronic kidney disease) stage 4, GFR 15-29 ml/min     Cr 1.6 in 6/11, sees Dr. Arty Baumgartner  . Impaired vision     pt. reports that she identifies her meds by looking at the pillls, she is not able to read labels on bottles      Past Surgical History  Procedure Laterality Date  . Ptca  [phone]    5 blockages  . Cardiac catheterization    . Esophageal dilation  1997  . Colonoscopy  04/1999    1 polyp  . Esophagogastroduodenoscopy  04/1999    HH; "watermelon stomach" (severe gastritis)  . Dexa  10/2003 ,09/2005    osteoporosis,  Osteopenia  . Cataract extraction, bilateral  2003    . Esophagogastroduodenoscopy  04/2002    Gastritis  . Cardiovascular stress test  10/2002    normal (per patient)  . Abdominal hysterectomy  1977    partial; fibroids  . Bladder tack  1977  . Colonoscopy  06/2004    Neg. Int hem  . Carotid dopplers  2006  . Refractive surgery  2006  . Opthy  11/1998;12/01;11/02  .  Aortic valve replacement  09/06/2011    Procedure: AORTIC VALVE REPLACEMENT (AVR);  Surgeon: Rexene Alberts, MD;  Location: [place];  Service: Open Heart Surgery;  Laterality: N/A;  . Coronary artery bypass graft  09/06/2011    Procedure: CORONARY ARTERY BYPASS GRAFTING (CABG);  Surgeon: Rexene Alberts, MD;  Location: [place];  Service: Open Heart Surgery;  Laterality: N/A;    in for   Chief Complaint  Patient presents with  . Shortness of Breath     HPI  Barbara Garner  is a 79 y.o. female, with history of CAD status post CABG, Chronic diastolic dysfunction EF in 2013 was 60%, Complete heart block status post pacemaker  placement, status post aortic valve replacement (bovine), LBBB, HTN, Hypothyroidism, Dyslipidemia, CK D4 baseline creatinine around 1.5, iron deficiency anemia, type 2 diabetes mellitus, GERD presents to the ER with 2-3 day history of a dry cough, mild wheezing and exertional shortness of breath, denies any chest pain or palpitations, no fever chills, no long travel, no exposure to sick contacts, no weight gain or edema in legs, no orthopnea. In the ER after receiving some Lasix she feels a whole lot better is in no distress, denies any abdominal pain or discomfort, no blood in stool or urine no melena. No focal weakness.   In the ER workup consistent of acute on chronic respiratory failure due to acute on chronic diastolic CHF along with possible mild URI, point of care troponin was mildly elevated suggesting to a possible NSTEMI, case was discussed with cardiologist Dr. Wynonia Lawman by ER physician who requested hospitalist admission and agreed to do a  consultation.    Review of Systems    In addition to the HPI above,  No Fever-chills, No Headache, No changes with Vision or hearing, No problems swallowing food or Liquids, No Chest pain, positive Cough and Shortness of Breath, no orthopnea, no edema in legs No Abdominal pain, No Nausea or Vommitting, Bowel movements are regular, No Blood in stool or Urine, No dysuria, No new skin rashes or bruises, No new joints pains-aches,  No new weakness, tingling, numbness in any extremity, No recent weight gain or loss, No polyuria, polydypsia or polyphagia, No significant Mental Stressors.  A full 10 point Review of Systems was done, except as stated above, all other Review of Systems were negative.   Social History History  Substance Use Topics  . Smoking status: Never Smoker   . Smokeless tobacco: Not on file  . Alcohol Use: No      Family History Family History  Problem Relation Age of Onset  . Stroke Father     died in his 24's  . Stroke Mother     died in her 73's      Prior to Admission medications   Medication Sig Start Date End Date Taking? Authorizing Provider  amLODipine (NORVASC) 10 MG tablet Take 10 mg by mouth daily.    Yes Historical Provider, MD  atorvastatin (LIPITOR) 40 MG tablet Take 40 mg by mouth daily.   Yes Historical Provider, MD  carvedilol (COREG) 12.5 MG tablet Take 18.75 mg by mouth 2 (two) times daily with a meal.   Yes Historical Provider, MD  Cholecalciferol (VITAMIN D-3) 1000 UNITS CAPS Take 1 capsule by mouth daily.   Yes Historical Provider, MD  fluticasone (FLONASE) 50 MCG/ACT nasal spray Place 1 spray into both nostrils daily.   Yes Historical Provider, MD  furosemide (LASIX) 20 MG tablet Take 20 mg by  mouth every other day.   Yes Historical Provider, MD  glipiZIDE (GLUCOTROL XL) 10 MG 24 hr tablet Take 10 mg by mouth 2 (two) times daily.   Yes Historical Provider, MD  levothyroxine (SYNTHROID, LEVOTHROID) 50 MCG tablet Take 50 mcg by  mouth daily before breakfast.   Yes Historical Provider, MD  lisinopril (PRINIVIL,ZESTRIL) 5 MG tablet Take 5 mg by mouth daily.   Yes Historical Provider, MD  omeprazole (PRILOSEC) 20 MG capsule Take 20 mg by mouth daily.   Yes Historical Provider, MD  tolterodine (DETROL LA) 2 MG 24 hr capsule Take 1 capsule (2 mg total) by mouth daily. 07/16/13  Yes Abner Greenspan, MD    Allergies  Allergen Reactions  . Metformin     REACTION: intolerant  . Promethazine Hcl     REACTION: u/k    Physical Exam  Vitals  Blood pressure 127/60, pulse 88, temperature 98.7 F (37.1 C), temperature source Oral, resp. rate 21, SpO2 95.00%.   1. General elderly frail white female lying in bed in NAD,     2. Normal affect and insight, Not Suicidal or Homicidal, Awake Alert, Oriented X 3.  3. No F.N deficits, ALL C.Nerves Intact, Strength 5/5 all 4 extremities, Sensation intact all 4 extremities, Plantars down going.  4. Ears and Eyes appear Normal, Conjunctivae clear, PERRLA. Moist Oral Mucosa.  5. Supple Neck, No JVD, No cervical lymphadenopathy appriciated, No Carotid Bruits.  6. Symmetrical Chest wall movement, Good air movement bilaterally, few bibasilar crackles and faint wheezing.  7. RRR, No Gallops, Rubs or Murmurs, No Parasternal Heave.  8. Positive Bowel Sounds, Abdomen Soft, No tenderness, No organomegaly appriciated,No rebound -guarding or rigidity.  9.  No Cyanosis, Normal Skin Turgor, No Skin Rash or Bruise.  10. Good muscle tone,  joints appear normal , no effusions, Normal ROM.  11. No Palpable Lymph Nodes in Neck or Axillae     Data Review  CBC  Recent Labs Lab 08/09/13 0850  WBC 15.5*  HGB 9.5*  HCT 27.7*  PLT 194  MCV 96.2  MCH 33.0  MCHC 34.3  RDW 12.6  LYMPHSABS 1.5  MONOABS 1.5*  EOSABS 0.0  BASOSABS 0.0   ------------------------------------------------------------------------------------------------------------------  Chemistries   Recent Labs Lab  08/09/13 0850  NA 139  K 4.0  CL 104  CO2 20  GLUCOSE 148*  BUN 38*  CREATININE 1.50*  CALCIUM 8.5  AST 17  ALT 9  ALKPHOS 108  BILITOT 0.7   ------------------------------------------------------------------------------------------------------------------ CrCl is unknown because both a height and weight (above a minimum accepted value) are required for this calculation. ------------------------------------------------------------------------------------------------------------------ No results found for this basename: TSH, T4TOTAL, FREET3, T3FREE, THYROIDAB,  in the last 72 hours   Coagulation profile No results found for this basename: INR, PROTIME,  in the last 168 hours ------------------------------------------------------------------------------------------------------------------- No results found for this basename: DDIMER,  in the last 72 hours -------------------------------------------------------------------------------------------------------------------  Cardiac Enzymes No results found for this basename: CK, CKMB, TROPONINI, MYOGLOBIN,  in the last 168 hours ------------------------------------------------------------------------------------------------------------------ No components found with this basename: POCBNP,    ---------------------------------------------------------------------------------------------------------------  Urinalysis    Component Value Date/Time   COLORURINE YELLOW 03/22/2012 [place] 03/22/2012 1134   LABSPEC 1.014 03/22/2012 1134   PHURINE 5.0 03/22/2012 [place] 03/22/2012 [employer] 03/22/2012 1134   BILIRUBINUR Comment: unable to read due to pt taking AZO 07/29/2013 [place] 03/22/2012 1134   [place] 03/22/2012 1134  PROTEINUR Comment: unable to read due to pt taking AZO 07/29/2013 1019   PROTEINUR 30* 03/22/2012 1134   UROBILINOGEN Comment: unable to read due to pt  taking AZO 07/29/2013 1019   UROBILINOGEN 0.2 03/22/2012 1134   NITRITE Comment: unable to read due to pt taking AZO 07/29/2013 1019   NITRITE NEGATIVE 03/22/2012 1134   LEUKOCYTESUR Comment: unable to read due to pt taking AZO 07/29/2013 1019    ----------------------------------------------------------------------------------------------------------------  Imaging results:   Dg Chest 2 View  08/09/2013   CLINICAL DATA:  Worsening shortness of breath  EXAM: CHEST - 2 VIEW  COMPARISON:  12/11/2011  FINDINGS: Previous CABG and AVR. Left subclavian pacemaker stable. Mild cardiomegaly. Probable moderate hiatal hernia. Atheromatous aorta. Mild perihilar and bibasilar interstitial edema with septal lines seen peripherally in both lung bases. No effusion. Marland Kitchen Spondylitic changes in the visualized upper lumbar spine.  IMPRESSION: 1. Mild interstitial edema, new since prior study. 2. Hiatal hernia. 3. Stable chronic and postoperative changes.   Electronically Signed   By: Arne Cleveland M.D.   On: 08/09/2013 09:34    My personal review of EKG: Rhythm NSR, LBBB Rate  85 /min     Assessment & Plan   1. Acute respiratory failure due to acute on chronic diastolic CHF along with mild acute bronchitis - admit to telemetry bed, she is much improved after IV Lasix, place her on fluid and salt restriction, IV Lasix 40 mg daily, nitro paste, continue home dose beta blocker. Repeat echogram. Last known EF is 60%. Check sputum Gram stain and culture. We'll place her on oral Levaquin and monitor.    2. NSTEMI in a patient with history of CAD and CABG - point-of-care troponin mildly elevated, will cycle troponins in the lab, could be elevated due to CHF, she's chest pain-free, old left bundle branch block on EKG, will place her on low-dose aspirin, nitro paste for CHF, continue blocker and statin for secondary prevention. Cardiology to evaluate.    3. History of bovine aortic valve - no acute issues, echo  repeated.    4. History of complete heart block status post placement of placement. No acute issues monitor    5. Diabetes mellitus type 2. Check A1c, continue Glucotrol at home dose, sliding scale added.    6. CK D. stage IV. Baseline creatinine around 1.4 to 1.5. Close to baseline, closely monitor with diuresis.    7. History of iron deficiency anemia. Hemoccult-negative, no history of melena or blood in stool. Check baseline anemia panel. Likely slightly lower due to mild fluid overload causing dilution, monitor CBC.    8. GERD. On PPI.    9. Dyslipidemia. Continue statin home dose.    10. Essential hypertension. Currently on Norvasc and Coreg which will be continued, monitor blood pressure.    11. Old history of A. fib in the chart. Currently in sinus monitor on telemetry.    12. Hypothyroidism. Continue home dose Synthroid.     13. Mild leukocytosis. Likely due to mild acute bronchitis. Will check sputum Gram stain, oral Levaquin, check UA.      DVT Prophylaxis Heparin - SCDs    AM Labs Ordered, also please review Full Orders  Family Communication: Admission, patients condition and plan of care including tests being ordered have been discussed with the patient and son who indicate understanding and agree with the plan and Code Status.  Code Status full  Likely DC to home  Condition Fair  Time spent in minutes :  [place] M.D on 08/09/2013 at 11:17 AM  Between 7am to 7pm - Pager - [phone]  After 7pm go to [email or web address] - password [email or web address]  And look for the night coverage person covering me after hours  [facility] Office  [phone]   **Disclaimer: This note may have been dictated with voice recognition software. Similar sounding words can inadvertently be transcribed and this note may contain transcription errors which may not have been corrected upon publication of note.**

## 2013-08-09 NOTE — Consult Note (Addendum)
Referring Physician: P. Singh Primary Cardiologist: SK Reason for Consultation: HF   HPI:  79 y/o woman with multiple medical problems including diastolic HF, CAD & AS s/p AVR and CABG 6/13, CHB s/p PPM, LBBB and CKD admitted with HF. We are asked to consult to assist with management.   She is here with her son. They are difficult historians. Apparently had 2 days of increased dyspnea. No fevers, chills, CP or LE edema. Does not weight at home. Called EMS this am. Found to have diffuse wheezing. Given albuterol.   In ED still wheezing. CXR mild CHF.  pBNP 7547. Given lasix. Breathing much better.   Review of Systems:     Cardiac Review of Systems: {Y] = yes [ ]  = no  Chest Pain [    ]  Resting SOB Blue.Reese   ] Exertional SOB  [ y ]  Orthopnea [ y ]   Pedal Edema [   ]    Palpitations [  ] Syncope  [  ]   Presyncope [   ]  General Review of Systems: [Y] = yes [  ]=no Constitional: recent weight change [  ]; anorexia [  ]; fatigue Blue.Reese  ]; nausea [  ]; night sweats [  ]; fever [  ]; or chills [  ];                                                                      Eyes : blurred vision [  ]; diplopia [   ]; vision changes [  ];  Amaurosis fugax[  ]; Resp: cough [  y];  wheezing[y  ];  hemoptysis[  ];  PND [  ];  GI:  gallstones[  ], vomiting[  ];  dysphagia[  ]; melena[  ];  hematochezia [  ]; heartburn[  ];   GU: kidney stones [  ]; hematuria[  ];   dysuria [  ];  nocturia[  ]; incontinence [  ];             Skin: rash, swelling[  ];, hair loss[  ];  peripheral edema[  ];  or itching[  ]; Musculosketetal: myalgias[  ];  joint swelling[  ];  joint erythema[  ];  joint pain[ y ];  back pain[  ];  Heme/Lymph: bruising[  ];  bleeding[  ];  anemia[  ];  Neuro: TIA[  ];  headaches[  ];  stroke[  ];  vertigo[  ];  seizures[  ];   paresthesias[  ];  difficulty walking[  ];  Psych:depression[  ]; anxiety[  ];  Endocrine: diabetes[  ];  thyroid dysfunction[  ];  Other:  Past Medical History    Diagnosis Date  . Iron deficiency anemia, unspecified   . Chronic diastolic CHF (congestive heart failure)   . CAD  S/P CABG x 3 08/2011     LIMA to diagonal branch, SVG to OM1, SVG to PDA, EVH via bilateral thighs  . Type II or unspecified type diabetes mellitus without mention of complication, not stated as uncontrolled   . Contact dermatitis and other eczema, due to unspecified cause   . Esophageal reflux   . Headache(784.0)   . Pacemaker MDT  dual chamber     DOI 08/2011  . Pure hypercholesterolemia   . hypertension   . Hypothyroidism   . Atrioventricular block, complete -intermittent   . Memory loss   . Idiopathic peripheral neuropathy   . LBBB (left bundle branch block)   . Carotid stenosis     Carotid dopplers [id number] with [id number] LICA stenosis  . Severe aortic stenosis -S/P AVR      19 mm [facility] Ease pericardial tissue valve  . Extrinsic asthma, unspecified     hasn't used inhaler in past year  . CKD (chronic kidney disease) stage 4, GFR 15-29 ml/min     Cr 1.6 in 6/11, sees Dr. Arty Baumgartner  . Impaired vision     pt. reports that she identifies her meds by looking at the pillls, she is not able to read labels on bottles    Medications Prior to Admission  Medication Sig Dispense Refill  . amLODipine (NORVASC) 10 MG tablet Take 10 mg by mouth daily.       Marland Kitchen atorvastatin (LIPITOR) 40 MG tablet Take 40 mg by mouth daily.      . carvedilol (COREG) 12.5 MG tablet Take 18.75 mg by mouth 2 (two) times daily with a meal.      . Cholecalciferol (VITAMIN D-3) 1000 UNITS CAPS Take 1 capsule by mouth daily.      . fluticasone (FLONASE) 50 MCG/ACT nasal spray Place 1 spray into both nostrils daily.      . furosemide (LASIX) 20 MG tablet Take 20 mg by mouth every other day.      Marland Kitchen glipiZIDE (GLUCOTROL XL) 10 MG 24 hr tablet Take 10 mg by mouth 2 (two) times daily.      Marland Kitchen levothyroxine (SYNTHROID, LEVOTHROID) 50 MCG tablet Take 50 mcg by mouth daily before breakfast.      . lisinopril  (PRINIVIL,ZESTRIL) 5 MG tablet Take 5 mg by mouth daily.      Marland Kitchen omeprazole (PRILOSEC) 20 MG capsule Take 20 mg by mouth daily.      Marland Kitchen tolterodine (DETROL LA) 2 MG 24 hr capsule Take 1 capsule (2 mg total) by mouth daily.  30 capsule  5     . amLODipine  10 mg Oral Daily  . [START ON 08/10/2013] aspirin EC  81 mg Oral Daily  . atorvastatin  40 mg Oral q1800  . carvedilol  18.75 mg Oral BID WC  . fesoterodine  8 mg Oral Daily  . fluticasone  1 spray Each Nare Daily  . [START ON 08/10/2013] furosemide  40 mg Intravenous Daily  . glipiZIDE  10 mg Oral BID WC  . heparin  5,000 Units Subcutaneous 3 times per day  . insulin aspart  0-5 Units Subcutaneous QHS  . insulin aspart  0-9 Units Subcutaneous TID WC  . levofloxacin  750 mg Oral Once   Followed by  . [START ON 08/11/2013] levofloxacin  500 mg Oral Q48H  . [START ON 08/10/2013] levothyroxine  50 mcg Oral QAC breakfast  . lisinopril  5 mg Oral Daily  . nitroGLYCERIN  0.5 inch Topical 4 times per day  . pantoprazole  40 mg Oral Daily  . sodium chloride  3 mL Intravenous Q12H    Infusions:    Allergies  Allergen Reactions  . Metformin     REACTION: intolerant  . Promethazine Hcl     REACTION: u/k    History   Social History  . Marital Status: Widowed  Spouse Name: N/A    Number of Children: N/A  . Years of Education: N/A   Occupational History  . Not on file.   Social History Main Topics  . Smoking status: Never Smoker   . Smokeless tobacco: Not on file  . Alcohol Use: No  . Drug Use: No  . Sexual Activity: No   Other Topics Concern  . Not on file   Social History Narrative   Widowed.  Lives with son in [place].             Family History  Problem Relation Age of Onset  . Stroke Father     died in his 61's  . Stroke Mother     died in her 65's    PHYSICAL EXAM: Filed Vitals:   08/09/13 1228  BP: 124/54  Pulse: 86  Temp: 98.4 F (36.9 C)  Resp: 20    No intake or output data in the 24  hours ending 08/09/13 1511  General:  Elderly lying flat in bed  No respiratory difficulty HEENT: normal Neck: supple. JVP 7-8 Carotids 2+ bilat; no bruits. No lymphadenopathy or thryomegaly appreciated. Cor: PMI nondisplaced. Regular rate & rhythm. 2/6 SEM RSB. Lungs: minimal crackles and mild wheezing Abdomen: soft, nontender, mildly distended. No hepatosplenomegaly. No bruits or masses. Good bowel sounds. Extremities: no cyanosis, clubbing, rash, edema Neuro: alert & oriented, cranial nerves grossly intact. moves all 4 extremities w/o difficulty. Affect pleasant.  ECG: NSR 85 with LBBB   Results for orders placed during the hospital encounter of 08/09/13 (from the past 24 hour(s))  CBC WITH DIFFERENTIAL     Status: Abnormal   Collection Time    08/09/13  8:50 AM      Result Value Ref Range   WBC 15.5 (*) 4.0 - 10.5 K/uL   RBC 2.88 (*) 3.87 - 5.11 MIL/uL   Hemoglobin 9.5 (*) 12.0 - 15.0 g/dL   HCT 27.7 (*) 36.0 - 46.0 %   MCV 96.2  78.0 - 100.0 fL   MCH 33.0  26.0 - 34.0 pg   MCHC 34.3  30.0 - 36.0 g/dL   RDW 12.6  11.5 - 15.5 %   Platelets 194  150 - 400 K/uL   Neutrophils Relative % 80 (*) 43 - 77 %   Neutro Abs 12.4 (*) 1.7 - 7.7 K/uL   Lymphocytes Relative 10 (*) 12 - 46 %   Lymphs Abs 1.5  0.7 - 4.0 K/uL   Monocytes Relative 10  3 - 12 %   Monocytes Absolute 1.5 (*) 0.1 - 1.0 K/uL   Eosinophils Relative 0  0 - 5 %   Eosinophils Absolute 0.0  0.0 - 0.7 K/uL   Basophils Relative 0  0 - 1 %   Basophils Absolute 0.0  0.0 - 0.1 K/uL  COMPREHENSIVE METABOLIC PANEL     Status: Abnormal   Collection Time    08/09/13  8:50 AM      Result Value Ref Range   Sodium 139  137 - 147 mEq/L   Potassium 4.0  3.7 - 5.3 mEq/L   Chloride 104  96 - 112 mEq/L   CO2 20  19 - 32 mEq/L   Glucose, Bld 148 (*) 70 - 99 mg/dL   BUN 38 (*) 6 - 23 mg/dL   Creatinine, Ser 1.50 (*) 0.50 - 1.10 mg/dL   Calcium 8.5  8.4 - 10.5 mg/dL   Total Protein 7.4  6.0 - 8.3 g/dL  Albumin 3.4 (*) 3.5 - 5.2  g/dL   AST 17  0 - 37 U/L   ALT 9  0 - 35 U/L   Alkaline Phosphatase 108  39 - 117 U/L   Total Bilirubin 0.7  0.3 - 1.2 mg/dL   GFR calc non Af Amer 32 (*) >90 mL/min   GFR calc Af Amer 38 (*) >90 mL/min  PRO B NATRIURETIC PEPTIDE     Status: Abnormal   Collection Time    08/09/13  8:50 AM      Result Value Ref Range   Pro B Natriuretic peptide (BNP) 7547.0 (*) 0 - 450 pg/mL  I-STAT TROPOININ, ED     Status: Abnormal   Collection Time    08/09/13  8:59 AM      Result Value Ref Range   Troponin i, poc 0.11 (*) 0.00 - 0.08 ng/mL   Comment NOTIFIED PHYSICIAN     Comment 3           POC OCCULT BLOOD, ED     Status: None   Collection Time    08/09/13  9:44 AM      Result Value Ref Range   Fecal Occult Bld NEGATIVE  NEGATIVE  RETICULOCYTES     Status: Abnormal   Collection Time    08/09/13 11:15 AM      Result Value Ref Range   Retic Ct Pct 1.8  0.4 - 3.1 %   RBC. 2.88 (*) 3.87 - 5.11 MIL/uL   Retic Count, Manual 51.8  19.0 - 186.0 K/uL  TROPONIN I     Status: None   Collection Time    08/09/13 11:15 AM      Result Value Ref Range   Troponin I <0.30  <0.30 ng/mL  URINALYSIS, ROUTINE W REFLEX MICROSCOPIC     Status: Abnormal   Collection Time    08/09/13 11:37 AM      Result Value Ref Range   Color, Urine YELLOW  YELLOW   APPearance CLEAR  CLEAR   Specific Gravity, Urine 1.014  1.005 - 1.030   pH 5.0  5.0 - 8.0   Glucose, UA NEGATIVE  NEGATIVE mg/dL   Hgb urine dipstick TRACE (*) NEGATIVE   Bilirubin Urine NEGATIVE  NEGATIVE   Ketones, ur NEGATIVE  NEGATIVE mg/dL   Protein, ur NEGATIVE  NEGATIVE mg/dL   Urobilinogen, UA 0.2  0.0 - 1.0 mg/dL   Nitrite NEGATIVE  NEGATIVE   Leukocytes, UA SMALL (*) NEGATIVE  URINE MICROSCOPIC-ADD ON     Status: Abnormal   Collection Time    08/09/13 11:37 AM      Result Value Ref Range   Squamous Epithelial / LPF FEW (*) RARE   WBC, UA 7-10  <3 WBC/hpf   RBC / HPF 0-2  <3 RBC/hpf   Bacteria, UA MANY (*) RARE  CBC     Status: Abnormal    Collection Time    08/09/13  1:50 PM      Result Value Ref Range   WBC 13.8 (*) 4.0 - 10.5 K/uL   RBC 2.71 (*) 3.87 - 5.11 MIL/uL   Hemoglobin 9.0 (*) 12.0 - 15.0 g/dL   HCT 26.0 (*) 36.0 - 46.0 %   MCV 95.9  78.0 - 100.0 fL   MCH 33.2  26.0 - 34.0 pg   MCHC 34.6  30.0 - 36.0 g/dL   RDW 12.9  11.5 - 15.5 %   Platelets 188  150 - 400  K/uL  CREATININE, SERUM     Status: Abnormal   Collection Time    08/09/13  1:50 PM      Result Value Ref Range   Creatinine, Ser 1.52 (*) 0.50 - 1.10 mg/dL   GFR calc non Af Amer 32 (*) >90 mL/min   GFR calc Af Amer 37 (*) >90 mL/min   Dg Chest 2 View  08/09/2013   CLINICAL DATA:  Worsening shortness of breath  EXAM: CHEST - 2 VIEW  COMPARISON:  12/11/2011  FINDINGS: Previous CABG and AVR. Left subclavian pacemaker stable. Mild cardiomegaly. Probable moderate hiatal hernia. Atheromatous aorta. Mild perihilar and bibasilar interstitial edema with septal lines seen peripherally in both lung bases. No effusion. Marland Kitchen Spondylitic changes in the visualized upper lumbar spine.  IMPRESSION: 1. Mild interstitial edema, new since prior study. 2. Hiatal hernia. 3. Stable chronic and postoperative changes.   Electronically Signed   By: Arne Cleveland M.D.   On: 08/09/2013 09:34     ASSESSMENT: 1. A/c diastolic HF 2. Acute resp failure due to #1 3. Aortic stenosis    --s/p bioprosthetic AVR 6/13 with CABG 4. CAD s/p CABG 6/13 5. CHB s/p PM 6. LBBB 7. CKD, stage IV -stable   PLAN/DISCUSSION:  She is mildly volume overloaded. Agree with IV lasix. Troponin currently negative. Doubt ischemia is playing a major role here. She states she drinks multiple Diet Pepsis at home so may be due to dietary noncompliance. Repeat echo has been ordered and we will review later today. Given leukocytosis and wheezing may also have component of bronchitis. Primary team has started levofloxacin and steroids.  Hopefully should be able to go home soon.   Shaune Pascal  Abby Stines,MD 2:38 PM

## 2013-08-09 NOTE — ED Notes (Signed)
Per EMS- pt has had SOB that has been intermittent over the last 2 days. Pt reports incresed SOB with walking. Pt had wheezing with EMS adn received 5mg  albuterol. States that SOB is better. Pt also has a dry cough.

## 2013-08-10 DIAGNOSIS — I2510 Atherosclerotic heart disease of native coronary artery without angina pectoris: Secondary | ICD-10-CM

## 2013-08-10 DIAGNOSIS — I252 Old myocardial infarction: Secondary | ICD-10-CM

## 2013-08-10 DIAGNOSIS — J9600 Acute respiratory failure, unspecified whether with hypoxia or hypercapnia: Secondary | ICD-10-CM

## 2013-08-10 DIAGNOSIS — Z950 Presence of cardiac pacemaker: Secondary | ICD-10-CM

## 2013-08-10 DIAGNOSIS — I455 Other specified heart block: Secondary | ICD-10-CM

## 2013-08-10 DIAGNOSIS — D509 Iron deficiency anemia, unspecified: Secondary | ICD-10-CM

## 2013-08-10 DIAGNOSIS — M549 Dorsalgia, unspecified: Secondary | ICD-10-CM

## 2013-08-10 DIAGNOSIS — J45901 Unspecified asthma with (acute) exacerbation: Secondary | ICD-10-CM

## 2013-08-10 LAB — URINALYSIS, ROUTINE W REFLEX MICROSCOPIC
Bilirubin Urine: NEGATIVE
Bilirubin Urine: NEGATIVE
GLUCOSE, UA: NEGATIVE mg/dL
Glucose, UA: 100 mg/dL — AB
HGB URINE DIPSTICK: NEGATIVE
Hgb urine dipstick: NEGATIVE
KETONES UR: NEGATIVE mg/dL
Ketones, ur: NEGATIVE mg/dL
Nitrite: NEGATIVE
Nitrite: NEGATIVE
Protein, ur: NEGATIVE mg/dL
Protein, ur: NEGATIVE mg/dL
SPECIFIC GRAVITY, URINE: 1.016 (ref 1.005–1.030)
Specific Gravity, Urine: 1.022 (ref 1.005–1.030)
Urobilinogen, UA: 0.2 mg/dL (ref 0.0–1.0)
Urobilinogen, UA: 1 mg/dL (ref 0.0–1.0)
pH: 5 (ref 5.0–8.0)
pH: 5 (ref 5.0–8.0)

## 2013-08-10 LAB — URINE MICROSCOPIC-ADD ON

## 2013-08-10 LAB — GLUCOSE, CAPILLARY
GLUCOSE-CAPILLARY: 34 mg/dL — AB (ref 70–99)
GLUCOSE-CAPILLARY: 139 mg/dL — AB (ref 70–99)
GLUCOSE-CAPILLARY: 203 mg/dL — AB (ref 70–99)
GLUCOSE-CAPILLARY: 110 mg/dL — AB (ref 70–99)
GLUCOSE-CAPILLARY: 67 mg/dL — AB (ref 70–99)
Glucose-Capillary: 205 mg/dL — ABNORMAL HIGH (ref 70–99)
Glucose-Capillary: 61 mg/dL — ABNORMAL LOW (ref 70–99)

## 2013-08-10 LAB — PREPARE RBC (CROSSMATCH)

## 2013-08-10 LAB — SODIUM, URINE, RANDOM: Sodium, Ur: <20 mEq/L

## 2013-08-10 LAB — CBC
HEMATOCRIT: 22.6 % — AB (ref 36.0–46.0)
Hemoglobin: 7.7 g/dL — ABNORMAL LOW (ref 12.0–15.0)
MCH: 32.6 pg (ref 26.0–34.0)
MCHC: 34.1 g/dL (ref 30.0–36.0)
MCV: 95.8 fL (ref 78.0–100.0)
PLATELETS: 176 10*3/uL (ref 150–400)
RBC: 2.36 MIL/uL — ABNORMAL LOW (ref 3.87–5.11)
RDW: 12.6 % (ref 11.5–15.5)
WBC: 10.2 10*3/uL (ref 4.0–10.5)

## 2013-08-10 LAB — TROPONIN I: Troponin I: <0.3 ng/mL (ref <0.30)

## 2013-08-10 LAB — BASIC METABOLIC PANEL
BUN: 62 mg/dL — ABNORMAL HIGH (ref 6–23)
CHLORIDE: 99 meq/L (ref 96–112)
CO2: 20 mEq/L (ref 19–32)
CREATININE: 2.08 mg/dL — AB (ref 0.50–1.10)
Calcium: 8.2 mg/dL — ABNORMAL LOW (ref 8.4–10.5)
GFR, EST AFRICAN AMERICAN: 25 mL/min — AB (ref >90)
GFR, EST NON AFRICAN AMERICAN: 22 mL/min — AB (ref >90)
Glucose, Bld: 286 mg/dL — ABNORMAL HIGH (ref 70–99)
Potassium: 4.2 mEq/L (ref 3.7–5.3)
Sodium: 133 mEq/L — ABNORMAL LOW (ref 137–147)

## 2013-08-10 LAB — CREATININE, URINE, RANDOM: Creatinine, Urine: 135.58 mg/dL

## 2013-08-10 LAB — OSMOLALITY, URINE: OSMOLALITY UR: 336 mosm/kg — AB (ref 390–1090)

## 2013-08-10 MED ORDER — GLUCOSE-VITAMIN C 4-6 GM-MG PO CHEW
4.0000 | CHEWABLE_TABLET | ORAL | Status: DC | PRN
  Administered 2013-08-10: 2 via ORAL

## 2013-08-10 MED ORDER — ASPIRIN EC 81 MG PO TBEC
81.0000 mg | DELAYED_RELEASE_TABLET | Freq: Every day | ORAL | Status: DC
  Administered 2013-08-10 – 2013-08-12 (×3): 81 mg via ORAL
  Filled 2013-08-10 (×3): qty 1

## 2013-08-10 MED ORDER — GLUCOSE-VITAMIN C 4-6 GM-MG PO CHEW
CHEWABLE_TABLET | ORAL | Status: AC
  Filled 2013-08-10: qty 1

## 2013-08-10 MED ORDER — ALBUTEROL SULFATE (2.5 MG/3ML) 0.083% IN NEBU: 2.5000 mg | INHALATION_SOLUTION | Freq: Four times a day (QID) | RESPIRATORY_TRACT | Status: DC | PRN

## 2013-08-10 MED ORDER — FUROSEMIDE 10 MG/ML IJ SOLN
20.0000 mg | Freq: Once | INTRAMUSCULAR | Status: AC
  Administered 2013-08-10: 20 mg via INTRAVENOUS

## 2013-08-10 NOTE — Progress Notes (Signed)
Patient ID: Barbara Garner  female  [id number]    DOB: 05/31/1934    DOA: 08/09/2013  PCP: Loura Pardon, MD  Assessment/Plan: Principal Problem:   Acute and chronic respiratory failure; improved A) likely due to acute and chronic diastolic CHF  - much improved after IV Lasix, continue beta blocker, aspirin - Cardiology following, 2-D echo done EF [id number], grade 2 diastolic dysfunction, bioprosthetic aortic valve, normal range of motion -  holding lisinopril due to worsening creatinine today  B) mild acute bronchitis: Wheezing improved - Continue levofloxacin, bronchodilators as needed  Elevated troponin, point of care: - Subsequent troponins negative, no chest pain, old left bundle-branch on EKG, - Cardiology consulted, EF preserved at 60-65%    HYPOTHYROIDISM - Continue Synthroid  History of bovine aortic valve - no acute issues, echo repeated.   History of complete heart block status post placement of placement. No acute issues   anemia: Patient has known history of iron deficiency anemia, stool occult test 5/23 negative, hemoglobin dropped down to 7.7 today - Baseline hemoglobin unknown, will transfuse 2 units of packed RBCs today    DIABETES MELLITUS, TYPE II with hypoglycemia this morning - Hold oral hypoglycemics, only continue sliding scale insulin    GERD -Continue PPI     CKD (chronic kidney disease) stage 4, GFR 15-29 ml/min - Creatinine baseline 1.4-1.5, worsened to 2.0 today, likely due to IV diuresis and lisinopril, hold lisinopril. - Will hold IV Lasix until evaluated by cardiology    Atrial fibrillation - Currently in normal sinus rhythm  Mild leukocytosis, likely due to acute bronchitis - Continue Levaquin, follow urine culture and sensitivities  DVT Prophylaxis:Heparin discontinued, placed on SCDs  Code Status:  Family Communication:Patient alert and oriented, discussed with the  patient  Disposition:  Consultants:  Cardiology  Procedures:  None  Antibiotics:  Levaquin 5/23    Subjective: Patient seen and examined, no specific complaints  Objective: Weight change:   Intake/Output Summary (Last 24 hours) at 08/10/13 1009 Last data filed at 08/10/13 0600  Gross per 24 hour  Intake    240 ml  Output    400 ml  Net   -160 ml   Blood pressure 101/53, pulse 72, temperature 97.4 F (36.3 C), temperature source Oral, resp. rate 15, height 5\' 2"  (1.575 m), weight 53.388 kg (117 lb 11.2 oz), SpO2 94.00%.  Physical Exam: General: Alert and awake, oriented, not in any acute distress. CVS: S1-S2 clear, no murmur rubs or gallops Chest:No significant wheezing no rhonchi or rales  Abdomen: soft nontender, nondistended, normal bowel sounds  Extremities: no cyanosis, clubbing or edema noted bilaterally Neuro: Cranial nerves II-XII intact, no focal neurological deficits  Lab Results: Basic Metabolic Panel:  Recent Labs Lab 08/09/13 0850 08/09/13 1350 08/10/13 0019  NA 139  --  133*  K 4.0  --  4.2  CL 104  --  99  CO2 20  --  20  GLUCOSE 148*  --  286*  BUN 38*  --  62*  CREATININE 1.50* 1.52* 2.08*  CALCIUM 8.5  --  8.2*   Liver Function Tests:  Recent Labs Lab 08/09/13 0850  AST 17  ALT 9  ALKPHOS 108  BILITOT 0.7  PROT 7.4  ALBUMIN 3.4*   No results found for this basename: LIPASE, AMYLASE,  in the last 168 hours No results found for this basename: AMMONIA,  in the last 168 hours CBC:  Recent Labs Lab 08/09/13 0850 08/09/13 1350 08/10/13 0019  WBC 15.5* 13.8* 10.2  NEUTROABS 12.4*  --   --   HGB 9.5* 9.0* 7.7*  HCT 27.7* 26.0* 22.6*  MCV 96.2 95.9 95.8  PLT 194 188 176   Cardiac Enzymes:  Recent Labs Lab 08/09/13 1115 08/09/13 1900 08/10/13 0019  TROPONINI <0.30 <0.30 <0.30   BNP: No components found with this basename: POCBNP,  CBG:  Recent Labs Lab 08/09/13 2054 08/10/13 0731 08/10/13 0841  GLUCAP 381*  34* 139*     Micro Results: No results found for this or any previous visit (from the past 240 hour(s)).  Studies/Results: Dg Chest 2 View  08/09/2013   CLINICAL DATA:  Worsening shortness of breath  EXAM: CHEST - 2 VIEW  COMPARISON:  12/11/2011  FINDINGS: Previous CABG and AVR. Left subclavian pacemaker stable. Mild cardiomegaly. Probable moderate hiatal hernia. Atheromatous aorta. Mild perihilar and bibasilar interstitial edema with septal lines seen peripherally in both lung bases. No effusion. Marland Kitchen Spondylitic changes in the visualized upper lumbar spine.  IMPRESSION: 1. Mild interstitial edema, new since prior study. 2. Hiatal hernia. 3. Stable chronic and postoperative changes.   Electronically Signed   By: Arne Cleveland M.D.   On: 08/09/2013 09:34    Medications: Scheduled Meds: . amLODipine  10 mg Oral Daily  . aspirin EC  81 mg Oral Daily  . atorvastatin  40 mg Oral q1800  . carvedilol  18.75 mg Oral BID WC  . fesoterodine  8 mg Oral Daily  . fluticasone  1 spray Each Nare Daily  . furosemide  40 mg Intravenous Daily  . insulin aspart  0-5 Units Subcutaneous QHS  . insulin aspart  0-9 Units Subcutaneous TID WC  . [START ON 08/11/2013] levofloxacin  500 mg Oral Q48H  . levothyroxine  50 mcg Oral QAC breakfast  . lisinopril  5 mg Oral Daily  . nitroGLYCERIN  0.5 inch Topical 4 times per day  . pantoprazole  40 mg Oral Daily  . pneumococcal 23 valent vaccine  0.5 mL Intramuscular Tomorrow-1000  . sodium chloride  3 mL Intravenous Q12H      LOS: 1 day   King Pinzon Krystal Eaton M.D. [facility] 08/10/2013, 10:09 AM Pager: [phone]  If 7PM-7AM, please contact night-coverage [email or web address] Password [email or web address]  **Disclaimer: This note was dictated with voice recognition software. Similar sounding words can inadvertently be transcribed and this note may contain transcription errors which may not have been corrected upon publication of note.**

## 2013-08-10 NOTE — Progress Notes (Signed)
Hypoglycemic Event  CBG: 61  Treatment: juice and crackers/ 2 gluc tabs  Symptoms: none  Follow-up CBG: Time: 2150 CBG Result:67    2215           110   Possible Reasons for Event: dm, poor oral intake   Barbara Garner  Remember to initiate Hypoglycemia Order Set & complete

## 2013-08-10 NOTE — Progress Notes (Signed)
Pt's Hgb 7.7 / Hct 22.6 this  Am.  Kathline Magic, NP notified via text page.

## 2013-08-10 NOTE — Progress Notes (Signed)
Consulting cardiologist: Glori Bickers MD Primary Cardiologist: Loralie Champagne MD EP: Virl Axe MD  Subjective:    Breathing better. Wants to go home. No complaints of chest pain.  Objective:   Temp:  [97.4 F (36.3 C)-98.4 F (36.9 C)] 97.4 F (36.3 C) (05/24 0521) Pulse Rate:  [70-89] 72 (05/24 0521) Resp:  [15-25] 15 (05/24 0521) BP: (101-137)/(47-80) 101/53 mmHg (05/24 0521) SpO2:  [93 %-97 %] 94 % (05/24 0521) Weight:  [113 lb 4 oz (51.37 kg)-117 lb 11.2 oz (53.388 kg)] 117 lb 11.2 oz (53.388 kg) (05/24 0521) Last BM Date: 08/08/13  Filed Weights   08/09/13 1236 08/10/13 0521  Weight: 113 lb 4 oz (51.37 kg) 117 lb 11.2 oz (53.388 kg)    Intake/Output Summary (Last 24 hours) at 08/10/13 1006 Last data filed at 08/10/13 0600  Gross per 24 hour  Intake    240 ml  Output    400 ml  Net   -160 ml   Echocardiogram  08/09/2013 Left ventricle: The cavity size was normal. Wall thickness was normal. Systolic function was normal. The estimated ejection fraction was in the range of 60% to 65%. Features are consistent with a pseudonormal left ventricular filling pattern, with concomitant abnormal relaxation and increased filling pressure (grade 2 diastolic dysfunction). - Aortic valve: A bioprosthesis was present. The prosthesis had a normal range of motion. Valve area (VTI): 1.27 cm^2. Valve area (Vmax): 1.18 cm^2. - Mitral valve: Calcified annulus. Moderately thickened leaflets . The findings are consistent with mild stenosis. There was mild to moderate regurgitation. Valve area by continuity equation (using LVOT flow): 1.63 cm^2. - Left atrium: The atrium was moderately dilated.  Telemetry: SR with LBBB.   Exam:  General: No acute distress.  HEENT: Conjunctiva and lids normal, oropharynx clear.  Lungs: Some mild crackles in the bases. No wheezes.   Cardiac: No elevated JVP or bruits. RRR, no gallop or rub.   Abdomen: Normoactive bowel sounds,  nontender, nondistended.  Extremities: No pitting edema, distal diminished.  Neuropsychiatric: Alert and oriented x3, affect appropriate.Hard of hearing.   Lab Results:  Basic Metabolic Panel:  Recent Labs Lab 08/09/13 0850 08/09/13 1350 08/10/13 0019  NA 139  --  133*  K 4.0  --  4.2  CL 104  --  99  CO2 20  --  20  GLUCOSE 148*  --  286*  BUN 38*  --  62*  CREATININE 1.50* 1.52* 2.08*  CALCIUM 8.5  --  8.2*    Liver Function Tests:  Recent Labs Lab 08/09/13 0850  AST 17  ALT 9  ALKPHOS 108  BILITOT 0.7  PROT 7.4  ALBUMIN 3.4*    CBC:  Recent Labs Lab 08/09/13 0850 08/09/13 1350 08/10/13 0019  WBC 15.5* 13.8* 10.2  HGB 9.5* 9.0* 7.7*  HCT 27.7* 26.0* 22.6*  MCV 96.2 95.9 95.8  PLT 194 188 176    Cardiac Enzymes:  Recent Labs Lab 08/09/13 1115 08/09/13 1900 08/10/13 0019  TROPONINI <0.30 <0.30 <0.30    BNP:  Recent Labs  08/09/13 0850  PROBNP 7547.0*    Radiology: Dg Chest 2 View  08/09/2013   CLINICAL DATA:  Worsening shortness of breath  EXAM: CHEST - 2 VIEW  COMPARISON:  12/11/2011  FINDINGS: Previous CABG and AVR. Left subclavian pacemaker stable. Mild cardiomegaly. Probable moderate hiatal hernia. Atheromatous aorta. Mild perihilar and bibasilar interstitial edema with septal lines seen peripherally in both lung bases. No effusion. Marland Kitchen Spondylitic changes in  the visualized upper lumbar spine.  IMPRESSION: 1. Mild interstitial edema, new since prior study. 2. Hiatal hernia. 3. Stable chronic and postoperative changes.   Electronically Signed   By: Arne Cleveland M.D.   On: 08/09/2013 09:34     ECG: SR with LBBB   Medications:   Scheduled Medications: . amLODipine  10 mg Oral Daily  . aspirin EC  81 mg Oral Daily  . atorvastatin  40 mg Oral q1800  . carvedilol  18.75 mg Oral BID WC  . fesoterodine  8 mg Oral Daily  . fluticasone  1 spray Each Nare Daily  . furosemide  40 mg Intravenous Daily  . glipiZIDE  10 mg Oral BID WC  .  heparin  5,000 Units Subcutaneous 3 times per day  . insulin aspart  0-5 Units Subcutaneous QHS  . insulin aspart  0-9 Units Subcutaneous TID WC  . [START ON 08/11/2013] levofloxacin  500 mg Oral Q48H  . levothyroxine  50 mcg Oral QAC breakfast  . lisinopril  5 mg Oral Daily  . nitroGLYCERIN  0.5 inch Topical 4 times per day  . pantoprazole  40 mg Oral Daily  . pneumococcal 23 valent vaccine  0.5 mL Intramuscular Tomorrow-1000  . sodium chloride  3 mL Intravenous Q12H       PRN Medications: alum & mag hydroxide-simeth, guaiFENesin-dextromethorphan, HYDROcodone-acetaminophen, ondansetron (ZOFRAN) IV, ondansetron, polyethylene glycol   Assessment and Plan:   1.Anemia: Hgb has decreased from 9.0 to 7.7 with diureses. Consider this as reason for acute dyspnea and mild CHF. She is not on anticoagulation therapy.  Review of history demonstrates that she has iron deficiency anemia but on review of home medications I do not see that she is on iron replacement. Hemoccult stools. Recommend further work-up with hematology or GI if hemoccult positive. Will type and cross for PRBC;s in case transfusion is needed. Iron profile is reviewed. Iron level is low at 15, TIBC low at 178, Ferritin 527. Will defer to PTH concerning further work up.  2. .CAD with hx of CABG in 08/2011:LHC (5/13) showed 95% OM1 stenosis, 75% mLAD, 80% ostial large D1, 75% dRCA. She had LIMA-LAD, SVG-PDA, and SVG-OM at time of AVR in 6/13.    She denies frank chest pain. Troponin negative X 3 arguing against ACS as cause of shortness of breath. Continue coreg and ASA for now unless GI bleed prohibits ASA. She does not endorse tarry stools or active bleeding.   3. Acute on chronic diastolic CHF: Likely related to #1. She has diuresed some but minimal since admission. Wt does not reflect diureses. Creatinine 2.08 up from 1.53 on admission. Will d/c IV lasix and return to po home dose of 20 mg daily. Echo demonstrated normal LV fx with  grade 2 diastolic dysfunction.  4. Bio Prosthetic AoV: 08/2011 with CABG: Echo states that valve is functioning normally.  5. CHB: S/P Medtronic Dual chamber 2013. Last check 05/12/2013. Follow appt to be interrogated in August of 2015.    Phill Myron. Lawrence NP  08/10/2013, 10:06 AM  Agree with note by Jory Sims PA-C  Pt admitted with CHF. H/O CABG/AVR as well as PTVPM. Not on coumadin. Minimal diuresis on iv lasix however SCr has increased from 1.5---> 2.0. In addition , her HGB has decreased. Agree with holding diuretic and probably transfusing 2 U PRBCs. Will need GI eval but will defer to primary team. 2D echo shows nl LV fxn and well functioning prosthesis. Will follow along with  you.  Lorretta Harp, M.D., [place], [facility], FAHA, [place] [address] [place], [place]  [address]  [phone] 08/10/2013 10:47 AM

## 2013-08-11 LAB — TYPE AND SCREEN
ABO/RH(D): B POS
Antibody Screen: NEGATIVE
Unit division: 0
Unit division: 0

## 2013-08-11 LAB — CBC
HCT: 31.3 % — ABNORMAL LOW (ref 36.0–46.0)
Hemoglobin: 10.6 g/dL — ABNORMAL LOW (ref 12.0–15.0)
MCH: 29.4 pg (ref 26.0–34.0)
MCHC: 33.9 g/dL (ref 30.0–36.0)
MCV: 86.9 fL (ref 78.0–100.0)
PLATELETS: 187 10*3/uL (ref 150–400)
RBC: 3.6 MIL/uL — AB (ref 3.87–5.11)
RDW: 18.3 % — AB (ref 11.5–15.5)
WBC: 12.6 10*3/uL — AB (ref 4.0–10.5)

## 2013-08-11 LAB — BASIC METABOLIC PANEL
BUN: 81 mg/dL — ABNORMAL HIGH (ref 6–23)
CALCIUM: 8.4 mg/dL (ref 8.4–10.5)
CO2: 20 meq/L (ref 19–32)
Chloride: 104 mEq/L (ref 96–112)
Creatinine, Ser: 2.21 mg/dL — ABNORMAL HIGH (ref 0.50–1.10)
GFR calc non Af Amer: 20 mL/min — ABNORMAL LOW (ref >90)
GFR, EST AFRICAN AMERICAN: 24 mL/min — AB (ref >90)
Glucose, Bld: 98 mg/dL (ref 70–99)
Potassium: 4.3 mEq/L (ref 3.7–5.3)
Sodium: 138 mEq/L (ref 137–147)

## 2013-08-11 LAB — GLUCOSE, CAPILLARY
GLUCOSE-CAPILLARY: 74 mg/dL (ref 70–99)
Glucose-Capillary: 131 mg/dL — ABNORMAL HIGH (ref 70–99)
Glucose-Capillary: 136 mg/dL — ABNORMAL HIGH (ref 70–99)
Glucose-Capillary: 130 mg/dL — ABNORMAL HIGH (ref 70–99)

## 2013-08-11 LAB — URINE CULTURE

## 2013-08-11 MED ORDER — DEXTROSE 5 % IV SOLN: 1.0000 g | INTRAVENOUS | Status: DC

## 2013-08-11 MED ORDER — SODIUM CHLORIDE 0.9 % IV SOLN: INTRAVENOUS | Status: DC

## 2013-08-11 MED ORDER — NITROGLYCERIN 2 % TD OINT
0.5000 [in_us] | TOPICAL_OINTMENT | Freq: Four times a day (QID) | TRANSDERMAL | Status: DC
  Administered 2013-08-11 (×3): 0.5 [in_us] via TOPICAL
  Filled 2013-08-11: qty 30

## 2013-08-11 NOTE — Progress Notes (Signed)
Patient ID: SAPHIRE BUFF  female  [id number]    DOB: [birth year]    DOA: 08/09/2013  PCP: Loura Pardon, MD  Assessment/Plan:  Principal Problem:   Acute and chronic respiratory failure; improved A) likely due to acute and chronic diastolic CHF  - Patient had received IV Lasix, continue beta blocker, aspirin - Cardiology following, 2-D echo done EF [id number], grade 2 diastolic dysfunction, bioprosthetic aortic valve, normal range of motion -  holding lisinopril and Lasix due to worsening creatinine 2.2 today, will place on gentle hydration today, for 1 L and then reassess  B) mild acute bronchitis: Wheezing improved - Continue levofloxacin, bronchodilators as needed   ACUTE RENAL INSUFFICIENCY ON CKD (chronic kidney disease) stage 4, GFR 15-29 ml/min, likely worsened due to Lasix, lisinopril, anemia, UTI - Creatinine baseline 1.4-1.5, worsened to 2.2, despite transfusion, hold Lasix and lisinopril, placed on gentle hydration   Urinary tract infection - Urine culture showed Escherichia coli, currently on levofloxacin, will follow sensitivities  Elevated troponin, point of care: - Subsequent troponins negative, no chest pain, old left bundle-branch on EKG, - Cardiology consulted, EF preserved at 60-65%  Anemia: Patient has known history of iron deficiency anemia, stool occult test 5/23 negative, hemoglobin dropped down to 7.7 today. - Baseline hemoglobin unknown, status post 2 units of packed RBC transfusion, hemoglobin 10.6 - Patient should have outpatient GI evaluation, may need colonoscopy/endoscopy. I can set her up with GI appointment in a.m. however if hemoglobin times down or stool occult test is positive, may need inpatient workup.     HYPOTHYROIDISM - Continue Synthroid  History of bovine aortic valve - no acute issues, echo repeated.   History of complete heart block status post placement of placement. No acute issues     DIABETES MELLITUS, TYPE II with hypoglycemia  this morning - Hold oral hypoglycemics, only continue sliding scale insulin    GERD -Continue PPI    Atrial fibrillation - Currently in normal sinus rhythm  Mild leukocytosis, likely due to acute bronchitis - Continue Levaquin, follow urine culture and sensitivities  DVT Prophylaxis:Heparin discontinued, placed on SCDs  Code Status:  Family Communication:Patient alert and oriented, discussed with the patient and her son at the bedside   Disposition:  Consultants:  Cardiology  Procedures:  None  Antibiotics:  Levaquin 5/23    Subjective: Patient seen and examined, no specific complaints, wants to go home, denies any dark-colored stools or hematemesis. She thinks she had prior GI workup but does not remember any details.   Objective: Weight change: 1.293 kg (2 lb 13.6 oz)  Intake/Output Summary (Last 24 hours) at 08/11/13 1106 Last data filed at 08/11/13 0900  Gross per 24 hour  Intake 1279.5 ml  Output    600 ml  Net  679.5 ml   Blood pressure 111/66, pulse 78, temperature 98.3 F (36.8 C), temperature source Oral, resp. rate 18, height 5\' 2"  (1.575 m), weight 52.663 kg (116 lb 1.6 oz), SpO2 94.00%.  Physical Exam: General: Alert and awake, oriented, not in any acute distress. CVS: S1-S2 clear, no murmur rubs or gallops Chest:No significant wheezing no rhonchi or rales  Abdomen: soft nontender, nondistended, normal bowel sounds  Extremities: no cyanosis, clubbing or edema noted bilaterally   Lab Results: Basic Metabolic Panel:  Recent Labs Lab 08/10/13 0019 08/11/13 0335  NA 133* 138  K 4.2 4.3  CL 99 104  CO2 20 20  GLUCOSE 286* 98  BUN 62* 81*  CREATININE 2.08* 2.21*  CALCIUM 8.2* 8.4   Liver Function Tests:  Recent Labs Lab 08/09/13 0850  AST 17  ALT 9  ALKPHOS 108  BILITOT 0.7  PROT 7.4  ALBUMIN 3.4*   No results found for this basename: LIPASE, AMYLASE,  in the last 168 hours No results found for this basename: AMMONIA,  in  the last 168 hours CBC:  Recent Labs Lab 08/09/13 0850  08/10/13 0019 08/11/13 0335  WBC 15.5*  < > 10.2 12.6*  NEUTROABS 12.4*  --   --   --   HGB 9.5*  < > 7.7* 10.6*  HCT 27.7*  < > 22.6* 31.3*  MCV 96.2  < > 95.8 86.9  PLT 194  < > 176 187  < > = values in this interval not displayed. Cardiac Enzymes:  Recent Labs Lab 08/09/13 1115 08/09/13 1900 08/10/13 0019  TROPONINI <0.30 <0.30 <0.30   BNP: No components found with this basename: POCBNP,  CBG:  Recent Labs Lab 08/10/13 1608 08/10/13 2133 08/10/13 2153 08/10/13 2221 08/11/13 0751  GLUCAP 205* 61* 67* 110* 74     Micro Results: Recent Results (from the past 240 hour(s))  URINE CULTURE     Status: None   Collection Time    08/09/13 11:37 AM      Result Value Ref Range Status   Specimen Description URINE, RANDOM   Final   Special Requests NONE   Final   Culture  Setup Time     Final   Value: 08/09/2013 12:25     Performed at [facility]     Final   Value: >=100,000 COLONIES/ML     Performed at [facility]   Culture     Final   Value: ESCHERICHIA COLI     Performed at [facility]   Report Status PENDING   Incomplete  URINE CULTURE     Status: None   Collection Time    08/09/13 11:49 PM      Result Value Ref Range Status   Specimen Description URINE, CLEAN CATCH   Final   Special Requests NONE   Final   Culture  Setup Time     Final   Value: 08/10/2013 00:41     Performed at [facility]     Final   Value: >=100,000 COLONIES/ML     Performed at [facility]   Culture     Final   Value: Multiple bacterial morphotypes present, none predominant. Suggest appropriate recollection if clinically indicated.     Performed at [facility]   Report Status 08/11/2013 FINAL   Final    Studies/Results: Dg Chest 2 View  08/09/2013   CLINICAL DATA:  Worsening shortness of breath  EXAM: CHEST - 2 VIEW  COMPARISON:  12/11/2011   FINDINGS: Previous CABG and AVR. Left subclavian pacemaker stable. Mild cardiomegaly. Probable moderate hiatal hernia. Atheromatous aorta. Mild perihilar and bibasilar interstitial edema with septal lines seen peripherally in both lung bases. No effusion. Marland Kitchen Spondylitic changes in the visualized upper lumbar spine.  IMPRESSION: 1. Mild interstitial edema, new since prior study. 2. Hiatal hernia. 3. Stable chronic and postoperative changes.   Electronically Signed   By: Arne Cleveland M.D.   On: 08/09/2013 09:34    Medications: Scheduled Meds: . amLODipine  10 mg Oral Daily  . aspirin EC  81 mg Oral Daily  . atorvastatin  40 mg Oral q1800  .  carvedilol  18.75 mg Oral BID WC  . fesoterodine  8 mg Oral Daily  . fluticasone  1 spray Each Nare Daily  . insulin aspart  0-5 Units Subcutaneous QHS  . insulin aspart  0-9 Units Subcutaneous TID WC  . levofloxacin  500 mg Oral Q48H  . levothyroxine  50 mcg Oral QAC breakfast  . nitroGLYCERIN  0.5 inch Topical 4 times per day  . pantoprazole  40 mg Oral Daily  . pneumococcal 23 valent vaccine  0.5 mL Intramuscular Tomorrow-1000  . sodium chloride  3 mL Intravenous Q12H      LOS: 2 days   Ripudeep Krystal Eaton M.D. [facility] 08/11/2013, 11:06 AM Pager: [phone]  If 7PM-7AM, please contact night-coverage [email or web address] Password [email or web address]  **Disclaimer: This note was dictated with voice recognition software. Similar sounding words can inadvertently be transcribed and this note may contain transcription errors which may not have been corrected upon publication of note.**

## 2013-08-11 NOTE — Progress Notes (Signed)
UR completed. Tajay Muzzy RN CCM Case Mgmt phone [phone] 

## 2013-08-11 NOTE — Progress Notes (Signed)
Patient Name: Barbara Garner      SUBJECTIVE:admitted with sob and leukocytosis  Improved with diuresis which has complicated baseline renal insufficiency;  Also has manifested anemia (?) hbg 9.5>>7.7 was 11 5/14   tx 2 U  yesteday  Hx iof aortic valve replacement(UA neg)  Has CAD with cabg, PM for complete heart block, intermittent and PAF  Breathing ok but weak  Past Medical History  Diagnosis Date  . Iron deficiency anemia, unspecified   . Chronic diastolic CHF (congestive heart failure)   . CAD  S/P CABG x 3 08/2011     LIMA to diagonal branch, SVG to OM1, SVG to PDA, EVH via bilateral thighs  . Type II or unspecified type diabetes mellitus without mention of complication, not stated as uncontrolled   . Contact dermatitis and other eczema, due to unspecified cause   . Esophageal reflux   . Headache(784.0)   . Pacemaker MDT dual chamber     DOI 08/2011  . Pure hypercholesterolemia   . hypertension   . Hypothyroidism   . Atrioventricular block, complete -intermittent   . Memory loss   . Idiopathic peripheral neuropathy   . LBBB (left bundle branch block)   . Carotid stenosis     Carotid dopplers [id number] with [id number] LICA stenosis  . Severe aortic stenosis -S/P AVR      19 mm [facility] Ease pericardial tissue valve  . Extrinsic asthma, unspecified     hasn't used inhaler in past year  . CKD (chronic kidney disease) stage 4, GFR 15-29 ml/min     Cr 1.6 in 6/11, sees Dr. Arty Baumgartner  . Impaired vision     pt. reports that she identifies her meds by looking at the pillls, she is not able to read labels on bottles    Scheduled Meds:  Scheduled Meds: . amLODipine  10 mg Oral Daily  . aspirin EC  81 mg Oral Daily  . atorvastatin  40 mg Oral q1800  . carvedilol  18.75 mg Oral BID WC  . fesoterodine  8 mg Oral Daily  . fluticasone  1 spray Each Nare Daily  . insulin aspart  0-5 Units Subcutaneous QHS  . insulin aspart  0-9 Units Subcutaneous TID WC  . levofloxacin   500 mg Oral Q48H  . levothyroxine  50 mcg Oral QAC breakfast  . nitroGLYCERIN  0.5 inch Topical 4 times per day  . pantoprazole  40 mg Oral Daily  . pneumococcal 23 valent vaccine  0.5 mL Intramuscular Tomorrow-1000  . sodium chloride  3 mL Intravenous Q12H   Continuous Infusions:   PHYSICAL EXAM Filed Vitals:   08/10/13 2300 08/10/13 2354 08/11/13 0526 08/11/13 0641  BP: 104/52 94/48 111/66   Pulse: 68 66 78   Temp: 97.9 F (36.6 C) 97.5 F (36.4 C) 98.3 F (36.8 C)   TempSrc: Oral Oral Oral   Resp: 20 18 18    Height:      Weight:    116 lb 1.6 oz (52.663 kg)  SpO2: 96% 97% 94%    Well developed and nourished in no acute distress HENT normal Neck supple with JVP-flat Clear Regular rate and rhythm, no murmurs or gallops Abd-soft with active BS No Clubbing cyanosis edema Skin-warm and dry A & Oriented  Grossly normal sensory and motor function       Intake/Output Summary (Last 24 hours) at 08/11/13 0930 Last data filed at 08/11/13 0526  Gross per 24  hour  Intake 1019.5 ml  Output    825 ml  Net  194.5 ml    LABS: Basic Metabolic Panel:  Recent Labs Lab 08/09/13 0850 08/09/13 1350 08/10/13 0019 08/11/13 0335  NA 139  --  133* 138  K 4.0  --  4.2 4.3  CL 104  --  99 104  CO2 20  --  20 20  GLUCOSE 148*  --  286* 98  BUN 38*  --  62* 81*  CREATININE 1.50* 1.52* 2.08* 2.21*  CALCIUM 8.5  --  8.2* 8.4   Cardiac Enzymes:  Recent Labs  08/09/13 1115 08/09/13 1900 08/10/13 0019  TROPONINI <0.30 <0.30 <0.30   CBC:  Recent Labs Lab 08/09/13 0850 08/09/13 1350 08/10/13 0019 08/11/13 0335  WBC 15.5* 13.8* 10.2 12.6*  NEUTROABS 12.4*  --   --   --   HGB 9.5* 9.0* 7.7* 10.6*  HCT 27.7* 26.0* 22.6* 31.3*  MCV 96.2 95.9 95.8 86.9  PLT 194 188 176 187   PROTIME: No results found for this basename: LABPROT, INR,  in the last 72 hours Liver Function Tests:  Recent Labs  08/09/13 0850  AST 17  ALT 9  ALKPHOS 108  BILITOT 0.7  PROT 7.4    ALBUMIN 3.4*   No results found for this basename: LIPASE, AMYLASE,  in the last 72 hours BNP: BNP (last 3 results)  Recent Labs  08/09/13 0850  PROBNP 7547.0*   D-Dimer: No results found for this basename: DDIMER,  in the last 72 hours Hemoglobin A1C:  Recent Labs  08/09/13 1350  HGBA1C 6.3*   Fasting Lipid Panel: No results found for this basename: CHOL, HDL, LDLCALC, TRIG, CHOLHDL, LDLDIRECT,  in the last 72 hours Thyroid Function Tests: No results found for this basename: TSH, T4TOTAL, FREET3, T3FREE, THYROIDAB,  in the last 72 hours Anemia Panel:  Recent Labs  08/09/13 1115  VITAMINB12 268  FOLATE 10.6  FERRITIN 527*  TIBC 178*  IRON 15*  RETICCTPCT 1.8         ASSESSMENT AND PLAN:  Principal Problem:   Acute and chronic respiratory failure Active Problems:   HYPOTHYROIDISM   DIABETES MELLITUS, TYPE II   HYPERCHOLESTEROLEMIA, PURE   ANEMIA-IRON DEFICIENCY   MYOCARDIAL INFARCTION, HX OF   CORONARY ARTERY DISEASE   GERD   IVCD (intraventricular conduction defect)   CKD (chronic kidney disease) stage 4, GFR 15-29 ml/min   S/P aortic valve replacement   S/P CABG x 3   Atrial fibrillation   Chronic diastolic CHF (congestive heart failure)   UTI (urinary tract infection) worsening renal function despite transfusion  Lisinopril and diuretics stopped May need fluids (yuk)  Will need to keep till Cr trends down  Signed, Deboraha Sprang MD  08/11/2013

## 2013-08-12 ENCOUNTER — Encounter: Payer: Self-pay | Admitting: Physician Assistant

## 2013-08-12 ENCOUNTER — Other Ambulatory Visit: Payer: Self-pay | Admitting: Physician Assistant

## 2013-08-12 DIAGNOSIS — I509 Heart failure, unspecified: Secondary | ICD-10-CM

## 2013-08-12 LAB — URINE CULTURE: Colony Count: >=100000

## 2013-08-12 LAB — BASIC METABOLIC PANEL
BUN: 62 mg/dL — ABNORMAL HIGH (ref 6–23)
CHLORIDE: 108 meq/L (ref 96–112)
CO2: 21 meq/L (ref 19–32)
Calcium: 8.5 mg/dL (ref 8.4–10.5)
Creatinine, Ser: 1.87 mg/dL — ABNORMAL HIGH (ref 0.50–1.10)
GFR calc Af Amer: 29 mL/min — ABNORMAL LOW (ref >90)
GFR calc non Af Amer: 25 mL/min — ABNORMAL LOW (ref >90)
Glucose, Bld: 122 mg/dL — ABNORMAL HIGH (ref 70–99)
POTASSIUM: 4.6 meq/L (ref 3.7–5.3)
SODIUM: 141 meq/L (ref 137–147)

## 2013-08-12 LAB — CBC
HEMATOCRIT: 33.6 % — AB (ref 36.0–46.0)
HEMOGLOBIN: 10.9 g/dL — AB (ref 12.0–15.0)
MCH: 29 pg (ref 26.0–34.0)
MCHC: 32.4 g/dL (ref 30.0–36.0)
MCV: 89.4 fL (ref 78.0–100.0)
PLATELETS: 190 10*3/uL (ref 150–400)
RBC: 3.76 MIL/uL — ABNORMAL LOW (ref 3.87–5.11)
RDW: 19.1 % — ABNORMAL HIGH (ref 11.5–15.5)
WBC: 7.7 10*3/uL (ref 4.0–10.5)

## 2013-08-12 LAB — GLUCOSE, CAPILLARY: Glucose-Capillary: 107 mg/dL — ABNORMAL HIGH (ref 70–99)

## 2013-08-12 MED ORDER — LEVOFLOXACIN 500 MG PO TABS: 500.0000 mg | ORAL_TABLET | ORAL | Status: DC

## 2013-08-12 MED ORDER — FERROUS GLUCONATE 324 (38 FE) MG PO TABS: 324.0000 mg | ORAL_TABLET | Freq: Every day | ORAL | Status: DC

## 2013-08-12 MED ORDER — ASPIRIN 81 MG PO TBEC: 81.0000 mg | DELAYED_RELEASE_TABLET | Freq: Every day | ORAL | Status: DC

## 2013-08-12 MED ORDER — FUROSEMIDE 20 MG PO TABS: 20.0000 mg | ORAL_TABLET | Freq: Every day | ORAL | Status: DC

## 2013-08-12 NOTE — Care Management Note (Signed)
    Page 1 of 1   08/12/2013     11:21:14 AM CARE MANAGEMENT NOTE 08/12/2013  Patient:  Barbara Garner, Barbara Garner   Account Number:  [id number]  Date Initiated:  08/12/2013  Documentation initiated by:  GRAVES-BIGELOW,Ivonne [place]  Subjective/Objective Assessment:   Pt admitted for Resp Failure. Plan for d/c home today. Has support of 2 sons. Pt is agreeable to [facility] services with [facility].     Action/Plan:   CM did make referral with [facility] and SOC to begin within 24-48 hours of d/c. No further needs from CM at this time.   Anticipated DC Date:  08/12/2013   Anticipated DC Plan:  [place]  CM consult      [facility] Choice  HOME HEALTH   Choice offered to / List presented to:  C-1 Patient        [place] arranged  HH-1 RN  [place]      [place] agency  [facility]   Status of service:  Completed, signed off Medicare Important Message given?  YES (If response is "NO", the following Medicare IM given date fields will be blank) Date Medicare IM given:  08/09/2013 Date Additional Medicare IM given:  08/12/2013  Discharge Disposition:  [place]  Per UR Regulation:  Reviewed for med. necessity/level of care/duration of stay  If discussed at [place] of Stay Meetings, dates discussed:    Comments:

## 2013-08-12 NOTE — Discharge Summary (Signed)
Physician Discharge Summary  Patient ID: Barbara Garner MRN: [id number] DOB/AGE: 07-06-1934 79 y.o.  Admit date: 08/09/2013 Discharge date: 08/12/2013  Primary Care Physician:  Barbara Pardon, MD  Discharge Diagnoses:    . Acute and chronic respiratory failure . UTI (urinary tract infection) . GERD . ANEMIA-IRON DEFICIENCY . CKD (chronic kidney disease) stage 4, GFR 15-29 ml/min . HYPOTHYROIDISM . IVCD (intraventricular conduction defect) . Atrial fibrillation . Chronic diastolic CHF (congestive heart failure) . DIABETES MELLITUS, TYPE II . CORONARY ARTERY DISEASE . HYPERCHOLESTEROLEMIA, PURE   Consults:  Cardiology   Recommendations for Outpatient Follow-up:  I got her appointment with gastroenterology on 08/19/2013, patient was relay if she had any endoscopy, outpatient colonoscopy for iron deficiency anemia. Patient will need further GI workup.  She needs BMET in 1 week.  Allergies:   Allergies  Allergen Reactions  . Metformin     REACTION: intolerant  . Promethazine Hcl     REACTION: u/k     Discharge Medications:   Medication List    STOP taking these medications       lisinopril 5 MG tablet  Commonly known as:  PRINIVIL,ZESTRIL      TAKE these medications       amLODipine 10 MG tablet  Commonly known as:  NORVASC  Take 10 mg by mouth daily.     aspirin 81 MG EC tablet  Take 1 tablet (81 mg total) by mouth daily.     atorvastatin 40 MG tablet  Commonly known as:  LIPITOR  Take 40 mg by mouth daily.     carvedilol 12.5 MG tablet  Commonly known as:  COREG  Take 18.75 mg by mouth 2 (two) times daily with a meal.     ferrous gluconate 324 MG tablet  Commonly known as:  FERGON  Take 1 tablet (324 mg total) by mouth daily with breakfast.     fluticasone 50 MCG/ACT nasal spray  Commonly known as:  FLONASE  Place 1 spray into both nostrils daily.     furosemide 20 MG tablet  Commonly known as:  LASIX  Take 1 tablet (20 mg total) by mouth daily.   Start taking on:  08/13/2013     glipiZIDE 10 MG 24 hr tablet  Commonly known as:  GLUCOTROL XL  Take 10 mg by mouth 2 (two) times daily.     levofloxacin 500 MG tablet  Commonly known as:  LEVAQUIN  Take 1 tablet (500 mg total) by mouth every other day. X 3 more doses  Start taking on:  08/13/2013     levothyroxine 50 MCG tablet  Commonly known as:  SYNTHROID, LEVOTHROID  Take 50 mcg by mouth daily before breakfast.     omeprazole 20 MG capsule  Commonly known as:  PRILOSEC  Take 20 mg by mouth daily.     tolterodine 2 MG 24 hr capsule  Commonly known as:  DETROL LA  Take 1 capsule (2 mg total) by mouth daily.     Vitamin D-3 1000 UNITS Caps  Take 1 capsule by mouth daily.         Brief H and P: For complete details please refer to admission H and P, but in briefPearl Garner is a 79 y.o. female, with history of CAD status post CABG, Chronic diastolic dysfunction EF in 2013 was 60%, Complete heart block status post pacemaker placement, status post aortic valve replacement (bovine), LBBB, HTN, Hypothyroidism, Dyslipidemia, CK D4 baseline creatinine around 1.5, iron deficiency anemia,  type 2 diabetes mellitus, GERD presented to the ER with 2-3 day history of a dry cough, mild wheezing and exertional shortness of breath, denies any chest pain or palpitations, no fever chills, no long travel, no exposure to sick contacts, no weight gain or edema in legs, no orthopnea. In the ER after receiving some Lasix she felt a whole lot better is in no distress, denies any abdominal pain or discomfort, no blood in stool or urine no melena. No focal weakness.  In the ER workup consistent of acute on chronic respiratory failure due to acute on chronic diastolic CHF along with possible mild URI, point of care troponin was mildly elevated suggesting to a possible NSTEMI, case was discussed with cardiologist Dr. Wynonia Lawman by ER physician who requested hospitalist admission and agreed to do a  consultation.   Hospital Course:   Acute and chronic respiratory failure; improved  A) likely due to acute and chronic diastolic CHF  - Patient had received IV Lasix with significant improvement in her symptoms, continue beta blocker, aspirin. Cardiology was consulted. 2-D echo done EF [id number], grade 2 diastolic dysfunction, bioprosthetic aortic valve, normal range of motion  Creatinine however are currently up due to IV Lasix and lisinopril, peaked at 2.2, received IV fluids x 1 liter, then cr 1.8. Cardiology recommended starting oral lasix daily tomorrow and BMET in 1 week.     B) mild acute bronchitis: Wheezing improved,  Continue levofloxacin, bronchodilators as needed   ACUTE RENAL INSUFFICIENCY ON CKD (chronic kidney disease) stage 4, GFR 15-29 ml/min, likely worsened due to Lasix, lisinopril, anemia, UTI  - Creatinine baseline 1.4-1.5, worsened to 2.2 after IV Lasix and lisinopril patient was provided with IV hydration which improved the creatinine to 1.8. Cardiology recommended starting low dose of oral Lasix 20 mg daily on 5/27 and repeat BMET in 1 week.  Urinary tract infection  - Urine culture showed Escherichia coli, continue levofloxacin to complete course.  Elevated troponin, point of care:  - Subsequent troponins negative, no chest pain, old left bundle-branch on EKG,  - Cardiology consulted, EF preserved at 60-65%   Anemia: Patient has known history of iron deficiency anemia, stool occult test 5/23 negative, hemoglobin dropped down to 7.7 today.  Baseline hemoglobin unknown, status post 2 units of packed RBC transfusion, hemoglobin 10.9 patient's hemoglobin has remained stable after the transfusion. She had no GI bleeding during hospitalization. She does not want to stay in the hospital to have any further evaluation. I have set her up with GI appointment on 08/19/2013 for outpatient workup, likely outpatient colonoscopy.   HYPOTHYROIDISM - Continue Synthroid  History of  bovine aortic valve - no acute issues, echo repeated.  History of complete heart block status post placement of placement. No acute issues    DIABETES MELLITUS, TYPE II   GERD  -Continue PPI  Atrial fibrillation  - Currently in normal sinus rhythm  Acute and chronic respiratory failure; improved  A) likely due to acute and chronic diastolic CHF  - Patient had received IV Lasix, continue beta blocker, aspirin  - Cardiology following, 2-D echo done EF [id number], grade 2 diastolic dysfunction, bioprosthetic aortic valve, normal range of motion  - holding lisinopril and Lasix due to worsening creatinine 2.2 today, will place on gentle hydration today, for 1 L and then reassess  B) mild acute bronchitis: Wheezing improved  - Continue levofloxacin, bronchodilators as needed  ACUTE RENAL INSUFFICIENCY ON CKD (chronic kidney disease) stage 4, GFR 15-29 ml/min, likely worsened  due to Lasix, lisinopril, anemia, UTI  - Creatinine baseline 1.4-1.5, worsened to 2.2, despite transfusion, hold Lasix and lisinopril, placed on gentle hydration  Urinary tract infection  - Urine culture showed Escherichia coli, currently on levofloxacin, will follow sensitivities  Elevated troponin, point of care:  - Subsequent troponins negative, no chest pain, old left bundle-branch on EKG,  - Cardiology consulted, EF preserved at 60-65%  Anemia: Patient has known history of iron deficiency anemia, stool occult test 5/23 negative, hemoglobin dropped down to 7.7 today.  - Baseline hemoglobin unknown, status post 2 units of packed RBC transfusion, hemoglobin 10.6  - Patient should have outpatient GI evaluation, may need colonoscopy/endoscopy. I can set her up with GI appointment in a.m. however if hemoglobin times down or stool occult test is positive, may need inpatient workup.   Mild leukocytosis, likely due to acute bronchitis  - Continue Levaquin for 3 more doses to complete the course  HYPOTHYROIDISM  - Continue  Synthroid  History of bovine aortic valve - no acute issues, echo repeated.  History of complete heart block status post placement of placement. No acute issues    DIABETES MELLITUS, TYPE II: Remained stable  GERD -Continue PPI      Day of Discharge BP 129/62  Pulse 71  Temp(Src) 97.7 F (36.5 C) (Oral)  Resp 18  Ht 5\' 2"  (1.575 m)  Wt 52.663 kg (116 lb 1.6 oz)  BMI 21.23 kg/m2  SpO2 97%  Physical Exam: General: Alert and awake oriented x3 not in any acute distress. CVS: S1-S2 clear no murmur rubs or gallops Chest: clear to auscultation bilaterally, no wheezing rales or rhonchi Abdomen: soft nontender, nondistended, normal bowel sounds Extremities: no cyanosis, clubbing or edema noted bilaterally Neuro: Cranial nerves II-XII intact, no focal neurological deficits   The results of significant diagnostics from this hospitalization (including imaging, microbiology, ancillary and laboratory) are listed below for reference.    LAB RESULTS: Basic Metabolic Panel:  Recent Labs Lab 08/11/13 0335 08/12/13 0535  NA 138 141  K 4.3 4.6  CL 104 108  CO2 20 21  GLUCOSE 98 122*  BUN 81* 62*  CREATININE 2.21* 1.87*  CALCIUM 8.4 8.5   Liver Function Tests:  Recent Labs Lab 08/09/13 0850  AST 17  ALT 9  ALKPHOS 108  BILITOT 0.7  PROT 7.4  ALBUMIN 3.4*   No results found for this basename: LIPASE, AMYLASE,  in the last 168 hours No results found for this basename: AMMONIA,  in the last 168 hours CBC:  Recent Labs Lab 08/09/13 0850  08/11/13 0335 08/12/13 0535  WBC 15.5*  < > 12.6* 7.7  NEUTROABS 12.4*  --   --   --   HGB 9.5*  < > 10.6* 10.9*  HCT 27.7*  < > 31.3* 33.6*  MCV 96.2  < > 86.9 89.4  PLT 194  < > 187 190  < > = values in this interval not displayed. Cardiac Enzymes:  Recent Labs Lab 08/09/13 1900 08/10/13 0019  TROPONINI <0.30 <0.30   BNP: No components found with this basename: POCBNP,  CBG:  Recent Labs Lab 08/11/13 2152  08/12/13 0753  GLUCAP 130* 107*    Significant Diagnostic Studies:  Dg Chest 2 View  08/09/2013   CLINICAL DATA:  Worsening shortness of breath  EXAM: CHEST - 2 VIEW  COMPARISON:  12/11/2011  FINDINGS: Previous CABG and AVR. Left subclavian pacemaker stable. Mild cardiomegaly. Probable moderate hiatal hernia. Atheromatous aorta. Mild perihilar and  bibasilar interstitial edema with septal lines seen peripherally in both lung bases. No effusion. Marland Kitchen Spondylitic changes in the visualized upper lumbar spine.  IMPRESSION: 1. Mild interstitial edema, new since prior study. 2. Hiatal hernia. 3. Stable chronic and postoperative changes.   Electronically Signed   By: Arne Cleveland M.D.   On: 08/09/2013 09:34    2D ECHO: Study Conclusions  - Left ventricle: The cavity size was normal. Wall thickness was normal. Systolic function was normal. The estimated ejection fraction was in the range of 60% to 65%. Features are consistent with a pseudonormal left ventricular filling pattern, with concomitant abnormal relaxation and increased filling pressure (grade 2 diastolic dysfunction). - Aortic valve: A bioprosthesis was present. The prosthesis had a normal range of motion. Valve area (VTI): 1.27 cm^2. Valve area (Vmax): 1.18 cm^2. - Mitral valve: Calcified annulus. Moderately thickened leaflets . The findings are consistent with mild stenosis. There was mild to moderate regurgitation. Valve area by continuity equation (using LVOT flow): 1.63 cm^2. - Left atrium: The atrium was moderately dilated.    Disposition and Follow-up:    DISPOSITION: Home DIET: Carb modified TESTS THAT NEED FOLLOW-UP BMET  DISCHARGE FOLLOW-UP Follow-up Information   Follow up with Nicoletta Ba, PA-C On 08/19/2013. (at 8:30AM )    Specialty:  Gastroenterology   Contact information:   [place] [address] [phone]       Follow up with Barbara Pardon, MD. Schedule an appointment as soon as possible  for a visit in 2 weeks. (for hospital follow-up, please check CBC and BMET (for kidney function))    Specialties:  Family Medicine, Radiology   Contact information:   [address] [place] [address] [phone]       Follow up with [facility]. (The office will call you to make an appointment in 1 WEEK. Please call the office if you don't hear from them,)    Contact information:   [place]  [phone]       Time spent on Discharge: 45 mins   Signed:   Mendel Corning M.D. [facility] 08/12/2013, 10:52 AM Pager: [phone]   **Disclaimer: This note was dictated with voice recognition software. Similar sounding words can inadvertently be transcribed and this note may contain transcription errors which may not have been corrected upon publication of note.**

## 2013-08-12 NOTE — Progress Notes (Signed)
1121 08-12-13  Medicare IM signed by patient and signed copy left on chart. Ocie Cornfield [place], RN,BSN [phone]

## 2013-08-12 NOTE — Progress Notes (Signed)
Patient Name: Barbara Garner Date of Encounter: 08/12/2013     Principal Problem:   Acute and chronic respiratory failure Active Problems:   HYPOTHYROIDISM   DIABETES MELLITUS, TYPE II   HYPERCHOLESTEROLEMIA, PURE   ANEMIA-IRON DEFICIENCY   MYOCARDIAL INFARCTION, HX OF   CORONARY ARTERY DISEASE   GERD   IVCD (intraventricular conduction defect)   CKD (chronic kidney disease) stage 4, GFR 15-29 ml/min   S/P aortic valve replacement   S/P CABG x 3   Atrial fibrillation   Chronic diastolic CHF (congestive heart failure)   UTI (urinary tract infection)    SUBJECTIVE  Barbara Garner is a 79 yo female with PMH significant for diastolic HF, CAD s/p CABG 2013, AS s/p AVR, CHB s/p PPM, and CKD who was admitted for dyspnea likely due to HF exacerbation.  No complaints today. She feels her breathing is much improved. Denies CP, SOB, palpitations, LEE.   CURRENT MEDS . amLODipine  10 mg Oral Daily  . aspirin EC  81 mg Oral Daily  . atorvastatin  40 mg Oral q1800  . carvedilol  18.75 mg Oral BID WC  . fesoterodine  8 mg Oral Daily  . fluticasone  1 spray Each Nare Daily  . insulin aspart  0-5 Units Subcutaneous QHS  . insulin aspart  0-9 Units Subcutaneous TID WC  . levofloxacin  500 mg Oral Q48H  . levothyroxine  50 mcg Oral QAC breakfast  . nitroGLYCERIN  0.5 inch Topical 4 times per day  . pantoprazole  40 mg Oral Daily  . pneumococcal 23 valent vaccine  0.5 mL Intramuscular Tomorrow-1000  . sodium chloride  3 mL Intravenous Q12H    OBJECTIVE  Filed Vitals:   08/11/13 0641 08/11/13 1400 08/11/13 2100 08/12/13 0621  BP:  111/56 115/53 129/62  Pulse:  65 60 71  Temp:  97.5 F (36.4 C) 98 F (36.7 C) 97.7 F (36.5 C)  TempSrc:  Oral Oral Oral  Resp:  18 18 18   Height:      Weight: 116 lb 1.6 oz (52.663 kg)     SpO2:  96% 96% 97%    Intake/Output Summary (Last 24 hours) at 08/12/13 0853 Last data filed at 08/12/13 0809  Gross per 24 hour  Intake    620 ml  Output     200 ml  Net    420 ml   Filed Weights   08/09/13 1236 08/10/13 0521 08/11/13 0641  Weight: 113 lb 4 oz (51.37 kg) 117 lb 11.2 oz (53.388 kg) 116 lb 1.6 oz (52.663 kg)    PHYSICAL EXAM  General: Pleasant, NAD. Neuro: Alert and oriented X 3. Moves all extremities spontaneously. Psych: Normal affect. HEENT:  Normal  Neck: Supple without bruits or JVD. Lungs:  Resp regular and unlabored, CTA. Heart: RRR no s3, s4. Grade 2 systolic murmur R/LUSB. Abdomen: Soft, non-tender, non-distended, BS + x 4.  Extremities: No clubbing, cyanosis or edema. DP/PT/Radials 2+ and equal bilaterally.  Accessory Clinical Findings  CBC  Recent Labs  08/11/13 0335 08/12/13 0535  WBC 12.6* 7.7  HGB 10.6* 10.9*  HCT 31.3* 33.6*  MCV 86.9 89.4  PLT 187 [id number]   Basic Metabolic Panel  Recent Labs  08/11/13 0335 08/12/13 0535  NA 138 141  K 4.3 4.6  CL 104 108  CO2 20 21  GLUCOSE 98 122*  BUN 81* 62*  CREATININE 2.21* 1.87*  CALCIUM 8.4 8.5   Cardiac Enzymes  Recent Labs  08/09/13  1115 08/09/13 1900 08/10/13 0019  TROPONINI <0.30 <0.30 <0.30   Hemoglobin A1C  Recent Labs  08/09/13 1350  HGBA1C 6.3*    TELE  NSR, occasional PVCs noted  ECG  08/10/13 NSR, LBBB  Radiology/Studies  Dg Chest 2 View  08/09/2013   CLINICAL DATA:  Worsening shortness of breath  EXAM: CHEST - 2 VIEW  COMPARISON:  12/11/2011  FINDINGS: Previous CABG and AVR. Left subclavian pacemaker stable. Mild cardiomegaly. Probable moderate hiatal hernia. Atheromatous aorta. Mild perihilar and bibasilar interstitial edema with septal lines seen peripherally in both lung bases. No effusion. Marland Kitchen Spondylitic changes in the visualized upper lumbar spine.  IMPRESSION: 1. Mild interstitial edema, new since prior study. 2. Hiatal hernia. 3. Stable chronic and postoperative changes.   Electronically Signed   By: Arne Cleveland M.D.   On: 08/09/2013 09:34   Echo 08/09/13  Study Conclusions - Left ventricle: The cavity  size was normal. Wall thickness was normal. Systolic function was normal. The estimated ejection fraction was in the range of 60% to 65%. Features are consistent with a pseudonormal left ventricular filling pattern, with concomitant abnormal relaxation and increased filling pressure (grade 2 diastolic dysfunction). - Aortic valve: A bioprosthesis was present. The prosthesis had a normal range of motion. Valve area (VTI): 1.27 cm^2. Valve area (Vmax): 1.18 cm^2. - Mitral valve: Calcified annulus. Moderately thickened leaflets . The findings are consistent with mild stenosis. There was mild to moderate regurgitation. Valve area by continuity equation (using LVOT flow): 1.63 cm^2. - Left atrium: The atrium was moderately dilated.   ASSESSMENT AND PLAN  1. Acute on chronic diastolic CHF: Wt up since admission, I/O +454.5 since admission. However, patient is not volume overloaded on exam and endorses decreased dyspnea since admission. Creatinine bumped up to 2.21 yesterday so ACE and diuretics were d/c. IVF given yesterday. Cr 1.87 today (baseline Cr 1.6 8 mos ago). Continue to hold IV Lasix as Cr improves. OK to discharge today as patient is improved clinically. Restart po Lasix 20mg  daily beginning tomorrow as patient may have increased salt consumption once home. F/U BMP in outpatient setting with Dr. Marigene Ehlers in one week.   2. Anemia: Hgb has increased 7.7 --> 10.9 today 5/26 after 2 units PRBCs. Source of anemia unclear. Patient is not on anticoagulation for AVR. FOBT negative. F/U outpatient GI.   3. CAD with hx of CABG in 08/2011: She denies CP. Troponin negative x 3. Continue coreg and ASA.   4. Bio Prosthetic AoV: Echo states that valve is functioning normally. Patient not on anticoagulation.   5. CHB: S/P Medtronic Dual chamber 2013. Last check 05/12/2013. Follow appt to be interrogated in August of 2015.   Rondel Baton, PA-S  Signed, Perry Mount PA-C  Pager  [phone]  Agree with above assessment. Will send her home on slightly higher lasix dose 20 mg daily to prevent reaccumulation of fluid. She previously was on 20 mg QOD. She will have home health checking on her.

## 2013-08-12 NOTE — Progress Notes (Signed)
ANTIBIOTIC CONSULT NOTE - FOLLOW UP  Pharmacy Consult:  Levaquin Indication:  Upper Respiratory Infection  Allergies  Allergen Reactions  . Metformin     REACTION: intolerant  . Promethazine Hcl     REACTION: u/k    Patient Measurements: Height: 5\' 2"  (157.5 cm) Weight: 116 lb 1.6 oz (52.663 kg) IBW/kg (Calculated) : 50.1  Vital Signs: Temp: 97.7 F (36.5 C) (05/26 0621) Temp src: Oral (05/26 0621) BP: 129/62 mmHg (05/26 0621) Pulse Rate: 71 (05/26 0621) Intake/Output from previous day: 05/25 0701 - 05/26 0700 In: 500 [P.O.:500] Out: 200 [Urine:200] Intake/Output from this shift: Total I/O In: 120 [P.O.:120] Out: -   Labs:  Recent Labs  08/09/13 1350 08/09/13 2349 08/10/13 0019 08/11/13 0335 08/12/13 0535  WBC 13.8*  --  10.2 12.6* 7.7  HGB 9.0*  --  7.7* 10.6* 10.9*  PLT 188  --  176 187 190  LABCREA  --  135.58  --   --   --   CREATININE 1.52*  --  2.08* 2.21* 1.87*   Estimated Creatinine Clearance: 19.9 ml/min (by C-G formula based on Cr of 1.87). No results found for this basename: VANCOTROUGH, VANCOPEAK, VANCORANDOM, GENTTROUGH, GENTPEAK, GENTRANDOM, TOBRATROUGH, TOBRAPEAK, TOBRARND, AMIKACINPEAK, AMIKACINTROU, AMIKACIN,  in the last 72 hours   Microbiology: Recent Results (from the past 720 hour(s))  URINE CULTURE     Status: None   Collection Time    07/29/13 10:52 AM      Result Value Ref Range Status   Colony Count NO GROWTH   Final   Organism ID, Bacteria NO GROWTH   Final  URINE CULTURE     Status: None   Collection Time    08/09/13 11:37 AM      Result Value Ref Range Status   Specimen Description URINE, RANDOM   Final   Special Requests NONE   Final   Culture  Setup Time     Final   Value: 08/09/2013 12:25     Performed at [place]     Final   Value: >=100,000 COLONIES/ML     Performed at [facility]   Culture     Final   Value: ESCHERICHIA COLI     Performed at [facility]   Report  Status 08/12/2013 FINAL   Final   Organism ID, Bacteria ESCHERICHIA COLI   Final  URINE CULTURE     Status: None   Collection Time    08/09/13 11:49 PM      Result Value Ref Range Status   Specimen Description URINE, CLEAN CATCH   Final   Special Requests NONE   Final   Culture  Setup Time     Final   Value: 08/10/2013 00:41     Performed at [place]     Final   Value: >=100,000 COLONIES/ML     Performed at [facility]   Culture     Final   Value: Multiple bacterial morphotypes present, none predominant. Suggest appropriate recollection if clinically indicated.     Performed at [facility]   Report Status 08/11/2013 FINAL   Final      Assessment: 46 YOF admitted with NSTEMI and ARF secondary to acute CHF.  Pharmacy managing Levaquin for URI and E.coli UTI.  Patient's renal function improved today.  5/23 urine - E.coli (sensitive to all except ampicillin)   Goal of Therapy:  Clearance of infection  Plan:  - Continue LVQ 500mg  PO Q48H - Monitor renal fxn, clinical course, LOT - F/U iron supplementation    Cayton Cuevas D. Mina Marble, PharmD, BCPS Pager:  [phone] 08/12/2013, 8:25 AM

## 2013-08-12 NOTE — Progress Notes (Signed)
Barbara Garner to be D/C'd Home per MD order.  Discussed with the patient and all questions fully answered.    Medication List    STOP taking these medications       lisinopril 5 MG tablet  Commonly known as:  PRINIVIL,ZESTRIL      TAKE these medications       amLODipine 10 MG tablet  Commonly known as:  NORVASC  Take 10 mg by mouth daily.     aspirin 81 MG EC tablet  Take 1 tablet (81 mg total) by mouth daily.     atorvastatin 40 MG tablet  Commonly known as:  LIPITOR  Take 40 mg by mouth daily.     carvedilol 12.5 MG tablet  Commonly known as:  COREG  Take 18.75 mg by mouth 2 (two) times daily with a meal.     ferrous gluconate 324 MG tablet  Commonly known as:  FERGON  Take 1 tablet (324 mg total) by mouth daily with breakfast.     fluticasone 50 MCG/ACT nasal spray  Commonly known as:  FLONASE  Place 1 spray into both nostrils daily.     furosemide 20 MG tablet  Commonly known as:  LASIX  Take 1 tablet (20 mg total) by mouth daily.  Start taking on:  08/13/2013     glipiZIDE 10 MG 24 hr tablet  Commonly known as:  GLUCOTROL XL  Take 10 mg by mouth 2 (two) times daily.     levofloxacin 500 MG tablet  Commonly known as:  LEVAQUIN  Take 1 tablet (500 mg total) by mouth every other day. X 3 more doses  Start taking on:  08/13/2013     levothyroxine 50 MCG tablet  Commonly known as:  SYNTHROID, LEVOTHROID  Take 50 mcg by mouth daily before breakfast.     omeprazole 20 MG capsule  Commonly known as:  PRILOSEC  Take 20 mg by mouth daily.     tolterodine 2 MG 24 hr capsule  Commonly known as:  DETROL LA  Take 1 capsule (2 mg total) by mouth daily.     Vitamin D-3 1000 UNITS Caps  Take 1 capsule by mouth daily.        VVS, Skin clean, dry and intact without evidence of skin break down, no evidence of skin tears noted. IV catheter discontinued intact. Site without signs and symptoms of complications. Dressing and pressure applied.  An After Visit Summary  was printed and given to the patient. Patient escorted via [place], and D/C home via private auto.  Olene Floss 08/12/2013 11:19 AM

## 2013-08-13 ENCOUNTER — Telehealth: Payer: Self-pay | Admitting: Physician Assistant

## 2013-08-13 ENCOUNTER — Telehealth: Payer: Self-pay

## 2013-08-13 NOTE — Telephone Encounter (Signed)
Verbal order given and Elmyra Ricks will send a copy of pt's labs

## 2013-08-13 NOTE — Telephone Encounter (Signed)
Please ok those orders and yes-I would like to be copied on the labs -thanks

## 2013-08-13 NOTE — Telephone Encounter (Signed)
Patient contacted regarding discharge from [facility] on 08/12/13.  Patient understands to follow up with provider michele lenze pa on 08/18/13 at 10:45 am at [address] Patient understands discharge instructions? yes Patient understands medications and regiment? yes Patient understands to bring all medications to this visit? yes

## 2013-08-13 NOTE — Telephone Encounter (Signed)
New message     TCM appt on 08-18-13 at 10:45 with Estella Husk per Joellen Jersey.  Katie wanted pt seen next week.dp

## 2013-08-13 NOTE — Telephone Encounter (Signed)
Elmyra Ricks with [facility] left v/m requesting verbal orders for home health nursing for 2 x a week for this week and 3 x a week next week and then 2x week for 4 wk and 1 x a week for 3 more weeks.Spoke with Elmyra Ricks to verify home health nursing orders requested. Hospital ordered Met B drawn next week; does Dr Glori Bickers want Met B results faxed to her? Elmyra Ricks request cb.

## 2013-08-14 ENCOUNTER — Telehealth: Payer: Self-pay | Admitting: Family Medicine

## 2013-08-14 NOTE — Telephone Encounter (Signed)
Called pt to schedule hospital f/u.  She states that she will have to call back after she arranges a day that she can get transportation to our office.

## 2013-08-14 NOTE — Telephone Encounter (Signed)
Received Nicole's Email with pt's labs, labs placed in your inbox

## 2013-08-15 ENCOUNTER — Telehealth: Payer: Self-pay

## 2013-08-15 NOTE — Telephone Encounter (Signed)
Please ok those verbal orders -thanks

## 2013-08-15 NOTE — Telephone Encounter (Signed)
Darletta Moll Tailor PT with [facility] left v/m requesting verbal orders for home health PT 2 x a week for 5 weeks.Please advise.

## 2013-08-18 ENCOUNTER — Encounter: Payer: Self-pay | Admitting: Physician Assistant

## 2013-08-18 ENCOUNTER — Other Ambulatory Visit: Payer: Medicare Other

## 2013-08-18 ENCOUNTER — Ambulatory Visit (INDEPENDENT_AMBULATORY_CARE_PROVIDER_SITE_OTHER): Payer: Medicare Other | Admitting: Physician Assistant

## 2013-08-18 VITALS — BP 138/70 | HR 70 | Ht 62.0 in | Wt 114.4 lb

## 2013-08-18 DIAGNOSIS — D509 Iron deficiency anemia, unspecified: Secondary | ICD-10-CM

## 2013-08-18 DIAGNOSIS — I509 Heart failure, unspecified: Secondary | ICD-10-CM

## 2013-08-18 DIAGNOSIS — I6529 Occlusion and stenosis of unspecified carotid artery: Secondary | ICD-10-CM

## 2013-08-18 DIAGNOSIS — I4891 Unspecified atrial fibrillation: Secondary | ICD-10-CM

## 2013-08-18 DIAGNOSIS — I5032 Chronic diastolic (congestive) heart failure: Secondary | ICD-10-CM

## 2013-08-18 DIAGNOSIS — I10 Essential (primary) hypertension: Secondary | ICD-10-CM

## 2013-08-18 HISTORY — PX: COLONOSCOPY: SHX174

## 2013-08-18 LAB — CBC WITH DIFFERENTIAL/PLATELET
BASOS PCT: 0.5 % (ref 0.0–3.0)
Basophils Absolute: 0 10*3/uL (ref 0.0–0.1)
EOS PCT: 2 % (ref 0.0–5.0)
Eosinophils Absolute: 0.1 10*3/uL (ref 0.0–0.7)
HCT: 38.3 % (ref 36.0–46.0)
Hemoglobin: 12.4 g/dL (ref 12.0–15.0)
Lymphocytes Relative: 25.7 % (ref 12.0–46.0)
Lymphs Abs: 1.8 10*3/uL (ref 0.7–4.0)
MCHC: 32.3 g/dL (ref 30.0–36.0)
MCV: 91.5 fl (ref 78.0–100.0)
MONO ABS: 0.6 10*3/uL (ref 0.1–1.0)
Monocytes Relative: 8.4 % (ref 3.0–12.0)
Neutro Abs: 4.3 10*3/uL (ref 1.4–7.7)
Neutrophils Relative %: 63.4 % (ref 43.0–77.0)
Platelets: 254 10*3/uL (ref 150.0–400.0)
RBC: 4.19 Mil/uL (ref 3.87–5.11)
RDW: 19.1 % — ABNORMAL HIGH (ref 11.5–15.5)
WBC: 6.8 10*3/uL (ref 4.0–10.5)

## 2013-08-18 LAB — BASIC METABOLIC PANEL
BUN: 18 mg/dL (ref 6–23)
CHLORIDE: 108 meq/L (ref 96–112)
CO2: 26 mEq/L (ref 19–32)
CREATININE: 1.2 mg/dL (ref 0.4–1.2)
Calcium: 8.8 mg/dL (ref 8.4–10.5)
GFR: 45.82 mL/min — ABNORMAL LOW (ref >60.00)
Glucose, Bld: 94 mg/dL (ref 70–99)
Potassium: 3.9 mEq/L (ref 3.5–5.1)
SODIUM: 141 meq/L (ref 135–145)

## 2013-08-18 NOTE — Assessment & Plan Note (Signed)
Check CBC with bmet today to see GI tomorrow.

## 2013-08-18 NOTE — Assessment & Plan Note (Signed)
Blood pressure stable ? ?

## 2013-08-18 NOTE — Assessment & Plan Note (Signed)
Patient is overdue for carotid Dopplers. Will schedule.

## 2013-08-18 NOTE — Assessment & Plan Note (Signed)
Check BMET today 

## 2013-08-18 NOTE — Progress Notes (Signed)
HPI: This is a 79 year old female patient with history of coronary artery disease status post CABG, chronic diastolic dysfunction EF [id number], complete heart block status post pacemaker placement, status post bovine aortic valve replacement, left bundle branch block, hypertension, and chronic kidney disease. She was admitted to the hospital with acute on chronic respiratory failure due to acute on chronic diastolic heart failure and acute bronchitis. She also had acute renal insufficiency on chronic kidney disease creatinine jumped up to 2.2 but was down to 1.8 at discharge. She also had a UTI. Her hemoglobin dropped to 7.7. Her stool Hemoccults were negative. She does have a history of iron deficiency anemia. She was transfused 2 units of packed RBCs. Hemoglobin was 10.9 after transfusions.  Patient comes in today feeling quite well. She denies any problems with chest pain, palpitations, dyspnea, dyspnea on exertion, dizziness, or presyncope. She has mild swelling in her feet today. She admits to eating a lot of salty foods including sausage biscuits and fries. She lives with her disabled son. She has an appointment with a gastroenterologist tomorrow.  Allergies -- Metformin    --  REACTION: intolerant  -- Promethazine Hcl    --  REACTION: u/k  Current Outpatient Prescriptions on File Prior to Visit: amLODipine (NORVASC) 10 MG tablet, Take 10 mg by mouth daily. , Disp: , Rfl:  aspirin EC 81 MG EC tablet, Take 1 tablet (81 mg total) by mouth daily., Disp: 30 tablet, Rfl: 3 atorvastatin (LIPITOR) 40 MG tablet, Take 40 mg by mouth daily., Disp: , Rfl:  carvedilol (COREG) 12.5 MG tablet, Take 18.75 mg by mouth 2 (two) times daily with a meal., Disp: , Rfl:  Cholecalciferol (VITAMIN D-3) 1000 UNITS CAPS, Take 1 capsule by mouth daily., Disp: , Rfl:  ferrous gluconate (FERGON) 324 MG tablet, Take 1 tablet (324 mg total) by mouth daily with breakfast., Disp: 30 tablet, Rfl: 3 fluticasone (FLONASE) 50 MCG/ACT  nasal spray, Place 1 spray into both nostrils daily., Disp: , Rfl:  furosemide (LASIX) 20 MG tablet, Take 1 tablet (20 mg total) by mouth daily., Disp: 30 tablet, Rfl:  glipiZIDE (GLUCOTROL XL) 10 MG 24 hr tablet, Take 10 mg by mouth 2 (two) times daily., Disp: , Rfl:  levofloxacin (LEVAQUIN) 500 MG tablet, Take 1 tablet (500 mg total) by mouth every other day. X 3 more doses, Disp: 3 tablet, Rfl: 0 levothyroxine (SYNTHROID, LEVOTHROID) 50 MCG tablet, Take 50 mcg by mouth daily before breakfast., Disp: , Rfl:  omeprazole (PRILOSEC) 20 MG capsule, Take 20 mg by mouth daily., Disp: , Rfl:  tolterodine (DETROL LA) 2 MG 24 hr capsule, Take 1 capsule (2 mg total) by mouth daily., Disp: 30 capsule, Rfl: 5  No current facility-administered medications on file prior to visit.   Past Medical History:   Iron deficiency anemia, unspecified                          Chronic diastolic CHF (congestive heart failur*              CAD  S/P CABG x 3                               08/2011         Comment: LIMA to diagonal branch, SVG to OM1, SVG to               PDA, EVH via  bilateral thighs   Type II or unspecified type diabetes mellitus *              Contact dermatitis and other eczema, due to un*              Esophageal reflux                                            Headache(784.0)                                              Pacemaker MDT dual chamber                                     Comment:DOI 08/2011   Pure hypercholesterolemia                                    hypertension                                                 Hypothyroidism                                               Atrioventricular block, complete -intermittent               Memory loss                                                  Idiopathic peripheral neuropathy                             LBBB (left bundle branch block)                              Carotid stenosis                                                Comment:Carotid dopplers 0000000 with [id number] LICA stenosis   Severe aortic stenosis -S/P AVR                                Comment: 19 mm Edwards Magna Ease pericardial tissue               valve   Extrinsic asthma, unspecified  Comment:hasn't used inhaler in past year   CKD (chronic kidney disease) stage 4, GFR 15-2*                Comment:Cr 1.6 in 6/11, sees Dr. Arty Baumgartner   Impaired vision                                                Comment:pt. reports that she identifies her meds by               looking at the pillls, she is not able to read               labels on bottles  Past Surgical History:   PTCA                                             1992-1997      Comment:5 blockages   [place]         COLONOSCOPY                                      04/1999         Comment:1 polyp   ESOPHAGOGASTRODUODENOSCOPY                       04/1999         Comment:HH; "watermelon stomach" (severe gastritis)   DEXA                                             10/2003 ,7*     Comment:osteoporosis,  Osteopenia   CATARACT EXTRACTION, BILATERAL                   2003         ESOPHAGOGASTRODUODENOSCOPY                       04/2002         Comment:Gastritis   CARDIOVASCULAR STRESS TEST                       10/2002         Comment:normal (per patient)   ABDOMINAL HYSTERECTOMY                           1977           Comment:partial; fibroids   bladder tack  1977         COLONOSCOPY                                      06/2004         Comment:Neg. Int hem   carotid dopplers                                 2006         REFRACTIVE SURGERY                               2006         Opthy                                            11/1998;12*   AORTIC VALVE REPLACEMENT                         09/06/2011      Comment:Procedure: AORTIC  VALVE REPLACEMENT (AVR);                Surgeon: Rexene Alberts, MD;  Location: [facility];              Service: Open Heart Surgery;  Laterality: N/A;   CORONARY ARTERY BYPASS GRAFT                     09/06/2011      Comment:Procedure: CORONARY ARTERY BYPASS GRAFTING               (CABG);  Surgeon: Rexene Alberts, MD;                Location: [facility];  Service: Open Heart Surgery;               Laterality: N/A;  Review of patient's family history indicates:   Stroke                         Father                     Comment: died in his 23's   Stroke                         Mother                     Comment: died in her 74's   Social History   Marital Status: Widowed             Spouse Name:                      Years of Education:                 Number of children:             Occupational History   None on file  Social History Main Topics   Smoking Status: Never Smoker  Smokeless Status: Not on file                      Alcohol Use: No             Drug Use: No             Sexual Activity: No                 Other Topics            Concern   None on file  Social History Narrative   Widowed.  Lives with son in [place].       ROS: See history of present illness otherwise negative   PHYSICAL EXAM: Well-nournished, in no acute distress. Neck: Bilateral carotid bruits, No JVD, HJR, or thyroid enlargement  Lungs: No tachypnea, clear without wheezing, rales, or rhonchi  Cardiovascular: RRR, PMI not displaced, positive S4 and [id number] systolic murmur at the left sternal border, no bruit, thrill, or heave.  Abdomen: BS normal. Soft without organomegaly, masses, lesions or tenderness.  Extremities: Trace of edema in her feet bilaterally otherwise lower extremities without cyanosis, clubbing. Good distal pulses bilateral  SKin: Warm, no lesions or rashes   Musculoskeletal: No deformities  Neuro: no focal signs  BP 138/70  Pulse 70  Ht 5\' 2"   (1.575 m)  Wt 114 lb 6.4 oz (51.891 kg)  BMI 20.92 kg/m2  SpO2 96%  Carotid Dopplers 06/07/11  40-59% bilateral disease  EKG: Atrial paced

## 2013-08-18 NOTE — Assessment & Plan Note (Signed)
Patient's heart failure is pretty well compensated. She has a trace of edema in her feet. I'm reluctant to increase her diuretics until we check her renal function. Recommend 2 g sodium diet. Followup with Dr. Marigene Ehlers in 2 months.

## 2013-08-18 NOTE — Telephone Encounter (Signed)
Verbal orders given to [facility]

## 2013-08-18 NOTE — Patient Instructions (Addendum)
Your physician recommends that you schedule a follow-up appointment in: WITH DR. Aundra Dubin IN 2-3 MONTHS   Your physician recommends that you return for lab work in: [place]. CBC  Your physician recommends that you continue on your current medications as directed.   Please refer to the Current Medication list given to you today.  Your physician has requested that you have a carotid DOPPLER. This test is an ultrasound of the carotid arteries in your neck. It looks at blood flow through these arteries that supply the brain with blood. Allow one hour for this exam. There are no restrictions or special instructions.     2 Gram Low Sodium Diet A 2 gram sodium diet restricts the amount of sodium in the diet to no more than 2 g or 2000 mg daily. Limiting the amount of sodium is often used to help lower blood pressure. It is important if you have heart, liver, or kidney problems. Many foods contain sodium for flavor and sometimes as a preservative. When the amount of sodium in a diet needs to be low, it is important to know what to look for when choosing foods and drinks. The following includes some information and guidelines to help make it easier for you to adapt to a low sodium diet. QUICK TIPS  Do not add salt to food.  Avoid convenience items and fast food.  Choose unsalted snack foods.  Buy lower sodium products, often labeled as "lower sodium" or "no salt added."  Check food labels to learn how much sodium is in 1 serving.  When eating at a restaurant, ask that your food be prepared with less salt or none, if possible. READING FOOD LABELS FOR SODIUM INFORMATION The nutrition facts label is a good place to find how much sodium is in foods. Look for products with no more than 500 to 600 mg of sodium per meal and no more than 150 mg per serving. Remember that 2 g = 2000 mg. The food label may also list foods as:  Sodium-free: Less than 5 mg in a serving.  Very low sodium: 35 mg or less  in a serving.  Low-sodium: 140 mg or less in a serving.  Light in sodium: 50% less sodium in a serving. For example, if a food that usually has 300 mg of sodium is changed to become light in sodium, it will have 150 mg of sodium.  Reduced sodium: 25% less sodium in a serving. For example, if a food that usually has 400 mg of sodium is changed to reduced sodium, it will have 300 mg of sodium. CHOOSING FOODS Grains  Avoid: Salted crackers and snack items. Some cereals, including instant hot cereals. Bread stuffing and biscuit mixes. Seasoned rice or pasta mixes.  Choose: Unsalted snack items. Low-sodium cereals, oats, puffed wheat and rice, shredded wheat. English muffins and bread. Pasta. Meats  Avoid: Salted, canned, smoked, spiced, pickled meats, including fish and poultry. Bacon, ham, sausage, cold cuts, hot dogs, anchovies.  Choose: Low-sodium canned tuna and salmon. Fresh or frozen meat, poultry, and fish. Dairy  Avoid: Processed cheese and spreads. Cottage cheese. Buttermilk and condensed milk. Regular cheese.  Choose: Milk. Low-sodium cottage cheese. Yogurt. Sour cream. Low-sodium cheese. Fruits and Vegetables  Avoid: Regular canned vegetables. Regular canned tomato sauce and paste. Frozen vegetables in sauces. Olives. Angie Fava. Relishes. Sauerkraut.  Choose: Low-sodium canned vegetables. Low-sodium tomato sauce and paste. Frozen or fresh vegetables. Fresh and frozen fruit. Condiments  Avoid: Canned and packaged gravies.  Worcestershire sauce. Tartar sauce. Barbecue sauce. Soy sauce. Steak sauce. Ketchup. Onion, garlic, and table salt. Meat flavorings and tenderizers.  Choose: Fresh and dried herbs and spices. Low-sodium varieties of mustard and ketchup. Lemon juice. Tabasco sauce. Horseradish. SAMPLE 2 GRAM SODIUM MEAL PLAN Breakfast / Sodium (mg)  1 cup low-fat milk / A999333 mg  2 slices whole-wheat toast / 270 mg  1 tbs heart-healthy margarine / 153 mg  1 hard-boiled egg  / 139 mg  1 small orange / 0 mg Lunch / Sodium (mg)  1 cup raw carrots / 76 mg   cup hummus / 298 mg  1 cup low-fat milk / 143 mg   cup red grapes / 2 mg  1 whole-wheat pita bread / 356 mg Dinner / Sodium (mg)  1 cup whole-wheat pasta / 2 mg  1 cup low-sodium tomato sauce / 73 mg  3 oz lean ground beef / 57 mg  1 small side salad (1 cup raw spinach leaves,  cup cucumber,  cup yellow bell pepper) with 1 tsp olive oil and 1 tsp red wine vinegar / 25 mg Snack / Sodium (mg)  1 container low-fat vanilla yogurt / 107 mg  3 graham cracker squares / 127 mg Nutrient Analysis  Calories: 2033  Protein: 77 g  Carbohydrate: 282 g  Fat: 72 g  Sodium: 1971 mg Document Released: 03/06/2005 Document Revised: 05/29/2011 Document Reviewed: 06/07/2009 Exit[employer] Patient Information 2014 [place].

## 2013-08-19 ENCOUNTER — Ambulatory Visit (INDEPENDENT_AMBULATORY_CARE_PROVIDER_SITE_OTHER): Payer: Medicare Other | Admitting: Physician Assistant

## 2013-08-19 ENCOUNTER — Encounter: Payer: Self-pay | Admitting: Physician Assistant

## 2013-08-19 VITALS — BP 138/66 | HR 68 | Ht 62.0 in | Wt 113.8 lb

## 2013-08-19 DIAGNOSIS — D509 Iron deficiency anemia, unspecified: Secondary | ICD-10-CM

## 2013-08-19 NOTE — Progress Notes (Signed)
Subjective:    Patient ID: Barbara Garner, female    DOB: January 22, 1935, 79 y.o.   MRN: [id number]  HPI Habibah is a 79 year old white female primary patient of Dr. Glori Bickers who is referred after hospitalization for evaluation of iron deficiency anemia. She is new to GI. She was hospitalized at [facility]  from 523 through 08/12/2013. She had acute on chronic respiratory failure secondary to CHF and also was noted to have a drop in her hemoglobin from baseline of 11 down to 7.7. She was documented to be Hemoccult negative, was given 2 units of packed rbc's and had a hemoglobin of 10.9 post transfusions. Iron studies were done showing serum iron of 15 TIBC of 178 and iron saturation of 8. Since discharge from the hospital she also had followup lab showing a hemoglobin of 12.1 yesterday. Patient reports being on iron supplements over the past year per her kidney specialist. She does have history of chronic kidney disease stage III for, congestive heart failure, status post remote aortic valve replacement with a tissue valve, history of atrial fibrillation, coronary artery disease-status post CABG,, diabetes mellitus, and complete heart block status post pacemaker. Patient is a poor historian but has no active GI complaints. She denies dysphagia or odynophagia heartburn indigestion no abdominal pain. She does have chronic constipation but has not noted any melena, hematochezia or changes in her bowel habits. Family history is negative for colon cancer, polyps as far she is aware Review of e chart records shows that she did have colonoscopy with Dr. Lajoyce Corners in  2006 -apparently done for history of colon polyps this was negative with the exception of internal hemorrhoids. She also had EGD done in 2004 with findings of gastritis. Biopsies showed mild chronic gastritis. Patient did not remember having these procedures done.   Review of Systems  Constitutional: Negative.   HENT: Negative.   Eyes: Negative.     Respiratory: Negative.   Cardiovascular: Negative.   Endocrine: Negative.   Genitourinary: Negative.   Musculoskeletal: Negative.   Allergic/Immunologic: Negative.   Neurological: Negative.   Hematological: Negative.   Psychiatric/Behavioral: Negative.    Outpatient Prescriptions Prior to Visit  Medication Sig Dispense Refill  . amLODipine (NORVASC) 10 MG tablet Take 10 mg by mouth daily.       Marland Kitchen aspirin EC 81 MG EC tablet Take 1 tablet (81 mg total) by mouth daily.  30 tablet  3  . atorvastatin (LIPITOR) 40 MG tablet Take 40 mg by mouth daily.      . carvedilol (COREG) 12.5 MG tablet Take 18.75 mg by mouth 2 (two) times daily with a meal.      . Cholecalciferol (VITAMIN D-3) 1000 UNITS CAPS Take 1 capsule by mouth daily.      . ferrous gluconate (FERGON) 324 MG tablet Take 1 tablet (324 mg total) by mouth daily with breakfast.  30 tablet  3  . fluticasone (FLONASE) 50 MCG/ACT nasal spray Place 1 spray into both nostrils daily.      . furosemide (LASIX) 20 MG tablet Take 1 tablet (20 mg total) by mouth daily.  30 tablet    . glipiZIDE (GLUCOTROL XL) 10 MG 24 hr tablet Take 10 mg by mouth 2 (two) times daily.      Marland Kitchen levofloxacin (LEVAQUIN) 500 MG tablet Take 1 tablet (500 mg total) by mouth every other day. X 3 more doses  3 tablet  0  . levothyroxine (SYNTHROID, LEVOTHROID) 50 MCG tablet Take 50 mcg  by mouth daily before breakfast.      . omeprazole (PRILOSEC) 20 MG capsule Take 20 mg by mouth daily.      Marland Kitchen tolterodine (DETROL LA) 2 MG 24 hr capsule Take 1 capsule (2 mg total) by mouth daily.  30 capsule  5   No facility-administered medications prior to visit.   Allergies  Allergen Reactions  . Metformin     REACTION: intolerant  . Promethazine Hcl     REACTION: u/k   Patient Active Problem List   Diagnosis Date Noted  . Acute and chronic respiratory failure 08/09/2013  . Acute respiratory failure 08/09/2013  . Urge incontinence of urine 07/16/2013  . Urine incontinence  02/07/2013  . Urinary frequency 02/07/2013  . UTI (urinary tract infection) 02/07/2013  . Sinus bradycardia 09/24/2012  . Vitamin D deficiency disease 08/23/2012  . Lichen sclerosus et atrophicus of the vulva 04/05/2012  . Acute back pain 03/22/2012  . Chronic diastolic CHF (congestive heart failure) 11/15/2011  . Pacemaker-Medtronic 09/13/2011  . Atrial fibrillation 09/12/2011  . Complete heart block-intermittent 09/09/2011  . S/P aortic valve replacement 09/06/2011  . S/P CABG x 3 09/06/2011  . IVCD (intraventricular conduction defect)   . CKD (chronic kidney disease) stage 4, GFR 15-29 ml/min   . Carotid stenosis 05/30/2011  . Squamous cell carcinoma of skin 07/21/2010  . INTERTRIGO, CANDIDAL 03/24/2010  . MEMORY LOSS 08/19/2009  . HYPERCHOLESTEROLEMIA, PURE 09/20/2006  . HYPOTHYROIDISM 06/27/2006  . DIABETES MELLITUS, TYPE II 06/27/2006  . ANEMIA-IRON DEFICIENCY 06/27/2006  . PERIPHERAL NEUROPATHY 06/27/2006  . HYPERTENSION 06/27/2006  . MYOCARDIAL INFARCTION, HX OF 06/27/2006  . CORONARY ARTERY DISEASE 06/27/2006  . ASTHMA 06/27/2006  . GERD 06/27/2006  . PNEUMONIA, HX OF 06/27/2006  . ECZEMA 06/27/2006   History  Substance Use Topics  . Smoking status: Never Smoker   . Smokeless tobacco: Never Used  . Alcohol Use: No      family history includes Stroke in her father and mother.  Objective:   Physical Exam well-developed elderly white female in no acute distress, accompanied by her friend blood pressure 138/66 pulse 68 height 5 foot 2 weight 113. HEENT ;nontraumatic normocephalic EOMI PERRLA sclera anicteric, Supple; no JVD, Cardiovascular; irregular rate and rhythm with S1-S2 she has a soft systolic murmur, Pulmonary ;clear bilaterally, Abdomen; soft nontender nondistended bowel sounds are active there is no palpable mass or hepatosplenomegaly, Rectal; exam not done, Extremities; no clubbing cyanosis or edema skin warm and dry, Psych ;mood and affect  appropriate        Assessment & Plan:  #48 79 year old female with chronic iron deficiency anemia which is likely an anemia of chronic disease. Patient with recent hospitalization and associated drop in hemoglobin without evidence for GI bleeding stool Hemoccult-negative at that time. She has not had endoscopic evaluation since 2006 and did have a chronic gastritis. Rule out occult GI blood loss though suspect this is more on the basis of chronic disease #2 status post aortic valve replacement tissue valve #3 history of complete heart block status post pacemaker #4 coronary artery disease status post CABG #5 congestive heart failure #6 chronic kidney disease stage III to 4 #7 atrial fibrillation #8 diabetes mellitus  Plan; continue oral iron supplement patient is currently on ferrous gluconate Have scheduled for colonoscopy and EGD with Dr. Ardis Hughs. Procedures were discussed in detail with the patient and she is agreeable to proceed. If endoscopic evaluation is negative she may benefit from hematology consultation and consideration of IV  iron

## 2013-08-19 NOTE — Patient Instructions (Signed)
You have been scheduled for an endoscopy and colonoscopy with propofol. Please follow the written instructions given to you at your visit today.  If you use inhalers (even only as needed), please bring them with you on the day of your procedure. Your physician has requested that you go to www.startemmi.com and enter the access code given to you at your visit today. This web site gives a general overview about your procedure. However, you should still follow specific instructions given to you by our office regarding your preparation for the procedure.

## 2013-08-20 NOTE — Progress Notes (Signed)
I agree with the above plan 

## 2013-08-22 ENCOUNTER — Encounter: Payer: Self-pay | Admitting: Gastroenterology

## 2013-08-22 ENCOUNTER — Emergency Department (HOSPITAL_COMMUNITY)
Admission: EM | Admit: 2013-08-22 | Discharge: 2013-08-22 | Disposition: A | Discharge status: Home / Self Care | Payer: Medicare Other | Attending: Emergency Medicine | Admitting: Emergency Medicine

## 2013-08-22 ENCOUNTER — Encounter (HOSPITAL_COMMUNITY): Payer: Self-pay | Admitting: Emergency Medicine

## 2013-08-22 DIAGNOSIS — E039 Hypothyroidism, unspecified: Secondary | ICD-10-CM | POA: Insufficient documentation

## 2013-08-22 DIAGNOSIS — Z862 Personal history of diseases of the blood and blood-forming organs and certain disorders involving the immune mechanism: Secondary | ICD-10-CM | POA: Insufficient documentation

## 2013-08-22 DIAGNOSIS — Z7982 Long term (current) use of aspirin: Secondary | ICD-10-CM | POA: Insufficient documentation

## 2013-08-22 DIAGNOSIS — I509 Heart failure, unspecified: Secondary | ICD-10-CM | POA: Insufficient documentation

## 2013-08-22 DIAGNOSIS — J45909 Unspecified asthma, uncomplicated: Secondary | ICD-10-CM | POA: Insufficient documentation

## 2013-08-22 DIAGNOSIS — E119 Type 2 diabetes mellitus without complications: Secondary | ICD-10-CM | POA: Insufficient documentation

## 2013-08-22 DIAGNOSIS — Z951 Presence of aortocoronary bypass graft: Secondary | ICD-10-CM | POA: Insufficient documentation

## 2013-08-22 DIAGNOSIS — I129 Hypertensive chronic kidney disease with stage 1 through stage 4 chronic kidney disease, or unspecified chronic kidney disease: Secondary | ICD-10-CM | POA: Insufficient documentation

## 2013-08-22 DIAGNOSIS — Z950 Presence of cardiac pacemaker: Secondary | ICD-10-CM | POA: Insufficient documentation

## 2013-08-22 DIAGNOSIS — IMO0002 Reserved for concepts with insufficient information to code with codable children: Secondary | ICD-10-CM | POA: Insufficient documentation

## 2013-08-22 DIAGNOSIS — Z9861 Coronary angioplasty status: Secondary | ICD-10-CM | POA: Insufficient documentation

## 2013-08-22 DIAGNOSIS — I5032 Chronic diastolic (congestive) heart failure: Secondary | ICD-10-CM | POA: Insufficient documentation

## 2013-08-22 DIAGNOSIS — I2510 Atherosclerotic heart disease of native coronary artery without angina pectoris: Secondary | ICD-10-CM | POA: Insufficient documentation

## 2013-08-22 DIAGNOSIS — Z79899 Other long term (current) drug therapy: Secondary | ICD-10-CM | POA: Insufficient documentation

## 2013-08-22 DIAGNOSIS — N184 Chronic kidney disease, stage 4 (severe): Secondary | ICD-10-CM | POA: Insufficient documentation

## 2013-08-22 DIAGNOSIS — Z792 Long term (current) use of antibiotics: Secondary | ICD-10-CM | POA: Insufficient documentation

## 2013-08-22 DIAGNOSIS — Z9889 Other specified postprocedural states: Secondary | ICD-10-CM | POA: Insufficient documentation

## 2013-08-22 DIAGNOSIS — L84 Corns and callosities: Secondary | ICD-10-CM | POA: Insufficient documentation

## 2013-08-22 DIAGNOSIS — K219 Gastro-esophageal reflux disease without esophagitis: Secondary | ICD-10-CM | POA: Insufficient documentation

## 2013-08-22 DIAGNOSIS — E7800 Pure hypercholesterolemia, unspecified: Secondary | ICD-10-CM | POA: Insufficient documentation

## 2013-08-22 DIAGNOSIS — Z872 Personal history of diseases of the skin and subcutaneous tissue: Secondary | ICD-10-CM | POA: Insufficient documentation

## 2013-08-22 MED ORDER — CEPHALEXIN 500 MG PO CAPS
500.0000 mg | ORAL_CAPSULE | Freq: Once | ORAL | Status: AC
  Administered 2013-08-22: 500 mg via ORAL
  Filled 2013-08-22: qty 1

## 2013-08-22 MED ORDER — TRAMADOL HCL 50 MG PO TABS: 50.0000 mg | ORAL_TABLET | Freq: Four times a day (QID) | ORAL | Status: DC | PRN

## 2013-08-22 MED ORDER — CEPHALEXIN 500 MG PO CAPS: 500.0000 mg | ORAL_CAPSULE | Freq: Four times a day (QID) | ORAL | Status: DC

## 2013-08-22 MED ORDER — TRAMADOL HCL 50 MG PO TABS
50.0000 mg | ORAL_TABLET | Freq: Once | ORAL | Status: AC
  Administered 2013-08-22: 50 mg via ORAL
  Filled 2013-08-22: qty 1

## 2013-08-22 NOTE — ED Notes (Signed)
\  [id number]

## 2013-08-22 NOTE — ED Provider Notes (Signed)
CSN: [id number]     Arrival date & time 08/22/13  2059 History   First MD Initiated Contact with Patient 08/22/13 2129     Chief Complaint  Patient presents with  . Foot Pain      HPI  Patient presents with a sore swollen area on the outside of her left foot. CT head area there for several months become more painful over the last week. His never seen a physician for it before. Slightly red. Streaks on the foot. No fevers chills. No injury to the area.  Past Medical History  Diagnosis Date  . Iron deficiency anemia, unspecified   . Chronic diastolic CHF (congestive heart failure)   . CAD  S/P CABG x 3 08/2011     LIMA to diagonal branch, SVG to OM1, SVG to PDA, EVH via bilateral thighs  . Type II or unspecified type diabetes mellitus without mention of complication, not stated as uncontrolled   . Contact dermatitis and other eczema, due to unspecified cause   . Esophageal reflux   . Headache(784.0)   . Pacemaker MDT dual chamber     DOI 08/2011  . Pure hypercholesterolemia   . hypertension   . Hypothyroidism   . Atrioventricular block, complete -intermittent   . Memory loss   . Idiopathic peripheral neuropathy   . LBBB (left bundle branch block)   . Carotid stenosis     Carotid dopplers 0000000 with [id number] LICA stenosis  . Severe aortic stenosis -S/P AVR      19 mm [facility] Ease pericardial tissue valve  . Extrinsic asthma, unspecified     hasn't used inhaler in past year  . CKD (chronic kidney disease) stage 4, GFR 15-29 ml/min     Cr 1.6 in 6/11, sees Dr. Arty Baumgartner  . Impaired vision     pt. reports that she identifies her meds by looking at the pillls, she is not able to read labels on bottles   Past Surgical History  Procedure Laterality Date  . Ptca  [id number]    5 blockages  . Cardiac catheterization    . Esophageal dilation  1997  . Colonoscopy  04/1999    1 polyp  . Esophagogastroduodenoscopy  04/1999    HH; "watermelon stomach" (severe gastritis)  . Dexa   10/2003 ,09/2005    osteoporosis,  Osteopenia  . Cataract extraction, bilateral  2003  . Esophagogastroduodenoscopy  04/2002    Gastritis  . Cardiovascular stress test  10/2002    normal (per patient)  . Abdominal hysterectomy  1977    partial; fibroids  . Bladder tack  1977  . Colonoscopy  06/2004    Neg. Int hem  . Carotid dopplers  2006  . Refractive surgery  2006  . Opthy  11/1998;12/01;11/02  . Aortic valve replacement  09/06/2011    Procedure: AORTIC VALVE REPLACEMENT (AVR);  Surgeon: Rexene Alberts, MD;  Location: [place];  Service: Open Heart Surgery;  Laterality: N/A;  . Coronary artery bypass graft  09/06/2011    Procedure: CORONARY ARTERY BYPASS GRAFTING (CABG);  Surgeon: Rexene Alberts, MD;  Location: [place];  Service: Open Heart Surgery;  Laterality: N/A;   Family History  Problem Relation Age of Onset  . Stroke Father     died in his 54's  . Stroke Mother     died in her 37's   History  Substance Use Topics  . Smoking status: Never Smoker   . Smokeless tobacco: Never Used  .  Alcohol Use: No   OB History   Grav Para Term Preterm Abortions TAB SAB Ect Mult Living                 Review of Systems  Constitutional: Negative for fever, chills, diaphoresis, appetite change and fatigue.  HENT: Negative for mouth sores, sore throat and trouble swallowing.   Eyes: Negative for visual disturbance.  Respiratory: Negative for cough, chest tightness, shortness of breath and wheezing.   Cardiovascular: Negative for chest pain.  Gastrointestinal: Negative for nausea, vomiting, abdominal pain, diarrhea and abdominal distention.  Endocrine: Negative for polydipsia, polyphagia and polyuria.  Genitourinary: Negative for dysuria, frequency and hematuria.  Musculoskeletal: Negative for gait problem.  Skin: Positive for color change. Negative for pallor and rash.       Thickened skin on the lateral left foot  Neurological: Negative for dizziness, syncope, light-headedness and  headaches.  Hematological: Does not bruise/bleed easily.  Psychiatric/Behavioral: Negative for behavioral problems and confusion.      Allergies  Metformin and Promethazine hcl  Home Medications   Prior to Admission medications   Medication Sig Start Date End Date Taking? Authorizing Provider  amLODipine (NORVASC) 10 MG tablet Take 10 mg by mouth daily.    Yes Historical Provider, MD  aspirin EC 81 MG EC tablet Take 1 tablet (81 mg total) by mouth daily. 08/12/13  Yes Ripudeep Krystal Eaton, MD  atorvastatin (LIPITOR) 40 MG tablet Take 40 mg by mouth daily.   Yes Historical Provider, MD  carvedilol (COREG) 12.5 MG tablet Take 18.75 mg by mouth 2 (two) times daily with a meal.   Yes Historical Provider, MD  Cholecalciferol (VITAMIN D-3) 1000 UNITS CAPS Take 1 capsule by mouth daily.   Yes Historical Provider, MD  ferrous gluconate (FERGON) 324 MG tablet Take 1 tablet (324 mg total) by mouth daily with breakfast. 08/12/13  Yes Ripudeep K Rai, MD  fluticasone (FLONASE) 50 MCG/ACT nasal spray Place 1 spray into both nostrils daily.   Yes Historical Provider, MD  furosemide (LASIX) 20 MG tablet Take 1 tablet (20 mg total) by mouth daily. 08/13/13  Yes Ripudeep Krystal Eaton, MD  glipiZIDE (GLUCOTROL XL) 10 MG 24 hr tablet Take 10 mg by mouth 2 (two) times daily.   Yes Historical Provider, MD  levothyroxine (SYNTHROID, LEVOTHROID) 50 MCG tablet Take 50 mcg by mouth daily before breakfast.   Yes Historical Provider, MD  omeprazole (PRILOSEC) 20 MG capsule Take 20 mg by mouth daily.   Yes Historical Provider, MD  tolterodine (DETROL LA) 2 MG 24 hr capsule Take 1 capsule (2 mg total) by mouth daily. 07/16/13  Yes Abner Greenspan, MD  cephALEXin (KEFLEX) 500 MG capsule Take 1 capsule (500 mg total) by mouth 4 (four) times daily. 08/22/13   Tanna Furry, MD  traMADol (ULTRAM) 50 MG tablet Take 1 tablet (50 mg total) by mouth every 6 (six) hours as needed. 08/22/13   Tanna Furry, MD   BP 164/62  Pulse 68  Temp(Src) 97.9 F  (36.6 C) (Oral)  Resp 15  Ht 5' (1.524 m)  Wt 114 lb (51.71 kg)  BMI 22.26 kg/m2  SpO2 96% Physical Exam  Musculoskeletal:       Feet:    ED Course  Procedures (including critical care time) Labs Review Labs Reviewed - No data to display  Imaging Review No results found.   EKG Interpretation None      MDM   Final diagnoses:  Corn infected  Minimally erythematous. We'll place her on some Keflex. However, think most discomfort is due to simple size. Refer her to her primary care physician for care, and possible podiatry evaluation.    Tanna Furry, MD 08/22/13 2212

## 2013-08-22 NOTE — ED Notes (Signed)
Pt c/o L bunion pain, pain worse today.

## 2013-08-22 NOTE — Discharge Instructions (Signed)

## 2013-08-25 ENCOUNTER — Ambulatory Visit (HOSPITAL_COMMUNITY): Payer: Medicare Other | Attending: Cardiology | Admitting: Cardiology

## 2013-08-25 DIAGNOSIS — I2510 Atherosclerotic heart disease of native coronary artery without angina pectoris: Secondary | ICD-10-CM | POA: Insufficient documentation

## 2013-08-25 DIAGNOSIS — I10 Essential (primary) hypertension: Secondary | ICD-10-CM | POA: Insufficient documentation

## 2013-08-25 DIAGNOSIS — I658 Occlusion and stenosis of other precerebral arteries: Secondary | ICD-10-CM | POA: Insufficient documentation

## 2013-08-25 DIAGNOSIS — I6529 Occlusion and stenosis of unspecified carotid artery: Secondary | ICD-10-CM

## 2013-08-25 DIAGNOSIS — E119 Type 2 diabetes mellitus without complications: Secondary | ICD-10-CM | POA: Insufficient documentation

## 2013-08-25 DIAGNOSIS — Z951 Presence of aortocoronary bypass graft: Secondary | ICD-10-CM | POA: Insufficient documentation

## 2013-08-25 DIAGNOSIS — E785 Hyperlipidemia, unspecified: Secondary | ICD-10-CM | POA: Insufficient documentation

## 2013-08-25 NOTE — Progress Notes (Signed)
Carotid duplex performed 

## 2013-08-26 ENCOUNTER — Telehealth: Payer: Self-pay | Admitting: *Deleted

## 2013-08-26 NOTE — Telephone Encounter (Signed)
Dr. Glori Bickers sent me a message saying  "Let pt know I read her ER note- if pain in foot does not improve please let me know - we may want to get her to a podiatrist to take care of the corn"  Called pt and she said that the corn is still giving her some problems but it is feeling a little better since leaving the hospital. Pt said she already has an appt with a foot doctor so she will just wait and see what they say. Pt said she is okay for now

## 2013-09-03 ENCOUNTER — Ambulatory Visit (AMBULATORY_SURGERY_CENTER): Payer: Medicare Other | Admitting: Gastroenterology

## 2013-09-03 ENCOUNTER — Encounter: Payer: Self-pay | Admitting: Gastroenterology

## 2013-09-03 VITALS — BP 140/72 | HR 78 | Temp 98.5°F | Resp 13 | Ht 62.0 in | Wt 113.0 lb

## 2013-09-03 DIAGNOSIS — K2940 Chronic atrophic gastritis without bleeding: Secondary | ICD-10-CM

## 2013-09-03 DIAGNOSIS — K2970 Gastritis, unspecified, without bleeding: Secondary | ICD-10-CM

## 2013-09-03 DIAGNOSIS — K5730 Diverticulosis of large intestine without perforation or abscess without bleeding: Secondary | ICD-10-CM

## 2013-09-03 DIAGNOSIS — D509 Iron deficiency anemia, unspecified: Secondary | ICD-10-CM

## 2013-09-03 DIAGNOSIS — K2990 Gastroduodenitis, unspecified, without bleeding: Secondary | ICD-10-CM

## 2013-09-03 LAB — GLUCOSE, CAPILLARY
Glucose-Capillary: 55 mg/dL — ABNORMAL LOW (ref 70–99)
Glucose-Capillary: 183 mg/dL — ABNORMAL HIGH (ref 70–99)
Glucose-Capillary: 214 mg/dL — ABNORMAL HIGH (ref 70–99)

## 2013-09-03 MED ORDER — DEXTROSE 5 % IV SOLN: INTRAVENOUS | Status: DC

## 2013-09-03 NOTE — Progress Notes (Signed)
CBG=55 UPON Arrival, d5w and 34ml dextrose given CBG Rechecked =183. Pt. Denies s/s of hypoglycemia.

## 2013-09-03 NOTE — Op Note (Signed)
[place]  [address] [place], [address]   COLONOSCOPY PROCEDURE REPORT  PATIENT: Barbara Garner, Barbara Garner  MR#: [id number] BIRTHDATE: 06-27-1934 , 79  yrs. old GENDER: Female ENDOSCOPIST: Milus Banister, MD REFERRED [id number] Vernell Morgans, M.D. PROCEDURE DATE:  09/03/2013 PROCEDURE:   Colonoscopy, diagnostic First Screening Colonoscopy - Avg.  risk and is 50 yrs.  old or older - No.  Prior Negative Screening - Now for repeat screening. 10 or more years since last screening  History of Adenoma - Now for follow-up colonoscopy & has been > or = to 3 yrs.  N/A  Polyps Removed Today? No.  Recommend repeat exam, <10 yrs? No. ASA CLASS:   Class III INDICATIONS:Iron def anemia (hemocult negative). MEDICATIONS: MAC sedation, administered by CRNA and Propofol (Diprivan) 140 mg IV  DESCRIPTION OF PROCEDURE:   After the risks benefits and alternatives of the procedure were thoroughly explained, informed consent was obtained.  A digital rectal exam revealed no abnormalities of the rectum.   The LB [id number]  endoscope was introduced through the anus and advanced to the cecum, which was identified by both the appendix and ileocecal valve. No adverse events experienced.   The quality of the prep was excellent.  The instrument was then slowly withdrawn as the colon was fully examined.   COLON FINDINGS: There were numerous diverticulum in the left colon. The examination was otherwise normal.  Retroflexed views revealed no abnormalities. The time to cecum=4 minutes 07 seconds. Withdrawal time=5 minutes 22 seconds.  The scope was withdrawn and the procedure completed. COMPLICATIONS: There were no complications.  ENDOSCOPIC IMPRESSION: There were numerous diverticulum in the left colon. The examination was otherwise normal.  No polyps or cancers.  RECOMMENDATIONS: Given your age, you will not need another colonoscopy for colon cancer screening or polyp surveillance.  These  types of tests usually stop around the age 52. Will proceed with EGD now to continue GI workup of your iron deficiency anemia.  eSigned:  Milus Banister, MD 09/03/2013 2:02 PM

## 2013-09-03 NOTE — Progress Notes (Signed)
Procedure ends, to recovery, report given and VSS. 

## 2013-09-03 NOTE — Progress Notes (Signed)
Called to room to assist during endoscopic procedure.  Patient ID and intended procedure confirmed with present staff. Received instructions for my participation in the procedure from the performing physician.  

## 2013-09-03 NOTE — Op Note (Signed)
[place]  [address]. [place], [address]   ENDOSCOPY PROCEDURE REPORT  PATIENT: Barbara Garner, Barbara Garner  MR#: [id number] BIRTHDATE: [birth year] , 79  yrs. old GENDER: Female ENDOSCOPIST: Milus Banister, MD PROCEDURE DATE:  09/03/2013 PROCEDURE:  EGD w/ biopsy ASA CLASS:     Class III INDICATIONS:  Iron deficiency anemia, FOBT negative. MEDICATIONS: MAC sedation, administered by CRNA and Propofol (Diprivan) 120 mg IV TOPICAL ANESTHETIC: none  DESCRIPTION OF PROCEDURE: After the risks benefits and alternatives of the procedure were thoroughly explained, informed consent was obtained.  The LB [id number] endoscope was introduced through the mouth and advanced to the second portion of the duodenum. Without limitations.  The instrument was slowly withdrawn as the mucosa was fully examined.   There was moderate, non-specific pangastritis.  The distal stomach was biopsied and sent to pathology.  The duodenum was normal, biopsied and sent to pathology.  There was a 4-5cm hiatal hernia without Cameron's erosions. The examination was otherwise normal. Retroflexed views revealed no abnormalities.     The scope was then withdrawn from the patient and the procedure completed. COMPLICATIONS: There were no complications.  ENDOSCOPIC IMPRESSION: There was moderate, non-specific pangastritis.  The distal stomach was biopsied and sent to pathology.  The duodenum was normal, biopsied and sent to pathology.  There was a 4-5cm hiatal hernia without Cameron's erosions.  RECOMMENDATIONS: Await final pathology results.  You should stay on once daily iron supplements for now.    eSigned:  Milus Banister, MD 09/03/2013 2:12 PM   CC: Loura Pardon, MD

## 2013-09-03 NOTE — Patient Instructions (Signed)
Discharge instructions given with verbal understanding. Handouts on diverticulosis and a high fiber diet. Biopsies taken. Resume previous medications. YOU HAD AN ENDOSCOPIC PROCEDURE TODAY AT [facility]: Refer to the procedure report that was given to you for any specific questions about what was found during the examination.  If the procedure report does not answer your questions, please call your gastroenterologist to clarify.  If you requested that your care partner not be given the details of your procedure findings, then the procedure report has been included in a sealed envelope for you to review at your convenience later.  YOU SHOULD EXPECT: Some feelings of bloating in the abdomen. Passage of more gas than usual.  Walking can help get rid of the air that was put into your GI tract during the procedure and reduce the bloating. If you had a lower endoscopy (such as a colonoscopy or flexible sigmoidoscopy) you may notice spotting of blood in your stool or on the toilet paper. If you underwent a bowel prep for your procedure, then you may not have a normal bowel movement for a few days.  DIET: Your first meal following the procedure should be a light meal and then it is ok to progress to your normal diet.  A half-sandwich or bowl of soup is an example of a good first meal.  Heavy or fried foods are harder to digest and may make you feel nauseous or bloated.  Likewise meals heavy in dairy and vegetables can cause extra gas to form and this can also increase the bloating.  Drink plenty of fluids but you should avoid alcoholic beverages for 24 hours.  ACTIVITY: Your care partner should take you home directly after the procedure.  You should plan to take it easy, moving slowly for the rest of the day.  You can resume normal activity the day after the procedure however you should NOT DRIVE or use heavy machinery for 24 hours (because of the sedation medicines used during the test).     SYMPTOMS TO REPORT IMMEDIATELY: A gastroenterologist can be reached at any hour.  During normal business hours, 8:30 AM to 5:00 PM Monday through Friday, call [phone].  After hours and on weekends, please call the GI answering service at [phone] who will take a message and have the physician on call contact you.   Following lower endoscopy (colonoscopy or flexible sigmoidoscopy):  Excessive amounts of blood in the stool  Significant tenderness or worsening of abdominal pains  Swelling of the abdomen that is new, acute  Fever of 100F or higher  Following upper endoscopy (EGD)  Vomiting of blood or coffee ground material  New chest pain or pain under the shoulder blades  Painful or persistently difficult swallowing  New shortness of breath  Fever of 100F or higher  Black, tarry-looking stools  FOLLOW UP: If any biopsies were taken you will be contacted by phone or by letter within the next 1-3 weeks.  Call your gastroenterologist if you have not heard about the biopsies in 3 weeks.  Our staff will call the home number listed on your records the next business day following your procedure to check on you and address any questions or concerns that you may have at that time regarding the information given to you following your procedure. This is a courtesy call and so if there is no answer at the home number and we have not heard from you through the emergency physician on call, we  will assume that you have returned to your regular daily activities without incident.  SIGNATURES/CONFIDENTIALITY: You and/or your care partner have signed paperwork which will be entered into your electronic medical record.  These signatures attest to the fact that that the information above on your After Visit Summary has been reviewed and is understood.  Full responsibility of the confidentiality of this discharge information lies with you and/or your care-partner.

## 2013-09-04 ENCOUNTER — Telehealth: Payer: Self-pay

## 2013-09-04 NOTE — Telephone Encounter (Signed)
  Follow up Call-  Call back number 09/03/2013  Post procedure Call Back phone  # [phone]     Unable to leave message permission not given.

## 2013-09-10 ENCOUNTER — Other Ambulatory Visit: Payer: Medicare Other

## 2013-09-11 ENCOUNTER — Encounter: Payer: Self-pay | Admitting: Gastroenterology

## 2013-09-15 ENCOUNTER — Telehealth: Payer: Self-pay | Admitting: Cardiology

## 2013-09-15 MED ORDER — CARVEDILOL 12.5 MG PO TABS: 12.5000 mg | ORAL_TABLET | Freq: Two times a day (BID) | ORAL | Status: DC

## 2013-09-15 NOTE — Telephone Encounter (Signed)
Pt's son, [name] states pt is now living with him.  He is not sure if pt has been taking amlodipine at all. It was last refilled more than a year ago.  He is not sure if pt has been taking coreg correctly or at all. If she had been taking it correctly she should have refilled it in April. She is just now needing a refill.  He was told that pt had been taking coreg 12.5mg  two in the AM and one in the PM. For the last week since pt has been living with him pt has been taking 18.75mg  two times a day as prescribed.  Pt has been SOB the last 2 nights. Pt's son is asking if pt had not been taking coreg at all or incorrectly and she restarted coreg 18.75mg  a week ago if this is the reason for her SOB. Son does not know BP, HR, or weight. He did say she has a history of asthma and used her son's inhaler and this helped.  Son asking if pt should refill amlodipine 10mg  daily and coreg 18.75mg  two times a day.   I will forward to Dr Aundra Dubin for review and recommendations.

## 2013-09-15 NOTE — Telephone Encounter (Signed)
Pt's son, [name] advised, verbalized understanding.

## 2013-09-15 NOTE — Telephone Encounter (Signed)
New message  Pt daughter called to discuss medication dosages to be sure that she is taking the correct amount of medication. Please call back to discuss.

## 2013-09-15 NOTE — Telephone Encounter (Signed)
Take coreg 12.5 mg bid for now and hold amlodipine.  Have her followup in office.

## 2013-09-16 ENCOUNTER — Inpatient Hospital Stay (HOSPITAL_BASED_OUTPATIENT_CLINIC_OR_DEPARTMENT_OTHER)
Admission: EM | Admit: 2013-09-16 | Discharge: 2013-09-18 | DRG: 193 | Disposition: A | Discharge status: Home / Self Care | Payer: Medicare Other | Attending: Internal Medicine | Admitting: Internal Medicine

## 2013-09-16 ENCOUNTER — Emergency Department (HOSPITAL_BASED_OUTPATIENT_CLINIC_OR_DEPARTMENT_OTHER): Payer: Medicare Other

## 2013-09-16 ENCOUNTER — Encounter (HOSPITAL_BASED_OUTPATIENT_CLINIC_OR_DEPARTMENT_OTHER): Payer: Self-pay | Admitting: Emergency Medicine

## 2013-09-16 DIAGNOSIS — Z9861 Coronary angioplasty status: Secondary | ICD-10-CM

## 2013-09-16 DIAGNOSIS — J45909 Unspecified asthma, uncomplicated: Secondary | ICD-10-CM | POA: Diagnosis present

## 2013-09-16 DIAGNOSIS — D509 Iron deficiency anemia, unspecified: Secondary | ICD-10-CM | POA: Diagnosis present

## 2013-09-16 DIAGNOSIS — I442 Atrioventricular block, complete: Secondary | ICD-10-CM | POA: Diagnosis present

## 2013-09-16 DIAGNOSIS — E119 Type 2 diabetes mellitus without complications: Secondary | ICD-10-CM | POA: Diagnosis present

## 2013-09-16 DIAGNOSIS — E7800 Pure hypercholesterolemia, unspecified: Secondary | ICD-10-CM | POA: Diagnosis present

## 2013-09-16 DIAGNOSIS — E039 Hypothyroidism, unspecified: Secondary | ICD-10-CM | POA: Diagnosis present

## 2013-09-16 DIAGNOSIS — H547 Unspecified visual loss: Secondary | ICD-10-CM | POA: Diagnosis present

## 2013-09-16 DIAGNOSIS — N390 Urinary tract infection, site not specified: Secondary | ICD-10-CM

## 2013-09-16 DIAGNOSIS — K219 Gastro-esophageal reflux disease without esophagitis: Secondary | ICD-10-CM | POA: Diagnosis present

## 2013-09-16 DIAGNOSIS — I5032 Chronic diastolic (congestive) heart failure: Secondary | ICD-10-CM

## 2013-09-16 DIAGNOSIS — J189 Pneumonia, unspecified organism: Principal | ICD-10-CM | POA: Diagnosis present

## 2013-09-16 DIAGNOSIS — I129 Hypertensive chronic kidney disease with stage 1 through stage 4 chronic kidney disease, or unspecified chronic kidney disease: Secondary | ICD-10-CM | POA: Diagnosis present

## 2013-09-16 DIAGNOSIS — N904 Leukoplakia of vulva: Secondary | ICD-10-CM

## 2013-09-16 DIAGNOSIS — R350 Frequency of micturition: Secondary | ICD-10-CM

## 2013-09-16 DIAGNOSIS — L259 Unspecified contact dermatitis, unspecified cause: Secondary | ICD-10-CM

## 2013-09-16 DIAGNOSIS — Z951 Presence of aortocoronary bypass graft: Secondary | ICD-10-CM

## 2013-09-16 DIAGNOSIS — Z952 Presence of prosthetic heart valve: Secondary | ICD-10-CM

## 2013-09-16 DIAGNOSIS — L538 Other specified erythematous conditions: Secondary | ICD-10-CM

## 2013-09-16 DIAGNOSIS — I454 Nonspecific intraventricular block: Secondary | ICD-10-CM

## 2013-09-16 DIAGNOSIS — I252 Old myocardial infarction: Secondary | ICD-10-CM

## 2013-09-16 DIAGNOSIS — Z950 Presence of cardiac pacemaker: Secondary | ICD-10-CM

## 2013-09-16 DIAGNOSIS — I5031 Acute diastolic (congestive) heart failure: Secondary | ICD-10-CM

## 2013-09-16 DIAGNOSIS — G609 Hereditary and idiopathic neuropathy, unspecified: Secondary | ICD-10-CM | POA: Diagnosis present

## 2013-09-16 DIAGNOSIS — Z7982 Long term (current) use of aspirin: Secondary | ICD-10-CM

## 2013-09-16 DIAGNOSIS — I509 Heart failure, unspecified: Secondary | ICD-10-CM | POA: Diagnosis present

## 2013-09-16 DIAGNOSIS — I6529 Occlusion and stenosis of unspecified carotid artery: Secondary | ICD-10-CM | POA: Diagnosis present

## 2013-09-16 DIAGNOSIS — I5033 Acute on chronic diastolic (congestive) heart failure: Secondary | ICD-10-CM | POA: Diagnosis present

## 2013-09-16 DIAGNOSIS — Z79899 Other long term (current) drug therapy: Secondary | ICD-10-CM

## 2013-09-16 DIAGNOSIS — R413 Other amnesia: Secondary | ICD-10-CM

## 2013-09-16 DIAGNOSIS — E559 Vitamin D deficiency, unspecified: Secondary | ICD-10-CM

## 2013-09-16 DIAGNOSIS — R001 Bradycardia, unspecified: Secondary | ICD-10-CM

## 2013-09-16 DIAGNOSIS — I10 Essential (primary) hypertension: Secondary | ICD-10-CM

## 2013-09-16 DIAGNOSIS — Z954 Presence of other heart-valve replacement: Secondary | ICD-10-CM

## 2013-09-16 DIAGNOSIS — C4492 Squamous cell carcinoma of skin, unspecified: Secondary | ICD-10-CM

## 2013-09-16 DIAGNOSIS — N184 Chronic kidney disease, stage 4 (severe): Secondary | ICD-10-CM | POA: Diagnosis present

## 2013-09-16 DIAGNOSIS — M549 Dorsalgia, unspecified: Secondary | ICD-10-CM

## 2013-09-16 DIAGNOSIS — I447 Left bundle-branch block, unspecified: Secondary | ICD-10-CM | POA: Diagnosis present

## 2013-09-16 DIAGNOSIS — I2510 Atherosclerotic heart disease of native coronary artery without angina pectoris: Secondary | ICD-10-CM | POA: Diagnosis present

## 2013-09-16 DIAGNOSIS — N3941 Urge incontinence: Secondary | ICD-10-CM

## 2013-09-16 DIAGNOSIS — J9620 Acute and chronic respiratory failure, unspecified whether with hypoxia or hypercapnia: Secondary | ICD-10-CM

## 2013-09-16 DIAGNOSIS — Z8709 Personal history of other diseases of the respiratory system: Secondary | ICD-10-CM

## 2013-09-16 LAB — URINALYSIS, ROUTINE W REFLEX MICROSCOPIC
BILIRUBIN URINE: NEGATIVE
Glucose, UA: NEGATIVE mg/dL
Hgb urine dipstick: NEGATIVE
Ketones, ur: NEGATIVE mg/dL
Nitrite: NEGATIVE
Protein, ur: NEGATIVE mg/dL
Specific Gravity, Urine: 1.009 (ref 1.005–1.030)
UROBILINOGEN UA: 0.2 mg/dL (ref 0.0–1.0)
pH: 5 (ref 5.0–8.0)

## 2013-09-16 LAB — COMPREHENSIVE METABOLIC PANEL
ALT: 15 U/L (ref 0–35)
AST: 16 U/L (ref 0–37)
Albumin: 3.6 g/dL (ref 3.5–5.2)
Alkaline Phosphatase: 103 U/L (ref 39–117)
BUN: 32 mg/dL — ABNORMAL HIGH (ref 6–23)
CALCIUM: 9.1 mg/dL (ref 8.4–10.5)
CO2: 24 mEq/L (ref 19–32)
Chloride: 102 mEq/L (ref 96–112)
Creatinine, Ser: 1.1 mg/dL (ref 0.50–1.10)
GFR calc non Af Amer: 47 mL/min — ABNORMAL LOW (ref >90)
GFR, EST AFRICAN AMERICAN: 55 mL/min — AB (ref >90)
Glucose, Bld: 196 mg/dL — ABNORMAL HIGH (ref 70–99)
Potassium: 4.2 mEq/L (ref 3.7–5.3)
Sodium: 140 mEq/L (ref 137–147)
TOTAL PROTEIN: 7.3 g/dL (ref 6.0–8.3)
Total Bilirubin: 0.6 mg/dL (ref 0.3–1.2)

## 2013-09-16 LAB — URINE MICROSCOPIC-ADD ON

## 2013-09-16 LAB — CBC WITH DIFFERENTIAL/PLATELET
BASOS ABS: 0 10*3/uL (ref 0.0–0.1)
BASOS PCT: 0 % (ref 0–1)
EOS PCT: 0 % (ref 0–5)
Eosinophils Absolute: 0 10*3/uL (ref 0.0–0.7)
HCT: 34.2 % — ABNORMAL LOW (ref 36.0–46.0)
HEMOGLOBIN: 11.3 g/dL — AB (ref 12.0–15.0)
LYMPHS ABS: 4 10*3/uL (ref 0.7–4.0)
LYMPHS PCT: 26 % (ref 12–46)
MCH: 29.8 pg (ref 26.0–34.0)
MCHC: 33 g/dL (ref 30.0–36.0)
MCV: 90.2 fL (ref 78.0–100.0)
MONOS PCT: 11 % (ref 3–12)
Monocytes Absolute: 1.7 10*3/uL — ABNORMAL HIGH (ref 0.1–1.0)
Myelocytes: 3 %
NEUTROS PCT: 60 % (ref 43–77)
Neutro Abs: 9.7 10*3/uL — ABNORMAL HIGH (ref 1.7–7.7)
PLATELETS: 224 10*3/uL (ref 150–400)
RBC: 3.79 MIL/uL — AB (ref 3.87–5.11)
RDW: 16.1 % — ABNORMAL HIGH (ref 11.5–15.5)
WBC: 15.4 10*3/uL — ABNORMAL HIGH (ref 4.0–10.5)

## 2013-09-16 LAB — GLUCOSE, CAPILLARY
GLUCOSE-CAPILLARY: 326 mg/dL — AB (ref 70–99)
Glucose-Capillary: 271 mg/dL — ABNORMAL HIGH (ref 70–99)

## 2013-09-16 LAB — TROPONIN I: Troponin I: <0.3 ng/mL (ref <0.30)

## 2013-09-16 LAB — PRO B NATRIURETIC PEPTIDE: PRO B NATRI PEPTIDE: 11854 pg/mL — AB (ref 0–450)

## 2013-09-16 LAB — PROTIME-INR
INR: 1 (ref 0.00–1.49)
Prothrombin Time: 13.2 seconds (ref 11.6–15.2)

## 2013-09-16 MED ORDER — FUROSEMIDE 10 MG/ML IJ SOLN
40.0000 mg | Freq: Every day | INTRAMUSCULAR | Status: DC
  Administered 2013-09-17 – 2013-09-18 (×2): 40 mg via INTRAVENOUS
  Filled 2013-09-16 (×2): qty 4

## 2013-09-16 MED ORDER — GUAIFENESIN-DM 100-10 MG/5ML PO SYRP
5.0000 mL | ORAL_SOLUTION | ORAL | Status: DC | PRN
  Filled 2013-09-16: qty 5

## 2013-09-16 MED ORDER — VANCOMYCIN HCL IN DEXTROSE 1-5 GM/200ML-% IV SOLN
1000.0000 mg | Freq: Once | INTRAVENOUS | Status: AC
  Administered 2013-09-16: 1000 mg via INTRAVENOUS
  Filled 2013-09-16: qty 200

## 2013-09-16 MED ORDER — CARVEDILOL 6.25 MG PO TABS
6.2500 mg | ORAL_TABLET | Freq: Two times a day (BID) | ORAL | Status: DC
  Administered 2013-09-16 – 2013-09-18 (×4): 6.25 mg via ORAL
  Filled 2013-09-16 (×6): qty 1

## 2013-09-16 MED ORDER — INSULIN ASPART 100 UNIT/ML ~~LOC~~ SOLN
3.0000 [IU] | Freq: Once | SUBCUTANEOUS | Status: AC
  Administered 2013-09-16: 3 [IU] via SUBCUTANEOUS

## 2013-09-16 MED ORDER — LEVOTHYROXINE SODIUM 50 MCG PO TABS
50.0000 ug | ORAL_TABLET | Freq: Every day | ORAL | Status: DC
  Administered 2013-09-17 – 2013-09-18 (×2): 50 ug via ORAL
  Filled 2013-09-16 (×3): qty 1

## 2013-09-16 MED ORDER — CEFEPIME HCL 1 G IJ SOLR
INTRAMUSCULAR | Status: AC
  Administered 2013-09-16: 14:00:00
  Filled 2013-09-16: qty 1

## 2013-09-16 MED ORDER — IPRATROPIUM BROMIDE 0.02 % IN SOLN
0.5000 mg | Freq: Four times a day (QID) | RESPIRATORY_TRACT | Status: DC
  Filled 2013-09-16: qty 2.5

## 2013-09-16 MED ORDER — ONDANSETRON HCL 4 MG/2ML IJ SOLN: 4.0000 mg | Freq: Four times a day (QID) | INTRAMUSCULAR | Status: DC | PRN

## 2013-09-16 MED ORDER — IPRATROPIUM-ALBUTEROL 0.5-2.5 (3) MG/3ML IN SOLN
3.0000 mL | Freq: Once | RESPIRATORY_TRACT | Status: AC
  Administered 2013-09-16: 3 mL via RESPIRATORY_TRACT

## 2013-09-16 MED ORDER — ACETAMINOPHEN 650 MG RE SUPP: 650.0000 mg | Freq: Four times a day (QID) | RECTAL | Status: DC | PRN

## 2013-09-16 MED ORDER — SODIUM CHLORIDE 0.9 % IJ SOLN
3.0000 mL | Freq: Two times a day (BID) | INTRAMUSCULAR | Status: DC
  Administered 2013-09-16 – 2013-09-18 (×4): 3 mL via INTRAVENOUS

## 2013-09-16 MED ORDER — LEVOFLOXACIN IN D5W 500 MG/100ML IV SOLN: 500.0000 mg | INTRAVENOUS | Status: DC

## 2013-09-16 MED ORDER — HYDROCODONE-ACETAMINOPHEN 5-325 MG PO TABS: 1.0000 | ORAL_TABLET | ORAL | Status: DC | PRN

## 2013-09-16 MED ORDER — ASPIRIN EC 81 MG PO TBEC
81.0000 mg | DELAYED_RELEASE_TABLET | Freq: Every day | ORAL | Status: DC
  Administered 2013-09-16 – 2013-09-18 (×3): 81 mg via ORAL
  Filled 2013-09-16 (×3): qty 1

## 2013-09-16 MED ORDER — GUAIFENESIN ER 600 MG PO TB12
1200.0000 mg | ORAL_TABLET | Freq: Two times a day (BID) | ORAL | Status: DC
Start: 2013-09-16 — End: 2013-09-18
  Administered 2013-09-16 – 2013-09-18 (×4): 1200 mg via ORAL
  Filled 2013-09-16 (×5): qty 2

## 2013-09-16 MED ORDER — ONDANSETRON HCL 4 MG PO TABS: 4.0000 mg | ORAL_TABLET | Freq: Four times a day (QID) | ORAL | Status: DC | PRN

## 2013-09-16 MED ORDER — SODIUM CHLORIDE 0.9 % IV SOLN
INTRAVENOUS | Status: DC
  Administered 2013-09-16: 1000 mL via INTRAVENOUS

## 2013-09-16 MED ORDER — VANCOMYCIN HCL IN DEXTROSE 750-5 MG/150ML-% IV SOLN: 750.0000 mg | INTRAVENOUS | Status: DC

## 2013-09-16 MED ORDER — FUROSEMIDE 10 MG/ML IJ SOLN
80.0000 mg | Freq: Once | INTRAMUSCULAR | Status: AC
Start: 2013-09-16 — End: 2013-09-16
  Administered 2013-09-16: 80 mg via INTRAVENOUS
  Filled 2013-09-16: qty 8

## 2013-09-16 MED ORDER — ALBUTEROL SULFATE (2.5 MG/3ML) 0.083% IN NEBU
2.5000 mg | INHALATION_SOLUTION | Freq: Four times a day (QID) | RESPIRATORY_TRACT | Status: DC
  Filled 2013-09-16: qty 3

## 2013-09-16 MED ORDER — ACETAMINOPHEN 325 MG PO TABS: 650.0000 mg | ORAL_TABLET | Freq: Four times a day (QID) | ORAL | Status: DC | PRN

## 2013-09-16 MED ORDER — ALBUTEROL SULFATE (2.5 MG/3ML) 0.083% IN NEBU: 2.5000 mg | INHALATION_SOLUTION | RESPIRATORY_TRACT | Status: DC | PRN

## 2013-09-16 MED ORDER — DEXTROSE 5 % IV SOLN: 1.0000 g | Freq: Once | INTRAVENOUS | Status: DC

## 2013-09-16 MED ORDER — INSULIN ASPART 100 UNIT/ML ~~LOC~~ SOLN
0.0000 [IU] | Freq: Three times a day (TID) | SUBCUTANEOUS | Status: DC
  Administered 2013-09-17: 2 [IU] via SUBCUTANEOUS
  Administered 2013-09-17: 9 [IU] via SUBCUTANEOUS

## 2013-09-16 MED ORDER — HEPARIN SODIUM (PORCINE) 5000 UNIT/ML IJ SOLN
5000.0000 [IU] | Freq: Three times a day (TID) | INTRAMUSCULAR | Status: DC
  Administered 2013-09-16 – 2013-09-18 (×5): 5000 [IU] via SUBCUTANEOUS
  Filled 2013-09-16 (×8): qty 1

## 2013-09-16 MED ORDER — BIOTENE DRY MOUTH MT LIQD
15.0000 mL | Freq: Two times a day (BID) | OROMUCOSAL | Status: DC
  Administered 2013-09-16 – 2013-09-18 (×4): 15 mL via OROMUCOSAL

## 2013-09-16 MED ORDER — DEXTROSE 5 % IV SOLN
1.0000 g | INTRAVENOUS | Status: DC
  Administered 2013-09-17: 1 g via INTRAVENOUS
  Filled 2013-09-16 (×2): qty 1

## 2013-09-16 MED ORDER — SODIUM CHLORIDE 0.9 % IV SOLN: INTRAVENOUS | Status: DC

## 2013-09-16 MED ORDER — IPRATROPIUM-ALBUTEROL 0.5-2.5 (3) MG/3ML IN SOLN
3.0000 mL | RESPIRATORY_TRACT | Status: DC
  Filled 2013-09-16: qty 3

## 2013-09-16 MED ORDER — ALBUTEROL SULFATE (2.5 MG/3ML) 0.083% IN NEBU
2.5000 mg | INHALATION_SOLUTION | Freq: Once | RESPIRATORY_TRACT | Status: AC
  Administered 2013-09-16: 2.5 mg via RESPIRATORY_TRACT
  Filled 2013-09-16: qty 3

## 2013-09-16 MED ORDER — VANCOMYCIN HCL IN DEXTROSE 750-5 MG/150ML-% IV SOLN
750.0000 mg | INTRAVENOUS | Status: DC
  Filled 2013-09-16: qty 150

## 2013-09-16 MED ORDER — ACETAMINOPHEN 325 MG PO TABS
650.0000 mg | ORAL_TABLET | Freq: Once | ORAL | Status: AC
  Administered 2013-09-16: 650 mg via ORAL
  Filled 2013-09-16: qty 2

## 2013-09-16 MED ORDER — DEXTROSE 5 % IV SOLN
1.0000 g | Freq: Once | INTRAVENOUS | Status: AC
  Administered 2013-09-16: 1 g via INTRAVENOUS
  Filled 2013-09-16: qty 1

## 2013-09-16 NOTE — Progress Notes (Signed)
NURSING PROGRESS NOTE  Barbara Garner [id number] Admission Data: 09/16/2013 7:12 PM Attending Provider: Verlee Monte, MD [id number] [place], MD Code Status: FULL  Barbara Garner is a 79 y.o. female patient admitted from ED:  -No acute distress noted.  -No complaints of shortness of breath.  -No complaints of chest pain.   Cardiac Monitoring: Box # 11 in place.  Blood pressure 143/65, pulse 64, temperature 97.9 F (36.6 C), temperature source Oral, resp. rate 24, height 5\' 3"  (1.6 m), weight 51.256 kg (113 lb), SpO2 97.00%.   IV Fluids:  IV in place, occlusive dsg intact without redness, IV cath hand left, condition patent and no redness normal saline.   Allergies:  Metformin and Promethazine hcl  Past Medical History:   has a past medical history of Iron deficiency anemia, unspecified; Chronic diastolic CHF (congestive heart failure); CAD  S/P CABG x 3 (08/2011); Type II or unspecified type diabetes mellitus without mention of complication, not stated as uncontrolled; Contact dermatitis and other eczema, due to unspecified cause; Esophageal reflux; Headache(784.0); Pacemaker MDT dual chamber; Pure hypercholesterolemia; hypertension; Hypothyroidism; Atrioventricular block, complete -intermittent; Memory loss; Idiopathic peripheral neuropathy; LBBB (left bundle branch block); Carotid stenosis; Severe aortic stenosis -S/P AVR; Extrinsic asthma, unspecified; CKD (chronic kidney disease) stage 4, GFR 15-29 ml/min; and Impaired vision.  Past Surgical History:   has past surgical history that includes Mitral valve replacement [id number]); Cardiac catheterization; Esophageal dilation (1997); Colonoscopy (04/1999); Esophagogastroduodenoscopy (04/1999); DEXA (10/2003 ,09/2005); Cataract extraction, bilateral (2003); Esophagogastroduodenoscopy (04/2002); Cardiovascular stress test (10/2002); Abdominal hysterectomy (1977); bladder tack (1977); Colonoscopy (06/2004); carotid dopplers (2006); Refractive surgery  (2006); Opthy (11/1998;12/01;11/02); Aortic valve replacement (09/06/2011); and Coronary artery bypass graft (09/06/2011).  Social History:   reports that she has never smoked. She has never used smokeless tobacco. She reports that she does not drink alcohol or use illicit drugs.  Skin: Stage 1 noted to buttocks. Bruise noted to R side of abdomen   Patient/Family orientated to room. Information packet given to patient/family. Admission inpatient armband information verified with patient/family to include name and date of birth and placed on patient arm. Side rails up x 2, fall assessment and education completed with patient/family. Patient/family able to verbalize understanding of risk associated with falls and verbalized understanding to call for assistance before getting out of bed. Call light within reach. Patient/family able to voice and demonstrate understanding of unit orientation instructions.    Will continue to evaluate and treat per MD orders.  Wallie Renshaw, RN

## 2013-09-16 NOTE — Progress Notes (Addendum)
ANTIBIOTIC CONSULT NOTE - INITIAL  Pharmacy Consult for vancomycin Indication: rule out pneumonia  Allergies  Allergen Reactions  . Metformin     REACTION: intolerant  . Promethazine Hcl     REACTION: u/k    Patient Measurements: Height: 5\' 3"  (160 cm) Weight: 113 lb (51.256 kg) IBW/kg (Calculated) : 52.4   Vital Signs: Temp: 97.9 F (36.6 C) (06/30 1059) Temp src: Oral (06/30 1059) BP: 157/75 mmHg (06/30 1203) Pulse Rate: 65 (06/30 1203) Intake/Output from previous day:   Intake/Output from this shift: Total I/O In: -  Out: 450 [Urine:450]  Labs:  Recent Labs  09/16/13 1120  WBC 15.4*  HGB 11.3*  PLT 224  CREATININE 1.10   Estimated Creatinine Clearance: 34.7 ml/min (by C-G formula based on Cr of 1.1). No results found for this basename: VANCOTROUGH, VANCOPEAK, VANCORANDOM, GENTTROUGH, GENTPEAK, GENTRANDOM, TOBRATROUGH, TOBRAPEAK, TOBRARND, AMIKACINPEAK, AMIKACINTROU, AMIKACIN,  in the last 72 hours   Microbiology: No results found for this or any previous visit (from the past 720 hour(s)).  Medical History: Past Medical History  Diagnosis Date  . Iron deficiency anemia, unspecified   . Chronic diastolic CHF (congestive heart failure)   . CAD  S/P CABG x 3 08/2011     LIMA to diagonal branch, SVG to OM1, SVG to PDA, EVH via bilateral thighs  . Type II or unspecified type diabetes mellitus without mention of complication, not stated as uncontrolled   . Contact dermatitis and other eczema, due to unspecified cause   . Esophageal reflux   . Headache(784.0)   . Pacemaker MDT dual chamber     DOI 08/2011  . Pure hypercholesterolemia   . hypertension   . Hypothyroidism   . Atrioventricular block, complete -intermittent   . Memory loss   . Idiopathic peripheral neuropathy   . LBBB (left bundle branch block)   . Carotid stenosis     Carotid dopplers 0000000 with [id number] LICA stenosis  . Severe aortic stenosis -S/P AVR      19 mm [facility] Ease pericardial  tissue valve  . Extrinsic asthma, unspecified     hasn't used inhaler in past year  . CKD (chronic kidney disease) stage 4, GFR 15-29 ml/min     Cr 1.6 in 6/11, sees Dr. Arty Baumgartner  . Impaired vision     pt. reports that she identifies her meds by looking at the pillls, she is not able to read labels on bottles    Assessment: 79 YOF seen at [facility] with respiratory distress- SOB worsening over the past 3 days.  WBC elevated at 15.4, currently afebrile. SCr 1.1 with est CrCL ~51mL/min. Cefepime 1g x1 has been ordered in addition to vancomycin per pharmacy.  Goal of Therapy:  Vancomycin trough level 15-20 mcg/ml  Plan:  1. Vancomycin 1000mg  IV x1 as loading dose 2. Will follow up plan of care for patient transfer and will enter maintenance doses when hospital established  Lauren D. Bajbus, PharmD, BCPS Clinical Pharmacist Pager: [phone] 09/16/2013 2:18 PM  Addendum:  Patient arrived to [facility] campus. Received vancomycin 1000 mg IV and cefepime 1 g already today.  Vancomycin 750 mg IV q24h - start tomorrow Cefepime 1 g IV q24h - start tomorrow  [facility], Pharm.D., BCPS Clinical Pharmacist Pager: [phone] 09/16/2013 5:26 PM

## 2013-09-16 NOTE — H&P (Signed)
[facility] History and Physical  Barbara Garner [id number] DOB: Aug 11, 1934 DOA: 09/16/2013  Referring physician: EDP PCP: Loura Pardon, MD   Chief Complaint: Shortness of breath  HPI: Barbara Garner is a 79 y.o. female with past medical history of chronic CHF and hypertension came to the hospital because of shortness of breath. Patient has problem remembering what happened exactly but what I gather from her and from the EDP notes that she had shortness of breath for the past 2-3 days, it got worse to the point she sought medical help today. Denies sweats, not sure about fever, and denies chest pain. In the ED patient was hypoxic and has to be on 2 L of oxygen to keep oxygen in the mid 90s, his chest x-ray showed no abnormalities so CT scan was done and showed right lower lobe pneumonia, patient also has elevated BNP without clear pulmonary edema on the CAT scan.   Review of Systems:  Constitutional: negative for anorexia, fevers and sweats Eyes: negative for irritation, redness and visual disturbance Ears, nose, mouth, throat, and face: negative for earaches, epistaxis, nasal congestion and sore throat Respiratory: negative for cough, dyspnea on exertion, sputum and wheezing Cardiovascular: negative for chest pain, dyspnea, lower extremity edema, orthopnea, palpitations and syncope Gastrointestinal: negative for abdominal pain, constipation, diarrhea, melena, nausea and vomiting Genitourinary:negative for dysuria, frequency and hematuria Hematologic/lymphatic: negative for bleeding, easy bruising and lymphadenopathy Musculoskeletal:negative for arthralgias, muscle weakness and stiff joints Neurological: negative for coordination problems, gait problems, headaches and weakness Endocrine: negative for diabetic symptoms including polydipsia, polyuria and weight loss Allergic/Immunologic: negative for anaphylaxis, hay fever and urticaria  Past Medical History  Diagnosis Date  . Iron  deficiency anemia, unspecified   . Chronic diastolic CHF (congestive heart failure)   . CAD  S/P CABG x 3 08/2011     LIMA to diagonal branch, SVG to OM1, SVG to PDA, EVH via bilateral thighs  . Type II or unspecified type diabetes mellitus without mention of complication, not stated as uncontrolled   . Contact dermatitis and other eczema, due to unspecified cause   . Esophageal reflux   . Headache(784.0)   . Pacemaker MDT dual chamber     DOI 08/2011  . Pure hypercholesterolemia   . hypertension   . Hypothyroidism   . Atrioventricular block, complete -intermittent   . Memory loss   . Idiopathic peripheral neuropathy   . LBBB (left bundle branch block)   . Carotid stenosis     Carotid dopplers 0000000 with [id number] LICA stenosis  . Severe aortic stenosis -S/P AVR      19 mm [facility] Ease pericardial tissue valve  . Extrinsic asthma, unspecified     hasn't used inhaler in past year  . CKD (chronic kidney disease) stage 4, GFR 15-29 ml/min     Cr 1.6 in 6/11, sees Dr. Arty Baumgartner  . Impaired vision     pt. reports that she identifies her meds by looking at the pillls, she is not able to read labels on bottles   Past Surgical History  Procedure Laterality Date  . Ptca  [phone]    5 blockages  . Cardiac catheterization    . Esophageal dilation  1997  . Colonoscopy  04/1999    1 polyp  . Esophagogastroduodenoscopy  04/1999    HH; "watermelon stomach" (severe gastritis)  . Dexa  10/2003 ,09/2005    osteoporosis,  Osteopenia  . Cataract extraction, bilateral  2003  . Esophagogastroduodenoscopy  04/2002  Gastritis  . Cardiovascular stress test  10/2002    normal (per patient)  . Abdominal hysterectomy  1977    partial; fibroids  . Bladder tack  1977  . Colonoscopy  06/2004    Neg. Int hem  . Carotid dopplers  2006  . Refractive surgery  2006  . Opthy  11/1998;12/01;11/02  . Aortic valve replacement  09/06/2011    Procedure: AORTIC VALVE REPLACEMENT (AVR);  Surgeon: Rexene Alberts,  MD;  Location: [facility];  Service: Open Heart Surgery;  Laterality: N/A;  . Coronary artery bypass graft  09/06/2011    Procedure: CORONARY ARTERY BYPASS GRAFTING (CABG);  Surgeon: Rexene Alberts, MD;  Location: [facility];  Service: Open Heart Surgery;  Laterality: N/A;   Social History:   reports that she has never smoked. She has never used smokeless tobacco. She reports that she does not drink alcohol or use illicit drugs.  Allergies  Allergen Reactions  . Metformin     REACTION: intolerant  . Promethazine Hcl     REACTION: u/k    Family History  Problem Relation Age of Onset  . Stroke Father     died in his 73's  . Stroke Mother     died in her 79's     Prior to Admission medications   Medication Sig Start Date End Date Taking? Authorizing Provider  aspirin EC 81 MG EC tablet Take 1 tablet (81 mg total) by mouth daily. 08/12/13   Ripudeep Krystal Eaton, MD  atorvastatin (LIPITOR) 40 MG tablet Take 40 mg by mouth daily.    Historical Provider, MD  carvedilol (COREG) 12.5 MG tablet Take 1 tablet (12.5 mg total) by mouth 2 (two) times daily. 09/15/13   Larey Dresser, MD  cephALEXin (KEFLEX) 500 MG capsule Take 1 capsule (500 mg total) by mouth 4 (four) times daily. 08/22/13   Tanna Furry, MD  Cholecalciferol (VITAMIN D-3) 1000 UNITS CAPS Take 1 capsule by mouth daily.    Historical Provider, MD  ferrous gluconate (FERGON) 324 MG tablet Take 1 tablet (324 mg total) by mouth daily with breakfast. 08/12/13   Ripudeep Krystal Eaton, MD  fluticasone (FLONASE) 50 MCG/ACT nasal spray Place 1 spray into both nostrils daily.    Historical Provider, MD  furosemide (LASIX) 20 MG tablet Take 1 tablet (20 mg total) by mouth daily. 08/13/13   Ripudeep Krystal Eaton, MD  glipiZIDE (GLUCOTROL XL) 10 MG 24 hr tablet Take 10 mg by mouth 2 (two) times daily.    Historical Provider, MD  levothyroxine (SYNTHROID, LEVOTHROID) 50 MCG tablet Take 50 mcg by mouth daily before breakfast.    Historical Provider, MD  omeprazole (PRILOSEC) 20  MG capsule Take 20 mg by mouth daily.    Historical Provider, MD  tolterodine (DETROL LA) 2 MG 24 hr capsule Take 1 capsule (2 mg total) by mouth daily. 07/16/13   Abner Greenspan, MD  traMADol (ULTRAM) 50 MG tablet Take 1 tablet (50 mg total) by mouth every 6 (six) hours as needed. 08/22/13   Tanna Furry, MD   Physical Exam: Filed Vitals:   09/16/13 1714  BP: 154/73  Pulse: 64  Temp: 97.9 F (36.6 C)  Resp:    Constitutional: Oriented to person, place, and time. Well-developed and well-nourished. Cooperative.  Head: Normocephalic and atraumatic.  Nose: Nose normal.  Mouth/Throat: Uvula is midline, oropharynx is clear and moist and mucous membranes are normal.  Eyes: Conjunctivae and EOM are normal. Pupils are equal, round,  and reactive to light.  Neck: Trachea normal and normal range of motion. Neck supple.  Cardiovascular: Normal rate, regular rhythm, S1 normal, S2 normal, normal heart sounds and intact distal pulses.   Pulmonary/Chest: Effort normal and breath sounds normal.  Abdominal: Soft. Bowel sounds are normal. There is no hepatosplenomegaly. There is no tenderness.  Musculoskeletal: +1 pedal edema  Neurological: Alert and oriented to person, place, and time. He has normal strength. No cranial nerve deficit or sensory deficit.  Skin: Skin is warm, dry and intact.  Psychiatric: Has a normal mood and affect. Speech is normal and behavior is normal.   Labs on Admission:  Basic Metabolic Panel:  Recent Labs Lab 09/16/13 1120  NA 140  K 4.2  CL 102  CO2 24  GLUCOSE 196*  BUN 32*  CREATININE 1.10  CALCIUM 9.1   Liver Function Tests:  Recent Labs Lab 09/16/13 1120  AST 16  ALT 15  ALKPHOS 103  BILITOT 0.6  PROT 7.3  ALBUMIN 3.6   No results found for this basename: LIPASE, AMYLASE,  in the last 168 hours No results found for this basename: AMMONIA,  in the last 168 hours CBC:  Recent Labs Lab 09/16/13 1120  WBC 15.4*  NEUTROABS 9.7*  HGB 11.3*  HCT 34.2*    MCV 90.2  PLT 224   Cardiac Enzymes:  Recent Labs Lab 09/16/13 1120  TROPONINI <0.30    BNP (last 3 results)  Recent Labs  08/09/13 0850 09/16/13 1120  PROBNP 7547.0* 11854.0*   CBG:  Recent Labs Lab 09/16/13 1717  GLUCAP 271*    Radiological Exams on Admission: Ct Chest Wo Contrast  09/16/2013   CLINICAL DATA:  Three-day history of shortness of breath  EXAM: CT CHEST WITHOUT CONTRAST  TECHNIQUE: Multidetector CT imaging of the chest was performed following the standard protocol without IV contrast.  COMPARISON:  Portable chest x-ray of September 16, 2013  FINDINGS: There are small bilateral pleural effusions layering posteriorly. There is basilar atelectasis versus pneumonia on the right with minimal atelectasis at the left lung base. There is a large hiatal hernia -partially intra thoracic stomach. The cardiac chambers are normal in size. The patient has undergone previous CABG and permanent pacemaker placement. There is no pericardial effusion. The caliber of the thoracic aorta is normal.  The thoracic vertebral bodies are preserved in height. There is diffuse narrowing of the disc spaces. The observed portions of the rib cage exhibit no acute abnormalities.  Within the upper abdomen the observed portions of the liver and spleen are unremarkable.  IMPRESSION: 1. There are small bilateral pleural effusions with bibasilar atelectasis and right basilar pneumonia. There is no evidence of pulmonary edema. 2. There is a large hiatal hernia-partially intra thoracic stomach.   Electronically Signed   By: David  Martinique   On: 09/16/2013 13:19   Dg Chest Port 1 View  09/16/2013   CLINICAL DATA:  Shortness of Breath  EXAM: PORTABLE CHEST - 1 VIEW  COMPARISON:  08/09/2013  FINDINGS: Central mild vascular congestion. Cardiomegaly again noted. Dual lead cardiac pacemaker is unchanged in position. Mild perihilar interstitial prominence without convincing pulmonary edema. Bilateral small pleural  effusion with bilateral basilar atelectasis or infiltrate.  IMPRESSION: Cardiomegaly. Central vascular congestion without convincing pulmonary edema. Bilateral small pleural effusion with bilateral basilar atelectasis or infiltrate.   Electronically Signed   By: Lahoma Crocker M.D.   On: 09/16/2013 12:07    EKG: Independently reviewed.   Assessment/Plan Active Problems:  HYPOTHYROIDISM   DIABETES MELLITUS, TYPE II   GERD   Chronic diastolic CHF (congestive heart failure)   HCAP (healthcare-associated pneumonia)    HCAP -Right lower lobe pneumonia, patient been recently in the hospital show this is treated as HCAP. -Started on cefepime and vancomycin in the emergency department with continued. -Supportive management with mucolytics and antitussives, bronchodilators and oxygen as needed. -Blood cultures obtained.  Chronic CHF -Has +1 pedal edema, elevated BNP. Received 80 mg of Lasix in the emergency department -Start patient on Lasix 40 mg daily. -Follow clinically by daily weights.  Diabetes mellitus type 2 -Seems to be controlled, hemoglobin A1c was 6.3 on 08/09/2013. -Carb modified diet, hold glipizide and start insulin sliding scale.  Hypothyroidism -Check TSH.   Code Status: Full code Family Communication: Plan d/w patient  Disposition Plan: Inpatient  Time spent: 46 minutes  [facility] Pager [phone]

## 2013-09-16 NOTE — Progress Notes (Signed)
Report received from Larry Sierras at [facility] ED for patient to be admitted into [phone]

## 2013-09-16 NOTE — Progress Notes (Signed)
Transfer from [facility] with HCAP and possibly CHF exacerbation. Unclear whether she is compliant with lasix.  Recent admission for acute on chronic diastolic HF, ARI, UTI.  proBNP higher than previous. WBC 15K.  CT chest shows infiltrate, pleural effusions, no frank pulmonary edema. Has received lasix and abx. Accepted to [facility] tele.  CALL FLOW MANAGER ON ARRIVAL TO FLOOR [phone].  Doree Barthel, MD [facility]

## 2013-09-16 NOTE — ED Provider Notes (Signed)
CSN: [id number]     Arrival date & time 09/16/13  1055 History   First MD Initiated Contact with Patient 09/16/13 1107     Chief Complaint  Patient presents with  . Respiratory Distress     (Consider location/radiation/quality/duration/timing/severity/associated sxs/prior Treatment) The history is provided by the patient and the spouse.   79 year old female mid to [facility] the end of May for shortness of breath admitting diagnosis was non-STEMI discharge diagnosis exacerbation of CHF. Patient is followed by [facility]. Also followed by cardiology at [facility]. Patient denies any chest pain. Patient having 3 day history of difficulty breathing has gotten progressively worse. Patient is writhing in some respiratory distress. Not able to talk in complete sentences. Patient somewhat agitated. Patient is alert. Patient seems to have some memory deficit problems. No formal diagnosis of dementia listed.  Past Medical History  Diagnosis Date  . Iron deficiency anemia, unspecified   . Chronic diastolic CHF (congestive heart failure)   . CAD  S/P CABG x 3 08/2011     LIMA to diagonal branch, SVG to OM1, SVG to PDA, EVH via bilateral thighs  . Type II or unspecified type diabetes mellitus without mention of complication, not stated as uncontrolled   . Contact dermatitis and other eczema, due to unspecified cause   . Esophageal reflux   . Headache(784.0)   . Pacemaker MDT dual chamber     DOI 08/2011  . Pure hypercholesterolemia   . hypertension   . Hypothyroidism   . Atrioventricular block, complete -intermittent   . Memory loss   . Idiopathic peripheral neuropathy   . LBBB (left bundle branch block)   . Carotid stenosis     Carotid dopplers 0000000 with [id number] LICA stenosis  . Severe aortic stenosis -S/P AVR      19 mm [employer] Ease pericardial tissue valve  . Extrinsic asthma, unspecified     hasn't used inhaler in past year  . CKD (chronic kidney disease) stage 4, GFR 15-29 ml/min     Cr 1.6 in  6/11, sees Dr. Arty Baumgartner  . Impaired vision     pt. reports that she identifies her meds by looking at the pillls, she is not able to read labels on bottles   Past Surgical History  Procedure Laterality Date  . Ptca  [phone]    5 blockages  . Cardiac catheterization    . Esophageal dilation  1997  . Colonoscopy  04/1999    1 polyp  . Esophagogastroduodenoscopy  04/1999    HH; "watermelon stomach" (severe gastritis)  . Dexa  10/2003 ,09/2005    osteoporosis,  Osteopenia  . Cataract extraction, bilateral  2003  . Esophagogastroduodenoscopy  04/2002    Gastritis  . Cardiovascular stress test  10/2002    normal (per patient)  . Abdominal hysterectomy  1977    partial; fibroids  . Bladder tack  1977  . Colonoscopy  06/2004    Neg. Int hem  . Carotid dopplers  2006  . Refractive surgery  2006  . Opthy  11/1998;12/01;11/02  . Aortic valve replacement  09/06/2011    Procedure: AORTIC VALVE REPLACEMENT (AVR);  Surgeon: Rexene Alberts, MD;  Location: [facility];  Service: Open Heart Surgery;  Laterality: N/A;  . Coronary artery bypass graft  09/06/2011    Procedure: CORONARY ARTERY BYPASS GRAFTING (CABG);  Surgeon: Rexene Alberts, MD;  Location: [facility];  Service: Open Heart Surgery;  Laterality: N/A;   Family History  Problem Relation  Age of Onset  . Stroke Father     died in his 26's  . Stroke Mother     died in her 73's   History  Substance Use Topics  . Smoking status: Never Smoker   . Smokeless tobacco: Never Used  . Alcohol Use: No   OB History   Grav Para Term Preterm Abortions TAB SAB Ect Mult Living                 Review of Systems  Unable to perform ROS  05 caveat applies patient seems to have some dementia.    Allergies  Metformin and Promethazine hcl  Home Medications   Prior to Admission medications   Medication Sig Start Date End Date Taking? Authorizing Provider  aspirin EC 81 MG EC tablet Take 1 tablet (81 mg total) by mouth daily. 08/12/13   Ripudeep Krystal Eaton,  MD  atorvastatin (LIPITOR) 40 MG tablet Take 40 mg by mouth daily.    Historical Provider, MD  carvedilol (COREG) 12.5 MG tablet Take 1 tablet (12.5 mg total) by mouth 2 (two) times daily. 09/15/13   Larey Dresser, MD  cephALEXin (KEFLEX) 500 MG capsule Take 1 capsule (500 mg total) by mouth 4 (four) times daily. 08/22/13   Tanna Furry, MD  Cholecalciferol (VITAMIN D-3) 1000 UNITS CAPS Take 1 capsule by mouth daily.    Historical Provider, MD  ferrous gluconate (FERGON) 324 MG tablet Take 1 tablet (324 mg total) by mouth daily with breakfast. 08/12/13   Ripudeep Krystal Eaton, MD  fluticasone (FLONASE) 50 MCG/ACT nasal spray Place 1 spray into both nostrils daily.    Historical Provider, MD  furosemide (LASIX) 20 MG tablet Take 1 tablet (20 mg total) by mouth daily. 08/13/13   Ripudeep Krystal Eaton, MD  glipiZIDE (GLUCOTROL XL) 10 MG 24 hr tablet Take 10 mg by mouth 2 (two) times daily.    Historical Provider, MD  levothyroxine (SYNTHROID, LEVOTHROID) 50 MCG tablet Take 50 mcg by mouth daily before breakfast.    Historical Provider, MD  omeprazole (PRILOSEC) 20 MG capsule Take 20 mg by mouth daily.    Historical Provider, MD  tolterodine (DETROL LA) 2 MG 24 hr capsule Take 1 capsule (2 mg total) by mouth daily. 07/16/13   Abner Greenspan, MD  traMADol (ULTRAM) 50 MG tablet Take 1 tablet (50 mg total) by mouth every 6 (six) hours as needed. 08/22/13   Tanna Furry, MD   BP 157/75  Pulse 65  Temp(Src) 97.9 F (36.6 C) (Oral)  Resp 24  Ht 5\' 3"  (1.6 m)  Wt 113 lb (51.256 kg)  BMI 20.02 kg/m2  SpO2 100% Physical Exam  Nursing note and vitals reviewed. Constitutional: She appears well-developed and well-nourished. She appears distressed.  HENT:  Head: Normocephalic.  Mouth/Throat: Oropharynx is clear and moist.  Eyes: Conjunctivae and EOM are normal. Pupils are equal, round, and reactive to light.  Neck: Normal range of motion.  Cardiovascular: Normal rate, regular rhythm and normal heart sounds.   Pulmonary/Chest:  She is in respiratory distress. She has no wheezes. She has rales.  Abdominal: Soft. There is no tenderness.  Musculoskeletal: Normal range of motion. She exhibits no tenderness.  Neurological: No cranial nerve deficit. She exhibits normal muscle tone. Coordination normal.  Skin: Skin is warm.    ED Course  Procedures (including critical care time) Labs Review Labs Reviewed  PRO B NATRIURETIC PEPTIDE - Abnormal; Notable for the following:    Pro B  Natriuretic peptide (BNP) 11854.0 (*)    All other components within normal limits  COMPREHENSIVE METABOLIC PANEL - Abnormal; Notable for the following:    Glucose, Bld 196 (*)    BUN 32 (*)    GFR calc non Af Amer 47 (*)    GFR calc Af Amer 55 (*)    All other components within normal limits  CBC WITH DIFFERENTIAL - Abnormal; Notable for the following:    WBC 15.4 (*)    RBC 3.79 (*)    Hemoglobin 11.3 (*)    HCT 34.2 (*)    RDW 16.1 (*)    Neutro Abs 9.7 (*)    Monocytes Absolute 1.7 (*)    All other components within normal limits  URINALYSIS, ROUTINE W REFLEX MICROSCOPIC - Abnormal; Notable for the following:    Leukocytes, UA MODERATE (*)    All other components within normal limits  URINE MICROSCOPIC-ADD ON - Abnormal; Notable for the following:    Bacteria, UA FEW (*)    All other components within normal limits  URINE CULTURE  CULTURE, BLOOD (ROUTINE X 2)  CULTURE, BLOOD (ROUTINE X 2)  PROTIME-INR  TROPONIN I   Results for orders placed during the hospital encounter of 09/16/13  PRO B NATRIURETIC PEPTIDE      Result Value Ref Range   Pro B Natriuretic peptide (BNP) 11854.0 (*) 0 - 450 pg/mL  PROTIME-INR      Result Value Ref Range   Prothrombin Time 13.2  11.6 - 15.2 seconds   INR 1.00  0.00 - 1.49  COMPREHENSIVE METABOLIC PANEL      Result Value Ref Range   Sodium 140  137 - 147 mEq/L   Potassium 4.2  3.7 - 5.3 mEq/L   Chloride 102  96 - 112 mEq/L   CO2 24  19 - 32 mEq/L   Glucose, Bld 196 (*) 70 - 99 mg/dL    BUN 32 (*) 6 - 23 mg/dL   Creatinine, Ser 1.10  0.50 - 1.10 mg/dL   Calcium 9.1  8.4 - 10.5 mg/dL   Total Protein 7.3  6.0 - 8.3 g/dL   Albumin 3.6  3.5 - 5.2 g/dL   AST 16  0 - 37 U/L   ALT 15  0 - 35 U/L   Alkaline Phosphatase 103  39 - 117 U/L   Total Bilirubin 0.6  0.3 - 1.2 mg/dL   GFR calc non Af Amer 47 (*) >90 mL/min   GFR calc Af Amer 55 (*) >90 mL/min  CBC WITH DIFFERENTIAL      Result Value Ref Range   WBC 15.4 (*) 4.0 - 10.5 K/uL   RBC 3.79 (*) 3.87 - 5.11 MIL/uL   Hemoglobin 11.3 (*) 12.0 - 15.0 g/dL   HCT 34.2 (*) 36.0 - 46.0 %   MCV 90.2  78.0 - 100.0 fL   MCH 29.8  26.0 - 34.0 pg   MCHC 33.0  30.0 - 36.0 g/dL   RDW 16.1 (*) 11.5 - 15.5 %   Platelets 224  150 - 400 K/uL   Neutrophils Relative % 60  43 - 77 %   Lymphocytes Relative 26  12 - 46 %   Monocytes Relative 11  3 - 12 %   Eosinophils Relative 0  0 - 5 %   Basophils Relative 0  0 - 1 %   Myelocytes 3     Neutro Abs 9.7 (*) 1.7 - 7.7 K/uL   Lymphs  Abs 4.0  0.7 - 4.0 K/uL   Monocytes Absolute 1.7 (*) 0.1 - 1.0 K/uL   Eosinophils Absolute 0.0  0.0 - 0.7 K/uL   Basophils Absolute 0.0  0.0 - 0.1 K/uL   Smear Review LARGE PLATELETS PRESENT    URINALYSIS, ROUTINE W REFLEX MICROSCOPIC      Result Value Ref Range   Color, Urine YELLOW  YELLOW   APPearance CLEAR  CLEAR   Specific Gravity, Urine 1.009  1.005 - 1.030   pH 5.0  5.0 - 8.0   Glucose, UA NEGATIVE  NEGATIVE mg/dL   Hgb urine dipstick NEGATIVE  NEGATIVE   Bilirubin Urine NEGATIVE  NEGATIVE   Ketones, ur NEGATIVE  NEGATIVE mg/dL   Protein, ur NEGATIVE  NEGATIVE mg/dL   Urobilinogen, UA 0.2  0.0 - 1.0 mg/dL   Nitrite NEGATIVE  NEGATIVE   Leukocytes, UA MODERATE (*) NEGATIVE  TROPONIN I      Result Value Ref Range   Troponin I <0.30  <0.30 ng/mL  URINE MICROSCOPIC-ADD ON      Result Value Ref Range   Squamous Epithelial / LPF RARE  RARE   WBC, UA 7-10  <3 WBC/hpf   RBC / HPF 0-2  <3 RBC/hpf   Bacteria, UA FEW (*) RARE   Urine-Other MUCOUS  PRESENT       Imaging Review Ct Chest Wo Contrast  09/16/2013   CLINICAL DATA:  Three-day history of shortness of breath  EXAM: CT CHEST WITHOUT CONTRAST  TECHNIQUE: Multidetector CT imaging of the chest was performed following the standard protocol without IV contrast.  COMPARISON:  Portable chest x-ray of September 16, 2013  FINDINGS: There are small bilateral pleural effusions layering posteriorly. There is basilar atelectasis versus pneumonia on the right with minimal atelectasis at the left lung base. There is a large hiatal hernia -partially intra thoracic stomach. The cardiac chambers are normal in size. The patient has undergone previous CABG and permanent pacemaker placement. There is no pericardial effusion. The caliber of the thoracic aorta is normal.  The thoracic vertebral bodies are preserved in height. There is diffuse narrowing of the disc spaces. The observed portions of the rib cage exhibit no acute abnormalities.  Within the upper abdomen the observed portions of the liver and spleen are unremarkable.  IMPRESSION: 1. There are small bilateral pleural effusions with bibasilar atelectasis and right basilar pneumonia. There is no evidence of pulmonary edema. 2. There is a large hiatal hernia-partially intra thoracic stomach.   Electronically Signed   By: Lanasia Porras  [place]   On: 09/16/2013 13:19   Dg Chest Port 1 View  09/16/2013   CLINICAL DATA:  Shortness of Breath  EXAM: PORTABLE CHEST - 1 VIEW  COMPARISON:  08/09/2013  FINDINGS: Central mild vascular congestion. Cardiomegaly again noted. Dual lead cardiac pacemaker is unchanged in position. Mild perihilar interstitial prominence without convincing pulmonary edema. Bilateral small pleural effusion with bilateral basilar atelectasis or infiltrate.  IMPRESSION: Cardiomegaly. Central vascular congestion without convincing pulmonary edema. Bilateral small pleural effusion with bilateral basilar atelectasis or infiltrate.   Electronically Signed   By:  Lahoma Crocker M.D.   On: 09/16/2013 12:07     EKG Interpretation   Date/Time:  Tuesday September 16 2013 11:06:21 EDT Ventricular Rate:  66 PR Interval:    QRS Duration: 170 QT Interval:  482 QTC Calculation: 505 R Axis:   -80 Text Interpretation:  Ventricular-paced rhythm Abnormal ECG Confirmed by  ZACKOWSKI  MD, SCOTT [phone]) on 09/16/2013 11:12:39  AM     CRITICAL CARE Performed by: Fredia Sorrow Total critical care time: 30 Critical care time was exclusive of separately billable procedures and treating other patients. Critical care was necessary to treat or prevent imminent or life-threatening deterioration. Critical care was time spent personally by me on the following activities: development of treatment plan with patient and/or surrogate as well as nursing, discussions with consultants, evaluation of patient's response to treatment, examination of patient, obtaining history from patient or surrogate, ordering and performing treatments and interventions, ordering and review of laboratory studies, ordering and review of radiographic studies, pulse oximetry and re-evaluation of patient's condition.     MDM   Final diagnoses:  HCAP (healthcare-associated pneumonia)  UTI (lower urinary tract infection)    The patient arrived in some acute respiratory distress. However on room air pulse ox is running a low 90%. Patient was recently admitted [facility] for her congestive heart exacerbation at the end of may. He thought maybe she had a non-STEMI but that did completely rule out. Patient arrived here having trouble breathing with rapid respiratory rate. Based on 2 L nasal cannula oxygen   sats up to the mid 90s. The patient did not have any wheezing seemed to have some bilateral rales. Patient was given a duo neb with some improvement. Chest x-ray showed no evidence of frank pulmonary edema. However patient was supposed to be on Lasix at home and was not taking it. Patient given 80 mg of  Lasix. CT chest ordered to further evaluate the chest which showed a right lower lobe pneumonia. No pulmonary edema and small pleural effusions. This probably explains the leukocytosis and explains her respiratory problems. Patient is much more comfortable now and on the 2 L nasal cannula oxygen. Sats are above 95%.  Discussed with hospitalist will be admitted to [facility] transferred via [place]. Temporary admit orders completed. Patient will go to telemetry.    Fredia Sorrow, MD 09/16/13 [phone]

## 2013-09-16 NOTE — ED Notes (Signed)
Attempted IV access x 2 in left AC unsuccessful.

## 2013-09-16 NOTE — ED Notes (Signed)
3 day history of difficulty breathing progressively worsening.

## 2013-09-17 DIAGNOSIS — I5031 Acute diastolic (congestive) heart failure: Secondary | ICD-10-CM

## 2013-09-17 LAB — CBC
HCT: 34.9 % — ABNORMAL LOW (ref 36.0–46.0)
Hemoglobin: 11.4 g/dL — ABNORMAL LOW (ref 12.0–15.0)
MCH: 29.2 pg (ref 26.0–34.0)
MCHC: 32.7 g/dL (ref 30.0–36.0)
MCV: 89.3 fL (ref 78.0–100.0)
Platelets: 229 10*3/uL (ref 150–400)
RBC: 3.91 MIL/uL (ref 3.87–5.11)
RDW: 16.5 % — AB (ref 11.5–15.5)
WBC: 14.1 10*3/uL — AB (ref 4.0–10.5)

## 2013-09-17 LAB — BASIC METABOLIC PANEL
BUN: 40 mg/dL — ABNORMAL HIGH (ref 6–23)
CALCIUM: 8.9 mg/dL (ref 8.4–10.5)
CO2: 26 meq/L (ref 19–32)
CREATININE: 1.32 mg/dL — AB (ref 0.50–1.10)
Chloride: 97 mEq/L (ref 96–112)
GFR calc Af Amer: 44 mL/min — ABNORMAL LOW (ref >90)
GFR, EST NON AFRICAN AMERICAN: 38 mL/min — AB (ref >90)
Glucose, Bld: 343 mg/dL — ABNORMAL HIGH (ref 70–99)
Potassium: 4.2 mEq/L (ref 3.7–5.3)
SODIUM: 140 meq/L (ref 137–147)

## 2013-09-17 LAB — GLUCOSE, CAPILLARY
Glucose-Capillary: 364 mg/dL — ABNORMAL HIGH (ref 70–99)
Glucose-Capillary: 163 mg/dL — ABNORMAL HIGH (ref 70–99)
Glucose-Capillary: 169 mg/dL — ABNORMAL HIGH (ref 70–99)

## 2013-09-17 MED ORDER — INSULIN ASPART 100 UNIT/ML ~~LOC~~ SOLN
3.0000 [IU] | Freq: Three times a day (TID) | SUBCUTANEOUS | Status: DC
  Administered 2013-09-17 (×2): 3 [IU] via SUBCUTANEOUS

## 2013-09-17 MED ORDER — INSULIN DETEMIR 100 UNIT/ML ~~LOC~~ SOLN
5.0000 [IU] | Freq: Every day | SUBCUTANEOUS | Status: DC
Start: 2013-09-17 — End: 2013-09-18
  Administered 2013-09-17: 5 [IU] via SUBCUTANEOUS
  Filled 2013-09-17 (×2): qty 0.05

## 2013-09-17 MED ORDER — VANCOMYCIN HCL 500 MG IV SOLR
500.0000 mg | INTRAVENOUS | Status: DC
  Administered 2013-09-17: 500 mg via INTRAVENOUS
  Filled 2013-09-17 (×2): qty 500

## 2013-09-17 MED ORDER — FUROSEMIDE 20 MG PO TABS
20.0000 mg | ORAL_TABLET | Freq: Every day | ORAL | Status: DC
  Administered 2013-09-17: 20 mg via ORAL
  Filled 2013-09-17: qty 1

## 2013-09-17 MED ORDER — INSULIN ASPART 100 UNIT/ML ~~LOC~~ SOLN
0.0000 [IU] | Freq: Three times a day (TID) | SUBCUTANEOUS | Status: DC
  Administered 2013-09-17: 2 [IU] via SUBCUTANEOUS

## 2013-09-17 MED ORDER — MAGNESIUM HYDROXIDE 400 MG/5ML PO SUSP
15.0000 mL | Freq: Once | ORAL | Status: AC
  Administered 2013-09-17: 15 mL via ORAL
  Filled 2013-09-17: qty 30

## 2013-09-17 NOTE — Progress Notes (Signed)
Utilization review completed.  

## 2013-09-17 NOTE — Evaluation (Signed)
Physical Therapy Evaluation Patient Details Name: Barbara Garner MRN: [id number] DOB: [birth year] Today's Date: 09/17/2013   History of Present Illness  79 y.o. female with past medical history of chronic CHF and hypertension came to the hospital because of shortness of breath.  Clinical Impression  Pt adm from home due to the above. Presents with mild balance deficits which attribute to decreased independence with functional mobility at this time. Pt to benefit from skilled acute PT to address deficits and maximize functional mobility and independence prior to D/C home. Will plan to assess pt gt with cane upon next session for balance.     Follow Up Recommendations No PT follow up;Supervision/Assistance - 24 hour    Equipment Recommendations  Cane    Recommendations for Other Services       Precautions / Restrictions Precautions Precautions: None Precaution Comments: denies any recent falls  Restrictions Weight Bearing Restrictions: No      Mobility  Bed Mobility Overal bed mobility: Modified Independent                Transfers Overall transfer level: Needs assistance Equipment used: None Transfers: Sit to/from Stand Sit to Stand: Supervision         General transfer comment: supervision for safety; pt with slight sway initially  Ambulation/Gait Ambulation/Gait assistance: Min guard Ambulation Distance (Feet): 150 Feet Assistive device: None Gait Pattern/deviations: Step-through pattern;Narrow base of support;Drifts right/left;Decreased stride length Gait velocity: impulsively fast at times; cues for safe gt speed   General Gait Details: pt unsteady at times and drifts in hallway; pt with poor vision in hallway and reports this is her baseline; min guard to steady and for safety; recommend use of cane to pt and family to steady primarily with outdoor/out of house ambulation   Stairs            Wheelchair Mobility    Modified Rankin (Stroke Patients  Only)       Balance Overall balance assessment: Needs assistance         Standing balance support: During functional activity;No upper extremity supported Standing balance-Leahy Scale: Fair Standing balance comment: + sway                             Pertinent Vitals/Pain 95% on RA; 92% after ambulation     Home Living Family/patient expects to be discharged to:: Private residence Living Arrangements: Children Available Help at Discharge: Family;Available 24 hours/day Type of Home: House Home Access: Stairs to enter Entrance Stairs-Rails:  (uncertain) Entrance Stairs-Number of Steps: 3 Home Layout: One level Home Equipment: Shower seat - built in Additional Comments: sons present at end of session and reported pt has someone with her 24/7 but pt is independent with ADLs and mobility     Prior Function Level of Independence: Independent               Hand Dominance        Extremity/Trunk Assessment   Upper Extremity Assessment: Defer to OT evaluation           Lower Extremity Assessment: Overall WFL for tasks assessed      Cervical / Trunk Assessment: Normal  Communication   Communication: No difficulties  Cognition Arousal/Alertness: Awake/alert Behavior During Therapy: WFL for tasks assessed/performed Overall Cognitive Status: Impaired/Different from baseline (family in at end of session; did not comment on pt baseline ) Area of Impairment: Orientation;Safety/judgement;Awareness;Problem solving Orientation Level: Disoriented to;Time  Memory: Decreased short-term memory   Safety/Judgement: Decreased awareness of safety;Decreased awareness of deficits   Problem Solving: Difficulty sequencing General Comments: family present at end of session; did not comment on pt baseline cognition; pt with difficulty word finding and processing instructions at times     General Comments      Exercises        Assessment/Plan    PT  Assessment Patient needs continued PT services  PT Diagnosis Abnormality of gait   PT Problem List Decreased balance;Decreased activity tolerance;Decreased mobility;Decreased cognition;Decreased safety awareness;Decreased knowledge of use of DME  PT Treatment Interventions DME instruction;Gait training;Stair training;Functional mobility training;Therapeutic activities;Balance training;Therapeutic exercise;Neuromuscular re-education;Patient/family education   PT Goals (Current goals can be found in the Care Plan section) Acute Rehab PT Goals Patient Stated Goal: to go home with sons PT Goal Formulation: With patient/family Time For Goal Achievement: 09/24/13 Potential to Achieve Goals: Good    Frequency Min 3X/week   Barriers to discharge        Co-evaluation               End of Session Equipment Utilized During Treatment: Gait belt Activity Tolerance: Patient tolerated treatment well Patient left: in bed;with call bell/phone within reach;with family/visitor present Nurse Communication: Mobility status         Time: [id number] PT Time Calculation (min): 15 min   Charges:   PT Evaluation $Initial PT Evaluation Tier I: 1 Procedure PT Treatments $Gait Training: 8-22 mins   PT G CodesGustavus Bryant, [place] 09/17/2013, 4:58 PM

## 2013-09-17 NOTE — Progress Notes (Addendum)
PROGRESS NOTE  Barbara Garner [id number] DOB: 08-01-1934 DOA: 09/16/2013 PCP: Loura Pardon, MD  HPI/Subjective: Pt states she is "feeling very well" this morning. Asked to get up and walk the hallways when possible. Denies SOB, coughing, or chest pain. Also asked for a laxative to help with BMs. No new complaints today.  Subjective "I want to go home"  Assessment/Plan: HCAP  CT scan showed right lower lobe PNA, pt been recently in the hospital so treating as HCAP, Continue cefepime and vancomycin per pharmacy Supportive management with mucolytics and antitussives, bronchodilators and oxygen prn Blood cultures show NGTD, will continue to monitor Leukocytosis 14.1 this am, pt remains afebrile   Acute on Chronic CHF  +1 pedal edema, elevated BNP in ED, received 80 mg of Lasix  patient on Lasix 80 mg IV 6/30, 40 mg IV 7/1, edema improving.  Na restricted diet, continue to follow clinically by daily weights.    Diabetes mellitus type 2  Hgb A1c was 6.3 on 08/09/2013, CBGs >300 this am Added levemir 5 units qhs + meal cover + SSI Carb modified diet, hold glipizide  CKD stage 3 BUN 40, Cr 1.32 - appears stable, not far from baseline Will continue to monitor given that she is on vancomycin.  Hypothyroidism  TSH pending  DVT Prophylaxis: SQ Heparin  Code Status:  Full code   Family Communication:  Son at bedside.  Disposition Plan:   Remain inpatient   Objective: Filed Vitals:   09/16/13 1857 09/16/13 2150 09/17/13 0417 09/17/13 0921  BP: 143/65 149/62 136/76 149/77  Pulse: 64 64 66 64  Temp:  97.9 F (36.6 C) 98 F (36.7 C)   TempSrc:  Oral Oral   Resp:  16 12   Height:      Weight:      SpO2:  99% 94%     Intake/Output Summary (Last 24 hours) at 09/17/13 1114 Last data filed at 09/17/13 0857  Gross per 24 hour  Intake    240 ml  Output    800 ml  Net   -560 ml   Filed Weights   09/16/13 1059  Weight: 51.256 kg (113 lb)    Exam: General: Well  developed, well nourished, NAD, appears stated age, pleasant HEENT:  PERR, EOMI, Anicteic Sclera, MMM. No pharyngeal erythema or exudates  Neck: Supple, no JVD, no masses  Cardiovascular: RRR, S1 S2, no m/g/r, no wheezes Respiratory: CTAB with equal chest rise  Abdomen: Soft, nontender, nondistended, + bowel sounds  Extremities: warm, dry, without cyanosis or edema.  Neuro: AAOx3, cranial nerves grossly intact. Strength 5/5 in upper and lower extremities  Skin: Without rashes or exudates Psych: Normal affect and demeanor with intact judgement and insight   Data Reviewed: Basic Metabolic Panel:  Recent Labs Lab 09/16/13 1120 09/17/13 0810  NA 140 140  K 4.2 4.2  CL 102 97  CO2 24 26  GLUCOSE 196* 343*  BUN 32* 40*  CREATININE 1.10 1.32*  CALCIUM 9.1 8.9   Liver Function Tests:  Recent Labs Lab 09/16/13 1120  AST 16  ALT 15  ALKPHOS 103  BILITOT 0.6  PROT 7.3  ALBUMIN 3.6   CBC:  Recent Labs Lab 09/16/13 1120 09/17/13 0810  WBC 15.4* 14.1*  NEUTROABS 9.7*  --   HGB 11.3* 11.4*  HCT 34.2* 34.9*  MCV 90.2 89.3  PLT 224 229   Cardiac Enzymes:  Recent Labs Lab 09/16/13 1120  TROPONINI <0.30   BNP (last 3 results)  Recent Labs  08/09/13 0850 09/16/13 1120  PROBNP 7547.0* 11854.0*   CBG:  Recent Labs Lab 09/16/13 1717 09/16/13 2139 09/17/13 0802  GLUCAP 271* 326* 364*    Recent Results (from the past 240 hour(s))  CULTURE, BLOOD (ROUTINE X 2)     Status: None   Collection Time    09/16/13  1:40 PM      Result Value Ref Range Status   Specimen Description BLOOD LEFT WRIST   Final   Special Requests BOTTLES DRAWN AEROBIC ONLY 3CC   Final   Culture  Setup Time     Final   Value: 09/16/2013 18:45     Performed at [facility]   Culture     Final   Value:        BLOOD CULTURE RECEIVED NO GROWTH TO DATE CULTURE WILL BE HELD FOR 5 DAYS BEFORE ISSUING A FINAL NEGATIVE REPORT     Performed at [facility]   Report Status  PENDING   Incomplete     Studies: Ct Chest Wo Contrast  09/16/2013   CLINICAL DATA:  Three-day history of shortness of breath  EXAM: CT CHEST WITHOUT CONTRAST  TECHNIQUE: Multidetector CT imaging of the chest was performed following the standard protocol without IV contrast.  COMPARISON:  Portable chest x-ray of September 16, 2013  FINDINGS: There are small bilateral pleural effusions layering posteriorly. There is basilar atelectasis versus pneumonia on the right with minimal atelectasis at the left lung base. There is a large hiatal hernia -partially intra thoracic stomach. The cardiac chambers are normal in size. The patient has undergone previous CABG and permanent pacemaker placement. There is no pericardial effusion. The caliber of the thoracic aorta is normal.  The thoracic vertebral bodies are preserved in height. There is diffuse narrowing of the disc spaces. The observed portions of the rib cage exhibit no acute abnormalities.  Within the upper abdomen the observed portions of the liver and spleen are unremarkable.  IMPRESSION: 1. There are small bilateral pleural effusions with bibasilar atelectasis and right basilar pneumonia. There is no evidence of pulmonary edema. 2. There is a large hiatal hernia-partially intra thoracic stomach.   Electronically Signed   By: David  Martinique   On: 09/16/2013 13:19   Dg Chest Port 1 View  09/16/2013   CLINICAL DATA:  Shortness of Breath  EXAM: PORTABLE CHEST - 1 VIEW  COMPARISON:  08/09/2013  FINDINGS: Central mild vascular congestion. Cardiomegaly again noted. Dual lead cardiac pacemaker is unchanged in position. Mild perihilar interstitial prominence without convincing pulmonary edema. Bilateral small pleural effusion with bilateral basilar atelectasis or infiltrate.  IMPRESSION: Cardiomegaly. Central vascular congestion without convincing pulmonary edema. Bilateral small pleural effusion with bilateral basilar atelectasis or infiltrate.   Electronically Signed    By: Lahoma Crocker M.D.   On: 09/16/2013 12:07    Scheduled Meds: . antiseptic oral rinse  15 mL Mouth Rinse BID  . aspirin EC  81 mg Oral Daily  . carvedilol  6.25 mg Oral BID WC  . ceFEPime (MAXIPIME) IV  1 g Intravenous Q24H  . furosemide  40 mg Intravenous Daily  . furosemide  20 mg Oral Daily  . guaiFENesin  1,200 mg Oral BID  . heparin  5,000 Units Subcutaneous 3 times per day  . insulin aspart  0-9 Units Subcutaneous TID WC  . insulin aspart  0-9 Units Subcutaneous TID WC  . insulin aspart  3 Units Subcutaneous TID WC  . insulin detemir  5 Units Subcutaneous QHS  . levothyroxine  50 mcg Oral QAC breakfast  . sodium chloride  3 mL Intravenous Q12H  . vancomycin  500 mg Intravenous Q24H   Continuous Infusions:   Principal Problem:   HCAP (healthcare-associated pneumonia) Active Problems:   Chronic diastolic CHF (congestive heart failure)   HYPOTHYROIDISM   DIABETES MELLITUS, TYPE II   GERD   Tammi Klippel, PA-S Imogene Burn, PA-C  [facility] Pager [phone]. If 7PM-7AM, please contact night-coverage at [email or web address], password [facility] 09/17/2013, 11:14 AM  LOS: 1 day   Attending Patient was seen, examined,treatment plan was discussed with the Physician extender. I have directly reviewed the clinical findings, lab, imaging studies and management of this patient in detail. I have made the necessary changes to the above noted documentation, and agree with the documentation, as recorded by the Physician extender.  Nena Alexander MD [facility].

## 2013-09-17 NOTE — Progress Notes (Signed)
Inpatient Diabetes Program Recommendations  AACE/ADA: New Consensus Statement on Inpatient Glycemic Control (2013)  Target Ranges:  Prepandial:   less than 140 mg/dL      Peak postprandial:   less than 180 mg/dL (1-2 hours)      Critically ill patients:  140 - 180 mg/dL   Reason for Assessment:  Results for Barbara Garner, Barbara Garner (MRN [id number]) as of 09/17/2013 11:02  Ref. Range 09/16/2013 17:17 09/16/2013 21:39 09/17/2013 08:02  Glucose-Capillary Latest Range: 70-99 mg/dL 271 (H) 326 (H) 364 (H)  Results for Barbara Garner, Barbara Garner (MRN [id number]) as of 09/17/2013 11:02  Ref. Range 08/09/2013 13:50  Hemoglobin A1C Latest Range: <5.7 % 6.3 (H)   Diabetes history: Type 2 Diabetes Outpatient Diabetes medications: Glucotrol 10 mg bid Current orders for Inpatient glycemic control: Note orders just updated.  Levemir 5 units q HS, Novolog 3 units tid with meals, and Novolog sensitive tid with meals. Agree with order changes.  Will follow.   Adah Perl, RN, BC-ADM Inpatient Diabetes Coordinator Pager [phone]

## 2013-09-17 NOTE — Progress Notes (Signed)
ANTIBIOTIC CONSULT NOTE - FOLLOW UP  Pharmacy Consult:  Vancomycin Indication: rule out pneumonia  Allergies  Allergen Reactions  . Metformin     REACTION: intolerant  . Promethazine Hcl     REACTION: u/k    Patient Measurements: Height: 5\' 3"  (160 cm) Weight: 113 lb (51.256 kg) IBW/kg (Calculated) : 52.4  Vital Signs: Temp: 98 F (36.7 C) (07/01 0417) Temp src: Oral (07/01 0417) BP: 149/77 mmHg (07/01 0921) Pulse Rate: 64 (07/01 0921) Intake/Output from previous day: 06/30 0701 - 07/01 0700 In: -  Out: 800 [Urine:800] Intake/Output from this shift: Total I/O In: 240 [P.O.:240] Out: -   Labs:  Recent Labs  09/16/13 1120 09/17/13 0810  WBC 15.4* 14.1*  HGB 11.3* 11.4*  PLT 224 229  CREATININE 1.10 1.32*   Estimated Creatinine Clearance: 28.9 ml/min (by C-G formula based on Cr of 1.32). No results found for this basename: VANCOTROUGH, VANCOPEAK, VANCORANDOM, [place], GENTPEAK, GENTRANDOM, TOBRATROUGH, TOBRAPEAK, TOBRARND, AMIKACINPEAK, AMIKACINTROU, AMIKACIN,  in the last 72 hours   Microbiology: Recent Results (from the past 720 hour(s))  CULTURE, BLOOD (ROUTINE X 2)     Status: None   Collection Time    09/16/13  1:40 PM      Result Value Ref Range Status   Specimen Description BLOOD LEFT WRIST   Final   Special Requests BOTTLES DRAWN AEROBIC ONLY 3CC   Final   Culture  Setup Time     Final   Value: 09/16/2013 18:45     Performed at [facility]   Culture     Final   Value:        BLOOD CULTURE RECEIVED NO GROWTH TO DATE CULTURE WILL BE HELD FOR 5 DAYS BEFORE ISSUING A FINAL NEGATIVE REPORT     Performed at [facility]   Report Status PENDING   Incomplete      Assessment: 48 YOF with HCAP started on broad spectrum antibiotics.  Patient's renal function is declining.  Vanc 6/30 >> Cefepime 6/30 >>  6/30 BCx x2 - NGTD 6/30 UCx -   Goal of Therapy:  Vancomycin trough level 15-20 mcg/ml   Plan:  - Change Vanc to 500mg  IV  Q24H - Continue Cefepime 1gm IV Q24H - Monitor renal fxn closely to adjust abx - F/U resume home meds, CBGs    Eyad Rochford D. Mina Marble, PharmD, BCPS Pager:  [phone] 09/17/2013, 10:13 AM

## 2013-09-18 ENCOUNTER — Telehealth: Payer: Self-pay

## 2013-09-18 ENCOUNTER — Other Ambulatory Visit: Payer: Self-pay

## 2013-09-18 DIAGNOSIS — I10 Essential (primary) hypertension: Secondary | ICD-10-CM

## 2013-09-18 DIAGNOSIS — E039 Hypothyroidism, unspecified: Secondary | ICD-10-CM

## 2013-09-18 LAB — CBC
HCT: 35.6 % — ABNORMAL LOW (ref 36.0–46.0)
Hemoglobin: 11.6 g/dL — ABNORMAL LOW (ref 12.0–15.0)
MCH: 29.4 pg (ref 26.0–34.0)
MCHC: 32.6 g/dL (ref 30.0–36.0)
MCV: 90.1 fL (ref 78.0–100.0)
Platelets: 216 10*3/uL (ref 150–400)
RBC: 3.95 MIL/uL (ref 3.87–5.11)
RDW: 16.5 % — AB (ref 11.5–15.5)
WBC: 13.5 10*3/uL — AB (ref 4.0–10.5)

## 2013-09-18 LAB — URINE CULTURE: Colony Count: >=100000

## 2013-09-18 LAB — GLUCOSE, CAPILLARY
Glucose-Capillary: 99 mg/dL (ref 70–99)
Glucose-Capillary: 90 mg/dL (ref 70–99)

## 2013-09-18 LAB — BASIC METABOLIC PANEL
Anion gap: 12 (ref 5–15)
BUN: 43 mg/dL — ABNORMAL HIGH (ref 6–23)
CHLORIDE: 99 meq/L (ref 96–112)
CO2: 31 mEq/L (ref 19–32)
Calcium: 8.5 mg/dL (ref 8.4–10.5)
Creatinine, Ser: 1.19 mg/dL — ABNORMAL HIGH (ref 0.50–1.10)
GFR calc non Af Amer: 43 mL/min — ABNORMAL LOW (ref >90)
GFR, EST AFRICAN AMERICAN: 50 mL/min — AB (ref >90)
Glucose, Bld: 145 mg/dL — ABNORMAL HIGH (ref 70–99)
Potassium: 3.4 mEq/L — ABNORMAL LOW (ref 3.7–5.3)
Sodium: 142 mEq/L (ref 137–147)

## 2013-09-18 MED ORDER — POTASSIUM CHLORIDE CRYS ER 20 MEQ PO TBCR
40.0000 meq | EXTENDED_RELEASE_TABLET | Freq: Once | ORAL | Status: AC
  Administered 2013-09-18: 40 meq via ORAL
  Filled 2013-09-18: qty 2

## 2013-09-18 MED ORDER — FUROSEMIDE 40 MG PO TABS: 40.0000 mg | ORAL_TABLET | Freq: Every day | ORAL | Status: DC

## 2013-09-18 MED ORDER — POTASSIUM CHLORIDE ER 20 MEQ PO TBCR: 20.0000 meq | EXTENDED_RELEASE_TABLET | Freq: Every day | ORAL | Status: DC

## 2013-09-18 MED ORDER — LEVOFLOXACIN 500 MG PO TABS: 500.0000 mg | ORAL_TABLET | Freq: Every day | ORAL | Status: DC

## 2013-09-18 NOTE — Telephone Encounter (Signed)
Barbara Garner pts son had left v/m questions about meds. Returned call to pt's son and Barbara Garner had spoken to cardiologist office and got med refilled thru cardiologist. No further need at this time.

## 2013-09-18 NOTE — Discharge Instructions (Signed)
Low Salt Diet.  Please complete your antibiotic. Lasix has been increased as your heart failure has worsened slightly.  You will need to take potassium with the lasix. Please follow up with both your PCP and Cardiologist in 1-2 weeks.

## 2013-09-18 NOTE — Discharge Summary (Signed)
Physician Discharge Summary  Barbara Garner [id number] DOB: 1934-05-09 DOA: 09/16/2013  PCP: Loura Pardon, MD  Admit date: 09/16/2013 Discharge date: 09/18/2013  Time spent: 60 minutes  Recommendations for Outpatient Follow-up:  1. BMET/CBC/BNP at hospital follow up.  Lasix increased.  Please ensure WBC has normalized. 2. D/C'd on Levaquin.  Treated for [facility] PNA.  Discharge Diagnoses:  Principal Problem:   HCAP (healthcare-associated pneumonia) Active Problems:   HYPOTHYROIDISM   DIABETES MELLITUS, TYPE II   GERD   Chronic diastolic CHF (congestive heart failure)   Discharge Condition: Stable  Diet recommendation: Heart healthy, low sodium  Filed Weights   09/16/13 1059 09/17/13 1416 09/18/13 0526  Weight: 51.256 kg (113 lb) 53.388 kg (117 lb 11.2 oz) 51.755 kg (114 lb 1.6 oz)    History of present illness:  Barbara Garner is a 79 y.o. female with past medical history of CHF, DM, HTN, who presented to the ED with SOB worsening over the last 3 days.  History was difficult to obtain, as pt appeared to be in some respiratory distress and was not able to talk in complete sentences. Pt was also seen at [facility] in May for SOB, with an admitting diagnosis NSTEMI and discharge diagnosis of CHF exacerbation. Pt is followed by [facility] and [facility]. Denies sweats, not sure about fever, and denies chest pain.   In the ED, pt was hypoxic and put on 2 L of oxygen to keep O2 sats in the mid 90s. CXR showed no abnormalities so CT scan was done, which showed right lower lobe pneumonia. Pt also has elevated BNP without clear pulmonary edema on the CT scan. Pt admitted to [facility] for further management.   Hospital Course:   HCAP  CT scan showed right lower lobe PNA, pt been recently in the hospital so treating as HCAP,  Continue cefepime and vancomycin per pharmacy .  Changed to Levaquin on discharge. Supportive management with mucolytics and antitussives,  bronchodilators and oxygen prn  Blood cultures show NGTD, will continue to monitor  Leukocytosis downtrending at 13.5 this am, pt remains afebrile and significantly improved clinically.  Acute on Chronic Diastolic CHF  +1 pedal edema, elevated BNP in ED, received 80 mg of Lasix  Patient on Lasix 80 mg IV 6/30, 40 mg IV 7/1, edema improving Na restricted diet, followed clinically by daily weights Will discharge on lasix 40 mg daily and potassium  Diabetes mellitus type 2  Hgb A1c was 6.3 on 08/09/2013, CBGs >300 7/1  Added levemir 5 units qhs + meal cover + SSI as an inpatient. Carb modified diet, hold glipizide inpatient. CBG 90 this am, resume glipizide upon discharge  CKD stage 3  BUN 43, Cr 1.19 - appears stable, approximately at baseline  Closely monitored while pt on vancomycin   Discharge Exam: Filed Vitals:   09/18/13 0526  BP: 171/80  Pulse: 73  Temp: 97.8 F (36.6 C)  Resp: 18    General: Well developed, well nourished, NAD, appears stated age, eager to go home. Son at bedside. HEENT: PERR, EOMI, Anicteic Sclera, MMM.  Neck: Supple, no JVD, no masses  Cardiovascular: RRR, S1 S2, no m/g/r, no wheezes or ronchi Respiratory: CTAB with equal chest rise  Abdomen: Soft, nontender, nondistended, +BS Extremities: warm, dry, without cyanosis or edema.  Neuro: AAOx3 Skin: Without rashes or exudates  Psych: Normal affect and demeanor with intact judgement and insight       Discharge Instructions   Diet -  low sodium heart healthy    Complete by:  As directed      Increase activity slowly    Complete by:  As directed             Medication List         aspirin 81 MG EC tablet  Take 1 tablet (81 mg total) by mouth daily.     atorvastatin 40 MG tablet  Commonly known as:  LIPITOR  Take 40 mg by mouth daily.     carvedilol 12.5 MG tablet  Commonly known as:  COREG  Take 18.72 mg by mouth 2 (two) times daily with a meal.     fluticasone 50 MCG/ACT nasal  spray  Commonly known as:  FLONASE  Place 2 sprays into both nostrils daily.     furosemide 40 MG tablet  Commonly known as:  LASIX  Take 1 tablet (40 mg total) by mouth daily.     glipiZIDE 10 MG 24 hr tablet  Commonly known as:  GLUCOTROL XL  Take 10 mg by mouth 2 (two) times daily.     levofloxacin 500 MG tablet  Commonly known as:  LEVAQUIN  Take 1 tablet (500 mg total) by mouth daily.     levothyroxine 50 MCG tablet  Commonly known as:  SYNTHROID, LEVOTHROID  Take 50 mcg by mouth daily before breakfast.     omeprazole 20 MG capsule  Commonly known as:  PRILOSEC  Take 20 mg by mouth daily.     OVER THE COUNTER MEDICATION  Apply 1 application topically daily as needed (for stiffness in neck). "Muscle Reliever cream"     Potassium Chloride ER 20 MEQ Tbcr  Take 20 mEq by mouth daily. Take when taking lasix.     tolterodine 2 MG 24 hr capsule  Commonly known as:  DETROL LA  Take 1 capsule (2 mg total) by mouth daily.     Vitamin D-3 1000 UNITS Caps  Take 1 capsule by mouth daily.       Allergies  Allergen Reactions  . Metformin     REACTION: intolerant  . Promethazine Hcl     REACTION: u/k   Follow-up Information   Follow up with Loura Pardon, MD On 09/25/2013. (Appointment with Dr. Glori Bickers is 09/25/13 at 09:00am)    Specialties:  Family Medicine, Radiology   Contact information:   [address] [place] [address] [phone]       Follow up with Loralie Champagne, MD In 1 week.   Specialty:  Cardiology   Contact information:   [id number] [place] [address] [phone]        The results of significant diagnostics from this hospitalization (including imaging, microbiology, ancillary and laboratory) are listed below for reference.    Significant Diagnostic Studies: Ct Chest Wo Contrast  09/16/2013   CLINICAL DATA:  Three-day history of shortness of breath  EXAM: CT CHEST WITHOUT CONTRAST  TECHNIQUE:  Multidetector CT imaging of the chest was performed following the standard protocol without IV contrast.  COMPARISON:  Portable chest x-ray of September 16, 2013  FINDINGS: There are small bilateral pleural effusions layering posteriorly. There is basilar atelectasis versus pneumonia on the right with minimal atelectasis at the left lung base. There is a large hiatal hernia -partially intra thoracic stomach. The cardiac chambers are normal in size. The patient has undergone previous CABG and permanent pacemaker placement. There is no pericardial effusion.  The caliber of the thoracic aorta is normal.  The thoracic vertebral bodies are preserved in height. There is diffuse narrowing of the disc spaces. The observed portions of the rib cage exhibit no acute abnormalities.  Within the upper abdomen the observed portions of the liver and spleen are unremarkable.  IMPRESSION: 1. There are small bilateral pleural effusions with bibasilar atelectasis and right basilar pneumonia. There is no evidence of pulmonary edema. 2. There is a large hiatal hernia-partially intra thoracic stomach.   Electronically Signed   By: David  Martinique   On: 09/16/2013 13:19   Dg Chest Port 1 View  09/16/2013   CLINICAL DATA:  Shortness of Breath  EXAM: PORTABLE CHEST - 1 VIEW  COMPARISON:  08/09/2013  FINDINGS: Central mild vascular congestion. Cardiomegaly again noted. Dual lead cardiac pacemaker is unchanged in position. Mild perihilar interstitial prominence without convincing pulmonary edema. Bilateral small pleural effusion with bilateral basilar atelectasis or infiltrate.  IMPRESSION: Cardiomegaly. Central vascular congestion without convincing pulmonary edema. Bilateral small pleural effusion with bilateral basilar atelectasis or infiltrate.   Electronically Signed   By: Lahoma Crocker M.D.   On: 09/16/2013 12:07    Microbiology: Recent Results (from the past 240 hour(s))  URINE CULTURE     Status: None   Collection Time    09/16/13  1:36  PM      Result Value Ref Range Status   Specimen Description URINE, CATHETERIZED   Final   Special Requests NONE   Final   Culture  Setup Time     Final   Value: 09/17/2013 02:48     Performed at [place]     Final   Value: >=100,000 COLONIES/ML     Performed at [facility]   Culture     Final   Value: ESCHERICHIA COLI     Performed at [facility]   Report Status PENDING   Incomplete  CULTURE, BLOOD (ROUTINE X 2)     Status: None   Collection Time    09/16/13  1:40 PM      Result Value Ref Range Status   Specimen Description BLOOD LEFT WRIST   Final   Special Requests BOTTLES DRAWN AEROBIC ONLY 3CC   Final   Culture  Setup Time     Final   Value: 09/16/2013 18:45     Performed at [facility]   Culture     Final   Value:        BLOOD CULTURE RECEIVED NO GROWTH TO DATE CULTURE WILL BE HELD FOR 5 DAYS BEFORE ISSUING A FINAL NEGATIVE REPORT     Performed at [facility]   Report Status PENDING   Incomplete     Labs: Basic Metabolic Panel:  Recent Labs Lab 09/16/13 1120 09/17/13 0810 09/18/13 0424  NA 140 140 142  K 4.2 4.2 3.4*  CL 102 97 99  CO2 24 26 31   GLUCOSE 196* 343* 145*  BUN 32* 40* 43*  CREATININE 1.10 1.32* 1.19*  CALCIUM 9.1 8.9 8.5   Liver Function Tests:  Recent Labs Lab 09/16/13 1120  AST 16  ALT 15  ALKPHOS 103  BILITOT 0.6  PROT 7.3  ALBUMIN 3.6   CBC:  Recent Labs Lab 09/16/13 1120 09/17/13 0810 09/18/13 0424  WBC 15.4* 14.1* 13.5*  NEUTROABS 9.7*  --   --   HGB 11.3* 11.4* 11.6*  HCT 34.2* 34.9* 35.6*  MCV 90.2 89.3 90.1  PLT 224 229 216   Cardiac Enzymes:  Recent Labs Lab 09/16/13 1120  TROPONINI <0.30   BNP: BNP (last 3 results)  Recent Labs  08/09/13 0850 09/16/13 1120  PROBNP 7547.0* 11854.0*   CBG:  Recent Labs Lab 09/17/13 0802 09/17/13 1155 09/17/13 1650 09/17/13 2118 09/18/13 0749  GLUCAP 364* 163* 169* 99 90        Signed:  Tammi Klippel, PA-S [place], [place] [phone] [facility] 09/18/2013, 11:43 AM  Attending Patient was seen, examined,treatment plan was discussed with the Physician extender. I have directly reviewed the clinical findings, lab, imaging studies and management of this patient in detail. I have made the necessary changes to the above noted documentation, and agree with the documentation, as recorded by the Physician extender.  Nena Alexander MD [facility].

## 2013-09-18 NOTE — Progress Notes (Signed)
Patient was discharged home by MD order; discharged instructions  review and give to patient and her son with care notes and prescriptions; IV DIC; skin intact; patient will be escorted to the car by volunteer via wheelchair.

## 2013-09-22 ENCOUNTER — Telehealth: Payer: Self-pay | Admitting: *Deleted

## 2013-09-22 LAB — CULTURE, BLOOD (ROUTINE X 2): Culture: NO GROWTH

## 2013-09-22 NOTE — Telephone Encounter (Signed)
We will see how she is doing tomorrow and go from there - I agree to seek care tonight if condition worsens

## 2013-09-22 NOTE — Telephone Encounter (Signed)
Dr. Glori Bickers sent me a message saying   "Pt has f/u planned with me on 7/9 Please make transitional care call Monday to see how she is feeling She was d/c today"  Called pt and son answered, son was concerned with his mother he said she just doesn't seem right. He isn't sure if it is because her medication is off and he doesn't think she was taking all the Rxs she should have. Son has scheduled a f/u appt with you tomorrow (72min appt) 09/23/13 to discuss his issues, but pt also has a hospital f/u with [facility]mith on 09/25/13 and isn't sure if he needs to keep that appt for pt or not. I advise son if pt's sxs worsen or she seems like she is in distress that he needs to take her to UC or back to ER, son verbalized understanding and said if not he will keep the f/u appt with you tomorrow

## 2013-09-23 ENCOUNTER — Ambulatory Visit (INDEPENDENT_AMBULATORY_CARE_PROVIDER_SITE_OTHER): Payer: Medicare Other | Admitting: Family Medicine

## 2013-09-23 ENCOUNTER — Encounter: Payer: Self-pay | Admitting: Family Medicine

## 2013-09-23 VITALS — BP 130/78 | HR 89 | Temp 98.4°F | Ht 62.0 in

## 2013-09-23 DIAGNOSIS — T50901A Poisoning by unspecified drugs, medicaments and biological substances, accidental (unintentional), initial encounter: Secondary | ICD-10-CM | POA: Insufficient documentation

## 2013-09-23 DIAGNOSIS — Z5189 Encounter for other specified aftercare: Secondary | ICD-10-CM

## 2013-09-23 DIAGNOSIS — N3000 Acute cystitis without hematuria: Secondary | ICD-10-CM

## 2013-09-23 DIAGNOSIS — E119 Type 2 diabetes mellitus without complications: Secondary | ICD-10-CM

## 2013-09-23 DIAGNOSIS — T50904D Poisoning by unspecified drugs, medicaments and biological substances, undetermined, subsequent encounter: Secondary | ICD-10-CM

## 2013-09-23 DIAGNOSIS — I509 Heart failure, unspecified: Secondary | ICD-10-CM

## 2013-09-23 DIAGNOSIS — I10 Essential (primary) hypertension: Secondary | ICD-10-CM

## 2013-09-23 DIAGNOSIS — J189 Pneumonia, unspecified organism: Secondary | ICD-10-CM

## 2013-09-23 DIAGNOSIS — I5032 Chronic diastolic (congestive) heart failure: Secondary | ICD-10-CM

## 2013-09-23 DIAGNOSIS — N184 Chronic kidney disease, stage 4 (severe): Secondary | ICD-10-CM

## 2013-09-23 LAB — POCT URINALYSIS DIPSTICK
BILIRUBIN UA: NEGATIVE
GLUCOSE UA: NEGATIVE
LEUKOCYTES UA: NEGATIVE
Nitrite, UA: NEGATIVE
SPEC GRAV UA: 1.01
Urobilinogen, UA: 0.2
pH, UA: 6.5

## 2013-09-23 NOTE — Assessment & Plan Note (Signed)
Acute exacerbation in hosp when tx for HCAP 80 lasix resolved swelling Now on 40  Will f/u with cardiol as planned and update if symptoms return

## 2013-09-23 NOTE — Assessment & Plan Note (Signed)
bp improved from hosp d/c  Will continue to follow  Lab today

## 2013-09-23 NOTE — Assessment & Plan Note (Signed)
Lab today ecoli in urine during hosp  On levaquin for this and HCAP  May have accidentally had 2 doses in one day Lab today

## 2013-09-23 NOTE — Patient Instructions (Addendum)
Labs today  Try to give urine for a urinalysis if she can  Hold all medicines today Hold the detrol indefinitely  Do not start back with anything until we contact you about labs tomorrow  If worse - take to the emergency room if after hours

## 2013-09-23 NOTE — Progress Notes (Signed)
Pre visit review using our clinic review tool, if applicable. No additional management support is needed unless otherwise documented below in the visit note. 

## 2013-09-23 NOTE — Assessment & Plan Note (Signed)
Glucose high in hospital with infx  On glipizide alone now  Family is not checking glucose  She may have had 2 doses in one day - glucose check today Appetite improved today

## 2013-09-23 NOTE — Assessment & Plan Note (Signed)
Clinically improved - with no further sob or cough On levaquin Cbc today for elevated wbc Rev hosp records and studies in detail

## 2013-09-23 NOTE — Assessment & Plan Note (Signed)
ecoli in urine in hospital On levaquin Has renal insuff  Lab today  ua fairly clear but with protein  Results for orders placed in visit on 09/23/13  POCT URINALYSIS DIPSTICK      Result Value Ref Range   Color, UA yellow     Clarity, UA hazy     Glucose, UA neg.     Bilirubin, UA neg.     Ketones, UA Small     Spec Grav, UA 1.010     Blood, UA Trace     pH, UA 6.5     Protein, UA 100++     Urobilinogen, UA 0.2     Nitrite, UA neg.     Leukocytes, UA Negative

## 2013-09-23 NOTE — Progress Notes (Signed)
Subjective:    Patient ID: Barbara Garner, female    DOB: 03-26-1934, 79 y.o.   MRN: [id number]  HPI Here s/p hospitalization from 6/30 to 09/18/13 for healthcare assoc pneumonia and acute on chronic CHF  She had presented originally to ER with sob for 3 d with hypoxia , some resp distress    (of note was hosp in May for NSTEMI and CHF as well) CXR neg but CT scan showed RLL pneumonia  Also had elevated BNP  tx for pneumonia with cefepime an dvanco then changed to levaquin on d/c  Had bronchidialators prn Blood cx neg Had leukocytosis that downtrended to 13.5 at discharge  CHF - had 80 mg lasix for pedal edema that imp D/c on 40 mg daily with K   Dm was covered in hosp-did worsen in resp to her infection   CKD stayed stable with cr of 1.19    Incidentally had e coli in urine as well   bp was elevated at d/c but imp today at 130/78   Was to f/u with me on 7/9 (here early) and f/u with Dr Aundra Dubin on 7/9  Since then - sob is improved with good pulse ox today  Swelling in legs went down a lot  Her family took over meds - and they noted she got a "double dose" ? Perhaps her care give (Jody)- did this on Sunday or yesterday  Since then - had a few good days - then Sunday she became worse  Got flushed and then pale  Very weak-not walking well/ need support to stand up and get to the bathroom Was even having trouble communicating -was just dazed and seems sedated (no slurring or stroke symptoms)   Today - seems to be perking up a bit  A little nausea yesterday - decreased appetite-improved this am    Moved in with family 2 weeks ago  Then had colonosc - and problems began right after that (within days)- was admitted     Patient Active Problem List   Diagnosis Date Noted  . HCAP (healthcare-associated pneumonia) 09/16/2013  . Acute and chronic respiratory failure 08/09/2013  . Acute respiratory failure 08/09/2013  . Urge incontinence of urine 07/16/2013  . Urine  incontinence 02/07/2013  . Urinary frequency 02/07/2013  . UTI (urinary tract infection) 02/07/2013  . Sinus bradycardia 09/24/2012  . Vitamin D deficiency disease 08/23/2012  . Lichen sclerosus et atrophicus of the vulva 04/05/2012  . Acute back pain 03/22/2012  . Chronic diastolic CHF (congestive heart failure) 11/15/2011  . Pacemaker-[employer]tr[employer] 09/13/2011  . Atrial fibrillation 09/12/2011  . Complete heart block-intermittent 09/09/2011  . S/P aortic valve replacement 09/06/2011  . S/P CABG x 3 09/06/2011  . IVCD (intraventricular conduction defect)   . CKD (chronic kidney disease) stage 4, GFR 15-29 ml/min   . Carotid stenosis 05/30/2011  . Squamous cell carcinoma of skin 07/21/2010  . INTERTRIGO, CANDIDAL 03/24/2010  . MEMORY LOSS 08/19/2009  . HYPERCHOLESTEROLEMIA, PURE 09/20/2006  . HYPOTHYROIDISM 06/27/2006  . DIABETES MELLITUS, TYPE II 06/27/2006  . ANEMIA-IRON DEFICIENCY 06/27/2006  . PERIPHERAL NEUROPATHY 06/27/2006  . HYPERTENSION 06/27/2006  . MYOCARDIAL INFARCTION, HX OF 06/27/2006  . CORONARY ARTERY DISEASE 06/27/2006  . ASTHMA 06/27/2006  . GERD 06/27/2006  . PNEUMONIA, HX OF 06/27/2006  . ECZEMA 06/27/2006   Past Medical History  Diagnosis Date  . Iron deficiency anemia, unspecified   . Chronic diastolic CHF (congestive heart failure)   . CAD  S/P CABG x  3 08/2011     LIMA to diagonal branch, SVG to OM1, SVG to PDA, EVH via bilateral thighs  . Type II or unspecified type diabetes mellitus without mention of complication, not stated as uncontrolled   . Contact dermatitis and other eczema, due to unspecified cause   . Esophageal reflux   . Headache(784.0)   . Pacemaker MDT dual chamber     DOI 08/2011  . Pure hypercholesterolemia   . hypertension   . Hypothyroidism   . Atrioventricular block, complete -intermittent   . Memory loss   . Idiopathic peripheral neuropathy   . LBBB (left bundle branch block)   . Carotid stenosis     Carotid dopplers 0000000 with  [id number] LICA stenosis  . Severe aortic stenosis -S/P AVR      19 mm [facility] Ease pericardial tissue valve  . Extrinsic asthma, unspecified     hasn't used inhaler in past year  . CKD (chronic kidney disease) stage 4, GFR 15-29 ml/min     Cr 1.6 in 6/11, sees Dr. Arty Baumgartner  . Impaired vision     pt. reports that she identifies her meds by looking at the pillls, she is not able to read labels on bottles   Past Surgical History  Procedure Laterality Date  . Ptca  [phone]    5 blockages  . Cardiac catheterization    . Esophageal dilation  1997  . Colonoscopy  04/1999    1 polyp  . Esophagogastroduodenoscopy  04/1999    HH; "watermelon stomach" (severe gastritis)  . Dexa  10/2003 ,09/2005    osteoporosis,  Osteopenia  . Cataract extraction, bilateral  2003  . Esophagogastroduodenoscopy  04/2002    Gastritis  . Cardiovascular stress test  10/2002    normal (per patient)  . Abdominal hysterectomy  1977    partial; fibroids  . Bladder tack  1977  . Colonoscopy  06/2004    Neg. Int hem  . Carotid dopplers  2006  . Refractive surgery  2006  . Opthy  11/1998;12/01;11/02  . Aortic valve replacement  09/06/2011    Procedure: AORTIC VALVE REPLACEMENT (AVR);  Surgeon: Rexene Alberts, MD;  Location: [place];  Service: Open Heart Surgery;  Laterality: N/A;  . Coronary artery bypass graft  09/06/2011    Procedure: CORONARY ARTERY BYPASS GRAFTING (CABG);  Surgeon: Rexene Alberts, MD;  Location: [place];  Service: Open Heart Surgery;  Laterality: N/A;   History  Substance Use Topics  . Smoking status: Never Smoker   . Smokeless tobacco: Never Used  . Alcohol Use: No   Family History  Problem Relation Age of Onset  . Stroke Father     died in his 72's  . Stroke Mother     died in her 80's   Allergies  Allergen Reactions  . Metformin     REACTION: intolerant  . Promethazine Hcl     REACTION: u/k   Current Outpatient Prescriptions on File Prior to Visit  Medication Sig Dispense Refill    . aspirin EC 81 MG EC tablet Take 1 tablet (81 mg total) by mouth daily.  30 tablet  3  . atorvastatin (LIPITOR) 40 MG tablet Take 40 mg by mouth daily.      . carvedilol (COREG) 12.5 MG tablet Take 12.5 mg by mouth 2 (two) times daily with a meal.       . Cholecalciferol (VITAMIN D-3) 1000 UNITS CAPS Take 1 capsule by mouth daily.      Marland Kitchen  fluticasone (FLONASE) 50 MCG/ACT nasal spray Place 2 sprays into both nostrils daily.       . furosemide (LASIX) 40 MG tablet Take 1 tablet (40 mg total) by mouth daily.  90 tablet  1  . glipiZIDE (GLUCOTROL XL) 10 MG 24 hr tablet Take 10 mg by mouth 2 (two) times daily.      Marland Kitchen levofloxacin (LEVAQUIN) 500 MG tablet Take 1 tablet (500 mg total) by mouth daily.  6 tablet  0  . levothyroxine (SYNTHROID, LEVOTHROID) 50 MCG tablet Take 50 mcg by mouth daily before breakfast.      . omeprazole (PRILOSEC) 20 MG capsule Take 20 mg by mouth daily.      Marland Kitchen OVER THE COUNTER MEDICATION Apply 1 application topically daily as needed (for stiffness in neck). "Muscle Reliever cream"      . potassium chloride 20 MEQ TBCR Take 20 mEq by mouth daily. Take when taking lasix.  30 tablet  0  . tolterodine (DETROL LA) 2 MG 24 hr capsule Take 1 capsule (2 mg total) by mouth daily.  30 capsule  5   No current facility-administered medications on file prior to visit.    Review of Systems    Review of Systems  Constitutional: Negative for fever, and unexpected weight change. pos for lethargy /malaise and loss of appetite that is improving this aftenoon  Eyes: Negative for pain and visual disturbance.  Respiratory: Negative for cough and shortness of breath.   Cardiovascular: Negative for cp or palpitations    Gastrointestinal: Negative for nausea, diarrhea and constipation.  Genitourinary: Negative for urgency and frequency.  Skin: Negative for pallor or rash   Neurological: Negative for, light-headedness, numbness and headaches. pos for generalized weakness  Hematological:  Negative for adenopathy. Does not bruise/bleed easily.  Psychiatric/Behavioral: Negative for dysphoric mood. The patient is not nervous/anxious.      Objective:   Physical Exam  Constitutional: She appears well-developed and well-nourished. No distress.  Frail appearing elderly female in walker -unable to ambulate w/o assistance   HENT:  Head: Normocephalic and atraumatic.  Mouth/Throat: Oropharynx is clear and moist.  Eyes: Conjunctivae and EOM are normal. Pupils are equal, round, and reactive to light. Right eye exhibits no discharge. Left eye exhibits no discharge. No scleral icterus.  Neck: Normal range of motion. Neck supple. No JVD present.  Cardiovascular: Normal rate, regular rhythm and intact distal pulses.   Murmur heard. Pulmonary/Chest: Effort normal and breath sounds normal. No respiratory distress. She has no wheezes. She has no rales. She exhibits no tenderness.  No crackles or rales  Good 02 sat   Abdominal: Soft. Bowel sounds are normal. She exhibits no distension and no mass. There is no tenderness. There is no rebound and no guarding.  Musculoskeletal: She exhibits tenderness. She exhibits no edema.  Tender CS musculature today- with mildly limited rom    Lymphadenopathy:    She has no cervical adenopathy.  Neurological: She is alert. She has normal reflexes. No cranial nerve deficit. She exhibits normal muscle tone. Coordination normal.  Pt seems somewhat sedated but answers questions appropriately  Skin: Skin is warm and dry. No rash noted. No erythema. No pallor.  Nl color and turgor  Psychiatric: Her affect is blunt.          Assessment & Plan:   Problem List Items Addressed This Visit     Cardiovascular and Mediastinum   HYPERTENSION - Primary     bp improved from hosp d/c  Will continue to follow  Lab today    Relevant Orders      Comprehensive metabolic panel   Chronic diastolic CHF (congestive heart failure)     Acute exacerbation in hosp  when tx for HCAP 80 lasix resolved swelling Now on 40  Will f/u with cardiol as planned and update if symptoms return      Respiratory   HCAP (healthcare-associated pneumonia)     Clinically improved - with no further sob or cough On levaquin Cbc today for elevated wbc Rev hosp records and studies in detail      Relevant Orders      CBC with Differential     Endocrine   DIABETES MELLITUS, TYPE II     Glucose high in hospital with infx  On glipizide alone now  Family is not checking glucose  She may have had 2 doses in one day - glucose check today Appetite improved today      Genitourinary   CKD (chronic kidney disease) stage 4, GFR 15-29 ml/min     Lab today ecoli in urine during hosp  On levaquin for this and HCAP  May have accidentally had 2 doses in one day Lab today    Relevant Orders      Comprehensive metabolic panel      POCT urinalysis dipstick (Completed)      Urine culture   UTI (urinary tract infection)      ecoli in urine in hospital On levaquin Has renal insuff  Lab today  ua fairly clear but with protein  Results for orders placed in visit on 09/23/13  POCT URINALYSIS DIPSTICK      Result Value Ref Range   Color, UA yellow     Clarity, UA hazy     Glucose, UA neg.     Bilirubin, UA neg.     Ketones, UA Small     Spec Grav, UA 1.010     Blood, UA Trace     pH, UA 6.5     Protein, UA 100++     Urobilinogen, UA 0.2     Nitrite, UA neg.     Leukocytes, UA Negative         Relevant Orders      POCT urinalysis dipstick (Completed)      Urine culture     Other   Medication administered in error     Per family at home she may have had meds given twice by a family member This would explain her lethargy/weakness and sedation  Rev poss probs caused by inc dose of each drug  Vitals nl today She is starting to improve this afternoon Lab today  Long disc re: admin of meds / they will no longer let current person do this as he may not be able  to do so accuracy Will take pt to ER if any worsening in condition after hours

## 2013-09-23 NOTE — Assessment & Plan Note (Signed)
Per family at home she may have had meds given twice by a family member This would explain her lethargy/weakness and sedation  Rev poss probs caused by inc dose of each drug  Vitals nl today She is starting to improve this afternoon Lab today  Long disc re: admin of meds / they will no longer let current person do this as he may not be able to do so accuracy Will take pt to ER if any worsening in condition after hours

## 2013-09-24 ENCOUNTER — Inpatient Hospital Stay (HOSPITAL_COMMUNITY): Payer: Medicare Other

## 2013-09-24 ENCOUNTER — Encounter (HOSPITAL_COMMUNITY): Payer: Self-pay | Admitting: Emergency Medicine

## 2013-09-24 ENCOUNTER — Emergency Department (HOSPITAL_COMMUNITY): Payer: Medicare Other

## 2013-09-24 ENCOUNTER — Inpatient Hospital Stay (HOSPITAL_COMMUNITY)
Admission: EM | Admit: 2013-09-24 | Discharge: 2013-09-29 | DRG: 552 | Disposition: A | Discharge status: Skilled Nursing Facility | Payer: Medicare Other | Attending: Family Medicine | Admitting: Family Medicine

## 2013-09-24 DIAGNOSIS — L259 Unspecified contact dermatitis, unspecified cause: Secondary | ICD-10-CM

## 2013-09-24 DIAGNOSIS — Z954 Presence of other heart-valve replacement: Secondary | ICD-10-CM

## 2013-09-24 DIAGNOSIS — N179 Acute kidney failure, unspecified: Secondary | ICD-10-CM | POA: Diagnosis present

## 2013-09-24 DIAGNOSIS — L538 Other specified erythematous conditions: Secondary | ICD-10-CM

## 2013-09-24 DIAGNOSIS — M949 Disorder of cartilage, unspecified: Secondary | ICD-10-CM

## 2013-09-24 DIAGNOSIS — Z7982 Long term (current) use of aspirin: Secondary | ICD-10-CM

## 2013-09-24 DIAGNOSIS — K219 Gastro-esophageal reflux disease without esophagitis: Secondary | ICD-10-CM

## 2013-09-24 DIAGNOSIS — M5126 Other intervertebral disc displacement, lumbar region: Secondary | ICD-10-CM | POA: Diagnosis present

## 2013-09-24 DIAGNOSIS — Z823 Family history of stroke: Secondary | ICD-10-CM | POA: Diagnosis not present

## 2013-09-24 DIAGNOSIS — H547 Unspecified visual loss: Secondary | ICD-10-CM | POA: Diagnosis present

## 2013-09-24 DIAGNOSIS — N184 Chronic kidney disease, stage 4 (severe): Secondary | ICD-10-CM | POA: Diagnosis present

## 2013-09-24 DIAGNOSIS — E039 Hypothyroidism, unspecified: Secondary | ICD-10-CM | POA: Diagnosis present

## 2013-09-24 DIAGNOSIS — D509 Iron deficiency anemia, unspecified: Secondary | ICD-10-CM | POA: Diagnosis present

## 2013-09-24 DIAGNOSIS — R5381 Other malaise: Secondary | ICD-10-CM

## 2013-09-24 DIAGNOSIS — Z950 Presence of cardiac pacemaker: Secondary | ICD-10-CM | POA: Diagnosis not present

## 2013-09-24 DIAGNOSIS — I509 Heart failure, unspecified: Secondary | ICD-10-CM | POA: Diagnosis present

## 2013-09-24 DIAGNOSIS — G609 Hereditary and idiopathic neuropathy, unspecified: Secondary | ICD-10-CM

## 2013-09-24 DIAGNOSIS — D72829 Elevated white blood cell count, unspecified: Secondary | ICD-10-CM | POA: Diagnosis present

## 2013-09-24 DIAGNOSIS — R339 Retention of urine, unspecified: Secondary | ICD-10-CM | POA: Diagnosis present

## 2013-09-24 DIAGNOSIS — M542 Cervicalgia: Secondary | ICD-10-CM

## 2013-09-24 DIAGNOSIS — R531 Weakness: Secondary | ICD-10-CM

## 2013-09-24 DIAGNOSIS — M545 Low back pain, unspecified: Secondary | ICD-10-CM | POA: Diagnosis present

## 2013-09-24 DIAGNOSIS — N3941 Urge incontinence: Secondary | ICD-10-CM

## 2013-09-24 DIAGNOSIS — R413 Other amnesia: Secondary | ICD-10-CM

## 2013-09-24 DIAGNOSIS — N289 Disorder of kidney and ureter, unspecified: Secondary | ICD-10-CM

## 2013-09-24 DIAGNOSIS — I447 Left bundle-branch block, unspecified: Secondary | ICD-10-CM | POA: Diagnosis present

## 2013-09-24 DIAGNOSIS — R262 Difficulty in walking, not elsewhere classified: Secondary | ICD-10-CM

## 2013-09-24 DIAGNOSIS — E860 Dehydration: Secondary | ICD-10-CM | POA: Diagnosis present

## 2013-09-24 DIAGNOSIS — I4891 Unspecified atrial fibrillation: Secondary | ICD-10-CM | POA: Diagnosis present

## 2013-09-24 DIAGNOSIS — E119 Type 2 diabetes mellitus without complications: Secondary | ICD-10-CM

## 2013-09-24 DIAGNOSIS — Z8709 Personal history of other diseases of the respiratory system: Secondary | ICD-10-CM

## 2013-09-24 DIAGNOSIS — M810 Age-related osteoporosis without current pathological fracture: Secondary | ICD-10-CM | POA: Diagnosis present

## 2013-09-24 DIAGNOSIS — J9620 Acute and chronic respiratory failure, unspecified whether with hypoxia or hypercapnia: Secondary | ICD-10-CM

## 2013-09-24 DIAGNOSIS — I455 Other specified heart block: Secondary | ICD-10-CM

## 2013-09-24 DIAGNOSIS — I5032 Chronic diastolic (congestive) heart failure: Secondary | ICD-10-CM | POA: Diagnosis present

## 2013-09-24 DIAGNOSIS — I10 Essential (primary) hypertension: Secondary | ICD-10-CM

## 2013-09-24 DIAGNOSIS — E559 Vitamin D deficiency, unspecified: Secondary | ICD-10-CM

## 2013-09-24 DIAGNOSIS — R5383 Other fatigue: Secondary | ICD-10-CM

## 2013-09-24 DIAGNOSIS — M549 Dorsalgia, unspecified: Secondary | ICD-10-CM | POA: Diagnosis present

## 2013-09-24 DIAGNOSIS — J45909 Unspecified asthma, uncomplicated: Secondary | ICD-10-CM

## 2013-09-24 DIAGNOSIS — I129 Hypertensive chronic kidney disease with stage 1 through stage 4 chronic kidney disease, or unspecified chronic kidney disease: Secondary | ICD-10-CM | POA: Diagnosis present

## 2013-09-24 DIAGNOSIS — E7800 Pure hypercholesterolemia, unspecified: Secondary | ICD-10-CM

## 2013-09-24 DIAGNOSIS — I442 Atrioventricular block, complete: Secondary | ICD-10-CM

## 2013-09-24 DIAGNOSIS — M519 Unspecified thoracic, thoracolumbar and lumbosacral intervertebral disc disorder: Secondary | ICD-10-CM

## 2013-09-24 DIAGNOSIS — E785 Hyperlipidemia, unspecified: Secondary | ICD-10-CM | POA: Diagnosis present

## 2013-09-24 DIAGNOSIS — Z951 Presence of aortocoronary bypass graft: Secondary | ICD-10-CM | POA: Diagnosis not present

## 2013-09-24 DIAGNOSIS — Z9861 Coronary angioplasty status: Secondary | ICD-10-CM

## 2013-09-24 DIAGNOSIS — N904 Leukoplakia of vulva: Secondary | ICD-10-CM

## 2013-09-24 DIAGNOSIS — J189 Pneumonia, unspecified organism: Secondary | ICD-10-CM

## 2013-09-24 DIAGNOSIS — I2510 Atherosclerotic heart disease of native coronary artery without angina pectoris: Secondary | ICD-10-CM | POA: Diagnosis present

## 2013-09-24 DIAGNOSIS — Z952 Presence of prosthetic heart valve: Secondary | ICD-10-CM

## 2013-09-24 DIAGNOSIS — M899 Disorder of bone, unspecified: Secondary | ICD-10-CM | POA: Diagnosis present

## 2013-09-24 DIAGNOSIS — C4492 Squamous cell carcinoma of skin, unspecified: Secondary | ICD-10-CM

## 2013-09-24 DIAGNOSIS — I454 Nonspecific intraventricular block: Secondary | ICD-10-CM

## 2013-09-24 DIAGNOSIS — M4640 Discitis, unspecified, site unspecified: Secondary | ICD-10-CM

## 2013-09-24 DIAGNOSIS — I252 Old myocardial infarction: Secondary | ICD-10-CM

## 2013-09-24 DIAGNOSIS — R001 Bradycardia, unspecified: Secondary | ICD-10-CM

## 2013-09-24 DIAGNOSIS — R350 Frequency of micturition: Secondary | ICD-10-CM

## 2013-09-24 LAB — SEDIMENTATION RATE: Sed Rate: 89 mm/hr — ABNORMAL HIGH (ref 0–22)

## 2013-09-24 LAB — CBC WITH DIFFERENTIAL/PLATELET
BASOS PCT: 0.2 % (ref 0.0–3.0)
BASOS PCT: 0 % (ref 0–1)
Basophils Absolute: 0 10*3/uL (ref 0.0–0.1)
Basophils Absolute: 0 10*3/uL (ref 0.0–0.1)
Eosinophils Absolute: 0 10*3/uL (ref 0.0–0.7)
Eosinophils Absolute: 0 10*3/uL (ref 0.0–0.7)
Eosinophils Relative: 0 % (ref 0.0–5.0)
Eosinophils Relative: 0 % (ref 0–5)
HCT: 37.7 % (ref 36.0–46.0)
HCT: 36 % (ref 36.0–46.0)
Hemoglobin: 12.4 g/dL (ref 12.0–15.0)
Hemoglobin: 12.1 g/dL (ref 12.0–15.0)
LYMPHS PCT: 7.5 % — AB (ref 12.0–46.0)
Lymphocytes Relative: 8 % — ABNORMAL LOW (ref 12–46)
Lymphs Abs: 1.4 10*3/uL (ref 0.7–4.0)
Lymphs Abs: 1.4 10*3/uL (ref 0.7–4.0)
MCH: 30.2 pg (ref 26.0–34.0)
MCHC: 33 g/dL (ref 30.0–36.0)
MCHC: 33.6 g/dL (ref 30.0–36.0)
MCV: 92 fl (ref 78.0–100.0)
MCV: 89.8 fL (ref 78.0–100.0)
MONOS PCT: 9.6 % (ref 3.0–12.0)
MONOS PCT: 14 % — AB (ref 3–12)
Monocytes Absolute: 1.8 10*3/uL — ABNORMAL HIGH (ref 0.1–1.0)
Monocytes Absolute: 2.4 10*3/uL — ABNORMAL HIGH (ref 0.1–1.0)
NEUTROS ABS: 13.4 10*3/uL — AB (ref 1.7–7.7)
NEUTROS PCT: 78 % — AB (ref 43–77)
Neutro Abs: 15.2 10*3/uL — ABNORMAL HIGH (ref 1.4–7.7)
Neutrophils Relative %: 82.7 % — ABNORMAL HIGH (ref 43.0–77.0)
PLATELETS: 174 10*3/uL (ref 150–400)
Platelets: 176 10*3/uL (ref 150.0–400.0)
RBC: 4.09 Mil/uL (ref 3.87–5.11)
RBC: 4.01 MIL/uL (ref 3.87–5.11)
RDW: 18.3 % — ABNORMAL HIGH (ref 11.5–15.5)
RDW: 16.1 % — ABNORMAL HIGH (ref 11.5–15.5)
WBC: 18.5 10*3/uL (ref 4.0–10.5)
WBC: 17.1 10*3/uL — ABNORMAL HIGH (ref 4.0–10.5)

## 2013-09-24 LAB — RAPID URINE DRUG SCREEN, HOSP PERFORMED
Amphetamines: NOT DETECTED
BARBITURATES: NOT DETECTED
Benzodiazepines: NOT DETECTED
Cocaine: NOT DETECTED
OPIATES: NOT DETECTED
TETRAHYDROCANNABINOL: NOT DETECTED

## 2013-09-24 LAB — COMPREHENSIVE METABOLIC PANEL
ALK PHOS: 84 U/L (ref 39–117)
ALT: 12 U/L (ref 0–35)
AST: 23 U/L (ref 0–37)
Albumin: 3.6 g/dL (ref 3.5–5.2)
BUN: 48 mg/dL — ABNORMAL HIGH (ref 6–23)
CO2: 28 mEq/L (ref 19–32)
Calcium: 8.3 mg/dL — ABNORMAL LOW (ref 8.4–10.5)
Chloride: 94 mEq/L — ABNORMAL LOW (ref 96–112)
Creatinine, Ser: 2.4 mg/dL — ABNORMAL HIGH (ref 0.4–1.2)
GFR: 21.29 mL/min — ABNORMAL LOW (ref >60.00)
GLUCOSE: 152 mg/dL — AB (ref 70–99)
POTASSIUM: 4.6 meq/L (ref 3.5–5.1)
Sodium: 136 mEq/L (ref 135–145)
Total Bilirubin: 0.9 mg/dL (ref 0.2–1.2)
Total Protein: 8.2 g/dL (ref 6.0–8.3)

## 2013-09-24 LAB — I-STAT TROPONIN, ED: TROPONIN I, POC: 0.01 ng/mL (ref 0.00–0.08)

## 2013-09-24 LAB — I-STAT CG4 LACTIC ACID, ED: Lactic Acid, Venous: 1.38 mmol/L (ref 0.5–2.2)

## 2013-09-24 LAB — URINALYSIS, ROUTINE W REFLEX MICROSCOPIC
BILIRUBIN URINE: NEGATIVE
BILIRUBIN URINE: NEGATIVE
Glucose, UA: NEGATIVE mg/dL
Glucose, UA: NEGATIVE mg/dL
HGB URINE DIPSTICK: NEGATIVE
Hgb urine dipstick: NEGATIVE
Ketones, ur: 15 mg/dL — AB
Ketones, ur: 15 mg/dL — AB
Leukocytes, UA: NEGATIVE
Leukocytes, UA: NEGATIVE
Nitrite: NEGATIVE
Nitrite: NEGATIVE
PH: 6.5 (ref 5.0–8.0)
Protein, ur: 100 mg/dL — AB
Protein, ur: 100 mg/dL — AB
SPECIFIC GRAVITY, URINE: 1.013 (ref 1.005–1.030)
SPECIFIC GRAVITY, URINE: 1.012 (ref 1.005–1.030)
UROBILINOGEN UA: 1 mg/dL (ref 0.0–1.0)
Urobilinogen, UA: 1 mg/dL (ref 0.0–1.0)
pH: 6 (ref 5.0–8.0)

## 2013-09-24 LAB — CREATININE, URINE, RANDOM: CREATININE, URINE: 78.46 mg/dL

## 2013-09-24 LAB — GLUCOSE, CAPILLARY
Glucose-Capillary: 152 mg/dL — ABNORMAL HIGH (ref 70–99)
Glucose-Capillary: 124 mg/dL — ABNORMAL HIGH (ref 70–99)
Glucose-Capillary: 61 mg/dL — ABNORMAL LOW (ref 70–99)
Glucose-Capillary: 80 mg/dL (ref 70–99)

## 2013-09-24 LAB — I-STAT CHEM 8, ED
BUN: 41 mg/dL — ABNORMAL HIGH (ref 6–23)
CHLORIDE: 95 meq/L — AB (ref 96–112)
Calcium, Ion: 0.9 mmol/L — ABNORMAL LOW (ref 1.13–1.30)
Creatinine, Ser: 2.2 mg/dL — ABNORMAL HIGH (ref 0.50–1.10)
GLUCOSE: 82 mg/dL (ref 70–99)
HCT: 41 % (ref 36.0–46.0)
HEMOGLOBIN: 13.9 g/dL (ref 12.0–15.0)
POTASSIUM: 3.2 meq/L — AB (ref 3.7–5.3)
SODIUM: 136 meq/L — AB (ref 137–147)
TCO2: 26 mmol/L (ref 0–100)

## 2013-09-24 LAB — URINE MICROSCOPIC-ADD ON

## 2013-09-24 LAB — TSH: TSH: 2.36 u[IU]/mL (ref 0.350–4.500)

## 2013-09-24 LAB — OSMOLALITY, URINE: OSMOLALITY UR: 381 mosm/kg — AB (ref 390–1090)

## 2013-09-24 LAB — HEMOGLOBIN A1C
Hgb A1c MFr Bld: 7.1 % — ABNORMAL HIGH (ref <5.7)
MEAN PLASMA GLUCOSE: 157 mg/dL — AB (ref <117)

## 2013-09-24 LAB — LIPASE, BLOOD: Lipase: 18 U/L (ref 11–59)

## 2013-09-24 LAB — OSMOLALITY: Osmolality: 294 mOsm/kg (ref 275–300)

## 2013-09-24 LAB — SODIUM, URINE, RANDOM: Sodium, Ur: <20 mEq/L

## 2013-09-24 LAB — LACTIC ACID, PLASMA: Lactic Acid, Venous: 1.2 mmol/L (ref 0.5–2.2)

## 2013-09-24 LAB — CBG MONITORING, ED: GLUCOSE-CAPILLARY: 82 mg/dL (ref 70–99)

## 2013-09-24 MED ORDER — FENTANYL CITRATE 0.05 MG/ML IJ SOLN
50.0000 ug | Freq: Once | INTRAMUSCULAR | Status: AC
  Administered 2013-09-24: 50 ug via INTRAVENOUS
  Filled 2013-09-24: qty 2

## 2013-09-24 MED ORDER — LEVOTHYROXINE SODIUM 50 MCG PO TABS
50.0000 ug | ORAL_TABLET | Freq: Every day | ORAL | Status: DC
Start: 2013-09-24 — End: 2013-09-29
  Administered 2013-09-24 – 2013-09-29 (×6): 50 ug via ORAL
  Filled 2013-09-24 (×8): qty 1

## 2013-09-24 MED ORDER — POLYETHYLENE GLYCOL 3350 17 G PO PACK
17.0000 g | PACK | Freq: Every day | ORAL | Status: DC | PRN
  Administered 2013-09-27: 17 g via ORAL
  Filled 2013-09-24: qty 1

## 2013-09-24 MED ORDER — ATORVASTATIN CALCIUM 40 MG PO TABS
40.0000 mg | ORAL_TABLET | Freq: Every day | ORAL | Status: DC
  Administered 2013-09-24 – 2013-09-29 (×6): 40 mg via ORAL
  Filled 2013-09-24 (×6): qty 1

## 2013-09-24 MED ORDER — FENTANYL CITRATE 0.05 MG/ML IJ SOLN: 100.0000 ug | Freq: Once | INTRAMUSCULAR | Status: DC

## 2013-09-24 MED ORDER — SODIUM CHLORIDE 0.9 % IJ SOLN
3.0000 mL | Freq: Two times a day (BID) | INTRAMUSCULAR | Status: DC
  Administered 2013-09-24 – 2013-09-26 (×3): 3 mL via INTRAVENOUS

## 2013-09-24 MED ORDER — PIPERACILLIN-TAZOBACTAM 3.375 G IVPB 30 MIN
3.3750 g | Freq: Once | INTRAVENOUS | Status: AC
  Administered 2013-09-24: 3.375 g via INTRAVENOUS
  Filled 2013-09-24: qty 50

## 2013-09-24 MED ORDER — ASPIRIN EC 81 MG PO TBEC
81.0000 mg | DELAYED_RELEASE_TABLET | Freq: Every day | ORAL | Status: DC
  Administered 2013-09-24 – 2013-09-29 (×6): 81 mg via ORAL
  Filled 2013-09-24 (×6): qty 1

## 2013-09-24 MED ORDER — SODIUM CHLORIDE 0.9 % IV SOLN
INTRAVENOUS | Status: AC
  Administered 2013-09-24 – 2013-09-25 (×2): via INTRAVENOUS

## 2013-09-24 MED ORDER — GUAIFENESIN-DM 100-10 MG/5ML PO SYRP
5.0000 mL | ORAL_SOLUTION | ORAL | Status: DC | PRN
  Filled 2013-09-24: qty 5

## 2013-09-24 MED ORDER — NALOXONE HCL 0.4 MG/ML IJ SOLN
0.4000 mg | Freq: Once | INTRAMUSCULAR | Status: AC
Start: 2013-09-24 — End: 2013-09-24
  Administered 2013-09-24: 0.4 mg via INTRAVENOUS
  Filled 2013-09-24: qty 1

## 2013-09-24 MED ORDER — CARVEDILOL 12.5 MG PO TABS
12.5000 mg | ORAL_TABLET | Freq: Two times a day (BID) | ORAL | Status: DC
  Filled 2013-09-24 (×2): qty 1

## 2013-09-24 MED ORDER — IOHEXOL 300 MG/ML  SOLN: 10.0000 mL | Freq: Once | INTRAMUSCULAR | Status: AC | PRN

## 2013-09-24 MED ORDER — ONDANSETRON HCL 4 MG PO TABS
4.0000 mg | ORAL_TABLET | Freq: Four times a day (QID) | ORAL | Status: DC | PRN
  Administered 2013-09-25: 4 mg via ORAL
  Filled 2013-09-24: qty 1

## 2013-09-24 MED ORDER — SODIUM CHLORIDE 0.9 % IV SOLN
500.0000 mg | Freq: Two times a day (BID) | INTRAVENOUS | Status: DC
  Administered 2013-09-24 – 2013-09-26 (×5): 500 mg via INTRAVENOUS
  Filled 2013-09-24 (×8): qty 0.5

## 2013-09-24 MED ORDER — PANTOPRAZOLE SODIUM 40 MG PO TBEC
40.0000 mg | DELAYED_RELEASE_TABLET | Freq: Every day | ORAL | Status: DC
  Administered 2013-09-24 – 2013-09-29 (×6): 40 mg via ORAL
  Filled 2013-09-24 (×6): qty 1

## 2013-09-24 MED ORDER — SODIUM CHLORIDE 0.9 % IV SOLN
INTRAVENOUS | Status: DC
  Administered 2013-09-24: 100 mL/h via INTRAVENOUS

## 2013-09-24 MED ORDER — HYDROCODONE-ACETAMINOPHEN 5-325 MG PO TABS
1.0000 | ORAL_TABLET | ORAL | Status: DC | PRN
  Administered 2013-09-25 – 2013-09-29 (×11): 1 via ORAL
  Filled 2013-09-24 (×12): qty 1

## 2013-09-24 MED ORDER — INSULIN ASPART 100 UNIT/ML ~~LOC~~ SOLN
0.0000 [IU] | Freq: Three times a day (TID) | SUBCUTANEOUS | Status: DC
  Administered 2013-09-24: 2 [IU] via SUBCUTANEOUS
  Administered 2013-09-24: 1 [IU] via SUBCUTANEOUS
  Administered 2013-09-25 – 2013-09-27 (×3): 2 [IU] via SUBCUTANEOUS
  Administered 2013-09-27 – 2013-09-28 (×2): 1 [IU] via SUBCUTANEOUS
  Administered 2013-09-28 – 2013-09-29 (×3): 2 [IU] via SUBCUTANEOUS
  Administered 2013-09-29: 1 [IU] via SUBCUTANEOUS

## 2013-09-24 MED ORDER — CARVEDILOL 6.25 MG PO TABS
6.2500 mg | ORAL_TABLET | Freq: Two times a day (BID) | ORAL | Status: DC
  Administered 2013-09-24 – 2013-09-29 (×11): 6.25 mg via ORAL
  Filled 2013-09-24 (×13): qty 1

## 2013-09-24 MED ORDER — ONDANSETRON HCL 4 MG/2ML IJ SOLN: 4.0000 mg | Freq: Four times a day (QID) | INTRAMUSCULAR | Status: DC | PRN

## 2013-09-24 MED ORDER — VANCOMYCIN HCL IN DEXTROSE 1-5 GM/200ML-% IV SOLN
1000.0000 mg | Freq: Once | INTRAVENOUS | Status: AC
  Administered 2013-09-24: 1000 mg via INTRAVENOUS
  Filled 2013-09-24: qty 200

## 2013-09-24 MED ORDER — POTASSIUM CHLORIDE 10 MEQ/100ML IV SOLN
10.0000 meq | INTRAVENOUS | Status: AC
  Administered 2013-09-24: 10 meq via INTRAVENOUS
  Filled 2013-09-24: qty 100

## 2013-09-24 MED ORDER — POTASSIUM CHLORIDE CRYS ER 20 MEQ PO TBCR
40.0000 meq | EXTENDED_RELEASE_TABLET | Freq: Once | ORAL | Status: AC
  Administered 2013-09-24: 40 meq via ORAL
  Filled 2013-09-24: qty 2

## 2013-09-24 NOTE — ED Notes (Signed)
Per radiology Randal Buba, MD is aware that Aram Beecham, MD is to speak with radiology upon admission to have DG Myelogram Cervical & DG Myelogram Thoracic completed

## 2013-09-24 NOTE — ED Notes (Signed)
Patient having trouble walking tonight, feeling weak with a headache.  She denies any CP or shortness of breath.  Patient's family members state that she has had to be carried to the car to come to ED.

## 2013-09-24 NOTE — ED Notes (Signed)
MD at bedside. 

## 2013-09-24 NOTE — Progress Notes (Signed)
UR Completed Arianne Klinge Graves-Bigelow, RN,BSN [phone]  

## 2013-09-24 NOTE — ED Provider Notes (Addendum)
CSN: [id number]     Arrival date & time 09/24/13  [id number] History   First MD Initiated Contact with Patient 09/24/13 [phone]     Chief Complaint  Patient presents with  . Fatigue  . Headache     (Consider location/radiation/quality/duration/timing/severity/associated sxs/prior Treatment) Patient is a 79 y.o. female presenting with back pain. The history is provided by a relative. The history is limited by the condition of the patient.  Back Pain Location:  Lumbar spine Quality:  Stabbing Radiates to:  Does not radiate Pain severity:  Severe Pain is:  Same all the time Onset quality:  Gradual Duration:  1 day Timing:  Constant Progression:  Unchanged Chronicity:  New Context: not MCA, not MVA and not physical stress   Relieved by:  Nothing Worsened by:  Nothing tried Ineffective treatments:  None tried Associated symptoms: headaches and weakness   Associated symptoms: no abdominal pain, no bladder incontinence, no chest pain, no dysuria, no fever, no numbness, no tingling and no weight loss   Risk factors: no hx of cancer   Seen by Dr. Glori Bickers for AMS.  Thought too many meds given.  Patient was able to walk previous day now cant.  No photophobia  Past Medical History  Diagnosis Date  . Iron deficiency anemia, unspecified   . Chronic diastolic CHF (congestive heart failure)   . CAD  S/P CABG x 3 08/2011     LIMA to diagonal branch, SVG to OM1, SVG to PDA, EVH via bilateral thighs  . Type II or unspecified type diabetes mellitus without mention of complication, not stated as uncontrolled   . Contact dermatitis and other eczema, due to unspecified cause   . Esophageal reflux   . Headache(784.0)   . Pacemaker MDT dual chamber     DOI 08/2011  . Pure hypercholesterolemia   . hypertension   . Hypothyroidism   . Atrioventricular block, complete -intermittent   . Memory loss   . Idiopathic peripheral neuropathy   . LBBB (left bundle branch block)   . Carotid stenosis     Carotid  dopplers 0000000 with [id number] LICA stenosis  . Severe aortic stenosis -S/P AVR      19 mm [facility] Ease pericardial tissue valve  . Extrinsic asthma, unspecified     hasn't used inhaler in past year  . CKD (chronic kidney disease) stage 4, GFR 15-29 ml/min     Cr 1.6 in 6/11, sees Dr. Arty Baumgartner  . Impaired vision     pt. reports that she identifies her meds by looking at the pillls, she is not able to read labels on bottles   Past Surgical History  Procedure Laterality Date  . Ptca  [phone]    5 blockages  . Cardiac catheterization    . Esophageal dilation  1997  . Colonoscopy  04/1999    1 polyp  . Esophagogastroduodenoscopy  04/1999    HH; "watermelon stomach" (severe gastritis)  . Dexa  10/2003 ,09/2005    osteoporosis,  Osteopenia  . Cataract extraction, bilateral  2003  . Esophagogastroduodenoscopy  04/2002    Gastritis  . Cardiovascular stress test  10/2002    normal (per patient)  . Abdominal hysterectomy  1977    partial; fibroids  . Bladder tack  1977  . Colonoscopy  06/2004    Neg. Int hem  . Carotid dopplers  2006  . Refractive surgery  2006  . Opthy  11/1998;12/01;11/02  . Aortic valve replacement  09/06/2011  Procedure: AORTIC VALVE REPLACEMENT (AVR);  Surgeon: Rexene Alberts, MD;  Location: [facility];  Service: Open Heart Surgery;  Laterality: N/A;  . Coronary artery bypass graft  09/06/2011    Procedure: CORONARY ARTERY BYPASS GRAFTING (CABG);  Surgeon: Rexene Alberts, MD;  Location: [facility];  Service: Open Heart Surgery;  Laterality: N/A;   Family History  Problem Relation Age of Onset  . Stroke Father     died in his 35's  . Stroke Mother     died in her 51's   History  Substance Use Topics  . Smoking status: Never Smoker   . Smokeless tobacco: Never Used  . Alcohol Use: No   OB History   Grav Para Term Preterm Abortions TAB SAB Ect Mult Living                 Review of Systems  Constitutional: Negative for fever and weight loss.  Respiratory:  Negative for cough and shortness of breath.   Cardiovascular: Negative for chest pain.  Gastrointestinal: Negative for nausea, abdominal pain, diarrhea, constipation, blood in stool and rectal pain.  Genitourinary: Negative for bladder incontinence and dysuria.  Musculoskeletal: Positive for back pain and gait problem.  Neurological: Positive for weakness and headaches. Negative for tingling and numbness.  All other systems reviewed and are negative.     Allergies  Metformin and Promethazine hcl  Home Medications   Prior to Admission medications   Medication Sig Start Date End Date Taking? Authorizing Provider  aspirin EC 81 MG EC tablet Take 1 tablet (81 mg total) by mouth daily. 08/12/13  Yes Ripudeep Krystal Eaton, MD  atorvastatin (LIPITOR) 40 MG tablet Take 40 mg by mouth daily.   Yes Historical Provider, MD  carvedilol (COREG) 12.5 MG tablet Take 12.5 mg by mouth 2 (two) times daily with a meal.    Yes Historical Provider, MD  Cholecalciferol (VITAMIN D-3) 1000 UNITS CAPS Take 1 capsule by mouth daily.   Yes Historical Provider, MD  fluticasone (FLONASE) 50 MCG/ACT nasal spray Place 2 sprays into both nostrils daily.    Yes Historical Provider, MD  furosemide (LASIX) 40 MG tablet Take 1 tablet (40 mg total) by mouth daily. 09/18/13  Yes Larey Dresser, MD  glipiZIDE (GLUCOTROL XL) 10 MG 24 hr tablet Take 10 mg by mouth 2 (two) times daily.   Yes Historical Provider, MD  levothyroxine (SYNTHROID, LEVOTHROID) 50 MCG tablet Take 50 mcg by mouth daily before breakfast.   Yes Historical Provider, MD  omeprazole (PRILOSEC) 20 MG capsule Take 20 mg by mouth daily.   Yes Historical Provider, MD  OVER THE COUNTER MEDICATION Apply 1 application topically daily as needed (for stiffness in neck). "Muscle Reliever cream"   Yes Historical Provider, MD  potassium chloride 20 MEQ TBCR Take 20 mEq by mouth daily. Take when taking lasix. 09/18/13  Yes Marianne L York, PA-C   BP 145/62  Pulse 85  Temp(Src)  99.1 F (37.3 C) (Oral)  Resp 19  SpO2 98% Physical Exam  Constitutional: She appears well-developed and well-nourished. No distress.  HENT:  Head: Normocephalic and atraumatic.  Mouth/Throat: Oropharynx is clear and moist.  Eyes: Conjunctivae are normal. Pupils are equal, round, and reactive to light.  Neck: Normal range of motion. Neck supple.  No meningismus  Cardiovascular: Normal rate, regular rhythm and intact distal pulses.   Pulmonary/Chest: Effort normal and breath sounds normal. No respiratory distress. She has no wheezes. She has no rales.  Abdominal:  Soft. Bowel sounds are normal. There is no tenderness. There is no rebound and no guarding.  Genitourinary:  Decreased rectal tone  Musculoskeletal: She exhibits no edema and no tenderness.  Lymphadenopathy:    She has no cervical adenopathy.  Neurological: She is alert. No cranial nerve deficit.  5-5 BLE  Skin: Skin is warm and dry.  Psychiatric: She has a normal mood and affect.    ED Course  Procedures (including critical care time) Labs Review Labs Reviewed  CBC WITH DIFFERENTIAL - Abnormal; Notable for the following:    WBC 17.1 (*)    RDW 16.1 (*)    Neutrophils Relative % 78 (*)    Neutro Abs 13.4 (*)    Lymphocytes Relative 8 (*)    Monocytes Relative 14 (*)    Monocytes Absolute 2.4 (*)    All other components within normal limits  URINALYSIS, ROUTINE W REFLEX MICROSCOPIC - Abnormal; Notable for the following:    Ketones, ur 15 (*)    Protein, ur 100 (*)    All other components within normal limits  SEDIMENTATION RATE - Abnormal; Notable for the following:    Sed Rate 89 (*)    All other components within normal limits  URINE MICROSCOPIC-ADD ON - Abnormal; Notable for the following:    Squamous Epithelial / LPF FEW (*)    Casts HYALINE CASTS (*)    All other components within normal limits  I-STAT CHEM 8, ED - Abnormal; Notable for the following:    Sodium 136 (*)    Potassium 3.2 (*)    Chloride  95 (*)    BUN 41 (*)    Creatinine, Ser 2.20 (*)    Calcium, Ion 0.90 (*)    All other components within normal limits  CULTURE, BLOOD (ROUTINE X 2)  CULTURE, BLOOD (ROUTINE X 2)  LACTIC ACID, PLASMA  URINALYSIS, ROUTINE W REFLEX MICROSCOPIC  I-STAT CHEM 8, ED  I-STAT TROPOININ, ED  I-STAT CG4 LACTIC ACID, ED  CBG MONITORING, ED    Imaging Review Ct Head Wo Contrast  09/24/2013   CLINICAL DATA:  Trouble walking.  Fatigue.  Headache.  EXAM: CT HEAD WITHOUT CONTRAST  TECHNIQUE: Contiguous axial images were obtained from the base of the skull through the vertex without intravenous contrast.  COMPARISON:  None.  FINDINGS: Diffuse prominence of the CSF containing spaces is compatible with generalized cerebral atrophy. Scattered and confluent hypodensity within the periventricular and deep white matter both cerebral hemispheres is most consistent with moderate chronic microvascular ischemic disease. Prominent vascular calcifications present within the carotid siphons and distal vertebral arteries.  There is no acute intracranial hemorrhage or infarct. No mass lesion or midline shift. Gray-white matter differentiation is well maintained. Ventricles are normal in size without evidence of hydrocephalus. CSF containing spaces are within normal limits. No extra-axial fluid collection.  The calvarium is intact.  Orbital soft tissues are within normal limits.  There is partial opacification of the left sphenoid sinus, which appears chronic in nature. Paranasal sinuses are otherwise clear. No mastoid effusion. Paranasal sinuses and mastoid air cells are well pneumatized and free of fluid.  Scalp soft tissues are unremarkable.  IMPRESSION: 1. No acute intracranial process. 2. Atrophy with moderate chronic microvascular ischemic disease. 3. Chronic left sphenoid sinus disease.   Electronically Signed   By: Jeannine Boga M.D.   On: 09/24/2013 05:42     EKG Interpretation   Date/Time:  Wednesday September 24 2013 05:00:00 EDT Ventricular Rate:  85 PR Interval:  229 QRS Duration: 158 QT Interval:  475 QTC Calculation: 565 R Axis:   -79 Text Interpretation:  Atrial-sensed ventricular-paced rhythm No further  analysis attempted due to paced rhythm Confirmed by [facility]  MD,  Lane Kjos ([id number]) on 09/24/2013 5:09:34 AM      MDM   Final diagnoses:  None  DDX: 1. Epidural abscess 2. Epidural hematoma 3. diskiitis 4. SIRS 5. Caudal equina secondary to mass or fracture.  MDM Number of Diagnoses or Management Options Leukocytosis:  Renal insufficiency:  Weakness:  Critical Care Total time providing critical care: 30-74 minutes MDM Reviewed: previous chart, nursing note and vitals Reviewed previous: labs Interpretation: labs, x-ray and CT scan (elevated WBC acute renal insufficiency) Total time providing critical care: 30-74 minutes. This excludes time spent performing separately reportable procedures and services. Consults: pulmonary, admitting MD and neurology (PCCM declined admit, seen by neuro)   Medications  vancomycin (VANCOCIN) IVPB 1000 mg/200 mL premix (not administered)  piperacillin-tazobactam (ZOSYN) IVPB 3.375 g (not administered)  potassium chloride 10 mEq in 100 mL IVPB (10 mEq Intravenous New Bag/Given 09/24/13 0655)  naloxone [facility]) injection 0.4 mg (0.4 mg Intravenous Given 09/24/13 0540)  fentaNYL (SUBLIMAZE) injection 50 mcg (50 mcg Intravenous Given 09/24/13 0655)   Cultures ordered.  Antibiotics ordered  Case d/w PCCM admit to medicine  See neurology note.  Do not tap d/t risk of tapping through abscess  Will need r/o epidural abscess.  Can't get MRI d/t pacer can't get myelogram at this time needs hydration.  Attempted call to [facility] to info them of need to improve renal function prior to myelogram, no call back  CRITICAL CARE Performed by: Carlisle Beers Total critical care time: 90 minutes Critical care time was exclusive of separately billable  procedures and treating other patients. Critical care was necessary to treat or prevent imminent or life-threatening deterioration. Critical care was time spent personally by me on the following activities: development of treatment plan with patient and/or surrogate as well as nursing, discussions with consultants, evaluation of patient's response to treatment, examination of patient, obtaining history from patient or surrogate, ordering and performing treatments and interventions, ordering and review of laboratory studies, ordering and review of radiographic studies, pulse oximetry and re-evaluation of patient's condition.    Carlisle Beers, MD 09/24/13 1055

## 2013-09-24 NOTE — ED Notes (Signed)
IV team notified for 2nd IV start

## 2013-09-24 NOTE — H&P (Addendum)
Patient Demographics  Barbara Garner, is a 79 y.o. female  MRN: [id number]   DOB - 1934-07-30  Admit Date - 09/24/2013  Outpatient Primary MD for the patient is Loura Pardon, MD   With History of -  Past Medical History  Diagnosis Date  . Iron deficiency anemia, unspecified   . Chronic diastolic CHF (congestive heart failure)   . CAD  S/P CABG x 3 08/2011     LIMA to diagonal branch, SVG to OM1, SVG to PDA, EVH via bilateral thighs  . Type II or unspecified type diabetes mellitus without mention of complication, not stated as uncontrolled   . Contact dermatitis and other eczema, due to unspecified cause   . Esophageal reflux   . Headache(784.0)   . Pacemaker MDT dual chamber     DOI 08/2011  . Pure hypercholesterolemia   . hypertension   . Hypothyroidism   . Atrioventricular block, complete -intermittent   . Memory loss   . Idiopathic peripheral neuropathy   . LBBB (left bundle branch block)   . Carotid stenosis     Carotid dopplers 0000000 with [id number] LICA stenosis  . Severe aortic stenosis -S/P AVR      19 mm [facility] Ease pericardial tissue valve  . Extrinsic asthma, unspecified     hasn't used inhaler in past year  . CKD (chronic kidney disease) stage 4, GFR 15-29 ml/min     Cr 1.6 in 6/11, sees Dr. Arty Baumgartner  . Impaired vision     pt. reports that she identifies her meds by looking at the pillls, she is not able to read labels on bottles      Past Surgical History  Procedure Laterality Date  . Ptca  [phone]    5 blockages  . Cardiac catheterization    . Esophageal dilation  1997  . Colonoscopy  04/1999    1 polyp  . Esophagogastroduodenoscopy  04/1999    HH; "watermelon stomach" (severe gastritis)  . Dexa  10/2003 ,09/2005    osteoporosis,  Osteopenia  . Cataract extraction, bilateral  2003  .  Esophagogastroduodenoscopy  04/2002    Gastritis  . Cardiovascular stress test  10/2002    normal (per patient)  . Abdominal hysterectomy  1977    partial; fibroids  . Bladder tack  1977  . Colonoscopy  06/2004    Neg. Int hem  . Carotid dopplers  2006  . Refractive surgery  2006  . Opthy  11/1998;12/01;11/02  . Aortic valve replacement  09/06/2011    Procedure: AORTIC VALVE REPLACEMENT (AVR);  Surgeon: Rexene Alberts, MD;  Location: [place];  Service: Open Heart Surgery;  Laterality: N/A;  . Coronary artery bypass graft  09/06/2011    Procedure: CORONARY ARTERY BYPASS GRAFTING (CABG);  Surgeon: Rexene Alberts, MD;  Location: [place];  Service: Open Heart Surgery;  Laterality: N/A;    in for   Chief Complaint  Patient presents with  .  Fatigue  . Headache     HPI  Barbara Garner  is a 79 y.o. female,   with history of CAD status post CABG, Chronic diastolic dysfunction EF in 2013 was 60%, Complete heart block status post pacemaker placement, status post aortic valve replacement (bovine), LBBB, HTN, Hypothyroidism, Dyslipidemia, CK D4 baseline creatinine around 1.5, iron deficiency anemia, type 2 diabetes mellitus, GERD, recent hospitalization for pneumonia, who is brought in by her son for generalized weakness, poor oral intake , accidental medication OD yesterday AM, last night started complaining of ?? Back pain, was brought to the ER by son, workup inconclusive Neuro was consulted who requested Myelogram and Hospitalist admission, patient is  somnolent, unreliable historian at this time, history through her 2 sons.     Review of Systems    Is somnolent, unreliable historian at this time, denies any headache, she says yes to back pain but cannot pinpoint where, she at times complains of neck pain but then says lower back hurts as well, one of the sons in the room says her mid back was hurting yesterday, per son Barbara Luster who also is her power of attorney patient has been gradually getting weak  over the last few weeks, not eating and drinking well, and having difficulties ambulating for the last few weeks, however denies weakness in any one extremity or the other. Has not noticed any fever or chills. Says she started complaining of back pain yesterday but cannot reliably tell where. No bowel bladder incontinence, no chest or abdominal pain. No diarrhea or dysuria.   Social History History  Substance Use Topics  . Smoking status: Never Smoker   . Smokeless tobacco: Never Used  . Alcohol Use: No      Family History Family History  Problem Relation Age of Onset  . Stroke Father     died in his 33's  . Stroke Mother     died in her 53's      Prior to Admission medications   Medication Sig Start Date End Date Taking? Authorizing Provider  aspirin EC 81 MG EC tablet Take 1 tablet (81 mg total) by mouth daily. 08/12/13  Yes Ripudeep Krystal Eaton, MD  atorvastatin (LIPITOR) 40 MG tablet Take 40 mg by mouth daily.   Yes Historical Provider, MD  carvedilol (COREG) 12.5 MG tablet Take 12.5 mg by mouth 2 (two) times daily with a meal.    Yes Historical Provider, MD  Cholecalciferol (VITAMIN D-3) 1000 UNITS CAPS Take 1 capsule by mouth daily.   Yes Historical Provider, MD  fluticasone (FLONASE) 50 MCG/ACT nasal spray Place 2 sprays into both nostrils daily.    Yes Historical Provider, MD  furosemide (LASIX) 40 MG tablet Take 1 tablet (40 mg total) by mouth daily. 09/18/13  Yes Larey Dresser, MD  glipiZIDE (GLUCOTROL XL) 10 MG 24 hr tablet Take 10 mg by mouth 2 (two) times daily.   Yes Historical Provider, MD  levothyroxine (SYNTHROID, LEVOTHROID) 50 MCG tablet Take 50 mcg by mouth daily before breakfast.   Yes Historical Provider, MD  omeprazole (PRILOSEC) 20 MG capsule Take 20 mg by mouth daily.   Yes Historical Provider, MD  OVER THE COUNTER MEDICATION Apply 1 application topically daily as needed (for stiffness in neck). "Muscle Reliever cream"   Yes Historical Provider, MD  potassium  chloride 20 MEQ TBCR Take 20 mEq by mouth daily. Take when taking lasix. 09/18/13  Yes Bobby Rumpf [place], PA-C    Allergies  Allergen Reactions  .  Metformin     REACTION: intolerant  . Promethazine Hcl     REACTION: u/k    Physical Exam  Vitals  Blood pressure 132/66, pulse 92, temperature 99.1 F (37.3 C), temperature source Oral, resp. rate 15, SpO2 96.00%.   1. General elderly somnolent white female lying in bed in NAD but confused  2. Psych exam unreliable.  3. No F.N deficits, ALL C.Nerves Intact, Strength 5/5 all 4 extremities, Sensation intact all 4 extremities, Plantars down going.  4. Ears and Eyes appear Normal, Conjunctivae clear, PERRLA. Moist Oral Mucosa.  5. Supple Neck, No JVD, No cervical lymphadenopathy appriciated, No Carotid Bruits.  6. Symmetrical Chest wall movement, Good air movement bilaterally, CTAB.  7. RRR, No Gallops, Rubs or Murmurs, No Parasternal Heave.  8. Positive Bowel Sounds, Abdomen Soft, No tenderness, No organomegaly appriciated,No rebound -guarding or rigidity.  9.  No Cyanosis, Normal Skin Turgor, No Skin Rash or Bruise.  10. Good muscle tone,  joints appear normal , no effusions, Normal ROM.  11. No Palpable Lymph Nodes in Neck or Axillae     Data Review  CBC  Recent Labs Lab 09/18/13 0424 09/24/13 0425 09/24/13 0429  WBC 13.5* 17.1*  --   HGB 11.6* 12.1 13.9  HCT 35.6* 36.0 41.0  PLT 216 174  --   MCV 90.1 89.8  --   MCH 29.4 30.2  --   MCHC 32.6 33.6  --   RDW 16.5* 16.1*  --   LYMPHSABS  --  1.4  --   MONOABS  --  2.4*  --   EOSABS  --  0.0  --   BASOSABS  --  0.0  --    ------------------------------------------------------------------------------------------------------------------  Chemistries   Recent Labs Lab 09/18/13 0424 09/24/13 0429  NA 142 136*  K 3.4* 3.2*  CL 99 95*  CO2 31  --   GLUCOSE 145* 82  BUN 43* 41*  CREATININE 1.19* 2.20*  CALCIUM 8.5  --     ------------------------------------------------------------------------------------------------------------------ CrCl is unknown because both a height and weight (above a minimum accepted value) are required for this calculation. ------------------------------------------------------------------------------------------------------------------ No results found for this basename: TSH, T4TOTAL, FREET3, T3FREE, THYROIDAB,  in the last 72 hours   Coagulation profile No results found for this basename: INR, PROTIME,  in the last 168 hours ------------------------------------------------------------------------------------------------------------------- No results found for this basename: DDIMER,  in the last 72 hours -------------------------------------------------------------------------------------------------------------------  Cardiac Enzymes No results found for this basename: CK, CKMB, TROPONINI, MYOGLOBIN,  in the last 168 hours ------------------------------------------------------------------------------------------------------------------ No components found with this basename: POCBNP,    ---------------------------------------------------------------------------------------------------------------  Urinalysis    Component Value Date/Time   COLORURINE YELLOW 09/24/2013 0439   APPEARANCEUR CLEAR 09/24/2013 0439   LABSPEC 1.013 09/24/2013 0439   PHURINE 6.5 09/24/2013 0439   GLUCOSEU NEGATIVE 09/24/2013 0439   HGBUR NEGATIVE 09/24/2013 [place] 09/24/2013 0439   BILIRUBINUR neg. 09/23/2013 1511   KETONESUR 15* 09/24/2013 0439   PROTEINUR 100* 09/24/2013 0439   PROTEINUR 100++ 09/23/2013 1511   UROBILINOGEN 1.0 09/24/2013 0439   UROBILINOGEN 0.2 09/23/2013 1511   NITRITE NEGATIVE 09/24/2013 0439   NITRITE neg. 09/23/2013 [place] 09/24/2013 0439     ----------------------------------------------------------------------------------------------------------------  Imaging results:   Dg Chest 2 View  09/24/2013   CLINICAL DATA:  Fatigue and headache.  EXAM: CHEST  2 VIEW  COMPARISON:  09/16/2013  FINDINGS: Postoperative changes in the mediastinum. Cardiac pacemaker. Borderline heart size with normal pulmonary vascularity.  Emphysematous changes and scattered fibrosis in the lungs. No focal airspace disease or consolidation. No blunting of costophrenic angles esophageal hiatal hernia behind the heart. Calcification of the aorta.  IMPRESSION: Emphysematous changes in the lungs. No focal airspace disease. Esophageal hiatal hernia behind the heart   Electronically Signed   By: Lucienne Capers M.D.   On: 09/24/2013 06:52   Dg Lumbar Spine Complete  09/24/2013   CLINICAL DATA:  Lower back pain.  EXAM: LUMBAR SPINE - COMPLETE 4+ VIEW  COMPARISON:  03/22/2012  FINDINGS: Normal alignment. Degenerative changes throughout the lumbar spine with degenerative disc disease at multiple levels. No vertebral compression deformities. No focal bone lesion or bone destruction. Extensive vascular calcifications. Degenerative changes demonstrates some progression since previous study. Otherwise no significant change.  IMPRESSION: Progressive degenerative changes in the lumbar spine. No acute bony abnormality suggested appear   Electronically Signed   By: Lucienne Capers M.D.   On: 09/24/2013 06:54   Ct Head Wo Contrast  09/24/2013   CLINICAL DATA:  Trouble walking.  Fatigue.  Headache.  EXAM: CT HEAD WITHOUT CONTRAST  TECHNIQUE: Contiguous axial images were obtained from the base of the skull through the vertex without intravenous contrast.  COMPARISON:  None.  FINDINGS: Diffuse prominence of the CSF containing spaces is compatible with generalized cerebral atrophy. Scattered and confluent hypodensity within the periventricular and deep white matter both cerebral  hemispheres is most consistent with moderate chronic microvascular ischemic disease. Prominent vascular calcifications present within the carotid siphons and distal vertebral arteries.  There is no acute intracranial hemorrhage or infarct. No mass lesion or midline shift. Gray-white matter differentiation is well maintained. Ventricles are normal in size without evidence of hydrocephalus. CSF containing spaces are within normal limits. No extra-axial fluid collection.  The calvarium is intact.  Orbital soft tissues are within normal limits.  There is partial opacification of the left sphenoid sinus, which appears chronic in nature. Paranasal sinuses are otherwise clear. No mastoid effusion. Paranasal sinuses and mastoid air cells are well pneumatized and free of fluid.  Scalp soft tissues are unremarkable.  IMPRESSION: 1. No acute intracranial process. 2. Atrophy with moderate chronic microvascular ischemic disease. 3. Chronic left sphenoid sinus disease.   Electronically Signed   By: Jeannine Boga M.D.   On: 09/24/2013 05:42       My personal review of EKG: Rhythm Paced Rythm    Assessment & Plan   1. ? Back pain ? Diskitis - seen by Neuro, exam non focal, no point tenderness, pt unreliable historian - get blood cultures, Emp IV ABX, D/W radiology get CT Myelogram (pacemaker-AKI), bed rest, Neuro to follow. Check lipase ? Back pain.     2.Somnolence - had accidental polypharmacy yesterday + #1 above, supportive care, minimize sedatives, Speech Eval, NPO, IVF. Head CT non acute, check UDS.    3. History of bovine aortic valve - no acute issues, echo noted from 07-2013      4. History of complete heart block status post placement of placement. No acute issues monitor .     5. Diabetes mellitus type 2. Check A1c, hold Glucotrol at home dose, sliding scale added q4hrs.     6. ARF on CK D. stage IV. Baseline creatinine around 1.4 to 1.5. Check Ur lytes, hold Lasix, IVF, has  foley in the ER, monitor.      7. History of iron deficiency anemia. Hemoccult-negative, no history of melena or blood in stool, monitor CBC.  8. GERD. On PPI.      9. Dyslipidemia. Continue statin home dose.      10. Essential hypertension. Continue coreg and monitor..      11. Old history of A. fib in the chart. Currently in sinus monitor on telemetry.      12. Hypothyroidism. Continue home dose Synthroid. Check TSH.     13. Low K replaced.     14.CAD - on ASA, Coreg chest pain free, no acute issues.     85. Ur retention with ARF - place foley.      DVT Prophylaxis  SCDs    AM Labs Ordered, also please review Full Orders  Family Communication: Admission, patients condition and plan of care including tests being ordered have been discussed with the patient and son [facility]) who indicate understanding and agree with the plan and Code Status.  Code Status Full  Likely DC to  TBD  Condition GUARDED    Time spent in minutes : 45    Savien Mamula K M.D on 09/24/2013 at 11:18 AM  Between 7am to 7pm - Pager - [phone]  After 7pm go to [email or web address] - password [email or web address]  And look for the night coverage person covering me after hours  [facility] Office  [phone]   **Disclaimer: This note may have been dictated with voice recognition software. Similar sounding words can inadvertently be transcribed and this note may contain transcription errors which may not have been corrected upon publication of note.**

## 2013-09-24 NOTE — Care Management Note (Addendum)
    Page 1 of 2   09/29/2013     10:53:43 AM CARE MANAGEMENT NOTE 09/29/2013  Patient:  Barbara Garner, Barbara Garner   Account Number:  [id number]  Date Initiated:  09/24/2013  Documentation initiated by:  GRAVES-BIGELOW,Tai Skelly  Subjective/Objective Assessment:   Pt admitted for lethargy and headache. Question accidental polypharmacy.     Action/Plan:   CM will continue to monitor for disposition needs.   Anticipated DC Date:  09/26/2013   Anticipated DC Plan:  [place]  CM consult      [facility] Choice  HOME HEALTH   Choice offered to / List presented to:  C-4 Adult Children        HH arranged  HH-2 PT      [place]   Status of service:  Completed, signed off Medicare Important Message given?  NA - LOS <3 / Initial given by admissions (If response is "NO", the following Medicare IM given date fields will be blank) Date Medicare IM given:   Medicare IM given by:   Date Additional Medicare IM given:  09/29/2013 Additional Medicare IM given by:  Teandre Hamre GRAVES-BIGELOW  Discharge Disposition:  [place]  Per UR Regulation:  Reviewed for med. necessity/level of care/duration of stay  If discussed at [place] of Stay Meetings, dates discussed:   09/30/2013    Comments:   09-29-13 [address] RN,BSN [phone] CM did provide pt with Medicare IM. Plan for d/c today to SNF. CSW assisting with disposition needs.   09/26/13- 1600- Marvetta Gibbons RN, BSN [phone] Pt for d/c home today- order for HH-PT - in to speak with pt- son also in room- (also spoke with son-Mickey via TC) discussed HH-PT recommendation and order- pt agreeable- states that she thinks she has used [place] in the past- Son-Mickey is agreeable to referral with [facility] and if [facility] not [facility] referral then second choice would be [facility]. Referral called to [facility] with [facility] who confirmed that they could take referral - services  to start on 09/29/13

## 2013-09-24 NOTE — Evaluation (Signed)
Clinical/Bedside Swallow Evaluation Patient Details  Name: Barbara Garner MRN: [id number] Date of Birth: [birth year]  Today's Date: 09/24/2013 Time: 1330-1350 SLP Time Calculation (min): 20 min  Past Medical History:  Past Medical History  Diagnosis Date  . Iron deficiency anemia, unspecified   . Chronic diastolic CHF (congestive heart failure)   . CAD  S/P CABG x 3 08/2011     LIMA to diagonal branch, SVG to OM1, SVG to PDA, EVH via bilateral thighs  . Type II or unspecified type diabetes mellitus without mention of complication, not stated as uncontrolled   . Contact dermatitis and other eczema, due to unspecified cause   . Esophageal reflux   . Headache(784.0)   . Pacemaker MDT dual chamber     DOI 08/2011  . Pure hypercholesterolemia   . hypertension   . Hypothyroidism   . Atrioventricular block, complete -intermittent   . Memory loss   . Idiopathic peripheral neuropathy   . LBBB (left bundle branch block)   . Carotid stenosis     Carotid dopplers 0000000 with [id number] LICA stenosis  . Severe aortic stenosis -S/P AVR      19 mm [facility] Ease pericardial tissue valve  . Extrinsic asthma, unspecified     hasn't used inhaler in past year  . CKD (chronic kidney disease) stage 4, GFR 15-29 ml/min     Cr 1.6 in 6/11, sees Dr. Arty Baumgartner  . Impaired vision     pt. reports that she identifies her meds by looking at the pillls, she is not able to read labels on bottles   Past Surgical History:  Past Surgical History  Procedure Laterality Date  . Ptca  [phone]    5 blockages  . Cardiac catheterization    . Esophageal dilation  1997  . Colonoscopy  04/1999    1 polyp  . Esophagogastroduodenoscopy  04/1999    HH; "watermelon stomach" (severe gastritis)  . Dexa  10/2003 ,09/2005    osteoporosis,  Osteopenia  . Cataract extraction, bilateral  2003  . Esophagogastroduodenoscopy  04/2002    Gastritis  . Cardiovascular stress test  10/2002    normal (per patient)  . Abdominal  hysterectomy  1977    partial; fibroids  . Bladder tack  1977  . Colonoscopy  06/2004    Neg. Int hem  . Carotid dopplers  2006  . Refractive surgery  2006  . Opthy  11/1998;12/01;11/02  . Aortic valve replacement  09/06/2011    Procedure: AORTIC VALVE REPLACEMENT (AVR);  Surgeon: Rexene Alberts, MD;  Location: [place];  Service: Open Heart Surgery;  Laterality: N/A;  . Coronary artery bypass graft  09/06/2011    Procedure: CORONARY ARTERY BYPASS GRAFTING (CABG);  Surgeon: Rexene Alberts, MD;  Location: [place];  Service: Open Heart Surgery;  Laterality: N/A;  . Colonoscopy  08/2013   HPI:  Barbara Garner is a 79 year old female, with history of CAD status post CABG, Chronic diastolic dysfunction EF in 2013 was 60%, Complete heart block status post pacemaker placement, status post aortic valve replacement (bovine), LBBB, HTN, Hypothyroidism, Dyslipidemia, CK D4 baseline creatinine around 1.5, iron deficiency anemia, type 2 diabetes mellitus, GERD, recent hospitalization 6/30 to 09/18/13 for healthcare associated pneumonia and acute on chronic CHF.  Patient brought in by her son for generalized weakness, poor oral intake, accidental medication OD, and started complaining of back pain 09/23/13; was brought to the ED by son 09/23/13, workup inconclusive Neuro was consulted who  requested Myelogram and Hospitalist admission.   Assessment / Plan / Recommendation Clinical Impression  Patient presents with an unremarkable oral mech exam and no new neuro findings at time to eval. Trials of regular textures and thin liquids resulted in delayed throat clear x1 following consecutive straw sips; however, 2 additional trials resulted in no overt s/s of aspiration.  Recommend to initiate a regular texture and thin liquid diet with follow-up x1 for education and to ensure toleration given history GERD, and recent RLL PNA.       Aspiration Risk  Mild    Diet Recommendation Regular;Thin liquid   Liquid Administration via:  Cup;Straw Medication Administration: Whole meds with liquid Supervision: Patient able to self feed Compensations: Slow rate;Small sips/bites Postural Changes and/or Swallow Maneuvers: Seated upright 90 degrees;Upright 30-60 min after meal    Other  Recommendations Oral Care Recommendations: Oral care BID   Follow Up Recommendations  None    Frequency and Duration min 2x/week  1 week   Pertinent Vitals/Pain none    SLP Swallow Goals  See care plan for details    Swallow Study Prior Functional Status  Regular textures and thin liquids per patient and son's report     General Date of Onset: 09/23/13 HPI: Barbara Garner is a 79 year old female, with history of CAD status post CABG, Chronic diastolic dysfunction EF in 2013 was 60%, Complete heart block status post pacemaker placement, status post aortic valve replacement (bovine), LBBB, HTN, Hypothyroidism, Dyslipidemia, CK D4 baseline creatinine around 1.5, iron deficiency anemia, type 2 diabetes mellitus, GERD, recent hospitalization 6/30 to 09/18/13 for healthcare associated pneumonia and acute on chronic CHF.  Patient brought in by her son for generalized weakness, poor oral intake, accidental medication OD, and started complaining of back pain 09/23/13; was brought to the ED by son 09/23/13, workup inconclusive Neuro was consulted who requested Myelogram and Hospitalist admission. Type of Study: Bedside swallow evaluation Previous Swallow Assessment: none on record Diet Prior to this Study: NPO Temperature Spikes Noted: No Respiratory Status: Room air History of Recent Intubation: No Behavior/Cognition: Alert;Cooperative;Pleasant mood Oral Cavity - Dentition: Dentures, top;Dentures, bottom Self-Feeding Abilities: Able to feed self Patient Positioning: Upright in bed Baseline Vocal Quality: Clear Volitional Cough: Strong Volitional Swallow: Able to elicit    Oral/Motor/Sensory Function Overall Oral Motor/Sensory Function: Appears within  functional limits for tasks assessed   Ice Chips Ice chips: Not tested   Thin Liquid Thin Liquid: Within functional limits Presentation: Cup;Straw Pharyngeal  Phase Impairments: Throat Clearing - Delayed Other Comments: throat clear x1 following consecutive straw sips next two trials resulted in no overt s/s    Nectar Thick Nectar Thick Liquid: Not tested   Honey Thick Honey Thick Liquid: Not tested   Puree Puree: Within functional limits Presentation: Self Fed;Spoon   Solid   GO    Solid: Within functional limits Presentation: Self Fed Other Comments: slight oral residue that was managed with thin liquid sips       Gunnar Fusi, M.A., CCC-SLP [phone]   Melita Villalona 09/24/2013,3:05 PM

## 2013-09-24 NOTE — ED Notes (Signed)
IV team at bedside 

## 2013-09-24 NOTE — ED Notes (Signed)
[name], MD contacted bed placement re: bed request, pt & family updated

## 2013-09-24 NOTE — Consult Note (Addendum)
NEURO HOSPITALIST CONSULT NOTE    Reason for Consult: neck and back pain  HPI:                                                                                                                                          Barbara Garner is an 79 y.o. female with multiple medical problems as delineated below, recently discharged from the hospital, comes back for further evaluation of the above stated symptoms. Initial evaluation in the ED revealed diminished recta tone and upper-lower back tenderness, as well as modest leukocytosis and neurology consulted due to concern for epidural abscess. Patient has chronic neck and back pain that became worse in the past 24 or 48 hours and started having trouble walking and feeling weak last night. Denies HA, vertigo, double vision, focal weakness, falls, slurred speech, language or vision impairment. No recent fevers or skin rash.    Past Medical History  Diagnosis Date  . Iron deficiency anemia, unspecified   . Chronic diastolic CHF (congestive heart failure)   . CAD  S/P CABG x 3 08/2011     LIMA to diagonal branch, SVG to OM1, SVG to PDA, EVH via bilateral thighs  . Type II or unspecified type diabetes mellitus without mention of complication, not stated as uncontrolled   . Contact dermatitis and other eczema, due to unspecified cause   . Esophageal reflux   . Headache(784.0)   . Pacemaker MDT dual chamber     DOI 08/2011  . Pure hypercholesterolemia   . hypertension   . Hypothyroidism   . Atrioventricular block, complete -intermittent   . Memory loss   . Idiopathic peripheral neuropathy   . LBBB (left bundle branch block)   . Carotid stenosis     Carotid dopplers 0000000 with [id number] LICA stenosis  . Severe aortic stenosis -S/P AVR      19 mm [facility] Ease pericardial tissue valve  . Extrinsic asthma, unspecified     hasn't used inhaler in past year  . CKD (chronic kidney disease) stage 4, GFR 15-29 ml/min     Cr 1.6 in 6/11,  sees Dr. Arty Baumgartner  . Impaired vision     pt. reports that she identifies her meds by looking at the pillls, she is not able to read labels on bottles    Past Surgical History  Procedure Laterality Date  . Ptca  [phone]    5 blockages  . Cardiac catheterization    . Esophageal dilation  1997  . Colonoscopy  04/1999    1 polyp  . Esophagogastroduodenoscopy  04/1999    HH; "watermelon stomach" (severe gastritis)  . Dexa  10/2003 ,09/2005    osteoporosis,  Osteopenia  . Cataract extraction, bilateral  2003  . Esophagogastroduodenoscopy  04/2002  Gastritis  . Cardiovascular stress test  10/2002    normal (per patient)  . Abdominal hysterectomy  1977    partial; fibroids  . Bladder tack  1977  . Colonoscopy  06/2004    Neg. Int hem  . Carotid dopplers  2006  . Refractive surgery  2006  . Opthy  11/1998;12/01;11/02  . Aortic valve replacement  09/06/2011    Procedure: AORTIC VALVE REPLACEMENT (AVR);  Surgeon: Rexene Alberts, MD;  Location: [place];  Service: Open Heart Surgery;  Laterality: N/A;  . Coronary artery bypass graft  09/06/2011    Procedure: CORONARY ARTERY BYPASS GRAFTING (CABG);  Surgeon: Rexene Alberts, MD;  Location: [place];  Service: Open Heart Surgery;  Laterality: N/A;    Family History  Problem Relation Age of Onset  . Stroke Father     died in his 51's  . Stroke Mother     died in her 2's    Social History:  reports that she has never smoked. She has never used smokeless tobacco. She reports that she does not drink alcohol or use illicit drugs.  Allergies  Allergen Reactions  . Metformin     REACTION: intolerant  . Promethazine Hcl     REACTION: u/k    MEDICATIONS:                                                                                                                     I have reviewed the patient's current medications.   ROS:                                                                                                                                        History obtained from the patient, family, and chart review.  General ROS: negative for - chills, fever, night sweats, or weight gain Psychological ROS: negative for - behavioral disorder, hallucinations, memory difficulties, mood swings or suicidal ideation Ophthalmic ROS: negative for - blurry vision, double vision, eye pain or loss of vision ENT ROS: negative for - epistaxis, nasal discharge, oral lesions, sore throat, tinnitus or vertigo Allergy and Immunology ROS: negative for - hives or itchy/watery eyes Hematological and Lymphatic ROS: negative for - bleeding problems, bruising or swollen lymph nodes Endocrine ROS: negative for - galactorrhea, hair pattern changes, polydipsia/polyuria or temperature intolerance Respiratory ROS: negative for - cough, hemoptysis, shortness of breath or wheezing Cardiovascular ROS:  negative for - chest pain, dyspnea on exertion, edema or irregular heartbeat Gastrointestinal ROS: negative for - abdominal pain, diarrhea, hematemesis, nausea/vomiting or stool incontinence Genito-Urinary ROS: negative for - dysuria, hematuria, incontinence or urinary frequency/urgency Musculoskeletal ROS: negative for - joint swelling or muscular weakness Neurological ROS: as noted in HPI Dermatological ROS: negative for rash and skin lesion changes  Physical exam: pleasant female in no apparent distress.Blood pressure 145/62, pulse 85, temperature 99.1 F (37.3 C), temperature source Oral, resp. rate 19, SpO2 98.00%.  Head: normocephalic. Neck: supple, no bruits, no JVD. Cardiac: no murmurs. Lungs: clear. Abdomen: soft, no tender, no mass. Extremities: no edema.Mental Status: Neurologic Examination:                                                                                                      Alert, oriented, thought content appropriate.  Speech fluent without evidence of aphasia.  Able to follow 3 step commands without difficulty. Cranial  Nerves: II: Discs flat bilaterally; Visual fields grossly normal, pupils equal, round, reactive to light and accommodation III,IV, VI: ptosis not present, extra-ocular motions intact bilaterally V,VII: smile symmetric, facial light touch sensation normal bilaterally VIII: hearing normal bilaterally IX,X: gag reflex present XI: bilateral shoulder shrug XII: midline tongue extension without atrophy or fasciculations Motor: 5/5 upper and lower extremities bilaterally. Sensory: Pinprick and light touch intact throughout, bilaterally. Can not appreciate a sensory level. Deep Tendon Reflexes:  Right: Upper Extremity   Left: Upper extremity   biceps (C-5 to C-6) 2/4   biceps (C-5 to C-6) 2/4 tricep (C7) 2/4    triceps (C7) 2/4 Brachioradialis (C6) 2/4  Brachioradialis (C6) 2/4  Lower Extremity Lower Extremity  quadriceps (L-2 to L-4) 2/4   quadriceps (L-2 to L-4) 2/4 Achilles (S1) 2/4   Achilles (S1) 2/4  Plantars: Right: downgoing   Left: downgoing Cerebellar: normal finger-to-nose,  normal heel-to-shin test Gait:  No tested.   Lab Results  Component Value Date/Time   CHOL 145 08/23/2012 12:04 PM    Results for orders placed during the hospital encounter of 09/24/13 (from the past 48 hour(s))  CBC WITH DIFFERENTIAL     Status: Abnormal   Collection Time    09/24/13  4:25 AM      Result Value Ref Range   WBC 17.1 (*) 4.0 - 10.5 K/uL   RBC 4.01  3.87 - 5.11 MIL/uL   Hemoglobin 12.1  12.0 - 15.0 g/dL   HCT 36.0  36.0 - 46.0 %   MCV 89.8  78.0 - 100.0 fL   MCH 30.2  26.0 - 34.0 pg   MCHC 33.6  30.0 - 36.0 g/dL   RDW 16.1 (*) 11.5 - 15.5 %   Platelets 174  150 - 400 K/uL   Neutrophils Relative % 78 (*) 43 - 77 %   Neutro Abs 13.4 (*) 1.7 - 7.7 K/uL   Lymphocytes Relative 8 (*) 12 - 46 %   Lymphs Abs 1.4  0.7 - 4.0 K/uL   Monocytes Relative 14 (*) 3 - 12 %   Monocytes Absolute 2.4 (*)  0.1 - 1.0 K/uL   Eosinophils Relative 0  0 - 5 %   Eosinophils Absolute 0.0  0.0 - 0.7 K/uL    Basophils Relative 0  0 - 1 %   Basophils Absolute 0.0  0.0 - 0.1 K/uL  LACTIC ACID, PLASMA     Status: None   Collection Time    09/24/13  4:25 AM      Result Value Ref Range   Lactic Acid, Venous 1.2  0.5 - 2.2 mmol/L  SEDIMENTATION RATE     Status: Abnormal   Collection Time    09/24/13  4:25 AM      Result Value Ref Range   Sed Rate 89 (*) 0 - 22 mm/hr  I-STAT CHEM 8, ED     Status: Abnormal   Collection Time    09/24/13  4:29 AM      Result Value Ref Range   Sodium 136 (*) 137 - 147 mEq/L   Potassium 3.2 (*) 3.7 - 5.3 mEq/L   Chloride 95 (*) 96 - 112 mEq/L   BUN 41 (*) 6 - 23 mg/dL   Creatinine, Ser 2.20 (*) 0.50 - 1.10 mg/dL   Glucose, Bld 82  70 - 99 mg/dL   Calcium, Ion 0.90 (*) 1.13 - 1.30 mmol/L   TCO2 26  0 - 100 mmol/L   Hemoglobin 13.9  12.0 - 15.0 g/dL   HCT 41.0  36.0 - 46.0 %  URINALYSIS, ROUTINE W REFLEX MICROSCOPIC     Status: Abnormal   Collection Time    09/24/13  4:39 AM      Result Value Ref Range   Color, Urine YELLOW  YELLOW   APPearance CLEAR  CLEAR   Specific Gravity, Urine 1.013  1.005 - 1.030   pH 6.5  5.0 - 8.0   Glucose, UA NEGATIVE  NEGATIVE mg/dL   Hgb urine dipstick NEGATIVE  NEGATIVE   Bilirubin Urine NEGATIVE  NEGATIVE   Ketones, ur 15 (*) NEGATIVE mg/dL   Protein, ur 100 (*) NEGATIVE mg/dL   Urobilinogen, UA 1.0  0.0 - 1.0 mg/dL   Nitrite NEGATIVE  NEGATIVE   Leukocytes, UA NEGATIVE  NEGATIVE  URINE MICROSCOPIC-ADD ON     Status: Abnormal   Collection Time    09/24/13  4:39 AM      Result Value Ref Range   Squamous Epithelial / LPF FEW (*) RARE   WBC, UA 0-2  <3 WBC/hpf   RBC / HPF 0-2  <3 RBC/hpf   Bacteria, UA RARE  RARE   Casts HYALINE CASTS (*) NEGATIVE  I-STAT TROPOININ, ED     Status: None   Collection Time    09/24/13  5:04 AM      Result Value Ref Range   Troponin i, poc 0.01  0.00 - 0.08 ng/mL   Comment 3            Comment: Due to the release kinetics of cTnI,     a negative result within the first hours      of the onset of symptoms does not rule out     myocardial infarction with certainty.     If myocardial infarction is still suspected,     repeat the test at appropriate intervals.  I-STAT CG4 LACTIC ACID, ED     Status: None   Collection Time    09/24/13  5:06 AM      Result Value Ref Range   Lactic Acid, Venous 1.38  0.5 - 2.2 mmol/L  CBG MONITORING, ED     Status: None   Collection Time    09/24/13  6:09 AM      Result Value Ref Range   Glucose-Capillary 82  70 - 99 mg/dL    Ct Head Wo Contrast  09/24/2013   CLINICAL DATA:  Trouble walking.  Fatigue.  Headache.  EXAM: CT HEAD WITHOUT CONTRAST  TECHNIQUE: Contiguous axial images were obtained from the base of the skull through the vertex without intravenous contrast.  COMPARISON:  None.  FINDINGS: Diffuse prominence of the CSF containing spaces is compatible with generalized cerebral atrophy. Scattered and confluent hypodensity within the periventricular and deep white matter both cerebral hemispheres is most consistent with moderate chronic microvascular ischemic disease. Prominent vascular calcifications present within the carotid siphons and distal vertebral arteries.  There is no acute intracranial hemorrhage or infarct. No mass lesion or midline shift. Gray-white matter differentiation is well maintained. Ventricles are normal in size without evidence of hydrocephalus. CSF containing spaces are within normal limits. No extra-axial fluid collection.  The calvarium is intact.  Orbital soft tissues are within normal limits.  There is partial opacification of the left sphenoid sinus, which appears chronic in nature. Paranasal sinuses are otherwise clear. No mastoid effusion. Paranasal sinuses and mastoid air cells are well pneumatized and free of fluid.  Scalp soft tissues are unremarkable.  IMPRESSION: 1. No acute intracranial process. 2. Atrophy with moderate chronic microvascular ischemic disease. 3. Chronic left sphenoid sinus disease.    Electronically Signed   By: Jeannine Boga M.D.   On: 09/24/2013 05:42   Assessment/Plan: 79 y/o with multiple medical problems including chronic neck and back pain that had worsened in the past 24-48 hours, associated with difficulty walking. Modest leukocytosis and decreased rectal tone but no sensory level, weakness, or obvious myelopathic findings on exam. DTR's are preserved. Afebrile but some concerns raised about epidural abscess. Patient can not have MRI due to pacemaker and has serum Cr above 2 which precludes CT myelogram or CT with contrast. Will suggest antibiotic coverage and hydration to try to improve her renal function and then pursue CT myelogram. Will follow up.  Dorian Pod, MD 09/24/2013, 6:54 AM

## 2013-09-24 NOTE — Progress Notes (Signed)
ANTIBIOTIC CONSULT NOTE - INITIAL  Pharmacy Consult for Vancomycin, Meropenem  Indication: Diskitis   Allergies  Allergen Reactions  . Metformin     REACTION: intolerant  . Promethazine Hcl     REACTION: u/k    Patient Measurements: Height: 5' 1.81" (157 cm) Weight: 114 lb 3.2 oz (51.8 kg) IBW/kg (Calculated) : 49.67 Adjusted Body Weight: n/a  Vital Signs: Temp: 99.1 F (37.3 C) (07/08 0331) Temp src: Oral (07/08 0331) BP: 124/70 mmHg (07/08 1141) Pulse Rate: 91 (07/08 1141) Intake/Output from previous day:   Intake/Output from this shift: Total I/O In: -  Out: 400 [Urine:400]  Labs:  Recent Labs  09/23/13 1515 09/24/13 0425 09/24/13 0429  WBC 18.5 Repeated and verified X2.* 17.1*  --   HGB 12.4 12.1 13.9  PLT 176.0 174  --   CREATININE 2.4*  --  2.20*   Estimated Creatinine Clearance: 16.8 ml/min (by C-G formula based on Cr of 2.2). No results found for this basename: VANCOTROUGH, Corlis Leak, VANCORANDOM, [place], GENTPEAK, GENTRANDOM, TOBRATROUGH, TOBRAPEAK, TOBRARND, AMIKACINPEAK, AMIKACINTROU, AMIKACIN,  in the last 72 hours   Microbiology: Recent Results (from the past 720 hour(s))  URINE CULTURE     Status: None   Collection Time    09/16/13  1:36 PM      Result Value Ref Range Status   Specimen Description URINE, CATHETERIZED   Final   Special Requests NONE   Final   Culture  Setup Time     Final   Value: 09/17/2013 02:48     Performed at [facility]     Final   Value: >=100,000 COLONIES/ML     Performed at [facility]   Culture     Final   Value: ESCHERICHIA COLI     Performed at [facility]   Report Status 09/18/2013 FINAL   Final   Organism ID, Bacteria ESCHERICHIA COLI   Final  CULTURE, BLOOD (ROUTINE X 2)     Status: None   Collection Time    09/16/13  1:40 PM      Result Value Ref Range Status   Specimen Description BLOOD LEFT WRIST   Final   Special Requests BOTTLES DRAWN AEROBIC ONLY 3CC    Final   Culture  Setup Time     Final   Value: 09/16/2013 18:45     Performed at [facility]   Culture     Final   Value: NO GROWTH 5 DAYS     Performed at [facility]   Report Status 09/22/2013 FINAL   Final    Medical History: Past Medical History  Diagnosis Date  . Iron deficiency anemia, unspecified   . Chronic diastolic CHF (congestive heart failure)   . CAD  S/P CABG x 3 08/2011     LIMA to diagonal branch, SVG to OM1, SVG to PDA, EVH via bilateral thighs  . Type II or unspecified type diabetes mellitus without mention of complication, not stated as uncontrolled   . Contact dermatitis and other eczema, due to unspecified cause   . Esophageal reflux   . Headache(784.0)   . Pacemaker MDT dual chamber     DOI 08/2011  . Pure hypercholesterolemia   . hypertension   . Hypothyroidism   . Atrioventricular block, complete -intermittent   . Memory loss   . Idiopathic peripheral neuropathy   . LBBB (left bundle branch block)   . Carotid stenosis  Carotid dopplers 0000000 with [id number] LICA stenosis  . Severe aortic stenosis -S/P AVR      19 mm [facility] Ease pericardial tissue valve  . Extrinsic asthma, unspecified     hasn't used inhaler in past year  . CKD (chronic kidney disease) stage 4, GFR 15-29 ml/min     Cr 1.6 in 6/11, sees Dr. Arty Baumgartner  . Impaired vision     pt. reports that she identifies her meds by looking at the pillls, she is not able to read labels on bottles    Medications:  Prescriptions prior to admission  Medication Sig Dispense Refill  . aspirin EC 81 MG EC tablet Take 1 tablet (81 mg total) by mouth daily.  30 tablet  3  . atorvastatin (LIPITOR) 40 MG tablet Take 40 mg by mouth daily.      . carvedilol (COREG) 12.5 MG tablet Take 12.5 mg by mouth 2 (two) times daily with a meal.       . Cholecalciferol (VITAMIN D-3) 1000 UNITS CAPS Take 1 capsule by mouth daily.      . fluticasone (FLONASE) 50 MCG/ACT nasal spray Place 2 sprays into  both nostrils daily.       . furosemide (LASIX) 40 MG tablet Take 1 tablet (40 mg total) by mouth daily.  90 tablet  1  . glipiZIDE (GLUCOTROL XL) 10 MG 24 hr tablet Take 10 mg by mouth 2 (two) times daily.      Marland Kitchen levothyroxine (SYNTHROID, LEVOTHROID) 50 MCG tablet Take 50 mcg by mouth daily before breakfast.      . omeprazole (PRILOSEC) 20 MG capsule Take 20 mg by mouth daily.      Marland Kitchen OVER THE COUNTER MEDICATION Apply 1 application topically daily as needed (for stiffness in neck). "Muscle Reliever cream"      . potassium chloride 20 MEQ TBCR Take 20 mEq by mouth daily. Take when taking lasix.  30 tablet  0   Assessment: 79 YOF with back pain rules as diskitis to start meropenem and vancomycin. WBC elevated at 17.1. Pt is afebrile. She has poor renal fx due to CKD with CrCl ~ 17 mL/min. Pt has already received one dose of Vancomycin 1 gm IV x 1 dose today.  7/8 Blood Cx x2>> 7/8 Urine Cx>>   Goal of Therapy:  Vancomycin trough level 15-20 mcg/ml  Plan:  1) Start patient on Vancomycin 750 mg IV Q 48 hours starting on 7/10  2) Start Meropenem 500 mg IV Q 12 hours 3) Monitor CBC, renal fx, cultures and patient's clinical progress 4) Vancomycin trough as appropriate   Albertina Parr, PharmD.  Clinical Pharmacist Pager [phone]     Lavenia Atlas 09/24/2013,12:07 PM

## 2013-09-24 NOTE — ED Notes (Signed)
phlebotomy at bedside at this time

## 2013-09-25 ENCOUNTER — Encounter (HOSPITAL_COMMUNITY): Payer: Self-pay

## 2013-09-25 ENCOUNTER — Ambulatory Visit: Payer: Medicare Other | Admitting: Internal Medicine

## 2013-09-25 ENCOUNTER — Inpatient Hospital Stay (HOSPITAL_COMMUNITY): Payer: Medicare Other

## 2013-09-25 LAB — CBC
HEMATOCRIT: 32.2 % — AB (ref 36.0–46.0)
HEMOGLOBIN: 10.4 g/dL — AB (ref 12.0–15.0)
MCH: 30 pg (ref 26.0–34.0)
MCHC: 32.3 g/dL (ref 30.0–36.0)
MCV: 92.8 fL (ref 78.0–100.0)
Platelets: 134 10*3/uL — ABNORMAL LOW (ref 150–400)
RBC: 3.47 MIL/uL — AB (ref 3.87–5.11)
RDW: 16.3 % — ABNORMAL HIGH (ref 11.5–15.5)
WBC: 12.7 10*3/uL — ABNORMAL HIGH (ref 4.0–10.5)

## 2013-09-25 LAB — CSF CELL COUNT WITH DIFFERENTIAL
RBC Count, CSF: 2825 /mm3 — ABNORMAL HIGH
Tube #: 1
WBC, CSF: 3 /mm3 (ref 0–5)

## 2013-09-25 LAB — BASIC METABOLIC PANEL
Anion gap: 14 (ref 5–15)
BUN: 35 mg/dL — AB (ref 6–23)
CHLORIDE: 101 meq/L (ref 96–112)
CO2: 24 meq/L (ref 19–32)
Calcium: 7.5 mg/dL — ABNORMAL LOW (ref 8.4–10.5)
Creatinine, Ser: 1.56 mg/dL — ABNORMAL HIGH (ref 0.50–1.10)
GFR calc Af Amer: 36 mL/min — ABNORMAL LOW (ref >90)
GFR calc non Af Amer: 31 mL/min — ABNORMAL LOW (ref >90)
GLUCOSE: 123 mg/dL — AB (ref 70–99)
POTASSIUM: 4 meq/L (ref 3.7–5.3)
Sodium: 139 mEq/L (ref 137–147)

## 2013-09-25 LAB — URINE CULTURE
COLONY COUNT: NO GROWTH
COLONY COUNT: NO GROWTH
Culture: NO GROWTH
ORGANISM ID, BACTERIA: NO GROWTH

## 2013-09-25 LAB — PROTEIN AND GLUCOSE, CSF
Glucose, CSF: 77 mg/dL — ABNORMAL HIGH (ref 43–76)
Total  Protein, CSF: 41 mg/dL (ref 15–45)

## 2013-09-25 LAB — GLUCOSE, CAPILLARY
GLUCOSE-CAPILLARY: 107 mg/dL — AB (ref 70–99)
Glucose-Capillary: 153 mg/dL — ABNORMAL HIGH (ref 70–99)
Glucose-Capillary: 151 mg/dL — ABNORMAL HIGH (ref 70–99)

## 2013-09-25 LAB — GRAM STAIN

## 2013-09-25 LAB — CRYPTOCOCCAL ANTIGEN, CSF: Crypto Ag: NEGATIVE

## 2013-09-25 MED ORDER — VANCOMYCIN HCL IN DEXTROSE 750-5 MG/150ML-% IV SOLN: 750.0000 mg | INTRAVENOUS | Status: DC

## 2013-09-25 MED ORDER — IOHEXOL 300 MG/ML  SOLN
10.0000 mL | Freq: Once | INTRAMUSCULAR | Status: AC | PRN
Start: 2013-09-25 — End: 2013-09-25
  Administered 2013-09-25: 10 mL via INTRATHECAL

## 2013-09-25 MED ORDER — VANCOMYCIN HCL 500 MG IV SOLR
500.0000 mg | INTRAVENOUS | Status: DC
  Administered 2013-09-25 – 2013-09-26 (×2): 500 mg via INTRAVENOUS
  Filled 2013-09-25 (×3): qty 500

## 2013-09-25 NOTE — Progress Notes (Signed)
[facility] PROGRESS NOTE  [facility] [id number] DOB: 14-Aug-1934 DOA: 09/24/2013 PCP: Loura Pardon, MD  Assessment/Plan: Principal Problem:   Diskitis - Neurology consulted and further work up underway. Final recommendations pending - Currently on broad-spectrum antibiotics but was not felt to be infectious we'll plan on discontinuing antibiotics.  Active Problems:   HYPOTHYROIDISM - Stable, last TSH within normal limits. -Continue current Synthroid regimen     DIABETES MELLITUS, TYPE II - Continue diabetic diet and sliding scale insulin. Stable currently    HYPERTENSION - Patient currently on carvedilol, with blood pressures relatively well controlled    CORONARY ARTERY DISEASE - Patient on aspirin and statin. Currently staying    GERD - Stable on PPI  History of complete heart block status post placement of placement. - No acute issues monitor .   Code Status: full Family Communication: discussed with pt and son at bedside. Disposition Plan: Pending further evaluation and recommendations.   Consultants:  Neurology  Procedures:  Please see neuro note  Antibiotics:  Meropenem and vancomycin  HPI/Subjective: No new complaints. Pain relatively well controlled  Objective: Filed Vitals:   09/25/13 1435  BP: 146/68  Pulse: 81  Temp: 97.4 F (36.3 C)  Resp: 18    Intake/Output Summary (Last 24 hours) at 09/25/13 1532 Last data filed at 09/25/13 0802  Gross per 24 hour  Intake      0 ml  Output    850 ml  Net   -850 ml   Filed Weights   09/24/13 1141 09/25/13 0630  Weight: 51.8 kg (114 lb 3.2 oz) 52 kg (114 lb 10.2 oz)    Exam:   General:  Patient in no acute distress, alert and awake  Cardiovascular:+S1 and S2, warm extremities no cyanosis  Respiratory: Clear to auscultation bilaterally, no wheezes  Abdomen: Soft, nondistended, nontender  Musculoskeletal: No cyanosis or clubbing   Data Reviewed: Basic Metabolic  Panel:  Recent Labs Lab 09/23/13 1515 09/24/13 0429 09/25/13 0453  NA 136 136* 139  K 4.6 3.2* 4.0  CL 94* 95* 101  CO2 28  --  24  GLUCOSE 152* 82 123*  BUN 48* 41* 35*  CREATININE 2.4* 2.20* 1.56*  CALCIUM 8.3*  --  7.5*   Liver Function Tests:  Recent Labs Lab 09/23/13 1515  AST 23  ALT 12  ALKPHOS 84  BILITOT 0.9  PROT 8.2  ALBUMIN 3.6    Recent Labs Lab 09/24/13 1412  LIPASE 18   No results found for this basename: AMMONIA,  in the last 168 hours CBC:  Recent Labs Lab 09/23/13 1515 09/24/13 0425 09/24/13 0429 09/25/13 0453  WBC 18.5 Repeated and verified X2.* 17.1*  --  12.7*  NEUTROABS 15.2* 13.4*  --   --   HGB 12.4 12.1 13.9 10.4*  HCT 37.7 36.0 41.0 32.2*  MCV 92.0 89.8  --  92.8  PLT 176.0 174  --  134*   Cardiac Enzymes: No results found for this basename: CKTOTAL, CKMB, CKMBINDEX, TROPONINI,  in the last 168 hours BNP (last 3 results)  Recent Labs  08/09/13 0850 09/16/13 1120  PROBNP 7547.0* 11854.0*   CBG:  Recent Labs Lab 09/24/13 1209 09/24/13 1722 09/24/13 2200 09/24/13 2236 09/25/13 0759  GLUCAP 152* 124* 61* 80 107*    Recent Results (from the past 240 hour(s))  URINE CULTURE     Status: None   Collection Time    09/16/13  1:36 PM      Result Value  Ref Range Status   Specimen Description URINE, CATHETERIZED   Final   Special Requests NONE   Final   Culture  Setup Time     Final   Value: 09/17/2013 02:48     Performed at [facility]     Final   Value: >=100,000 COLONIES/ML     Performed at [facility]   Culture     Final   Value: ESCHERICHIA COLI     Performed at [facility]   Report Status 09/18/2013 FINAL   Final   Organism ID, Bacteria ESCHERICHIA COLI   Final  CULTURE, BLOOD (ROUTINE X 2)     Status: None   Collection Time    09/16/13  1:40 PM      Result Value Ref Range Status   Specimen Description BLOOD LEFT WRIST   Final   Special Requests BOTTLES DRAWN  AEROBIC ONLY 3CC   Final   Culture  Setup Time     Final   Value: 09/16/2013 18:45     Performed at [facility]   Culture     Final   Value: NO GROWTH 5 DAYS     Performed at [facility]   Report Status 09/22/2013 FINAL   Final  URINE CULTURE     Status: None   Collection Time    09/23/13  4:11 PM      Result Value Ref Range Status   Colony Count NO GROWTH   Final   Organism ID, Bacteria NO GROWTH   Final  CULTURE, BLOOD (ROUTINE X 2)     Status: None   Collection Time    09/24/13  7:28 AM      Result Value Ref Range Status   Specimen Description BLOOD LEFT ANTECUBITAL   Final   Special Requests BOTTLES DRAWN AEROBIC AND ANAEROBIC 10CCS   Final   Culture  Setup Time     Final   Value: 09/24/2013 12:39     Performed at [facility]   Culture     Final   Value:        BLOOD CULTURE RECEIVED NO GROWTH TO DATE CULTURE WILL BE HELD FOR 5 DAYS BEFORE ISSUING A FINAL NEGATIVE REPORT     Performed at [facility]   Report Status PENDING   Incomplete  CULTURE, BLOOD (ROUTINE X 2)     Status: None   Collection Time    09/24/13  7:34 AM      Result Value Ref Range Status   Specimen Description BLOOD LEFT HAND   Final   Special Requests BOTTLES DRAWN AEROBIC ONLY 5CC   Final   Culture  Setup Time     Final   Value: 09/24/2013 12:39     Performed at [facility]   Culture     Final   Value:        BLOOD CULTURE RECEIVED NO GROWTH TO DATE CULTURE WILL BE HELD FOR 5 DAYS BEFORE ISSUING A FINAL NEGATIVE REPORT     Performed at [facility]   Report Status PENDING   Incomplete  URINE CULTURE     Status: None   Collection Time    09/24/13 11:20 AM      Result Value Ref Range Status   Specimen Description URINE, CATHETERIZED   Final   Special Requests NONE   Final   Culture  Setup Time     Final  Value: 09/24/2013 11:45     Performed at [place]     Final   Value: NO GROWTH     Performed at [facility]   Culture     Final   Value: NO GROWTH     Performed at [facility]   Report Status 09/25/2013 FINAL   Final  GRAM STAIN     Status: None   Collection Time    09/25/13 12:20 PM      Result Value Ref Range Status   Specimen Description CSF   Final   Special Requests 4.5MLS FLUID   Final   Gram Stain     Final   Value: CYTOSPIN PREP     SCANT PMN AND MONONUCLEAR WBC PRESENT     NO ORGANISMS SEEN   Report Status 09/25/2013 FINAL   Final     Studies: Dg Chest 2 View  09/24/2013   CLINICAL DATA:  Fatigue and headache.  EXAM: CHEST  2 VIEW  COMPARISON:  09/16/2013  FINDINGS: Postoperative changes in the mediastinum. Cardiac pacemaker. Borderline heart size with normal pulmonary vascularity. Emphysematous changes and scattered fibrosis in the lungs. No focal airspace disease or consolidation. No blunting of costophrenic angles esophageal hiatal hernia behind the heart. Calcification of the aorta.  IMPRESSION: Emphysematous changes in the lungs. No focal airspace disease. Esophageal hiatal hernia behind the heart   Electronically Signed   By: Lucienne Capers M.D.   On: 09/24/2013 06:52   Dg Lumbar Spine Complete  09/24/2013   CLINICAL DATA:  Lower back pain.  EXAM: LUMBAR SPINE - COMPLETE 4+ VIEW  COMPARISON:  03/22/2012  FINDINGS: Normal alignment. Degenerative changes throughout the lumbar spine with degenerative disc disease at multiple levels. No vertebral compression deformities. No focal bone lesion or bone destruction. Extensive vascular calcifications. Degenerative changes demonstrates some progression since previous study. Otherwise no significant change.  IMPRESSION: Progressive degenerative changes in the lumbar spine. No acute bony abnormality suggested appear   Electronically Signed   By: Lucienne Capers M.D.   On: 09/24/2013 06:54   Ct Head Wo Contrast  09/24/2013   CLINICAL DATA:  Trouble walking.  Fatigue.  Headache.  EXAM: CT HEAD WITHOUT CONTRAST  TECHNIQUE:  Contiguous axial images were obtained from the base of the skull through the vertex without intravenous contrast.  COMPARISON:  None.  FINDINGS: Diffuse prominence of the CSF containing spaces is compatible with generalized cerebral atrophy. Scattered and confluent hypodensity within the periventricular and deep white matter both cerebral hemispheres is most consistent with moderate chronic microvascular ischemic disease. Prominent vascular calcifications present within the carotid siphons and distal vertebral arteries.  There is no acute intracranial hemorrhage or infarct. No mass lesion or midline shift. Gray-white matter differentiation is well maintained. Ventricles are normal in size without evidence of hydrocephalus. CSF containing spaces are within normal limits. No extra-axial fluid collection.  The calvarium is intact.  Orbital soft tissues are within normal limits.  There is partial opacification of the left sphenoid sinus, which appears chronic in nature. Paranasal sinuses are otherwise clear. No mastoid effusion. Paranasal sinuses and mastoid air cells are well pneumatized and free of fluid.  Scalp soft tissues are unremarkable.  IMPRESSION: 1. No acute intracranial process. 2. Atrophy with moderate chronic microvascular ischemic disease. 3. Chronic left sphenoid sinus disease.   Electronically Signed   By: Jeannine Boga M.D.   On: 09/24/2013 05:42   Ct Cervical Spine W  Contrast  09/25/2013   CLINICAL DATA:  Altered mental status.  Myelopathy?  FLUOROSCOPY TIME:  1 min and 32 seconds  PROCEDURE: LUMBAR PUNCTURE FOR CERVICAL LUMBAR AND THORACIC MYELOGRAM  CERVICAL AND LUMBAR AND THORACIC MYELOGRAM  CT CERVICAL MYELOGRAM  CT LUMBAR MYELOGRAM  CT THORACIC MYELOGRAM  After thorough discussion of risks and benefits of the procedure including bleeding, infection, injury to nerves, blood vessels, adjacent structures as well as headache and CSF leak, written and oral informed consent was obtained.  Consent was obtained by Dr. Rolla Flatten. Consent was obtained from the patient's son, as the patient is altered.  Patient was positioned prone on the fluoroscopy table. Local anesthesia was provided with 1% lidocaine without epinephrine after prepped and draped in the usual sterile fashion. Puncture was performed at L2-L3 using a 3 1/2 inch 18 gauge spinal needle via midline approach. Using a single pass through the dura, the needle was placed within the thecal sac, with return of CSF. 10 mL Omnipaque-300 was injected into the thecal sac, with normal opacification of the nerve roots and cauda equina consistent with free flow within the subarachnoid space. The patient was then moved to the trendelenburg position and contrast flowed into the Thoracic and Cervical spine regions.  Per the request of the ordering physician, spinal fluid was sent for analysis. The initial tube contained trace blood, which cleared.  I personally performed the lumbar puncture and administered the intrathecal contrast. I also personally supervised acquisition of the myelogram images.  TECHNIQUE: Contiguous axial images were obtained through the Cervical, Thoracic, and Lumbar spine after the intrathecal infusion of infusion. Coronal and sagittal reconstructions were obtained of the axial image sets.  FINDINGS: CERVICAL THORACIC AND LUMBAR MYELOGRAM FINDINGS:  There was a near-complete block at L4-5. Insufficient contrast could be directed caudal to this to assess the L5-S1 level. Mild stenosis was present at L2-3 and mild stenosis at L3-4.  Patient had difficulty rolling, and in fact could not perform any independent maneuvers. She was rolled onto her back where imaging of thoracic spine demonstrated wide canal patency, with normal-sized cord and conus.  The patient was log rolled back over to her stomach where imaging of the cervical spine revealed normal size cord without stenosis.  CT CERVICAL MYELOGRAM FINDINGS:  The alignment is anatomic  except for trace anterolisthesis at C3-4 related to facet arthropathy. There is no disc protrusion or spinal stenosis. No tonsillar herniation is evident. Minor pannus surrounds the odontoid. No intraspinal mass lesion. Normal cord size throughout. Advanced atherosclerotic change at the carotid bifurcations. No lung apex lesion.  CT LUMBAR MYELOGRAM FINDINGS:  Conus appears normal. There is trace anterolisthesis L4-5 related to facet disease. No worrisome osseous lesion or compression fracture. Mild annular bulging L1-2. Central and leftward protrusion at L2-3 with moderate stenosis. Mild stenosis at L3-4 related to posterior element hypertrophy and central protrusion. Moderate stenosis at L4-5 related to central protrusion, facet and ligamentum flavum hypertrophy, and trace anterolisthesis. Mild annular bulging L5-S1. No paravertebral mass lesion. Extensive aortoiliac calcification. Gallbladder hydrops.  CT THORACIC MYELOGRAM FINDINGS:  There is no thoracic compressive lesion or intraspinal mass. Minor spondylotic ridging and annular bulging can be seen in the lower thoracic region. Of note, there are shallow ventral defects at T6-7, T7-8, T8-9, and T9-10 without areas of cord compression. No thoracic compression deformity is evident. There is no worrisome osseous lesion.  There is a quite large hiatal hernia in the retrocardiac region. Given its size this could represent  an intrathoracic stomach. Small bilateral pleural effusions are present. There is platelike atelectasis incompletely evaluated at the right base. Advanced thoracic atherosclerosis is present. Pacemaker is noted with coronary artery calcification and cardiomegaly.  IMPRESSION: Total myelogram demonstrating no cord compression at any level.  Multilevel lumbar spondylosis, worst at L4-5, related to disc herniation, posterior element hypertrophy, and slight anterolisthesis.  Large hiatal hernia, possible intrathoracic stomach.   Electronically Signed    By: Rolla Flatten M.D.   On: 09/25/2013 15:01   Ct Thoracic Spine W Contrast  09/25/2013   CLINICAL DATA:  Altered mental status.  Myelopathy?  FLUOROSCOPY TIME:  1 min and 32 seconds  PROCEDURE: LUMBAR PUNCTURE FOR CERVICAL LUMBAR AND THORACIC MYELOGRAM  CERVICAL AND LUMBAR AND THORACIC MYELOGRAM  CT CERVICAL MYELOGRAM  CT LUMBAR MYELOGRAM  CT THORACIC MYELOGRAM  After thorough discussion of risks and benefits of the procedure including bleeding, infection, injury to nerves, blood vessels, adjacent structures as well as headache and CSF leak, written and oral informed consent was obtained. Consent was obtained by Dr. Rolla Flatten. Consent was obtained from the patient's son, as the patient is altered.  Patient was positioned prone on the fluoroscopy table. Local anesthesia was provided with 1% lidocaine without epinephrine after prepped and draped in the usual sterile fashion. Puncture was performed at L2-L3 using a 3 1/2 inch 18 gauge spinal needle via midline approach. Using a single pass through the dura, the needle was placed within the thecal sac, with return of CSF. 10 mL Omnipaque-300 was injected into the thecal sac, with normal opacification of the nerve roots and cauda equina consistent with free flow within the subarachnoid space. The patient was then moved to the trendelenburg position and contrast flowed into the Thoracic and Cervical spine regions.  Per the request of the ordering physician, spinal fluid was sent for analysis. The initial tube contained trace blood, which cleared.  I personally performed the lumbar puncture and administered the intrathecal contrast. I also personally supervised acquisition of the myelogram images.  TECHNIQUE: Contiguous axial images were obtained through the Cervical, Thoracic, and Lumbar spine after the intrathecal infusion of infusion. Coronal and sagittal reconstructions were obtained of the axial image sets.  FINDINGS: CERVICAL THORACIC AND LUMBAR MYELOGRAM  FINDINGS:  There was a near-complete block at L4-5. Insufficient contrast could be directed caudal to this to assess the L5-S1 level. Mild stenosis was present at L2-3 and mild stenosis at L3-4.  Patient had difficulty rolling, and in fact could not perform any independent maneuvers. She was rolled onto her back where imaging of thoracic spine demonstrated wide canal patency, with normal-sized cord and conus.  The patient was log rolled back over to her stomach where imaging of the cervical spine revealed normal size cord without stenosis.  CT CERVICAL MYELOGRAM FINDINGS:  The alignment is anatomic except for trace anterolisthesis at C3-4 related to facet arthropathy. There is no disc protrusion or spinal stenosis. No tonsillar herniation is evident. Minor pannus surrounds the odontoid. No intraspinal mass lesion. Normal cord size throughout. Advanced atherosclerotic change at the carotid bifurcations. No lung apex lesion.  CT LUMBAR MYELOGRAM FINDINGS:  Conus appears normal. There is trace anterolisthesis L4-5 related to facet disease. No worrisome osseous lesion or compression fracture. Mild annular bulging L1-2. Central and leftward protrusion at L2-3 with moderate stenosis. Mild stenosis at L3-4 related to posterior element hypertrophy and central protrusion. Moderate stenosis at L4-5 related to central protrusion, facet and ligamentum flavum hypertrophy, and trace anterolisthesis.  Mild annular bulging L5-S1. No paravertebral mass lesion. Extensive aortoiliac calcification. Gallbladder hydrops.  CT THORACIC MYELOGRAM FINDINGS:  There is no thoracic compressive lesion or intraspinal mass. Minor spondylotic ridging and annular bulging can be seen in the lower thoracic region. Of note, there are shallow ventral defects at T6-7, T7-8, T8-9, and T9-10 without areas of cord compression. No thoracic compression deformity is evident. There is no worrisome osseous lesion.  There is a quite large hiatal hernia in the  retrocardiac region. Given its size this could represent an intrathoracic stomach. Small bilateral pleural effusions are present. There is platelike atelectasis incompletely evaluated at the right base. Advanced thoracic atherosclerosis is present. Pacemaker is noted with coronary artery calcification and cardiomegaly.  IMPRESSION: Total myelogram demonstrating no cord compression at any level.  Multilevel lumbar spondylosis, worst at L4-5, related to disc herniation, posterior element hypertrophy, and slight anterolisthesis.  Large hiatal hernia, possible intrathoracic stomach.   Electronically Signed   By: Rolla Flatten M.D.   On: 09/25/2013 15:01   Ct Lumbar Spine W Contrast  09/25/2013   CLINICAL DATA:  Altered mental status.  Myelopathy?  FLUOROSCOPY TIME:  1 min and 32 seconds  PROCEDURE: LUMBAR PUNCTURE FOR CERVICAL LUMBAR AND THORACIC MYELOGRAM  CERVICAL AND LUMBAR AND THORACIC MYELOGRAM  CT CERVICAL MYELOGRAM  CT LUMBAR MYELOGRAM  CT THORACIC MYELOGRAM  After thorough discussion of risks and benefits of the procedure including bleeding, infection, injury to nerves, blood vessels, adjacent structures as well as headache and CSF leak, written and oral informed consent was obtained. Consent was obtained by Dr. Rolla Flatten. Consent was obtained from the patient's son, as the patient is altered.  Patient was positioned prone on the fluoroscopy table. Local anesthesia was provided with 1% lidocaine without epinephrine after prepped and draped in the usual sterile fashion. Puncture was performed at L2-L3 using a 3 1/2 inch 18 gauge spinal needle via midline approach. Using a single pass through the dura, the needle was placed within the thecal sac, with return of CSF. 10 mL Omnipaque-300 was injected into the thecal sac, with normal opacification of the nerve roots and cauda equina consistent with free flow within the subarachnoid space. The patient was then moved to the trendelenburg position and contrast flowed  into the Thoracic and Cervical spine regions.  Per the request of the ordering physician, spinal fluid was sent for analysis. The initial tube contained trace blood, which cleared.  I personally performed the lumbar puncture and administered the intrathecal contrast. I also personally supervised acquisition of the myelogram images.  TECHNIQUE: Contiguous axial images were obtained through the Cervical, Thoracic, and Lumbar spine after the intrathecal infusion of infusion. Coronal and sagittal reconstructions were obtained of the axial image sets.  FINDINGS: CERVICAL THORACIC AND LUMBAR MYELOGRAM FINDINGS:  There was a near-complete block at L4-5. Insufficient contrast could be directed caudal to this to assess the L5-S1 level. Mild stenosis was present at L2-3 and mild stenosis at L3-4.  Patient had difficulty rolling, and in fact could not perform any independent maneuvers. She was rolled onto her back where imaging of thoracic spine demonstrated wide canal patency, with normal-sized cord and conus.  The patient was log rolled back over to her stomach where imaging of the cervical spine revealed normal size cord without stenosis.  CT CERVICAL MYELOGRAM FINDINGS:  The alignment is anatomic except for trace anterolisthesis at C3-4 related to facet arthropathy. There is no disc protrusion or spinal stenosis. No tonsillar herniation is evident.  Minor pannus surrounds the odontoid. No intraspinal mass lesion. Normal cord size throughout. Advanced atherosclerotic change at the carotid bifurcations. No lung apex lesion.  CT LUMBAR MYELOGRAM FINDINGS:  Conus appears normal. There is trace anterolisthesis L4-5 related to facet disease. No worrisome osseous lesion or compression fracture. Mild annular bulging L1-2. Central and leftward protrusion at L2-3 with moderate stenosis. Mild stenosis at L3-4 related to posterior element hypertrophy and central protrusion. Moderate stenosis at L4-5 related to central protrusion, facet  and ligamentum flavum hypertrophy, and trace anterolisthesis. Mild annular bulging L5-S1. No paravertebral mass lesion. Extensive aortoiliac calcification. Gallbladder hydrops.  CT THORACIC MYELOGRAM FINDINGS:  There is no thoracic compressive lesion or intraspinal mass. Minor spondylotic ridging and annular bulging can be seen in the lower thoracic region. Of note, there are shallow ventral defects at T6-7, T7-8, T8-9, and T9-10 without areas of cord compression. No thoracic compression deformity is evident. There is no worrisome osseous lesion.  There is a quite large hiatal hernia in the retrocardiac region. Given its size this could represent an intrathoracic stomach. Small bilateral pleural effusions are present. There is platelike atelectasis incompletely evaluated at the right base. Advanced thoracic atherosclerosis is present. Pacemaker is noted with coronary artery calcification and cardiomegaly.  IMPRESSION: Total myelogram demonstrating no cord compression at any level.  Multilevel lumbar spondylosis, worst at L4-5, related to disc herniation, posterior element hypertrophy, and slight anterolisthesis.  Large hiatal hernia, possible intrathoracic stomach.   Electronically Signed   By: Rolla Fl[facility] M.D.   On: 09/25/2013 15:01   Dg Myelography Lumbar Inj Multi Region  09/25/2013   CLINICAL DATA:  Altered mental status.  Myelopathy?  FLUOROSCOPY TIME:  1 min and 32 seconds  PROCEDURE: LUMBAR PUNCTURE FOR CERVICAL LUMBAR AND THORACIC MYELOGRAM  CERVICAL AND LUMBAR AND THORACIC MYELOGRAM  CT CERVICAL MYELOGRAM  CT LUMBAR MYELOGRAM  CT THORACIC MYELOGRAM  After thorough discussion of risks and benefits of the procedure including bleeding, infection, injury to nerves, blood vessels, adjacent structures as well as headache and CSF leak, written and oral informed consent was obtained. Consent was obtained by Dr. Rolla Flatten. Consent was obtained from the patient's son, as the patient is altered.  Patient was  positioned prone on the fluoroscopy table. Local anesthesia was provided with 1% lidocaine without epinephrine after prepped and draped in the usual sterile fashion. Puncture was performed at L2-L3 using a 3 1/2 inch 18 gauge spinal needle via midline approach. Using a single pass through the dura, the needle was placed within the thecal sac, with return of CSF. 10 mL Omnipaque-300 was injected into the thecal sac, with normal opacification of the nerve roots and cauda equina consistent with free flow within the subarachnoid space. The patient was then moved to the trendelenburg position and contrast flowed into the Thoracic and Cervical spine regions.  Per the request of the ordering physician, spinal fluid was sent for analysis. The initial tube contained trace blood, which cleared.  I personally performed the lumbar puncture and administered the intrathecal contrast. I also personally supervised acquisition of the myelogram images.  TECHNIQUE: Contiguous axial images were obtained through the Cervical, Thoracic, and Lumbar spine after the intrathecal infusion of infusion. Coronal and sagittal reconstructions were obtained of the axial image sets.  FINDINGS: CERVICAL THORACIC AND LUMBAR MYELOGRAM FINDINGS:  There was a near-complete block at L4-5. Insufficient contrast could be directed caudal to this to assess the L5-S1 level. Mild stenosis was present at L2-3 and mild stenosis at  L3-4.  Patient had difficulty rolling, and in fact could not perform any independent maneuvers. She was rolled onto her back where imaging of thoracic spine demonstrated wide canal patency, with normal-sized cord and conus.  The patient was log rolled back over to her stomach where imaging of the cervical spine revealed normal size cord without stenosis.  CT CERVICAL MYELOGRAM FINDINGS:  The alignment is anatomic except for trace anterolisthesis at C3-4 related to facet arthropathy. There is no disc protrusion or spinal stenosis. No  tonsillar herniation is evident. Minor pannus surrounds the odontoid. No intraspinal mass lesion. Normal cord size throughout. Advanced atherosclerotic change at the carotid bifurcations. No lung apex lesion.  CT LUMBAR MYELOGRAM FINDINGS:  Conus appears normal. There is trace anterolisthesis L4-5 related to facet disease. No worrisome osseous lesion or compression fracture. Mild annular bulging L1-2. Central and leftward protrusion at L2-3 with moderate stenosis. Mild stenosis at L3-4 related to posterior element hypertrophy and central protrusion. Moderate stenosis at L4-5 related to central protrusion, facet and ligamentum flavum hypertrophy, and trace anterolisthesis. Mild annular bulging L5-S1. No paravertebral mass lesion. Extensive aortoiliac calcification. Gallbladder hydrops.  CT THORACIC MYELOGRAM FINDINGS:  There is no thoracic compressive lesion or intraspinal mass. Minor spondylotic ridging and annular bulging can be seen in the lower thoracic region. Of note, there are shallow ventral defects at T6-7, T7-8, T8-9, and T9-10 without areas of cord compression. No thoracic compression deformity is evident. There is no worrisome osseous lesion.  There is a quite large hiatal hernia in the retrocardiac region. Given its size this could represent an intrathoracic stomach. Small bilateral pleural effusions are present. There is platelike atelectasis incompletely evaluated at the right base. Advanced thoracic atherosclerosis is present. Pacemaker is noted with coronary artery calcification and cardiomegaly.  IMPRESSION: Total myelogram demonstrating no cord compression at any level.  Multilevel lumbar spondylosis, worst at L4-5, related to disc herniation, posterior element hypertrophy, and slight anterolisthesis.  Large hiatal hernia, possible intrathoracic stomach.   Electronically Signed   By: Rolla Flatten M.D.   On: 09/25/2013 15:01    Scheduled Meds: . aspirin EC  81 mg Oral Daily  . atorvastatin  40  mg Oral q1800  . carvedilol  6.25 mg Oral BID WC  . insulin aspart  0-9 Units Subcutaneous TID WC  . levothyroxine  50 mcg Oral QAC breakfast  . meropenem (MERREM) IV  500 mg Intravenous Q12H  . pantoprazole  40 mg Oral Daily  . sodium chloride  3 mL Intravenous Q12H  . vancomycin  500 mg Intravenous Q24H   Continuous Infusions:    Time spent: > 35 minutes    Velvet Bathe  [facility] Pager [phone] If 7PM-7AM, please contact night-coverage at [email or web address], password [facility] 09/25/2013, 3:32 PM  LOS: 1 day

## 2013-09-25 NOTE — Progress Notes (Signed)
Subjective: Continues to complain of neck paint  Exam: Filed Vitals:   09/25/13 0630  BP: 126/47  Pulse: 72  Temp: 97.9 F (36.6 C)  Resp: 18   Gen: In bed, NAD She has significant neck stiffness and tenderness over her cervical spine.  MS: Awake, alert, interactive and follows commands.  [id number], EOMI Motor: moves all extremities well, but has a possible mild increase in tone in her lower extremities bilaterally.  Sensory:intact to LT   Impression: 79 yo F with progressive difficulty walking and neck/back pain. Given the degree of neck stiffness and unexplained leukocytosis, concern for cervical spine pathology. A Myelogram is planned for today and given the unexplained leukocytosis, will alos add CSF studies given a dural puncture will already be performed.   Recommendations: 1) CT myelogram 2) csf for Cell count with diff, gram stain+culture, protein, glucose, fungal studies   Roland Rack, MD [facility] [phone]  If 7pm- 7am, please page neurology on call as listed in [place].

## 2013-09-25 NOTE — Progress Notes (Signed)
Speech Language Pathology Treatment: Dysphagia  Patient Details Name: Barbara Garner MRN: [id number] DOB: [birth year] Today's Date: 09/25/2013 Time: 3578-9784 SLP Time Calculation (min): 9 min  Assessment / Plan / Recommendation Clinical Impression  Skilled treatment session focused on addressing goals for safe PO intake.  Patient recently back from procedure and reported feeling nauseas so declined many trials but agreeable to a few bites of regular textures and thin liquid sips via straw.  Patient demonstrated functional mastication and oral clearance of boluses as well as no overt pharyngeal s/s of aspiration.  As a result, goals met and no further skilled SLP services are warranted at this time.      HPI HPI: Barbara Garner is a 79 year old female, with history of CAD status post CABG, Chronic diastolic dysfunction EF in 2013 was 60%, Complete heart block status post pacemaker placement, status post aortic valve replacement (bovine), LBBB, HTN, Hypothyroidism, Dyslipidemia, CK D4 baseline creatinine around 1.5, iron deficiency anemia, type 2 diabetes mellitus, GERD, recent hospitalization 6/30 to 09/18/13 for healthcare associated pneumonia and acute on chronic CHF.  Patient brought in by her son for generalized weakness, poor oral intake, accidental medication OD, and started complaining of back pain 09/23/13; was brought to the ED by son 09/23/13, workup inconclusive Neuro was consulted who requested Myelogram and Hospitalist admission.   Pertinent Vitals none  SLP Plan  All goals met;Discharge SLP treatment due to (comment) (goals met)    Recommendations Diet recommendations: Regular;Thin liquid Liquids provided via: Cup;Straw Medication Administration: Whole meds with liquid Supervision: Patient able to self feed Compensations: Slow rate;Small sips/bites Postural Changes and/or Swallow Maneuvers: Seated upright 90 degrees;Upright 30-60 min after meal              Oral Care Recommendations: Oral  care BID Follow up Recommendations: None Plan: All goals met;Discharge SLP treatment due to (comment) (goals met)    GO     Gunnar Fusi, M.A., CCC-SLP [phone]  Varetta Chavers 09/25/2013, 5:12 PM

## 2013-09-25 NOTE — Progress Notes (Signed)
ANTIBIOTIC CONSULT NOTE - Peter for Vancomycin, Meropenem  Indication: Diskitis   Allergies  Allergen Reactions  . Metformin     REACTION: intolerant  . Promethazine Hcl     REACTION: u/k    Patient Measurements: Height: 5' 1.81" (157 cm) Weight: 114 lb 10.2 oz (52 kg) IBW/kg (Calculated) : 49.67 Adjusted Body Weight: n/a  Vital Signs: Temp: 97.9 F (36.6 C) (07/09 0630) Temp src: Oral (07/09 0630) BP: 126/47 mmHg (07/09 0630) Pulse Rate: 72 (07/09 0630) Intake/Output from previous day: 07/08 0701 - 07/09 0700 In: -  Out: 400 [Urine:400] Intake/Output from this shift: Total I/O In: -  Out: 850 [Urine:850]  Labs:  Recent Labs  09/23/13 1515 09/24/13 0425 09/24/13 0429 09/24/13 1120 09/25/13 0453  WBC 18.5 Repeated and verified X2.* 17.1*  --   --  12.7*  HGB 12.4 12.1 13.9  --  10.4*  PLT 176.0 174  --   --  134*  LABCREA  --   --   --  78.46  --   CREATININE 2.4*  --  2.20*  --  1.56*   Estimated Creatinine Clearance: 23.7 ml/min (by C-G formula based on Cr of 1.56). No results found for this basename: VANCOTROUGH, VANCOPEAK, VANCORANDOM, Julian, GENTPEAK, GENTRANDOM, TOBRATROUGH, TOBRAPEAK, TOBRARND, AMIKACINPEAK, AMIKACINTROU, AMIKACIN,  in the last 72 hours   Microbiology: Recent Results (from the past 720 hour(s))  URINE CULTURE     Status: None   Collection Time    09/16/13  1:36 PM      Result Value Ref Range Status   Specimen Description URINE, CATHETERIZED   Final   Special Requests NONE   Final   Culture  Setup Time     Final   Value: 09/17/2013 02:48     Performed at [place]     Final   Value: >=100,000 COLONIES/ML     Performed at [facility]   Culture     Final   Value: ESCHERICHIA COLI     Performed at [facility]   Report Status 09/18/2013 FINAL   Final   Organism ID, Bacteria ESCHERICHIA COLI   Final  CULTURE, BLOOD (ROUTINE X 2)     Status: None   Collection  Time    09/16/13  1:40 PM      Result Value Ref Range Status   Specimen Description BLOOD LEFT WRIST   Final   Special Requests BOTTLES DRAWN AEROBIC ONLY 3CC   Final   Culture  Setup Time     Final   Value: 09/16/2013 18:45     Performed at [facility]   Culture     Final   Value: NO GROWTH 5 DAYS     Performed at [facility]   Report Status 09/22/2013 FINAL   Final  URINE CULTURE     Status: None   Collection Time    09/23/13  4:11 PM      Result Value Ref Range Status   Colony Count NO GROWTH   Final   Organism ID, Bacteria NO GROWTH   Final  CULTURE, BLOOD (ROUTINE X 2)     Status: None   Collection Time    09/24/13  7:28 AM      Result Value Ref Range Status   Specimen Description BLOOD LEFT ANTECUBITAL   Final   Special Requests BOTTLES DRAWN AEROBIC AND ANAEROBIC 10CCS   Final  Culture  Setup Time     Final   Value: 09/24/2013 12:39     Performed at [facility]   Culture     Final   Value:        BLOOD CULTURE RECEIVED NO GROWTH TO DATE CULTURE WILL BE HELD FOR 5 DAYS BEFORE ISSUING A FINAL NEGATIVE REPORT     Performed at [facility]   Report Status PENDING   Incomplete  CULTURE, BLOOD (ROUTINE X 2)     Status: None   Collection Time    09/24/13  7:34 AM      Result Value Ref Range Status   Specimen Description BLOOD LEFT HAND   Final   Special Requests BOTTLES DRAWN AEROBIC ONLY 5CC   Final   Culture  Setup Time     Final   Value: 09/24/2013 12:39     Performed at [facility]   Culture     Final   Value:        BLOOD CULTURE RECEIVED NO GROWTH TO DATE CULTURE WILL BE HELD FOR 5 DAYS BEFORE ISSUING A FINAL NEGATIVE REPORT     Performed at [facility]   Report Status PENDING   Incomplete  URINE CULTURE     Status: None   Collection Time    09/24/13 11:20 AM      Result Value Ref Range Status   Specimen Description URINE, CATHETERIZED   Final   Special Requests NONE   Final   Culture  Setup Time     Final    Value: 09/24/2013 11:45     Performed at [place]     Final   Value: NO GROWTH     Performed at [facility]   Culture     Final   Value: NO GROWTH     Performed at [facility]   Report Status 09/25/2013 FINAL   Final    Assessment: 19 YOF with back pain rules as diskitis started on meropenem and vancomycin. WBC remains elevated but has trended down. Pt is afebrile. SCr has improved to 1.56. CrCl now ~ 20-25 mL/min   7/8 Blood Cx x2>> 7/8 Urine Cx>>   Goal of Therapy:  Vancomycin trough level 15-20 mcg/ml  Plan:  1) Change vancomycin to 500 mg IV Q 24 hours  2) Meropenem 500 mg IV Q 12 hours 3) Monitor CBC, renal fx, cultures and patient's clinical progress 4) Vancomycin trough at [facility]  5) F/u myelogram results   Albertina Parr, PharmD.  Clinical Pharmacist Pager [phone]     Lavenia Atlas 09/25/2013,12:37 PM

## 2013-09-26 ENCOUNTER — Encounter: Payer: Medicare Other | Admitting: Nurse Practitioner

## 2013-09-26 LAB — GLUCOSE, CAPILLARY
GLUCOSE-CAPILLARY: 91 mg/dL (ref 70–99)
GLUCOSE-CAPILLARY: 150 mg/dL — AB (ref 70–99)
Glucose-Capillary: 198 mg/dL — ABNORMAL HIGH (ref 70–99)
Glucose-Capillary: 182 mg/dL — ABNORMAL HIGH (ref 70–99)

## 2013-09-26 MED ORDER — HYDROCODONE-ACETAMINOPHEN 5-325 MG PO TABS: 1.0000 | ORAL_TABLET | ORAL | Status: DC | PRN

## 2013-09-26 NOTE — Discharge Summary (Addendum)
Physician Discharge Summary  Barbara Garner [id number] DOB: 10-Jun-1934 DOA: 09/24/2013  PCP: Loura Pardon, MD  Admit date: 09/24/2013 Discharge date: 09/26/2013  Time spent: > 35 minutes  Recommendations for Outpatient Follow-up:  1. Please continue to monitor and decide whether or not to prolong PT services 2. Pan culture negative including CSF  Discharge Diagnoses:  Please see list listed below  Discharge Condition: stable  Diet recommendation: carb modified diet.  Filed Weights   09/24/13 1141 09/25/13 0630 09/26/13 0600  Weight: 51.8 kg (114 lb 3.2 oz) 52 kg (114 lb 10.2 oz) 51.7 kg (113 lb 15.7 oz)    History of present illness:  From original HPI: 79 y.o. female, with history of CAD status post CABG, Chronic diastolic dysfunction EF in 2013 was 60%, Complete heart block status post pacemaker placement, status post aortic valve replacement (bovine), LBBB, HTN, Hypothyroidism, Dyslipidemia, CK D4 baseline creatinine around 1.5, iron deficiency anemia, type 2 diabetes mellitus, GERD, recent hospitalization for pneumonia, who is brought in by her son for generalized weakness, poor oral intake , accidental medication OD yesterday AM, last night started complaining of ?? Back pain, was brought to the ER by son, workup inconclusive Neuro was consulted who requested Myelogram and Hospitalist admission, patient is somnolent, unreliable historian at this time, history through her 2 sons   Hospital Course:  Back pain - Most likely due to herniated disk. No cord compression on myelogram - Will provide pain medication for relief - Neurology evaluated and recommended the following: Recommendations:  1) Would consider PT  2) No further neurodiagnostic studies at this time, neurology to sign off. Please call with any remaining questions.  PT evaluation recommended skilled nursing facility or home health PT if supervision/assistance available 24 hours. Per my discussion with nursing and per  documented EMR notes patient has 24-hour supervision. Also recommended rolling walker with 5 inch wheels of which orders were placed  Leukocytosis - Pan culture negative including CSF - Imaging studies reported herniated disc - No fevers, as such leukocytosis most likely secondary to dehydration stress reaction  Her chronic medical conditions we'll continue home regimen  Procedures:  As listed below  Consultations:  Neurology  Discharge Exam: Filed Vitals:   09/26/13 1419  BP: 119/50  Pulse: 75  Temp: 98 F (36.7 C)  Resp: 18    General: Patient in no acute distress, alert and awake, sitting up smiling eating Cardiovascular: S1 and S2 present, no murmurs rubs Respiratory: Clear to auscultation bilaterally no wheezes  Discharge Instructions You were cared for by a hospitalist during your hospital stay. If you have any questions about your discharge medications or the care you received while you were in the hospital after you are discharged, you can call the unit and asked to speak with the hospitalist on call if the hospitalist that took care of you is not available. Once you are discharged, your primary care physician will handle any further medical issues. Please note that NO REFILLS for any discharge medications will be authorized once you are discharged, as it is imperative that you return to your primary care physician (or establish a relationship with a primary care physician if you do not have one) for your aftercare needs so that they can reassess your need for medications and monitor your lab values.  Discharge Instructions   Call MD for:  severe uncontrolled pain    Complete by:  As directed      Call MD for:  temperature >100.4  Complete by:  As directed      Diet - low sodium heart healthy    Complete by:  As directed      Discharge instructions    Complete by:  As directed   Home with home health PT and 24 hr supervision which patient states that she has. Also  with Rolling walking with 5 inch wheels.     Increase activity slowly    Complete by:  As directed             Medication List         aspirin 81 MG EC tablet  Take 1 tablet (81 mg total) by mouth daily.     atorvastatin 40 MG tablet  Commonly known as:  LIPITOR  Take 40 mg by mouth daily.     carvedilol 12.5 MG tablet  Commonly known as:  COREG  Take 12.5 mg by mouth 2 (two) times daily with a meal.     fluticasone 50 MCG/ACT nasal spray  Commonly known as:  FLONASE  Place 2 sprays into both nostrils daily.     furosemide 40 MG tablet  Commonly known as:  LASIX  Take 1 tablet (40 mg total) by mouth daily.     glipiZIDE 10 MG 24 hr tablet  Commonly known as:  GLUCOTROL XL  Take 10 mg by mouth 2 (two) times daily.     HYDROcodone-acetaminophen 5-325 MG per tablet  Commonly known as:  NORCO/VICODIN  Take 1 tablet by mouth every 4 (four) hours as needed for moderate pain.     levothyroxine 50 MCG tablet  Commonly known as:  SYNTHROID, LEVOTHROID  Take 50 mcg by mouth daily before breakfast.     omeprazole 20 MG capsule  Commonly known as:  PRILOSEC  Take 20 mg by mouth daily.     OVER THE COUNTER MEDICATION  Apply 1 application topically daily as needed (for stiffness in neck). "Muscle Reliever cream"     Potassium Chloride ER 20 MEQ Tbcr  Take 20 mEq by mouth daily. Take when taking lasix.     Vitamin D-3 1000 UNITS Caps  Take 1 capsule by mouth daily.       Allergies  Allergen Reactions  . Metformin     REACTION: intolerant  . Promethazine Hcl     REACTION: u/k      The results of significant diagnostics from this hospitalization (including imaging, microbiology, ancillary and laboratory) are listed below for reference.    Significant Diagnostic Studies: Dg Chest 2 View  09/24/2013   CLINICAL DATA:  Fatigue and headache.  EXAM: CHEST  2 VIEW  COMPARISON:  09/16/2013  FINDINGS: Postoperative changes in the mediastinum. Cardiac pacemaker. Borderline  heart size with normal pulmonary vascularity. Emphysematous changes and scattered fibrosis in the lungs. No focal airspace disease or consolidation. No blunting of costophrenic angles esophageal hiatal hernia behind the heart. Calcification of the aorta.  IMPRESSION: Emphysematous changes in the lungs. No focal airspace disease. Esophageal hiatal hernia behind the heart   Electronically Signed   By: Lucienne Capers M.D.   On: 09/24/2013 06:52   Dg Lumbar Spine Complete  09/24/2013   CLINICAL DATA:  Lower back pain.  EXAM: LUMBAR SPINE - COMPLETE 4+ VIEW  COMPARISON:  03/22/2012  FINDINGS: Normal alignment. Degenerative changes throughout the lumbar spine with degenerative disc disease at multiple levels. No vertebral compression deformities. No focal bone lesion or bone destruction. Extensive vascular calcifications. Degenerative changes demonstrates some progression  since previous study. Otherwise no significant change.  IMPRESSION: Progressive degenerative changes in the lumbar spine. No acute bony abnormality suggested appear   Electronically Signed   By: Lucienne Capers M.D.   On: 09/24/2013 06:54   Ct Head Wo Contrast  09/24/2013   CLINICAL DATA:  Trouble walking.  Fatigue.  Headache.  EXAM: CT HEAD WITHOUT CONTRAST  TECHNIQUE: Contiguous axial images were obtained from the base of the skull through the vertex without intravenous contrast.  COMPARISON:  None.  FINDINGS: Diffuse prominence of the CSF containing spaces is compatible with generalized cerebral atrophy. Scattered and confluent hypodensity within the periventricular and deep white matter both cerebral hemispheres is most consistent with moderate chronic microvascular ischemic disease. Prominent vascular calcifications present within the carotid siphons and distal vertebral arteries.  There is no acute intracranial hemorrhage or infarct. No mass lesion or midline shift. Gray-white matter differentiation is well maintained. Ventricles are normal  in size without evidence of hydrocephalus. CSF containing spaces are within normal limits. No extra-axial fluid collection.  The calvarium is intact.  Orbital soft tissues are within normal limits.  There is partial opacification of the left sphenoid sinus, which appears chronic in nature. Paranasal sinuses are otherwise clear. No mastoid effusion. Paranasal sinuses and mastoid air cells are well pneumatized and free of fluid.  Scalp soft tissues are unremarkable.  IMPRESSION: 1. No acute intracranial process. 2. Atrophy with moderate chronic microvascular ischemic disease. 3. Chronic left sphenoid sinus disease.   Electronically Signed   By: Jeannine Boga M.D.   On: 09/24/2013 05:42   Ct Chest Wo Contrast  09/16/2013   CLINICAL DATA:  Three-day history of shortness of breath  EXAM: CT CHEST WITHOUT CONTRAST  TECHNIQUE: Multidetector CT imaging of the chest was performed following the standard protocol without IV contrast.  COMPARISON:  Portable chest x-ray of September 16, 2013  FINDINGS: There are small bilateral pleural effusions layering posteriorly. There is basilar atelectasis versus pneumonia on the right with minimal atelectasis at the left lung base. There is a large hiatal hernia -partially intra thoracic stomach. The cardiac chambers are normal in size. The patient has undergone previous CABG and permanent pacemaker placement. There is no pericardial effusion. The caliber of the thoracic aorta is normal.  The thoracic vertebral bodies are preserved in height. There is diffuse narrowing of the disc spaces. The observed portions of the rib cage exhibit no acute abnormalities.  Within the upper abdomen the observed portions of the liver and spleen are unremarkable.  IMPRESSION: 1. There are small bilateral pleural effusions with bibasilar atelectasis and right basilar pneumonia. There is no evidence of pulmonary edema. 2. There is a large hiatal hernia-partially intra thoracic stomach.   Electronically  Signed   By: David  Martinique   On: 09/16/2013 13:19   Ct Cervical Spine W Contrast  09/25/2013   CLINICAL DATA:  Altered mental status.  Myelopathy?  FLUOROSCOPY TIME:  1 min and 32 seconds  PROCEDURE: LUMBAR PUNCTURE FOR CERVICAL LUMBAR AND THORACIC MYELOGRAM  CERVICAL AND LUMBAR AND THORACIC MYELOGRAM  CT CERVICAL MYELOGRAM  CT LUMBAR MYELOGRAM  CT THORACIC MYELOGRAM  After thorough discussion of risks and benefits of the procedure including bleeding, infection, injury to nerves, blood vessels, adjacent structures as well as headache and CSF leak, written and oral informed consent was obtained. Consent was obtained by Dr. Rolla Flatten. Consent was obtained from the patient's son, as the patient is altered.  Patient was positioned prone on the fluoroscopy  table. Local anesthesia was provided with 1% lidocaine without epinephrine after prepped and draped in the usual sterile fashion. Puncture was performed at L2-L3 using a 3 1/2 inch 18 gauge spinal needle via midline approach. Using a single pass through the dura, the needle was placed within the thecal sac, with return of CSF. 10 mL Omnipaque-300 was injected into the thecal sac, with normal opacification of the nerve roots and cauda equina consistent with free flow within the subarachnoid space. The patient was then moved to the trendelenburg position and contrast flowed into the Thoracic and Cervical spine regions.  Per the request of the ordering physician, spinal fluid was sent for analysis. The initial tube contained trace blood, which cleared.  I personally performed the lumbar puncture and administered the intrathecal contrast. I also personally supervised acquisition of the myelogram images.  TECHNIQUE: Contiguous axial images were obtained through the Cervical, Thoracic, and Lumbar spine after the intrathecal infusion of infusion. Coronal and sagittal reconstructions were obtained of the axial image sets.  FINDINGS: CERVICAL THORACIC AND LUMBAR MYELOGRAM  FINDINGS:  There was a near-complete block at L4-5. Insufficient contrast could be directed caudal to this to assess the L5-S1 level. Mild stenosis was present at L2-3 and mild stenosis at L3-4.  Patient had difficulty rolling, and in fact could not perform any independent maneuvers. She was rolled onto her back where imaging of thoracic spine demonstrated wide canal patency, with normal-sized cord and conus.  The patient was log rolled back over to her stomach where imaging of the cervical spine revealed normal size cord without stenosis.  CT CERVICAL MYELOGRAM FINDINGS:  The alignment is anatomic except for trace anterolisthesis at C3-4 related to facet arthropathy. There is no disc protrusion or spinal stenosis. No tonsillar herniation is evident. Minor pannus surrounds the odontoid. No intraspinal mass lesion. Normal cord size throughout. Advanced atherosclerotic change at the carotid bifurcations. No lung apex lesion.  CT LUMBAR MYELOGRAM FINDINGS:  Conus appears normal. There is trace anterolisthesis L4-5 related to facet disease. No worrisome osseous lesion or compression fracture. Mild annular bulging L1-2. Central and leftward protrusion at L2-3 with moderate stenosis. Mild stenosis at L3-4 related to posterior element hypertrophy and central protrusion. Moderate stenosis at L4-5 related to central protrusion, facet and ligamentum flavum hypertrophy, and trace anterolisthesis. Mild annular bulging L5-S1. No paravertebral mass lesion. Extensive aortoiliac calcification. Gallbladder hydrops.  CT THORACIC MYELOGRAM FINDINGS:  There is no thoracic compressive lesion or intraspinal mass. Minor spondylotic ridging and annular bulging can be seen in the lower thoracic region. Of note, there are shallow ventral defects at T6-7, T7-8, T8-9, and T9-10 without areas of cord compression. No thoracic compression deformity is evident. There is no worrisome osseous lesion.  There is a quite large hiatal hernia in the  retrocardiac region. Given its size this could represent an intrathoracic stomach. Small bilateral pleural effusions are present. There is platelike atelectasis incompletely evaluated at the right base. Advanced thoracic atherosclerosis is present. Pacemaker is noted with coronary artery calcification and cardiomegaly.  IMPRESSION: Total myelogram demonstrating no cord compression at any level.  Multilevel lumbar spondylosis, worst at L4-5, related to disc herniation, posterior element hypertrophy, and slight anterolisthesis.  Large hiatal hernia, possible intrathoracic stomach.   Electronically Signed   By: Rolla Flatten M.D.   On: 09/25/2013 15:01   Ct Thoracic Spine W Contrast  09/25/2013   CLINICAL DATA:  Altered mental status.  Myelopathy?  FLUOROSCOPY TIME:  1 min and 32 seconds  PROCEDURE: LUMBAR PUNCTURE FOR CERVICAL LUMBAR AND THORACIC MYELOGRAM  CERVICAL AND LUMBAR AND THORACIC MYELOGRAM  CT CERVICAL MYELOGRAM  CT LUMBAR MYELOGRAM  CT THORACIC MYELOGRAM  After thorough discussion of risks and benefits of the procedure including bleeding, infection, injury to nerves, blood vessels, adjacent structures as well as headache and CSF leak, written and oral informed consent was obtained. Consent was obtained by Dr. Rolla Flatten. Consent was obtained from the patient's son, as the patient is altered.  Patient was positioned prone on the fluoroscopy table. Local anesthesia was provided with 1% lidocaine without epinephrine after prepped and draped in the usual sterile fashion. Puncture was performed at L2-L3 using a 3 1/2 inch 18 gauge spinal needle via midline approach. Using a single pass through the dura, the needle was placed within the thecal sac, with return of CSF. 10 mL Omnipaque-300 was injected into the thecal sac, with normal opacification of the nerve roots and cauda equina consistent with free flow within the subarachnoid space. The patient was then moved to the trendelenburg position and contrast  flowed into the Thoracic and Cervical spine regions.  Per the request of the ordering physician, spinal fluid was sent for analysis. The initial tube contained trace blood, which cleared.  I personally performed the lumbar puncture and administered the intrathecal contrast. I also personally supervised acquisition of the myelogram images.  TECHNIQUE: Contiguous axial images were obtained through the Cervical, Thoracic, and Lumbar spine after the intrathecal infusion of infusion. Coronal and sagittal reconstructions were obtained of the axial image sets.  FINDINGS: CERVICAL THORACIC AND LUMBAR MYELOGRAM FINDINGS:  There was a near-complete block at L4-5. Insufficient contrast could be directed caudal to this to assess the L5-S1 level. Mild stenosis was present at L2-3 and mild stenosis at L3-4.  Patient had difficulty rolling, and in fact could not perform any independent maneuvers. She was rolled onto her back where imaging of thoracic spine demonstrated wide canal patency, with normal-sized cord and conus.  The patient was log rolled back over to her stomach where imaging of the cervical spine revealed normal size cord without stenosis.  CT CERVICAL MYELOGRAM FINDINGS:  The alignment is anatomic except for trace anterolisthesis at C3-4 related to facet arthropathy. There is no disc protrusion or spinal stenosis. No tonsillar herniation is evident. Minor pannus surrounds the odontoid. No intraspinal mass lesion. Normal cord size throughout. Advanced atherosclerotic change at the carotid bifurcations. No lung apex lesion.  CT LUMBAR MYELOGRAM FINDINGS:  Conus appears normal. There is trace anterolisthesis L4-5 related to facet disease. No worrisome osseous lesion or compression fracture. Mild annular bulging L1-2. Central and leftward protrusion at L2-3 with moderate stenosis. Mild stenosis at L3-4 related to posterior element hypertrophy and central protrusion. Moderate stenosis at L4-5 related to central  protrusion, facet and ligamentum flavum hypertrophy, and trace anterolisthesis. Mild annular bulging L5-S1. No paravertebral mass lesion. Extensive aortoiliac calcification. Gallbladder hydrops.  CT THORACIC MYELOGRAM FINDINGS:  There is no thoracic compressive lesion or intraspinal mass. Minor spondylotic ridging and annular bulging can be seen in the lower thoracic region. Of note, there are shallow ventral defects at T6-7, T7-8, T8-9, and T9-10 without areas of cord compression. No thoracic compression deformity is evident. There is no worrisome osseous lesion.  There is a quite large hiatal hernia in the retrocardiac region. Given its size this could represent an intrathoracic stomach. Small bilateral pleural effusions are present. There is platelike atelectasis incompletely evaluated at the right base. Advanced thoracic atherosclerosis is  present. Pacemaker is noted with coronary artery calcification and cardiomegaly.  IMPRESSION: Total myelogram demonstrating no cord compression at any level.  Multilevel lumbar spondylosis, worst at L4-5, related to disc herniation, posterior element hypertrophy, and slight anterolisthesis.  Large hiatal hernia, possible intrathoracic stomach.   Electronically Signed   By: Rolla Flatten M.D.   On: 09/25/2013 15:01   Ct Lumbar Spine W Contrast  09/25/2013   CLINICAL DATA:  Altered mental status.  Myelopathy?  FLUOROSCOPY TIME:  1 min and 32 seconds  PROCEDURE: LUMBAR PUNCTURE FOR CERVICAL LUMBAR AND THORACIC MYELOGRAM  CERVICAL AND LUMBAR AND THORACIC MYELOGRAM  CT CERVICAL MYELOGRAM  CT LUMBAR MYELOGRAM  CT THORACIC MYELOGRAM  After thorough discussion of risks and benefits of the procedure including bleeding, infection, injury to nerves, blood vessels, adjacent structures as well as headache and CSF leak, written and oral informed consent was obtained. Consent was obtained by Dr. Rolla Flatten. Consent was obtained from the patient's son, as the patient is altered.  Patient  was positioned prone on the fluoroscopy table. Local anesthesia was provided with 1% lidocaine without epinephrine after prepped and draped in the usual sterile fashion. Puncture was performed at L2-L3 using a 3 1/2 inch 18 gauge spinal needle via midline approach. Using a single pass through the dura, the needle was placed within the thecal sac, with return of CSF. 10 mL Omnipaque-300 was injected into the thecal sac, with normal opacification of the nerve roots and cauda equina consistent with free flow within the subarachnoid space. The patient was then moved to the trendelenburg position and contrast flowed into the Thoracic and Cervical spine regions.  Per the request of the ordering physician, spinal fluid was sent for analysis. The initial tube contained trace blood, which cleared.  I personally performed the lumbar puncture and administered the intrathecal contrast. I also personally supervised acquisition of the myelogram images.  TECHNIQUE: Contiguous axial images were obtained through the Cervical, Thoracic, and Lumbar spine after the intrathecal infusion of infusion. Coronal and sagittal reconstructions were obtained of the axial image sets.  FINDINGS: CERVICAL THORACIC AND LUMBAR MYELOGRAM FINDINGS:  There was a near-complete block at L4-5. Insufficient contrast could be directed caudal to this to assess the L5-S1 level. Mild stenosis was present at L2-3 and mild stenosis at L3-4.  Patient had difficulty rolling, and in fact could not perform any independent maneuvers. She was rolled onto her back where imaging of thoracic spine demonstrated wide canal patency, with normal-sized cord and conus.  The patient was log rolled back over to her stomach where imaging of the cervical spine revealed normal size cord without stenosis.  CT CERVICAL MYELOGRAM FINDINGS:  The alignment is anatomic except for trace anterolisthesis at C3-4 related to facet arthropathy. There is no disc protrusion or spinal stenosis. No  tonsillar herniation is evident. Minor pannus surrounds the odontoid. No intraspinal mass lesion. Normal cord size throughout. Advanced atherosclerotic change at the carotid bifurcations. No lung apex lesion.  CT LUMBAR MYELOGRAM FINDINGS:  Conus appears normal. There is trace anterolisthesis L4-5 related to facet disease. No worrisome osseous lesion or compression fracture. Mild annular bulging L1-2. Central and leftward protrusion at L2-3 with moderate stenosis. Mild stenosis at L3-4 related to posterior element hypertrophy and central protrusion. Moderate stenosis at L4-5 related to central protrusion, facet and ligamentum flavum hypertrophy, and trace anterolisthesis. Mild annular bulging L5-S1. No paravertebral mass lesion. Extensive aortoiliac calcification. Gallbladder hydrops.  CT THORACIC MYELOGRAM FINDINGS:  There is no thoracic  compressive lesion or intraspinal mass. Minor spondylotic ridging and annular bulging can be seen in the lower thoracic region. Of note, there are shallow ventral defects at T6-7, T7-8, T8-9, and T9-10 without areas of cord compression. No thoracic compression deformity is evident. There is no worrisome osseous lesion.  There is a quite large hiatal hernia in the retrocardiac region. Given its size this could represent an intrathoracic stomach. Small bilateral pleural effusions are present. There is platelike atelectasis incompletely evaluated at the right base. Advanced thoracic atherosclerosis is present. Pacemaker is noted with coronary artery calcification and cardiomegaly.  IMPRESSION: Total myelogram demonstrating no cord compression at any level.  Multilevel lumbar spondylosis, worst at L4-5, related to disc herniation, posterior element hypertrophy, and slight anterolisthesis.  Large hiatal hernia, possible intrathoracic stomach.   Electronically Signed   By: Rolla Flatten M.D.   On: 09/25/2013 15:01   Dg Chest Port 1 View  09/16/2013   CLINICAL DATA:  Shortness of Breath   EXAM: PORTABLE CHEST - 1 VIEW  COMPARISON:  08/09/2013  FINDINGS: Central mild vascular congestion. Cardiomegaly again noted. Dual lead cardiac pacemaker is unchanged in position. Mild perihilar interstitial prominence without convincing pulmonary edema. Bilateral small pleural effusion with bilateral basilar atelectasis or infiltrate.  IMPRESSION: Cardiomegaly. Central vascular congestion without convincing pulmonary edema. Bilateral small pleural effusion with bilateral basilar atelectasis or infiltrate.   Electronically Signed   By: Lahoma Crocker M.D.   On: 09/16/2013 12:07   Dg Myelography Lumbar Inj Multi Region  09/25/2013   CLINICAL DATA:  Altered mental status.  Myelopathy?  FLUOROSCOPY TIME:  1 min and 32 seconds  PROCEDURE: LUMBAR PUNCTURE FOR CERVICAL LUMBAR AND THORACIC MYELOGRAM  CERVICAL AND LUMBAR AND THORACIC MYELOGRAM  CT CERVICAL MYELOGRAM  CT LUMBAR MYELOGRAM  CT THORACIC MYELOGRAM  After thorough discussion of risks and benefits of the procedure including bleeding, infection, injury to nerves, blood vessels, adjacent structures as well as headache and CSF leak, written and oral informed consent was obtained. Consent was obtained by Dr. Rolla Flatten. Consent was obtained from the patient's son, as the patient is altered.  Patient was positioned prone on the fluoroscopy table. Local anesthesia was provided with 1% lidocaine without epinephrine after prepped and draped in the usual sterile fashion. Puncture was performed at L2-L3 using a 3 1/2 inch 18 gauge spinal needle via midline approach. Using a single pass through the dura, the needle was placed within the thecal sac, with return of CSF. 10 mL Omnipaque-300 was injected into the thecal sac, with normal opacification of the nerve roots and cauda equina consistent with free flow within the subarachnoid space. The patient was then moved to the trendelenburg position and contrast flowed into the Thoracic and Cervical spine regions.  Per the request  of the ordering physician, spinal fluid was sent for analysis. The initial tube contained trace blood, which cleared.  I personally performed the lumbar puncture and administered the intrathecal contrast. I also personally supervised acquisition of the myelogram images.  TECHNIQUE: Contiguous axial images were obtained through the Cervical, Thoracic, and Lumbar spine after the intrathecal infusion of infusion. Coronal and sagittal reconstructions were obtained of the axial image sets.  FINDINGS: CERVICAL THORACIC AND LUMBAR MYELOGRAM FINDINGS:  There was a near-complete block at L4-5. Insufficient contrast could be directed caudal to this to assess the L5-S1 level. Mild stenosis was present at L2-3 and mild stenosis at L3-4.  Patient had difficulty rolling, and in fact could not perform any independent  maneuvers. She was rolled onto her back where imaging of thoracic spine demonstrated wide canal patency, with normal-sized cord and conus.  The patient was log rolled back over to her stomach where imaging of the cervical spine revealed normal size cord without stenosis.  CT CERVICAL MYELOGRAM FINDINGS:  The alignment is anatomic except for trace anterolisthesis at C3-4 related to facet arthropathy. There is no disc protrusion or spinal stenosis. No tonsillar herniation is evident. Minor pannus surrounds the odontoid. No intraspinal mass lesion. Normal cord size throughout. Advanced atherosclerotic change at the carotid bifurcations. No lung apex lesion.  CT LUMBAR MYELOGRAM FINDINGS:  Conus appears normal. There is trace anterolisthesis L4-5 related to facet disease. No worrisome osseous lesion or compression fracture. Mild annular bulging L1-2. Central and leftward protrusion at L2-3 with moderate stenosis. Mild stenosis at L3-4 related to posterior element hypertrophy and central protrusion. Moderate stenosis at L4-5 related to central protrusion, facet and ligamentum flavum hypertrophy, and trace anterolisthesis.  Mild annular bulging L5-S1. No paravertebral mass lesion. Extensive aortoiliac calcification. Gallbladder hydrops.  CT THORACIC MYELOGRAM FINDINGS:  There is no thoracic compressive lesion or intraspinal mass. Minor spondylotic ridging and annular bulging can be seen in the lower thoracic region. Of note, there are shallow ventral defects at T6-7, T7-8, T8-9, and T9-10 without areas of cord compression. No thoracic compression deformity is evident. There is no worrisome osseous lesion.  There is a quite large hiatal hernia in the retrocardiac region. Given its size this could represent an intrathoracic stomach. Small bilateral pleural effusions are present. There is platelike atelectasis incompletely evaluated at the right base. Advanced thoracic atherosclerosis is present. Pacemaker is noted with coronary artery calcification and cardiomegaly.  IMPRESSION: Total myelogram demonstrating no cord compression at any level.  Multilevel lumbar spondylosis, worst at L4-5, related to disc herniation, posterior element hypertrophy, and slight anterolisthesis.  Large hiatal hernia, possible intrathoracic stomach.   Electronically Signed   By: Rolla Flatten M.D.   On: 09/25/2013 15:01    Microbiology: Recent Results (from the past 240 hour(s))  URINE CULTURE     Status: None   Collection Time    09/23/13  4:11 PM      Result Value Ref Range Status   Colony Count NO GROWTH   Final   Organism ID, Bacteria NO GROWTH   Final  CULTURE, BLOOD (ROUTINE X 2)     Status: None   Collection Time    09/24/13  7:28 AM      Result Value Ref Range Status   Specimen Description BLOOD LEFT ANTECUBITAL   Final   Special Requests BOTTLES DRAWN AEROBIC AND ANAEROBIC 10CCS   Final   Culture  Setup Time     Final   Value: 09/24/2013 12:39     Performed at [facility]   Culture     Final   Value:        BLOOD CULTURE RECEIVED NO GROWTH TO DATE CULTURE WILL BE HELD FOR 5 DAYS BEFORE ISSUING A FINAL NEGATIVE REPORT      Performed at [facility]   Report Status PENDING   Incomplete  CULTURE, BLOOD (ROUTINE X 2)     Status: None   Collection Time    09/24/13  7:34 AM      Result Value Ref Range Status   Specimen Description BLOOD LEFT HAND   Final   Special Requests BOTTLES DRAWN AEROBIC ONLY 5CC   Final   Culture  Setup Time  Final   Value: 09/24/2013 12:39     Performed at [facility]   Culture     Final   Value:        BLOOD CULTURE RECEIVED NO GROWTH TO DATE CULTURE WILL BE HELD FOR 5 DAYS BEFORE ISSUING A FINAL NEGATIVE REPORT     Performed at [facility]   Report Status PENDING   Incomplete  URINE CULTURE     Status: None   Collection Time    09/24/13 11:20 AM      Result Value Ref Range Status   Specimen Description URINE, CATHETERIZED   Final   Special Requests NONE   Final   Culture  Setup Time     Final   Value: 09/24/2013 11:45     Performed at [facility] Count     Final   Value: NO GROWTH     Performed at [facility]   Culture     Final   Value: NO GROWTH     Performed at [facility]   Report Status 09/25/2013 FINAL   Final  CSF CULTURE     Status: None   Collection Time    09/25/13 12:20 PM      Result Value Ref Range Status   Specimen Description CSF   Final   Special Requests 4.5ML CSF FLUID   Final   Gram Stain     Final   Value: WBC PRESENT,BOTH PMN AND MONONUCLEAR     NO ORGANISMS SEEN     Performed at [facility] CYTOSPIN     Performed at [facility]   Culture     Final   Value: NO GROWTH 1 DAY     Performed at [facility]   Report Status PENDING   Incomplete  FUNGUS CULTURE W SMEAR     Status: None   Collection Time    09/25/13 12:20 PM      Result Value Ref Range Status   Specimen Description CSF   Final   Special Requests 3.0ML CSF FLUID   Final   Fungal Smear     Final   Value: NO YEAST OR FUNGAL ELEMENTS SEEN     Performed at [facility]   Culture      Final   Value: CULTURE IN PROGRESS FOR FOUR WEEKS     Performed at [facility]   Report Status PENDING   Incomplete  GRAM STAIN     Status: None   Collection Time    09/25/13 12:20 PM      Result Value Ref Range Status   Specimen Description CSF   Final   Special Requests 4.5MLS FLUID   Final   Gram Stain     Final   Value: CYTOSPIN PREP     SCANT PMN AND MONONUCLEAR WBC PRESENT     NO ORGANISMS SEEN   Report Status 09/25/2013 FINAL   Final     Labs: Basic Metabolic Panel:  Recent Labs Lab 09/23/13 1515 09/24/13 0429 09/25/13 0453  NA 136 136* 139  K 4.6 3.2* 4.0  CL 94* 95* 101  CO2 28  --  24  GLUCOSE 152* 82 123*  BUN 48* 41* 35*  CREATININE 2.4* 2.20* 1.56*  CALCIUM 8.3*  --  7.5*   Liver Function Tests:  Recent Labs Lab 09/23/13 1515  AST 23  ALT 12  ALKPHOS 84  BILITOT 0.9  PROT 8.2  ALBUMIN 3.6    Recent Labs Lab 09/24/13 1412  LIPASE 18   No results found for this basename: AMMONIA,  in the last 168 hours CBC:  Recent Labs Lab 09/23/13 1515 09/24/13 0425 09/24/13 0429 09/25/13 0453  WBC 18.5 Repeated and verified X2.* 17.1*  --  12.7*  NEUTROABS 15.2* 13.4*  --   --   HGB 12.4 12.1 13.9 10.4*  HCT 37.7 36.0 41.0 32.2*  MCV 92.0 89.8  --  92.8  PLT 176.0 174  --  134*   Cardiac Enzymes: No results found for this basename: CKTOTAL, CKMB, CKMBINDEX, TROPONINI,  in the last 168 hours BNP: BNP (last 3 results)  Recent Labs  08/09/13 0850 09/16/13 1120  PROBNP 7547.0* 11854.0*   CBG:  Recent Labs Lab 09/25/13 1418 09/25/13 1637 09/25/13 2126 09/26/13 0738 09/26/13 1141  GLUCAP 153* 151* 198* 91 182*       Signed:  Hila Bolding  [facility] 09/26/2013, 2:45 PM  Patient seen and evaluated and okay for discharge.

## 2013-09-26 NOTE — Evaluation (Signed)
Physical Therapy Evaluation Patient Details Name: Barbara Garner MRN: [id number] DOB: 24-Dec-1934 Today's Date: 09/26/2013   History of Present Illness  patient admitted 09/24/13 with weakness, poor PO intake, back and neck pain, pneumonia. Pt  DC'd from hospital 09/18/13. Neurology workup negative for cord compression and LP does not show infectious process.  Clinical Impression  Pt tolerated mobilizing to recliner, not ready for ambulation. Pt is quite "stiff", c/o back pain, reports neck feels better since getting up. Pt will benefit from PT to address problems listed in note below. Primary son/caregiver not present to discuss DC plan. Per pt, 24/7 caregivers available, although pt is at a lower functioning level than her baseline prior to last admission 1 week ago.     Follow Up Recommendations SNF;Home health PT;Supervision/Assistance - 24 hour (primary caregivers(son) not present. Pt  agreeable to SNF if needed. )    Equipment Recommendations  Rolling walker with 5" wheels    Recommendations for Other Services       Precautions / Restrictions Precautions Precautions: Fall Precaution Comments: denies any recent falls       Mobility  Bed Mobility Overal bed mobility: Needs Assistance Bed Mobility: Supine to Sit;Rolling Rolling: Mod assist   Supine to sit: Mod assist;HOB elevated     General bed mobility comments: Pt lying in bed in angled position with head against L bed rail. tactile cues/assistance to roll to R side using bed pad to assist rolling. HOB raised to assist pt in sitting uprightwith  extra time and   support of trunk to assist upright sitting.  Transfers Overall transfer level: Needs assistance Equipment used: Rolling walker (2 wheeled) Transfers: Sit to/from [facility] Sit to Stand: Mod assist Stand pivot transfers: Mod assist       General transfer comment: extra time and lifting assistance to stand at RW,  small steps to turn to recliner,  pt tends to lean posteriorly initially but improved with standing.   Ambulation/Gait                Stairs            Wheelchair Mobility    Modified Rankin (Stroke Patients Only)       Balance Overall balance assessment: Needs assistance Sitting-balance support: Bilateral upper extremity supported;Feet supported Sitting balance-Leahy Scale: Poor Sitting balance - Comments: relies on UE for support in sitting, reports back pain worsens when she lets her arm support go to place robe . Postural control: Posterior lean Standing balance support: Bilateral upper extremity supported;During functional activity Standing balance-Leahy Scale: Poor Standing balance comment: posterior lean                             Pertinent Vitals/Pain Mid low back is6/ improved after getting OOB.    Home Living Family/patient expects to be discharged to:: Private residence Living Arrangements: Children Available Help at Discharge: Family;Available 24 hours/day Type of Home: House Home Access: Stairs to enter;Level entry   Entrance Stairs-Number of Steps: 3 Home Layout: Multi-level;Able to live on main level with bedroom/bathroom Home Equipment: Walker - standard Additional Comments: prior to previous admit, pt was independent,  pt  reported pt has someone with her 24/7 , required some assist after last admit    Prior Function Level of Independence: Needs assistance               Hand Dominance  Extremity/Trunk Assessment   Upper Extremity Assessment: Generalized weakness           Lower Extremity Assessment: Generalized weakness      Cervical / Trunk Assessment: Normal  Communication   Communication: No difficulties  Cognition Arousal/Alertness: Awake/alert Behavior During Therapy: WFL for tasks assessed/performed Overall Cognitive Status: No family/caregiver present to determine baseline cognitive functioning Area of Impairment:  Orientation;Safety/judgement;Awareness;Problem solving Orientation Level: Time;Situation   Memory: Decreased short-term memory         General Comments: \; pt with difficulty word finding and processing instructions at times     General Comments      Exercises        Assessment/Plan    PT Assessment Patient needs continued PT services  PT Diagnosis Abnormality of gait;Generalized weakness;Acute pain   PT Problem List Decreased balance;Decreased activity tolerance;Decreased mobility;Decreased cognition;Decreased safety awareness;Decreased knowledge of use of DME  PT Treatment Interventions DME instruction;Gait training;Functional mobility training;Therapeutic activities;Balance training;Therapeutic exercise;Neuromuscular re-education;Patient/family education   PT Goals (Current goals can be found in the Care Plan section) Acute Rehab PT Goals Patient Stated Goal: to go home with sons PT Goal Formulation: With patient Time For Goal Achievement: 10/10/13 Potential to Achieve Goals: Good    Frequency Min 3X/week   Barriers to discharge        Co-evaluation               End of Session   Activity Tolerance: Patient limited by pain Patient left: in chair;with call bell/phone within reach;with chair alarm set;with family/visitor present Nurse Communication: Mobility status         Time: [id number] PT Time Calculation (min): 40 min   Charges:     PT Treatments $Therapeutic Activity: 23-37 mins   PT G Codes:          Claretha Cooper 09/26/2013, 12:59 PM Tresa Endo PT [phone]

## 2013-09-26 NOTE — Progress Notes (Signed)
Utilization review completed.  

## 2013-09-26 NOTE — Progress Notes (Signed)
Pt unable to get up to [facility] due to increased pain in her back.  Sons at bedside state they will not be able to care for her like this.  Pain med given.  Dr. Wendee Beavers notified.  D/C has been cancelled.  Family agrees to SNF; Pt was at [facility] last year for about 3 weeks; they would like for her to return there if at all possible.

## 2013-09-26 NOTE — Progress Notes (Signed)
Subjective: Continues to complain of neck pain  Exam: Filed Vitals:   09/26/13 0753  BP: 138/55  Pulse: 66  Temp:   Resp:    Gen: In bed, NAD MS: Awake, alert, interactive and follows commands.  [id number], EOMI Motor: moves all extremities well, but has a possible mild increase in tone in her lower extremities bilaterally.  Sensory:intact to LT   Impression: 79 yo F with progressive difficulty walking and neck/back pain. Her pain has largely improved to her baseline. She has no spinal compression on her myelogram and CSF does not show any evidence for infectious process. She may benefit from PT.   Recommendations: 1) Would consider PT 2) No further neurodiagnostic studies at this time, neurology to sign off. Please call with any remaining questions.    Roland Rack, MD [facility] [phone]  If 7pm- 7am, please page neurology on call as listed in [place].

## 2013-09-27 LAB — GLUCOSE, CAPILLARY
GLUCOSE-CAPILLARY: 121 mg/dL — AB (ref 70–99)
GLUCOSE-CAPILLARY: 162 mg/dL — AB (ref 70–99)
Glucose-Capillary: 130 mg/dL — ABNORMAL HIGH (ref 70–99)

## 2013-09-27 NOTE — Clinical Social Work Placement (Addendum)
Clinical Social Work Department CLINICAL SOCIAL WORK PLACEMENT NOTE 09/27/2013  Patient:  Barbara Garner, Barbara Garner  Account Number:  [id number] Admit date:  09/24/2013  Clinical Social Worker:  Carrington Clamp, LCSWA  Date/time:  09/27/2013 06:13 PM  Clinical Social Work is seeking post-discharge placement for this patient at the following level of care:   [place]   (*CSW will update this form in Epic as items are completed)   09/27/2013  Patient/family provided with [place] [place] Department of Clinical Social Work's list of facilities offering this level of care within the geographic area requested by the patient (or if unable, by the patient's family).  09/27/2013  Patient/family informed of their freedom to choose among providers that offer the needed level of care, that participate in Medicare, Medicaid or managed care program needed by the patient, have an available bed and are willing to accept the patient.  09/27/2013  Patient/family informed of [facility]' ownership interest in [facility], as well as of the fact that they are under no obligation to receive care at this facility.  PASARR submitted to EDS on Existing  PASARR number received on Existing  FL2 transmitted to all facilities in geographic area requested by pt/family on  09/27/2013 FL2 transmitted to all facilities within larger geographic area on   Patient informed that his/her managed care company has contracts with or will negotiate with  certain facilities, including the following:     Patient/family informed of bed offers received:  09/29/2013 Patient chooses bed at [facility] Physician recommends and patient chooses bed at    Patient to be transferred to  Abel[facility] Bachelor  09/29/2013 Patient to be transferred to facility by [facility] Patient and family notified of transfer on 09/29/2013 Name of family member notified:  Leonor Liv  The following physician request were entered in  Epic:   Additional Comments:  Carrington Clamp, [place] Weekend Clinical Social Worker [phone]

## 2013-09-27 NOTE — Clinical Social Work Psychosocial (Signed)
Clinical Social Work Department BRIEF PSYCHOSOCIAL ASSESSMENT 09/27/2013  Patient:  Barbara Garner, Barbara Garner     Account Number:  [id number]     Admit date:  09/24/2013  Clinical Social Worker:  Hubert Azure  Date/Time:  09/27/2013 06:06 PM  Referred by:  Physician  Date Referred:  09/27/2013 Referred for  SNF Placement   Other Referral:   Interview type:  Family Other interview type:   Son Architectural technologist)    PSYCHOSOCIAL DATA Living Status:  WITH ADULT CHILDREN Admitted from facility:   Level of care:   Primary support name:  Mickey Primary support relationship to patient:  CHILD, ADULT Degree of support available:   Good    CURRENT CONCERNS Current Concerns  Post-Acute Placement   Other Concerns:    SOCIAL WORK ASSESSMENT / PLAN CSW met with patient and son [name] who was present at bedside. CSW introduced self and explained role. CSW discussed d/c plan. Per son, patient has at [facility] for approximately 2 weeks then returned home to live with him and his wife. Per son, patient was ambulating without assistance, "getting around fine," until she had pneumonia. Son stated after getting pneumonia, patient's health rapidly declined. Son reported MD found herniated disk in patient's back and he feels rehab at a SNF will be best option for patient. Patient is agreeable to d/c plan.   Assessment/plan status:  Information/Referral to [employer] Other assessment/ plan:   CSW to update FL2 for SNF placement.   Information/referral to community resources:   CSW provided patient's son with community SNF list.    PATIENT'S/FAMILY'S RESPONSE TO PLAN OF CARE: Patient and sons were pleasant and engaging during discussion. Patient and son thanked CSW for assistance.    [place], [place] Weekend Clinical Social Worker [phone]

## 2013-09-27 NOTE — Progress Notes (Signed)
[facility] PROGRESS NOTE  Barbara Garner [id number] DOB: 01-24-1935 DOA: 09/24/2013 PCP: Loura Pardon, MD  Assessment/Plan: Principal Problem: Low back pain - Neurology consulted and further work up underway. No further neurological work up recommended currently. - d/c antibiotics as back pain is not related to active infection. - Most likely 2ary to herniated disk.  Active Problems:   HYPOTHYROIDISM - Stable, last TSH within normal limits. -Continue current Synthroid regimen     DIABETES MELLITUS, TYPE II - Continue diabetic diet and sliding scale insulin. Stable currently    HYPERTENSION - Patient currently on carvedilol, with blood pressures relatively well controlled    CORONARY ARTERY DISEASE - Patient on aspirin and statin. Currently staying    GERD - Stable on PPI  History of complete heart block status post placement of placement. - No acute issues monitor .   Code Status: full Family Communication: discussed with pt and son at bedside. Disposition Plan: SNF placement.   Consultants:  Neurology  Procedures:  Please see neuro note  Antibiotics:  Meropenem and vancomycin>>>09/27/13  HPI/Subjective: No new complaints. Pain relatively well controlled  Objective: Filed Vitals:   09/27/13 0539  BP: 167/57  Pulse: 66  Temp: 97.5 F (36.4 C)  Resp: 18    Intake/Output Summary (Last 24 hours) at 09/27/13 1457 Last data filed at 09/27/13 0900  Gross per 24 hour  Intake    540 ml  Output    175 ml  Net    365 ml   Filed Weights   09/25/13 0630 09/26/13 0600 09/27/13 0539  Weight: 52 kg (114 lb 10.2 oz) 51.7 kg (113 lb 15.7 oz) 48.353 kg (106 lb 9.6 oz)    Exam:   General:  Patient in no acute distress, alert and awake  Cardiovascular:+S1 and S2, warm extremities no cyanosis  Respiratory: Clear to auscultation bilaterally, no wheezes  Abdomen: Soft, nondistended, nontender  Musculoskeletal: No cyanosis or clubbing   Data  Reviewed: Basic Metabolic Panel:  Recent Labs Lab 09/23/13 1515 09/24/13 0429 09/25/13 0453  NA 136 136* 139  K 4.6 3.2* 4.0  CL 94* 95* 101  CO2 28  --  24  GLUCOSE 152* 82 123*  BUN 48* 41* 35*  CREATININE 2.4* 2.20* 1.56*  CALCIUM 8.3*  --  7.5*   Liver Function Tests:  Recent Labs Lab 09/23/13 1515  AST 23  ALT 12  ALKPHOS 84  BILITOT 0.9  PROT 8.2  ALBUMIN 3.6    Recent Labs Lab 09/24/13 1412  LIPASE 18   No results found for this basename: AMMONIA,  in the last 168 hours CBC:  Recent Labs Lab 09/23/13 1515 09/24/13 0425 09/24/13 0429 09/25/13 0453  WBC 18.5 Repeated and verified X2.* 17.1*  --  12.7*  NEUTROABS 15.2* 13.4*  --   --   HGB 12.4 12.1 13.9 10.4*  HCT 37.7 36.0 41.0 32.2*  MCV 92.0 89.8  --  92.8  PLT 176.0 174  --  134*   Cardiac Enzymes: No results found for this basename: CKTOTAL, CKMB, CKMBINDEX, TROPONINI,  in the last 168 hours BNP (last 3 results)  Recent Labs  08/09/13 0850 09/16/13 1120  PROBNP 7547.0* 11854.0*   CBG:  Recent Labs Lab 09/26/13 0738 09/26/13 1141 09/26/13 2138 09/27/13 0745 09/27/13 1134  GLUCAP 91 182* 150* 130* 121*    Recent Results (from the past 240 hour(s))  URINE CULTURE     Status: None   Collection Time    09/23/13  4:11 PM      Result Value Ref Range Status   Colony Count NO GROWTH   Final   Organism ID, Bacteria NO GROWTH   Final  CULTURE, BLOOD (ROUTINE X 2)     Status: None   Collection Time    09/24/13  7:28 AM      Result Value Ref Range Status   Specimen Description BLOOD LEFT ANTECUBITAL   Final   Special Requests BOTTLES DRAWN AEROBIC AND ANAEROBIC 10CCS   Final   Culture  Setup Time     Final   Value: 09/24/2013 12:39     Performed at [facility]   Culture     Final   Value:        BLOOD CULTURE RECEIVED NO GROWTH TO DATE CULTURE WILL BE HELD FOR 5 DAYS BEFORE ISSUING A FINAL NEGATIVE REPORT     Performed at [facility]   Report Status PENDING    Incomplete  CULTURE, BLOOD (ROUTINE X 2)     Status: None   Collection Time    09/24/13  7:34 AM      Result Value Ref Range Status   Specimen Description BLOOD LEFT HAND   Final   Special Requests BOTTLES DRAWN AEROBIC ONLY 5CC   Final   Culture  Setup Time     Final   Value: 09/24/2013 12:39     Performed at [facility]   Culture     Final   Value:        BLOOD CULTURE RECEIVED NO GROWTH TO DATE CULTURE WILL BE HELD FOR 5 DAYS BEFORE ISSUING A FINAL NEGATIVE REPORT     Performed at [facility]   Report Status PENDING   Incomplete  URINE CULTURE     Status: None   Collection Time    09/24/13 11:20 AM      Result Value Ref Range Status   Specimen Description URINE, CATHETERIZED   Final   Special Requests NONE   Final   Culture  Setup Time     Final   Value: 09/24/2013 11:45     Performed at [place]     Final   Value: NO GROWTH     Performed at [facility]   Culture     Final   Value: NO GROWTH     Performed at [facility]   Report Status 09/25/2013 FINAL   Final  CSF CULTURE     Status: None   Collection Time    09/25/13 12:20 PM      Result Value Ref Range Status   Specimen Description CSF   Final   Special Requests 4.5ML CSF FLUID   Final   Gram Stain     Final   Value: WBC PRESENT,BOTH PMN AND MONONUCLEAR     NO ORGANISMS SEEN     Performed at [facility] CYTOSPIN     Performed at [facility]   Culture     Final   Value: NO GROWTH 2 DAYS     Performed at [facility]   Report Status PENDING   Incomplete  FUNGUS CULTURE W SMEAR     Status: None   Collection Time    09/25/13 12:20 PM      Result Value Ref Range Status   Specimen Description CSF   Final   Special Requests 3.0ML CSF FLUID   Final  Fungal Smear     Final   Value: NO YEAST OR FUNGAL ELEMENTS SEEN     Performed at [facility]   Culture     Final   Value: CULTURE IN PROGRESS FOR FOUR WEEKS      Performed at [facility]   Report Status PENDING   Incomplete  GRAM STAIN     Status: None   Collection Time    09/25/13 12:20 PM      Result Value Ref Range Status   Specimen Description CSF   Final   Special Requests 4.5MLS FLUID   Final   Gram Stain     Final   Value: CYTOSPIN PREP     SCANT PMN AND MONONUCLEAR WBC PRESENT     NO ORGANISMS SEEN   Report Status 09/25/2013 FINAL   Final     Studies: No results found.  Scheduled Meds: . aspirin EC  81 mg Oral Daily  . atorvastatin  40 mg Oral q1800  . carvedilol  6.25 mg Oral BID WC  . insulin aspart  0-9 Units Subcutaneous TID WC  . levothyroxine  50 mcg Oral QAC breakfast  . meropenem (MERREM) IV  500 mg Intravenous Q12H  . pantoprazole  40 mg Oral Daily  . sodium chloride  3 mL Intravenous Q12H  . vancomycin  500 mg Intravenous Q24H   Continuous Infusions:    Time spent: > 35 minutes    Barbara Garner  [facility] Pager [phone] If 7PM-7AM, please contact night-coverage at [email or web address], password [facility] 09/27/2013, 2:57 PM  LOS: 3 days

## 2013-09-28 LAB — CSF CULTURE: Culture: NO GROWTH

## 2013-09-28 LAB — GLUCOSE, CAPILLARY
GLUCOSE-CAPILLARY: 150 mg/dL — AB (ref 70–99)
GLUCOSE-CAPILLARY: 130 mg/dL — AB (ref 70–99)
GLUCOSE-CAPILLARY: 87 mg/dL (ref 70–99)
GLUCOSE-CAPILLARY: 143 mg/dL — AB (ref 70–99)
Glucose-Capillary: 158 mg/dL — ABNORMAL HIGH (ref 70–99)

## 2013-09-28 LAB — CSF CULTURE W GRAM STAIN

## 2013-09-28 NOTE — Progress Notes (Signed)
[facility] PROGRESS NOTE  Barbara Garner [id number] DOB: 10/04/1934 DOA: 09/24/2013 PCP: Loura Pardon, MD  Assessment/Plan: Principal Problem: Low back pain - Neurology consulted and further work up underway. No further neurological work up recommended currently. - d/c'd antibiotics as back pain is not related to active infection. - Most likely 2ary to herniated disk.  Active Problems:   HYPOTHYROIDISM - Stable, last TSH within normal limits. -Continue current Synthroid regimen     DIABETES MELLITUS, TYPE II - Continue diabetic diet and sliding scale insulin. Stable currently    HYPERTENSION - Patient currently on carvedilol, with blood pressures relatively well controlled    CORONARY ARTERY DISEASE - Patient on aspirin and statin. Currently staying    GERD - Stable on PPI  History of complete heart block status post placement of placement. - No acute issues monitor .   Code Status: full Family Communication: discussed with pt and son at bedside. Disposition Plan: SNF placement. Awaiting placement at this point.   Consultants:  Neurology  Procedures:  Please see neuro note  Antibiotics:  Meropenem and vancomycin>>>09/27/13  HPI/Subjective: No new complaints. Pain relatively well controlled  Objective: Filed Vitals:   09/28/13 1339  BP: 141/52  Pulse: 73  Temp: 97.7 F (36.5 C)  Resp: 18    Intake/Output Summary (Last 24 hours) at 09/28/13 1633 Last data filed at 09/28/13 1313  Gross per 24 hour  Intake    520 ml  Output    400 ml  Net    120 ml   Filed Weights   09/26/13 0600 09/27/13 0539 09/28/13 0500  Weight: 51.7 kg (113 lb 15.7 oz) 48.353 kg (106 lb 9.6 oz) 45.949 kg (101 lb 4.8 oz)    Exam:   General:  Patient in no acute distress, alert and awake  Cardiovascular:+S1 and S2, warm extremities no cyanosis  Respiratory: Clear to auscultation bilaterally, no wheezes  Abdomen: Soft, nondistended, nontender  Musculoskeletal:  No cyanosis or clubbing   Data Reviewed: Basic Metabolic Panel:  Recent Labs Lab 09/23/13 1515 09/24/13 0429 09/25/13 0453  NA 136 136* 139  K 4.6 3.2* 4.0  CL 94* 95* 101  CO2 28  --  24  GLUCOSE 152* 82 123*  BUN 48* 41* 35*  CREATININE 2.4* 2.20* 1.56*  CALCIUM 8.3*  --  7.5*   Liver Function Tests:  Recent Labs Lab 09/23/13 1515  AST 23  ALT 12  ALKPHOS 84  BILITOT 0.9  PROT 8.2  ALBUMIN 3.6    Recent Labs Lab 09/24/13 1412  LIPASE 18   No results found for this basename: AMMONIA,  in the last 168 hours CBC:  Recent Labs Lab 09/23/13 1515 09/24/13 0425 09/24/13 0429 09/25/13 0453  WBC 18.5 Repeated and verified X2.* 17.1*  --  12.7*  NEUTROABS 15.2* 13.4*  --   --   HGB 12.4 12.1 13.9 10.4*  HCT 37.7 36.0 41.0 32.2*  MCV 92.0 89.8  --  92.8  PLT 176.0 174  --  134*   Cardiac Enzymes: No results found for this basename: CKTOTAL, CKMB, CKMBINDEX, TROPONINI,  in the last 168 hours BNP (last 3 results)  Recent Labs  08/09/13 0850 09/16/13 1120  PROBNP 7547.0* 11854.0*   CBG:  Recent Labs Lab 09/27/13 1134 09/27/13 1645 09/27/13 2104 09/28/13 0738 09/28/13 1142  GLUCAP 121* 162* 150* 130* 158*    Recent Results (from the past 240 hour(s))  URINE CULTURE     Status: None   Collection  Time    09/23/13  4:11 PM      Result Value Ref Range Status   Colony Count NO GROWTH   Final   Organism ID, Bacteria NO GROWTH   Final  CULTURE, BLOOD (ROUTINE X 2)     Status: None   Collection Time    09/24/13  7:28 AM      Result Value Ref Range Status   Specimen Description BLOOD LEFT ANTECUBITAL   Final   Special Requests BOTTLES DRAWN AEROBIC AND ANAEROBIC 10CCS   Final   Culture  Setup Time     Final   Value: 09/24/2013 12:39     Performed at [facility]   Culture     Final   Value:        BLOOD CULTURE RECEIVED NO GROWTH TO DATE CULTURE WILL BE HELD FOR 5 DAYS BEFORE ISSUING A FINAL NEGATIVE REPORT     Performed at [facility]   Report Status PENDING   Incomplete  CULTURE, BLOOD (ROUTINE X 2)     Status: None   Collection Time    09/24/13  7:34 AM      Result Value Ref Range Status   Specimen Description BLOOD LEFT HAND   Final   Special Requests BOTTLES DRAWN AEROBIC ONLY 5CC   Final   Culture  Setup Time     Final   Value: 09/24/2013 12:39     Performed at [facility]   Culture     Final   Value:        BLOOD CULTURE RECEIVED NO GROWTH TO DATE CULTURE WILL BE HELD FOR 5 DAYS BEFORE ISSUING A FINAL NEGATIVE REPORT     Performed at [facility]   Report Status PENDING   Incomplete  URINE CULTURE     Status: None   Collection Time    09/24/13 11:20 AM      Result Value Ref Range Status   Specimen Description URINE, CATHETERIZED   Final   Special Requests NONE   Final   Culture  Setup Time     Final   Value: 09/24/2013 11:45     Performed at [place]     Final   Value: NO GROWTH     Performed at [facility]   Culture     Final   Value: NO GROWTH     Performed at [facility]   Report Status 09/25/2013 FINAL   Final  CSF CULTURE     Status: None   Collection Time    09/25/13 12:20 PM      Result Value Ref Range Status   Specimen Description CSF   Final   Special Requests 4.5ML CSF FLUID   Final   Gram Stain     Final   Value: WBC PRESENT,BOTH PMN AND MONONUCLEAR     NO ORGANISMS SEEN     Performed at [facility] CYTOSPIN     Performed at [facility]   Culture     Final   Value: NO GROWTH 3 DAYS     Performed at [facility]   Report Status 09/28/2013 FINAL   Final  FUNGUS CULTURE W SMEAR     Status: None   Collection Time    09/25/13 12:20 PM      Result Value Ref Range Status   Specimen Description CSF   Final   Special Requests  3.0ML CSF FLUID   Final   Fungal Smear     Final   Value: NO YEAST OR FUNGAL ELEMENTS SEEN     Performed at [facility]   Culture     Final   Value: CULTURE  IN PROGRESS FOR FOUR WEEKS     Performed at [facility]   Report Status PENDING   Incomplete  GRAM STAIN     Status: None   Collection Time    09/25/13 12:20 PM      Result Value Ref Range Status   Specimen Description CSF   Final   Special Requests 4.5MLS FLUID   Final   Gram Stain     Final   Value: CYTOSPIN PREP     SCANT PMN AND MONONUCLEAR WBC PRESENT     NO ORGANISMS SEEN   Report Status 09/25/2013 FINAL   Final     Studies: No results found.  Scheduled Meds: . aspirin EC  81 mg Oral Daily  . atorvastatin  40 mg Oral q1800  . carvedilol  6.25 mg Oral BID WC  . insulin aspart  0-9 Units Subcutaneous TID WC  . levothyroxine  50 mcg Oral QAC breakfast  . pantoprazole  40 mg Oral Daily  . sodium chloride  3 mL Intravenous Q12H   Continuous Infusions:    Time spent: > 35 minutes    Velvet Bathe  [facility] Pager [phone] If 7PM-7AM, please contact night-coverage at [email or web address], password [facility] 09/28/2013, 4:33 PM  LOS: 4 days

## 2013-09-29 LAB — GLUCOSE, CAPILLARY
GLUCOSE-CAPILLARY: 148 mg/dL — AB (ref 70–99)
Glucose-Capillary: 180 mg/dL — ABNORMAL HIGH (ref 70–99)
Glucose-Capillary: 157 mg/dL — ABNORMAL HIGH (ref 70–99)

## 2013-09-29 MED ORDER — LABETALOL HCL 5 MG/ML IV SOLN
5.0000 mg | INTRAVENOUS | Status: DC | PRN
  Filled 2013-09-29: qty 4

## 2013-09-29 NOTE — Progress Notes (Signed)
CSW (Clinical Education officer, museum) prepared pt dc packet and placed with shadow chart. CSW arranged non-emergent ambulance transport. Pt, pt son Barbara Garner, pt nurse, and facility informed. CSW signing off.  [place], [place]

## 2013-09-29 NOTE — Progress Notes (Signed)
MD on call notified. Pt c/o of SOB. O2 sat 94% on RA. Lung sounds clear. VS charted. Pt placed on 2L  currently sat 99%. Pt is in no distress. New orders given for BP. Will continue to monitor the pt. [place]

## 2013-09-29 NOTE — Progress Notes (Signed)
CSW Armed forces technical officer) spoke with pt son [name] and provided bed offers. Pt son wanting pt to dc to [facility]. CSW called facility and notified. CSW also faxed clinicals to [place] with insurance company and notified of dc today to [facility].  [place], [place]

## 2013-09-29 NOTE — Progress Notes (Signed)
Report given to [place]. Awaiting transport.

## 2013-09-29 NOTE — Progress Notes (Signed)
Physical Therapy Treatment Patient Details Name: Barbara Garner MRN: [id number] DOB: 1934/05/30 Today's Date: 09/29/2013    History of Present Illness patient admitted 09/24/13 with weakness, poor PO intake, back and neck pain, pneumonia. Pt  DC'd from hospital 09/18/13. Neurology workup negative for cord compression and LP does not show infectious process.    PT Comments    Patient requires encouragement to participate in therapy. Mobility limited by pain in R hip. Increased ambulation distance today with coaxing. Pt with short term memory deficits requiring constant VC to remind pt of exercise/task to be performed. Pt given pain medication prior to treatment. Will continue to follow.   Follow Up Recommendations  SNF;Supervision/Assistance - 24 hour     Equipment Recommendations  Rolling walker with 5" wheels    Recommendations for Other Services       Precautions / Restrictions Precautions Precautions: Fall Restrictions Weight Bearing Restrictions: No    Mobility  Bed Mobility Overal bed mobility: Needs Assistance Bed Mobility: Supine to Sit     Supine to sit: HOB elevated;Mod assist     General bed mobility comments: Pt reaching out to grab therapist; VC provided to reach for bed rail and roll to side. Mod A required to assist with BLEs and trunk to get to EOB. Increased time.  Transfers Overall transfer level: Needs assistance Equipment used: Rolling walker (2 wheeled) Transfers: Sit to/from Stand Sit to Stand: Min assist         General transfer comment: Increased time to stand with VC provided for hand placement, technique and anterior translation.  Ambulation/Gait Ambulation/Gait assistance: Min guard Ambulation Distance (Feet): 30 Feet Assistive device: Rolling walker (2 wheeled) Gait Pattern/deviations: Decreased step length - right;Decreased stance time - right;Decreased stride length;Trunk flexed   Gait velocity interpretation: Below normal speed for  age/gender General Gait Details: Min guard for safety as pt with visual deficits and increased pain with WB through BLEs (Left knee and Rt hip). Slow, guarded gait.   Stairs            Wheelchair Mobility    Modified Rankin (Stroke Patients Only)       Balance Overall balance assessment: Needs assistance Sitting-balance support: Bilateral upper extremity supported;Feet supported Sitting balance-Leahy Scale: Fair Sitting balance - Comments: Requires UE support for sitting balance secondary to pain in R posterior hip. Postural control: Posterior lean Standing balance support: Bilateral upper extremity supported;During functional activity Standing balance-Leahy Scale: Poor Standing balance comment: Use of RW for support. Standing balance ~30 sec with UE support.                    Cognition Arousal/Alertness: Awake/alert Behavior During Therapy: WFL for tasks assessed/performed Overall Cognitive Status: History of cognitive impairments - at baseline Area of Impairment: Memory     Memory: Decreased short-term memory (Requires constant cues for re-direction and reminders to stay on task.)   Safety/Judgement: Decreased awareness of safety     General Comments: Pt with difficulty processing multi step commands. Constant repetition needed.    Exercises General Exercises - Lower Extremity Ankle Circles/Pumps: Both;10 reps;Seated Long Arc Quad: Both;10 reps;Seated Hip Flexion/Marching: Both;10 reps;Seated Toe Raises: Both;10 reps;Seated    General Comments General comments (skin integrity, edema, etc.): Pain to palpation R posterior hip and into buttock. Increased pain noted with active hip flexion, hip IR/ER. Position of comfort is hip IR in supine, able to correct to neutral with cues.      Pertinent Vitals/Pain No pain  reported at rest. Pt reports pain in R hip with movement - upon standing, with hip flexion and weightbearing through RLE. Does not rate pain. Pt  repositioned and RN made aware.    Home Living                      Prior Function            PT Goals (current goals can now be found in the care plan section) Progress towards PT goals: Progressing toward goals    Frequency  Min 2X/week    PT Plan Frequency needs to be updated    Co-evaluation             End of Session Equipment Utilized During Treatment: Gait belt Activity Tolerance: Patient limited by pain Patient left: in chair;with call bell/phone within reach;with chair alarm set;with family/visitor present     Time: 1337-1401 PT Time Calculation (min): 24 min  Charges:  $Therapeutic Exercise: 8-22 mins $Therapeutic Activity: 8-22 mins                    G CodesCandy Sledge A 10-20-13, 2:12 PM Candy Sledge, [place], DPT [phone]

## 2013-09-30 ENCOUNTER — Other Ambulatory Visit: Payer: Self-pay | Admitting: *Deleted

## 2013-09-30 ENCOUNTER — Telehealth: Payer: Self-pay | Admitting: Family Medicine

## 2013-09-30 LAB — CULTURE, BLOOD (ROUTINE X 2)
Culture: NO GROWTH
Culture: NO GROWTH

## 2013-09-30 MED ORDER — HYDROCODONE-ACETAMINOPHEN 5-325 MG PO TABS: 1.0000 | ORAL_TABLET | ORAL | Status: DC | PRN

## 2013-09-30 NOTE — Telephone Encounter (Signed)
Transitional Care Call attempted.  Spoke with pt's son who stated that pt has been d/c'd from hospital to [facility].  He will call our office to schedule hospital follow up once pt is d/c'd home from SNF in 2-3 weeks.

## 2013-09-30 NOTE — Telephone Encounter (Signed)
[facility] 

## 2013-09-30 NOTE — Telephone Encounter (Signed)
That sounds fine, thanks  

## 2013-10-01 ENCOUNTER — Non-Acute Institutional Stay (SKILLED_NURSING_FACILITY): Payer: Medicare Other | Admitting: Internal Medicine

## 2013-10-01 ENCOUNTER — Encounter: Payer: Self-pay | Admitting: Internal Medicine

## 2013-10-01 DIAGNOSIS — Z954 Presence of other heart-valve replacement: Secondary | ICD-10-CM

## 2013-10-01 DIAGNOSIS — M545 Low back pain, unspecified: Secondary | ICD-10-CM

## 2013-10-01 DIAGNOSIS — N184 Chronic kidney disease, stage 4 (severe): Secondary | ICD-10-CM

## 2013-10-01 DIAGNOSIS — I2510 Atherosclerotic heart disease of native coronary artery without angina pectoris: Secondary | ICD-10-CM

## 2013-10-01 DIAGNOSIS — I509 Heart failure, unspecified: Secondary | ICD-10-CM

## 2013-10-01 DIAGNOSIS — Z950 Presence of cardiac pacemaker: Secondary | ICD-10-CM

## 2013-10-01 DIAGNOSIS — K219 Gastro-esophageal reflux disease without esophagitis: Secondary | ICD-10-CM

## 2013-10-01 DIAGNOSIS — E7800 Pure hypercholesterolemia, unspecified: Secondary | ICD-10-CM

## 2013-10-01 DIAGNOSIS — E119 Type 2 diabetes mellitus without complications: Secondary | ICD-10-CM

## 2013-10-01 DIAGNOSIS — I454 Nonspecific intraventricular block: Secondary | ICD-10-CM

## 2013-10-01 DIAGNOSIS — E559 Vitamin D deficiency, unspecified: Secondary | ICD-10-CM

## 2013-10-01 DIAGNOSIS — I5032 Chronic diastolic (congestive) heart failure: Secondary | ICD-10-CM

## 2013-10-01 DIAGNOSIS — Z952 Presence of prosthetic heart valve: Secondary | ICD-10-CM

## 2013-10-01 DIAGNOSIS — I4891 Unspecified atrial fibrillation: Secondary | ICD-10-CM

## 2013-10-01 DIAGNOSIS — I455 Other specified heart block: Secondary | ICD-10-CM

## 2013-10-01 DIAGNOSIS — I480 Paroxysmal atrial fibrillation: Secondary | ICD-10-CM

## 2013-10-01 DIAGNOSIS — D72829 Elevated white blood cell count, unspecified: Secondary | ICD-10-CM

## 2013-10-01 DIAGNOSIS — E039 Hypothyroidism, unspecified: Secondary | ICD-10-CM

## 2013-10-01 NOTE — Assessment & Plan Note (Signed)
Continue lipitor 40mg  

## 2013-10-01 NOTE — Assessment & Plan Note (Signed)
A1c 7.1; glipizide;pt on statin

## 2013-10-01 NOTE — Assessment & Plan Note (Signed)
Continue omeprazole 

## 2013-10-01 NOTE — Assessment & Plan Note (Signed)
Coreg and ASA

## 2013-10-01 NOTE — Assessment & Plan Note (Signed)
Cr 1.56 last

## 2013-10-01 NOTE — Assessment & Plan Note (Signed)
ASA 81 mg only; tissue valve

## 2013-10-01 NOTE — Assessment & Plan Note (Signed)
Coreg, lasix and ASA

## 2013-10-01 NOTE — Assessment & Plan Note (Signed)
Most likely due to herniated disk. No cord compression on myelogram  - Will provide pain medication for relief  - Neurology evaluated and recommended the following:  Recommendations:  1) Would consider PT  2) No further neurodiagnostic studies at this time, neurology to sign off. Please call with any remaining questions.  PT evaluation recommended skilled nursing facility or home health PT if supervision/assistance available 24 hours. Per my discussion with nursing and per documented EMR notes patient has 24-hour supervision. Also recommended rolling walker with 5 inch wheels of which orders were placed

## 2013-10-01 NOTE — Assessment & Plan Note (Signed)
Repletion

## 2013-10-01 NOTE — Assessment & Plan Note (Signed)
ASA and statin

## 2013-10-01 NOTE — Assessment & Plan Note (Signed)
-   Pan culture negative including CSF  - Imaging studies reported herniated disc  - No fevers, as such leukocytosis most likely secondary to dehydration stress reaction

## 2013-10-01 NOTE — Assessment & Plan Note (Signed)
Synthroid 50 mcg

## 2013-10-01 NOTE — Progress Notes (Signed)
MRN: [id number] Name: Barbara Garner  Sex: female Age: 79 y.o. DOB: 1934-04-21  [place] #: [facility] Facility/Room: 104 Level Of Care: SNF Provider: Inocencio Homes D Emergency Contacts: Extended Emergency Contact Information Primary Emergency Contact: Liguori,Mickey Address: [address]          [place], [place] [address] Johnnette Litter of [place] Phone: [phone] Mobile Phone: [phone] Relation: Son Secondary Emergency Contact: Rickard Patience [place] of [place] Mobile Phone: [phone] Relation: Son  Code Status:   Allergies: Metformin and Promethazine hcl  Chief Complaint  Patient presents with  . nursing home admission    HPI: Patient is 79 y.o. female who was w/u in hospital for LBP which was neg so probably herniated disk; admitted OT/PT.  Past Medical History  Diagnosis Date  . Iron deficiency anemia, unspecified   . Chronic diastolic CHF (congestive heart failure)   . CAD  S/P CABG x 3 08/2011     LIMA to diagonal branch, SVG to OM1, SVG to PDA, EVH via bilateral thighs  . Type II or unspecified type diabetes mellitus without mention of complication, not stated as uncontrolled   . Contact dermatitis and other eczema, due to unspecified cause   . Esophageal reflux   . Headache(784.0)   . Pacemaker MDT dual chamber     DOI 08/2011  . Pure hypercholesterolemia   . hypertension   . Hypothyroidism   . Atrioventricular block, complete -intermittent   . Memory loss   . Idiopathic peripheral neuropathy   . LBBB (left bundle branch block)   . Carotid stenosis     Carotid dopplers 0000000 with [id number] LICA stenosis  . Severe aortic stenosis -S/P AVR      19 mm [facility] Ease pericardial tissue valve  . Extrinsic asthma, unspecified     hasn't used inhaler in past year  . CKD (chronic kidney disease) stage 4, GFR 15-29 ml/min     Cr 1.6 in 6/11, sees Dr. Arty Baumgartner  . Impaired vision     pt. reports that she identifies her meds by looking at the pillls,  she is not able to read labels on bottles    Past Surgical History  Procedure Laterality Date  . Ptca  [phone]    5 blockages  . Cardiac catheterization    . Esophageal dilation  1997  . Colonoscopy  04/1999    1 polyp  . Esophagogastroduodenoscopy  04/1999    HH; "watermelon stomach" (severe gastritis)  . Dexa  10/2003 ,09/2005    osteoporosis,  Osteopenia  . Cataract extraction, bilateral  2003  . Esophagogastroduodenoscopy  04/2002    Gastritis  . Cardiovascular stress test  10/2002    normal (per patient)  . Abdominal hysterectomy  1977    partial; fibroids  . Bladder tack  1977  . Colonoscopy  06/2004    Neg. Int hem  . Carotid dopplers  2006  . Refractive surgery  2006  . Opthy  11/1998;12/01;11/02  . Aortic valve replacement  09/06/2011    Procedure: AORTIC VALVE REPLACEMENT (AVR);  Surgeon: Rexene Alberts, MD;  Location: [facility];  Service: Open Heart Surgery;  Laterality: N/A;  . Coronary artery bypass graft  09/06/2011    Procedure: CORONARY ARTERY BYPASS GRAFTING (CABG);  Surgeon: Rexene Alberts, MD;  Location: [facility];  Service: Open Heart Surgery;  Laterality: N/A;  . Colonoscopy  08/2013      Medication List       This list is  accurate as of: 10/01/13 11:59 PM.  Always use your most recent med list.               aspirin 81 MG EC tablet  Take 1 tablet (81 mg total) by mouth daily.     atorvastatin 40 MG tablet  Commonly known as:  LIPITOR  Take 40 mg by mouth daily.     carvedilol 12.5 MG tablet  Commonly known as:  COREG  Take 12.5 mg by mouth 2 (two) times daily with a meal.     fluticasone 50 MCG/ACT nasal spray  Commonly known as:  FLONASE  Place 2 sprays into both nostrils daily.     furosemide 40 MG tablet  Commonly known as:  LASIX  Take 1 tablet (40 mg total) by mouth daily.     glipiZIDE 10 MG 24 hr tablet  Commonly known as:  GLUCOTROL XL  Take 10 mg by mouth 2 (two) times daily.     HYDROcodone-acetaminophen 5-325 MG per tablet   Commonly known as:  NORCO/VICODIN  Take 1 tablet by mouth every 4 (four) hours as needed for moderate pain.     levothyroxine 50 MCG tablet  Commonly known as:  SYNTHROID, LEVOTHROID  Take 50 mcg by mouth daily before breakfast.     omeprazole 20 MG capsule  Commonly known as:  PRILOSEC  Take 20 mg by mouth daily.     OVER THE COUNTER MEDICATION  Apply 1 application topically daily as needed (for stiffness in neck). "Muscle Reliever cream"     Potassium Chloride ER 20 MEQ Tbcr  Take 20 mEq by mouth daily. Take when taking lasix.     Vitamin D-3 1000 UNITS Caps  Take 1 capsule by mouth daily.        No orders of the defined types were placed in this encounter.    Immunization History  Administered Date(s) Administered  . Influenza Split 05/11/2011  . Influenza Whole 12/18/2005, 01/20/2009, 01/05/2010  . Influenza,inj,Quad PF,36+ Mos 11/26/2012  . Pneumococcal Polysaccharide-23 08/27/2008  . Td 05/18/2005    History  Substance Use Topics  . Smoking status: Never Smoker   . Smokeless tobacco: Never Used  . Alcohol Use: No    Family history is noncontributory    Review of Systems  DATA OBTAINED: from patient GENERAL:  no fevers, fatigue, appetite changes SKIN: No itching, rash or wounds EYES: No eye pain, redness, discharge EARS: No earache, tinnitus, change in hearing NOSE: No congestion, drainage or bleeding  MOUTH/THROAT: No mouth or tooth pain,  RESPIRATORY: No cough, wheezing, SOB CARDIAC: No chest pain, palpitations, lower extremity edema  GI: No abdominal pain, No N/V/D or constipation, No heartburn or reflux  GU: No dysuria, frequency or urgency, or incontinence  MUSCULOSKELETAL: No unrelieved bone/joint pain NEUROLOGIC: No headache, dizziness or focal weakness PSYCHIATRIC: No overt anxiety or sadness. Sleeps well.   Filed Vitals:   10/01/13 1514  BP: 148/70  Pulse: 68  Temp: 97.4 F (36.3 C)  Resp: 20    Physical Exam  GENERAL APPEARANCE:  Alert, conversant. Appropriately groomed. No acute distress.  SKIN: No diaphoresis rash HEAD: Normocephalic, atraumatic  EYES: Conjunctiva/lids clear. Pupils round, reactive. EOMs intact.  EARS: External exam WNL, canals clear. Hearing grossly normal.  NOSE: No deformity or discharge.  MOUTH/THROAT: Lips w/o lesions.  RESPIRATORY: Breathing is even, unlabored. Lung sounds are clear   CARDIOVASCULAR: Heart RRR no murmurs, rubs or gallops. No peripheral edema.  GASTROINTESTINAL: Abdomen is soft,  non-tender, not distended w/ normal bowel sounds GENITOURINARY: Bladder non tender, not distended  MUSCULOSKELETAL: No abnormal joints or musculature NEUROLOGIC:  Cranial nerves 2-12 grossly intact. Moves all extremities no tremor. PSYCHIATRIC: Mood and affect appropriate to situation, no behavioral issues  Patient Active Problem List   Diagnosis Date Noted  . Leukocytosis, unspecified 10/01/2013  . Low back pain 09/24/2013  . Medication administered in error 09/23/2013  . HCAP (healthcare-associated pneumonia) 09/16/2013  . Acute and chronic respiratory failure 08/09/2013  . Acute respiratory failure 08/09/2013  . Urge incontinence of urine 07/16/2013  . Urine incontinence 02/07/2013  . Urinary frequency 02/07/2013  . UTI (urinary tract infection) 02/07/2013  . Sinus bradycardia 09/24/2012  . Vitamin D deficiency disease 08/23/2012  . Lichen sclerosus et atrophicus of the vulva 04/05/2012  . Acute back pain 03/22/2012  . Chronic diastolic CHF (congestive heart failure) 11/15/2011  . Pacemaker-[employer] 09/13/2011  . Atrial fibrillation 09/12/2011  . Complete heart block-intermittent 09/09/2011  . S/P aortic valve replacement 09/06/2011  . S/P CABG x 3 09/06/2011  . IVCD (intraventricular conduction defect)   . CKD (chronic kidney disease) stage 4, GFR 15-29 ml/min   . Carotid stenosis 05/30/2011  . Squamous cell carcinoma of skin 07/21/2010  . INTERTRIGO, CANDIDAL 03/24/2010  . MEMORY  LOSS 08/19/2009  . HYPERCHOLESTEROLEMIA, PURE 09/20/2006  . HYPOTHYROIDISM 06/27/2006  . DIABETES MELLITUS, TYPE II 06/27/2006  . ANEMIA-IRON DEFICIENCY 06/27/2006  . PERIPHERAL NEUROPATHY 06/27/2006  . HYPERTENSION 06/27/2006  . MYOCARDIAL INFARCTION, HX OF 06/27/2006  . CORONARY ARTERY DISEASE 06/27/2006  . ASTHMA 06/27/2006  . GERD 06/27/2006  . PNEUMONIA, HX OF 06/27/2006  . ECZEMA 06/27/2006    CBC    Component Value Date/Time   WBC 12.7* 09/25/2013 0453   RBC 3.47* 09/25/2013 0453   RBC 2.88* 08/09/2013 1115   HGB 10.4* 09/25/2013 0453   HCT 32.2* 09/25/2013 0453   PLT 134* 09/25/2013 0453   MCV 92.8 09/25/2013 0453   LYMPHSABS 1.4 09/24/2013 0425   MONOABS 2.4* 09/24/2013 0425   EOSABS 0.0 09/24/2013 0425   BASOSABS 0.0 09/24/2013 0425    CMP     Component Value Date/Time   NA 139 09/25/2013 0453   K 4.0 09/25/2013 0453   CL 101 09/25/2013 0453   CO2 24 09/25/2013 0453   GLUCOSE 123* 09/25/2013 0453   BUN 35* 09/25/2013 0453   CREATININE 1.56* 09/25/2013 0453   CALCIUM 7.5* 09/25/2013 0453   PROT 8.2 09/23/2013 1515   ALBUMIN 3.6 09/23/2013 1515   AST 23 09/23/2013 1515   ALT 12 09/23/2013 1515   ALKPHOS 84 09/23/2013 1515   BILITOT 0.9 09/23/2013 1515   GFRNONAA 31* 09/25/2013 0453   GFRAA 36* 09/25/2013 0453    Assessment and Plan  Low back pain Most likely due to herniated disk. No cord compression on myelogram  - Will provide pain medication for relief  - Neurology evaluated and recommended the following:  Recommendations:  1) Would consider PT  2) No further neurodiagnostic studies at this time, neurology to sign off. Please call with any remaining questions.  PT evaluation recommended skilled nursing facility or home health PT if supervision/assistance available 24 hours. Per my discussion with nursing and per documented EMR notes patient has 24-hour supervision. Also recommended rolling walker with 5 inch wheels of which orders were placed   Leukocytosis, unspecified - Pan culture negative  including CSF  - Imaging studies reported herniated disc  - No fevers, as such leukocytosis most likely  secondary to dehydration stress reaction   Atrial fibrillation Coreg and ASA  Chronic diastolic CHF (congestive heart failure) Coreg, lasix and ASA  CORONARY ARTERY DISEASE ASA and statin  GERD Continue omeprazole   DIABETES MELLITUS, TYPE II A1c 7.1; glipizide;pt on statin  HYPOTHYROIDISM Synthroid 50 mcg  CKD (chronic kidney disease) stage 4, GFR 15-29 ml/min Cr 1.56 last   HYPERCHOLESTEROLEMIA, PURE Continue lipitor 40 mg  S/P aortic valve replacement ASA 81 mg only; tissue valve  Vitamin D deficiency disease Repletion     Hennie Duos, MD

## 2013-10-05 ENCOUNTER — Encounter: Payer: Self-pay | Admitting: Internal Medicine

## 2013-10-06 ENCOUNTER — Encounter: Payer: Self-pay | Admitting: Internal Medicine

## 2013-10-06 ENCOUNTER — Non-Acute Institutional Stay (SKILLED_NURSING_FACILITY): Payer: Medicare Other | Admitting: Internal Medicine

## 2013-10-06 DIAGNOSIS — K219 Gastro-esophageal reflux disease without esophagitis: Secondary | ICD-10-CM

## 2013-10-06 NOTE — Progress Notes (Signed)
Patient ID: Barbara Garner, female   DOB: 11/03/1934, 79 y.o.   MRN: [id number]   This is an acute visit.  Level care skilled.  Facility AF   Chief complaint-acute visit secondary question dysphasia.  History of present illness.  Patient is a pleasant 79 year old female was recently hospitalized for low back pain thought to be probably a herniated disc she is here for occupational and physical therapy.  Apparently earlier today therapist approach nursing stating patient thought something  Felt like it getting caught. She swallowed.  However when I later evaluated her she denied having any difficulty swallowing or feeling like anything was getting caught when she swallowed.  She also denied any chest pain or shortness of breath.  I do note she does have a history of GERD she is on Prilosec which apparently is helping-she states she has a long-term history of a hiatal hernia but this has been apparently relatively asymptomatic.  Family medical social history as been reviewed per admission note on 10/01/2013.  Medications have been reviewed per MAR.  Review of systems.  In general no complaints of fever or chills.  Respiratory no shortness of breath or cough.  Cardiac no chest pain or significant lower extremity edema.  GI hasn't history of hiatal hernia with GERD symptoms at times but denies any swallowing difficulty this afternoon as noted above-denies any abdominal pain as well nausea or vomiting.  Physical exam.  Temperature is 96.8 pulse 60 respirations 18 blood pressure 137/62.  In general this is a somewhat frail pleasant elderly female in no distress sitting comfortably in her wheelchair.  Her skin is warm and dry.  Oropharynx is clear mucous membranes moist tongue is midline.  Chest is clear to auscultation no labored breathing.  Heart is regular rate and rhythm without murmur bowel or rub she has minimal lower extremity edema.  Labs.  10/01/2013.  WBC 8.1  hemoglobin 11.1 platelets 179.  Sodium 140 potassium 4.4 BUN 30 creatinine 0.95.  Assessment and plan.  #1- dysphasia? At this point she denies this-Will continue to monitor-I do note she does have a history of GERD and is on Prilosec which apparently is effective again we'll have to monitor for any possible occurrence reoccurrence-.  #2-mildly elevated BUN I see Dr. Sheppard Coil has recommended pushing fluids Will update metabolic panel this week as well very  #3-history of coronary artery disease with aortic valve replacement-she is not complaining of any chest pain she is on aspirin for anticoagulation this is a tissue valve.  Also continues on Coreg Lasix with her history of CHF this appears to be compensated--again we'll update a metabolic panel  [id number]

## 2013-10-20 ENCOUNTER — Telehealth: Payer: Self-pay

## 2013-10-20 ENCOUNTER — Encounter: Payer: Self-pay | Admitting: Internal Medicine

## 2013-10-20 ENCOUNTER — Non-Acute Institutional Stay (SKILLED_NURSING_FACILITY): Payer: Medicare Other | Admitting: Internal Medicine

## 2013-10-20 DIAGNOSIS — K219 Gastro-esophageal reflux disease without esophagitis: Secondary | ICD-10-CM

## 2013-10-20 DIAGNOSIS — E119 Type 2 diabetes mellitus without complications: Secondary | ICD-10-CM

## 2013-10-20 DIAGNOSIS — Z954 Presence of other heart-valve replacement: Secondary | ICD-10-CM

## 2013-10-20 DIAGNOSIS — I2510 Atherosclerotic heart disease of native coronary artery without angina pectoris: Secondary | ICD-10-CM

## 2013-10-20 DIAGNOSIS — M545 Low back pain, unspecified: Secondary | ICD-10-CM

## 2013-10-20 DIAGNOSIS — Z952 Presence of prosthetic heart valve: Secondary | ICD-10-CM

## 2013-10-20 DIAGNOSIS — I482 Chronic atrial fibrillation, unspecified: Secondary | ICD-10-CM

## 2013-10-20 DIAGNOSIS — I4891 Unspecified atrial fibrillation: Secondary | ICD-10-CM

## 2013-10-20 DIAGNOSIS — E039 Hypothyroidism, unspecified: Secondary | ICD-10-CM

## 2013-10-20 DIAGNOSIS — I10 Essential (primary) hypertension: Secondary | ICD-10-CM

## 2013-10-20 NOTE — Telephone Encounter (Signed)
Yes that is fine

## 2013-10-20 NOTE — Telephone Encounter (Signed)
Barbara Garner with [facility] left v/m; has referral for home health for pt and wants to verify Dr Glori Bickers will be the doctor to sign home health orders.Please advise.

## 2013-10-20 NOTE — Progress Notes (Signed)
Patient ID: Barbara Garner, female   DOB: [birth year], 79 y.o.   MRN: [id number]   this is a discharge note.  Level of care skilled.  Facility AF.   Chief Complaint   Patient presents with   .   discharge note   HPI: Patient is 79 y.o. female who was w/u in hospital for LBP which was neg so probably herniated disk; admitted OT/PT.--Apparently she has progressed fairly well will need a rolling walker for her ambulation however.  She will be going home with family there will be someone with her apparently 24 hours a day her son is with her in the room today and he has been here with her fairly constantly  She is receiving Vicodin for pain control which apparently is effectiv  will need continued PT and OT for strengthening as well as nursing support for her medical issues including hypertension tissue valve replacement.--Type 2 diabetes Afib.follow up--these have been relatively stable during her stay here  Her blood sugars appear to be somewhat variable 90s to mid 100s generally in the morning.  At noon sugars are quite variable ranging from 100 to the high 200s generally-at 4 PM also  variability from the 100-300 range--at at bedtime ranges from 138 to the higher 200s generally  I did see her recently -- nursing staff stated that she was complaining of something catching in her throat-however she denied it when I saw her this has not been an issue apparently since then.  She does have a history of GERD she continues on Prilosec-apparently with some history of hiatal hernia in the past.    Past Medical History   Diagnosis  Date   .  Iron deficiency anemia, unspecified    .  Chronic diastolic CHF (congestive heart failure)    .  CAD S/P CABG x 3  08/2011     LIMA to diagonal branch, SVG to OM1, SVG to PDA, EVH via bilateral thighs   .  Type II or unspecified type diabetes mellitus without mention of complication, not stated as uncontrolled    .  Contact dermatitis and other eczema, due to  unspecified cause    .  Esophageal reflux    .  Headache(784.0)    .  Pacemaker MDT dual chamber      DOI 08/2011   .  Pure hypercholesterolemia    .  hypertension    .  Hypothyroidism    .  Atrioventricular block, complete -intermittent    .  Memory loss    .  Idiopathic peripheral neuropathy    .  LBBB (left bundle branch block)    .  Carotid stenosis      Carotid dopplers [id number] with [id number] LICA stenosis   .  Severe aortic stenosis -S/P AVR      19 mm [facility] Ease pericardial tissue valve   .  Extrinsic asthma, unspecified      hasn't used inhaler in past year   .  CKD (chronic kidney disease) stage 4, GFR 15-29 ml/min      Cr 1.6 in 6/11, sees Dr. Arty Baumgartner   .  Impaired vision      pt. reports that she identifies her meds by looking at the pillls, she is not able to read labels on bottles    Past Surgical History   Procedure  Laterality  Date   .  Ptca   [phone]     5 blockages   .  Cardiac  catheterization     .  Esophageal dilation   1997   .  Colonoscopy   04/1999     1 polyp   .  Esophagogastroduodenoscopy   04/1999     HH; "watermelon stomach" (severe gastritis)   .  Dexa   10/2003 ,09/2005     osteoporosis, Osteopenia   .  Cataract extraction, bilateral   2003   .  Esophagogastroduodenoscopy   04/2002     Gastritis   .  Cardiovascular stress test   10/2002     normal (per patient)   .  Abdominal hysterectomy   1977     partial; fibroids   .  Bladder tack   1977   .  Colonoscopy   06/2004     Neg. Int hem   .  Carotid dopplers   2006   .  Refractive surgery   2006   .  Opthy   11/1998;12/01;11/02   .  Aortic valve replacement   09/06/2011     Procedure: AORTIC VALVE REPLACEMENT (AVR); Surgeon: Rexene Alberts, MD; Location: [place]; Service: Open Heart Surgery; Laterality: N/A;   .  Coronary artery bypass graft   09/06/2011     Procedure: CORONARY ARTERY BYPASS GRAFTING (CABG); Surgeon: Rexene Alberts, MD; Location: [facility]; Service: Open Heart Surgery; Laterality:  N/A;   .  Colonoscopy   08/2013      Medication List                     aspirin 81 MG EC tablet    Take 1 tablet (81 mg total) by mouth daily.    atorvastatin 40 MG tablet    Commonly known as: LIPITOR    Take 40 mg by mouth daily.    carvedilol 12.5 MG tablet    Commonly known as: COREG    Take 12.5 mg by mouth 2 (two) times daily with a meal.    fluticasone 50 MCG/ACT nasal spray    Commonly known as: FLONASE    Place 2 sprays into both nostrils daily.    furosemide 40 MG tablet    Commonly known as: LASIX    Take 1 tablet (40 mg total) by mouth daily.    glipiZIDE 10 MG 24 hr tablet    Commonly known as: GLUCOTROL XL    Take 10 mg by mouth 2 (two) times daily.    HYDROcodone-acetaminophen 5-325 MG per tablet    Commonly known as: NORCO/VICODIN    Take 1 tablet by mouth every 4 (four) hours as needed for moderate pain.    levothyroxine 50 MCG tablet    Commonly known as: SYNTHROID, LEVOTHROID    Take 50 mcg by mouth daily before breakfast.    omeprazole 20 MG capsule    Commonly known as: PRILOSEC    Take 20 mg by mouth daily.    OVER THE COUNTER MEDICATION    Apply 1 application topically daily as needed (for stiffness in neck). "Muscle Reliever cream"    Potassium Chloride ER 20 MEQ Tbcr    Take 20 mEq by mouth daily. Take when taking lasix.    Vitamin D-3 1000 UNITS Caps    Take 1 capsule by mouth daily.     No orders of the defined types were placed in this encounter.  Immunization History   Administered  Date(s) Administered   .  Influenza Split  05/11/2011   .  Influenza Whole  12/18/2005,  01/20/2009, 01/05/2010   .  Influenza,inj,Quad PF,36+ Mos  11/26/2012   .  Pneumococcal Polysaccharide-23  08/27/2008   .  Td  05/18/2005    History   Substance Use Topics   .  Smoking status:  Never Smoker   .  Smokeless tobacco:  Never Used   .  Alcohol Use:  No   Family history is noncontributory  Review of Systems  DATA OBTAINED: from patient  GENERAL: no  fevers, fatigue, appetite changes  SKIN: No itching, rash or wounds  EYES: No eye pain, redness, discharge  EARS: No earache, tinnitus, change in hearing  NOSE: No congestion, drainage or bleeding  MOUTH/THROAT: No mouth or tooth pain,  RESPIRATORY: No cough, wheezing, SOB  CARDIAC: No chest pain, palpitations, lower extremity edema  GI: No abdominal pain, No N/V/D or constipation, No heartburn or reflux noted above GU: No dysuria, frequency or urgency, or incontinence  MUSCULOSKELETAL: No unrelieved bone/joint pain --doing well with rolling walker NEUROLOGIC: No headache, dizziness or focal weakness  PSYCHIATRIC: No overt anxiety or sadness. Sleeps well.                    Physical Exam  Temperature 96.4 pulse 60 respirations 16 blood pressure 138/61-110/70 in this range GENERAL APPEARANCE: Alert, conversant. Appropriately groomed. No acute distress.  SKIN: No diaphoresis rash  HEAD: Normocephalic, atraumatic  EYES: Conjunctiva/lids clear. Pupils round, reactive. EOMs intact.  EARS: External exam WNL, canals clear. Hearing grossly normal.  NOSE: No deformity or discharge.  MOUTH/THROAT: Lips w/o lesions.  RESPIRATORY: Breathing is even, unlabored. Lung sounds are clear  CARDIOVASCULAR: Heart RRR with some irregular beats no murmurs, rubs or gallops. No peripheral edema.  GASTROINTESTINAL: Abdomen is soft, non-tender, not distended w/ normal bowel sounds  GENITOURINARY: Bladder non tender, not distended  MUSCULOSKELETAL: No abnormal joints or musculature is able to rise from a sitting position without assistance and ambulates quite well with a rolling walker NEUROLOGIC: Cranial nerves 2-12 grossly intact. Moves all extremities no tremor.  PSYCHIATRIC: Mood and affect appropriate to situation, no behavioral issues  Patient Active Problem List    Diagnosis  Date Noted   .  Leukocytosis, unspecified  10/01/2013   .  Low back pain  09/24/2013   .  Medication administered in error   09/23/2013   .  HCAP (healthcare-associated pneumonia)  09/16/2013   .  Acute and chronic respiratory failure  08/09/2013   .  Acute respiratory failure  08/09/2013   .  Urge incontinence of urine  07/16/2013   .  Urine incontinence  02/07/2013   .  Urinary frequency  02/07/2013   .  UTI (urinary tract infection)  02/07/2013   .  Sinus bradycardia  09/24/2012   .  Vitamin D deficiency disease  08/23/2012   .  Lichen sclerosus et atrophicus of the vulva  04/05/2012   .  Acute back pain  03/22/2012   .  Chronic diastolic CHF (congestive heart failure)  11/15/2011   .  Pacemaker-[employer]  09/13/2011   .  Atrial fibrillation  09/12/2011   .  Complete heart block-intermittent  09/09/2011   .  S/P aortic valve replacement  09/06/2011   .  S/P CABG x 3  09/06/2011   .  IVCD (intraventricular conduction defect)    .  CKD (chronic kidney disease) stage 4, GFR 15-29 ml/min    .  Carotid stenosis  05/30/2011   .  Squamous cell carcinoma of skin  07/21/2010   .  INTERTRIGO, CANDIDAL  03/24/2010   .  MEMORY LOSS  08/19/2009   .  HYPERCHOLESTEROLEMIA, PURE  09/20/2006   .  HYPOTHYROIDISM  06/27/2006   .  DIABETES MELLITUS, TYPE II  06/27/2006   .  ANEMIA-IRON DEFICIENCY  06/27/2006   .  PERIPHERAL NEUROPATHY  06/27/2006   .  HYPERTENSION  06/27/2006   .  MYOCARDIAL INFARCTION, HX OF  06/27/2006   .  CORONARY ARTERY DISEASE  06/27/2006   .  ASTHMA  06/27/2006   .  GERD  06/27/2006   .  PNEUMONIA, HX OF  06/27/2006   .  ECZEMA  06/27/2006     Labs.  L October 09 2013.  Sodium 140 potassium 3.7 BUN 39 creatinine 1.1  09/30/2013.  WBC 8.1 hemoglobin 11.1 platelets 179.  Sodium 140 potassium 4.4 BUN 30 creatinine 0.95     Component  Value  Date/Time    WBC  12.7*  09/25/2013 0453    RBC  3.47*  09/25/2013 0453    RBC  2.88*  08/09/2013 1115    HGB  10.4*  09/25/2013 0453    HCT  32.2*  09/25/2013 0453    PLT  134*  09/25/2013 0453    MCV  92.8  09/25/2013 0453    LYMPHSABS  1.4  09/24/2013  0425    MONOABS  2.4*  09/24/2013 0425    EOSABS  0.0  09/24/2013 0425    BASOSABS  0.0  09/24/2013 0425   CMP    Component  Value  Date/Time    NA  139  09/25/2013 0453    K  4.0  09/25/2013 0453    CL  101  09/25/2013 0453    CO2  24  09/25/2013 0453    GLUCOSE  123*  09/25/2013 0453    BUN  35*  09/25/2013 0453    CREATININE  1.56*  09/25/2013 0453    CALCIUM  7.5*  09/25/2013 0453    PROT  8.2  09/23/2013 1515    ALBUMIN  3.6  09/23/2013 1515    AST  23  09/23/2013 1515    ALT  12  09/23/2013 1515    ALKPHOS  84  09/23/2013 1515    BILITOT  0.9  09/23/2013 1515    GFRNONAA  31*  09/25/2013 0453    GFRAA  36*  09/25/2013 0453   Assessment and Plan  Low back pain  Most likely due to herniated disk. No cord compression on myelogram  - She appears to have done well with rehabilitation is ambulating well with a rolling walker does not complaining of pain does have Vicodin as needed-- Leukocytosis, unspecified  - Pan culture negative including CSF  - Imaging studies reported herniated disc  - No fevers, as such leukocytosis most likely secondary to dehydration stress reaction--her recent lab leukocytosis has resolved  Atrial fibrillation  Coreg and ASA-this appears rate controlled  Chronic diastolic CHF (congestive heart failure)  Coreg, lasix and ASA--this appears to be stable  CORONARY ARTERY DISEASE  ASA and statin--currently she appears to be asymptomatic  GERD  Continue omeprazole --at this point appears stable DIABETES MELLITUS, TYPE II  A1c 7.1; glipizide;pt on statin--variable blood sugars but before discharge would be hesitant to make medication changes will defer to primary care provider  HYPOTHYROIDISM  Synthroid 50 mcg--Will update TSH before discharge  CKD (chronic kidney disease) stage 4, GFR 15-29 ml/min  Percent creatinine is 1.1  this appears relatively stable Will update before discharge  HYPERCHOLESTEROLEMIA, PURE  Continue lipitor 40 mg--will defer followup to primary care provider since  her stay here was relatively short  S/P aortic valve replacement  ASA 81 mg only; tissue valve  Vitamin D deficiency disease  This is being repleted   Again she will be going home with family will need continued PT OT for strengthening with her history of back issues as noted above-also nursing support followup her multiple medical issues including diabetes and atrial fibrillation-.  She will need a rolling walker secondary to some continued weakness although she appears to be progressing quite well here.    [id number] note greater than 30 minutes spent on this discharge summary-scripts have been written

## 2013-10-21 NOTE — Telephone Encounter (Signed)
Mariann Laster at [facility] advised.

## 2013-10-23 ENCOUNTER — Telehealth: Payer: Self-pay

## 2013-10-23 NOTE — Telephone Encounter (Signed)
Barbara Garner with [facility] left v/m requesting verbal orders for home health PT 2 x a week for 3 weeks and 1 x a week for 1 week.Please advise.

## 2013-10-23 NOTE — Telephone Encounter (Signed)
Please ok that verbal order, thanks  

## 2013-10-23 NOTE — Telephone Encounter (Signed)
Verbal orders given to Arnold.  

## 2013-10-24 LAB — FUNGUS CULTURE W SMEAR: Fungal Smear: NONE SEEN

## 2013-10-27 ENCOUNTER — Telehealth: Payer: Self-pay | Admitting: *Deleted

## 2013-10-27 NOTE — Telephone Encounter (Signed)
Barbara Garner, OT with [facility] called to let Dr. Glori Bickers know his plane of care. Pt is going to have OT Once a weeks for 2 weeks, to help with transfer training, and home exercise, pt was evaluated by them last week and this is the plan of care they decided,  [name] doesn't need a call back he just wanted to let Dr. Glori Bickers know

## 2013-10-29 ENCOUNTER — Encounter: Payer: Self-pay | Admitting: Family Medicine

## 2013-10-29 ENCOUNTER — Ambulatory Visit (INDEPENDENT_AMBULATORY_CARE_PROVIDER_SITE_OTHER): Payer: Medicare Other | Admitting: Family Medicine

## 2013-10-29 VITALS — BP 106/62 | HR 100 | Temp 97.6°F | Ht 62.0 in | Wt 103.0 lb

## 2013-10-29 DIAGNOSIS — E119 Type 2 diabetes mellitus without complications: Secondary | ICD-10-CM

## 2013-10-29 DIAGNOSIS — R413 Other amnesia: Secondary | ICD-10-CM | POA: Insufficient documentation

## 2013-10-29 DIAGNOSIS — I10 Essential (primary) hypertension: Secondary | ICD-10-CM

## 2013-10-29 DIAGNOSIS — M545 Low back pain, unspecified: Secondary | ICD-10-CM

## 2013-10-29 DIAGNOSIS — I5032 Chronic diastolic (congestive) heart failure: Secondary | ICD-10-CM

## 2013-10-29 DIAGNOSIS — D72829 Elevated white blood cell count, unspecified: Secondary | ICD-10-CM

## 2013-10-29 DIAGNOSIS — I509 Heart failure, unspecified: Secondary | ICD-10-CM

## 2013-10-29 DIAGNOSIS — L538 Other specified erythematous conditions: Secondary | ICD-10-CM

## 2013-10-29 LAB — CBC WITH DIFFERENTIAL/PLATELET
BASOS ABS: 0.1 10*3/uL (ref 0.0–0.1)
BASOS PCT: 0.8 % (ref 0.0–3.0)
EOS ABS: 0.3 10*3/uL (ref 0.0–0.7)
Eosinophils Relative: 3.6 % (ref 0.0–5.0)
HEMATOCRIT: 36.8 % (ref 36.0–46.0)
Hemoglobin: 12 g/dL (ref 12.0–15.0)
LYMPHS ABS: 2 10*3/uL (ref 0.7–4.0)
LYMPHS PCT: 27.2 % (ref 12.0–46.0)
MCHC: 32.6 g/dL (ref 30.0–36.0)
MCV: 93.2 fl (ref 78.0–100.0)
MONO ABS: 0.6 10*3/uL (ref 0.1–1.0)
Monocytes Relative: 8.4 % (ref 3.0–12.0)
Neutro Abs: 4.4 10*3/uL (ref 1.4–7.7)
Neutrophils Relative %: 60 % (ref 43.0–77.0)
Platelets: 170 10*3/uL (ref 150.0–400.0)
RBC: 3.94 Mil/uL (ref 3.87–5.11)
RDW: 17.1 % — AB (ref 11.5–15.5)
WBC: 7.4 10*3/uL (ref 4.0–10.5)

## 2013-10-29 LAB — COMPREHENSIVE METABOLIC PANEL
ALK PHOS: 112 U/L (ref 39–117)
ALT: 13 U/L (ref 0–35)
AST: 21 U/L (ref 0–37)
Albumin: 3.8 g/dL (ref 3.5–5.2)
BUN: 52 mg/dL — AB (ref 6–23)
CALCIUM: 9.2 mg/dL (ref 8.4–10.5)
CHLORIDE: 104 meq/L (ref 96–112)
CO2: 26 mEq/L (ref 19–32)
CREATININE: 1.8 mg/dL — AB (ref 0.4–1.2)
GFR: 29.72 mL/min — ABNORMAL LOW (ref >60.00)
Glucose, Bld: 186 mg/dL — ABNORMAL HIGH (ref 70–99)
Potassium: 4.9 mEq/L (ref 3.5–5.1)
Sodium: 139 mEq/L (ref 135–145)
Total Bilirubin: 0.7 mg/dL (ref 0.2–1.2)
Total Protein: 7.5 g/dL (ref 6.0–8.3)

## 2013-10-29 LAB — VITAMIN B12: Vitamin B-12: 218 pg/mL (ref 211–911)

## 2013-10-29 LAB — SEDIMENTATION RATE: SED RATE: 48 mm/h — AB (ref 0–22)

## 2013-10-29 LAB — TSH: TSH: 0.64 u[IU]/mL (ref 0.35–4.50)

## 2013-10-29 NOTE — Progress Notes (Signed)
Subjective:    Patient ID: Barbara Garner, female    DOB: Nov 21, 1934, 79 y.o.   MRN: [id number]  HPI Here for f/u of hosp (july) and then rehab stay   Has been home now over a week    hosp for MS change and LBP and leukocytosis  Dx with disc disease/ had a myelogram (no cord compression) and tx with vicodin  Wbc elevation-no cause found All cx incl CSF came back normal  Lab Results  Component Value Date   WBC 12.7* 09/25/2013   HGB 10.4* 09/25/2013   HCT 32.2* 09/25/2013   MCV 92.8 09/25/2013   PLT 134* 09/25/2013     Chronic problems were stable   Adm to rehab for OT and PT   DM monitored-labile glucose Lab Results  Component Value Date   HGBA1C 7.1* 09/24/2013    bp is stable today  No cp or palpitations or headaches or edema  No side effects to medicines  BP Readings from Last 3 Encounters:  10/29/13 106/62  10/20/13 138/61  10/06/13 137/62     afib and CHF stable    Today- pt feels better  Back pain is controlled  The hydrodocone does not seem to make her sleepy or dizzy or off balance  Has a rolling walker - unless someone is here to support her  Doing some walking outdoors each day   No more medicine confusion or overdose   She does c/o of rash under L breast - used neosporin and that did not help   Has not been checking glucose with son's machine this am - was 79 Needs glucose equiptment  Appetite is good / eats small portions , also a diabetic diet   Family notices a decrease in short term memory for at least 6 mo  Cannot remember recent events  Asks questions constantly  Denies confusion however  Does misplace items  Family does note she is struggling to breathe at night - only when lying down - orthopnea  Also sits up at night to breathe No pedal edema ? If this is her CHF No sob on exertion and no cp F/u with cardiol is at the end of sept   Patient Active Problem List   Diagnosis Date Noted  . Memory loss, short term 10/29/2013  .  Leukocytosis, unspecified 10/01/2013  . Low back pain 09/24/2013  . Medication administered in error 09/23/2013  . HCAP (healthcare-associated pneumonia) 09/16/2013  . Acute and chronic respiratory failure 08/09/2013  . Acute respiratory failure 08/09/2013  . Urge incontinence of urine 07/16/2013  . Urine incontinence 02/07/2013  . Urinary frequency 02/07/2013  . UTI (urinary tract infection) 02/07/2013  . Sinus bradycardia 09/24/2012  . Vitamin D deficiency disease 08/23/2012  . Lichen sclerosus et atrophicus of the vulva 04/05/2012  . Acute back pain 03/22/2012  . Chronic diastolic CHF (congestive heart failure) 11/15/2011  . Pacemaker-Medtronic 09/13/2011  . Atrial fibrillation 09/12/2011  . Complete heart block-intermittent 09/09/2011  . S/P aortic valve replacement 09/06/2011  . S/P CABG x 3 09/06/2011  . IVCD (intraventricular conduction defect)   . CKD (chronic kidney disease) stage 4, GFR 15-29 ml/min   . Carotid stenosis 05/30/2011  . Squamous cell carcinoma of skin 07/21/2010  . INTERTRIGO, CANDIDAL 03/24/2010  . MEMORY LOSS 08/19/2009  . HYPERCHOLESTEROLEMIA, PURE 09/20/2006  . HYPOTHYROIDISM 06/27/2006  . DIABETES MELLITUS, TYPE II 06/27/2006  . ANEMIA-IRON DEFICIENCY 06/27/2006  . PERIPHERAL NEUROPATHY 06/27/2006  . HYPERTENSION 06/27/2006  .  MYOCARDIAL INFARCTION, HX OF 06/27/2006  . CORONARY ARTERY DISEASE 06/27/2006  . ASTHMA 06/27/2006  . GERD 06/27/2006  . PNEUMONIA, HX OF 06/27/2006  . ECZEMA 06/27/2006   Past Medical History  Diagnosis Date  . Iron deficiency anemia, unspecified   . Chronic diastolic CHF (congestive heart failure)   . CAD  S/P CABG x 3 08/2011     LIMA to diagonal branch, SVG to OM1, SVG to PDA, EVH via bilateral thighs  . Type II or unspecified type diabetes mellitus without mention of complication, not stated as uncontrolled   . Contact dermatitis and other eczema, due to unspecified cause   . Esophageal reflux   . Headache(784.0)    . Pacemaker MDT dual chamber     DOI 08/2011  . Pure hypercholesterolemia   . hypertension   . Hypothyroidism   . Atrioventricular block, complete -intermittent   . Memory loss   . Idiopathic peripheral neuropathy   . LBBB (left bundle branch block)   . Carotid stenosis     Carotid dopplers 0000000 with [id number] LICA stenosis  . Severe aortic stenosis -S/P AVR      19 mm [facility] Ease pericardial tissue valve  . Extrinsic asthma, unspecified     hasn't used inhaler in past year  . CKD (chronic kidney disease) stage 4, GFR 15-29 ml/min     Cr 1.6 in 6/11, sees Dr. Arty Baumgartner  . Impaired vision     pt. reports that she identifies her meds by looking at the pillls, she is not able to read labels on bottles   Past Surgical History  Procedure Laterality Date  . Ptca  [phone]    5 blockages  . Cardiac catheterization    . Esophageal dilation  1997  . Colonoscopy  04/1999    1 polyp  . Esophagogastroduodenoscopy  04/1999    HH; "watermelon stomach" (severe gastritis)  . Dexa  10/2003 ,09/2005    osteoporosis,  Osteopenia  . Cataract extraction, bilateral  2003  . Esophagogastroduodenoscopy  04/2002    Gastritis  . Cardiovascular stress test  10/2002    normal (per patient)  . Abdominal hysterectomy  1977    partial; fibroids  . Bladder tack  1977  . Colonoscopy  06/2004    Neg. Int hem  . Carotid dopplers  2006  . Refractive surgery  2006  . Opthy  11/1998;12/01;11/02  . Aortic valve replacement  09/06/2011    Procedure: AORTIC VALVE REPLACEMENT (AVR);  Surgeon: Rexene Alberts, MD;  Location: [place];  Service: Open Heart Surgery;  Laterality: N/A;  . Coronary artery bypass graft  09/06/2011    Procedure: CORONARY ARTERY BYPASS GRAFTING (CABG);  Surgeon: Rexene Alberts, MD;  Location: [place];  Service: Open Heart Surgery;  Laterality: N/A;  . Colonoscopy  08/2013   History  Substance Use Topics  . Smoking status: Never Smoker   . Smokeless tobacco: Never Used  . Alcohol Use: No    Family History  Problem Relation Age of Onset  . Stroke Father     died in his 35's  . Stroke Mother     died in her 72's   Allergies  Allergen Reactions  . Metformin     REACTION: intolerant  . Promethazine Hcl     REACTION: u/k   Current Outpatient Prescriptions on File Prior to Visit  Medication Sig Dispense Refill  . aspirin EC 81 MG EC tablet Take 1 tablet (81 mg total) by  mouth daily.  30 tablet  3  . atorvastatin (LIPITOR) 40 MG tablet Take 40 mg by mouth daily.      . carvedilol (COREG) 12.5 MG tablet Take 12.5 mg by mouth 2 (two) times daily with a meal.       . Cholecalciferol (VITAMIN D-3) 1000 UNITS CAPS Take 1 capsule by mouth daily.      . fluticasone (FLONASE) 50 MCG/ACT nasal spray Place 2 sprays into both nostrils daily.       . furosemide (LASIX) 40 MG tablet Take 1 tablet (40 mg total) by mouth daily.  90 tablet  1  . glipiZIDE (GLUCOTROL XL) 10 MG 24 hr tablet Take 10 mg by mouth every morning.       Marland Kitchen HYDROcodone-acetaminophen (NORCO/VICODIN) 5-325 MG per tablet Take 1 tablet by mouth every 4 (four) hours as needed for moderate pain.  180 tablet  0  . levothyroxine (SYNTHROID, LEVOTHROID) 50 MCG tablet Take 50 mcg by mouth daily before breakfast.      . omeprazole (PRILOSEC) 20 MG capsule Take 20 mg by mouth daily.      Marland Kitchen OVER THE COUNTER MEDICATION Apply 1 application topically daily as needed (for stiffness in neck). "Muscle Reliever cream"      . potassium chloride 20 MEQ TBCR Take 20 mEq by mouth daily. Take when taking lasix.  30 tablet  0   No current facility-administered medications on file prior to visit.     Review of Systems Review of Systems  Constitutional: Negative for fever, appetite change, and unexpected weight change.  Eyes: Negative for pain and visual disturbance.  Respiratory: Negative for cough and pos  shortness of breath.(when lying down)   Cardiovascular: Negative for cp or palpitations pos for orthopnea and PND     Gastrointestinal: Negative for nausea, diarrhea and constipation.  Genitourinary: Negative for urgency and frequency.  Skin: Negative for pallor or rash   Neurological: Negative for weakness, light-headedness, numbness and headaches.  Hematological: Negative for adenopathy. Does not bruise/bleed easily.  Psychiatric/Behavioral: Negative for dysphoric mood. The patient is occ nervous/anxious.  pos for short term memory problems        Objective:   Physical Exam  Constitutional: She appears well-developed and well-nourished. No distress.  Frail appearing elderly female   HENT:  Head: Normocephalic and atraumatic.  Mouth/Throat: Oropharynx is clear and moist.  Eyes: Conjunctivae and EOM are normal. Pupils are equal, round, and reactive to light. No scleral icterus.  Neck: Normal range of motion. Neck supple. No JVD present. Carotid bruit is not present. No thyromegaly present.  Cardiovascular: Normal rate, regular rhythm and intact distal pulses.  Exam reveals no gallop.   Murmur heard. Pulmonary/Chest: Effort normal. No respiratory distress. She has no wheezes. She has no rales. She exhibits no tenderness.  No crackles   Abdominal: Soft. Bowel sounds are normal. She exhibits no distension, no abdominal bruit and no mass. There is no tenderness.  Musculoskeletal: Normal range of motion. She exhibits no edema and no tenderness.  Lymphadenopathy:    She has no cervical adenopathy.  Neurological: She is alert. She has normal reflexes. No cranial nerve deficit. She exhibits normal muscle tone. Coordination normal.  Skin: Skin is warm and dry. Rash noted. There is erythema. No pallor.  Very fair complexion- baseline  Erythema with sharp borders under L breast - resembling intertrigo  Psychiatric: She has a normal mood and affect. Her speech is normal and behavior is normal. Her mood appears  not anxious. Her affect is not blunt and not labile. Cognition and memory are impaired. She does not  exhibit a depressed mood. She exhibits abnormal recent memory.          Assessment & Plan:   Problem List Items Addressed This Visit     Cardiovascular and Mediastinum   HYPERTENSION - Primary      bp in fair control at this time  BP Readings from Last 1 Encounters:  10/29/13 106/62  on coreg  No changes needed Disc lifstyle change with low sodium diet and exercise   Lab today     Relevant Orders      CBC with Differential (Completed)      Comprehensive metabolic panel (Completed)      TSH (Completed)   Chronic diastolic CHF (congestive heart failure)     Pt exp worse PND and orthopnea  Reassuring exam today  Will try to move up her cardiology appt    Relevant Orders      Ambulatory referral to Cardiology     Endocrine   DIABETES MELLITUS, TYPE II      Lab Results  Component Value Date   HGBA1C 7.1* 09/24/2013   Will watch glucose at home on her regular diet /nl routine  Enc her to stay active  Re check a1c in 2 mo  They will call back with brand of glucose supplies they need       Musculoskeletal and Integument   INTERTRIGO, CANDIDAL     Under breasts  Will use antifungal cream and update      Other   Low back pain     Rev hospital records incl myelogram Herniated disc noted  Not surgical candidate  Using norco- no problems/no falls /watching carefully      Leukocytosis, unspecified     Re check this today  No s/s of infection     Relevant Orders      CBC with Differential (Completed)   Memory loss, short term     Lab today Then f/u for MMS exam and further discussion  She is interested in changing primary care to closer location ([facility] office when it opens) - due to her recent move     Relevant Orders      TSH (Completed)      Sedimentation Rate (Completed)      Vitamin B12 (Completed)

## 2013-10-29 NOTE — Progress Notes (Signed)
Pre visit review using our clinic review tool, if applicable. No additional management support is needed unless otherwise documented below in the visit note. 

## 2013-10-29 NOTE — Patient Instructions (Signed)
Use an antifungal cream or powder for your rash  Lab today for blood count/ kidney function and for memory  Please discuss a transfer to the new [place] office when it opens - she will need a visit for memory loss Stop at checkout also for referral for earlier cardiology appt

## 2013-10-30 NOTE — Assessment & Plan Note (Signed)
Rev hospital records incl myelogram Herniated disc noted  Not surgical candidate  Using norco- no problems/no falls /watching carefully

## 2013-10-30 NOTE — Assessment & Plan Note (Signed)
Lab today Then f/u for MMS exam and further discussion  She is interested in changing primary care to closer location ([facility] office when it opens) - due to her recent move

## 2013-10-30 NOTE — Assessment & Plan Note (Signed)
Lab Results  Component Value Date   HGBA1C 7.1* 09/24/2013   Will watch glucose at home on her regular diet /nl routine  Enc her to stay active  Re check a1c in 2 mo  They will call back with brand of glucose supplies they need

## 2013-10-30 NOTE — Assessment & Plan Note (Signed)
Re check this today  No s/s of infection

## 2013-10-30 NOTE — Assessment & Plan Note (Signed)
Under breasts  Will use antifungal cream and update

## 2013-10-30 NOTE — Assessment & Plan Note (Signed)
bp in fair control at this time  BP Readings from Last 1 Encounters:  10/29/13 106/62  on coreg  No changes needed Disc lifstyle change with low sodium diet and exercise   Lab today

## 2013-10-30 NOTE — Assessment & Plan Note (Signed)
Pt exp worse PND and orthopnea  Reassuring exam today  Will try to move up her cardiology appt

## 2013-10-31 ENCOUNTER — Encounter: Payer: No Typology Code available for payment source | Admitting: Family Medicine

## 2013-11-03 DIAGNOSIS — M6281 Muscle weakness (generalized): Secondary | ICD-10-CM

## 2013-11-03 DIAGNOSIS — I10 Essential (primary) hypertension: Secondary | ICD-10-CM

## 2013-11-03 DIAGNOSIS — R262 Difficulty in walking, not elsewhere classified: Secondary | ICD-10-CM

## 2013-11-03 DIAGNOSIS — E119 Type 2 diabetes mellitus without complications: Secondary | ICD-10-CM

## 2013-11-04 ENCOUNTER — Encounter: Payer: Self-pay | Admitting: *Deleted

## 2013-11-05 ENCOUNTER — Telehealth: Payer: Self-pay

## 2013-11-05 NOTE — Telephone Encounter (Signed)
Px written and in IN box

## 2013-11-05 NOTE — Telephone Encounter (Signed)
Rx faxed to pharmacy, and left voicemail letting pt's daughter know Rx sent

## 2013-11-05 NOTE — Telephone Encounter (Signed)
Barbara Garner left v/m; pt needs lifescan meter, lifescan test strips and lancets. [facility] [address] [place], [place]. Helene Kelp request cb.

## 2013-11-06 NOTE — Telephone Encounter (Signed)
Received fax saying there was a few issues with the origional Rx sent, pharmacy faxed over a letter and info of what needs to be changed on Rx, form placed in your inbox

## 2013-11-07 NOTE — Telephone Encounter (Signed)
Done

## 2013-11-07 NOTE — Telephone Encounter (Signed)
New Rxs faxed

## 2013-11-11 NOTE — Telephone Encounter (Signed)
Please find out what METER brand is covered and let me know -or at least some options  Does that brand also need to be included for strips and lancets ?

## 2013-11-11 NOTE — Telephone Encounter (Addendum)
Received voicemail from [place] with [facility] pharmacy, she said that the Rxs were almost correct but there is two things that need to be corrected. Life Scan isn't a meter it's a manufacture  so they need to know exactly which meter do you want them to give her also they need you to date the Rxs beside your signature for insurance purposes, Rxs placed back in your inbox with the letter that they had sent you

## 2013-11-12 ENCOUNTER — Ambulatory Visit (INDEPENDENT_AMBULATORY_CARE_PROVIDER_SITE_OTHER): Payer: Medicare Other | Admitting: Physician Assistant

## 2013-11-12 ENCOUNTER — Encounter: Payer: Self-pay | Admitting: Physician Assistant

## 2013-11-12 VITALS — BP 130/58 | HR 66 | Ht 62.0 in | Wt 106.0 lb

## 2013-11-12 DIAGNOSIS — I6529 Occlusion and stenosis of unspecified carotid artery: Secondary | ICD-10-CM

## 2013-11-12 DIAGNOSIS — I5032 Chronic diastolic (congestive) heart failure: Secondary | ICD-10-CM

## 2013-11-12 DIAGNOSIS — E7800 Pure hypercholesterolemia, unspecified: Secondary | ICD-10-CM

## 2013-11-12 DIAGNOSIS — I2510 Atherosclerotic heart disease of native coronary artery without angina pectoris: Secondary | ICD-10-CM

## 2013-11-12 DIAGNOSIS — N184 Chronic kidney disease, stage 4 (severe): Secondary | ICD-10-CM

## 2013-11-12 DIAGNOSIS — I059 Rheumatic mitral valve disease, unspecified: Secondary | ICD-10-CM

## 2013-11-12 DIAGNOSIS — I359 Nonrheumatic aortic valve disorder, unspecified: Secondary | ICD-10-CM

## 2013-11-12 DIAGNOSIS — I6523 Occlusion and stenosis of bilateral carotid arteries: Secondary | ICD-10-CM

## 2013-11-12 DIAGNOSIS — Z950 Presence of cardiac pacemaker: Secondary | ICD-10-CM

## 2013-11-12 DIAGNOSIS — Z8709 Personal history of other diseases of the respiratory system: Secondary | ICD-10-CM

## 2013-11-12 DIAGNOSIS — R0602 Shortness of breath: Secondary | ICD-10-CM

## 2013-11-12 DIAGNOSIS — D509 Iron deficiency anemia, unspecified: Secondary | ICD-10-CM

## 2013-11-12 DIAGNOSIS — I658 Occlusion and stenosis of other precerebral arteries: Secondary | ICD-10-CM

## 2013-11-12 DIAGNOSIS — I10 Essential (primary) hypertension: Secondary | ICD-10-CM

## 2013-11-12 DIAGNOSIS — I509 Heart failure, unspecified: Secondary | ICD-10-CM

## 2013-11-12 LAB — BASIC METABOLIC PANEL
BUN: 56 mg/dL — ABNORMAL HIGH (ref 6–23)
CO2: 29 mEq/L (ref 19–32)
CREATININE: 1.7 mg/dL — AB (ref 0.4–1.2)
Calcium: 8.7 mg/dL (ref 8.4–10.5)
Chloride: 98 mEq/L (ref 96–112)
GFR: 30.31 mL/min — ABNORMAL LOW (ref >60.00)
Glucose, Bld: 179 mg/dL — ABNORMAL HIGH (ref 70–99)
Potassium: 4.7 mEq/L (ref 3.5–5.1)
Sodium: 137 mEq/L (ref 135–145)

## 2013-11-12 LAB — BRAIN NATRIURETIC PEPTIDE: PRO B NATRI PEPTIDE: 85 pg/mL (ref 0.0–100.0)

## 2013-11-12 NOTE — Telephone Encounter (Signed)
Done and in IN box - AGAIN  I am going to forward this to Vincente Liberty also so she can see what we had to go through to get this done -I want her to be aware

## 2013-11-12 NOTE — Telephone Encounter (Signed)
Rx's faxed to pharmacy

## 2013-11-12 NOTE — Progress Notes (Signed)
Cardiology Office Note    Date:  11/12/2013   ID:  Garner, Barbara 1935-03-06, MRN [id number]  PCP:  Loura Pardon, MD  Cardiologist:  Dr. Loralie Champagne   Electrophysiologist:  Dr. Virl Axe    History of Present Illness: Barbara Garner is a 79 y.o. female with a hx of CAD and aortic stenosis s/p CABG + bioprosthetic AVR in 2013, CHB s/p PPM, diastolic CHF, LBBB, HTN, CKD.  Admitted several times over this year.  She was admitted in [id number] with a/c diastolic CHF and bronchitis.  Diuresis was c/b AKI.  She developed worsening anemia requiring transfusion with PRBCs.  FU colonoscopy demonstrated diverticulosis and EGD demonstrated gastritis.  Last seen in this office by Ermalinda Barrios, PA-C 08/2013.  She was admitted twice in July.  One admission was for severe back pain thought to be due to HNP.  She was referred to PT.  Pan-cultures (including CSF) were negative in setting of leukocytosis.  She was also admitted for a/c diastolic CHF in the setting of pneumonia.  She was seen by PCP earlier this month.  She complained of orthopnea and FU was arranged today.    She is here today with her son and daughter.  She stays with her son.  They help with the hx due to dementia.  Her son notes that she was having some difficulty with breathing while laying flat.  There was also a couple episodes of PND.  But, this has improved over the past few weeks.  She does go for walks and denies significant DOE.  She is NYHA 2b.  She denies chest pain or syncope.  Denies increased abdominal girth.  Denies cough.     Studies:  - Echo (5/15):  EF 60-65%, Gr 2 DD, AVR ok (mean 9 mmHg), MAC, mild MS (mean 6 mmHg), mild to mod MR, mod LAE  - Carotid US (6/15):  Bilateral ICA 40-59%   Recent Labs/Images: 09/16/2013: Pro B Natriuretic peptide (BNP) 11854.0*  10/29/2013: ALT 13; Creatinine 1.8*; Hemoglobin 12.0; Potassium 4.9; TSH 0.64    Wt Readings from Last 3 Encounters:  11/12/13 106 lb (48.081 kg)  10/29/13  103 lb (46.72 kg)  09/29/13 102 lb 4.8 oz (46.403 kg)    Past Medical History:  1. Anemia-iron deficiency  2. Asthma  3. Coronary artery disease: Unstable angina in 1997 with PTCA of large D1. Myoview (9/10) at [facility] cardiology showed EF 84%, stable perfusion images with no ischemia. LHC (5/13) showed 95% OM1 stenosis, 75% mLAD, 80% ostial large D1, 75% dRCA. She had LIMA-LAD, SVG-PDA, and SVG-OM at time of AVR in 6/13.  4. Diabetes mellitus, type II  5. GERD  6. Hypertension  7. Hypothyroidism  8. Pneumonia, hx of  9. CKD: creatinine 1.6 in 6/11  10. Chronic LBBB  11. Aortic stenosis: Severe. Echo (3/13): EF 55-65%, moderately LV hypertrophy, severe AS with mean gradient 40 mmHg/peak gradient 70 mmHg, PA systolic pressure 35 mmHg. Patient had bioprosthetic AVR in 6/13. Echo post-op in 6/13 showed mild LVH, EF 65-70%, mean gradient 14 mmHg across bioprosthetic aortic valve.  12. Carotid stenosis: Carotid dopplers 0000000 with [id number] LICA stenosis. Carotid dopplers (3/13) with 40-59% bilateral carotid stenosis.  13. Post-op complete heart block in 6/13 with Medtronic dual chamber PPM.    Past Medical History  Diagnosis Date  . Iron deficiency anemia, unspecified   . Chronic diastolic CHF (congestive heart failure)   . CAD  S/P CABG x 3 08/2011  LIMA to diagonal branch, SVG to OM1, SVG to PDA, EVH via bilateral thighs  . Type II or unspecified type diabetes mellitus without mention of complication, not stated as uncontrolled   . Contact dermatitis and other eczema, due to unspecified cause   . Esophageal reflux   . Headache(784.0)   . Pacemaker MDT dual chamber     DOI 08/2011  . Pure hypercholesterolemia   . hypertension   . Hypothyroidism   . Atrioventricular block, complete -intermittent   . Memory loss   . Idiopathic peripheral neuropathy   . LBBB (left bundle branch block)   . Carotid stenosis     Carotid dopplers 0000000 with [id number] LICA stenosis  . Severe aortic stenosis -S/P  AVR      19 mm [facility] Ease pericardial tissue valve  . Extrinsic asthma, unspecified     hasn't used inhaler in past year  . CKD (chronic kidney disease) stage 4, GFR 15-29 ml/min     Cr 1.6 in 6/11, sees Dr. Arty Baumgartner  . Impaired vision     pt. reports that she identifies her meds by looking at the pillls, she is not able to read labels on bottles    Current Outpatient Prescriptions  Medication Sig Dispense Refill  . aspirin EC 81 MG EC tablet Take 1 tablet (81 mg total) by mouth daily.  30 tablet  3  . atorvastatin (LIPITOR) 40 MG tablet Take 40 mg by mouth daily.      . carvedilol (COREG) 12.5 MG tablet Take 12.5 mg by mouth 2 (two) times daily with a meal.       . Cholecalciferol (VITAMIN D-3) 1000 UNITS CAPS Take 1 capsule by mouth daily.      . fluticasone (FLONASE) 50 MCG/ACT nasal spray Place 2 sprays into both nostrils daily.       . furosemide (LASIX) 40 MG tablet Take 1 tablet (40 mg total) by mouth daily.  90 tablet  1  . glipiZIDE (GLUCOTROL XL) 10 MG 24 hr tablet Take 10 mg by mouth every morning.       Marland Kitchen HYDROcodone-acetaminophen (NORCO/VICODIN) 5-325 MG per tablet Take 1 tablet by mouth every 4 (four) hours as needed for moderate pain.  180 tablet  0  . levothyroxine (SYNTHROID, LEVOTHROID) 50 MCG tablet Take 50 mcg by mouth daily before breakfast.      . omeprazole (PRILOSEC) 20 MG capsule Take 20 mg by mouth daily.      . potassium chloride 20 MEQ TBCR Take 20 mEq by mouth daily. Take when taking lasix.  30 tablet  0   No current facility-administered medications for this visit.     Allergies:   Metformin and Promethazine hcl   Social History:  The patient  reports that she has never smoked. She has never used smokeless tobacco. She reports that she does not drink alcohol or use illicit drugs.   Family History:  The patient's family history includes Heart attack in her paternal grandfather; Stroke in her father and mother.   ROS:  Please see the history of  present illness.   She does take frequent naps.  She denies hx of snoring.  No melena, hematochezia, hematuria.     All other systems reviewed and negative.   PHYSICAL EXAM: VS:  BP 130/58  Pulse 66  Ht 5\' 2"  (1.575 m)  Wt 106 lb (48.081 kg)  BMI 19.38 kg/m2 Well nourished, well developed, in no acute distress HEENT: normal Neck: no  JVD Cardiac:  normal S1, S2; RRR; no murmur Lungs:  Decreased breath sounds bilaterally, no wheezing, rhonchi or rales Abd: soft, nontender, no hepatomegaly Ext: no edema Skin: warm and dry Neuro:  CNs 2-12 intact, no focal abnormalities noted  EKG:  A paced, HR 66, LBBB     ASSESSMENT AND PLAN:  Chronic diastolic CHF (congestive heart failure):  She has had 2 admissions for diastolic CHF in the past 3 months.  She did have increased shortness of breath at night up until recently.  Her weights are up just slightly.  She does not weigh at home.  She does admit to drinking several soft drinks a day (high salt load).  She does not appear to be volume overloaded today.  I will obtain a FU BMET and BNP.  If her BNP is elevated, I will increase her Lasix.  We discussed the importance of daily weights, when to take extra Lasix and when to call.   Atherosclerosis of native coronary artery of native heart without angina pectoris:  No angina.  Continue ASA, statin, beta blocker.   Essential hypertension:  Controlled.   Carotid stenosis, bilateral:  Recent dopplers stable.  Repeat in 1 year.   Pacemaker:  FU with EP as planned.   Aortic valve disorders:  Recent echo stable with well functioning AVR.  Continue SBE prophylaxis.    Pure hypercholesterolemia:  Continue statin.   Mitral valve disorder:  Mild MS and mild to mod MR by recent echo.  Consider repeat echo in 1 year.   CKD (chronic kidney disease) stage 4, GFR 15-29 ml/min:  Recent creatinines stable.  She is supposed to FU with nephrology.    ANEMIA-IRON DEFICIENCY:  Recent Hgb stable.   PNEUMONIA,  HX OF:  No clear hx of aspiration.   Disposition:  Keep FU with Dr. Loralie Champagne next month as planned.    Signed, Versie Starks, MHS 11/12/2013 1:16 PM    [facility] [place], [place], [place]  [address] Phone: [phone]; Fax: [phone]

## 2013-11-12 NOTE — Patient Instructions (Signed)
Your physician recommends that you continue on your current medications as directed. Please refer to the Current Medication list given to you today.  LAB WORK TODAY BMET, BNP  KEEP YOUR APPT WITH DR. Aundra Dubin IN 11/2013

## 2013-11-12 NOTE — Telephone Encounter (Signed)
Pharmacy said that accu-check aviva plus is the meter covered, they would need 3 separate Rxs for the meter, test strips, and lancets. They Rx would need to say accu check aviva plus and have the dx code and the instructions and the date beside your signature, and NPI on Rx

## 2013-11-13 ENCOUNTER — Telehealth: Payer: Self-pay | Admitting: *Deleted

## 2013-11-13 NOTE — Telephone Encounter (Signed)
pt's son [name]) notified about lab results with verbla understanding

## 2013-11-14 ENCOUNTER — Ambulatory Visit (INDEPENDENT_AMBULATORY_CARE_PROVIDER_SITE_OTHER): Payer: Medicare Other | Admitting: Physician Assistant

## 2013-11-14 ENCOUNTER — Encounter: Payer: Self-pay | Admitting: Physician Assistant

## 2013-11-14 VITALS — BP 120/64 | HR 66 | Temp 97.7°F | Resp 14 | Ht 62.0 in | Wt 105.0 lb

## 2013-11-14 DIAGNOSIS — F0390 Unspecified dementia without behavioral disturbance: Secondary | ICD-10-CM

## 2013-11-14 DIAGNOSIS — R946 Abnormal results of thyroid function studies: Secondary | ICD-10-CM

## 2013-11-14 DIAGNOSIS — R7989 Other specified abnormal findings of blood chemistry: Secondary | ICD-10-CM

## 2013-11-14 DIAGNOSIS — B354 Tinea corporis: Secondary | ICD-10-CM

## 2013-11-14 LAB — FOLATE: Folate: 12.4 ng/mL

## 2013-11-14 LAB — CBC
HCT: 33.9 % — ABNORMAL LOW (ref 36.0–46.0)
HEMOGLOBIN: 11.5 g/dL — AB (ref 12.0–15.0)
MCH: 30.7 pg (ref 26.0–34.0)
MCHC: 33.9 g/dL (ref 30.0–36.0)
MCV: 90.4 fL (ref 78.0–100.0)
PLATELETS: 262 10*3/uL (ref 150–400)
RBC: 3.75 MIL/uL — AB (ref 3.87–5.11)
RDW: 15.7 % — ABNORMAL HIGH (ref 11.5–15.5)
WBC: 6.7 10*3/uL (ref 4.0–10.5)

## 2013-11-14 LAB — TSH: TSH: 0.144 u[IU]/mL — ABNORMAL LOW (ref 0.350–4.500)

## 2013-11-14 LAB — VITAMIN B12: Vitamin B-12: 315 pg/mL (ref 211–911)

## 2013-11-14 MED ORDER — HYDROCODONE-ACETAMINOPHEN 5-325 MG PO TABS: 1.0000 | ORAL_TABLET | Freq: Four times a day (QID) | ORAL | Status: DC | PRN

## 2013-11-14 MED ORDER — NYSTATIN 100000 UNIT/GM EX POWD: Freq: Two times a day (BID) | CUTANEOUS | Status: DC

## 2013-11-14 MED ORDER — ASPIRIN 81 MG PO TBEC: 81.0000 mg | DELAYED_RELEASE_TABLET | Freq: Every day | ORAL | Status: AC

## 2013-11-14 MED ORDER — CLOTRIMAZOLE 2 % VA CREA: 1.0000 | TOPICAL_CREAM | Freq: Every day | VAGINAL | Status: DC

## 2013-11-14 MED ORDER — GLUCOSE BLOOD VI STRP: ORAL_STRIP | Status: DC

## 2013-11-14 MED ORDER — ONETOUCH ULTRA MINI W/DEVICE KIT: PACK | Status: DC

## 2013-11-14 MED ORDER — ONETOUCH ULTRASOFT LANCETS MISC: Status: DC

## 2013-11-14 NOTE — Progress Notes (Signed)
Pre visit review using our clinic review tool, if applicable. No additional management support is needed unless otherwise documented below in the visit note/SLS  

## 2013-11-14 NOTE — Patient Instructions (Signed)
Please obtain labs.  I will call you with your results.  Take current medications as directed.  Check blood sugar each morning.  Write this number down.  The glucose should be between 70-120.  Do not skip meals.  Bring this to your follow-up in 3-4 weeks.  For yeast -- Use Nystatin powder topically to skin.  Apply Clotrimazole vaginally for 3 nights.

## 2013-11-15 LAB — RPR

## 2013-11-15 LAB — SEDIMENTATION RATE: Sed Rate: 14 mm/hr (ref 0–22)

## 2013-11-17 ENCOUNTER — Other Ambulatory Visit: Payer: Self-pay | Admitting: *Deleted

## 2013-11-17 DIAGNOSIS — E119 Type 2 diabetes mellitus without complications: Secondary | ICD-10-CM

## 2013-11-17 MED ORDER — ONETOUCH DELICA LANCETS FINE MISC: Status: DC

## 2013-11-18 ENCOUNTER — Telehealth: Payer: Self-pay | Admitting: Physician Assistant

## 2013-11-18 DIAGNOSIS — E119 Type 2 diabetes mellitus without complications: Secondary | ICD-10-CM

## 2013-11-18 MED ORDER — GLUCOSE BLOOD VI STRP: ORAL_STRIP | Status: DC

## 2013-11-18 MED ORDER — ONETOUCH DELICA LANCETS FINE MISC: Status: DC

## 2013-11-18 NOTE — Telephone Encounter (Signed)
Diabetic supplies resent to pharmacy.

## 2013-11-19 ENCOUNTER — Other Ambulatory Visit: Payer: Self-pay | Admitting: Physician Assistant

## 2013-11-19 DIAGNOSIS — E119 Type 2 diabetes mellitus without complications: Secondary | ICD-10-CM

## 2013-11-19 MED ORDER — GLUCOSE BLOOD VI STRP: ORAL_STRIP | Status: DC

## 2013-11-19 MED ORDER — ONETOUCH DELICA LANCETS FINE MISC: Status: DC

## 2013-11-21 NOTE — Progress Notes (Signed)
Patient presents to clinic today with her son and daughter-in law (caretakers) to establish care.  Family members wish to discuss possible yeast rash under breast and axilla.  Also concern for vulvovaginal candidiasis. Endorse applying topical Nystatin powder with good result.  Patient denies dysuria, urinary urgency or frequency.  Does endorse some occasional pruritus.   Family also wishes to begin testing for dementia. Endorse patient is having difficulty with short and long-term memory. Denies depression or alcohol consumption.  Past Medical History  Diagnosis Date  . Iron deficiency anemia, unspecified   . Chronic diastolic CHF (congestive heart failure)   . CAD  S/P CABG x 3 08/2011     LIMA to diagonal branch, SVG to OM1, SVG to PDA, EVH via bilateral thighs  . Type II or unspecified type diabetes mellitus without mention of complication, not stated as uncontrolled   . Contact dermatitis and other eczema, due to unspecified cause   . Esophageal reflux   . Headache(784.0)   . Pacemaker MDT dual chamber     DOI 08/2011  . Pure hypercholesterolemia   . hypertension   . Hypothyroidism   . Atrioventricular block, complete -intermittent   . Memory loss   . Idiopathic peripheral neuropathy   . LBBB (left bundle branch block)   . Carotid stenosis     Carotid dopplers 3/66 with 44% LICA stenosis  . Severe aortic stenosis -S/P AVR      19 mm [facility] Ease pericardial tissue valve  . Extrinsic asthma, unspecified     hasn't used inhaler in past year  . CKD (chronic kidney disease) stage 4, GFR 15-29 ml/min     Cr 1.6 in 6/11, sees Dr. Arty Baumgartner  . Impaired vision     pt. reports that she identifies her meds by looking at the pillls, she is not able to read labels on bottles    Past Surgical History  Procedure Laterality Date  . Ptca  [phone]    5 blockages  . Cardiac catheterization    . Esophageal dilation  1997  . Colonoscopy  04/1999    1 polyp  .  Esophagogastroduodenoscopy  04/1999    HH; "watermelon stomach" (severe gastritis)  . Dexa  10/2003 ,09/2005    osteoporosis,  Osteopenia  . Cataract extraction, bilateral  2003  . Esophagogastroduodenoscopy  04/2002    Gastritis  . Cardiovascular stress test  10/2002    normal (per patient)  . Abdominal hysterectomy  1977    partial; fibroids  . Bladder tack  1977  . Colonoscopy  06/2004    Neg. Int hem  . Carotid dopplers  2006  . Refractive surgery  2006  . Opthy  11/1998;12/01;11/02  . Aortic valve replacement  09/06/2011    Procedure: AORTIC VALVE REPLACEMENT (AVR);  Surgeon: Rexene Alberts, MD;  Location: [place];  Service: Open Heart Surgery;  Laterality: N/A;  . Coronary artery bypass graft  09/06/2011    Procedure: CORONARY ARTERY BYPASS GRAFTING (CABG);  Surgeon: Rexene Alberts, MD;  Location: [place];  Service: Open Heart Surgery;  Laterality: N/A;  . Colonoscopy  08/2013    Current Outpatient Prescriptions on File Prior to Visit  Medication Sig Dispense Refill  . atorvastatin (LIPITOR) 40 MG tablet Take 40 mg by mouth daily.      . Cholecalciferol (VITAMIN D-3) 1000 UNITS CAPS Take 1 capsule by mouth daily.      . fluticasone (FLONASE) 50 MCG/ACT nasal spray Place 2 sprays into  both nostrils daily.       . furosemide (LASIX) 40 MG tablet Take 1 tablet (40 mg total) by mouth daily.  90 tablet  1  . glipiZIDE (GLUCOTROL XL) 10 MG 24 hr tablet Take 10 mg by mouth every morning.       Marland Kitchen omeprazole (PRILOSEC) 20 MG capsule Take 20 mg by mouth daily.       No current facility-administered medications on file prior to visit.    Allergies  Allergen Reactions  . Metformin     REACTION: intolerant  . Promethazine Hcl     REACTION: u/k    Family History  Problem Relation Age of Onset  . Stroke Father     died in his 40's  . Stroke Mother     died in her 50's  . Diabetes Father   . Prostate cancer Son   . Diabetes Son     #2  . Hypertension Son     #3  . Thyroid disease  Neg Hx     History   Social History  . Marital Status: Widowed    Spouse Name: N/A    Number of Children: 3  . Years of Education: N/A   Occupational History  . Retired    Social History Main Topics  . Smoking status: Never Smoker   . Smokeless tobacco: Never Used  . Alcohol Use: No  . Drug Use: No  . Sexual Activity: No   Other Topics Concern  . Not on file   Social History Narrative   Widowed.  Lives with son in [place].            ROS See HPI. All other ROS are negative.  BP 120/64  Pulse 66  Temp(Src) 97.7 F (36.5 C) (Oral)  Resp 14  Ht '5\' 2"'  (1.575 m)  Wt 105 lb (47.628 kg)  BMI 19.20 kg/m2  SpO2 96%  Physical Exam  Vitals reviewed. Constitutional: She is well-developed, well-nourished, and in no distress.  Cardiovascular: Normal rate, regular rhythm, normal heart sounds and intact distal pulses.   Pulmonary/Chest: Effort normal and breath sounds normal. No respiratory distress. She has no wheezes. She has no rales. She exhibits no tenderness.  Neurological: She is alert.  Skin: Skin is warm and dry.  Erythematous and scaling rash underneath breasts bilaterally, bilateral axilla, and under abdominal pannus.   Psychiatric: Affect normal.    Recent Results (from the past 2160 hour(s))  POCT URINALYSIS DIPSTICK     Status: None   Collection Time    09/23/13  3:11 PM      Result Value Ref Range   Color, UA yellow     Clarity, UA hazy     Glucose, UA neg.     Bilirubin, UA neg.     Ketones, UA Small     Spec Grav, UA 1.010     Blood, UA Trace     pH, UA 6.5     Protein, UA 100++     Urobilinogen, UA 0.2     Nitrite, UA neg.     Leukocytes, UA Negative    COMPREHENSIVE METABOLIC PANEL     Status: Abnormal   Collection Time    09/23/13  3:15 PM      Result Value Ref Range   Sodium 136  135 - 145 mEq/L   Potassium 4.6  3.5 - 5.1 mEq/L   Chloride 94 (*) 96 - 112 mEq/L   CO2 28  19 -  32 mEq/L   Glucose, Bld 152 (*) 70 - 99 mg/dL   BUN  48 (*) 6 - 23 mg/dL   Creatinine, Ser 2.4 (*) 0.4 - 1.2 mg/dL   Total Bilirubin 0.9  0.2 - 1.2 mg/dL   Alkaline Phosphatase 84  39 - 117 U/L   AST 23  0 - 37 U/L   ALT 12  0 - 35 U/L   Total Protein 8.2  6.0 - 8.3 g/dL   Albumin 3.6  3.5 - 5.2 g/dL   Calcium 8.3 (*) 8.4 - 10.5 mg/dL   GFR 21.29 (*) >60.00 mL/min  CBC WITH DIFFERENTIAL     Status: Abnormal   Collection Time    09/23/13  3:15 PM      Result Value Ref Range   WBC 18.5 Repeated and verified X2. (*) 4.0 - 10.5 K/uL   RBC 4.09  3.87 - 5.11 Mil/uL   Hemoglobin 12.4  12.0 - 15.0 g/dL   HCT 37.7  36.0 - 46.0 %   MCV 92.0  78.0 - 100.0 fl   MCHC 33.0  30.0 - 36.0 g/dL   RDW 18.3 (*) 11.5 - 15.5 %   Platelets 176.0  150.0 - 400.0 K/uL   Neutrophils Relative % 82.7 (*) 43.0 - 77.0 %   Lymphocytes Relative 7.5 (*) 12.0 - 46.0 %   Monocytes Relative 9.6  3.0 - 12.0 %   Eosinophils Relative 0.0  0.0 - 5.0 %   Basophils Relative 0.2  0.0 - 3.0 %   Neutro Abs 15.2 (*) 1.4 - 7.7 K/uL   Lymphs Abs 1.4  0.7 - 4.0 K/uL   Monocytes Absolute 1.8 (*) 0.1 - 1.0 K/uL   Eosinophils Absolute 0.0  0.0 - 0.7 K/uL   Basophils Absolute 0.0  0.0 - 0.1 K/uL  URINE CULTURE     Status: None   Collection Time    09/23/13  4:11 PM      Result Value Ref Range   Colony Count NO GROWTH     Organism ID, Bacteria NO GROWTH    CBC WITH DIFFERENTIAL     Status: Abnormal   Collection Time    09/24/13  4:25 AM      Result Value Ref Range   WBC 17.1 (*) 4.0 - 10.5 K/uL   RBC 4.01  3.87 - 5.11 MIL/uL   Hemoglobin 12.1  12.0 - 15.0 g/dL   HCT 36.0  36.0 - 46.0 %   MCV 89.8  78.0 - 100.0 fL   MCH 30.2  26.0 - 34.0 pg   MCHC 33.6  30.0 - 36.0 g/dL   RDW 16.1 (*) 11.5 - 15.5 %   Platelets 174  150 - 400 K/uL   Neutrophils Relative % 78 (*) 43 - 77 %   Neutro Abs 13.4 (*) 1.7 - 7.7 K/uL   Lymphocytes Relative 8 (*) 12 - 46 %   Lymphs Abs 1.4  0.7 - 4.0 K/uL   Monocytes Relative 14 (*) 3 - 12 %   Monocytes Absolute 2.4 (*) 0.1 - 1.0 K/uL    Eosinophils Relative 0  0 - 5 %   Eosinophils Absolute 0.0  0.0 - 0.7 K/uL   Basophils Relative 0  0 - 1 %   Basophils Absolute 0.0  0.0 - 0.1 K/uL  LACTIC ACID, PLASMA     Status: None   Collection Time    09/24/13  4:25 AM      Result Value Ref  Range   Lactic Acid, Venous 1.2  0.5 - 2.2 mmol/L  SEDIMENTATION RATE     Status: Abnormal   Collection Time    09/24/13  4:25 AM      Result Value Ref Range   Sed Rate 89 (*) 0 - 22 mm/hr  HEMOGLOBIN A1C     Status: Abnormal   Collection Time    09/24/13  4:25 AM      Result Value Ref Range   Hemoglobin A1C 7.1 (*) <5.7 %   Comment: (NOTE)                                                                               According to the ADA Clinical Practice Recommendations for 2011, when     HbA1c is used as a screening test:      >=6.5%   Diagnostic of Diabetes Mellitus               (if abnormal result is confirmed)     5.7-6.4%   Increased risk of developing Diabetes Mellitus     References:Diagnosis and Classification of Diabetes Mellitus,Diabetes     VXBL,3903,00(PQZRA 1):S62-S69 and Standards of Medical Care in             Diabetes - 2011,Diabetes Care,2011,34 (Suppl 1):S11-S61.   Mean Plasma Glucose 157 (*) <117 mg/dL   Comment: Performed at [facility], ED     Status: Abnormal   Collection Time    09/24/13  4:29 AM      Result Value Ref Range   Sodium 136 (*) 137 - 147 mEq/L   Potassium 3.2 (*) 3.7 - 5.3 mEq/L   Chloride 95 (*) 96 - 112 mEq/L   BUN 41 (*) 6 - 23 mg/dL   Creatinine, Ser 2.20 (*) 0.50 - 1.10 mg/dL   Glucose, Bld 82  70 - 99 mg/dL   Calcium, Ion 0.90 (*) 1.13 - 1.30 mmol/L   TCO2 26  0 - 100 mmol/L   Hemoglobin 13.9  12.0 - 15.0 g/dL   HCT 41.0  36.0 - 46.0 %  URINALYSIS, ROUTINE W REFLEX MICROSCOPIC     Status: Abnormal   Collection Time    09/24/13  4:39 AM      Result Value Ref Range   Color, Urine YELLOW  YELLOW   APPearance CLEAR  CLEAR   Specific Gravity, Urine 1.013  1.005  - 1.030   pH 6.5  5.0 - 8.0   Glucose, UA NEGATIVE  NEGATIVE mg/dL   Hgb urine dipstick NEGATIVE  NEGATIVE   Bilirubin Urine NEGATIVE  NEGATIVE   Ketones, ur 15 (*) NEGATIVE mg/dL   Protein, ur 100 (*) NEGATIVE mg/dL   Urobilinogen, UA 1.0  0.0 - 1.0 mg/dL   Nitrite NEGATIVE  NEGATIVE   Leukocytes, UA NEGATIVE  NEGATIVE  URINE MICROSCOPIC-ADD ON     Status: Abnormal   Collection Time    09/24/13  4:39 AM      Result Value Ref Range   Squamous Epithelial / LPF FEW (*) RARE   WBC, UA 0-2  <3 WBC/hpf   RBC / HPF 0-2  <3 RBC/hpf  Bacteria, UA RARE  RARE   Casts HYALINE CASTS (*) NEGATIVE  I-STAT TROPOININ, ED     Status: None   Collection Time    09/24/13  5:04 AM      Result Value Ref Range   Troponin i, poc 0.01  0.00 - 0.08 ng/mL   Comment 3            Comment: Due to the release kinetics of cTnI,     a negative result within the first hours     of the onset of symptoms does not rule out     myocardial infarction with certainty.     If myocardial infarction is still suspected,     repeat the test at appropriate intervals.  I-STAT CG4 LACTIC ACID, ED     Status: None   Collection Time    09/24/13  5:06 AM      Result Value Ref Range   Lactic Acid, Venous 1.38  0.5 - 2.2 mmol/L  CBG MONITORING, ED     Status: None   Collection Time    09/24/13  6:09 AM      Result Value Ref Range   Glucose-Capillary 82  70 - 99 mg/dL  CULTURE, BLOOD (ROUTINE X 2)     Status: None   Collection Time    09/24/13  7:28 AM      Result Value Ref Range   Specimen Description BLOOD LEFT ANTECUBITAL     Special Requests BOTTLES DRAWN AEROBIC AND ANAEROBIC 10CCS     Culture  Setup Time       Value: 09/24/2013 12:39     Performed at [facility]   Culture       Value: NO GROWTH 5 DAYS     Performed at [facility]   Report Status 09/30/2013 FINAL    CULTURE, BLOOD (ROUTINE X 2)     Status: None   Collection Time    09/24/13  7:34 AM      Result Value Ref Range   Specimen  Description BLOOD LEFT HAND     Special Requests BOTTLES DRAWN AEROBIC ONLY 5CC     Culture  Setup Time       Value: 09/24/2013 12:39     Performed at [facility]   Culture       Value: NO GROWTH 5 DAYS     Performed at [facility]   Report Status 09/30/2013 FINAL    CREATININE, URINE, RANDOM     Status: None   Collection Time    09/24/13 11:20 AM      Result Value Ref Range   Creatinine, Urine 78.46    OSMOLALITY, URINE     Status: Abnormal   Collection Time    09/24/13 11:20 AM      Result Value Ref Range   Osmolality, Ur 381 (*) 390 - 1090 mOsm/kg   Comment: Performed at [facility]  SODIUM, URINE, RANDOM     Status: None   Collection Time    09/24/13 11:20 AM      Result Value Ref Range   Sodium, Ur <20     Comment: REPEATED TO VERIFY  URINE CULTURE     Status: None   Collection Time    09/24/13 11:20 AM      Result Value Ref Range   Specimen Description URINE, CATHETERIZED     Special Requests NONE     Culture  Setup Time  Value: 09/24/2013 11:45     Performed at [facility]       Value: NO GROWTH     Performed at [facility]   Culture       Value: NO GROWTH     Performed at [facility]   Report Status 09/25/2013 FINAL    URINALYSIS, ROUTINE W REFLEX MICROSCOPIC     Status: Abnormal   Collection Time    09/24/13 11:20 AM      Result Value Ref Range   Color, Urine YELLOW  YELLOW   APPearance CLEAR  CLEAR   Specific Gravity, Urine 1.012  1.005 - 1.030   pH 6.0  5.0 - 8.0   Glucose, UA NEGATIVE  NEGATIVE mg/dL   Hgb urine dipstick NEGATIVE  NEGATIVE   Bilirubin Urine NEGATIVE  NEGATIVE   Ketones, ur 15 (*) NEGATIVE mg/dL   Protein, ur 100 (*) NEGATIVE mg/dL   Urobilinogen, UA 1.0  0.0 - 1.0 mg/dL   Nitrite NEGATIVE  NEGATIVE   Leukocytes, UA NEGATIVE  NEGATIVE  URINE MICROSCOPIC-ADD ON     Status: Abnormal   Collection Time    09/24/13 11:20 AM      Result Value Ref Range    Squamous Epithelial / LPF RARE  RARE   WBC, UA 0-2  <3 WBC/hpf   RBC / HPF 0-2  <3 RBC/hpf   Casts HYALINE CASTS (*) NEGATIVE  OSMOLALITY     Status: None   Collection Time    09/24/13 11:29 AM      Result Value Ref Range   Osmolality 294  275 - 300 mOsm/kg   Comment: Performed at [facility]  TSH     Status: None   Collection Time    09/24/13 11:29 AM      Result Value Ref Range   TSH 2.360  0.350 - 4.500 uIU/mL  URINE RAPID DRUG SCREEN (HOSP PERFORMED)     Status: None   Collection Time    09/24/13 11:57 AM      Result Value Ref Range   Opiates NONE DETECTED  NONE DETECTED   Cocaine NONE DETECTED  NONE DETECTED   Benzodiazepines NONE DETECTED  NONE DETECTED   Amphetamines NONE DETECTED  NONE DETECTED   Tetrahydrocannabinol NONE DETECTED  NONE DETECTED   Barbiturates NONE DETECTED  NONE DETECTED   Comment:            DRUG SCREEN FOR MEDICAL PURPOSES     ONLY.  IF CONFIRMATION IS NEEDED     FOR ANY PURPOSE, NOTIFY LAB     WITHIN 5 DAYS.                LOWEST DETECTABLE LIMITS     FOR URINE DRUG SCREEN     Drug Class       Cutoff (ng/mL)     Amphetamine      1000     Barbiturate      200     Benzodiazepine   250     Tricyclics       539     Opiates          300     Cocaine          300     THC              50  GLUCOSE, CAPILLARY     Status: Abnormal   Collection Time  09/24/13 12:09 PM      Result Value Ref Range   Glucose-Capillary 152 (*) 70 - 99 mg/dL  LIPASE, BLOOD     Status: None   Collection Time    09/24/13  2:12 PM      Result Value Ref Range   Lipase 18  11 - 59 U/L  GLUCOSE, CAPILLARY     Status: Abnormal   Collection Time    09/24/13  5:22 PM      Result Value Ref Range   Glucose-Capillary 124 (*) 70 - 99 mg/dL  GLUCOSE, CAPILLARY     Status: Abnormal   Collection Time    09/24/13 10:00 PM      Result Value Ref Range   Glucose-Capillary 61 (*) 70 - 99 mg/dL  GLUCOSE, CAPILLARY     Status: None   Collection Time    09/24/13 10:36 PM       Result Value Ref Range   Glucose-Capillary 80  70 - 99 mg/dL  BASIC METABOLIC PANEL     Status: Abnormal   Collection Time    09/25/13  4:53 AM      Result Value Ref Range   Sodium 139  137 - 147 mEq/L   Potassium 4.0  3.7 - 5.3 mEq/L   Comment: DELTA CHECK NOTED   Chloride 101  96 - 112 mEq/L   CO2 24  19 - 32 mEq/L   Glucose, Bld 123 (*) 70 - 99 mg/dL   BUN 35 (*) 6 - 23 mg/dL   Creatinine, Ser 1.56 (*) 0.50 - 1.10 mg/dL   Calcium 7.5 (*) 8.4 - 10.5 mg/dL   GFR calc non Af Amer 31 (*) >90 mL/min   GFR calc Af Amer 36 (*) >90 mL/min   Comment: (NOTE)     The eGFR has been calculated using the CKD EPI equation.     This calculation has not been validated in all clinical situations.     eGFR's persistently <90 mL/min signify possible Chronic Kidney     Disease.   Anion gap 14  5 - 15  CBC     Status: Abnormal   Collection Time    09/25/13  4:53 AM      Result Value Ref Range   WBC 12.7 (*) 4.0 - 10.5 K/uL   RBC 3.47 (*) 3.87 - 5.11 MIL/uL   Hemoglobin 10.4 (*) 12.0 - 15.0 g/dL   Comment: REPEATED TO VERIFY   HCT 32.2 (*) 36.0 - 46.0 %   MCV 92.8  78.0 - 100.0 fL   MCH 30.0  26.0 - 34.0 pg   MCHC 32.3  30.0 - 36.0 g/dL   RDW 16.3 (*) 11.5 - 15.5 %   Platelets 134 (*) 150 - 400 K/uL  GLUCOSE, CAPILLARY     Status: Abnormal   Collection Time    09/25/13  7:59 AM      Result Value Ref Range   Glucose-Capillary 107 (*) 70 - 99 mg/dL  CSF CELL COUNT WITH DIFFERENTIAL     Status: Abnormal   Collection Time    09/25/13 12:20 PM      Result Value Ref Range   Tube # 1     Color, CSF PINK (*) COLORLESS   Appearance, CSF HAZY (*) CLEAR   Supernatant COLORLESS     RBC Count, CSF 2825 (*) 0 /cu mm   WBC, CSF 3  0 - 5 /cu mm   Segmented Neutrophils-CSF TOO FEW TO  COUNT, SMEAR AVAILABLE FOR REVIEW  0 - 6 %   Comment: FEW   Lymphs, CSF RARE  40 - 80 %  PROTEIN AND GLUCOSE, CSF     Status: Abnormal   Collection Time    09/25/13 12:20 PM      Result Value Ref Range    Glucose, CSF 77 (*) 43 - 76 mg/dL   Total  Protein, CSF 41  15 - 45 mg/dL  CSF CULTURE     Status: None   Collection Time    09/25/13 12:20 PM      Result Value Ref Range   Specimen Description CSF     Special Requests 4.5ML CSF FLUID     Gram Stain       Value: WBC PRESENT,BOTH PMN AND MONONUCLEAR     NO ORGANISMS SEEN     Performed at [facility] CYTOSPIN     Performed at [facility]   Culture       Value: NO GROWTH 3 DAYS     Performed at [facility]   Report Status 09/28/2013 FINAL    CRYPTOCOCCAL ANTIGEN, CSF     Status: None   Collection Time    09/25/13 12:20 PM      Result Value Ref Range   Crypto Ag NEGATIVE  NEGATIVE   Cryptococcal Ag Titer NOT INDICATED  NOT INDICATED  FUNGUS CULTURE W SMEAR     Status: None   Collection Time    09/25/13 12:20 PM      Result Value Ref Range   Specimen Description CSF     Special Requests 3.0ML CSF FLUID     Fungal Smear       Value: NO YEAST OR FUNGAL ELEMENTS SEEN     Performed at [facility]   Culture       Value: No Fungi Isolated in 4 Weeks     Performed at [facility]   Report Status 10/24/2013 FINAL    GRAM STAIN     Status: None   Collection Time    09/25/13 12:20 PM      Result Value Ref Range   Specimen Description CSF     Special Requests 4.5MLS FLUID     Gram Stain       Value: CYTOSPIN PREP     SCANT PMN AND MONONUCLEAR WBC PRESENT     NO ORGANISMS SEEN   Report Status 09/25/2013 FINAL    GLUCOSE, CAPILLARY     Status: Abnormal   Collection Time    09/25/13  2:18 PM      Result Value Ref Range   Glucose-Capillary 153 (*) 70 - 99 mg/dL  GLUCOSE, CAPILLARY     Status: Abnormal   Collection Time    09/25/13  4:37 PM      Result Value Ref Range   Glucose-Capillary 151 (*) 70 - 99 mg/dL  GLUCOSE, CAPILLARY     Status: Abnormal   Collection Time    09/25/13  9:26 PM      Result Value Ref Range   Glucose-Capillary 198 (*) 70 - 99 mg/dL  GLUCOSE, CAPILLARY      Status: None   Collection Time    09/26/13  7:38 AM      Result Value Ref Range   Glucose-Capillary 91  70 - 99 mg/dL   Comment 1 Notify RN    GLUCOSE, CAPILLARY     Status: Abnormal   Collection Time  09/26/13 11:41 AM      Result Value Ref Range   Glucose-Capillary 182 (*) 70 - 99 mg/dL   Comment 1 Notify RN    GLUCOSE, CAPILLARY     Status: Abnormal   Collection Time    09/26/13  9:38 PM      Result Value Ref Range   Glucose-Capillary 150 (*) 70 - 99 mg/dL  GLUCOSE, CAPILLARY     Status: Abnormal   Collection Time    09/27/13  7:45 AM      Result Value Ref Range   Glucose-Capillary 130 (*) 70 - 99 mg/dL   Comment 1 Documented in Chart     Comment 2 Notify RN    GLUCOSE, CAPILLARY     Status: Abnormal   Collection Time    09/27/13 11:34 AM      Result Value Ref Range   Glucose-Capillary 121 (*) 70 - 99 mg/dL   Comment 1 Documented in Chart     Comment 2 Notify RN    GLUCOSE, CAPILLARY     Status: Abnormal   Collection Time    09/27/13  4:45 PM      Result Value Ref Range   Glucose-Capillary 162 (*) 70 - 99 mg/dL   Comment 1 Documented in Chart     Comment 2 Notify RN    GLUCOSE, CAPILLARY     Status: Abnormal   Collection Time    09/27/13  9:04 PM      Result Value Ref Range   Glucose-Capillary 150 (*) 70 - 99 mg/dL  GLUCOSE, CAPILLARY     Status: Abnormal   Collection Time    09/28/13  7:38 AM      Result Value Ref Range   Glucose-Capillary 130 (*) 70 - 99 mg/dL  GLUCOSE, CAPILLARY     Status: Abnormal   Collection Time    09/28/13 11:42 AM      Result Value Ref Range   Glucose-Capillary 158 (*) 70 - 99 mg/dL  GLUCOSE, CAPILLARY     Status: None   Collection Time    09/28/13  4:54 PM      Result Value Ref Range   Glucose-Capillary 87  70 - 99 mg/dL  GLUCOSE, CAPILLARY     Status: Abnormal   Collection Time    09/28/13  8:36 PM      Result Value Ref Range   Glucose-Capillary 143 (*) 70 - 99 mg/dL  GLUCOSE, CAPILLARY     Status: Abnormal   Collection  Time    09/29/13  8:15 AM      Result Value Ref Range   Glucose-Capillary 148 (*) 70 - 99 mg/dL  GLUCOSE, CAPILLARY     Status: Abnormal   Collection Time    09/29/13 11:33 AM      Result Value Ref Range   Glucose-Capillary 180 (*) 70 - 99 mg/dL   Comment 1 Documented in Chart     Comment 2 Notify RN    GLUCOSE, CAPILLARY     Status: Abnormal   Collection Time    09/29/13  4:44 PM      Result Value Ref Range   Glucose-Capillary 157 (*) 70 - 99 mg/dL  CBC WITH DIFFERENTIAL     Status: Abnormal   Collection Time    10/29/13  2:55 PM      Result Value Ref Range   WBC 7.4  4.0 - 10.5 K/uL   RBC 3.94  3.87 - 5.11 Mil/uL  Hemoglobin 12.0  12.0 - 15.0 g/dL   HCT 36.8  36.0 - 46.0 %   MCV 93.2  78.0 - 100.0 fl   MCHC 32.6  30.0 - 36.0 g/dL   RDW 17.1 (*) 11.5 - 15.5 %   Platelets 170.0  150.0 - 400.0 K/uL   Neutrophils Relative % 60.0  43.0 - 77.0 %   Lymphocytes Relative 27.2  12.0 - 46.0 %   Monocytes Relative 8.4  3.0 - 12.0 %   Eosinophils Relative 3.6  0.0 - 5.0 %   Basophils Relative 0.8  0.0 - 3.0 %   Neutro Abs 4.4  1.4 - 7.7 K/uL   Lymphs Abs 2.0  0.7 - 4.0 K/uL   Monocytes Absolute 0.6  0.1 - 1.0 K/uL   Eosinophils Absolute 0.3  0.0 - 0.7 K/uL   Basophils Absolute 0.1  0.0 - 0.1 K/uL  COMPREHENSIVE METABOLIC PANEL     Status: Abnormal   Collection Time    10/29/13  2:55 PM      Result Value Ref Range   Sodium 139  135 - 145 mEq/L   Potassium 4.9  3.5 - 5.1 mEq/L   Chloride 104  96 - 112 mEq/L   CO2 26  19 - 32 mEq/L   Glucose, Bld 186 (*) 70 - 99 mg/dL   BUN 52 (*) 6 - 23 mg/dL   Creatinine, Ser 1.8 (*) 0.4 - 1.2 mg/dL   Total Bilirubin 0.7  0.2 - 1.2 mg/dL   Alkaline Phosphatase 112  39 - 117 U/L   AST 21  0 - 37 U/L   ALT 13  0 - 35 U/L   Total Protein 7.5  6.0 - 8.3 g/dL   Albumin 3.8  3.5 - 5.2 g/dL   Calcium 9.2  8.4 - 10.5 mg/dL   GFR 29.72 (*) >60.00 mL/min  TSH     Status: None   Collection Time    10/29/13  2:55 PM      Result Value Ref Range    TSH 0.64  0.35 - 4.50 uIU/mL  SEDIMENTATION RATE     Status: Abnormal   Collection Time    10/29/13  2:55 PM      Result Value Ref Range   Sed Rate 48 (*) 0 - 22 mm/hr  VITAMIN B12     Status: None   Collection Time    10/29/13  2:55 PM      Result Value Ref Range   Vitamin B-12 218  211 - 911 pg/mL  BASIC METABOLIC PANEL     Status: Abnormal   Collection Time    11/12/13  1:23 PM      Result Value Ref Range   Sodium 137  135 - 145 mEq/L   Potassium 4.7  3.5 - 5.1 mEq/L   Chloride 98  96 - 112 mEq/L   CO2 29  19 - 32 mEq/L   Glucose, Bld 179 (*) 70 - 99 mg/dL   BUN 56 (*) 6 - 23 mg/dL   Creatinine, Ser 1.7 (*) 0.4 - 1.2 mg/dL   Calcium 8.7  8.4 - 10.5 mg/dL   GFR 30.31 (*) >60.00 mL/min  BRAIN NATRIURETIC PEPTIDE     Status: None   Collection Time    11/12/13  1:23 PM      Result Value Ref Range   Pro B Natriuretic peptide (BNP) 85.0  0.0 - 100.0 pg/mL  RPR     Status: None  Collection Time    11/14/13 11:52 AM      Result Value Ref Range   RPR NON REAC  NON REAC  VITAMIN B12     Status: None   Collection Time    11/14/13 11:52 AM      Result Value Ref Range   Vitamin B-12 315  211 - 911 pg/mL  SEDIMENTATION RATE     Status: None   Collection Time    11/14/13 11:52 AM      Result Value Ref Range   Sed Rate 14  0 - 22 mm/hr  CBC     Status: Abnormal   Collection Time    11/14/13 11:52 AM      Result Value Ref Range   WBC 6.7  4.0 - 10.5 K/uL   RBC 3.75 (*) 3.87 - 5.11 MIL/uL   Hemoglobin 11.5 (*) 12.0 - 15.0 g/dL   HCT 33.9 (*) 36.0 - 46.0 %   MCV 90.4  78.0 - 100.0 fL   MCH 30.7  26.0 - 34.0 pg   MCHC 33.9  30.0 - 36.0 g/dL   RDW 15.7 (*) 11.5 - 15.5 %   Platelets 262  150 - 400 K/uL  TSH     Status: Abnormal   Collection Time    11/14/13 11:52 AM      Result Value Ref Range   TSH 0.144 (*) 0.350 - 4.500 uIU/mL  FOLATE     Status: None   Collection Time    11/14/13 11:52 AM      Result Value Ref Range   Folate 12.4     Comment:       Reference  Ranges             Deficient:       0.4 - 3.3 ng/mL             Indeterminate:   3.4 - 5.4 ng/mL             Normal:              > 5.4 ng/mL        TSH     Status: None   Collection Time    12/08/13 10:49 AM      Result Value Ref Range   TSH 0.66  0.35 - 4.50 uIU/mL  T4, FREE     Status: None   Collection Time    12/08/13 10:49 AM      Result Value Ref Range   Free T4 0.86  0.60 - 1.60 ng/dL  LIPID PANEL     Status: Abnormal   Collection Time    12/11/13 10:33 AM      Result Value Ref Range   Cholesterol 126  0 - 200 mg/dL   Comment: ATP III Classification       Desirable:  < 200 mg/dL               Borderline High:  200 - 239 mg/dL          High:  > = 240 mg/dL   Triglycerides 171.0 (*) 0.0 - 149.0 mg/dL   Comment: Normal:  <150 mg/dLBorderline High:  150 - 199 mg/dL   HDL 39.50  >39.00 mg/dL   VLDL 34.2  0.0 - 40.0 mg/dL   LDL Cholesterol 52  0 - 99 mg/dL   Total CHOL/HDL Ratio 3     Comment:  Men          Women1/2 Average Risk     3.4          3.3Average Risk          5.0          4.42X Average Risk          9.6          7.13X Average Risk          15.0          11.0                       NonHDL 86.50     Comment: NOTE:  Non-HDL goal should be 30 mg/dL higher than patient's LDL goal (i.e. LDL goal of < 70 mg/dL, would have non-HDL goal of < 100 mg/dL)    Assessment/Plan: Dementia MMSE performed with score of 17 demonstrating impaired cognition.  Will obtain a dementia panel to further assess.  Tinea corporis Nystatin cream given with instructions.  Will Rx Clotrimazole vaginal for possible vulvovaginal candidiasis.

## 2013-12-08 ENCOUNTER — Ambulatory Visit (INDEPENDENT_AMBULATORY_CARE_PROVIDER_SITE_OTHER): Payer: Medicare Other | Admitting: Endocrinology

## 2013-12-08 ENCOUNTER — Encounter: Payer: Self-pay | Admitting: Endocrinology

## 2013-12-08 VITALS — BP 122/70 | HR 90 | Temp 97.9°F | Ht 62.0 in | Wt 107.0 lb

## 2013-12-08 DIAGNOSIS — E0590 Thyrotoxicosis, unspecified without thyrotoxic crisis or storm: Secondary | ICD-10-CM

## 2013-12-08 NOTE — Patient Instructions (Signed)
blood tests are being requested for you today.  We'll contact you with results. If it is overactive again, i'll prescribe for you a pill to slow it back down to normal.

## 2013-12-08 NOTE — Progress Notes (Signed)
Subjective:    Patient ID: Barbara Garner, female    DOB: 06-04-1934, 79 y.o.   MRN: [id number]  HPI Hx is from patient and son.  Pt was noted to have a mildly abnormal TSH in august of 2015, on a routine blood test. she has never been on therapy for this.  she has never had XRT to the anterior neck, or thyroid surgery.  she has never had thyroid imaging.  she does not consume kelp or any other prescribed or non-prescribed thyroid medication.  she has never been on amiodarone.  She denies swelling at the anterior neck, or assoc pain. Past Medical History  Diagnosis Date  . Iron deficiency anemia, unspecified   . Chronic diastolic CHF (congestive heart failure)   . CAD  S/P CABG x 3 08/2011     LIMA to diagonal branch, SVG to OM1, SVG to PDA, EVH via bilateral thighs  . Type II or unspecified type diabetes mellitus without mention of complication, not stated as uncontrolled   . Contact dermatitis and other eczema, due to unspecified cause   . Esophageal reflux   . Headache(784.0)   . Pacemaker MDT dual chamber     DOI 08/2011  . Pure hypercholesterolemia   . hypertension   . Hypothyroidism   . Atrioventricular block, complete -intermittent   . Memory loss   . Idiopathic peripheral neuropathy   . LBBB (left bundle branch block)   . Carotid stenosis     Carotid dopplers 5/02 with 77% LICA stenosis  . Severe aortic stenosis -S/P AVR      19 mm [facility] Ease pericardial tissue valve  . Extrinsic asthma, unspecified     hasn't used inhaler in past year  . CKD (chronic kidney disease) stage 4, GFR 15-29 ml/min     Cr 1.6 in 6/11, sees Dr. Arty Baumgartner  . Impaired vision     pt. reports that she identifies her meds by looking at the pillls, she is not able to read labels on bottles    Past Surgical History  Procedure Laterality Date  . Ptca  [phone]    5 blockages  . Cardiac catheterization    . Esophageal dilation  1997  . Colonoscopy  04/1999    1 polyp  .  Esophagogastroduodenoscopy  04/1999    HH; "watermelon stomach" (severe gastritis)  . Dexa  10/2003 ,09/2005    osteoporosis,  Osteopenia  . Cataract extraction, bilateral  2003  . Esophagogastroduodenoscopy  04/2002    Gastritis  . Cardiovascular stress test  10/2002    normal (per patient)  . Abdominal hysterectomy  1977    partial; fibroids  . Bladder tack  1977  . Colonoscopy  06/2004    Neg. Int hem  . Carotid dopplers  2006  . Refractive surgery  2006  . Opthy  11/1998;12/01;11/02  . Aortic valve replacement  09/06/2011    Procedure: AORTIC VALVE REPLACEMENT (AVR);  Surgeon: Rexene Alberts, MD;  Location: [place];  Service: Open Heart Surgery;  Laterality: N/A;  . Coronary artery bypass graft  09/06/2011    Procedure: CORONARY ARTERY BYPASS GRAFTING (CABG);  Surgeon: Rexene Alberts, MD;  Location: [place];  Service: Open Heart Surgery;  Laterality: N/A;  . Colonoscopy  08/2013    History   Social History  . Marital Status: Widowed    Spouse Name: N/A    Number of Children: 3  . Years of Education: N/A   Occupational History  .  Retired    Social History Main Topics  . Smoking status: Never Smoker   . Smokeless tobacco: Never Used  . Alcohol Use: No  . Drug Use: No  . Sexual Activity: No   Other Topics Concern  . Not on file   Social History Narrative   Widowed.  Lives with son in [place].             Current Outpatient Prescriptions on File Prior to Visit  Medication Sig Dispense Refill  . aspirin 81 MG EC tablet Take 1 tablet (81 mg total) by mouth daily.  30 tablet  3  . atorvastatin (LIPITOR) 40 MG tablet Take 40 mg by mouth daily.      . Blood Glucose Monitoring Suppl (ONE TOUCH ULTRA MINI) W/DEVICE KIT USE AS DIRECTED TO TEST BLOOD GLUCOSE ONCE DAILY DX: 250.00  1 each  0  . carvedilol (COREG) 12.5 MG tablet Take 12.5 mg by mouth 2 (two) times daily with a meal.       . Cholecalciferol (VITAMIN D-3) 1000 UNITS CAPS Take 1 capsule by mouth daily.      .  clotrimazole (GYNE-LOTRIMIN 3) 2 % vaginal cream Place 1 Applicatorful vaginally at bedtime. For 3 days  21 g  0  . fluticasone (FLONASE) 50 MCG/ACT nasal spray Place 2 sprays into both nostrils daily.       . furosemide (LASIX) 40 MG tablet Take 1 tablet (40 mg total) by mouth daily.  90 tablet  1  . glipiZIDE (GLUCOTROL XL) 10 MG 24 hr tablet Take 10 mg by mouth every morning.       Marland Kitchen glucose blood test strip Use as instructed to test blood glucose once daily Dx: 250.00  100 each  12  . HYDROcodone-acetaminophen (NORCO/VICODIN) 5-325 MG per tablet Take 1 tablet by mouth every 6 (six) hours as needed for moderate pain.  30 tablet  0  . levothyroxine (SYNTHROID, LEVOTHROID) 50 MCG tablet Take 50 mcg by mouth daily before breakfast.      . nystatin (MYCOSTATIN) powder Apply topically 2 (two) times daily.  15 g  0  . omeprazole (PRILOSEC) 20 MG capsule Take 20 mg by mouth daily.      Glory Rosebush DELICA LANCETS FINE MISC Check once daily.  100 each  2  . potassium chloride 20 MEQ TBCR Take 20 mEq by mouth daily. Take when taking lasix.  30 tablet  0   No current facility-administered medications on file prior to visit.    Allergies  Allergen Reactions  . Metformin     REACTION: intolerant  . Promethazine Hcl     REACTION: u/k    Family History  Problem Relation Age of Onset  . Stroke Father     died in his 8's  . Stroke Mother     died in her 34's  . Diabetes Father   . Prostate cancer Son   . Diabetes Son     #2  . Hypertension Son     #3  . Thyroid disease Neg Hx     BP 122/70  Pulse 90  Temp(Src) 97.9 F (36.6 C) (Oral)  Ht _0  (1.575 m)  Wt 107 lb (48.535 kg)  BMI 19.57 kg/m2  SpO2 97%  Review of Systems denies weight loss, headache, hoarseness, double vision, palpitations, sob, diarrhea, polyuria, myalgias, excessive diaphoresis, numbness, tremor, anxiety, heat intolerance, LOC, and rhinorrhea. She has easy bruising.     Objective:   Physical Exam  VS: see vs  page GEN: no distress HEAD: head: no deformity eyes: no periorbital swelling, no proptosis external nose and ears are normal mouth: no lesion seen NECK: supple, thyroid is not enlarged CHEST WALL: no deformity LUNGS:  Clear to auscultation, except for a few wheezes.   CV: reg rate and rhythm; soft systolic murmur ABD: abdomen is soft, nontender.  no hepatosplenomegaly.  not distended.  no hernia MUSCULOSKELETAL: muscle bulk and strength are grossly normal.  no obvious joint swelling.  gait is normal and steady EXTEMITIES: no deformity.  no ulcer on the feet.  feet are of normal color and temp.  no edema PULSES: dorsalis pedis intact bilat.  no carotid bruit NEURO:  cn 2-12 grossly intact.   readily moves all 4's.  sensation is intact to touch on the feet.  Slight coarse tremor of the hands SKIN:  Normal texture and temperature.  No rash or suspicious lesion is visible.  Not diaphoretic. NODES:  None palpable at the neck PSYCH: alert, oriented to self only.  Does not appear anxious nor depressed.  Lab Results  Component Value Date   TSH 0.66 12/08/2013    Radiol: i reviewed report from July, 2015 CT of the C-spine: no mention is made of any goiter.  i reviewed electrocardiogram: 11/12/13    Assessment & Plan:  Hyperthyroidism, new, uncertain etiology, resolved.    Patient is advised the following: Patient Instructions  blood tests are being requested for you today.  We'll contact you with results. If it is overactive again, i'll prescribe for you a pill to slow it back down to normal.   (addendum: no rx needed now, recheck 1-2 mos)

## 2013-12-09 LAB — TSH: TSH: 0.66 u[IU]/mL (ref 0.35–4.50)

## 2013-12-09 LAB — T4, FREE: Free T4: 0.86 ng/dL (ref 0.60–1.60)

## 2013-12-10 ENCOUNTER — Other Ambulatory Visit: Payer: Self-pay | Admitting: Internal Medicine

## 2013-12-10 ENCOUNTER — Other Ambulatory Visit: Payer: Self-pay | Admitting: *Deleted

## 2013-12-10 ENCOUNTER — Telehealth: Payer: Self-pay | Admitting: *Deleted

## 2013-12-10 DIAGNOSIS — I10 Essential (primary) hypertension: Secondary | ICD-10-CM

## 2013-12-10 DIAGNOSIS — E039 Hypothyroidism, unspecified: Secondary | ICD-10-CM

## 2013-12-10 DIAGNOSIS — E876 Hypokalemia: Secondary | ICD-10-CM

## 2013-12-10 MED ORDER — LEVOTHYROXINE SODIUM 50 MCG PO TABS: 50.0000 ug | ORAL_TABLET | Freq: Every day | ORAL | Status: DC

## 2013-12-10 MED ORDER — CARVEDILOL 12.5 MG PO TABS: 12.5000 mg | ORAL_TABLET | Freq: Two times a day (BID) | ORAL | Status: DC

## 2013-12-10 MED ORDER — POTASSIUM CHLORIDE ER 20 MEQ PO TBCR
20.0000 meq | EXTENDED_RELEASE_TABLET | Freq: Every day | ORAL | Status: DC
Start: 2013-12-10 — End: 2014-03-09

## 2013-12-10 MED ORDER — POTASSIUM CHLORIDE ER 20 MEQ PO TBCR: 20.0000 meq | EXTENDED_RELEASE_TABLET | Freq: Every day | ORAL | Status: DC

## 2013-12-10 NOTE — Telephone Encounter (Signed)
Pts son - Saumya Dugue came to notify Raiford Noble PA-C , mothers provider about her medications. Pt takes Klor-con 56meq 1 tab daily ,  levothyroxine 50 mcg 1 tab daily, carvedilol 12.5mg  1 tablet twice daily. Son states that her mother needs refills.

## 2013-12-10 NOTE — Telephone Encounter (Signed)
Medications sent to pharmacy -- 90 day supplies.

## 2013-12-11 ENCOUNTER — Ambulatory Visit (INDEPENDENT_AMBULATORY_CARE_PROVIDER_SITE_OTHER): Payer: Medicare Other | Admitting: Cardiology

## 2013-12-11 ENCOUNTER — Encounter: Payer: Self-pay | Admitting: Cardiology

## 2013-12-11 VITALS — BP 112/50 | HR 90 | Ht 62.0 in | Wt 107.0 lb

## 2013-12-11 DIAGNOSIS — I509 Heart failure, unspecified: Secondary | ICD-10-CM

## 2013-12-11 DIAGNOSIS — I5032 Chronic diastolic (congestive) heart failure: Secondary | ICD-10-CM

## 2013-12-11 DIAGNOSIS — Z952 Presence of prosthetic heart valve: Secondary | ICD-10-CM

## 2013-12-11 DIAGNOSIS — I253 Aneurysm of heart: Secondary | ICD-10-CM

## 2013-12-11 DIAGNOSIS — I6529 Occlusion and stenosis of unspecified carotid artery: Secondary | ICD-10-CM

## 2013-12-11 DIAGNOSIS — Z951 Presence of aortocoronary bypass graft: Secondary | ICD-10-CM

## 2013-12-11 DIAGNOSIS — E7800 Pure hypercholesterolemia, unspecified: Secondary | ICD-10-CM

## 2013-12-11 DIAGNOSIS — R413 Other amnesia: Secondary | ICD-10-CM

## 2013-12-11 DIAGNOSIS — N184 Chronic kidney disease, stage 4 (severe): Secondary | ICD-10-CM

## 2013-12-11 DIAGNOSIS — Z954 Presence of other heart-valve replacement: Secondary | ICD-10-CM

## 2013-12-11 DIAGNOSIS — I359 Nonrheumatic aortic valve disorder, unspecified: Secondary | ICD-10-CM

## 2013-12-11 LAB — LIPID PANEL
CHOL/HDL RATIO: 3
Cholesterol: 126 mg/dL (ref 0–200)
HDL: 39.5 mg/dL (ref >39.00)
LDL CALC: 52 mg/dL (ref 0–99)
NonHDL: 86.5
TRIGLYCERIDES: 171 mg/dL — AB (ref 0.0–149.0)
VLDL: 34.2 mg/dL (ref 0.0–40.0)

## 2013-12-11 NOTE — Patient Instructions (Signed)
If you gain more than 2-3 pounds in 1 day or 3 pounds in 1 week, increase lasix (furosemide) to 40mg  two times a day for 3 days, then decrease lasix (furosemide) back to 40mg  daily.   Your physician recommends that you have  lab work today--Lipid profile.  Your physician recommends that you schedule a follow-up appointment in: about 3 months with PA/NP on a day Dr Aundra Dubin is in the office.

## 2013-12-11 NOTE — Progress Notes (Signed)
Patient ID: Barbara Garner, female   DOB: [birth year], 79 y.o.   MRN: [id number] PCP: Elyn Aquas  79 yo with history of CAD s/p unstable angina/PTCA to D1 in 1997, CKD, and aortic stenosis presents for cardiology followup after CABG/AVR and PPM placement.   She had CABG x 3 with bioprosthetic AVR in 6/13.  She had heart block post-op with placement of a dual chamber Medtronic PPM.    In 5/15, she was admitted with acute on chronic diastolic CHF and AKI.  Hemoglobin was low and she was transfused.  After this, she had EGD showing gastritis and colonoscopy showing diverticulosis.  She was admitted again in 6/15 with PNA.  She was admitted in 7/15 with low back pain from a herniated disc and general weakness.   She is now living with her son.  She has moderate dementia. She seems to be doing well currently.  She can walk around her house and around [place] without dyspnea.  No orthopnea or PND.  No chest pain.  No lightheadedness or syncope.  No recent melena or BRBPR.    Labs (6/11): K 3.4, creatinine 1.6, LDL 76, HDL 47, TSH normal  Labs (1/12): K 3.9, creatinine 1.7 Labs (11/12): TSH normal Labs (3/13): K 4.4, creatinine 1.6, LDL 79, HDL 52 Labs (4/13): K 4.5, creatinine 1.55, BNP 133 Labs (8/13): K 4.4, creatinine 1.1 Labs (8/15): K 4.7, creatinine 1.7, BNP 85 Labs (9/15): TSH normal, BNP 85, creatinine 1.35  Allergies:  1) ! Phenergan  2) ! Glucophage   Past History:  1. Anemia-iron deficiency: EGD (2015) with gastritis, colonoscopy (2015) with diverticulosis.  2. Asthma  3. Coronary artery disease: Unstable angina in 1997 with PTCA of large D1. Myoview (9/10) at [facility] cardiology showed EF 84%, stable perfusion images with no ischemia.  LHC (5/13) showed 95% OM1 stenosis, 75% mLAD, 80% ostial large D1, 75% dRCA.  She had LIMA-LAD, SVG-PDA, and SVG-OM at time of AVR in 6/13.  4. Diabetes mellitus, type II  5. GERD  6. Hypertension  7. Hypothyroidism  8. Pneumonia, hx of  9.  CKD 10. Chronic LBBB  11. Aortic stenosis: Severe. Echo (3/13): EF 55-65%, moderately LV hypertrophy, severe AS with mean gradient 40 mmHg/peak gradient 70 mmHg, PA systolic pressure 35 mmHg.  Patient had bioprosthetic AVR in 6/13.  Echo post-op in 6/13 showed mild LVH, EF 65-70%, mean gradient 14 mmHg across bioprosthetic aortic valve.  12. Carotid stenosis: Carotid dopplers 4/58 with 09% LICA stenosis. Carotid dopplers (3/13) with 40-59% bilateral carotid stenosis.  Carotid dopplers (6/15) with 40-59% bilateral carotid stenosis.  13. Post-op complete heart block in 6/13 with Medtronic dual chamber PPM.  14. Chronic diastolic CHF: Echo (9/83) with EF 60-65%, bioprosthetic aortic valve with mean gradient 9 mmHg, mild MS, mild to moderate MR.  15. Dementia 16. Low back pain: h/o herniated disc.   Family History:  No premature CAD.  Social History:  non smoker  widowed, lives with son in [place].   Review of Systems  All systems reviewed and negative except as per HPI.   Current Outpatient Prescriptions  Medication Sig Dispense Refill  . aspirin 81 MG EC tablet Take 1 tablet (81 mg total) by mouth daily.  30 tablet  3  . atorvastatin (LIPITOR) 40 MG tablet Take 40 mg by mouth daily.      . Blood Glucose Monitoring Suppl (ONE TOUCH ULTRA MINI) W/DEVICE KIT USE AS DIRECTED TO TEST BLOOD GLUCOSE ONCE DAILY DX: 250.00  1 each  0  . carvedilol (COREG) 12.5 MG tablet Take 1 tablet (12.5 mg total) by mouth 2 (two) times daily with a meal.  180 tablet  2  . Cholecalciferol (VITAMIN D-3) 1000 UNITS CAPS Take 1 capsule by mouth daily.      . clotrimazole (GYNE-LOTRIMIN 3) 2 % vaginal cream Place 1 Applicatorful vaginally at bedtime. For 3 days  21 g  0  . fluticasone (FLONASE) 50 MCG/ACT nasal spray Place 2 sprays into both nostrils daily.       . furosemide (LASIX) 40 MG tablet Take 1 tablet (40 mg total) by mouth daily.  90 tablet  1  . glipiZIDE (GLUCOTROL XL) 10 MG 24 hr tablet Take 10 mg by  mouth every morning.       Marland Kitchen glucose blood test strip Use as instructed to test blood glucose once daily Dx: 250.00  100 each  12  . HYDROcodone-acetaminophen (NORCO/VICODIN) 5-325 MG per tablet Take 1 tablet by mouth every 6 (six) hours as needed for moderate pain. BACK PAIN      . levothyroxine (SYNTHROID, LEVOTHROID) 50 MCG tablet Take 1 tablet (50 mcg total) by mouth daily before breakfast.  90 tablet  1  . nystatin (MYCOSTATIN) powder Apply topically 2 (two) times daily.  15 g  0  . omeprazole (PRILOSEC) 20 MG capsule Take 20 mg by mouth daily.      Glory Rosebush DELICA LANCETS FINE MISC Check once daily.  100 each  2  . Potassium Chloride ER 20 MEQ TBCR Take 20 mEq by mouth daily. Take when taking lasix.  90 tablet  1   No current facility-administered medications for this visit.    BP 112/50  Pulse 90  Ht '5\' 2"'  (1.575 m)  Wt 107 lb (48.535 kg)  BMI 19.57 kg/m2  SpO2 98% General: NAD, thin Neck: No JVD, no thyromegaly or thyroid nodule.  Lungs: Clear to auscultation bilaterally with normal respiratory effort. CV: Nondisplaced PMI.  Heart regular S1/S2, no S3/S4, 1/6 early SEM RUSB.  No edema.  Soft right carotid bruit.   Abdomen: Soft, nontender, no hepatosplenomegaly, no distention.  Neurologic: Alert and oriented x 3.  Psych: Normal affect. Extremities: No clubbing or cyanosis.   Assessment/Plan:  Aortic stenosis Status post AVR. Well-seated bioprosthetic aortic valve on 5/15 echo.  CAD  No chest pain. She is now s/p CABG x 3 at time of AVR. Continue ASA and statin. Carotid stenosis  Moderate bilateral carotid stenosis. Repeat carotid dopplers 6/16.   HYPERCHOLESTEROLEMIA Continue moderate-dose statin.  Due for lipids, will check today.   HYPERTENSION  BP controlled.  Complete heart block CHB post-CABG/AVR. Has Medtonic PPM. Chronic diastolic CHF Appears euvolemic.  Continue current Lasix dose for now.  Recent BMET with stable creatinine.  CKD Follow creatinine  carefully, recently has been stable.  Sees nephrology ([facility]).  Dementia Moderate.  Living with son.   Loralie Champagne 12/11/2013

## 2013-12-17 DIAGNOSIS — B354 Tinea corporis: Secondary | ICD-10-CM | POA: Insufficient documentation

## 2013-12-17 DIAGNOSIS — F0390 Unspecified dementia without behavioral disturbance: Secondary | ICD-10-CM | POA: Insufficient documentation

## 2013-12-17 NOTE — Assessment & Plan Note (Signed)
MMSE performed with score of 17 demonstrating impaired cognition.  Will obtain a dementia panel to further assess.

## 2013-12-17 NOTE — Assessment & Plan Note (Signed)
Nystatin cream given with instructions.  Will Rx Clotrimazole vaginal for possible vulvovaginal candidiasis.

## 2013-12-18 ENCOUNTER — Telehealth: Payer: Self-pay | Admitting: Physician Assistant

## 2013-12-18 MED ORDER — CARVEDILOL 25 MG PO TABS: 12.5000 mg | ORAL_TABLET | Freq: Two times a day (BID) | ORAL | Status: DC

## 2013-12-18 NOTE — Telephone Encounter (Signed)
Caller name: Terese Vi Relation to pt: daughter in law Call back number: [phone] Pharmacy: [facility] on [address] in [place]  Reason for call:   Patient daughter in law states that [facility] has carvedilol in stock and would like patient refill on this. I also informed Helene Kelp that patient will need to update DPR allowing [place] to give out health info to her.

## 2013-12-18 NOTE — Telephone Encounter (Signed)
A user error has taken place.

## 2013-12-18 NOTE — Telephone Encounter (Signed)
Error/gd °

## 2013-12-18 NOTE — Telephone Encounter (Signed)
Rx sent to [facility] on [address].  Son, Kashawna Villaverde, made aware.  No further questions or concerns voiced.

## 2013-12-18 NOTE — Telephone Encounter (Signed)
Pt's son is calling and he is on the pt's DPR states that the rx needs to be called in at [facility] main [place], states they did have the meds at 25mg  and states he is willing to cut the pills in half to give pt the 12.5 dosage. Son states if someone could call him back as soon as possible in case he has to go another route.

## 2013-12-19 ENCOUNTER — Other Ambulatory Visit: Payer: Self-pay | Admitting: *Deleted

## 2013-12-22 ENCOUNTER — Other Ambulatory Visit: Payer: Self-pay | Admitting: *Deleted

## 2013-12-22 MED ORDER — CARVEDILOL 25 MG PO TABS: 12.5000 mg | ORAL_TABLET | Freq: Two times a day (BID) | ORAL | Status: DC

## 2013-12-22 NOTE — Progress Notes (Signed)
Resent Rx as pharmacy keeps sending fax request, even though it has been noted on the fax to dispense the 25 mg [all other doses on back order] w/SIG take 0.5 tablet twice daily/SLS

## 2014-01-02 ENCOUNTER — Other Ambulatory Visit: Payer: Self-pay

## 2014-01-09 ENCOUNTER — Telehealth: Payer: Self-pay | Admitting: *Deleted

## 2014-01-09 MED ORDER — GLIPIZIDE ER 10 MG PO TB24: 10.0000 mg | ORAL_TABLET | Freq: Every morning | ORAL | Status: DC

## 2014-01-09 MED ORDER — FLUTICASONE PROPIONATE 50 MCG/ACT NA SUSP: 2.0000 | Freq: Every day | NASAL | Status: DC

## 2014-01-09 NOTE — Telephone Encounter (Signed)
Caller name: [name] Relation to pt: son Call back number: [phone] Pharmacy: [facility] on [address], [facility]  Reason for call: Requesting refills on   fluticasone [facility]) 50 MCG/ACT nasal spray  Last written 07/29/2013 Last OV 11/14/2013  glipiZIDE (GLUCOTROL XL) 10 MG 24 hr tablet  Last written 08/09/2013

## 2014-01-09 NOTE — Telephone Encounter (Signed)
Rx request to pharmacy/SLS  

## 2014-01-28 ENCOUNTER — Encounter: Payer: Self-pay | Admitting: *Deleted

## 2014-02-02 ENCOUNTER — Ambulatory Visit (INDEPENDENT_AMBULATORY_CARE_PROVIDER_SITE_OTHER): Payer: Medicare Other | Admitting: Physician Assistant

## 2014-02-02 ENCOUNTER — Encounter: Payer: Self-pay | Admitting: Physician Assistant

## 2014-02-02 ENCOUNTER — Other Ambulatory Visit: Payer: Self-pay | Admitting: Physician Assistant

## 2014-02-02 VITALS — BP 116/63 | HR 77 | Temp 97.8°F | Resp 14 | Ht 62.0 in | Wt 110.4 lb

## 2014-02-02 DIAGNOSIS — B9689 Other specified bacterial agents as the cause of diseases classified elsewhere: Secondary | ICD-10-CM | POA: Insufficient documentation

## 2014-02-02 DIAGNOSIS — J00 Acute nasopharyngitis [common cold]: Secondary | ICD-10-CM

## 2014-02-02 DIAGNOSIS — J208 Acute bronchitis due to other specified organisms: Principal | ICD-10-CM

## 2014-02-02 MED ORDER — ALBUTEROL SULFATE HFA 108 (90 BASE) MCG/ACT IN AERS: 2.0000 | INHALATION_SPRAY | Freq: Four times a day (QID) | RESPIRATORY_TRACT | Status: DC | PRN

## 2014-02-02 MED ORDER — AZITHROMYCIN 250 MG PO TABS: ORAL_TABLET | ORAL | Status: DC

## 2014-02-02 NOTE — Progress Notes (Signed)
History of Present Illness: Barbara Garner is a 79 y.o. female who present to the clinic today complaining of sinus pressure, sinus pain and nasal congestion over the past week.  Endorses chest congestion and productive cough. Patient endorses some mild wheeze.  Patient denies fever, chills, recent travel or sick contact.   History: Past Medical History  Diagnosis Date  . Iron deficiency anemia, unspecified   . Chronic diastolic CHF (congestive heart failure)   . CAD  S/P CABG x 3 08/2011     LIMA to diagonal branch, SVG to OM1, SVG to PDA, EVH via bilateral thighs  . Type II or unspecified type diabetes mellitus without mention of complication, not stated as uncontrolled   . Contact dermatitis and other eczema, due to unspecified cause   . Esophageal reflux   . Headache(784.0)   . Pacemaker MDT dual chamber     DOI 08/2011  . Pure hypercholesterolemia   . hypertension   . Hypothyroidism   . Atrioventricular block, complete -intermittent   . Memory loss   . Idiopathic peripheral neuropathy   . LBBB (left bundle branch block)   . Carotid stenosis     Carotid dopplers 3/90 with 30% LICA stenosis  . Severe aortic stenosis -S/P AVR      19 mm [place] Ease pericardial tissue valve  . Extrinsic asthma, unspecified     hasn't used inhaler in past year  . CKD (chronic kidney disease) stage 4, GFR 15-29 ml/min     Cr 1.6 in 6/11, sees Dr. Arty Baumgartner  . Impaired vision     pt. reports that she identifies her meds by looking at the pillls, she is not able to read labels on bottles   Current outpatient prescriptions: aspirin 81 MG EC tablet, Take 1 tablet (81 mg total) by mouth daily., Disp: 30 tablet, Rfl: 3;  atorvastatin (LIPITOR) 40 MG tablet, Take 40 mg by mouth daily., Disp: , Rfl: ;  Blood Glucose Monitoring Suppl (ONE TOUCH ULTRA MINI) W/DEVICE KIT, USE AS DIRECTED TO TEST BLOOD GLUCOSE ONCE DAILY DX: 250.00, Disp: 1 each, Rfl: 0 carvedilol (COREG) 25 MG tablet, Take 0.5 tablets  (12.5 mg total) by mouth 2 (two) times daily with a meal., Disp: 90 tablet, Rfl: 0;  Cholecalciferol (VITAMIN D-3) 1000 UNITS CAPS, Take 1 capsule by mouth daily., Disp: , Rfl: ;  clotrimazole (GYNE-LOTRIMIN 3) 2 % vaginal cream, Place 1 Applicatorful vaginally at bedtime. For 3 days, Disp: 21 g, Rfl: 0 fluticasone (FLONASE) 50 MCG/ACT nasal spray, Place 2 sprays into both nostrils daily., Disp: 16 g, Rfl: 0;  furosemide (LASIX) 40 MG tablet, Take 1 tablet (40 mg total) by mouth daily., Disp: 90 tablet, Rfl: 1;  glipiZIDE (GLUCOTROL XL) 10 MG 24 hr tablet, Take 1 tablet (10 mg total) by mouth every morning., Disp: 30 tablet, Rfl: 0;  glucose blood test strip, Use as instructed to test blood glucose once daily Dx: 250.00, Disp: 100 each, Rfl: 12 HYDROcodone-acetaminophen (NORCO/VICODIN) 5-325 MG per tablet, Take 1 tablet by mouth every 6 (six) hours as needed for moderate pain. BACK PAIN, Disp: , Rfl: ;  levothyroxine (SYNTHROID, LEVOTHROID) 50 MCG tablet, Take 1 tablet (50 mcg total) by mouth daily before breakfast., Disp: 90 tablet, Rfl: 1;  nystatin (MYCOSTATIN) powder, Apply topically 2 (two) times daily., Disp: 15 g, Rfl: 0 omeprazole (PRILOSEC) 20 MG capsule, Take 20 mg by mouth daily., Disp: , Rfl: ;  ONETOUCH DELICA LANCETS FINE MISC, Check once daily.,  Disp: 100 each, Rfl: 2;  Potassium Chloride ER 20 MEQ TBCR, Take 20 mEq by mouth daily. Take when taking lasix., Disp: 90 tablet, Rfl: 1 albuterol (PROVENTIL HFA;VENTOLIN HFA) 108 (90 BASE) MCG/ACT inhaler, Inhale 2 puffs into the lungs every 6 (six) hours as needed for wheezing or shortness of breath., Disp: 1 Inhaler, Rfl: 0;  azithromycin (ZITHROMAX Z-PAK) 250 MG tablet, Take 2 tablets on Day 1.  Then take 1 tablet daily., Disp: 6 tablet, Rfl: 0 Allergies  Allergen Reactions  . Metformin     REACTION: intolerant  . Promethazine Hcl     REACTION: u/k   Family History  Problem Relation Age of Onset  . Stroke Father     died in his 79's  .  Stroke Mother     died in her 69's  . Diabetes Father   . Prostate cancer Son   . Diabetes Son     #2  . Hypertension Son     #3  . Thyroid disease Neg Hx    History   Social History  . Marital Status: Widowed    Spouse Name: N/A    Number of Children: 3  . Years of Education: N/A   Occupational History  . Retired    Social History Main Topics  . Smoking status: Never Smoker   . Smokeless tobacco: Never Used  . Alcohol Use: No  . Drug Use: No  . Sexual Activity: No   Other Topics Concern  . None   Social History Narrative   Widowed.  Lives with son in [place].            Review of Systems: See HPI.  All other ROS are negative.  Physical Examination: BP 116/63 mmHg  Pulse 77  Temp(Src) 97.8 F (36.6 C) (Oral)  Resp 14  Ht '5\' 2"'  (1.575 m)  Wt 110 lb 6 oz (50.066 kg)  BMI 20.18 kg/m2  SpO2 99%  General appearance: alert, cooperative and appears stated age Head: Normocephalic, without obvious abnormality, atraumatic, sinuses tender to percussion Eyes: conjunctivae/corneas clear. PERRL, EOM's intact. Fundi benign. Ears: normal TM's and external ear canals both ears Nose: moderate congestion, turbinates swollen,no sinus tenderness Throat: lips, mucosa, and tongue normal; teeth and gums normal Neck: no adenopathy, no carotid bruit, no JVD, supple, symmetrical, trachea midline and thyroid not enlarged, symmetric, no tenderness/mass/nodules Lungs: clear to auscultation bilaterally Chest wall: no tenderness  Assessment/Plan: Acute bacterial bronchitis Rx Azithromycin.  Rx Albuterol MDI.  Take both as directed.  Increase fluids.  Rest.  Plain Mucinex or Coricedin HBP for symptomatic relief.  Place humidifier in bedroom. Return precautions discussed with patient and her son.

## 2014-02-02 NOTE — Patient Instructions (Signed)
Please take antibiotic as directed.  Increase fluids.  Rest.  Take Mucinex or Coricedin HBP for symptomatic relief.  Use Albuterol inhaler as directed, if needed for wheezing or chest tightness.  Follow-up in 1 week if symptoms are not resolved.  Metered Dose Inhaler (No Spacer Used) Inhaled medicines are the basis of treatment for asthma and other breathing problems. Inhaled medicine can only be effective if used properly. Good technique assures that the medicine reaches the lungs. Metered dose inhalers (MDIs) are used to deliver a variety of inhaled medicines. These include quick relief or rescue medicines (such as bronchodilators) and controller medicines (such as corticosteroids). The medicine is delivered by pushing down on a metal canister to release a set amount of spray. If you are using different kinds of inhalers, use your quick relief medicine to open the airways 10-15 minutes before using a steroid, if instructed to do so by your health care provider. If you are unsure which inhalers to use and the order of using them, ask your health care provider, nurse, or respiratory therapist. HOW TO USE THE INHALER 1. Remove the cap from the inhaler. 2. If you are using the inhaler for the first time, you will need to prime it. Shake the inhaler for 5 seconds and release four puffs into the air, away from your face. Ask your health care provider or pharmacist if you have questions about priming your inhaler. 3. Shake the inhaler for 5 seconds before each breath in (inhalation). 4. Position the inhaler so that the top of the canister faces up. 5. Put your index finger on the top of the medicine canister. Your thumb supports the bottom of the inhaler. 6. Open your mouth. 7. Either place the inhaler between your teeth and place your lips tightly around the mouthpiece, or hold the inhaler 1-2 inches away from your open mouth. If you are unsure of which technique to use, ask your health care  provider. 8. Breathe out (exhale) normally and as completely as possible. 9. Press the canister down with the index finger to release the medicine. 10. At the same time as the canister is pressed, inhale deeply and slowly until your lungs are completely filled. This should take 4-6 seconds. Keep your tongue down. 11. Hold the medicine in your lungs for 5-10 seconds (10 seconds is best). This helps the medicine get into the small airways of your lungs. 12. Breathe out slowly, through pursed lips. Whistling is an example of pursed lips. 13. Wait at least 1 minute between puffs. Continue with the above steps until you have taken the number of puffs your health care provider has ordered. Do not use the inhaler more than your health care provider directs you to. 14. Replace the cap on the inhaler. 15. Follow the directions from your health care provider or the inhaler insert for cleaning the inhaler. If you are using a steroid inhaler, after your last puff, rinse your mouth with water, gargle, and spit out the water. Do not swallow the water. AVOID:  Inhaling before or after starting the spray of medicine. It takes practice to coordinate your breathing with triggering the spray.  Inhaling through the nose (rather than the mouth) when triggering the spray. HOW TO DETERMINE IF YOUR INHALER IS FULL OR NEARLY EMPTY You cannot know when an inhaler is empty by shaking it. Some inhalers are now being made with dose counters. Ask your health care provider for a prescription that has a dose counter if you  feel you need that extra help. If your inhaler does not have a counter, ask your health care provider to help you determine the date you need to refill your inhaler. Write the refill date on a calendar or your inhaler canister. Refill your inhaler 7-10 days before it runs out. Be sure to keep an adequate supply of medicine. This includes making sure it has not expired, and making sure you have a spare  inhaler. SEEK MEDICAL CARE IF:  Symptoms are only partially relieved with your inhaler.  You are having trouble using your inhaler.  You experience an increase in phlegm. SEEK IMMEDIATE MEDICAL CARE IF:  You feel little or no relief with your inhalers. You are still wheezing and feeling shortness of breath, tightness in your chest, or both.  You have dizziness, headaches, or a fast heart rate.  You have chills, fever, or night sweats.  There is a noticeable increase in phlegm production, or there is blood in the phlegm. MAKE SURE YOU:  Understand these instructions.  Will watch your condition.  Will get help right away if you are not doing well or get worse. Document Released: 01/01/2007 Document Revised: 07/21/2013 Document Reviewed: 08/22/2012 [facility] Patient Information 2015 [place], [place]. This information is not intended to replace advice given to you by your health care provider. Make sure you discuss any questions you have with your health care provider.

## 2014-02-02 NOTE — Assessment & Plan Note (Signed)
Rx Azithromycin.  Rx Albuterol MDI.  Take both as directed.  Increase fluids.  Rest.  Plain Mucinex or Coricedin HBP for symptomatic relief.  Place humidifier in bedroom. Return precautions discussed with patient and her son.

## 2014-02-02 NOTE — Progress Notes (Signed)
Pre visit review using our clinic review tool, if applicable. No additional management support is needed unless otherwise documented below in the visit note/SLS  

## 2014-02-03 NOTE — Telephone Encounter (Signed)
Rx request to pharmacy/SLS  

## 2014-02-20 ENCOUNTER — Ambulatory Visit: Payer: Medicare Other | Admitting: Physician Assistant

## 2014-02-26 ENCOUNTER — Encounter (HOSPITAL_COMMUNITY): Payer: Self-pay | Admitting: Cardiology

## 2014-03-03 ENCOUNTER — Ambulatory Visit (INDEPENDENT_AMBULATORY_CARE_PROVIDER_SITE_OTHER): Payer: Medicare Other | Admitting: Physician Assistant

## 2014-03-03 ENCOUNTER — Ambulatory Visit: Payer: Medicare Other | Admitting: Physician Assistant

## 2014-03-03 ENCOUNTER — Encounter: Payer: Self-pay | Admitting: Physician Assistant

## 2014-03-03 ENCOUNTER — Other Ambulatory Visit (HOSPITAL_COMMUNITY)
Admission: RE | Admit: 2014-03-03 | Discharge: 2014-03-03 | Disposition: A | Discharge status: Home / Self Care | Payer: Medicare Other | Source: Ambulatory Visit | Attending: Physician Assistant | Admitting: Physician Assistant

## 2014-03-03 VITALS — BP 92/55 | HR 97 | Temp 97.7°F | Resp 14 | Ht 62.0 in | Wt 111.4 lb

## 2014-03-03 DIAGNOSIS — N760 Acute vaginitis: Secondary | ICD-10-CM | POA: Diagnosis present

## 2014-03-03 DIAGNOSIS — R399 Unspecified symptoms and signs involving the genitourinary system: Secondary | ICD-10-CM | POA: Insufficient documentation

## 2014-03-03 LAB — POCT URINALYSIS DIPSTICK
BILIRUBIN UA: NEGATIVE
Blood, UA: NEGATIVE
GLUCOSE UA: NEGATIVE
KETONES UA: NEGATIVE
Nitrite, UA: NEGATIVE
Protein, UA: POSITIVE
SPEC GRAV UA: 1.025
Urobilinogen, UA: 0.2
pH, UA: 5

## 2014-03-03 MED ORDER — FLUCONAZOLE 150 MG PO TABS: ORAL_TABLET | ORAL | Status: DC

## 2014-03-03 MED ORDER — CEPHALEXIN 250 MG PO CAPS
250.0000 mg | ORAL_CAPSULE | Freq: Two times a day (BID) | ORAL | Status: DC
Start: 2014-03-03 — End: 2014-03-05

## 2014-03-03 NOTE — Assessment & Plan Note (Addendum)
Seems consistent with candidiasis. Severe with tenderness and edema of labia. Rx Diflucan 3-day course. Stop lipitor while on Diflucan. Supportive measures discussed.  Wet prep sent for further testing.  Follow-up 1 week.

## 2014-03-03 NOTE — Patient Instructions (Signed)
Please take Keflex and Diflucan as directed.  Do not take cholesterol medication while on the Diflucan.  Increase fluids.  Keep the vaginal area clean and dry.  I will call you with all of your results.  Follow-up with me in 1 week.

## 2014-03-03 NOTE — Progress Notes (Signed)
Patient presents to clinic today c/o vaginal pruritus, pain and irritation over the past month that has been worsening over the past week.  Denies fever, chills, malaise.  Denies known vaginal discharge but her daughter who is present and takes care of the patient has noted a thick white discharge. Also has noted rash of vulva. Patient also endorsing urinary urgency, frequency and dysuria.  Denies flank pain, nausea or vomiting.  Past Medical History  Diagnosis Date  . Iron deficiency anemia, unspecified   . Chronic diastolic CHF (congestive heart failure)   . CAD  S/P CABG x 3 08/2011     LIMA to diagonal branch, SVG to OM1, SVG to PDA, EVH via bilateral thighs  . Type II or unspecified type diabetes mellitus without mention of complication, not stated as uncontrolled   . Contact dermatitis and other eczema, due to unspecified cause   . Esophageal reflux   . Headache(784.0)   . Pacemaker MDT dual chamber     DOI 08/2011  . Pure hypercholesterolemia   . hypertension   . Hypothyroidism   . Atrioventricular block, complete -intermittent   . Memory loss   . Idiopathic peripheral neuropathy   . LBBB (left bundle branch block)   . Carotid stenosis     Carotid dopplers 4/70 with 96% LICA stenosis  . Severe aortic stenosis -S/P AVR      19 mm [facility] Ease pericardial tissue valve  . Extrinsic asthma, unspecified     hasn't used inhaler in past year  . CKD (chronic kidney disease) stage 4, GFR 15-29 ml/min     Cr 1.6 in 6/11, sees Dr. Arty Baumgartner  . Impaired vision     pt. reports that she identifies her meds by looking at the pillls, she is not able to read labels on bottles    Current Outpatient Prescriptions on File Prior to Visit  Medication Sig Dispense Refill  . albuterol (PROVENTIL HFA;VENTOLIN HFA) 108 (90 BASE) MCG/ACT inhaler Inhale 2 puffs into the lungs every 6 (six) hours as needed for wheezing or shortness of breath. 1 Inhaler 0  . aspirin 81 MG EC tablet Take 1  tablet (81 mg total) by mouth daily. 30 tablet 3  . atorvastatin (LIPITOR) 40 MG tablet Take 40 mg by mouth daily.    . Blood Glucose Monitoring Suppl (ONE TOUCH ULTRA MINI) W/DEVICE KIT USE AS DIRECTED TO TEST BLOOD GLUCOSE ONCE DAILY DX: 250.00 1 each 0  . carvedilol (COREG) 25 MG tablet Take 0.5 tablets (12.5 mg total) by mouth 2 (two) times daily with a meal. 90 tablet 0  . Cholecalciferol (VITAMIN D-3) 1000 UNITS CAPS Take 1 capsule by mouth daily.    . fluticasone (FLONASE) 50 MCG/ACT nasal spray Place 2 sprays into both nostrils daily. 16 g 0  . furosemide (LASIX) 40 MG tablet Take 1 tablet (40 mg total) by mouth daily. 90 tablet 1  . glipiZIDE (GLUCOTROL XL) 10 MG 24 hr tablet TAKE ONE TABLET BY MOUTH IN THE MORNING 30 tablet 2  . glucose blood test strip Use as instructed to test blood glucose once daily Dx: 250.00 100 each 12  . HYDROcodone-acetaminophen (NORCO/VICODIN) 5-325 MG per tablet Take 1 tablet by mouth every 6 (six) hours as needed for moderate pain. BACK PAIN    . levothyroxine (SYNTHROID, LEVOTHROID) 50 MCG tablet Take 1 tablet (50 mcg total) by mouth daily before breakfast. 90 tablet 1  . nystatin (MYCOSTATIN) powder Apply topically 2 (two)  times daily. 15 g 0  . omeprazole (PRILOSEC) 20 MG capsule Take 20 mg by mouth daily.    [place] DELICA LANCETS FINE MISC Check once daily. 100 each 2  . Potassium Chloride ER 20 MEQ TBCR Take 20 mEq by mouth daily. Take when taking lasix. 90 tablet 1   No current facility-administered medications on file prior to visit.    Allergies  Allergen Reactions  . Metformin     REACTION: intolerant  . Promethazine Hcl     REACTION: u/k    Family History  Problem Relation Age of Onset  . Stroke Father     died in his 59's  . Stroke Mother     died in her 37's  . Diabetes Father   . Prostate cancer Son   . Diabetes Son     #2  . Hypertension Son     #3  . Thyroid disease Neg Hx     History   Social History  . Marital  Status: Widowed    Spouse Name: N/A    Number of Children: 3  . Years of Education: N/A   Occupational History  . Retired    Social History Main Topics  . Smoking status: Never Smoker   . Smokeless tobacco: Never Used  . Alcohol Use: No  . Drug Use: No  . Sexual Activity: No   Other Topics Concern  . None   Social History Narrative   Widowed.  Lives with son in [place].             Review of Systems - See HPI.  All other ROS are negative.  BP 92/55 mmHg  Pulse 97  Temp(Src) 97.7 F (36.5 C) (Oral)  Resp 14  Ht '5\' 2"'  (1.575 m)  Wt 111 lb 6 oz (50.519 kg)  BMI 20.37 kg/m2  SpO2 99%  Physical Exam  Constitutional: She is oriented to person, place, and time and well-developed, well-nourished, and in no distress.  HENT:  Head: Normocephalic and atraumatic.  Eyes: Conjunctivae are normal.  Cardiovascular: Normal rate, regular rhythm, normal heart sounds and intact distal pulses.   Pulmonary/Chest: Effort normal and breath sounds normal. No respiratory distress. She has no wheezes. She has no rales. She exhibits no tenderness.  Abdominal: Soft. Bowel sounds are normal. She exhibits no distension and no mass. There is no tenderness. There is no rebound and no guarding.  Negative CVA tenderness  Genitourinary: Vulva exhibits erythema, lesion and rash. Vagina exhibits exudate. Thick  white and vaginal discharge found.  Neurological: She is alert and oriented to person, place, and time.  Skin: Skin is warm and dry.  Vitals reviewed.   Recent Results (from the past 2160 hour(s))  TSH     Status: None   Collection Time: 12/08/13 10:49 AM  Result Value Ref Range   TSH 0.66 0.35 - 4.50 uIU/mL  T4, free     Status: None   Collection Time: 12/08/13 10:49 AM  Result Value Ref Range   Free T4 0.86 0.60 - 1.60 ng/dL  Lipid panel     Status: Abnormal   Collection Time: 12/11/13 10:33 AM  Result Value Ref Range   Cholesterol 126 0 - 200 mg/dL    Comment: ATP III  Classification       Desirable:  < 200 mg/dL               Borderline High:  200 - 239 mg/dL  High:  > = 240 mg/dL   Triglycerides 171.0 (H) 0.0 - 149.0 mg/dL    Comment: Normal:  <150 mg/dLBorderline High:  150 - 199 mg/dL   HDL 39.50 >39.00 mg/dL   VLDL 34.2 0.0 - 40.0 mg/dL   LDL Cholesterol 52 0 - 99 mg/dL   Total CHOL/HDL Ratio 3     Comment:                Men          Women1/2 Average Risk     3.4          3.3Average Risk          5.0          4.42X Average Risk          9.6          7.13X Average Risk          15.0          11.0                       NonHDL 86.50     Comment: NOTE:  Non-HDL goal should be 30 mg/dL higher than patient's LDL goal (i.e. LDL goal of < 70 mg/dL, would have non-HDL goal of < 100 mg/dL)  POCT urinalysis dipstick     Status: Abnormal   Collection Time: 03/03/14 11:52 AM  Result Value Ref Range   Color, UA gold    Clarity, UA cloudy    Glucose, UA neg    Bilirubin, UA neg    Ketones, UA neg    Spec Grav, UA 1.025    Blood, UA neg    pH, UA 5.0    Protein, UA positive    Urobilinogen, UA 0.2    Nitrite, UA neg    Leukocytes, UA large (3+)     Assessment/Plan: Vaginitis and vulvovaginitis Seems consistent with candidiasis. Severe with tenderness and edema of labia. Rx Diflucan 3-day course. Stop lipitor while on Diflucan. Supportive measures discussed.  Wet prep sent for further testing.  Follow-up 1 week.  UTI symptoms Urine dip reveals large Leukocytes.  Will treat with Keflex BID while waiting on culture.  Increase fluids. Probiotic and cranberry supplement.  Follow-up in 1 week.

## 2014-03-03 NOTE — Assessment & Plan Note (Signed)
Urine dip reveals large Leukocytes.  Will treat with Keflex BID while waiting on culture.  Increase fluids. Probiotic and cranberry supplement.  Follow-up in 1 week.

## 2014-03-04 LAB — CERVICOVAGINAL ANCILLARY ONLY
Wet Prep (BD Affirm): NEGATIVE
Wet Prep (BD Affirm): NEGATIVE
Wet Prep (BD Affirm): NEGATIVE

## 2014-03-05 ENCOUNTER — Encounter: Payer: Self-pay | Admitting: Physician Assistant

## 2014-03-05 ENCOUNTER — Ambulatory Visit (INDEPENDENT_AMBULATORY_CARE_PROVIDER_SITE_OTHER): Payer: Medicare Other | Admitting: *Deleted

## 2014-03-05 ENCOUNTER — Ambulatory Visit (INDEPENDENT_AMBULATORY_CARE_PROVIDER_SITE_OTHER): Payer: Medicare Other | Admitting: Physician Assistant

## 2014-03-05 VITALS — BP 110/63 | HR 67 | Ht 62.0 in | Wt 109.0 lb

## 2014-03-05 DIAGNOSIS — N184 Chronic kidney disease, stage 4 (severe): Secondary | ICD-10-CM

## 2014-03-05 DIAGNOSIS — Z954 Presence of other heart-valve replacement: Secondary | ICD-10-CM

## 2014-03-05 DIAGNOSIS — I2510 Atherosclerotic heart disease of native coronary artery without angina pectoris: Secondary | ICD-10-CM

## 2014-03-05 DIAGNOSIS — I5032 Chronic diastolic (congestive) heart failure: Secondary | ICD-10-CM

## 2014-03-05 DIAGNOSIS — I442 Atrioventricular block, complete: Secondary | ICD-10-CM

## 2014-03-05 DIAGNOSIS — E785 Hyperlipidemia, unspecified: Secondary | ICD-10-CM

## 2014-03-05 DIAGNOSIS — I10 Essential (primary) hypertension: Secondary | ICD-10-CM

## 2014-03-05 DIAGNOSIS — Z952 Presence of prosthetic heart valve: Secondary | ICD-10-CM

## 2014-03-05 DIAGNOSIS — N189 Chronic kidney disease, unspecified: Secondary | ICD-10-CM

## 2014-03-05 DIAGNOSIS — R413 Other amnesia: Secondary | ICD-10-CM

## 2014-03-05 DIAGNOSIS — I5030 Unspecified diastolic (congestive) heart failure: Secondary | ICD-10-CM

## 2014-03-05 DIAGNOSIS — Z950 Presence of cardiac pacemaker: Secondary | ICD-10-CM

## 2014-03-05 DIAGNOSIS — I6523 Occlusion and stenosis of bilateral carotid arteries: Secondary | ICD-10-CM

## 2014-03-05 LAB — MDC_IDC_ENUM_SESS_TYPE_INCLINIC
Battery Remaining Longevity: 120 mo
Battery Voltage: 2.79 V
Brady Statistic AS VS Percent: 26 %
Lead Channel Pacing Threshold Amplitude: 0.5 V
Lead Channel Pacing Threshold Amplitude: 0.75 V
Lead Channel Pacing Threshold Pulse Width: 0.4 ms
Lead Channel Setting Pacing Amplitude: 2 V
Lead Channel Setting Pacing Amplitude: 2.5 V
Lead Channel Setting Pacing Pulse Width: 0.4 ms
Lead Channel Setting Sensing Sensitivity: 5.6 mV
MDC IDC MSMT BATTERY IMPEDANCE: 206 Ohm
MDC IDC MSMT LEADCHNL RA IMPEDANCE VALUE: 432 Ohm
MDC IDC MSMT LEADCHNL RA PACING THRESHOLD PULSEWIDTH: 0.4 ms
MDC IDC MSMT LEADCHNL RA SENSING INTR AMPL: 2 mV
MDC IDC MSMT LEADCHNL RV IMPEDANCE VALUE: 621 Ohm
MDC IDC MSMT LEADCHNL RV SENSING INTR AMPL: 15.67 mV
MDC IDC SESS DTM: 20151217171726
MDC IDC STAT BRADY AP VP PERCENT: 3 %
MDC IDC STAT BRADY AP VS PERCENT: 65 %
MDC IDC STAT BRADY AS VP PERCENT: 6 %

## 2014-03-05 NOTE — Progress Notes (Signed)
Cardiology Office Note   Date:  03/05/2014   ID:  Barbara Garner, DOB 21-Mar-1934, MRN [id number]  PCP:  Leeanne Rio, PA-C  Cardiologist:  Dr. Loralie Champagne   Electrophysiologist:  Dr. Virl Axe    History of Present Illness: Barbara Garner is a 79 y.o. female with a hx of CAD and aortic stenosis s/p CABG + bioprosthetic AVR in 2013, CHB s/p PPM, diastolic CHF, LBBB, HTN, CKD, dementia. Admitted several times over this year. She was admitted in 0/2637 with a/c diastolic CHF and bronchitis. Diuresis was c/b AKI. She developed worsening anemia requiring transfusion with PRBCs. FU colonoscopy demonstrated diverticulosis and EGD demonstrated gastritis. She was admitted twice in July. One admission was for severe back pain thought to be due to HNP. She was referred to PT. Pan-cultures (including CSF) were negative in setting of leukocytosis. She was also admitted for a/c diastolic CHF in the setting of pneumonia.   Last seen by Dr. Loralie Champagne 11/2013.  She returns for FU.  She is here today with her son. She is doing well. She denies chest pain, dyspnea, syncope, orthopnea, PND or edema.   Studies: - Echo (5/15): EF 60-65%, Gr 2 DD, AVR ok (mean 9 mmHg), MAC, mild MS (mean 6 mmHg), mild to mod MR, mod LAE - Carotid US (6/15): Bilateral ICA 40-59%   Recent Labs: 10/29/2013: ALT 13 11/12/2013: BUN 56*; Creatinine 1.7*; Potassium 4.7; Pro B Natriuretic peptide (BNP) 85.0; Sodium 137 11/14/2013: Hemoglobin 11.5* 12/08/2013: TSH 0.66 12/11/2013: LDL (calc) 52    Recent Radiology: No results found.    Wt Readings from Last 3 Encounters:  03/05/14 109 lb (49.442 kg)  03/03/14 111 lb 6 oz (50.519 kg)  02/02/14 110 lb 6 oz (50.066 kg)     Past Medical History:  1. Anemia-iron deficiency  2. Asthma  3. Coronary artery disease: Unstable angina in 1997 with PTCA of large D1. Myoview (9/10) at [facility] cardiology showed EF 84%, stable perfusion images with no  ischemia. LHC (5/13) showed 95% OM1 stenosis, 75% mLAD, 80% ostial large D1, 75% dRCA. She had LIMA-LAD, SVG-PDA, and SVG-OM at time of AVR in 6/13.  4. Diabetes mellitus, type II  5. GERD  6. Hypertension  7. Hypothyroidism  8. Pneumonia, hx of  9. CKD: creatinine 1.6 in 6/11  10. Chronic LBBB  11. Aortic stenosis: Severe. Echo (3/13): EF 55-65%, moderately LV hypertrophy, severe AS with mean gradient 40 mmHg/peak gradient 70 mmHg, PA systolic pressure 35 mmHg. Patient had bioprosthetic AVR in 6/13. Echo post-op in 6/13 showed mild LVH, EF 65-70%, mean gradient 14 mmHg across bioprosthetic aortic valve.  12. Carotid stenosis: Carotid dopplers 8/58 with 85% LICA stenosis. Carotid dopplers (3/13) with 40-59% bilateral carotid stenosis.  13. Post-op complete heart block in 6/13 with [employer] dual chamber PPM.  Past Medical History  Diagnosis Date  . Iron deficiency anemia, unspecified   . Chronic diastolic CHF (congestive heart failure)   . CAD  S/P CABG x 3 08/2011     LIMA to diagonal branch, SVG to OM1, SVG to PDA, EVH via bilateral thighs  . Type II or unspecified type diabetes mellitus without mention of complication, not stated as uncontrolled   . Contact dermatitis and other eczema, due to unspecified cause   . Esophageal reflux   . Headache(784.0)   . Pacemaker MDT dual chamber     DOI 08/2011  . Pure hypercholesterolemia   . hypertension   . Hypothyroidism   .  Atrioventricular block, complete -intermittent   . Memory loss   . Idiopathic peripheral neuropathy   . LBBB (left bundle branch block)   . Carotid stenosis     Carotid dopplers 2/45 with 80% LICA stenosis  . Severe aortic stenosis -S/P AVR      19 mm [facility] Ease pericardial tissue valve  . Extrinsic asthma, unspecified     hasn't used inhaler in past year  . CKD (chronic kidney disease) stage 4, GFR 15-29 ml/min     Cr 1.6 in 6/11, sees Dr. Arty Baumgartner  . Impaired vision     pt. reports that she identifies  her meds by looking at the pillls, she is not able to read labels on bottles    Current Outpatient Prescriptions  Medication Sig Dispense Refill  . albuterol (PROVENTIL HFA;VENTOLIN HFA) 108 (90 BASE) MCG/ACT inhaler Inhale 2 puffs into the lungs every 6 (six) hours as needed for wheezing or shortness of breath. 1 Inhaler 0  . aspirin 81 MG EC tablet Take 1 tablet (81 mg total) by mouth daily. 30 tablet 3  . atorvastatin (LIPITOR) 40 MG tablet Take 40 mg by mouth daily.    . Blood Glucose Monitoring Suppl (ONE TOUCH ULTRA MINI) W/DEVICE KIT USE AS DIRECTED TO TEST BLOOD GLUCOSE ONCE DAILY DX: 250.00 1 each 0  . Cholecalciferol (VITAMIN D-3) 1000 UNITS CAPS Take 1 capsule by mouth daily.    . fluconazole (DIFLUCAN) 150 MG tablet Take 1 tablet by mouth daily for 3 days. 3 tablet 0  . fluticasone (FLONASE) 50 MCG/ACT nasal spray Place 2 sprays into both nostrils daily. 16 g 0  . furosemide (LASIX) 40 MG tablet Take 1 tablet (40 mg total) by mouth daily. 90 tablet 1  . glipiZIDE (GLUCOTROL XL) 10 MG 24 hr tablet TAKE ONE TABLET BY MOUTH IN THE MORNING 30 tablet 2  . glucose blood test strip Use as instructed to test blood glucose once daily Dx: 250.00 100 each 12  . HYDROcodone-acetaminophen (NORCO/VICODIN) 5-325 MG per tablet Take 1 tablet by mouth every 6 (six) hours as needed for moderate pain. BACK PAIN    . levothyroxine (SYNTHROID, LEVOTHROID) 50 MCG tablet Take 1 tablet (50 mcg total) by mouth daily before breakfast. 90 tablet 1  . nystatin (MYCOSTATIN) powder Apply topically 2 (two) times daily. 15 g 0  . omeprazole (PRILOSEC) 20 MG capsule Take 20 mg by mouth daily.    . Potassium Chloride ER 20 MEQ TBCR Take 20 mEq by mouth daily. Take when taking lasix. 90 tablet 1   No current facility-administered medications for this visit.     Allergies:   Metformin and Promethazine hcl   Social History:  The patient  reports that she has never smoked. She has never used smokeless tobacco. She  reports that she does not drink alcohol or use illicit drugs.   Family History:  The patient's family history includes Diabetes in her father and son; Hypertension in her son; Prostate cancer in her son; Stroke in her father and mother. There is no history of Thyroid disease or Heart attack.    ROS:  Please see the history of present illness.       All other systems reviewed and negative.    PHYSICAL EXAM: VS:  BP 110/63 mmHg  Pulse 67  Ht '5\' 2"'  (1.575 m)  Wt 109 lb (49.442 kg)  BMI 19.93 kg/m2 Well nourished, well developed, in no acute distress HEENT: normal Neck:  no  JVD Cardiac:  normal S1, S2;  RRR; no murmur   Lungs:   clear to auscultation bilaterally, no wheezing, rhonchi or rales Abd: soft, nontender, no hepatomegaly Ext:  no edema Skin: warm and dry Neuro:  CNs 2-12 intact, no focal abnormalities noted  EKG:  Atrial paced, HR 67, LBBB      ASSESSMENT AND PLAN: No diagnosis found.  1.   Aortic Stenosis s/p AVR:  She is doing well. AVR was okay at echocardiogram in 07/2013. 2.   CAD s/p CABG:  No angina. Continue aspirin, statin. 3.   Bilateral Carotid Stenosis:  Repeat US in 08/2014. 4.   Hyperlipidemia:  Continue statin.  10/29/2013: ALT 13 12/11/2013: HDL Cholesterol by NMR 39.50; LDL (calc) 52   5.   Hypertension:  Controlled.  6.   CHB s/p PPM:  FU with EP as planned.  7.   Chronic Diastolic CHF:  Volume stable.  Check BMET today.  8.   Chronic Kidney Disease:  She sees Nephrology (Dr. Marval Regal).  9.   Dementia:  FU with PCP. 10. Low TSH:  Referred to endocrinology.   Disposition:   FU with me 3 mos and Dr. Loralie Champagne 6 mos.    Signed, Versie Starks, MHS 03/05/2014 3:34 PM    [facility] [address], [place], [place]  [address] Phone: [phone]; Fax: [phone]

## 2014-03-05 NOTE — Patient Instructions (Signed)
Your physician recommends that you continue on your current medications as directed. Please refer to the Current Medication list given to you today.  Your physician recommends that you return for lab work in: [place]; BMET  Your physician recommends that you schedule a follow-up appointment in: [place], [place] wants you to follow-up in: [place]. Barbara Garner. You will receive a reminder letter in the mail two months in advance. If you don't receive a letter, please call our office to schedule the follow-up appointment.

## 2014-03-05 NOTE — Progress Notes (Signed)
Pacemaker check in clinic. Normal device function. Thresholds, sensing, impedances consistent with previous measurements. Device programmed to maximize longevity. 13 mode switches all < 1 minute.  No high ventricular rates noted. Device programmed at appropriate safety margins. Histogram distribution appropriate for patient activity level. Device programmed to optimize intrinsic conduction. Estimated longevity 10 years. Patient enrolled in remote follow-up/TTM's with [employer]. Plan to follow every 3 months remotely and see annually in office. Patient education completed.  [employer] 06/04/14.

## 2014-03-06 ENCOUNTER — Other Ambulatory Visit: Payer: Self-pay

## 2014-03-06 DIAGNOSIS — E875 Hyperkalemia: Secondary | ICD-10-CM

## 2014-03-06 LAB — BASIC METABOLIC PANEL
BUN: 62 mg/dL — ABNORMAL HIGH (ref 6–23)
CO2: 26 meq/L (ref 19–32)
Calcium: 9.4 mg/dL (ref 8.4–10.5)
Chloride: 103 mEq/L (ref 96–112)
Creatinine, Ser: 1.7 mg/dL — ABNORMAL HIGH (ref 0.4–1.2)
GFR: 31.76 mL/min — ABNORMAL LOW (ref >60.00)
GLUCOSE: 123 mg/dL — AB (ref 70–99)
Potassium: 5.2 mEq/L — ABNORMAL HIGH (ref 3.5–5.1)
SODIUM: 139 meq/L (ref 135–145)

## 2014-03-06 NOTE — Progress Notes (Signed)
Pt husband notified of the results and will call the office when he gets home to make an appt.

## 2014-03-09 ENCOUNTER — Ambulatory Visit (INDEPENDENT_AMBULATORY_CARE_PROVIDER_SITE_OTHER): Payer: Medicare Other | Admitting: Medical

## 2014-03-09 ENCOUNTER — Encounter: Payer: Self-pay | Admitting: Medical

## 2014-03-09 ENCOUNTER — Telehealth: Payer: Self-pay | Admitting: Physician Assistant

## 2014-03-09 VITALS — BP 95/58 | HR 97 | Temp 98.4°F | Ht 62.0 in | Wt 110.4 lb

## 2014-03-09 DIAGNOSIS — B379 Candidiasis, unspecified: Secondary | ICD-10-CM

## 2014-03-09 DIAGNOSIS — R399 Unspecified symptoms and signs involving the genitourinary system: Secondary | ICD-10-CM

## 2014-03-09 LAB — CULTURE, URINE COMPREHENSIVE: Colony Count: 25000

## 2014-03-09 MED ORDER — AMMONIUM LACTATE 12 % EX LOTN: 1.0000 "application " | TOPICAL_LOTION | CUTANEOUS | Status: DC | PRN

## 2014-03-09 MED ORDER — NYSTATIN 100000 UNIT/GM EX OINT: 1.0000 "application " | TOPICAL_OINTMENT | Freq: Two times a day (BID) | CUTANEOUS | Status: DC

## 2014-03-09 MED ORDER — FLUCONAZOLE 150 MG PO TABS: 150.0000 mg | ORAL_TABLET | Freq: Once | ORAL | Status: DC

## 2014-03-09 NOTE — Telephone Encounter (Signed)
Will send message to lpn . Askd her to call pt on wed  and talk to pt or daughter in law if she is available. See how her symptoms are. Remind I do want to see her on Thursday make sure improving before the holidays.

## 2014-03-09 NOTE — Patient Instructions (Addendum)
Clinically your vaginal area looks worse than general skin condition and you may have had severe yeast infection made worse by antibiotics. Rx diflucan today and another on wed. Mycostatin to the area outside but not intravaginally.  Your mrsa in the urine ay be contamination from perivaginal area. Presently I think oral antibiotic may make your vaginal area worse and clinically not convinced necessary(the soft tissue in this region does not appear to have cellulitis). May need to  Start other antibiotic on Thursday. But want to see response to diflucan and mycostatin first.  The diffuse skin rash may be allergic reaction to oral antibiotic(keflex) or dry skin. I am prescribing lac-hydrin and she/pt  can take benadryl low dose otc approximately 6.25 mg every 6 hrs if needed for the itching.  Follow up on Thursday morning for recheck. Expressed to pt and relative importance of follow up.  I will send my lpn a message asking her to call pt on Wednesday to see if her symptoms are improved with the above plan and to remind pt to come in on Thursday.

## 2014-03-09 NOTE — Progress Notes (Signed)
Subjective:    Patient ID: Barbara Garner, female    DOB: 09/18/1934, 79 y.o.   MRN: [id number]  HPI  Pt in with daughter in law. She itching all over her body. But last time seen for redness around her vaginal area.   Pt was given diflucan  for possible yeast infection. Elyn Aquas PA-C also prescribed some cephalexin. Pt has no  Hx of allergic reaction to antibioitics.   The vaginal area is mild  improved per daughter in law.   No fevers or chills. Then states she is always cold.  Since this am seems to have diffuse mild skin rash that itches.  Pt on also has some mild pain on urination only one time today but none since. She has very low count MRSA in her urine.  Past Medical History  Diagnosis Date  . Iron deficiency anemia, unspecified   . Chronic diastolic CHF (congestive heart failure)   . CAD  S/P CABG x 3 08/2011     LIMA to diagonal branch, SVG to OM1, SVG to PDA, EVH via bilateral thighs  . Type II or unspecified type diabetes mellitus without mention of complication, not stated as uncontrolled   . Contact dermatitis and other eczema, due to unspecified cause   . Esophageal reflux   . Headache(784.0)   . Pacemaker MDT dual chamber     DOI 08/2011  . Pure hypercholesterolemia   . hypertension   . Hypothyroidism   . Atrioventricular block, complete -intermittent   . Memory loss   . Idiopathic peripheral neuropathy   . LBBB (left bundle branch block)   . Carotid stenosis     Carotid dopplers 6/50 with 35% LICA stenosis  . Severe aortic stenosis -S/P AVR      19 mm [facility] Ease pericardial tissue valve  . Extrinsic asthma, unspecified     hasn't used inhaler in past year  . CKD (chronic kidney disease) stage 4, GFR 15-29 ml/min     Cr 1.6 in 6/11, sees Dr. Arty Baumgartner  . Impaired vision     pt. reports that she identifies her meds by looking at the pillls, she is not able to read labels on bottles    History   Social History  . Marital Status: Widowed    Spouse Name: N/A    Number of Children: 3  . Years of Education: N/A   Occupational History  . Retired    Social History Main Topics  . Smoking status: Never Smoker   . Smokeless tobacco: Never Used  . Alcohol Use: No  . Drug Use: No  . Sexual Activity: No   Other Topics Concern  . Not on file   Social History Narrative   Widowed.  Lives with son in [place].             Past Surgical History  Procedure Laterality Date  . Ptca  [phone]    5 blockages  . Cardiac catheterization    . Esophageal dilation  1997  . Colonoscopy  04/1999    1 polyp  . Esophagogastroduodenoscopy  04/1999    HH; "watermelon stomach" (severe gastritis)  . Dexa  10/2003 ,09/2005    osteoporosis,  Osteopenia  . Cataract extraction, bilateral  2003  . Esophagogastroduodenoscopy  04/2002    Gastritis  . Cardiovascular stress test  10/2002    normal (per patient)  . Abdominal hysterectomy  1977    partial; fibroids  . Bladder tack  1977  .  Colonoscopy  06/2004    Neg. Int hem  . Carotid dopplers  2006  . Refractive surgery  2006  . Opthy  11/1998;12/01;11/02  . Aortic valve replacement  09/06/2011    Procedure: AORTIC VALVE REPLACEMENT (AVR);  Surgeon: Rexene Alberts, MD;  Location: [facility];  Service: Open Heart Surgery;  Laterality: N/A;  . Coronary artery bypass graft  09/06/2011    Procedure: CORONARY ARTERY BYPASS GRAFTING (CABG);  Surgeon: Rexene Alberts, MD;  Location: [facility];  Service: Open Heart Surgery;  Laterality: N/A;  . Colonoscopy  08/2013  . Left and right heart catheterization with coronary angiogram N/A 08/11/2011    Procedure: LEFT AND RIGHT HEART CATHETERIZATION WITH CORONARY ANGIOGRAM;  Surgeon: Larey Dresser, MD;  Location[facility] C[facility] LAB;  Service: Cardiovascular;  Laterality: N/A;  . Permanent pacemaker insertion N/A May 31, 202013    Procedure: PERMANENT PACEMAKER INSERTION;  Surgeon: Deboraha Sprang, MD;  Location: [facility] CATH LAB;  Service: Cardiovascular;  Laterality: N/A;     Family History  Problem Relation Age of Onset  . Stroke Father     died in his 85's  . Stroke Mother     died in her 68's  . Diabetes Father   . Prostate cancer Son   . Diabetes Son     #2  . Hypertension Son     #3  . Thyroid disease Neg Hx   . Heart attack Neg Hx     Allergies  Allergen Reactions  . Metformin     REACTION: intolerant  . Promethazine Hcl     REACTION: u/k    Current Outpatient Prescriptions on File Prior to Visit  Medication Sig Dispense Refill  . albuterol (PROVENTIL HFA;VENTOLIN HFA) 108 (90 BASE) MCG/ACT inhaler Inhale 2 puffs into the lungs every 6 (six) hours as needed for wheezing or shortness of breath. 1 Inhaler 0  . aspirin 81 MG EC tablet Take 1 tablet (81 mg total) by mouth daily. 30 tablet 3  . atorvastatin (LIPITOR) 40 MG tablet Take 40 mg by mouth daily.    . Blood Glucose Monitoring Suppl (ONE TOUCH ULTRA MINI) W/DEVICE KIT USE AS DIRECTED TO TEST BLOOD GLUCOSE ONCE DAILY DX: 250.00 1 each 0  . Cholecalciferol (VITAMIN D-3) 1000 UNITS CAPS Take 1 capsule by mouth daily.    . fluticasone (FLONASE) 50 MCG/ACT nasal spray Place 2 sprays into both nostrils daily. 16 g 0  . furosemide (LASIX) 40 MG tablet Take 1 tablet (40 mg total) by mouth daily. 90 tablet 1  . glipiZIDE (GLUCOTROL XL) 10 MG 24 hr tablet TAKE ONE TABLET BY MOUTH IN THE MORNING 30 tablet 2  . glucose blood test strip Use as instructed to test blood glucose once daily Dx: 250.00 100 each 12  . HYDROcodone-acetaminophen (NORCO/VICODIN) 5-325 MG per tablet Take 1 tablet by mouth every 6 (six) hours as needed for moderate pain. BACK PAIN    . levothyroxine (SYNTHROID, LEVOTHROID) 50 MCG tablet Take 1 tablet (50 mcg total) by mouth daily before breakfast. 90 tablet 1  . nystatin (MYCOSTATIN) powder Apply topically 2 (two) times daily. 15 g 0  . omeprazole (PRILOSEC) 20 MG capsule Take 20 mg by mouth daily.     No current facility-administered medications on file prior to visit.     BP 95/58 mmHg  Pulse 97  Temp(Src) 98.4 F (36.9 C) (Oral)  Ht 5' 2" (1.575 m)  Wt 110 lb 6.4 oz (50.077 kg)  BMI 20.19  kg/m2  SpO2 98%       Review of Systems  Constitutional: Positive for fever and chills. Negative for fatigue.       Maybe pt is not sure.  Respiratory: Negative for cough, choking, shortness of breath and wheezing.   Cardiovascular: Negative for chest pain and palpitations.  Gastrointestinal: Negative for nausea, vomiting, abdominal pain, diarrhea, constipation, blood in stool and abdominal distention.  Genitourinary: Positive for dysuria. Negative for urgency, frequency, hematuria, flank pain, difficulty urinating, genital sores, vaginal pain and pelvic pain.       Only one time but points to area near vulva that was sore.(Pointed during exam)  Musculoskeletal: Negative for back pain.  Skin:       Diffuse skin rash with some mild vaginal rash as well.  Neurological: Negative for dizziness, seizures, syncope, facial asymmetry, speech difficulty, weakness and headaches.  Hematological: Negative for adenopathy. Does not bruise/bleed easily.       Objective:   Physical Exam   General- No acute distress. Lungs- CTA Heart-RRR Skin- scattered mild papules on her skin. Anterior and posterior thorax.  Vaginal area- very swollen red peri-vaginal region/ labia are on both sides but no blisters. No white dc. Mild pinkish red rash extends into inguinal folds but no warmth, redness or irritation.         Assessment & Plan:

## 2014-03-09 NOTE — Progress Notes (Signed)
Pre visit review using our clinic review tool, if applicable. No additional management support is needed unless otherwise documented below in the visit note. 

## 2014-03-09 NOTE — Telephone Encounter (Signed)
Caller name: Markell, Naseem Relation to pt: daughter-in-law, Barbara Garner  Call back number: [phone]    Reason for call:  Daughter in law stated mother in law is breaking out due to the antibitoics given from last visit and pt wanted to notify you and pt will be seen today with PA with [facility] Miller. Wanted to make you aware

## 2014-03-09 NOTE — Assessment & Plan Note (Signed)
Very minimal now and I am convinced most of issue is yeast infection. Will need to follow closely on Thursday since some low count mrsa on culture. Somewhat balancing act treating yeast infection vs low count mrsa in urine. Your mrsa in the urine ay be contamination from perivaginal area. Presently I think oral antibiotic may make your vaginal area worse and clinically not convinced necessary(the soft tissue in this region does not appear to have cellulitis). May need to  Start other antibiotic on Thursday. But want to see response to diflucan and mycostatin first.

## 2014-03-09 NOTE — Assessment & Plan Note (Signed)
Clinically your vaginal area looks worse than general skin condition and you may have had severe yeast infection made worse by antibiotics. Rx diflucan today and another on wed. Mycostatin to the area outside but not intravaginally.  Your mrsa in the urine ay be contamination from perivaginal area. Presently I think oral antibiotic may make your vaginal area worse and clinically not convinced necessary(the soft tissue in this region does not appear to have cellulitis). May need to  Start other antibiotic on Thursday. But want to see response to diflucan and mycostatin first.

## 2014-03-10 ENCOUNTER — Inpatient Hospital Stay (HOSPITAL_BASED_OUTPATIENT_CLINIC_OR_DEPARTMENT_OTHER)
Admission: EM | Admit: 2014-03-10 | Discharge: 2014-03-14 | DRG: 606 | Disposition: A | Discharge status: Home Health | Payer: Medicare Other | Attending: Internal Medicine | Admitting: Internal Medicine

## 2014-03-10 ENCOUNTER — Telehealth: Payer: Self-pay | Admitting: *Deleted

## 2014-03-10 ENCOUNTER — Ambulatory Visit: Payer: Medicare Other | Admitting: Physician Assistant

## 2014-03-10 ENCOUNTER — Encounter (HOSPITAL_BASED_OUTPATIENT_CLINIC_OR_DEPARTMENT_OTHER): Payer: Self-pay | Admitting: Emergency Medicine

## 2014-03-10 DIAGNOSIS — Z833 Family history of diabetes mellitus: Secondary | ICD-10-CM | POA: Diagnosis not present

## 2014-03-10 DIAGNOSIS — T378X5A Adverse effect of other specified systemic anti-infectives and antiparasitics, initial encounter: Secondary | ICD-10-CM | POA: Diagnosis present

## 2014-03-10 DIAGNOSIS — F0391 Unspecified dementia with behavioral disturbance: Secondary | ICD-10-CM | POA: Diagnosis present

## 2014-03-10 DIAGNOSIS — J45909 Unspecified asthma, uncomplicated: Secondary | ICD-10-CM | POA: Diagnosis present

## 2014-03-10 DIAGNOSIS — Z7982 Long term (current) use of aspirin: Secondary | ICD-10-CM

## 2014-03-10 DIAGNOSIS — R4182 Altered mental status, unspecified: Secondary | ICD-10-CM

## 2014-03-10 DIAGNOSIS — E119 Type 2 diabetes mellitus without complications: Secondary | ICD-10-CM | POA: Diagnosis present

## 2014-03-10 DIAGNOSIS — Z888 Allergy status to other drugs, medicaments and biological substances status: Secondary | ICD-10-CM

## 2014-03-10 DIAGNOSIS — I350 Nonrheumatic aortic (valve) stenosis: Secondary | ICD-10-CM | POA: Diagnosis present

## 2014-03-10 DIAGNOSIS — R51 Headache: Secondary | ICD-10-CM | POA: Diagnosis present

## 2014-03-10 DIAGNOSIS — I6522 Occlusion and stenosis of left carotid artery: Secondary | ICD-10-CM | POA: Diagnosis present

## 2014-03-10 DIAGNOSIS — Z8614 Personal history of Methicillin resistant Staphylococcus aureus infection: Secondary | ICD-10-CM | POA: Diagnosis not present

## 2014-03-10 DIAGNOSIS — Z950 Presence of cardiac pacemaker: Secondary | ICD-10-CM

## 2014-03-10 DIAGNOSIS — E785 Hyperlipidemia, unspecified: Secondary | ICD-10-CM | POA: Diagnosis present

## 2014-03-10 DIAGNOSIS — M810 Age-related osteoporosis without current pathological fracture: Secondary | ICD-10-CM | POA: Diagnosis present

## 2014-03-10 DIAGNOSIS — D509 Iron deficiency anemia, unspecified: Secondary | ICD-10-CM | POA: Diagnosis present

## 2014-03-10 DIAGNOSIS — E780 Pure hypercholesterolemia: Secondary | ICD-10-CM | POA: Diagnosis present

## 2014-03-10 DIAGNOSIS — I129 Hypertensive chronic kidney disease with stage 1 through stage 4 chronic kidney disease, or unspecified chronic kidney disease: Secondary | ICD-10-CM | POA: Diagnosis present

## 2014-03-10 DIAGNOSIS — I442 Atrioventricular block, complete: Secondary | ICD-10-CM | POA: Diagnosis present

## 2014-03-10 DIAGNOSIS — I447 Left bundle-branch block, unspecified: Secondary | ICD-10-CM | POA: Diagnosis present

## 2014-03-10 DIAGNOSIS — I2510 Atherosclerotic heart disease of native coronary artery without angina pectoris: Secondary | ICD-10-CM | POA: Diagnosis present

## 2014-03-10 DIAGNOSIS — T380X5A Adverse effect of glucocorticoids and synthetic analogues, initial encounter: Secondary | ICD-10-CM | POA: Diagnosis present

## 2014-03-10 DIAGNOSIS — Z90711 Acquired absence of uterus with remaining cervical stump: Secondary | ICD-10-CM | POA: Diagnosis present

## 2014-03-10 DIAGNOSIS — G609 Hereditary and idiopathic neuropathy, unspecified: Secondary | ICD-10-CM | POA: Diagnosis present

## 2014-03-10 DIAGNOSIS — I10 Essential (primary) hypertension: Secondary | ICD-10-CM | POA: Diagnosis present

## 2014-03-10 DIAGNOSIS — Z954 Presence of other heart-valve replacement: Secondary | ICD-10-CM

## 2014-03-10 DIAGNOSIS — G9340 Encephalopathy, unspecified: Secondary | ICD-10-CM | POA: Diagnosis not present

## 2014-03-10 DIAGNOSIS — Z23 Encounter for immunization: Secondary | ICD-10-CM

## 2014-03-10 DIAGNOSIS — T361X5A Adverse effect of cephalosporins and other beta-lactam antibiotics, initial encounter: Secondary | ICD-10-CM | POA: Diagnosis present

## 2014-03-10 DIAGNOSIS — I5032 Chronic diastolic (congestive) heart failure: Secondary | ICD-10-CM | POA: Diagnosis present

## 2014-03-10 DIAGNOSIS — Z8249 Family history of ischemic heart disease and other diseases of the circulatory system: Secondary | ICD-10-CM | POA: Diagnosis not present

## 2014-03-10 DIAGNOSIS — Z952 Presence of prosthetic heart valve: Secondary | ICD-10-CM

## 2014-03-10 DIAGNOSIS — M8580 Other specified disorders of bone density and structure, unspecified site: Secondary | ICD-10-CM | POA: Diagnosis present

## 2014-03-10 DIAGNOSIS — E039 Hypothyroidism, unspecified: Secondary | ICD-10-CM | POA: Diagnosis present

## 2014-03-10 DIAGNOSIS — E876 Hypokalemia: Secondary | ICD-10-CM | POA: Diagnosis not present

## 2014-03-10 DIAGNOSIS — Z79899 Other long term (current) drug therapy: Secondary | ICD-10-CM | POA: Diagnosis not present

## 2014-03-10 DIAGNOSIS — R21 Rash and other nonspecific skin eruption: Secondary | ICD-10-CM | POA: Diagnosis present

## 2014-03-10 DIAGNOSIS — L270 Generalized skin eruption due to drugs and medicaments taken internally: Secondary | ICD-10-CM | POA: Diagnosis present

## 2014-03-10 DIAGNOSIS — N179 Acute kidney failure, unspecified: Secondary | ICD-10-CM | POA: Diagnosis present

## 2014-03-10 DIAGNOSIS — H547 Unspecified visual loss: Secondary | ICD-10-CM | POA: Diagnosis present

## 2014-03-10 DIAGNOSIS — K219 Gastro-esophageal reflux disease without esophagitis: Secondary | ICD-10-CM | POA: Diagnosis present

## 2014-03-10 DIAGNOSIS — Z951 Presence of aortocoronary bypass graft: Secondary | ICD-10-CM

## 2014-03-10 DIAGNOSIS — N184 Chronic kidney disease, stage 4 (severe): Secondary | ICD-10-CM | POA: Diagnosis present

## 2014-03-10 LAB — URINALYSIS, ROUTINE W REFLEX MICROSCOPIC
BILIRUBIN URINE: NEGATIVE
Glucose, UA: NEGATIVE mg/dL
HGB URINE DIPSTICK: NEGATIVE
Ketones, ur: NEGATIVE mg/dL
Nitrite: NEGATIVE
PH: 5 (ref 5.0–8.0)
Protein, ur: NEGATIVE mg/dL
Specific Gravity, Urine: 1.013 (ref 1.005–1.030)
UROBILINOGEN UA: 0.2 mg/dL (ref 0.0–1.0)

## 2014-03-10 LAB — COMPREHENSIVE METABOLIC PANEL
ALT: 9 U/L (ref 0–35)
AST: 22 U/L (ref 0–37)
Albumin: 3.9 g/dL (ref 3.5–5.2)
Alkaline Phosphatase: 96 U/L (ref 39–117)
Anion gap: 13 (ref 5–15)
BILIRUBIN TOTAL: 0.5 mg/dL (ref 0.3–1.2)
BUN: 90 mg/dL — AB (ref 6–23)
CALCIUM: 8.7 mg/dL (ref 8.4–10.5)
CHLORIDE: 99 meq/L (ref 96–112)
CO2: 23 mmol/L (ref 19–32)
Creatinine, Ser: 2.49 mg/dL — ABNORMAL HIGH (ref 0.50–1.10)
GFR calc Af Amer: 20 mL/min — ABNORMAL LOW (ref >90)
GFR calc non Af Amer: 18 mL/min — ABNORMAL LOW (ref >90)
Glucose, Bld: 222 mg/dL — ABNORMAL HIGH (ref 70–99)
Potassium: 4.3 mmol/L (ref 3.5–5.1)
Sodium: 135 mmol/L (ref 135–145)
TOTAL PROTEIN: 7.3 g/dL (ref 6.0–8.3)

## 2014-03-10 LAB — DIFFERENTIAL
BASOS PCT: 0 % (ref 0–1)
Basophils Absolute: 0 10*3/uL (ref 0.0–0.1)
Eosinophils Absolute: 1.5 10*3/uL — ABNORMAL HIGH (ref 0.0–0.7)
Eosinophils Relative: 14 % — ABNORMAL HIGH (ref 0–5)
Lymphocytes Relative: 16 % (ref 12–46)
Lymphs Abs: 1.6 10*3/uL (ref 0.7–4.0)
MONO ABS: 0.7 10*3/uL (ref 0.1–1.0)
MONOS PCT: 7 % (ref 3–12)
Neutro Abs: 6.5 10*3/uL (ref 1.7–7.7)
Neutrophils Relative %: 63 % (ref 43–77)

## 2014-03-10 LAB — URINE MICROSCOPIC-ADD ON

## 2014-03-10 LAB — CBC
HEMATOCRIT: 33.3 % — AB (ref 36.0–46.0)
Hemoglobin: 11 g/dL — ABNORMAL LOW (ref 12.0–15.0)
MCH: 31.7 pg (ref 26.0–34.0)
MCHC: 33 g/dL (ref 30.0–36.0)
MCV: 96 fL (ref 78.0–100.0)
Platelets: 200 10*3/uL (ref 150–400)
RBC: 3.47 MIL/uL — ABNORMAL LOW (ref 3.87–5.11)
RDW: 13.6 % (ref 11.5–15.5)
WBC: 10.3 10*3/uL (ref 4.0–10.5)

## 2014-03-10 MED ORDER — DIPHENHYDRAMINE HCL 25 MG PO CAPS
25.0000 mg | ORAL_CAPSULE | Freq: Once | ORAL | Status: AC
  Administered 2014-03-10: 25 mg via ORAL
  Filled 2014-03-10: qty 1

## 2014-03-10 MED ORDER — HYDROXYZINE HCL 10 MG PO TABS
10.0000 mg | ORAL_TABLET | Freq: Once | ORAL | Status: AC
  Administered 2014-03-10: 10 mg via ORAL
  Filled 2014-03-10: qty 1

## 2014-03-10 MED ORDER — METHYLPREDNISOLONE SODIUM SUCC 125 MG IJ SOLR
60.0000 mg | Freq: Once | INTRAMUSCULAR | Status: AC
  Administered 2014-03-11: 60 mg via INTRAVENOUS
  Filled 2014-03-10: qty 2

## 2014-03-10 MED ORDER — HYDROXYZINE HCL 10 MG/5ML PO SYRP
ORAL_SOLUTION | ORAL | Status: AC
  Filled 2014-03-10: qty 1

## 2014-03-10 MED ORDER — SODIUM CHLORIDE 0.9 % IV SOLN
Freq: Once | INTRAVENOUS | Status: AC
  Administered 2014-03-10: 23:00:00 via INTRAVENOUS

## 2014-03-10 NOTE — ED Notes (Signed)
Patient reports that she has had a bacterial infection and yeast infection. She was placed on antibiotics, and diflucan. The patient saw her MD and was taken off antibiotics, and kept on diflucan. The patient remains swollen.

## 2014-03-10 NOTE — ED Notes (Signed)
Rash on pt. Arms and face have gotten better since earlier.  Pt. Reports itching is some better but not much.

## 2014-03-10 NOTE — ED Provider Notes (Signed)
CSN: [id number]     Arrival date & time 03/10/14  2008 History  This chart was scribed for Quintella Reichert, MD by Ladene Artist, ED Scribe. The patient was seen in room MH05/MH05. Patient's care was started at 8:34 PM.   Chief Complaint  Patient presents with  . Rash   The history is provided by the patient. No language interpreter was used.   HPI Comments: Barbara Garner is a 79 y.o. female, with a h/o CKD, DM, HTN, hypothyroidism, who presents to the Emergency Department complaining of gradually worsening generalized rash since yesterday. Pt describes the rash as itchy and painful. Pt's daughter-in-law states that pt has had a vaginal rash for a while. Pt was seen in office yesterday for rash and worsening yeast infection. Last dose of Keflex was 3 days ago. Yesterday she was prescribed Diflucan, Nystatin and lotion. Daughter-in-law states that vaginal blisters resolved but generalized rash has worsened since yesterday. Pt also reports facial swelling onset this morning and chronic mid back pain at this time. She denies fever, vomiting, SOB, difficulty urinating. No knew detergents, soaps or creams. Normal blood glucose is 100. Allergies to Metformin and Promethazine HCl.  Past Medical History  Diagnosis Date  . Iron deficiency anemia, unspecified   . Chronic diastolic CHF (congestive heart failure)   . CAD  S/P CABG x 3 08/2011     LIMA to diagonal branch, SVG to OM1, SVG to PDA, EVH via bilateral thighs  . Type II or unspecified type diabetes mellitus without mention of complication, not stated as uncontrolled   . Contact dermatitis and other eczema, due to unspecified cause   . Esophageal reflux   . Headache(784.0)   . Pacemaker MDT dual chamber     DOI 08/2011  . Pure hypercholesterolemia   . hypertension   . Hypothyroidism   . Atrioventricular block, complete -intermittent   . Memory loss   . Idiopathic peripheral neuropathy   . LBBB (left bundle branch block)   . Carotid stenosis      Carotid dopplers 3/34 with 35% LICA stenosis  . Severe aortic stenosis -S/P AVR      19 mm [facility] Ease pericardial tissue valve  . Extrinsic asthma, unspecified     hasn't used inhaler in past year  . CKD (chronic kidney disease) stage 4, GFR 15-29 ml/min     Cr 1.6 in 6/11, sees Dr. Arty Baumgartner  . Impaired vision     pt. reports that she identifies her meds by looking at the pillls, she is not able to read labels on bottles   Past Surgical History  Procedure Laterality Date  . Ptca  [id number]    5 blockages  . Cardiac catheterization    . Esophageal dilation  1997  . Colonoscopy  04/1999    1 polyp  . Esophagogastroduodenoscopy  04/1999    HH; "watermelon stomach" (severe gastritis)  . Dexa  10/2003 ,09/2005    osteoporosis,  Osteopenia  . Cataract extraction, bilateral  2003  . Esophagogastroduodenoscopy  04/2002    Gastritis  . Cardiovascular stress test  10/2002    normal (per patient)  . Abdominal hysterectomy  1977    partial; fibroids  . Bladder tack  1977  . Colonoscopy  06/2004    Neg. Int hem  . Carotid dopplers  2006  . Refractive surgery  2006  . Opthy  11/1998;12/01;11/02  . Aortic valve replacement  09/06/2011    Procedure: AORTIC VALVE REPLACEMENT (AVR);  Surgeon: Rexene Alberts, MD;  Location: [place];  Service: Open Heart Surgery;  Laterality: N/A;  . Coronary artery bypass graft  09/06/2011    Procedure: CORONARY ARTERY BYPASS GRAFTING (CABG);  Surgeon: Rexene Alberts, MD;  Location: [place];  Service: Open Heart Surgery;  Laterality: N/A;  . Colonoscopy  08/2013  . Left and right heart catheterization with coronary angiogram N/A 08/11/2011    Procedure: LEFT AND RIGHT HEART CATHETERIZATION WITH CORONARY ANGIOGRAM;  Surgeon: Larey Dresser, MD;  Location: [facility] CATH LAB;  Service: Cardiovascular;  Laterality: N/A;  . Permanent pacemaker insertion N/A 12/25/2011    Procedure: PERMANENT PACEMAKER INSERTION;  Surgeon: Deboraha Sprang, MD;  Location: [facility] CATH LAB;   Service: Cardiovascular;  Laterality: N/A;   Family History  Problem Relation Age of Onset  . Stroke Father     died in his 25's  . Stroke Mother     died in her 50's  . Diabetes Father   . Prostate cancer Son   . Diabetes Son     #2  . Hypertension Son     #3  . Thyroid disease Neg Hx   . Heart attack Neg Hx    History  Substance Use Topics  . Smoking status: Never Smoker   . Smokeless tobacco: Never Used  . Alcohol Use: No   OB History    No data available     Review of Systems  Constitutional: Negative for fever.  Respiratory: Negative for shortness of breath.   Gastrointestinal: Negative for vomiting.  Genitourinary: Negative for difficulty urinating.  Musculoskeletal: Positive for back pain.  Skin: Positive for rash.  All other systems reviewed and are negative.  Allergies  Metformin and Promethazine hcl  Home Medications   Prior to Admission medications   Medication Sig Start Date End Date Taking? Authorizing Provider  albuterol (PROVENTIL HFA;VENTOLIN HFA) 108 (90 BASE) MCG/ACT inhaler Inhale 2 puffs into the lungs every 6 (six) hours as needed for wheezing or shortness of breath. 02/02/14   [name], PA-C  ammonium lactate (AMLACTIN) 12 % lotion Apply 1 application topically as needed for dry skin. 03/09/14   Prentiss, PA-C  aspirin 81 MG EC tablet Take 1 tablet (81 mg total) by mouth daily. 11/14/13   [name], PA-C  atorvastatin (LIPITOR) 40 MG tablet Take 40 mg by mouth daily.    Historical Provider, MD  Blood Glucose Monitoring Suppl (ONE TOUCH ULTRA MINI) W/DEVICE KIT USE AS DIRECTED TO TEST BLOOD GLUCOSE ONCE DAILY DX: 250.00 11/14/13   [name], PA-C  Cholecalciferol (VITAMIN D-3) 1000 UNITS CAPS Take 1 capsule by mouth daily.    Historical Provider, MD  fluconazole (DIFLUCAN) 150 MG tablet Take 1 tablet (150 mg total) by mouth once. 03/09/14   [place], PA-C  fluticasone (FLONASE) 50 MCG/ACT nasal spray Place 2  sprays into both nostrils daily. 01/09/14   [name], PA-C  furosemide (LASIX) 40 MG tablet Take 1 tablet (40 mg total) by mouth daily. 09/18/13   Larey Dresser, MD  glipiZIDE (GLUCOTROL XL) 10 MG 24 hr tablet TAKE ONE TABLET BY MOUTH IN THE MORNING 02/03/14   [name], PA-C  glucose blood test strip Use as instructed to test blood glucose once daily Dx: 250.00 11/19/13   [name], PA-C  HYDROcodone-acetaminophen (NORCO/VICODIN) 5-325 MG per tablet Take 1 tablet by mouth every 6 (six) hours as needed for moderate pain. BACK  PAIN 11/14/13   [name], PA-C  levothyroxine (SYNTHROID, LEVOTHROID) 50 MCG tablet Take 1 tablet (50 mcg total) by mouth daily before breakfast. 12/10/13   [name], PA-C  nystatin (MYCOSTATIN) powder Apply topically 2 (two) times daily. 11/14/13   [name], PA-C  nystatin ointment (MYCOSTATIN) Apply 1 application topically 2 (two) times daily. 03/09/14   Meriam Sprague [place], PA-C  omeprazole (PRILOSEC) 20 MG capsule Take 20 mg by mouth daily.    Historical Provider, MD   Triage Vitals: BP 103/43 mmHg  Pulse 90  Temp(Src) 97.9 F (36.6 C) (Oral)  Resp 18  Wt 109 lb (49.442 kg)  SpO2 98% Physical Exam  Constitutional: She is oriented to person, place, and time. She appears well-developed and well-nourished.  Chronically ill appearing  HENT:  Head: Normocephalic and atraumatic.  Dry mucous membranes  Cardiovascular: Normal rate and regular rhythm.   No murmur heard. Pulmonary/Chest: Effort normal and breath sounds normal. No respiratory distress.  Abdominal: Soft. There is no tenderness. There is no rebound and no guarding.  Musculoskeletal: She exhibits no edema or tenderness.  Neurological: She is alert and oriented to person, place, and time.  Skin: Skin is warm and dry.  Diffuse maculopapular rash of face, trunk, upper extremities.  Milder on lower extremities.  Erythema and maceration of vulva without vesicles or  ulcerations  Psychiatric: She has a normal mood and affect. Her behavior is normal.  Nursing note and vitals reviewed.   ED Course  Procedures (including critical care time) DIAGNOSTIC STUDIES: Oxygen Saturation is 98% on RA, normal by my interpretation.    COORDINATION OF CARE: 8:47 PM-Discussed treatment plan which includes diagnostic lab work and Atarax with pt at bedside and pt agreed to plan.   Labs Review Labs Reviewed  COMPREHENSIVE METABOLIC PANEL - Abnormal; Notable for the following:    Glucose, Bld 222 (*)    BUN 90 (*)    Creatinine, Ser 2.49 (*)    GFR calc non Af Amer 18 (*)    GFR calc Af Amer 20 (*)    All other components within normal limits  CBC - Abnormal; Notable for the following:    RBC 3.47 (*)    Hemoglobin 11.0 (*)    HCT 33.3 (*)    All other components within normal limits  DIFFERENTIAL - Abnormal; Notable for the following:    Eosinophils Relative 14 (*)    Eosinophils Absolute 1.5 (*)    All other components within normal limits  CBC WITH DIFFERENTIAL   Imaging Review No results found.   EKG Interpretation   Date/Time:  Tuesday March 10 2014 22:52:18 EST Ventricular Rate:  65 PR Interval:  208 QRS Duration: 144 QT Interval:  458 QTC Calculation: 476 R Axis:   16 Text Interpretation:  Atrial-paced rhythm Left bundle branch block  Abnormal ECG Confirmed by Hazle Coca [phone]) on 03/10/2014 10:59:23 PM      MDM   Final diagnoses:  Rash  Acute renal failure, unspecified acute renal failure type    Patient here for evaluation of rash. Patient has diffuse maculopapular rash that is consistent with drug eruption. Patient has stopped Keflex that she was recently taking. She did recently have a dose of Diflucan. The cause of the rash is more likely to be the Keflex. Rash does not appear to be infected at this time. Labs checked given patient's report of decreased oral intake due to pain with urination and decreased urinary output.  BMP is  consistent with acute on chronic renal insufficiency with rise in her creatinine from 1.7-2.5. There is a considerable rise in her BUN 90. Given patient's baseline mild dementia and her difficulty maintaining hydrationrecommend IV fluid hydration with close observation of rash.  Current exam not c/w stevens johnson syndrome.    D/w Dr. Hal Hope regarding admission for further treatment.    I personally performed the services described in this documentation, which was scribed in my presence. The recorded information has been reviewed and is accurate.    Quintella Reichert, MD 03/10/14 [phone]

## 2014-03-10 NOTE — Telephone Encounter (Signed)
Pt did not show for appointment 03/10/2014 at 11:15am for 1 week follow up. Pt has appointment 03/12/2014 with Mackie Pai for 3 day follow up

## 2014-03-10 NOTE — Telephone Encounter (Signed)
Do not charge  

## 2014-03-10 NOTE — ED Notes (Signed)
Pt. Family reports that Pt. Has had blisters on her vagina for approx. 1 month.  Pt. Was seen by Dr. Juanetta Snow. 2 wks ago for rash on vagina with diagnosis of Bacterial infection and Yeast infection given Keflex and Diflucan.  Yesterday Pt. Seen by Dr. For worsening symptoms of rash and given nystatin and told to take Diflucan and Hold off on keflex.  Per Family the rash has gotten worse.

## 2014-03-10 NOTE — Telephone Encounter (Signed)
See note below

## 2014-03-11 ENCOUNTER — Encounter (HOSPITAL_COMMUNITY): Payer: Self-pay | Admitting: Internal Medicine

## 2014-03-11 DIAGNOSIS — N179 Acute kidney failure, unspecified: Secondary | ICD-10-CM

## 2014-03-11 DIAGNOSIS — E119 Type 2 diabetes mellitus without complications: Secondary | ICD-10-CM

## 2014-03-11 DIAGNOSIS — I10 Essential (primary) hypertension: Secondary | ICD-10-CM

## 2014-03-11 DIAGNOSIS — R21 Rash and other nonspecific skin eruption: Secondary | ICD-10-CM

## 2014-03-11 LAB — GLUCOSE, CAPILLARY
Glucose-Capillary: 211 mg/dL — ABNORMAL HIGH (ref 70–99)
Glucose-Capillary: 166 mg/dL — ABNORMAL HIGH (ref 70–99)
Glucose-Capillary: 174 mg/dL — ABNORMAL HIGH (ref 70–99)
Glucose-Capillary: 166 mg/dL — ABNORMAL HIGH (ref 70–99)

## 2014-03-11 LAB — CBC WITH DIFFERENTIAL/PLATELET
BASOS ABS: 0 10*3/uL (ref 0.0–0.1)
BASOS PCT: 0 % (ref 0–1)
Eosinophils Absolute: 0.3 10*3/uL (ref 0.0–0.7)
Eosinophils Relative: 3 % (ref 0–5)
HEMATOCRIT: 32.2 % — AB (ref 36.0–46.0)
Hemoglobin: 10.5 g/dL — ABNORMAL LOW (ref 12.0–15.0)
LYMPHS PCT: 10 % — AB (ref 12–46)
Lymphs Abs: 1 10*3/uL (ref 0.7–4.0)
MCH: 30.7 pg (ref 26.0–34.0)
MCHC: 32.6 g/dL (ref 30.0–36.0)
MCV: 94.2 fL (ref 78.0–100.0)
MONO ABS: 0.1 10*3/uL (ref 0.1–1.0)
Monocytes Relative: 1 % — ABNORMAL LOW (ref 3–12)
NEUTROS ABS: 8.3 10*3/uL — AB (ref 1.7–7.7)
NEUTROS PCT: 86 % — AB (ref 43–77)
PLATELETS: 210 10*3/uL (ref 150–400)
RBC: 3.42 MIL/uL — ABNORMAL LOW (ref 3.87–5.11)
RDW: 14.1 % (ref 11.5–15.5)
WBC: 9.6 10*3/uL (ref 4.0–10.5)

## 2014-03-11 LAB — COMPREHENSIVE METABOLIC PANEL
ALBUMIN: 3.4 g/dL — AB (ref 3.5–5.2)
ALK PHOS: 91 U/L (ref 39–117)
ALT: 12 U/L (ref 0–35)
AST: 22 U/L (ref 0–37)
Anion gap: 16 — ABNORMAL HIGH (ref 5–15)
BILIRUBIN TOTAL: 0.1 mg/dL — AB (ref 0.3–1.2)
BUN: 83 mg/dL — ABNORMAL HIGH (ref 6–23)
CHLORIDE: 101 meq/L (ref 96–112)
CO2: 18 mmol/L — AB (ref 19–32)
Calcium: 8.4 mg/dL (ref 8.4–10.5)
Creatinine, Ser: 2.4 mg/dL — ABNORMAL HIGH (ref 0.50–1.10)
GFR, EST AFRICAN AMERICAN: 21 mL/min — AB (ref >90)
GFR, EST NON AFRICAN AMERICAN: 18 mL/min — AB (ref >90)
GLUCOSE: 188 mg/dL — AB (ref 70–99)
POTASSIUM: 4 mmol/L (ref 3.5–5.1)
Sodium: 135 mmol/L (ref 135–145)
Total Protein: 6.4 g/dL (ref 6.0–8.3)

## 2014-03-11 LAB — SALICYLATE LEVEL

## 2014-03-11 LAB — SEDIMENTATION RATE: SED RATE: 33 mm/h — AB (ref 0–22)

## 2014-03-11 LAB — C-REACTIVE PROTEIN: CRP: 1.4 mg/dL — ABNORMAL HIGH (ref <0.60)

## 2014-03-11 MED ORDER — FAMOTIDINE 20 MG PO TABS: 20.0000 mg | ORAL_TABLET | Freq: Every day | ORAL | Status: DC

## 2014-03-11 MED ORDER — HYDROCODONE-ACETAMINOPHEN 5-325 MG PO TABS: 1.0000 | ORAL_TABLET | Freq: Four times a day (QID) | ORAL | Status: DC | PRN

## 2014-03-11 MED ORDER — ACETAMINOPHEN 325 MG PO TABS
650.0000 mg | ORAL_TABLET | Freq: Four times a day (QID) | ORAL | Status: DC | PRN
  Filled 2014-03-11: qty 2

## 2014-03-11 MED ORDER — LORAZEPAM 1 MG PO TABS
1.0000 mg | ORAL_TABLET | Freq: Three times a day (TID) | ORAL | Status: DC | PRN
  Administered 2014-03-11 – 2014-03-12 (×2): 1 mg via ORAL
  Filled 2014-03-11 (×2): qty 1

## 2014-03-11 MED ORDER — VITAMIN D3 25 MCG (1000 UNIT) PO TABS
1000.0000 [IU] | ORAL_TABLET | Freq: Every day | ORAL | Status: DC
  Administered 2014-03-11 – 2014-03-14 (×4): 1000 [IU] via ORAL
  Filled 2014-03-11 (×4): qty 1

## 2014-03-11 MED ORDER — METHYLPREDNISOLONE SODIUM SUCC 40 MG IJ SOLR
40.0000 mg | Freq: Every day | INTRAMUSCULAR | Status: DC
  Administered 2014-03-11: 40 mg via INTRAVENOUS
  Filled 2014-03-11 (×2): qty 1

## 2014-03-11 MED ORDER — ASPIRIN EC 81 MG PO TBEC
81.0000 mg | DELAYED_RELEASE_TABLET | Freq: Every day | ORAL | Status: DC
  Administered 2014-03-11: 81 mg via ORAL
  Filled 2014-03-11: qty 1

## 2014-03-11 MED ORDER — ENOXAPARIN SODIUM 30 MG/0.3ML ~~LOC~~ SOLN
30.0000 mg | SUBCUTANEOUS | Status: DC
  Administered 2014-03-11 – 2014-03-14 (×4): 30 mg via SUBCUTANEOUS
  Filled 2014-03-11 (×4): qty 0.3

## 2014-03-11 MED ORDER — ONDANSETRON HCL 4 MG PO TABS: 4.0000 mg | ORAL_TABLET | Freq: Four times a day (QID) | ORAL | Status: DC | PRN

## 2014-03-11 MED ORDER — FAMOTIDINE IN NACL 20-0.9 MG/50ML-% IV SOLN
20.0000 mg | Freq: Two times a day (BID) | INTRAVENOUS | Status: DC
  Administered 2014-03-11 – 2014-03-13 (×6): 20 mg via INTRAVENOUS
  Filled 2014-03-11 (×8): qty 50

## 2014-03-11 MED ORDER — INFLUENZA VAC SPLIT QUAD 0.5 ML IM SUSY
0.5000 mL | PREFILLED_SYRINGE | INTRAMUSCULAR | Status: AC
  Administered 2014-03-12: 0.5 mL via INTRAMUSCULAR
  Filled 2014-03-11: qty 0.5

## 2014-03-11 MED ORDER — ALBUTEROL SULFATE (2.5 MG/3ML) 0.083% IN NEBU: 3.0000 mL | INHALATION_SOLUTION | Freq: Four times a day (QID) | RESPIRATORY_TRACT | Status: DC | PRN

## 2014-03-11 MED ORDER — LORAZEPAM 2 MG/ML IJ SOLN: 0.5000 mg | Freq: Four times a day (QID) | INTRAMUSCULAR | Status: DC | PRN

## 2014-03-11 MED ORDER — DIPHENHYDRAMINE HCL 50 MG/ML IJ SOLN
25.0000 mg | Freq: Once | INTRAMUSCULAR | Status: AC
  Administered 2014-03-11: 25 mg via INTRAVENOUS
  Filled 2014-03-11: qty 1

## 2014-03-11 MED ORDER — SODIUM CHLORIDE 0.9 % IV SOLN
INTRAVENOUS | Status: DC
Start: 2014-03-11 — End: 2014-03-13
  Administered 2014-03-12: 09:00:00 via INTRAVENOUS

## 2014-03-11 MED ORDER — ONDANSETRON HCL 4 MG/2ML IJ SOLN: 4.0000 mg | Freq: Four times a day (QID) | INTRAMUSCULAR | Status: DC | PRN

## 2014-03-11 MED ORDER — SODIUM CHLORIDE 0.9 % IV SOLN
INTRAVENOUS | Status: DC
  Administered 2014-03-11: 07:00:00 via INTRAVENOUS

## 2014-03-11 MED ORDER — GLIPIZIDE ER 10 MG PO TB24
10.0000 mg | ORAL_TABLET | Freq: Every day | ORAL | Status: DC
  Administered 2014-03-11 – 2014-03-13 (×3): 10 mg via ORAL
  Filled 2014-03-11 (×5): qty 1

## 2014-03-11 MED ORDER — DIPHENHYDRAMINE HCL 50 MG/ML IJ SOLN
INTRAMUSCULAR | Status: AC
  Filled 2014-03-11: qty 1

## 2014-03-11 MED ORDER — LEVOTHYROXINE SODIUM 50 MCG PO TABS
50.0000 ug | ORAL_TABLET | Freq: Every day | ORAL | Status: DC
  Administered 2014-03-11 – 2014-03-14 (×4): 50 ug via ORAL
  Filled 2014-03-11 (×5): qty 1

## 2014-03-11 MED ORDER — ACETAMINOPHEN 650 MG RE SUPP: 650.0000 mg | Freq: Four times a day (QID) | RECTAL | Status: DC | PRN

## 2014-03-11 MED ORDER — INSULIN ASPART 100 UNIT/ML ~~LOC~~ SOLN
0.0000 [IU] | Freq: Three times a day (TID) | SUBCUTANEOUS | Status: DC
  Administered 2014-03-11: 3 [IU] via SUBCUTANEOUS
  Administered 2014-03-11: 5 [IU] via SUBCUTANEOUS
  Administered 2014-03-11: 3 [IU] via SUBCUTANEOUS
  Administered 2014-03-12 – 2014-03-13 (×4): 2 [IU] via SUBCUTANEOUS

## 2014-03-11 MED ORDER — AMMONIUM LACTATE 12 % EX LOTN
1.0000 "application " | TOPICAL_LOTION | CUTANEOUS | Status: DC | PRN
  Filled 2014-03-11: qty 400

## 2014-03-11 MED ORDER — ATORVASTATIN CALCIUM 40 MG PO TABS
40.0000 mg | ORAL_TABLET | Freq: Every day | ORAL | Status: DC
Start: 2014-03-11 — End: 2014-03-14
  Administered 2014-03-11 – 2014-03-14 (×4): 40 mg via ORAL
  Filled 2014-03-11 (×4): qty 1

## 2014-03-11 MED ORDER — FLUTICASONE PROPIONATE 50 MCG/ACT NA SUSP
2.0000 | Freq: Every day | NASAL | Status: DC
  Administered 2014-03-11 – 2014-03-14 (×4): 2 via NASAL
  Filled 2014-03-11: qty 16

## 2014-03-11 MED ORDER — HALOPERIDOL LACTATE 5 MG/ML IJ SOLN
1.0000 mg | Freq: Once | INTRAMUSCULAR | Status: AC
  Administered 2014-03-12: 1 mg via INTRAVENOUS
  Filled 2014-03-11: qty 1

## 2014-03-11 MED ORDER — PANTOPRAZOLE SODIUM 40 MG PO TBEC
40.0000 mg | DELAYED_RELEASE_TABLET | Freq: Every day | ORAL | Status: DC
  Administered 2014-03-11 – 2014-03-14 (×4): 40 mg via ORAL
  Filled 2014-03-11 (×4): qty 1

## 2014-03-11 MED ORDER — DONEPEZIL HCL 10 MG PO TABS: 10.0000 mg | ORAL_TABLET | Freq: Every day | ORAL | Status: DC

## 2014-03-11 MED ORDER — FAMOTIDINE IN NACL 20-0.9 MG/50ML-% IV SOLN
20.0000 mg | Freq: Two times a day (BID) | INTRAVENOUS | Status: DC
  Administered 2014-03-11: 20 mg via INTRAVENOUS
  Filled 2014-03-11 (×2): qty 50

## 2014-03-11 MED ORDER — MEMANTINE HCL ER 28 MG PO CP24: 28.0000 mg | ORAL_CAPSULE | Freq: Every day | ORAL | Status: DC

## 2014-03-11 MED ORDER — DIPHENHYDRAMINE HCL 25 MG PO CAPS
25.0000 mg | ORAL_CAPSULE | ORAL | Status: DC | PRN
  Administered 2014-03-11 – 2014-03-14 (×7): 25 mg via ORAL
  Filled 2014-03-11 (×8): qty 1

## 2014-03-11 NOTE — Progress Notes (Signed)
Patient ID: Barbara Garner  female  [id number]    DOB: 10/15/1934    DOA: 03/10/2014  PCP: Leeanne Rio, PA-C   Brief history of present illness Patient is a 79 year old female with chronic diastolic CHF, CAD, bovine aortic valve, hypertension, hyperlipidemia, hypothyroidism, chronic kidney disease, baseline 1.5-1.7 presented with itching and rash. Patient was recently treated for vulvovaginitis with Keflex and Diflucan. Patient had taken 5 day course of Keflex (12/15-12/20) and 3 day course of Diflucan. After 2 days of taking Keflex and Diflucan, patient started developing generalized itching. In the last 2 days prior to admission, patient was noticed to have diffuse maculopapular rash, no oral lesions. She denied any difficulty breathing or difficulty swallowing, no fevers. No similar rash in the family. Patient was given some Medrol and Benadryl and he began admitted for further workup.  Assessment/Plan: Principal Problem:   Generalized maculopapular rash:  - Suspect likely allergic reaction to recently received antibiotics including Keflex and Diflucan. -Continue IV Pepcid, IV Solu-Medrol, Benadryl, Vistaril as needed   Active Problems:   Essential hypertension -Currently stable  Mild acute on chronic CKD stage III  - Patient's daughter-in-law also reported patient had taken 'some aspirins (byer aspirin 81 mg)' possibly for itching,  - check salicylate level, continue IV fluids    S/P aortic valve replacement,  S/P CABG x 3 -Currently no chest pain, shortness of breath  Hypothyroidism Continue Synthroid  Hyperlipidemia Continue statins  History of chronic diastolic CHF  - Currently holding Lasix due to acute kidney injury. Prior echo in 5/15 showed EF of 123456, grade 2 diastolic dysfunction.   Recent MRSA UTI - UA and Cx pending however patient is not having any UTI symptoms, no fevers or leukocytosis. Discussed with Dr. Linus Salmons, infectious disease, stay off  antibiotics   DVT Prophylaxis: lovenox  Code Status: Full code  Family Communication: discussed with patient's son and daughter-in-law   Disposition:  Consultants: None  Procedures None  Antibiotics  none     Subjective: Patient seen and examined, diffuse rash noted on the body, no fevers or chills, nausea or vomiting  Objective  Weight change:   Intake/Output Summary (Last 24 hours) at 03/11/14 1307 Last data filed at 03/11/14 1216  Gross per 24 hour  Intake      0 ml  Output    950 ml  Net   -950 ml   Blood pressure 123/52, pulse 74, temperature 98.3 F (36.8 C), temperature source Oral, resp. rate 14, height 5\' 2"  (1.575 m), weight 50.667 kg (111 lb 11.2 oz), SpO2 96 %.  Physical Exam: General: Alert and awake, oriented x3, not in any acute distress. HEENT: anicteric sclera, PERLA, EOMI CVS: S1-S2 clear, no murmur rubs or gallops Chest: clear to auscultation bilaterally, no wheezing, rales or rhonchi Abdomen: soft nontender, nondistended, normal bowel sounds  Extremities: no cyanosis, clubbing or edema noted bilaterally Skin: diffuse maculopapular rash all over the body, face, lower extremities, trunk, upper extremities  Neuro: Cranial nerves II-XII intact, no focal neurological deficits  Lab Results: Basic Metabolic Panel:  Recent Labs Lab 03/10/14 2128 03/11/14 0530  NA 135 135  K 4.3 4.0  CL 99 101  CO2 23 18*  GLUCOSE 222* 188*  BUN 90* 83*  CREATININE 2.49* 2.40*  CALCIUM 8.7 8.4   Liver Function Tests:  Recent Labs Lab 03/10/14 2128 03/11/14 0530  AST 22 22  ALT 9 12  ALKPHOS 96 91  BILITOT 0.5 0.1*  PROT 7.3 6.4  ALBUMIN 3.9 3.4*   No results for input(s): LIPASE, AMYLASE in the last 168 hours. No results for input(s): AMMONIA in the last 168 hours. CBC:  Recent Labs Lab 03/10/14 2208 03/11/14 0530  WBC 10.3 9.6  NEUTROABS 6.5 8.3*  HGB 11.0* 10.5*  HCT 33.3* 32.2*  MCV 96.0 94.2  PLT 200 210   Cardiac Enzymes: No  results for input(s): CKTOTAL, CKMB, CKMBINDEX, TROPONINI in the last 168 hours. BNP: Invalid input(s): POCBNP CBG:  Recent Labs Lab 03/11/14 0807 03/11/14 1204  GLUCAP 211* 166*     Micro Results: Recent Results (from the past 240 hour(s))  CULTURE, URINE COMPREHENSIVE     Status: None   Collection Time: 03/05/14  5:21 PM  Result Value Ref Range Status   Culture METHICILLIN RESISTANT STAPHYLOCOCCUS AUREUS  Final   Colony Count 25,000 COLONIES/ML  Final   Organism ID, Bacteria METHICILLIN RESISTANT STAPHYLOCOCCUS AUREUS  Final    Comment: Rifampin and Gentamicin should not be used as single drugs for treatment of Staph infections.       Susceptibility   Methicillin resistant staphylococcus aureus -  (no method available)    PENICILLIN >=0.5 Resistant     OXACILLIN >=4 Resistant     CEFAZOLIN  Resistant     GENTAMICIN <=0.5 Sensitive     CIPROFLOXACIN >=8 Resistant     LEVOFLOXACIN 4 Intermediate     NITROFURANTOIN <=16 Sensitive     TRIMETH/SULFA <=10 Sensitive     VANCOMYCIN <=0.5 Sensitive     LINEZOLID 2 Sensitive     RIFAMPIN <=0.5 Sensitive     TETRACYCLINE <=1 Sensitive     Studies/Results: No results found.  Medications: Scheduled Meds: . aspirin EC  81 mg Oral Daily  . atorvastatin  40 mg Oral Daily  . cholecalciferol  1,000 Units Oral Daily  . enoxaparin (LOVENOX) injection  30 mg Subcutaneous Q24H  . famotidine (PEPCID) IV  20 mg Intravenous Q12H  . fluticasone  2 spray Each Nare Daily  . glipiZIDE  10 mg Oral Q breakfast  . [START ON 03/12/2014] Influenza vac split quadrivalent PF  0.5 mL Intramuscular Tomorrow-1000  . insulin aspart  0-15 Units Subcutaneous TID WC  . levothyroxine  50 mcg Oral QAC breakfast  . methylPREDNISolone (SOLU-MEDROL) injection  40 mg Intravenous Daily  . pantoprazole  40 mg Oral Daily      LOS: 1 day   Corwyn Vora M.D. [facility] 03/11/2014, 1:07 PM Pager: [phone]  If 7PM-7AM, please contact  night-coverage [email or web address] Password [email or web address]

## 2014-03-11 NOTE — H&P (Signed)
[facility] History and Physical  [name] AST:[id number] DOB: 1934-10-24 DOA: 03/10/2014  Referring physician: Patient was transferred from [facility]. PCP: Barbara Rio, PA-C   Chief Complaint: Generalized rash and itching.  HPI: Barbara Garner is a 79 y.o. female with history of chronic diastolic heart failure, CAD status post CABG, bovine aortic valve replacement, hypertension, hyperlipidemia, hypothyroidism, chronic kidney disease was brought to the ER patient was having persistent itching with rash. Patient was recently treated for vulvovaginitis with Keflex and Diflucan. Patient had taken 5 days course of Keflex and 3 days course of Diflucan. Patient had a 1 more dose of Diflucan yesterday. Patient took the Keflex from December 15-20. After 2 days of taking Keflex and Diflucan patient started developing itching generalized. Later last 2 days patient has been noticed to have a maculopapular rash. No oral lesions. Denies any difficulty breathing or difficulty swallowing. Has no fever. No one else in the family has skin rash. Patient was given Solu-Medrol and Benadryl in the ER and admitted for further management. In addition patient's creatinine has worsened. Patient otherwise denies any chest pain shortness of breath nausea vomiting abdominal pain or diarrhea. Patient's daughter-in-law states the patient has not been eating well.   Review of Systems: As presented in the history of presenting illness, rest negative.  Past Medical History  Diagnosis Date  . Iron deficiency anemia, unspecified   . Chronic diastolic CHF (congestive heart failure)   . CAD  S/P CABG x 3 08/2011     LIMA to diagonal branch, SVG to OM1, SVG to PDA, EVH via bilateral thighs  . Type II or unspecified type diabetes mellitus without mention of complication, not stated as uncontrolled   . Contact dermatitis and other eczema, due to unspecified cause   . Esophageal reflux   .  Headache(784.0)   . Pacemaker MDT dual chamber     DOI 08/2011  . Pure hypercholesterolemia   . hypertension   . Hypothyroidism   . Atrioventricular block, complete -intermittent   . Memory loss   . Idiopathic peripheral neuropathy   . LBBB (left bundle branch block)   . Carotid stenosis     Carotid dopplers 9/89 with 21% LICA stenosis  . Severe aortic stenosis -S/P AVR      19 mm [facility] Ease pericardial tissue valve  . Extrinsic asthma, unspecified     hasn't used inhaler in past year  . CKD (chronic kidney disease) stage 4, GFR 15-29 ml/min     Cr 1.6 in 6/11, sees Dr. Arty Garner  . Impaired vision     pt. reports that she identifies her meds by looking at the pillls, she is not able to read labels on bottles   Past Surgical History  Procedure Laterality Date  . Ptca  [phone]    5 blockages  . Cardiac catheterization    . Esophageal dilation  1997  . Colonoscopy  04/1999    1 polyp  . Esophagogastroduodenoscopy  04/1999    HH; "watermelon stomach" (severe gastritis)  . Dexa  10/2003 ,09/2005    osteoporosis,  Osteopenia  . Cataract extraction, bilateral  2003  . Esophagogastroduodenoscopy  04/2002    Gastritis  . Cardiovascular stress test  10/2002    normal (per patient)  . Abdominal hysterectomy  1977    partial; fibroids  . Bladder tack  1977  . Colonoscopy  06/2004    Neg. Int hem  . Carotid dopplers  2006  .  Refractive surgery  2006  . Opthy  11/1998;12/01;11/02  . Aortic valve replacement  09/06/2011    Procedure: AORTIC VALVE REPLACEMENT (AVR);  Surgeon: Barbara Alberts, MD;  Location: [place];  Service: Open Heart Surgery;  Laterality: N/A;  . Coronary artery bypass graft  09/06/2011    Procedure: CORONARY ARTERY BYPASS GRAFTING (CABG);  Surgeon: Barbara Alberts, MD;  Location: [place];  Service: Open Heart Surgery;  Laterality: N/A;  . Colonoscopy  08/2013  . Left and right heart catheterization with coronary angiogram N/A 08/11/2011    Procedure: LEFT AND RIGHT  HEART CATHETERIZATION WITH CORONARY ANGIOGRAM;  Surgeon: Barbara Dresser, MD;  Location: [facility] CATH LAB;  Service: Cardiovascular;  Laterality: N/A;  . Permanent pacemaker insertion N/A 04/28/202013    Procedure: PERMANENT PACEMAKER INSERTION;  Surgeon: Barbara Sprang, MD;  Location: [facility] CATH LAB;  Service: Cardiovascular;  Laterality: N/A;   Social History:  reports that she has never smoked. She has never used smokeless tobacco. She reports that she does not drink alcohol or use illicit drugs. Where does patient live  at home. Can patient participate in ADLs?  Yes.  Allergies  Allergen Reactions  . Metformin     REACTION: intolerant  . Promethazine Hcl     REACTION: u/k    Family History:  Family History  Problem Relation Age of Onset  . Stroke Father     died in his 83's  . Stroke Mother     died in her 81's  . Diabetes Father   . Prostate cancer Son   . Diabetes Son     #2  . Hypertension Son     #3  . Thyroid disease Neg Hx   . Heart attack Neg Hx       Prior to Admission medications   Medication Sig Start Date End Date Taking? Authorizing Provider  albuterol (PROVENTIL HFA;VENTOLIN HFA) 108 (90 BASE) MCG/ACT inhaler Inhale 2 puffs into the lungs every 6 (six) hours as needed for wheezing or shortness of breath. 02/02/14   Barbara Jeans, PA-C  ammonium lactate (AMLACTIN) 12 % lotion Apply 1 application topically as needed for dry skin. 03/09/14   [place], PA-C  aspirin 81 MG EC tablet Take 1 tablet (81 mg total) by mouth daily. 11/14/13   Barbara Jeans, PA-C  atorvastatin (LIPITOR) 40 MG tablet Take 40 mg by mouth daily.    Historical Provider, MD  Blood Glucose Monitoring Suppl (ONE TOUCH ULTRA MINI) W/DEVICE KIT USE AS DIRECTED TO TEST BLOOD GLUCOSE ONCE DAILY DX: 250.00 11/14/13   Barbara Jeans, PA-C  Cholecalciferol (VITAMIN D-3) 1000 UNITS CAPS Take 1 capsule by mouth daily.    Historical Provider, MD  fluconazole (DIFLUCAN) 150 MG tablet Take 1 tablet (150  mg total) by mouth once. 03/09/14   [place], PA-C  fluticasone (FLONASE) 50 MCG/ACT nasal spray Place 2 sprays into both nostrils daily. 01/09/14   Barbara Jeans, PA-C  furosemide (LASIX) 40 MG tablet Take 1 tablet (40 mg total) by mouth daily. 09/18/13   Barbara Dresser, MD  glipiZIDE (GLUCOTROL XL) 10 MG 24 hr tablet TAKE ONE TABLET BY MOUTH IN THE MORNING 02/03/14   Barbara Jeans, PA-C  glucose blood test strip Use as instructed to test blood glucose once daily Dx: 250.00 11/19/13   Barbara Jeans, PA-C  HYDROcodone-acetaminophen (NORCO/VICODIN) 5-325 MG per tablet Take 1 tablet by mouth every 6 (six) hours as needed  for moderate pain. BACK PAIN 11/14/13   Barbara Jeans, PA-C  levothyroxine (SYNTHROID, LEVOTHROID) 50 MCG tablet Take 1 tablet (50 mcg total) by mouth daily before breakfast. 12/10/13   Barbara Jeans, PA-C  nystatin (MYCOSTATIN) powder Apply topically 2 (two) times daily. 11/14/13   Barbara Jeans, PA-C  nystatin ointment (MYCOSTATIN) Apply 1 application topically 2 (two) times daily. 03/09/14   Meriam Sprague [place], PA-C  omeprazole (PRILOSEC) 20 MG capsule Take 20 mg by mouth daily.    Historical Provider, MD    Physical Exam: Filed Vitals:   03/10/14 2328 03/11/14 0148 03/11/14 0258 03/11/14 0545  BP: 121/59 124/55 131/51 123/52  Pulse: 84 74 87 74  Temp: 98.4 F (36.9 C) 98.9 F (37.2 C) 98.6 F (37 C) 98.3 F (36.8 C)  TempSrc: Oral  Oral Oral  Resp: '16 16 14 14  ' Height:   '5\' 2"'  (1.575 m)   Weight:   50.667 kg (111 lb 11.2 oz)   SpO2: 95% 96% 94% 96%     General:  Poorly built and nourished.  Eyes:  Anicteric no pallor.  ENT:  No discharge from the ears eyes nose or mouth.  Neck:  No mass felt.  Cardiovascular:  S1-S2 heard.  Respiratory:  No rhonchi or crepitations.  Abdomen:  Soft nontender bowel sounds present.  Skin:  Generalized rash all over the body including the face and trunk and lower extremities.  Maculopapular.  Musculoskeletal:  No edema.  Psychiatric:  Appears normal.  Neurologic:  Alert awake oriented to time place and person. Moves all extremities.  Labs on Admission:  Basic Metabolic Panel:  Recent Labs Lab 03/05/14 1555 03/10/14 2128  NA 139 135  K 5.2* 4.3  CL 103 99  CO2 26 23  GLUCOSE 123* 222*  BUN 62* 90*  CREATININE 1.7* 2.49*  CALCIUM 9.4 8.7   Liver Function Tests:  Recent Labs Lab 03/10/14 2128  AST 22  ALT 9  ALKPHOS 96  BILITOT 0.5  PROT 7.3  ALBUMIN 3.9   No results for input(s): LIPASE, AMYLASE in the last 168 hours. No results for input(s): AMMONIA in the last 168 hours. CBC:  Recent Labs Lab 03/10/14 2208  WBC 10.3  NEUTROABS 6.5  HGB 11.0*  HCT 33.3*  MCV 96.0  PLT 200   Cardiac Enzymes: No results for input(s): CKTOTAL, CKMB, CKMBINDEX, TROPONINI in the last 168 hours.  BNP (last 3 results)  Recent Labs  08/09/13 0850 09/16/13 1120 11/12/13 1323  PROBNP 7547.0* 11854.0* 85.0   CBG: No results for input(s): GLUCAP in the last 168 hours.  Radiological Exams on Admission: No results found.   Assessment/Plan Principal Problem:   Generalized maculopapular rash Active Problems:   Essential hypertension   S/P aortic valve replacement   S/P CABG x 3   Acute renal failure   Diabetes mellitus type 2, controlled   Skin rash  1.  generalized maculopapular rash - patient is afebrile and does not have any oral lesions. Suspect this most likely allergic reaction to recently received antibiotics which includes Keflex and Diflucan. No one else in the house has similar reaction and it's less likely to be viral or scabies. Patient otherwise has not had any new perfume soaps or detergents or any insect bites. At this time patient's daughter-in-law states that after patient received 1 dose of Solu-Medrol in the ER patient swelling of the face is considerably decreased. I have placed patient on Solu-Medrol 40 mg IV daily  along  with Benadryl when necessary and Pepcid. Closely observe. 2. Acute on chronic renal failure stage III - hold Lasix and continue with gentle hydration. I have written for normal saline for another 12 hours. Closely follow metabolic panel. 3. Diabetes mellitus type 2 - since patient is on Solu-Medrol closely follow CBGs as patient can have elevated blood sugars. 4. CAD status post CABG - denies any chest pain. 5. Hypothyroidism - continue Synthroid. 6. Hyperlipidemia - continue present medications. 7. Recently treated for vulvovaginitis. 8. Hypertension - continue present medications. 9. History of CHF - holding of diuretics due to acute renal failure. Closely follow respiratory status.    Code Status: Full code.  Family Communication: Patient's daughter-in-law at the bedside.  Disposition Plan: Admit to inpatient.    Hubbard Seldon N. [facility] Pager [phone].  If 7PM-7AM, please contact night-coverage [email or web address] Password [email or web address] 03/11/2014, 6:10 AM

## 2014-03-11 NOTE — Progress Notes (Signed)
NURSING PROGRESS NOTE  ZANAIYA KERZNER [id number] Admission Data: 03/11/2014 3:09 AM Attending Provider: Rise Patience, MD [id number], Sandria Manly, PA-C Code Status: full  NIOMI TOWSEND is a 79 y.o. female patient admitted from ED:  -No acute distress noted.  -No complaints of shortness of breath.  -No complaints of chest pain.   Blood pressure 131/51, pulse 87, temperature 98.6 F (37 C), temperature source Oral, resp. rate 14, height 5\' 2"  (1.575 m), weight 50.667 kg (111 lb 11.2 oz), SpO2 94 %.   IV Fluids:  IV in place, occlusive dsg intact without redness, IV cath forearm right, condition patent and no redness normal saline.   Allergies:  Metformin and Promethazine hcl  Past Medical History:   has a past medical history of Iron deficiency anemia, unspecified; Chronic diastolic CHF (congestive heart failure); CAD  S/P CABG x 3 (08/2011); Type II or unspecified type diabetes mellitus without mention of complication, not stated as uncontrolled; Contact dermatitis and other eczema, due to unspecified cause; Esophageal reflux; Headache(784.0); Pacemaker MDT dual chamber; Pure hypercholesterolemia; hypertension; Hypothyroidism; Atrioventricular block, complete -intermittent; Memory loss; Idiopathic peripheral neuropathy; LBBB (left bundle branch block); Carotid stenosis; Severe aortic stenosis -S/P AVR; Extrinsic asthma, unspecified; CKD (chronic kidney disease) stage 4, GFR 15-29 ml/min; and Impaired vision.  Past Surgical History:   has past surgical history that includes Mitral valve replacement [id number]); Cardiac catheterization; Esophageal dilation (1997); Colonoscopy (04/1999); Esophagogastroduodenoscopy (04/1999); DEXA (10/2003 ,09/2005); Cataract extraction, bilateral (2003); Esophagogastroduodenoscopy (04/2002); Cardiovascular stress test (10/2002); Abdominal hysterectomy (1977); bladder tack (1977); Colonoscopy (06/2004); carotid dopplers (2006); Refractive surgery (2006); Opthy  (11/1998;12/01;11/02); Aortic valve replacement (09/06/2011); Coronary artery bypass graft (09/06/2011); Colonoscopy (08/2013); left and right heart catheterization with coronary angiogram (N/A, 08/11/2011); and permanent pacemaker insertion (N/A, 07-Nov-202013).  Social History:   reports that she has never smoked. She has never used smokeless tobacco. She reports that she does not drink alcohol or use illicit drugs.  Skin: Red rash scattered all over skin, vaginal area red with rash, skin otherwise intact  Patient oriented to room. Information packet given to patient. Admission inpatient armband information verified with patient to include name and date of birth and placed on patient arm. Side rails up x 2, fall assessment and education completed with patient. Patient able to verbalize understanding of risk associated with falls and verbalized understanding to call for assistance before getting out of bed. Call light within reach. Patient able to voice and demonstrate understanding of unit orientation instructions.

## 2014-03-11 NOTE — Progress Notes (Signed)
Utilization review completed. Zeb Rawl, RN, BSN. 

## 2014-03-12 ENCOUNTER — Inpatient Hospital Stay (HOSPITAL_COMMUNITY): Payer: Medicare Other

## 2014-03-12 ENCOUNTER — Ambulatory Visit: Payer: Medicare Other | Admitting: Medical

## 2014-03-12 DIAGNOSIS — Z954 Presence of other heart-valve replacement: Secondary | ICD-10-CM

## 2014-03-12 DIAGNOSIS — L270 Generalized skin eruption due to drugs and medicaments taken internally: Secondary | ICD-10-CM | POA: Diagnosis not present

## 2014-03-12 LAB — CBC
HCT: 28 % — ABNORMAL LOW (ref 36.0–46.0)
Hemoglobin: 9.2 g/dL — ABNORMAL LOW (ref 12.0–15.0)
MCH: 31.7 pg (ref 26.0–34.0)
MCHC: 32.9 g/dL (ref 30.0–36.0)
MCV: 96.6 fL (ref 78.0–100.0)
Platelets: 194 10*3/uL (ref 150–400)
RBC: 2.9 MIL/uL — ABNORMAL LOW (ref 3.87–5.11)
RDW: 14.3 % (ref 11.5–15.5)
WBC: 12.2 10*3/uL — ABNORMAL HIGH (ref 4.0–10.5)

## 2014-03-12 LAB — BASIC METABOLIC PANEL
Anion gap: 12 (ref 5–15)
BUN: 68 mg/dL — AB (ref 6–23)
CO2: 18 mmol/L — ABNORMAL LOW (ref 19–32)
CREATININE: 1.88 mg/dL — AB (ref 0.50–1.10)
Calcium: 8.6 mg/dL (ref 8.4–10.5)
Chloride: 110 mEq/L (ref 96–112)
GFR calc Af Amer: 29 mL/min — ABNORMAL LOW (ref >90)
GFR, EST NON AFRICAN AMERICAN: 25 mL/min — AB (ref >90)
Glucose, Bld: 159 mg/dL — ABNORMAL HIGH (ref 70–99)
Potassium: 3.4 mmol/L — ABNORMAL LOW (ref 3.5–5.1)
Sodium: 140 mmol/L (ref 135–145)

## 2014-03-12 LAB — GLUCOSE, CAPILLARY
Glucose-Capillary: 147 mg/dL — ABNORMAL HIGH (ref 70–99)
Glucose-Capillary: 148 mg/dL — ABNORMAL HIGH (ref 70–99)
Glucose-Capillary: 144 mg/dL — ABNORMAL HIGH (ref 70–99)
Glucose-Capillary: 97 mg/dL (ref 70–99)

## 2014-03-12 LAB — URINE CULTURE

## 2014-03-12 MED ORDER — POTASSIUM CHLORIDE CRYS ER 20 MEQ PO TBCR
40.0000 meq | EXTENDED_RELEASE_TABLET | Freq: Once | ORAL | Status: AC
  Administered 2014-03-12: 40 meq via ORAL
  Filled 2014-03-12: qty 2

## 2014-03-12 MED ORDER — HALOPERIDOL LACTATE 5 MG/ML IJ SOLN
1.0000 mg | Freq: Once | INTRAMUSCULAR | Status: AC
  Administered 2014-03-12: 1 mg via INTRAVENOUS
  Filled 2014-03-12: qty 1

## 2014-03-12 NOTE — Progress Notes (Signed)
Patient ID: Barbara Garner  female  [id number]    DOB: 05/30/1934    DOA: 03/10/2014  PCP: Leeanne Rio, PA-C   Brief history of present illness Patient is a 79 year old female with chronic diastolic CHF, CAD, bovine aortic valve, hypertension, hyperlipidemia, hypothyroidism, chronic kidney disease, baseline 1.5-1.7 presented with itching and rash. Patient was recently treated for vulvovaginitis with Keflex and Diflucan. Patient had taken 5 day course of Keflex (12/15-12/20) and 3 day course of Diflucan. After 2 days of taking Keflex and Diflucan, patient started developing generalized itching. In the last 2 days prior to admission, patient was noticed to have diffuse maculopapular rash, no oral lesions. She denied any difficulty breathing or difficulty swallowing, no fevers. No similar rash in the family. Patient was given some Medrol and Benadryl and he began admitted for further workup.  Assessment/Plan: Principal Problem:   Generalized maculopapular rash:  - Suspect likely allergic reaction to recently received antibiotics including Keflex and Diflucan. - Continue IV Pepcid - will hold off on steroids today, patient was confused and agitated last night   Active Problems: Acute encephalopathy on dementia:  - CT head is stable, no ischemia or infarct  -  hold off on steroids today    Essential hypertension -Currently stable  Mild acute on chronic CKD stage III  - Patient's daughter-in-law also reported patient had taken 'some aspirins (byer aspirin 81 mg)' possibly for itching,  - Salicylate level normal, continue IV fluids    S/P aortic valve replacement,  S/P CABG x 3 -Currently no chest pain, shortness of breath  Hypothyroidism Continue Synthroid  Hyperlipidemia Continue statins  History of chronic diastolic CHF  - Currently holding Lasix due to acute kidney injury. Prior echo in 5/15 showed EF of 123456, grade 2 diastolic dysfunction.  - Decrease IV  fluids  Recent MRSA UTI - UA and Cx pending however patient is not having any UTI symptoms, no fevers or leukocytosis. Discussed with Dr. Linus Salmons, infectious disease, stay off antibiotics   Hypokalemia Replaced   DVT Prophylaxis: lovenox  Code Status: Full code  Family Communication: discussed with patient's son and daughter-in-law   Disposition: Hopefully DC home in a.m. if stable  Consultants: None  Procedures None  Antibiotics  none     Subjective: Patient seen and examined,  rash slightly improving however patient was very confused and agitated yesterday per family   Objective  Weight change: 0.771 kg (1 lb 11.2 oz)  Intake/Output Summary (Last 24 hours) at 03/12/14 1258 Last data filed at 03/11/14 1853  Gross per 24 hour  Intake    535 ml  Output    350 ml  Net    185 ml   Blood pressure 117/44, pulse 69, temperature 97.5 F (36.4 C), temperature source Oral, resp. rate 20, height 5\' 2"  (1.575 m), weight 50.213 kg (110 lb 11.2 oz), SpO2 95 %.  Physical Exam: General: Alert and awake, oriented x3, not in any acute distress. CVS: S1-S2 clear, no murmur rubs or gallops Chest: clear to auscultation bilaterally, no wheezing, rales or rhonchi Abdomen: soft nontender, nondistended, normal bowel sounds  Extremities: no cyanosis, clubbing or edema noted bilaterally Skin: diffuse maculopapular rash all over the body, face, lower extremities, trunk, upper extremities, improving   Lab Results: Basic Metabolic Panel:  Recent Labs Lab 03/11/14 0530 03/12/14 0600  NA 135 140  K 4.0 3.4*  CL 101 110  CO2 18* 18*  GLUCOSE 188* 159*  BUN 83* 68*  CREATININE 2.40* 1.88*  CALCIUM 8.4 8.6   Liver Function Tests:  Recent Labs Lab 03/10/14 2128 03/11/14 0530  AST 22 22  ALT 9 12  ALKPHOS 96 91  BILITOT 0.5 0.1*  PROT 7.3 6.4  ALBUMIN 3.9 3.4*   No results for input(s): LIPASE, AMYLASE in the last 168 hours. No results for input(s): AMMONIA in the last 168  hours. CBC:  Recent Labs Lab 03/11/14 0530 03/12/14 0600  WBC 9.6 12.2*  NEUTROABS 8.3*  --   HGB 10.5* 9.2*  HCT 32.2* 28.0*  MCV 94.2 96.6  PLT 210 194   Cardiac Enzymes: No results for input(s): CKTOTAL, CKMB, CKMBINDEX, TROPONINI in the last 168 hours. BNP: Invalid input(s): POCBNP CBG:  Recent Labs Lab 03/11/14 0807 03/11/14 1204 03/11/14 1643 03/11/14 2128 03/12/14 0833  GLUCAP 211* 166* 174* 166* 147*     Micro Results: Recent Results (from the past 240 hour(s))  CULTURE, URINE COMPREHENSIVE     Status: None   Collection Time: 03/05/14  5:21 PM  Result Value Ref Range Status   Culture METHICILLIN RESISTANT STAPHYLOCOCCUS AUREUS  Final   Colony Count 25,000 COLONIES/ML  Final   Organism ID, Bacteria METHICILLIN RESISTANT STAPHYLOCOCCUS AUREUS  Final    Comment: Rifampin and Gentamicin should not be used as single drugs for treatment of Staph infections.       Susceptibility   Methicillin resistant staphylococcus aureus -  (no method available)    PENICILLIN >=0.5 Resistant     OXACILLIN >=4 Resistant     CEFAZOLIN  Resistant     GENTAMICIN <=0.5 Sensitive     CIPROFLOXACIN >=8 Resistant     LEVOFLOXACIN 4 Intermediate     NITROFURANTOIN <=16 Sensitive     TRIMETH/SULFA <=10 Sensitive     VANCOMYCIN <=0.5 Sensitive     LINEZOLID 2 Sensitive     RIFAMPIN <=0.5 Sensitive     TETRACYCLINE <=1 Sensitive     Studies/Results: Ct Head Wo Contrast  03/12/2014   CLINICAL DATA:  Altered mental status  EXAM: CT HEAD WITHOUT CONTRAST  TECHNIQUE: Contiguous axial images were obtained from the base of the skull through the vertex without intravenous contrast.  COMPARISON:  09/24/2013  FINDINGS: Atrophy and chronic microvascular ischemic changes. Negative for acute infarct. Negative for hemorrhage or mass.  Calvarium intact.  Chronic sinusitis in the left sphenoid sinus  IMPRESSION: Atrophy and chronic microvascular ischemia.  No acute abnormality.   Electronically  Signed   By: Franchot Gallo M.D.   On: 03/12/2014 10:52    Medications: Scheduled Meds: . atorvastatin  40 mg Oral Daily  . cholecalciferol  1,000 Units Oral Daily  . enoxaparin (LOVENOX) injection  30 mg Subcutaneous Q24H  . famotidine (PEPCID) IV  20 mg Intravenous Q12H  . fluticasone  2 spray Each Nare Daily  . glipiZIDE  10 mg Oral Q breakfast  . Influenza vac split quadrivalent PF  0.5 mL Intramuscular Tomorrow-1000  . insulin aspart  0-15 Units Subcutaneous TID WC  . levothyroxine  50 mcg Oral QAC breakfast  . pantoprazole  40 mg Oral Daily      LOS: 2 days   RAI,RIPUDEEP M.D. [facility] 03/12/2014, 12:58 PM Pager: [phone]  If 7PM-7AM, please contact night-coverage [email or web address] Password [email or web address]

## 2014-03-12 NOTE — Care Management Note (Unsigned)
    Page 1 of 2   03/13/2014     12:40:14 PM CARE MANAGEMENT NOTE 03/13/2014  Patient:  Barbara Garner, Barbara Garner   Account Number:  [id number]  Date Initiated:  03/12/2014  Documentation initiated by:  Tomi Bamberger  Subjective/Objective Assessment:   admit- from home     Action/Plan:   Barbara Garner spoke with son to discuss HHPT, Barbara Garner, OT.   Anticipated DC Date:  03/13/2014   Anticipated DC Plan:  [place]  Barbara Garner consult      [facility] Choice  HOME HEALTH   Choice offered to / List presented to:  C-4 Adult Children        HH arranged  HH-1 Barbara Garner  [place].   Status of service:  Completed, signed off Medicare Important Message given?  YES (If response is "NO", the following Medicare IM given date fields will be blank) Date Medicare IM given:  03/12/2014 Medicare IM given by:  Tomi Bamberger Date Additional Medicare IM given:   Additional Medicare IM given by:    Discharge Disposition:    Per UR Regulation:    If discussed at Long Length of Stay Meetings, dates discussed:    Comments:  03/13/14 - 12:36   Barbara Buzzard, Barbara Garner, Barbara Garner, Barbara Garner  Met with pt's son who asked that I talk to his sister-in-law Barbara Garner). Pt lives with two sons and her daughter-in-law. Son called Barbara Garner. Spoke with Barbara Garner about [place], Barbara Garner, OT. She stated that she doesn't have a preference for a [place] agency. Discussed [facility] for referral. She agreed with [facility]. Will contact [place] for referral.

## 2014-03-13 LAB — CBC
HCT: 31.4 % — ABNORMAL LOW (ref 36.0–46.0)
HEMOGLOBIN: 10 g/dL — AB (ref 12.0–15.0)
MCH: 30.4 pg (ref 26.0–34.0)
MCHC: 31.8 g/dL (ref 30.0–36.0)
MCV: 95.4 fL (ref 78.0–100.0)
Platelets: 218 10*3/uL (ref 150–400)
RBC: 3.29 MIL/uL — AB (ref 3.87–5.11)
RDW: 14.7 % (ref 11.5–15.5)
WBC: 14.1 10*3/uL — ABNORMAL HIGH (ref 4.0–10.5)

## 2014-03-13 LAB — GLUCOSE, CAPILLARY
GLUCOSE-CAPILLARY: 145 mg/dL — AB (ref 70–99)
Glucose-Capillary: 48 mg/dL — ABNORMAL LOW (ref 70–99)
Glucose-Capillary: 154 mg/dL — ABNORMAL HIGH (ref 70–99)
Glucose-Capillary: 74 mg/dL (ref 70–99)

## 2014-03-13 LAB — BASIC METABOLIC PANEL
Anion gap: 7 (ref 5–15)
BUN: 52 mg/dL — ABNORMAL HIGH (ref 6–23)
CO2: 22 mmol/L (ref 19–32)
Calcium: 8.8 mg/dL (ref 8.4–10.5)
Chloride: 118 mEq/L — ABNORMAL HIGH (ref 96–112)
Creatinine, Ser: 1.55 mg/dL — ABNORMAL HIGH (ref 0.50–1.10)
GFR calc Af Amer: 36 mL/min — ABNORMAL LOW (ref >90)
GFR calc non Af Amer: 31 mL/min — ABNORMAL LOW (ref >90)
GLUCOSE: 51 mg/dL — AB (ref 70–99)
POTASSIUM: 3.8 mmol/L (ref 3.5–5.1)
Sodium: 147 mmol/L — ABNORMAL HIGH (ref 135–145)

## 2014-03-13 MED ORDER — FAMOTIDINE 20 MG PO TABS: 20.0000 mg | ORAL_TABLET | Freq: Two times a day (BID) | ORAL | Status: DC

## 2014-03-13 MED ORDER — HYDROCORTISONE 1 % EX CREA
TOPICAL_CREAM | Freq: Two times a day (BID) | CUTANEOUS | Status: DC
  Administered 2014-03-13 (×2): via TOPICAL
  Administered 2014-03-14: 1 via TOPICAL
  Filled 2014-03-13: qty 28

## 2014-03-13 MED ORDER — HYDROCORTISONE 1 % EX LOTN
TOPICAL_LOTION | Freq: Two times a day (BID) | CUTANEOUS | Status: DC
  Filled 2014-03-13: qty 118

## 2014-03-13 MED ORDER — HYDROCORTISONE 1 % EX LOTN
TOPICAL_LOTION | Freq: Two times a day (BID) | CUTANEOUS | Status: DC
Start: 2014-03-13 — End: 2014-03-14

## 2014-03-13 MED ORDER — DIPHENHYDRAMINE HCL 25 MG PO CAPS: 25.0000 mg | ORAL_CAPSULE | Freq: Four times a day (QID) | ORAL | Status: DC | PRN

## 2014-03-13 NOTE — Progress Notes (Signed)
Patient ID: Barbara Garner  female  [id number]    DOB: 1934/04/05    DOA: 03/10/2014  PCP: Leeanne Rio, PA-C   Brief history of present illness Patient is a 79 year old female with chronic diastolic CHF, CAD, bovine aortic valve, hypertension, hyperlipidemia, hypothyroidism, chronic kidney disease, baseline 1.5-1.7 presented with itching and rash. Patient was recently treated for vulvovaginitis with Keflex and Diflucan. Patient had taken 5 day course of Keflex (12/15-12/20) and 3 day course of Diflucan. After 2 days of taking Keflex and Diflucan, patient started developing generalized itching. In the last 2 days prior to admission, patient was noticed to have diffuse maculopapular rash, no oral lesions. She denied any difficulty breathing or difficulty swallowing, no fevers. No similar rash in the family. Patient was given some Medrol and Benadryl and he began admitted for further workup.  Assessment/Plan: Principal Problem:   Generalized maculopapular rash:  - Suspect likely allergic reaction to recently received antibiotics including Keflex and Diflucan. - Continue IV Pepcid, placed on hydrocortisone cream - Holding off on steroids, patient got confused and agitated worse with prednisone - Out of bed to chair, increase ambulation    Active Problems: Acute encephalopathy on dementia:  - CT head is stable, no ischemia or infarct  - hold off on steroids     Essential hypertension -Currently stable  Mild acute on chronic CKD stage III : Creatinine 2.49 on admission, 1.5 today - Patient's daughter-in-law also reported patient had taken 'some aspirins (byer aspirin 81 mg)' possibly for itching,  - Salicylate level normal - Creatinine function improving, KVO IV fluids    S/P aortic valve replacement,  S/P CABG x 3 -Currently no chest pain, shortness of breath  Hypothyroidism Continue Synthroid  Hyperlipidemia Continue statins  History of chronic diastolic CHF  -  Currently holding Lasix due to acute kidney injury. Prior echo in 5/15 showed EF of 123456, grade 2 diastolic dysfunction.  - saline lock IV  Recent MRSA UTI - Urine culture showed no growth   DVT Prophylaxis: lovenox  Code Status: Full code  Family Communication: discussed with patient's son at bedside and another son on the phone  Disposition: Hopefully DC home in a.m. if stable  Consultants: None  Procedures None  Antibiotics  none     Subjective: Rash is slightly improving, patient continues to have confusion, still appears somnolent   Objective  Weight change: 0.272 kg (9.6 oz)  Intake/Output Summary (Last 24 hours) at 03/13/14 1155 Last data filed at 03/12/14 1840  Gross per 24 hour  Intake 919.16 ml  Output   1000 ml  Net -80.84 ml   Blood pressure 150/54, pulse 58, temperature 97.8 F (36.6 C), temperature source Oral, resp. rate 14, height 5\' 2"  (1.575 m), weight 50.485 kg (111 lb 4.8 oz), SpO2 97 %.  Physical Exam: GeneralSomnolent but easily arousable and responds to questions CVS: S1-S2 clear, no murmur rubs or gallops Chest: CTAB Abdomen: soft nontender, nondistended, normal bowel sounds  Extremities: no cyanosis, clubbing or edema noted bilaterally Skin: diffuse maculopapular rash all over the body, face, lower extremities, trunk, upper extremities, improving   Lab Results: Basic Metabolic Panel:  Recent Labs Lab 03/12/14 0600 03/13/14 0500  NA 140 147*  K 3.4* 3.8  CL 110 118*  CO2 18* 22  GLUCOSE 159* 51*  BUN 68* 52*  CREATININE 1.88* 1.55*  CALCIUM 8.6 8.8   Liver Function Tests:  Recent Labs Lab 03/10/14 2128 03/11/14 0530  AST 22 22  ALT 9 12  ALKPHOS 96 91  BILITOT 0.5 0.1*  PROT 7.3 6.4  ALBUMIN 3.9 3.4*   No results for input(s): LIPASE, AMYLASE in the last 168 hours. No results for input(s): AMMONIA in the last 168 hours. CBC:  Recent Labs Lab 03/11/14 0530 03/12/14 0600 03/13/14 0500  WBC 9.6 12.2* 14.1*   NEUTROABS 8.3*  --   --   HGB 10.5* 9.2* 10.0*  HCT 32.2* 28.0* 31.4*  MCV 94.2 96.6 95.4  PLT 210 194 218   Cardiac Enzymes: No results for input(s): CKTOTAL, CKMB, CKMBINDEX, TROPONINI in the last 168 hours. BNP: Invalid input(s): POCBNP CBG:  Recent Labs Lab 03/12/14 1248 03/12/14 1748 03/12/14 2138 03/13/14 0803 03/13/14 1004  GLUCAP 148* 144* 97 48* 154*     Micro Results: Recent Results (from the past 240 hour(s))  CULTURE, URINE COMPREHENSIVE     Status: None   Collection Time: 03/05/14  5:21 PM  Result Value Ref Range Status   Culture METHICILLIN RESISTANT STAPHYLOCOCCUS AUREUS  Final   Colony Count 25,000 COLONIES/ML  Final   Organism ID, Bacteria METHICILLIN RESISTANT STAPHYLOCOCCUS AUREUS  Final    Comment: Rifampin and Gentamicin should not be used as single drugs for treatment of Staph infections.       Susceptibility   Methicillin resistant staphylococcus aureus -  (no method available)    PENICILLIN >=0.5 Resistant     OXACILLIN >=4 Resistant     CEFAZOLIN  Resistant     GENTAMICIN <=0.5 Sensitive     CIPROFLOXACIN >=8 Resistant     LEVOFLOXACIN 4 Intermediate     NITROFURANTOIN <=16 Sensitive     TRIMETH/SULFA <=10 Sensitive     VANCOMYCIN <=0.5 Sensitive     LINEZOLID 2 Sensitive     RIFAMPIN <=0.5 Sensitive     TETRACYCLINE <=1 Sensitive   Urine culture     Status: None   Collection Time: 03/10/14 11:08 PM  Result Value Ref Range Status   Specimen Description URINE, CLEAN CATCH  Final   Special Requests NONE  Final   Culture  Setup Time   Final    03/11/2014 08:25 Performed at [facility]   Final    5,000 COLONIES/ML Performed at [facility]    Culture   Final    INSIGNIFICANT GROWTH Performed at [facility]    Report Status 03/12/2014 FINAL  Final    Studies/Results: Ct Head Wo Contrast  03/12/2014   CLINICAL DATA:  Altered mental status  EXAM: CT HEAD WITHOUT CONTRAST  TECHNIQUE:  Contiguous axial images were obtained from the base of the skull through the vertex without intravenous contrast.  COMPARISON:  09/24/2013  FINDINGS: Atrophy and chronic microvascular ischemic changes. Negative for acute infarct. Negative for hemorrhage or mass.  Calvarium intact.  Chronic sinusitis in the left sphenoid sinus  IMPRESSION: Atrophy and chronic microvascular ischemia.  No acute abnormality.   Electronically Signed   By: Franchot Gallo M.D.   On: 03/12/2014 10:52    Medications: Scheduled Meds: . atorvastatin  40 mg Oral Daily  . cholecalciferol  1,000 Units Oral Daily  . enoxaparin (LOVENOX) injection  30 mg Subcutaneous Q24H  . famotidine (PEPCID) IV  20 mg Intravenous Q12H  . fluticasone  2 spray Each Nare Daily  . glipiZIDE  10 mg Oral Q breakfast  . hydrocortisone cream   Topical BID  . insulin aspart  0-15 Units Subcutaneous TID WC  .  levothyroxine  50 mcg Oral QAC breakfast  . pantoprazole  40 mg Oral Daily      LOS: 3 days   Johnmark Geiger M.D. [facility] 03/13/2014, 11:55 AM Pager: [phone]  If 7PM-7AM, please contact night-coverage [email or web address] Password [email or web address]

## 2014-03-14 LAB — GLUCOSE, CAPILLARY
GLUCOSE-CAPILLARY: 105 mg/dL — AB (ref 70–99)
GLUCOSE-CAPILLARY: 199 mg/dL — AB (ref 70–99)
Glucose-Capillary: 48 mg/dL — ABNORMAL LOW (ref 70–99)

## 2014-03-14 MED ORDER — FUROSEMIDE 40 MG PO TABS: 20.0000 mg | ORAL_TABLET | Freq: Every day | ORAL | Status: DC

## 2014-03-14 MED ORDER — FAMOTIDINE 20 MG PO TABS
20.0000 mg | ORAL_TABLET | Freq: Every day | ORAL | Status: DC
  Administered 2014-03-14: 20 mg via ORAL
  Filled 2014-03-14: qty 1

## 2014-03-14 MED ORDER — DIPHENHYDRAMINE HCL 25 MG PO CAPS: 25.0000 mg | ORAL_CAPSULE | Freq: Four times a day (QID) | ORAL | Status: DC | PRN

## 2014-03-14 MED ORDER — HYDROCORTISONE 1 % EX CREA: TOPICAL_CREAM | Freq: Two times a day (BID) | CUTANEOUS | Status: DC

## 2014-03-14 MED ORDER — FAMOTIDINE 20 MG PO TABS: 20.0000 mg | ORAL_TABLET | Freq: Two times a day (BID) | ORAL | Status: DC

## 2014-03-14 NOTE — Evaluation (Signed)
Physical Therapy Evaluation and D/C Patient Details Name: Barbara Garner MRN: [id number] DOB: [birth year] Today's Date: 03/14/2014   History of Present Illness    Patient is a 79 year old female with chronic diastolic CHF, CAD, bovine aortic valve, hypertension, hyperlipidemia, hypothyroidism, chronic kidney disease, baseline 1.5-1.7 presented with itching and rash. Patient was recently treated for vulvovaginitis with Keflex and Diflucan. Patient had taken 5 day course of Keflex (12/15-12/20) and 3 day course of Diflucan. After 2 days of taking Keflex and Diflucan, patient started developing generalized itching. In the last 2 days prior to admission, patient was noticed to have diffuse maculopapular rash, no oral lesions.   Clinical Impression  Pt admitted with above diagnosis. Pt currently with some  functional limitations however pt is functioning at baseline per daughter in law and family feels they can take care of her at current level and will provide 24 hour care for pt.  Pt has no skilled PT needs at this time due to functioning at baseline.  Will sign off.  Thanks.   Follow Up Recommendations No PT follow up (Family declines stating "we know what to do.")    Equipment Recommendations  None recommended by PT    Recommendations for Other Services       Precautions / Restrictions Precautions Precautions: Fall Restrictions Weight Bearing Restrictions: No      Mobility  Bed Mobility Overal bed mobility: Needs Assistance Bed Mobility: Supine to Sit     Supine to sit: Supervision        Transfers Overall transfer level: Needs assistance Equipment used: None Transfers: Sit to/from Stand Sit to Stand: Min guard         General transfer comment: steadying assist needed  Ambulation/Gait Ambulation/Gait assistance: Min guard;Min assist Ambulation Distance (Feet): 175 Feet Assistive device: None Gait Pattern/deviations: Step-through pattern;Decreased stride length;Narrow  base of support;Trunk flexed;Decreased step length - right;Decreased step length - left   Gait velocity interpretation: Below normal speed for age/gender General Gait Details: Pt with shortened step length bil feet with ambulation making balance unsteady at times.  Pt demonstrates lack of balance reactions as well. Daughter in law states pt is at her baseline.  Her son always walks with her and acts as her cane.  Pt and daughter in law not interested in using a device at present. Daughter in law states that pts son ambulates with pt all the time and is "like her cane to hold onto".  Pt and daughter in law understand PT recommends use of RW for safety on d/c.    Stairs            Wheelchair Mobility    Modified Rankin (Stroke Patients Only)       Balance Overall balance assessment: Needs assistance;History of Falls         Standing balance support: No upper extremity supported;During functional activity Standing balance-Leahy Scale: Poor Standing balance comment: needed single UE support for static and dynamic balance.  Pt generally unsteady on her feet.              High level balance activites: Direction changes;Turns;Sudden stops High Level Balance Comments: Pt needed min assist with the above.              Pertinent Vitals/Pain Pain Assessment: No/denies pain  VSS    Home Living Family/patient expects to be discharged to:: Private residence Living Arrangements: Children Available Help at Discharge: Family;Available 24 hours/day (close to 24 hours) Type of Home: [place]  Access: Stairs to enter Entrance Stairs-Rails: Right;Left;Can reach both Entrance Stairs-Number of Steps: 5 Home Layout: Multi-level;Able to live on main level with bedroom/bathroom Home Equipment: Kasandra Knudsen - single point;Walker - standard;Walker - 2 wheels Additional Comments: daughter in law reports she will have close to 24 hour care as well as assist when doing steps    Prior Function  Level of Independence: Needs assistance   Gait / Transfers Assistance Needed: used cane at times but daughter in law states she just doesn't want to use it so she doesnt.  ADL's / Homemaking Assistance Needed: total care bathing/dressing; caregiver with cooking and housework. pt feeds herself  Comments: daughter in law states that pt is going to move and live with her and son due to unsure if other son is adequately caring for the pt.  Pts other son will still be there to help pt but this son can then monitor pts meds etc for her.       Hand Dominance   Dominant Hand: Right    Extremity/Trunk Assessment   Upper Extremity Assessment: Defer to OT evaluation           Lower Extremity Assessment: Generalized weakness         Communication   Communication: No difficulties  Cognition Arousal/Alertness: Awake/alert Behavior During Therapy: Restless Overall Cognitive Status: History of cognitive impairments - at baseline Area of Impairment: Following commands;Safety/judgement;Awareness     Memory: Decreased short-term memory Following Commands: Follows one step commands with increased time Safety/Judgement: Decreased awareness of safety;Decreased awareness of deficits Awareness: Intellectual   General Comments: Pt has periods of confusion.    General Comments      Exercises        Assessment/Plan    PT Assessment Patent does not need any further PT services  PT Diagnosis Generalized weakness   PT Problem List Decreased activity tolerance;Decreased balance;Decreased mobility;Decreased knowledge of use of DME;Decreased safety awareness;Decreased knowledge of precautions  PT Treatment Interventions Gait training;Balance training;Patient/family education   PT Goals (Current goals can be found in the Care Plan section) Acute Rehab PT Goals Patient Stated Goal: to go home    Frequency     Barriers to discharge        Co-evaluation               End of  Session Equipment Utilized During Treatment: Gait belt Activity Tolerance: Patient limited by fatigue Patient left: in bed;with call bell/phone within reach;with bed alarm set;with family/visitor present Nurse Communication: Mobility status         Time: [id number] PT Time Calculation (min) (ACUTE ONLY): 14 min   Charges:   PT Evaluation $Initial PT Evaluation Tier I: 1 Procedure PT Treatments $Gait Training: 8-22 mins   PT G CodesDenice Garner 03/25/2014, 1:44 PM [facility] [facility] Acute Rehabilitation [phone] (pager)

## 2014-03-14 NOTE — Progress Notes (Signed)
Nsg Discharge Note  Admit Date:  03/10/2014 Discharge date: 03/14/2014   Barbara Garner to be D/C'd Home with home health per MD order.  AVS completed.  Copy for chart, and copy for patient signed, and dated. Patient/caregiver able to verbalize understanding.  Discharge Medication:   Medication List    STOP taking these medications        fluconazole 150 MG tablet  Commonly known as:  DIFLUCAN     omeprazole 20 MG capsule  Commonly known as:  PRILOSEC      TAKE these medications        albuterol 108 (90 BASE) MCG/ACT inhaler  Commonly known as:  PROVENTIL HFA;VENTOLIN HFA  Inhale 2 puffs into the lungs every 6 (six) hours as needed for wheezing or shortness of breath.     ammonium lactate 12 % lotion  Commonly known as:  AMLACTIN  Apply 1 application topically as needed for dry skin.     aspirin 81 MG EC tablet  Take 1 tablet (81 mg total) by mouth daily.     atorvastatin 40 MG tablet  Commonly known as:  LIPITOR  Take 40 mg by mouth daily.     carvedilol 25 MG tablet  Commonly known as:  COREG  Take 12.5 mg by mouth 2 (two) times daily after a meal.     diphenhydrAMINE 25 mg capsule  Commonly known as:  BENADRYL  Take 1 capsule (25 mg total) by mouth every 6 (six) hours as needed for itching or allergies.     famotidine 20 MG tablet  Commonly known as:  PEPCID  Take 1 tablet (20 mg total) by mouth 2 (two) times daily. X 10days     fluticasone 50 MCG/ACT nasal spray  Commonly known as:  FLONASE  Place 2 sprays into both nostrils daily.     furosemide 40 MG tablet  Commonly known as:  LASIX  Take 0.5 tablets (20 mg total) by mouth daily.  Start taking on:  03/17/2014     glipiZIDE 10 MG 24 hr tablet  Commonly known as:  GLUCOTROL XL  TAKE ONE TABLET BY MOUTH IN THE MORNING     glucose blood test strip  Use as instructed to test blood glucose once daily Dx: 250.00     HYDROcodone-acetaminophen 5-325 MG per tablet  Commonly known as:  NORCO/VICODIN   Take 1 tablet by mouth every 6 (six) hours as needed for moderate pain. BACK PAIN     hydrocortisone cream 1 %  Apply topically 2 (two) times daily. Apply to rash BID until rash cleared. Avoid face and eye area.     levothyroxine 50 MCG tablet  Commonly known as:  SYNTHROID, LEVOTHROID  Take 1 tablet (50 mcg total) by mouth daily before breakfast.     nystatin ointment  Commonly known as:  MYCOSTATIN  Apply 1 application topically 2 (two) times daily.     ONE TOUCH ULTRA MINI W/DEVICE Kit  USE AS DIRECTED TO TEST BLOOD GLUCOSE ONCE DAILY DX: 250.00     Vitamin D-3 1000 UNITS Caps  Take 1 capsule by mouth daily.        Discharge Assessment: Filed Vitals:   03/14/14 1348  BP: 142/60  Pulse:   Temp: 98.2 F (36.8 C)  Resp:    Skin clean, dry and intact without evidence of skin break down, no evidence of skin tears noted. Rash looks like it has improved.  IV catheter discontinued intact. Site without signs  and symptoms of complications - no redness or edema noted at insertion site, patient denies c/o pain - only slight tenderness at site.  Dressing with slight pressure applied.  D/c Instructions-Education: Discharge instructions given to patient/family with verbalized understanding. D/c education completed with patient/family including follow up instructions, medication list, d/c activities limitations if indicated, with other d/c instructions as indicated by MD - patient able to verbalize understanding, all questions fully answered. Patient instructed to return to ED, call 911, or call MD for any changes in condition.  Patient escorted via [place], and D/C home via private auto.  Dayle Points, RN 03/14/2014 3:00 PM

## 2014-03-14 NOTE — Discharge Summary (Signed)
Physician Discharge Summary  Patient ID: Barbara Garner MRN: [id number] DOB/AGE: Feb 22, 1935 79 y.o.  Admit date: 03/10/2014 Discharge date: 03/14/2014  Primary Care Physician:  Leeanne Rio, PA-C  Discharge Diagnoses:    . Acute renal failure . Generalized maculopapular rash . Essential hypertension . Acute encephalopathy likely worsened due to steroids and sundowning   Diabetes mellitus  Consults: None   Recommendations for Outpatient Follow-up:  Patient was noted to be in acute renal failure at the time of admission, creatinine 2.49, Lasix was recently increased. Patient is recommended to hold Lasix for another 2 days and restart at 20 mg daily (rather than alternating 99m and 20 mg daily)  TESTS THAT NEED FOLLOW-UP BMET at follow-up   DIET: Carb modified diet   Allergies:   Allergies  Allergen Reactions  . Metformin     REACTION: intolerant  . Promethazine Hcl     REACTION: u/k     Discharge Medications:   Medication List    STOP taking these medications        fluconazole 150 MG tablet  Commonly known as:  DIFLUCAN     omeprazole 20 MG capsule  Commonly known as:  PRILOSEC      TAKE these medications        albuterol 108 (90 BASE) MCG/ACT inhaler  Commonly known as:  PROVENTIL HFA;VENTOLIN HFA  Inhale 2 puffs into the lungs every 6 (six) hours as needed for wheezing or shortness of breath.     ammonium lactate 12 % lotion  Commonly known as:  AMLACTIN  Apply 1 application topically as needed for dry skin.     aspirin 81 MG EC tablet  Take 1 tablet (81 mg total) by mouth daily.     atorvastatin 40 MG tablet  Commonly known as:  LIPITOR  Take 40 mg by mouth daily.     carvedilol 25 MG tablet  Commonly known as:  COREG  Take 12.5 mg by mouth 2 (two) times daily after a meal.     diphenhydrAMINE 25 mg capsule  Commonly known as:  BENADRYL  Take 1 capsule (25 mg total) by mouth every 6 (six) hours as needed for itching or allergies.      famotidine 20 MG tablet  Commonly known as:  PEPCID  Take 1 tablet (20 mg total) by mouth 2 (two) times daily. X 10days     fluticasone 50 MCG/ACT nasal spray  Commonly known as:  FLONASE  Place 2 sprays into both nostrils daily.     furosemide 40 MG tablet  Commonly known as:  LASIX  Take 0.5 tablets (20 mg total) by mouth daily.  Start taking on:  03/17/2014     glipiZIDE 10 MG 24 hr tablet  Commonly known as:  GLUCOTROL XL  TAKE ONE TABLET BY MOUTH IN THE MORNING     glucose blood test strip  Use as instructed to test blood glucose once daily Dx: 250.00     HYDROcodone-acetaminophen 5-325 MG per tablet  Commonly known as:  NORCO/VICODIN  Take 1 tablet by mouth every 6 (six) hours as needed for moderate pain. BACK PAIN     hydrocortisone cream 1 %  Apply topically 2 (two) times daily. Apply to rash BID until rash cleared. Avoid face and eye area.     levothyroxine 50 MCG tablet  Commonly known as:  SYNTHROID, LEVOTHROID  Take 1 tablet (50 mcg total) by mouth daily before breakfast.     nystatin ointment  Commonly known as:  MYCOSTATIN  Apply 1 application topically 2 (two) times daily.     ONE TOUCH ULTRA MINI W/DEVICE Kit  USE AS DIRECTED TO TEST BLOOD GLUCOSE ONCE DAILY DX: 250.00     Vitamin D-3 1000 UNITS Caps  Take 1 capsule by mouth daily.         Brief H and P: For complete details please refer to admission H and P, but in brief Patient is a 79 year old female with chronic diastolic CHF, CAD, bovine aortic valve, hypertension, hyperlipidemia, hypothyroidism, chronic kidney disease, baseline 1.5-1.7 presented with itching and rash. Patient was recently treated for vulvovaginitis with Keflex and Diflucan. Patient had taken 5 day course of Keflex (12/15-12/20) and 3 day course of Diflucan. After 2 days of taking Keflex and Diflucan, patient started developing generalized itching. In the last 2 days prior to admission, patient was noticed to have diffuse  maculopapular rash, no oral lesions. She denied any difficulty breathing or difficulty swallowing, no fevers. No similar rash in the family. Patient was given some Medrol and Benadryl and he began admitted for further workup.  Hospital Course:  Generalized maculopapular rash:  Suspect likely allergic reaction to recently received antibiotics including Keflex and Diflucan. Patient was placed on IV Pepcid, IV steroids and hydrocortisone cream. The rash has been progressively improving. Patient however became confused and hallucinating with steroids. Prednisone was discontinued.    Acute encephalopathy on dementia: Likely due to steroids CT head was done which was stable with no ischemia or infarct or intracranial pathology. Likely patient had worsening mental status due to steroids and underlying component of dementia with sundowning. Once the steroids were discontinued, patient had remarkable improvement in the mental status. She is currently at her baseline mental status at discharge, confirmed by the family members at the bedside.   Essential hypertension -Currently stable  Mild acute on chronic CKD stage III : Creatinine 2.49 on admission,  improved to 1.5 - Patient's daughter-in-law also reported patient had taken 'some aspirins (byer aspirin 81 mg)' possibly for itching,  salicylate level was normal. Creatinine function has been improving. Daughter-in-law reported that her Lasix was recently increased and patient was taking 40 mg alternating with 20 mg daily.She was recommended to hold off on Lasix for another 2 days, then continue only 20 mg daily.   S/P aortic valve replacement, S/P CABG x 3 -Currently no chest pain, shortness of breath  Hypothyroidism- Continue Synthroid  Hyperlipidemia Continue statins  History of chronic diastolic CHF  Currently euvolemic, Lasix was placed on hold due to acute renal insufficiency, creatinine of 2.49 at the time of admission. Patient is not  recommended to continue 20 mg daily Lasix rather than the alternating doses of 40 mg and 20 mg.   Recent MRSA UTI- Urine culture showed no growth   Day of Discharge BP 149/49 mmHg  Pulse 67  Temp(Src) 98.9 F (37.2 C) (Oral)  Resp 17  Ht _0  (1.575 m)  Wt 51.393 kg (113 lb 4.8 oz)  BMI 20.72 kg/m2  SpO2 96%  Physical Exam: General: Alert and awake oriented x3 not in any acute distress. CVS: S1-S2 clear no murmur rubs or gallops Chest: clear to auscultation bilaterally, no wheezing rales or rhonchi Abdomen: soft nontender, nondistended, normal bowel sounds Extremities: no cyanosis, clubbing or edema noted bilaterally Skin: Rash improving Neuro: Cranial nerves II-XII intact, no focal neurological deficits   The results of significant diagnostics from this hospitalization (including imaging, microbiology, ancillary and laboratory) are  listed below for reference.    LAB RESULTS: Basic Metabolic Panel:  Recent Labs Lab 03/12/14 0600 03/13/14 0500  NA 140 147*  K 3.4* 3.8  CL 110 118*  CO2 18* 22  GLUCOSE 159* 51*  BUN 68* 52*  CREATININE 1.88* 1.55*  CALCIUM 8.6 8.8   Liver Function Tests:  Recent Labs Lab 03/10/14 2128 03/11/14 0530  AST 22 22  ALT 9 12  ALKPHOS 96 91  BILITOT 0.5 0.1*  PROT 7.3 6.4  ALBUMIN 3.9 3.4*   No results for input(s): LIPASE, AMYLASE in the last 168 hours. No results for input(s): AMMONIA in the last 168 hours. CBC:  Recent Labs Lab 03/11/14 0530 03/12/14 0600 03/13/14 0500  WBC 9.6 12.2* 14.1*  NEUTROABS 8.3*  --   --   HGB 10.5* 9.2* 10.0*  HCT 32.2* 28.0* 31.4*  MCV 94.2 96.6 95.4  PLT 210 194 218   Cardiac Enzymes: No results for input(s): CKTOTAL, CKMB, CKMBINDEX, TROPONINI in the last 168 hours. BNP: Invalid input(s): POCBNP CBG:  Recent Labs Lab 03/14/14 0757 03/14/14 0833  GLUCAP 48* 105*    Significant Diagnostic Studies:  No results found.     Disposition and Follow-up:    DISPOSITION:  Home with home health PT, OT, RN   DISCHARGE FOLLOW-UP     Follow-up Information    Follow up with Leeanne Rio, PA-C. Schedule an appointment as soon as possible for a visit in 10 days.   Specialty:  Physician Assistant   Why:  for hospital follow-up   Contact information:   [address] [place] [address] [phone]       Follow up with [facility].   Why:  home health physical and occupational therapy and nurse   Contact information:   [address] [place] [address] [phone]        Time spent on Discharge: 39 mins  Signed:   Ledell Codrington M.D. [facility] 03/14/2014, 11:44 AM Pager: [phone]

## 2014-03-16 ENCOUNTER — Telehealth: Payer: Self-pay | Admitting: Physician Assistant

## 2014-03-16 ENCOUNTER — Other Ambulatory Visit: Payer: Self-pay

## 2014-03-16 DIAGNOSIS — E119 Type 2 diabetes mellitus without complications: Secondary | ICD-10-CM

## 2014-03-16 NOTE — Telephone Encounter (Signed)
Caller name: guelda stankus Relation to pt: daughter in law Call back number: [phone] [facility]: [facility] [address]  Reason for call:   Wants rx for glucose blood strips

## 2014-03-17 MED ORDER — GLUCOSE BLOOD VI STRP: ORAL_STRIP | Status: DC

## 2014-03-17 NOTE — Telephone Encounter (Signed)
Med filled, per pharmacy they needed a prescription with an updated ICD-10 code.

## 2014-03-18 ENCOUNTER — Telehealth: Payer: Self-pay | Admitting: *Deleted

## 2014-03-18 ENCOUNTER — Telehealth: Payer: Self-pay

## 2014-03-18 NOTE — Telephone Encounter (Signed)
Admit date: 03/10/2014 Discharge date: 03/14/2014  Reason for admission:  Acute Renal Failure, Rash  Recommendations for Outpatient Follow-up:  Patient was noted to be in acute renal failure at the time of admission, creatinine 2.49, Lasix was recently increased. Patient is recommended to hold Lasix for another 2 days and restart at 20 mg daily (rather than alternating 40mg  and 20 mg daily)  TESTS THAT NEED FOLLOW-UP BMET at follow-up  Information below provided by patient's son  Transition Care Management Follow-up Telephone Call  How have you been since you were released from the hospital? States mother is improving.  Rash is almost resolved.  Kidney failure stable.  Son's wife stays with patient majority of the time.     Do you understand why you were in the hospital? yes   Do you understand the discharge instructions? yes  Items Reviewed:  Medications reviewed: no, son unfamiliar with meds.  Son instructed to bring meds in with him during next visit.    Allergies reviewed: yes  Dietary changes reviewed: Carb modified diet  Referrals reviewed: orders written for PT/OT/Nursing    Functional Questionnaire:   Activities of Daily Living (ADLs):   She states they are independent in the following: total care per son States they require assistance with the following: ambulation, bathing and hygiene, feeding, continence, grooming, toileting and dressing   Any transportation issues/concerns?: no, son provides transportation and will be bring patient to appointment   Any patient concerns? no   Confirmed importance and date/time of follow-up visits scheduled: yes   Confirmed with patient if condition begins to worsen call PCP or go to the ER: yes   Hospital follow up appointment scheduled for 03/24/14 @ 1:30 pm with Elyn Aquas, PA-C.

## 2014-03-18 NOTE — Telephone Encounter (Signed)
Prior authorization initiated for OneTouch Ultra test strips. Awaiting determination. JG//CMA

## 2014-03-19 NOTE — Telephone Encounter (Signed)
Request denied under Medicare Part D and was forwarded to Medicare Part B for authorization.  Received fax regarding Medicare Part B stating authorization is not required.

## 2014-03-23 ENCOUNTER — Other Ambulatory Visit (INDEPENDENT_AMBULATORY_CARE_PROVIDER_SITE_OTHER): Payer: Medicare Other | Admitting: *Deleted

## 2014-03-23 DIAGNOSIS — E875 Hyperkalemia: Secondary | ICD-10-CM

## 2014-03-23 LAB — BASIC METABOLIC PANEL
BUN: 44 mg/dL — AB (ref 6–23)
CHLORIDE: 101 meq/L (ref 96–112)
CO2: 28 mEq/L (ref 19–32)
Calcium: 8.8 mg/dL (ref 8.4–10.5)
Creatinine, Ser: 1.9 mg/dL — ABNORMAL HIGH (ref 0.4–1.2)
GFR: 27.01 mL/min — AB (ref >60.00)
Glucose, Bld: 285 mg/dL — ABNORMAL HIGH (ref 70–99)
POTASSIUM: 4.6 meq/L (ref 3.5–5.1)
SODIUM: 140 meq/L (ref 135–145)

## 2014-03-24 ENCOUNTER — Telehealth: Payer: Self-pay | Admitting: *Deleted

## 2014-03-24 ENCOUNTER — Ambulatory Visit (INDEPENDENT_AMBULATORY_CARE_PROVIDER_SITE_OTHER): Payer: Medicare Other | Admitting: Physician Assistant

## 2014-03-24 ENCOUNTER — Encounter: Payer: Self-pay | Admitting: Physician Assistant

## 2014-03-24 VITALS — BP 124/67 | HR 79 | Temp 97.6°F | Resp 16 | Ht 62.0 in | Wt 110.5 lb

## 2014-03-24 DIAGNOSIS — R21 Rash and other nonspecific skin eruption: Secondary | ICD-10-CM

## 2014-03-24 DIAGNOSIS — F05 Delirium due to known physiological condition: Secondary | ICD-10-CM

## 2014-03-24 DIAGNOSIS — I10 Essential (primary) hypertension: Secondary | ICD-10-CM

## 2014-03-24 DIAGNOSIS — N179 Acute kidney failure, unspecified: Secondary | ICD-10-CM

## 2014-03-24 NOTE — Patient Instructions (Signed)
Take Glipizide as follows -- 10 mg AM and 5 mg PM Continue monitoring fasting blood sugar.  Write down.  Call me with these results in 1 week.  Continue other medications as directed except for the Lasix.  STOP the Lasix. Weigh Barbara Garner daily.  If weight increases by 3 lbs in 24 hours or 5 pounds in 4-5 days, please call the office.  Follow-up with Cardiology in 1 week as scheduled.  I am sending her urine for culture.  If infection is present we will treat.  Follow-up with me in 2 weeks.

## 2014-03-24 NOTE — Progress Notes (Signed)
Pre visit review using our clinic review tool, if applicable. No additional management support is needed unless otherwise documented below in the visit note/SLS  

## 2014-03-24 NOTE — Progress Notes (Signed)
Patient presents to clinic today for hospital follow-up of Generalized rash, Acute Renal failure and Acute Encephalopathy in patient with baseline dementia.   Patient admitted to hospital on 03/10/14 after being seen in ER for worsening rash.  Was given IV Solumedrol with improvement in rash but increased agitation and confusion.  Labs revealed acute renal failure with Cr at 2.49 thought to be secondary to recent antibiotic and use of Lasix. CT Head without concerning findings. Mental status improved with discontinuation of steroids. Renal function began to improve with medication changes.  Cr improved to 1.5 at time of discharge.  Since discharge daughter says patient has been doing well overall.  Denies swelling of legs off of Lasix.  Denies patient complaining of chest pain, palpitations, SOB.  Denies recurrence of rash.  Has noted patient seems to be having increased difficulty with memory and having increased irritability, most prominent at night.  Past Medical History  Diagnosis Date  . Iron deficiency anemia, unspecified   . Chronic diastolic CHF (congestive heart failure)   . CAD  S/P CABG x 3 08/2011     LIMA to diagonal branch, SVG to OM1, SVG to PDA, EVH via bilateral thighs  . Type II or unspecified type diabetes mellitus without mention of complication, not stated as uncontrolled   . Contact dermatitis and other eczema, due to unspecified cause   . Esophageal reflux   . Headache(784.0)   . Pacemaker MDT dual chamber     DOI 08/2011  . Pure hypercholesterolemia   . hypertension   . Hypothyroidism   . Atrioventricular block, complete -intermittent   . Memory loss   . Idiopathic peripheral neuropathy   . LBBB (left bundle branch block)   . Carotid stenosis     Carotid dopplers 9/93 with 71% LICA stenosis  . Severe aortic stenosis -S/P AVR      19 mm [facility] Ease pericardial tissue valve  . Extrinsic asthma, unspecified     hasn't used inhaler in past year  . CKD  (chronic kidney disease) stage 4, GFR 15-29 ml/min     Cr 1.6 in 6/11, sees Dr. Arty Baumgartner  . Impaired vision     pt. reports that she identifies her meds by looking at the pillls, she is not able to read labels on bottles    Current Outpatient Prescriptions on File Prior to Visit  Medication Sig Dispense Refill  . albuterol (PROVENTIL HFA;VENTOLIN HFA) 108 (90 BASE) MCG/ACT inhaler Inhale 2 puffs into the lungs every 6 (six) hours as needed for wheezing or shortness of breath. 1 Inhaler 0  . ammonium lactate (AMLACTIN) 12 % lotion Apply 1 application topically as needed for dry skin. 225 g 0  . aspirin 81 MG EC tablet Take 1 tablet (81 mg total) by mouth daily. 30 tablet 3  . atorvastatin (LIPITOR) 40 MG tablet Take 40 mg by mouth daily.    . Blood Glucose Monitoring Suppl (ONE TOUCH ULTRA MINI) W/DEVICE KIT USE AS DIRECTED TO TEST BLOOD GLUCOSE ONCE DAILY DX: 250.00 1 each 0  . carvedilol (COREG) 25 MG tablet Take 12.5 mg by mouth 2 (two) times daily after a meal.    . Cholecalciferol (VITAMIN D-3) 1000 UNITS CAPS Take 1 capsule by mouth daily.    . diphenhydrAMINE (BENADRYL) 25 mg capsule Take 1 capsule (25 mg total) by mouth every 6 (six) hours as needed for itching or allergies. 60 capsule 0  . fluticasone (FLONASE) 50 MCG/ACT nasal spray Place  2 sprays into both nostrils daily. 16 g 0  . furosemide (LASIX) 40 MG tablet Take 0.5 tablets (20 mg total) by mouth daily. 90 tablet 1  . glipiZIDE (GLUCOTROL XL) 10 MG 24 hr tablet TAKE ONE TABLET BY MOUTH IN THE MORNING 30 tablet 2  . glucose blood test strip Use as instructed to test blood glucose once daily Dx: E11.9 100 each 12  . HYDROcodone-acetaminophen (NORCO/VICODIN) 5-325 MG per tablet Take 1 tablet by mouth every 6 (six) hours as needed for moderate pain. BACK PAIN    . hydrocortisone cream 1 % Apply topically 2 (two) times daily. Apply to rash BID until rash cleared. Avoid face and eye area. 30 g 3  . levothyroxine (SYNTHROID,  LEVOTHROID) 50 MCG tablet Take 1 tablet (50 mcg total) by mouth daily before breakfast. 90 tablet 1  . nystatin ointment (MYCOSTATIN) Apply 1 application topically 2 (two) times daily. 30 g 1   No current facility-administered medications on file prior to visit.    Allergies  Allergen Reactions  . Keflex [Cephalexin]     rash  . Metformin     REACTION: intolerant  . Promethazine Hcl     REACTION: u/k  . Diflucan [Fluconazole] Rash    Renal Failure    Family History  Problem Relation Age of Onset  . Stroke Father     died in his 55's  . Stroke Mother     died in her 70's  . Diabetes Father   . Prostate cancer Son   . Diabetes Son     #2  . Hypertension Son     #3  . Thyroid disease Neg Hx   . Heart attack Neg Hx     History   Social History  . Marital Status: Widowed    Spouse Name: N/A    Number of Children: 3  . Years of Education: N/A   Occupational History  . Retired    Social History Main Topics  . Smoking status: Never Smoker   . Smokeless tobacco: Never Used  . Alcohol Use: No  . Drug Use: No  . Sexual Activity: No   Other Topics Concern  . None   Social History Narrative   Widowed.  Lives with son in [place].             Review of Systems - See HPI.  All other ROS are negative.  BP 124/67 mmHg  Pulse 79  Temp(Src) 97.6 F (36.4 C) (Oral)  Resp 16  Ht '5\' 2"'  (1.575 m)  Wt 110 lb 8 oz (50.122 kg)  BMI 20.21 kg/m2  SpO2 100%  Physical Exam  Constitutional: She is well-developed, well-nourished, and in no distress.  HENT:  Head: Normocephalic and atraumatic.  Right Ear: External ear normal.  Left Ear: External ear normal.  Nose: Nose normal.  Mouth/Throat: Oropharynx is clear and moist. No oropharyngeal exudate.  TM within normal limits bilaterally.  Eyes: Conjunctivae are normal. Pupils are equal, round, and reactive to light.  Neck: Neck supple.  Cardiovascular: Normal rate, regular rhythm, normal heart sounds and intact  distal pulses.   Pulmonary/Chest: Effort normal and breath sounds normal. No respiratory distress. She has no wheezes. She has no rales. She exhibits no tenderness.  Neurological: She is alert. No cranial nerve deficit.  Oriented to person and place but not time.  Skin: Skin is warm and dry. No rash noted.  Psychiatric: Affect normal.  Vitals reviewed.   Recent Results (from  the past 2160 hour(s))  Cervicovaginal ancillary only     Status: None   Collection Time: 03/03/14 12:00 AM  Result Value Ref Range   Wet Prep (BD Affirm) Candida: Negative     Comment: Normal Reference Range - Negative   Wet Prep (BD Affirm) Gardnerella: Negative     Comment: Normal Reference Range - Negative   Wet Prep (BD Affirm) Trichomonas: Negative     Comment: Normal Reference Range - Negative  POCT urinalysis dipstick     Status: Abnormal   Collection Time: 03/03/14 11:52 AM  Result Value Ref Range   Color, UA gold    Clarity, UA cloudy    Glucose, UA neg    Bilirubin, UA neg    Ketones, UA neg    Spec Grav, UA 1.025    Blood, UA neg    pH, UA 5.0    Protein, UA positive    Urobilinogen, UA 0.2    Nitrite, UA neg    Leukocytes, UA large (3+)   Basic metabolic panel     Status: Abnormal   Collection Time: 03/05/14  3:55 PM  Result Value Ref Range   Sodium 139 135 - 145 mEq/L   Potassium 5.2 (H) 3.5 - 5.1 mEq/L   Chloride 103 96 - 112 mEq/L   CO2 26 19 - 32 mEq/L   Glucose, Bld 123 (H) 70 - 99 mg/dL   BUN 62 (H) 6 - 23 mg/dL   Creatinine, Ser 1.7 (H) 0.4 - 1.2 mg/dL   Calcium 9.4 8.4 - 10.5 mg/dL   GFR 31.76 (L) >60.00 mL/min  CULTURE, URINE COMPREHENSIVE     Status: None   Collection Time: 03/05/14  5:21 PM  Result Value Ref Range   Culture METHICILLIN RESISTANT STAPHYLOCOCCUS AUREUS    Colony Count 25,000 COLONIES/ML    Organism ID, Bacteria METHICILLIN RESISTANT STAPHYLOCOCCUS AUREUS     Comment: Rifampin and Gentamicin should not be used as single drugs for treatment of Staph  infections.       Susceptibility   Methicillin resistant staphylococcus aureus -  (no method available)    PENICILLIN >=0.5 Resistant     OXACILLIN >=4 Resistant     CEFAZOLIN  Resistant     GENTAMICIN <=0.5 Sensitive     CIPROFLOXACIN >=8 Resistant     LEVOFLOXACIN 4 Intermediate     NITROFURANTOIN <=16 Sensitive     TRIMETH/SULFA <=10 Sensitive     VANCOMYCIN <=0.5 Sensitive     LINEZOLID 2 Sensitive     RIFAMPIN <=0.5 Sensitive     TETRACYCLINE <=1 Sensitive   Implantable device check     Status: None   Collection Time: 03/05/14  8:02 PM  Result Value Ref Range   Date Time Interrogation Session [id number]    Pulse Generator Manufacturer Med[employer]onic    Pulse Gen Model [id number] Adapta    Pulse Gen Serial Number [id number]    RV Sense Sensitivity 5.6 mV   RA Pace Amplitude 2 V   RV Pace PulseWidth 0.4 ms   RV Pace Amplitude 2.5 V   RA Impedance 432 ohm   RA Amplitude 2 mV   RA Pacing Amplitude 0.75 V   RA Pacing PulseWidth 0.4 ms   RV IMPEDANCE 621 ohm   RV Amplitude 15.67 mV   RV Pacing Amplitude 0.5 V   RV Pacing PulseWidth 0.4 ms   Battery Status Unknown    Battery Longevity 120 mo   Battery Voltage 2.79 V  Battery Impedance 206 ohm   Brady AP VP Percent 3 %   Brady AS VP Percent 6 %   Brady AP VS Percent 65 %   Brady AS VS Percent 26 %   Eval Rhythm Brady @ 48    Miscellaneous Comment      Pacemaker check in clinic. Normal device function. Thresholds, sensing, impedances consistent with previous measurements. Device programmed to maximize longevity. 13 mode switches all < 1 minute.  No high ventricular rates noted. Device programmed at  appropriate safety margins. Histogram distribution appropriate for patient activity level. Device programmed to optimize intrinsic conduction. Estimated longevity 10 years. Patient enrolled in remote follow-up/TTM's with [employer]. Plan to follow every 3  months remotely and see annually in office. Patient education completed.   [facility] 06/04/14.   Comprehensive metabolic panel     Status: Abnormal   Collection Time: 03/10/14  9:28 PM  Result Value Ref Range   Sodium 135 135 - 145 mmol/L    Comment: Please note change in reference range.   Potassium 4.3 3.5 - 5.1 mmol/L    Comment: Please note change in reference range.   Chloride 99 96 - 112 mEq/L   CO2 23 19 - 32 mmol/L   Glucose, Bld 222 (H) 70 - 99 mg/dL   BUN 90 (H) 6 - 23 mg/dL   Creatinine, Ser 2.49 (H) 0.50 - 1.10 mg/dL   Calcium 8.7 8.4 - 10.5 mg/dL   Total Protein 7.3 6.0 - 8.3 g/dL   Albumin 3.9 3.5 - 5.2 g/dL   AST 22 0 - 37 U/L   ALT 9 0 - 35 U/L   Alkaline Phosphatase 96 39 - 117 U/L   Total Bilirubin 0.5 0.3 - 1.2 mg/dL   GFR calc non Af Amer 18 (L) >90 mL/min   GFR calc Af Amer 20 (L) >90 mL/min    Comment: (NOTE) The eGFR has been calculated using the CKD EPI equation. This calculation has not been validated in all clinical situations. eGFR's persistently <90 mL/min signify possible Chronic Kidney Disease.    Anion gap 13 5 - 15  CBC     Status: Abnormal   Collection Time: 03/10/14 10:08 PM  Result Value Ref Range   WBC 10.3 4.0 - 10.5 K/uL   RBC 3.47 (L) 3.87 - 5.11 MIL/uL   Hemoglobin 11.0 (L) 12.0 - 15.0 g/dL   HCT 33.3 (L) 36.0 - 46.0 %   MCV 96.0 78.0 - 100.0 fL   MCH 31.7 26.0 - 34.0 pg   MCHC 33.0 30.0 - 36.0 g/dL   RDW 13.6 11.5 - 15.5 %   Platelets 200 150 - 400 K/uL  Differential     Status: Abnormal   Collection Time: 03/10/14 10:08 PM  Result Value Ref Range   Neutrophils Relative % 63 43 - 77 %   Neutro Abs 6.5 1.7 - 7.7 K/uL   Lymphocytes Relative 16 12 - 46 %   Lymphs Abs 1.6 0.7 - 4.0 K/uL   Monocytes Relative 7 3 - 12 %   Monocytes Absolute 0.7 0.1 - 1.0 K/uL   Eosinophils Relative 14 (H) 0 - 5 %   Eosinophils Absolute 1.5 (H) 0.0 - 0.7 K/uL   Basophils Relative 0 0 - 1 %   Basophils Absolute 0.0 0.0 - 0.1 K/uL  Urinalysis, Routine w reflex microscopic     Status: Abnormal   Collection Time: 03/10/14  11:07 PM  Result Value Ref Range   Color,  Urine YELLOW YELLOW   APPearance CLEAR CLEAR   Specific Gravity, Urine 1.013 1.005 - 1.030   pH 5.0 5.0 - 8.0   Glucose, UA NEGATIVE NEGATIVE mg/dL   Hgb urine dipstick NEGATIVE NEGATIVE   Bilirubin Urine NEGATIVE NEGATIVE   Ketones, ur NEGATIVE NEGATIVE mg/dL   Protein, ur NEGATIVE NEGATIVE mg/dL   Urobilinogen, UA 0.2 0.0 - 1.0 mg/dL   Nitrite NEGATIVE NEGATIVE   Leukocytes, UA SMALL (A) NEGATIVE  Urine microscopic-add on     Status: Abnormal   Collection Time: 03/10/14 11:07 PM  Result Value Ref Range   Squamous Epithelial / LPF RARE RARE   WBC, UA 11-20 <3 WBC/hpf   RBC / HPF 0-2 <3 RBC/hpf   Bacteria, UA RARE RARE   Casts HYALINE CASTS (A) NEGATIVE  Urine culture     Status: None   Collection Time: 03/10/14 11:08 PM  Result Value Ref Range   Specimen Description URINE, CLEAN CATCH    Special Requests NONE    Culture  Setup Time      03/11/2014 08:25 Performed at [facility]      5,000 COLONIES/ML Performed at [facility]    Culture      INSIGNIFICANT GROWTH Performed at [facility]    Report Status 03/12/2014 FINAL   Comprehensive metabolic panel     Status: Abnormal   Collection Time: 03/11/14  5:30 AM  Result Value Ref Range   Sodium 135 135 - 145 mmol/L    Comment: Please note change in reference range.   Potassium 4.0 3.5 - 5.1 mmol/L    Comment: Please note change in reference range.   Chloride 101 96 - 112 mEq/L   CO2 18 (L) 19 - 32 mmol/L   Glucose, Bld 188 (H) 70 - 99 mg/dL   BUN 83 (H) 6 - 23 mg/dL   Creatinine, Ser 2.40 (H) 0.50 - 1.10 mg/dL   Calcium 8.4 8.4 - 10.5 mg/dL   Total Protein 6.4 6.0 - 8.3 g/dL   Albumin 3.4 (L) 3.5 - 5.2 g/dL   AST 22 0 - 37 U/L   ALT 12 0 - 35 U/L   Alkaline Phosphatase 91 39 - 117 U/L   Total Bilirubin 0.1 (L) 0.3 - 1.2 mg/dL   GFR calc non Af Amer 18 (L) >90 mL/min   GFR calc Af Amer 21 (L) >90 mL/min    Comment: (NOTE) The  eGFR has been calculated using the CKD EPI equation. This calculation has not been validated in all clinical situations. eGFR's persistently <90 mL/min signify possible Chronic Kidney Disease.    Anion gap 16 (H) 5 - 15  CBC with Differential     Status: Abnormal   Collection Time: 03/11/14  5:30 AM  Result Value Ref Range   WBC 9.6 4.0 - 10.5 K/uL   RBC 3.42 (L) 3.87 - 5.11 MIL/uL   Hemoglobin 10.5 (L) 12.0 - 15.0 g/dL   HCT 32.2 (L) 36.0 - 46.0 %   MCV 94.2 78.0 - 100.0 fL   MCH 30.7 26.0 - 34.0 pg   MCHC 32.6 30.0 - 36.0 g/dL   RDW 14.1 11.5 - 15.5 %   Platelets 210 150 - 400 K/uL   Neutrophils Relative % 86 (H) 43 - 77 %   Neutro Abs 8.3 (H) 1.7 - 7.7 K/uL   Lymphocytes Relative 10 (L) 12 - 46 %   Lymphs Abs 1.0 0.7 - 4.0  K/uL   Monocytes Relative 1 (L) 3 - 12 %   Monocytes Absolute 0.1 0.1 - 1.0 K/uL   Eosinophils Relative 3 0 - 5 %   Eosinophils Absolute 0.3 0.0 - 0.7 K/uL   Basophils Relative 0 0 - 1 %   Basophils Absolute 0.0 0.0 - 0.1 K/uL  Sedimentation rate     Status: Abnormal   Collection Time: 03/11/14  5:30 AM  Result Value Ref Range   Sed Rate 33 (H) 0 - 22 mm/hr  C-reactive protein     Status: Abnormal   Collection Time: 03/11/14  5:30 AM  Result Value Ref Range   CRP 1.4 (H) <0.60 mg/dL    Comment: Performed at [facility]  Glucose, capillary     Status: Abnormal   Collection Time: 03/11/14  8:07 AM  Result Value Ref Range   Glucose-Capillary 211 (H) 70 - 99 mg/dL  Glucose, capillary     Status: Abnormal   Collection Time: 03/11/14 12:04 PM  Result Value Ref Range   Glucose-Capillary 166 (H) 70 - 99 mg/dL  Salicylate level     Status: None   Collection Time: 03/11/14  2:03 PM  Result Value Ref Range   Salicylate Lvl <8.5 2.8 - 20.0 mg/dL  Glucose, capillary     Status: Abnormal   Collection Time: 03/11/14  4:43 PM  Result Value Ref Range   Glucose-Capillary 174 (H) 70 - 99 mg/dL  Glucose, capillary     Status: Abnormal   Collection  Time: 03/11/14  9:28 PM  Result Value Ref Range   Glucose-Capillary 166 (H) 70 - 99 mg/dL   Comment 1 Documented in Chart    Comment 2 Notify RN   Basic metabolic panel     Status: Abnormal   Collection Time: 03/12/14  6:00 AM  Result Value Ref Range   Sodium 140 135 - 145 mmol/L    Comment: Please note change in reference range.   Potassium 3.4 (L) 3.5 - 5.1 mmol/L    Comment: Please note change in reference range.   Chloride 110 96 - 112 mEq/L   CO2 18 (L) 19 - 32 mmol/L   Glucose, Bld 159 (H) 70 - 99 mg/dL   BUN 68 (H) 6 - 23 mg/dL   Creatinine, Ser 1.88 (H) 0.50 - 1.10 mg/dL   Calcium 8.6 8.4 - 10.5 mg/dL   GFR calc non Af Amer 25 (L) >90 mL/min   GFR calc Af Amer 29 (L) >90 mL/min    Comment: (NOTE) The eGFR has been calculated using the CKD EPI equation. This calculation has not been validated in all clinical situations. eGFR's persistently <90 mL/min signify possible Chronic Kidney Disease.    Anion gap 12 5 - 15  CBC     Status: Abnormal   Collection Time: 03/12/14  6:00 AM  Result Value Ref Range   WBC 12.2 (H) 4.0 - 10.5 K/uL   RBC 2.90 (L) 3.87 - 5.11 MIL/uL   Hemoglobin 9.2 (L) 12.0 - 15.0 g/dL   HCT 28.0 (L) 36.0 - 46.0 %   MCV 96.6 78.0 - 100.0 fL   MCH 31.7 26.0 - 34.0 pg   MCHC 32.9 30.0 - 36.0 g/dL   RDW 14.3 11.5 - 15.5 %   Platelets 194 150 - 400 K/uL  Glucose, capillary     Status: Abnormal   Collection Time: 03/12/14  8:33 AM  Result Value Ref Range   Glucose-Capillary 147 (H) 70 -  99 mg/dL  Glucose, capillary     Status: Abnormal   Collection Time: 03/12/14 12:48 PM  Result Value Ref Range   Glucose-Capillary 148 (H) 70 - 99 mg/dL  Glucose, capillary     Status: Abnormal   Collection Time: 03/12/14  5:48 PM  Result Value Ref Range   Glucose-Capillary 144 (H) 70 - 99 mg/dL  Glucose, capillary     Status: None   Collection Time: 03/12/14  9:38 PM  Result Value Ref Range   Glucose-Capillary 97 70 - 99 mg/dL  CBC     Status: Abnormal    Collection Time: 03/13/14  5:00 AM  Result Value Ref Range   WBC 14.1 (H) 4.0 - 10.5 K/uL   RBC 3.29 (L) 3.87 - 5.11 MIL/uL   Hemoglobin 10.0 (L) 12.0 - 15.0 g/dL   HCT 31.4 (L) 36.0 - 46.0 %   MCV 95.4 78.0 - 100.0 fL   MCH 30.4 26.0 - 34.0 pg   MCHC 31.8 30.0 - 36.0 g/dL   RDW 14.7 11.5 - 15.5 %   Platelets 218 150 - 400 K/uL  Basic metabolic panel     Status: Abnormal   Collection Time: 03/13/14  5:00 AM  Result Value Ref Range   Sodium 147 (H) 135 - 145 mmol/L    Comment: Please note change in reference range.   Potassium 3.8 3.5 - 5.1 mmol/L    Comment: Please note change in reference range.   Chloride 118 (H) 96 - 112 mEq/L   CO2 22 19 - 32 mmol/L   Glucose, Bld 51 (L) 70 - 99 mg/dL   BUN 52 (H) 6 - 23 mg/dL   Creatinine, Ser 1.55 (H) 0.50 - 1.10 mg/dL   Calcium 8.8 8.4 - 10.5 mg/dL   GFR calc non Af Amer 31 (L) >90 mL/min   GFR calc Af Amer 36 (L) >90 mL/min    Comment: (NOTE) The eGFR has been calculated using the CKD EPI equation. This calculation has not been validated in all clinical situations. eGFR's persistently <90 mL/min signify possible Chronic Kidney Disease.    Anion gap 7 5 - 15  Glucose, capillary     Status: Abnormal   Collection Time: 03/13/14  8:03 AM  Result Value Ref Range   Glucose-Capillary 48 (L) 70 - 99 mg/dL   Comment 1 Documented in Chart    Comment 2 Notify RN   Glucose, capillary     Status: Abnormal   Collection Time: 03/13/14 10:04 AM  Result Value Ref Range   Glucose-Capillary 154 (H) 70 - 99 mg/dL   Comment 1 Documented in Chart    Comment 2 Notify RN   Glucose, capillary     Status: Abnormal   Collection Time: 03/13/14  4:48 PM  Result Value Ref Range   Glucose-Capillary 145 (H) 70 - 99 mg/dL  Glucose, capillary     Status: None   Collection Time: 03/13/14  9:25 PM  Result Value Ref Range   Glucose-Capillary 74 70 - 99 mg/dL  Glucose, capillary     Status: Abnormal   Collection Time: 03/14/14  7:57 AM  Result Value Ref  Range   Glucose-Capillary 48 (L) 70 - 99 mg/dL   Comment 1 Notify RN   Glucose, capillary     Status: Abnormal   Collection Time: 03/14/14  8:33 AM  Result Value Ref Range   Glucose-Capillary 105 (H) 70 - 99 mg/dL  Glucose, capillary     Status: Abnormal  Collection Time: 03/14/14 11:50 AM  Result Value Ref Range   Glucose-Capillary 199 (H) 70 - 99 mg/dL  Basic Metabolic Panel (BMET)     Status: Abnormal   Collection Time: 03/23/14 11:11 AM  Result Value Ref Range   Sodium 140 135 - 145 mEq/L   Potassium 4.6 3.5 - 5.1 mEq/L   Chloride 101 96 - 112 mEq/L   CO2 28 19 - 32 mEq/L   Glucose, Bld 285 (H) 70 - 99 mg/dL   BUN 44 (H) 6 - 23 mg/dL   Creatinine, Ser 1.9 (H) 0.4 - 1.2 mg/dL   Calcium 8.8 8.4 - 10.5 mg/dL   GFR 27.01 (L) >60.00 mL/min  CULTURE, URINE COMPREHENSIVE     Status: None   Collection Time: 03/24/14  3:01 PM  Result Value Ref Range   Colony Count NO GROWTH    Organism ID, Bacteria NO GROWTH   Basic Metabolic Panel (BMET)     Status: Abnormal   Collection Time: 03/30/14  2:22 PM  Result Value Ref Range   Sodium 134 (L) 135 - 145 mEq/L   Potassium 4.6 3.5 - 5.1 mEq/L   Chloride 99 96 - 112 mEq/L   CO2 29 19 - 32 mEq/L   Glucose, Bld 215 (H) 70 - 99 mg/dL   BUN 37 (H) 6 - 23 mg/dL   Creatinine, Ser 1.3 (H) 0.4 - 1.2 mg/dL   Calcium 9.1 8.4 - 10.5 mg/dL   GFR 43.66 (L) >60.00 mL/min    Assessment/Plan: Generalized maculopapular rash Resolved.  No recurrence of symptoms. Keflex added to allergy list.   Acute renal failure Cr Improved back to baseline renal function.  Endorses good urinary output.  No evidence of SOB or peripheral edema.  Will hold Lasix and only use if increase in weight by > 3 lbs in one day or 5 pounds in 4 days.  Follow-up in 2 weeks for repeat assessment and weight.   Acute confusional state Resolved but daughter notes patient's cognition is not back to baseline.  Reassurance given.  Urine dip unremarkable.  Will send for  culture.

## 2014-03-24 NOTE — Telephone Encounter (Signed)
DPR on file to s/w son [name]. [name] has been notified about lab results and to have pt hold lasix x 1 day, monitor weight, bmet 1/11. Son verbalized understanding to Plan of Care.

## 2014-03-25 ENCOUNTER — Telehealth: Payer: Self-pay

## 2014-03-25 DIAGNOSIS — R451 Restlessness and agitation: Secondary | ICD-10-CM

## 2014-03-25 NOTE — Telephone Encounter (Signed)
Caller: Patty from [place] to report 75000 gram negative rods (preliminary results) in urine over the weekend.  States she will fax results over to the office.  Please advise.

## 2014-03-25 NOTE — Telephone Encounter (Signed)
Have we received the results for sensitivities?  I have not received any of the paperwork from [facility].  Patient recently hospitalized with acute renal failure so I want these sensitivities before antibiotic therapy is initiated.

## 2014-03-26 LAB — CULTURE, URINE COMPREHENSIVE
COLONY COUNT: NO GROWTH
ORGANISM ID, BACTERIA: NO GROWTH

## 2014-03-26 NOTE — Telephone Encounter (Signed)
Barbara Garner states that she has not noticed any classic symptoms of a UTI.  However, she says she has noticed fluctuations in her blood sugars. In the mornings her blood sugars before breakfast "are like 76, 78 and then before lunch are like 315."  She also continues to exhibit mental status changes.  Otherwise, no other complaints.

## 2014-03-26 NOTE — Telephone Encounter (Addendum)
Urine culture obtained in our office is negative for infection.  Specimen at home could have been a bad catch. Please call daughter-in-law to assess for any UTI symptoms.  The only symptom at time of visit was some nighttime confusion possibly due to dementia but could represent UTI as well. Giving recent renal failure, I want to see if the patient is having any classic UTI symptoms or continued mental decline before we make any decisions.

## 2014-03-26 NOTE — Telephone Encounter (Signed)
Barbara Garner from [facility] states that she did not get a follow up call regarding these results. [facility] # [phone]

## 2014-03-27 MED ORDER — CLONAZEPAM 0.5 MG PO TABS: 0.5000 mg | ORAL_TABLET | Freq: Every day | ORAL | Status: DC

## 2014-03-27 NOTE — Addendum Note (Signed)
Addended by: Raiford Noble on: 03/27/2014 03:03 PM   Modules accepted: Orders

## 2014-03-27 NOTE — Telephone Encounter (Signed)
Called Patti with [facility].  No answer.  Left message for call back.

## 2014-03-27 NOTE — Telephone Encounter (Signed)
Spoke with [employer].  Barbara Garner stated that verbal report from lab today indicated no growth which is consistent with the results of urine culture collected here in office. She is aware that Einar Pheasant does not want to start antibiotic therapy at this time.

## 2014-03-27 NOTE — Telephone Encounter (Signed)
LMOM with contact name and number RE: Authorization given for MSW per VO by provider, also, updated her on information forwarded to family/SLS

## 2014-03-27 NOTE — Telephone Encounter (Signed)
I am unsure of where the initial abnormality on urine from their specimen came from as the report sent over and urine culture both there and here are completely normal.   Again, please reiterate to patient's daughter that her workup is negative for source of confusion.  Giving recent hospital admission, I really feel that decline in mental status is through significant stress on mind and body from medical issues.  Continue to monitor.  We can set up with Neurology if daughter is willing.

## 2014-03-27 NOTE — Telephone Encounter (Signed)
Please try to contact [facility] nurse with this if you get the chance today.  I will keep a look out for her call.

## 2014-03-27 NOTE — Telephone Encounter (Signed)
Patti from advanced calling back regarding this. [phone]

## 2014-03-27 NOTE — Telephone Encounter (Signed)
Daughter states that they really need some help with her.  She gets really confused and it's hard to calm her down. Daughter states that on occasion patient does become combative.  Confusion seems to increase with lack of rest.    Please advise.

## 2014-03-27 NOTE — Telephone Encounter (Signed)
I have written Rx for Klonopin to use at bedtime to help agitation and promote restful sleep.  Will fax to pharmacy.   Again symptoms are an acute worsening of dementia due to all the recent medical issues.  I will place order for MSW.  Verbal order is given.  Please call Patti.    Again they are to follow-up Monday.  If anything acutely worsens, they are to call 911.

## 2014-03-27 NOTE — Telephone Encounter (Signed)
Called Pattie with [facility] this morning.  LMOM for call back.  Again culture here negative. With recent hospitalization for acute renal failure, will hold off on antibiotic therapy until results and sensitivities can be reviewed.

## 2014-03-27 NOTE — Telephone Encounter (Signed)
Spoke with caller, Donavan Burnet, informed of provider instructions; understood & agreed/SLS

## 2014-03-27 NOTE — Telephone Encounter (Signed)
Barbara Garner with [facility] called to report that caregivers mentioned to her that patient's confusion and dementia is worsening.  She's not sleeping as much.  The family is considering assisted living.  Precious Bard is requesting a verbal order for social work.  Please advise.

## 2014-03-30 ENCOUNTER — Telehealth: Payer: Self-pay | Admitting: Physician Assistant

## 2014-03-30 ENCOUNTER — Other Ambulatory Visit (INDEPENDENT_AMBULATORY_CARE_PROVIDER_SITE_OTHER): Payer: Medicare Other | Admitting: *Deleted

## 2014-03-30 DIAGNOSIS — I10 Essential (primary) hypertension: Secondary | ICD-10-CM

## 2014-03-30 LAB — BASIC METABOLIC PANEL
BUN: 37 mg/dL — AB (ref 6–23)
CALCIUM: 9.1 mg/dL (ref 8.4–10.5)
CO2: 29 meq/L (ref 19–32)
Chloride: 99 mEq/L (ref 96–112)
Creatinine, Ser: 1.3 mg/dL — ABNORMAL HIGH (ref 0.4–1.2)
GFR: 43.66 mL/min — ABNORMAL LOW (ref >60.00)
GLUCOSE: 215 mg/dL — AB (ref 70–99)
POTASSIUM: 4.6 meq/L (ref 3.5–5.1)
Sodium: 134 mEq/L — ABNORMAL LOW (ref 135–145)

## 2014-03-30 NOTE — Telephone Encounter (Signed)
Caller name: Shaia Tassone Relation to pt: daughter Call back number: [phone]   Reason for call:  Fasting blood sugar results for the week; 6 days in the 70s and today was 119. Shooting pain thru foot coming and going in need of clinical advice.

## 2014-03-31 ENCOUNTER — Telehealth: Payer: Self-pay

## 2014-03-31 NOTE — Telephone Encounter (Signed)
Shared results BMP with pts son [name]. Inquired about kidney function as well, educated that though slightly elevated the function has improved from 2 weeks ago. Agree to continue on current treatment plan and would share with pt.

## 2014-04-01 NOTE — Telephone Encounter (Signed)
Fasting blood sugar looks good.  For the pain it's hard for me to say without seeing it or more information. Please call to triage patient.

## 2014-04-02 NOTE — Telephone Encounter (Signed)
Left a message for call back.  

## 2014-04-02 NOTE — Telephone Encounter (Signed)
Patient daughter returned phone call. Best # [phone]

## 2014-04-02 NOTE — Telephone Encounter (Signed)
Spoke with daughter, Helene Kelp. She stated that pain went away.  Blood sugars are still stable.  No needs at this time.

## 2014-04-05 DIAGNOSIS — F05 Delirium due to known physiological condition: Secondary | ICD-10-CM | POA: Insufficient documentation

## 2014-04-05 NOTE — Assessment & Plan Note (Signed)
Resolved but daughter notes patient's cognition is not back to baseline.  Reassurance given.  Urine dip unremarkable.  Will send for culture.

## 2014-04-05 NOTE — Assessment & Plan Note (Signed)
Resolved.  No recurrence of symptoms. Keflex added to allergy list.

## 2014-04-05 NOTE — Assessment & Plan Note (Signed)
Cr Improved back to baseline renal function.  Endorses good urinary output.  No evidence of SOB or peripheral edema.  Will hold Lasix and only use if increase in weight by > 3 lbs in one day or 5 pounds in 4 days.  Follow-up in 2 weeks for repeat assessment and weight.

## 2014-04-06 ENCOUNTER — Telehealth: Payer: Self-pay | Admitting: *Deleted

## 2014-04-06 DIAGNOSIS — F0390 Unspecified dementia without behavioral disturbance: Secondary | ICD-10-CM

## 2014-04-06 DIAGNOSIS — I129 Hypertensive chronic kidney disease with stage 1 through stage 4 chronic kidney disease, or unspecified chronic kidney disease: Secondary | ICD-10-CM

## 2014-04-06 DIAGNOSIS — L080 Pyoderma: Secondary | ICD-10-CM

## 2014-04-06 DIAGNOSIS — E119 Type 2 diabetes mellitus without complications: Secondary | ICD-10-CM

## 2014-04-06 NOTE — Telephone Encounter (Signed)
Received home health certification and plan of care via fax from [place]. Forwarded to Dr. Charlett Blake. JG//CMA

## 2014-04-07 ENCOUNTER — Encounter: Payer: Self-pay | Admitting: Physician Assistant

## 2014-04-07 ENCOUNTER — Ambulatory Visit: Payer: Medicare Other | Admitting: Physician Assistant

## 2014-04-07 ENCOUNTER — Ambulatory Visit (INDEPENDENT_AMBULATORY_CARE_PROVIDER_SITE_OTHER): Payer: Medicare Other | Admitting: Physician Assistant

## 2014-04-07 VITALS — BP 116/70 | HR 82 | Temp 97.5°F | Resp 14 | Ht 62.0 in | Wt 108.5 lb

## 2014-04-07 DIAGNOSIS — F05 Delirium due to known physiological condition: Secondary | ICD-10-CM

## 2014-04-07 DIAGNOSIS — E119 Type 2 diabetes mellitus without complications: Secondary | ICD-10-CM

## 2014-04-07 LAB — BASIC METABOLIC PANEL
BUN: 56 mg/dL — ABNORMAL HIGH (ref 6–23)
CALCIUM: 9 mg/dL (ref 8.4–10.5)
CO2: 31 mEq/L (ref 19–32)
CREATININE: 1.38 mg/dL — AB (ref 0.40–1.20)
Chloride: 96 mEq/L (ref 96–112)
GFR: 39.3 mL/min — ABNORMAL LOW (ref >60.00)
Glucose, Bld: 188 mg/dL — ABNORMAL HIGH (ref 70–99)
Potassium: 4.2 mEq/L (ref 3.5–5.1)
Sodium: 133 mEq/L — ABNORMAL LOW (ref 135–145)

## 2014-04-07 LAB — HEMOGLOBIN A1C: HEMOGLOBIN A1C: 7.7 % — AB (ref 4.6–6.5)

## 2014-04-07 NOTE — Telephone Encounter (Signed)
Signed paperwork faxed to [place] at [phone] successfully. JG//CMA

## 2014-04-07 NOTE — Patient Instructions (Signed)
Please take medications as directed.  Stop by the lab for blood work.  Please check glucose as follows:    Before breakfast, 1 Hour after lunch, Before dinner, bedtime.   Write down these recordings.  Call me with these results or drop them by the office.  Follow-up in 1 month.

## 2014-04-07 NOTE — Progress Notes (Signed)
Pre visit review using our clinic review tool, if applicable. No additional management support is needed unless otherwise documented below in the visit note/SLS  

## 2014-04-10 ENCOUNTER — Telehealth: Payer: Self-pay | Admitting: Physician Assistant

## 2014-04-10 NOTE — Telephone Encounter (Signed)
BUN is significantly elevated and Creatinine has improved.  This combination is likely due to decreased perfusion to her kidneys -- through either dehydration and worsened by her history of CHF.  Make sure she is getting enough to drink throughout the day, but nothing too excessive -- Make sure she is drinking with meals.  I want to her to be seen early this week to reassess her fluid and kidney status.  How is her urinary output? Any leg swelling or SOB? Please see who is willing to see her early this week.

## 2014-04-12 DIAGNOSIS — E119 Type 2 diabetes mellitus without complications: Secondary | ICD-10-CM | POA: Insufficient documentation

## 2014-04-12 NOTE — Assessment & Plan Note (Signed)
Resolved.  Back to baseline impairment.  Encouraged family to keep Klonopin on hand in case of sundowning and agitation.  Discussed memory aids but family declines at present.  Will continue to monitor.

## 2014-04-12 NOTE — Progress Notes (Signed)
Patient presents to clinic today for follow-up of residual mental confusion and agitation after her episode of encephalopathy in the hospital.  Patient was given Klonopin for bedtime use to help with agitation.  Daughter states that patient's agitation has resolved on its own.  Has also noted that she is more "like herself".  Is being more active and telling jokes.  Memory still at baseline impairment per daughter. Denies recurrence of maculopapular rash.  Has been using diaper cream on patient to prevent irritation to perianal and GU region. Daughter has noted that patient's sugar has been slightly more elevated than usual.  Endorses post-prandial glucose around 190-200 whereas previously well controlled.  Daughter is trying to help make diet more consistent.  Past Medical History  Diagnosis Date  . Iron deficiency anemia, unspecified   . Chronic diastolic CHF (congestive heart failure)   . CAD  S/P CABG x 3 08/2011     LIMA to diagonal branch, SVG to OM1, SVG to PDA, EVH via bilateral thighs  . Type II or unspecified type diabetes mellitus without mention of complication, not stated as uncontrolled   . Contact dermatitis and other eczema, due to unspecified cause   . Esophageal reflux   . Headache(784.0)   . Pacemaker MDT dual chamber     DOI 08/2011  . Pure hypercholesterolemia   . hypertension   . Hypothyroidism   . Atrioventricular block, complete -intermittent   . Memory loss   . Idiopathic peripheral neuropathy   . LBBB (left bundle branch block)   . Carotid stenosis     Carotid dopplers 0/34 with 91% LICA stenosis  . Severe aortic stenosis -S/P AVR      19 mm [facility] Ease pericardial tissue valve  . Extrinsic asthma, unspecified     hasn't used inhaler in past year  . CKD (chronic kidney disease) stage 4, GFR 15-29 ml/min     Cr 1.6 in 6/11, sees Dr. Arty Baumgartner  . Impaired vision     pt. reports that she identifies her meds by looking at the pillls, she is not able to  read labels on bottles    Current Outpatient Prescriptions on File Prior to Visit  Medication Sig Dispense Refill  . albuterol (PROVENTIL HFA;VENTOLIN HFA) 108 (90 BASE) MCG/ACT inhaler Inhale 2 puffs into the lungs every 6 (six) hours as needed for wheezing or shortness of breath. 1 Inhaler 0  . ammonium lactate (AMLACTIN) 12 % lotion Apply 1 application topically as needed for dry skin. 225 g 0  . aspirin 81 MG EC tablet Take 1 tablet (81 mg total) by mouth daily. 30 tablet 3  . atorvastatin (LIPITOR) 40 MG tablet Take 40 mg by mouth daily.    . Blood Glucose Monitoring Suppl (ONE TOUCH ULTRA MINI) W/DEVICE KIT USE AS DIRECTED TO TEST BLOOD GLUCOSE ONCE DAILY DX: 250.00 1 each 0  . carvedilol (COREG) 25 MG tablet Take 12.5 mg by mouth 2 (two) times daily after a meal.    . Cholecalciferol (VITAMIN D-3) 1000 UNITS CAPS Take 1 capsule by mouth daily.    . clonazePAM (KLONOPIN) 0.5 MG tablet Take 1 tablet (0.5 mg total) by mouth at bedtime. 20 tablet 1  . diphenhydrAMINE (BENADRYL) 25 mg capsule Take 1 capsule (25 mg total) by mouth every 6 (six) hours as needed for itching or allergies. 60 capsule 0  . fluticasone (FLONASE) 50 MCG/ACT nasal spray Place 2 sprays into both nostrils daily. 16 g 0  .  furosemide (LASIX) 40 MG tablet Take 0.5 tablets (20 mg total) by mouth daily. 90 tablet 1  . glipiZIDE (GLUCOTROL XL) 10 MG 24 hr tablet TAKE ONE TABLET BY MOUTH IN THE MORNING 30 tablet 2  . glucose blood test strip Use as instructed to test blood glucose once daily Dx: E11.9 100 each 12  . HYDROcodone-acetaminophen (NORCO/VICODIN) 5-325 MG per tablet Take 1 tablet by mouth every 6 (six) hours as needed for moderate pain. BACK PAIN    . hydrocortisone cream 1 % Apply topically 2 (two) times daily. Apply to rash BID until rash cleared. Avoid face and eye area. 30 g 3  . levothyroxine (SYNTHROID, LEVOTHROID) 50 MCG tablet Take 1 tablet (50 mcg total) by mouth daily before breakfast. 90 tablet 1  .  nystatin ointment (MYCOSTATIN) Apply 1 application topically 2 (two) times daily. 30 g 1   No current facility-administered medications on file prior to visit.    Allergies  Allergen Reactions  . Keflex [Cephalexin]     rash  . Metformin     REACTION: intolerant  . Promethazine Hcl     REACTION: u/k  . Diflucan [Fluconazole] Rash    Renal Failure    Family History  Problem Relation Age of Onset  . Stroke Father     died in his 66's  . Stroke Mother     died in her 57's  . Diabetes Father   . Prostate cancer Son   . Diabetes Son     #2  . Hypertension Son     #3  . Thyroid disease Neg Hx   . Heart attack Neg Hx     History   Social History  . Marital Status: Widowed    Spouse Name: N/A    Number of Children: 3  . Years of Education: N/A   Occupational History  . Retired    Social History Main Topics  . Smoking status: Never Smoker   . Smokeless tobacco: Never Used  . Alcohol Use: No  . Drug Use: No  . Sexual Activity: No   Other Topics Concern  . None   Social History Narrative   Widowed.  Lives with son in [place].            Review of Systems - See HPI.  All other ROS are negative.  BP 116/70 mmHg  Pulse 82  Temp(Src) 97.5 F (36.4 C) (Oral)  Resp 14  Ht '5\' 2"'  (1.575 m)  Wt 108 lb 8 oz (49.215 kg)  BMI 19.84 kg/m2  SpO2 98%  Physical Exam  Constitutional: She is well-developed, well-nourished, and in no distress.  HENT:  Head: Normocephalic and atraumatic.  Right Ear: External ear normal.  Left Ear: External ear normal.  Nose: Nose normal.  Mouth/Throat: Oropharynx is clear and moist.  Eyes: Conjunctivae are normal. Pupils are equal, round, and reactive to light.  Neck: Neck supple.  Cardiovascular: Normal rate, regular rhythm, normal heart sounds and intact distal pulses.   Pulmonary/Chest: Effort normal and breath sounds normal. No respiratory distress. She has no wheezes. She has no rales. She exhibits no tenderness.    Neurological: No cranial nerve deficit.  Alert and oriented to person and place but not time.  Skin: Skin is warm and dry. No rash noted.  Vitals reviewed.   Recent Results (from the past 2160 hour(s))  Cervicovaginal ancillary only     Status: None   Collection Time: 03/03/14 12:00 AM  Result Value Ref  Range   Wet Prep (BD Affirm) Candida: Negative     Comment: Normal Reference Range - Negative   Wet Prep (BD Affirm) Gardnerella: Negative     Comment: Normal Reference Range - Negative   Wet Prep (BD Affirm) Trichomonas: Negative     Comment: Normal Reference Range - Negative  POCT urinalysis dipstick     Status: Abnormal   Collection Time: 03/03/14 11:52 AM  Result Value Ref Range   Color, UA gold    Clarity, UA cloudy    Glucose, UA neg    Bilirubin, UA neg    Ketones, UA neg    Spec Grav, UA 1.025    Blood, UA neg    pH, UA 5.0    Protein, UA positive    Urobilinogen, UA 0.2    Nitrite, UA neg    Leukocytes, UA large (3+)   Basic metabolic panel     Status: Abnormal   Collection Time: 03/05/14  3:55 PM  Result Value Ref Range   Sodium 139 135 - 145 mEq/L   Potassium 5.2 (H) 3.5 - 5.1 mEq/L   Chloride 103 96 - 112 mEq/L   CO2 26 19 - 32 mEq/L   Glucose, Bld 123 (H) 70 - 99 mg/dL   BUN 62 (H) 6 - 23 mg/dL   Creatinine, Ser 1.7 (H) 0.4 - 1.2 mg/dL   Calcium 9.4 8.4 - 10.5 mg/dL   GFR 31.76 (L) >60.00 mL/min  CULTURE, URINE COMPREHENSIVE     Status: None   Collection Time: 03/05/14  5:21 PM  Result Value Ref Range   Culture METHICILLIN RESISTANT STAPHYLOCOCCUS AUREUS    Colony Count 25,000 COLONIES/ML    Organism ID, Bacteria METHICILLIN RESISTANT STAPHYLOCOCCUS AUREUS     Comment: Rifampin and Gentamicin should not be used as single drugs for treatment of Staph infections.       Susceptibility   Methicillin resistant staphylococcus aureus -  (no method available)    PENICILLIN >=0.5 Resistant     OXACILLIN >=4 Resistant     CEFAZOLIN  Resistant      GENTAMICIN <=0.5 Sensitive     CIPROFLOXACIN >=8 Resistant     LEVOFLOXACIN 4 Intermediate     NITROFURANTOIN <=16 Sensitive     TRIMETH/SULFA <=10 Sensitive     VANCOMYCIN <=0.5 Sensitive     LINEZOLID 2 Sensitive     RIFAMPIN <=0.5 Sensitive     TETRACYCLINE <=1 Sensitive   Implantable device check     Status: None   Collection Time: 03/05/14  8:02 PM  Result Value Ref Range   Date Time Interrogation Session [id number]    Pulse Generator Manufacturer Medtronic    Pulse Gen Model [id number] Adapta    Pulse Gen Serial Number [id number]    RV Sense Sensitivity 5.6 mV   RA Pace Amplitude 2 V   RV Pace PulseWidth 0.4 ms   RV Pace Amplitude 2.5 V   RA Impedance 432 ohm   RA Amplitude 2 mV   RA Pacing Amplitude 0.75 V   RA Pacing PulseWidth 0.4 ms   RV IMPEDANCE 621 ohm   RV Amplitude 15.67 mV   RV Pacing Amplitude 0.5 V   RV Pacing PulseWidth 0.4 ms   Battery Status Unknown    Battery Longevity 120 mo   Battery Voltage 2.79 V   Battery Impedance 206 ohm   Brady AP VP Percent 3 %   Brady AS VP Percent 6 %   Brady AP VS  Percent 65 %   Brady AS VS Percent 26 %   Eval Rhythm Brady @ 48    Miscellaneous Comment      Pacemaker check in clinic. Normal device function. Thresholds, sensing, impedances consistent with previous measurements. Device programmed to maximize longevity. 13 mode switches all < 1 minute.  No high ventricular rates noted. Device programmed at  appropriate safety margins. Histogram distribution appropriate for patient activity level. Device programmed to optimize intrinsic conduction. Estimated longevity 10 years. Patient enrolled in remote follow-up/TTM's with [employer]. Plan to follow every 3  months remotely and see annually in office. Patient education completed.  [facility] 06/04/14.   Comprehensive metabolic panel     Status: Abnormal   Collection Time: 03/10/14  9:28 PM  Result Value Ref Range   Sodium 135 135 - 145 mmol/L    Comment: Please note change in  reference range.   Potassium 4.3 3.5 - 5.1 mmol/L    Comment: Please note change in reference range.   Chloride 99 96 - 112 mEq/L   CO2 23 19 - 32 mmol/L   Glucose, Bld 222 (H) 70 - 99 mg/dL   BUN 90 (H) 6 - 23 mg/dL   Creatinine, Ser 2.49 (H) 0.50 - 1.10 mg/dL   Calcium 8.7 8.4 - 10.5 mg/dL   Total Protein 7.3 6.0 - 8.3 g/dL   Albumin 3.9 3.5 - 5.2 g/dL   AST 22 0 - 37 U/L   ALT 9 0 - 35 U/L   Alkaline Phosphatase 96 39 - 117 U/L   Total Bilirubin 0.5 0.3 - 1.2 mg/dL   GFR calc non Af Amer 18 (L) >90 mL/min   GFR calc Af Amer 20 (L) >90 mL/min    Comment: (NOTE) The eGFR has been calculated using the CKD EPI equation. This calculation has not been validated in all clinical situations. eGFR's persistently <90 mL/min signify possible Chronic Kidney Disease.    Anion gap 13 5 - 15  CBC     Status: Abnormal   Collection Time: 03/10/14 10:08 PM  Result Value Ref Range   WBC 10.3 4.0 - 10.5 K/uL   RBC 3.47 (L) 3.87 - 5.11 MIL/uL   Hemoglobin 11.0 (L) 12.0 - 15.0 g/dL   HCT 33.3 (L) 36.0 - 46.0 %   MCV 96.0 78.0 - 100.0 fL   MCH 31.7 26.0 - 34.0 pg   MCHC 33.0 30.0 - 36.0 g/dL   RDW 13.6 11.5 - 15.5 %   Platelets 200 150 - 400 K/uL  Differential     Status: Abnormal   Collection Time: 03/10/14 10:08 PM  Result Value Ref Range   Neutrophils Relative % 63 43 - 77 %   Neutro Abs 6.5 1.7 - 7.7 K/uL   Lymphocytes Relative 16 12 - 46 %   Lymphs Abs 1.6 0.7 - 4.0 K/uL   Monocytes Relative 7 3 - 12 %   Monocytes Absolute 0.7 0.1 - 1.0 K/uL   Eosinophils Relative 14 (H) 0 - 5 %   Eosinophils Absolute 1.5 (H) 0.0 - 0.7 K/uL   Basophils Relative 0 0 - 1 %   Basophils Absolute 0.0 0.0 - 0.1 K/uL  Urinalysis, Routine w reflex microscopic     Status: Abnormal   Collection Time: 03/10/14 11:07 PM  Result Value Ref Range   Color, Urine YELLOW YELLOW   APPearance CLEAR CLEAR   Specific Gravity, Urine 1.013 1.005 - 1.030   pH 5.0 5.0 - 8.0  Glucose, UA NEGATIVE NEGATIVE mg/dL   Hgb  urine dipstick NEGATIVE NEGATIVE   Bilirubin Urine NEGATIVE NEGATIVE   Ketones, ur NEGATIVE NEGATIVE mg/dL   Protein, ur NEGATIVE NEGATIVE mg/dL   Urobilinogen, UA 0.2 0.0 - 1.0 mg/dL   Nitrite NEGATIVE NEGATIVE   Leukocytes, UA SMALL (A) NEGATIVE  Urine microscopic-add on     Status: Abnormal   Collection Time: 03/10/14 11:07 PM  Result Value Ref Range   Squamous Epithelial / LPF RARE RARE   WBC, UA 11-20 <3 WBC/hpf   RBC / HPF 0-2 <3 RBC/hpf   Bacteria, UA RARE RARE   Casts HYALINE CASTS (A) NEGATIVE  Urine culture     Status: None   Collection Time: 03/10/14 11:08 PM  Result Value Ref Range   Specimen Description URINE, CLEAN CATCH    Special Requests NONE    Culture  Setup Time      03/11/2014 08:25 Performed at [facility]      5,000 COLONIES/ML Performed at [facility]    Culture      INSIGNIFICANT GROWTH Performed at [facility]    Report Status 03/12/2014 FINAL   Comprehensive metabolic panel     Status: Abnormal   Collection Time: 03/11/14  5:30 AM  Result Value Ref Range   Sodium 135 135 - 145 mmol/L    Comment: Please note change in reference range.   Potassium 4.0 3.5 - 5.1 mmol/L    Comment: Please note change in reference range.   Chloride 101 96 - 112 mEq/L   CO2 18 (L) 19 - 32 mmol/L   Glucose, Bld 188 (H) 70 - 99 mg/dL   BUN 83 (H) 6 - 23 mg/dL   Creatinine, Ser 2.40 (H) 0.50 - 1.10 mg/dL   Calcium 8.4 8.4 - 10.5 mg/dL   Total Protein 6.4 6.0 - 8.3 g/dL   Albumin 3.4 (L) 3.5 - 5.2 g/dL   AST 22 0 - 37 U/L   ALT 12 0 - 35 U/L   Alkaline Phosphatase 91 39 - 117 U/L   Total Bilirubin 0.1 (L) 0.3 - 1.2 mg/dL   GFR calc non Af Amer 18 (L) >90 mL/min   GFR calc Af Amer 21 (L) >90 mL/min    Comment: (NOTE) The eGFR has been calculated using the CKD EPI equation. This calculation has not been validated in all clinical situations. eGFR's persistently <90 mL/min signify possible Chronic Kidney Disease.     Anion gap 16 (H) 5 - 15  CBC with Differential     Status: Abnormal   Collection Time: 03/11/14  5:30 AM  Result Value Ref Range   WBC 9.6 4.0 - 10.5 K/uL   RBC 3.42 (L) 3.87 - 5.11 MIL/uL   Hemoglobin 10.5 (L) 12.0 - 15.0 g/dL   HCT 32.2 (L) 36.0 - 46.0 %   MCV 94.2 78.0 - 100.0 fL   MCH 30.7 26.0 - 34.0 pg   MCHC 32.6 30.0 - 36.0 g/dL   RDW 14.1 11.5 - 15.5 %   Platelets 210 150 - 400 K/uL   Neutrophils Relative % 86 (H) 43 - 77 %   Neutro Abs 8.3 (H) 1.7 - 7.7 K/uL   Lymphocytes Relative 10 (L) 12 - 46 %   Lymphs Abs 1.0 0.7 - 4.0 K/uL   Monocytes Relative 1 (L) 3 - 12 %   Monocytes Absolute 0.1 0.1 - 1.0 K/uL   Eosinophils Relative 3  0 - 5 %   Eosinophils Absolute 0.3 0.0 - 0.7 K/uL   Basophils Relative 0 0 - 1 %   Basophils Absolute 0.0 0.0 - 0.1 K/uL  Sedimentation rate     Status: Abnormal   Collection Time: 03/11/14  5:30 AM  Result Value Ref Range   Sed Rate 33 (H) 0 - 22 mm/hr  C-reactive protein     Status: Abnormal   Collection Time: 03/11/14  5:30 AM  Result Value Ref Range   CRP 1.4 (H) <0.60 mg/dL    Comment: Performed at [facility]  Glucose, capillary     Status: Abnormal   Collection Time: 03/11/14  8:07 AM  Result Value Ref Range   Glucose-Capillary 211 (H) 70 - 99 mg/dL  Glucose, capillary     Status: Abnormal   Collection Time: 03/11/14 12:04 PM  Result Value Ref Range   Glucose-Capillary 166 (H) 70 - 99 mg/dL  Salicylate level     Status: None   Collection Time: 03/11/14  2:03 PM  Result Value Ref Range   Salicylate Lvl <9.5 2.8 - 20.0 mg/dL  Glucose, capillary     Status: Abnormal   Collection Time: 03/11/14  4:43 PM  Result Value Ref Range   Glucose-Capillary 174 (H) 70 - 99 mg/dL  Glucose, capillary     Status: Abnormal   Collection Time: 03/11/14  9:28 PM  Result Value Ref Range   Glucose-Capillary 166 (H) 70 - 99 mg/dL   Comment 1 Documented in Chart    Comment 2 Notify RN   Basic metabolic panel     Status: Abnormal    Collection Time: 03/12/14  6:00 AM  Result Value Ref Range   Sodium 140 135 - 145 mmol/L    Comment: Please note change in reference range.   Potassium 3.4 (L) 3.5 - 5.1 mmol/L    Comment: Please note change in reference range.   Chloride 110 96 - 112 mEq/L   CO2 18 (L) 19 - 32 mmol/L   Glucose, Bld 159 (H) 70 - 99 mg/dL   BUN 68 (H) 6 - 23 mg/dL   Creatinine, Ser 1.88 (H) 0.50 - 1.10 mg/dL   Calcium 8.6 8.4 - 10.5 mg/dL   GFR calc non Af Amer 25 (L) >90 mL/min   GFR calc Af Amer 29 (L) >90 mL/min    Comment: (NOTE) The eGFR has been calculated using the CKD EPI equation. This calculation has not been validated in all clinical situations. eGFR's persistently <90 mL/min signify possible Chronic Kidney Disease.    Anion gap 12 5 - 15  CBC     Status: Abnormal   Collection Time: 03/12/14  6:00 AM  Result Value Ref Range   WBC 12.2 (H) 4.0 - 10.5 K/uL   RBC 2.90 (L) 3.87 - 5.11 MIL/uL   Hemoglobin 9.2 (L) 12.0 - 15.0 g/dL   HCT 28.0 (L) 36.0 - 46.0 %   MCV 96.6 78.0 - 100.0 fL   MCH 31.7 26.0 - 34.0 pg   MCHC 32.9 30.0 - 36.0 g/dL   RDW 14.3 11.5 - 15.5 %   Platelets 194 150 - 400 K/uL  Glucose, capillary     Status: Abnormal   Collection Time: 03/12/14  8:33 AM  Result Value Ref Range   Glucose-Capillary 147 (H) 70 - 99 mg/dL  Glucose, capillary     Status: Abnormal   Collection Time: 03/12/14 12:48 PM  Result Value Ref Range  Glucose-Capillary 148 (H) 70 - 99 mg/dL  Glucose, capillary     Status: Abnormal   Collection Time: 03/12/14  5:48 PM  Result Value Ref Range   Glucose-Capillary 144 (H) 70 - 99 mg/dL  Glucose, capillary     Status: None   Collection Time: 03/12/14  9:38 PM  Result Value Ref Range   Glucose-Capillary 97 70 - 99 mg/dL  CBC     Status: Abnormal   Collection Time: 03/13/14  5:00 AM  Result Value Ref Range   WBC 14.1 (H) 4.0 - 10.5 K/uL   RBC 3.29 (L) 3.87 - 5.11 MIL/uL   Hemoglobin 10.0 (L) 12.0 - 15.0 g/dL   HCT 31.4 (L) 36.0 - 46.0 %   MCV  95.4 78.0 - 100.0 fL   MCH 30.4 26.0 - 34.0 pg   MCHC 31.8 30.0 - 36.0 g/dL   RDW 14.7 11.5 - 15.5 %   Platelets 218 150 - 400 K/uL  Basic metabolic panel     Status: Abnormal   Collection Time: 03/13/14  5:00 AM  Result Value Ref Range   Sodium 147 (H) 135 - 145 mmol/L    Comment: Please note change in reference range.   Potassium 3.8 3.5 - 5.1 mmol/L    Comment: Please note change in reference range.   Chloride 118 (H) 96 - 112 mEq/L   CO2 22 19 - 32 mmol/L   Glucose, Bld 51 (L) 70 - 99 mg/dL   BUN 52 (H) 6 - 23 mg/dL   Creatinine, Ser 1.55 (H) 0.50 - 1.10 mg/dL   Calcium 8.8 8.4 - 10.5 mg/dL   GFR calc non Af Amer 31 (L) >90 mL/min   GFR calc Af Amer 36 (L) >90 mL/min    Comment: (NOTE) The eGFR has been calculated using the CKD EPI equation. This calculation has not been validated in all clinical situations. eGFR's persistently <90 mL/min signify possible Chronic Kidney Disease.    Anion gap 7 5 - 15  Glucose, capillary     Status: Abnormal   Collection Time: 03/13/14  8:03 AM  Result Value Ref Range   Glucose-Capillary 48 (L) 70 - 99 mg/dL   Comment 1 Documented in Chart    Comment 2 Notify RN   Glucose, capillary     Status: Abnormal   Collection Time: 03/13/14 10:04 AM  Result Value Ref Range   Glucose-Capillary 154 (H) 70 - 99 mg/dL   Comment 1 Documented in Chart    Comment 2 Notify RN   Glucose, capillary     Status: Abnormal   Collection Time: 03/13/14  4:48 PM  Result Value Ref Range   Glucose-Capillary 145 (H) 70 - 99 mg/dL  Glucose, capillary     Status: None   Collection Time: 03/13/14  9:25 PM  Result Value Ref Range   Glucose-Capillary 74 70 - 99 mg/dL  Glucose, capillary     Status: Abnormal   Collection Time: 03/14/14  7:57 AM  Result Value Ref Range   Glucose-Capillary 48 (L) 70 - 99 mg/dL   Comment 1 Notify RN   Glucose, capillary     Status: Abnormal   Collection Time: 03/14/14  8:33 AM  Result Value Ref Range   Glucose-Capillary 105 (H) 70  - 99 mg/dL  Glucose, capillary     Status: Abnormal   Collection Time: 03/14/14 11:50 AM  Result Value Ref Range   Glucose-Capillary 199 (H) 70 - 99 mg/dL  Basic Metabolic Panel (  BMET)     Status: Abnormal   Collection Time: 03/23/14 11:11 AM  Result Value Ref Range   Sodium 140 135 - 145 mEq/L   Potassium 4.6 3.5 - 5.1 mEq/L   Chloride 101 96 - 112 mEq/L   CO2 28 19 - 32 mEq/L   Glucose, Bld 285 (H) 70 - 99 mg/dL   BUN 44 (H) 6 - 23 mg/dL   Creatinine, Ser 1.9 (H) 0.4 - 1.2 mg/dL   Calcium 8.8 8.4 - 10.5 mg/dL   GFR 27.01 (L) >60.00 mL/min  CULTURE, URINE COMPREHENSIVE     Status: None   Collection Time: 03/24/14  3:01 PM  Result Value Ref Range   Colony Count NO GROWTH    Organism ID, Bacteria NO GROWTH   Basic Metabolic Panel (BMET)     Status: Abnormal   Collection Time: 03/30/14  2:22 PM  Result Value Ref Range   Sodium 134 (L) 135 - 145 mEq/L   Potassium 4.6 3.5 - 5.1 mEq/L   Chloride 99 96 - 112 mEq/L   CO2 29 19 - 32 mEq/L   Glucose, Bld 215 (H) 70 - 99 mg/dL   BUN 37 (H) 6 - 23 mg/dL   Creatinine, Ser 1.3 (H) 0.4 - 1.2 mg/dL   Calcium 9.1 8.4 - 10.5 mg/dL   GFR 43.66 (L) >60.00 mL/min  Hemoglobin A1C     Status: Abnormal   Collection Time: 04/07/14 12:03 PM  Result Value Ref Range   Hgb A1c MFr Bld 7.7 (H) 4.6 - 6.5 %    Comment: Glycemic Control Guidelines for People with Diabetes:Non Diabetic:  <6%Goal of Therapy: <7%Additional Action Suggested:  >9%   Basic metabolic panel     Status: Abnormal   Collection Time: 04/07/14 12:03 PM  Result Value Ref Range   Sodium 133 (L) 135 - 145 mEq/L   Potassium 4.2 3.5 - 5.1 mEq/L   Chloride 96 96 - 112 mEq/L   CO2 31 19 - 32 mEq/L   Glucose, Bld 188 (H) 70 - 99 mg/dL   BUN 56 (H) 6 - 23 mg/dL   Creatinine, Ser 1.38 (H) 0.40 - 1.20 mg/dL   Calcium 9.0 8.4 - 10.5 mg/dL   GFR 39.30 (L) >60.00 mL/min    Assessment/Plan: Acute confusional state Resolved.  Back to baseline impairment.  Encouraged family to keep  Klonopin on hand in case of sundowning and agitation.  Discussed memory aids but family declines at present.  Will continue to monitor.   DM II (diabetes mellitus, type II), controlled Food consistency and carbohydrate modification discussed with patient and daughter.  Will check repeat BMP and A1C as due.  Continue current regimen for now.  Daughter to check glucose as directed and bring in a log or call me with glucose readings. Will alter regimen based on results and home glucose measurements.

## 2014-04-12 NOTE — Assessment & Plan Note (Signed)
Food consistency and carbohydrate modification discussed with patient and daughter.  Will check repeat BMP and A1C as due.  Continue current regimen for now.  Daughter to check glucose as directed and bring in a log or call me with glucose readings. Will alter regimen based on results and home glucose measurements.

## 2014-04-13 NOTE — Telephone Encounter (Signed)
Thank you Dr. Etter Sjogren for seeing patient in my absence.  I know she is in great hands!

## 2014-04-13 NOTE — Telephone Encounter (Signed)
yes

## 2014-04-13 NOTE — Telephone Encounter (Signed)
Spoke with daughter in [employer], Helene Kelp.  She stated that pt is getting enough fluids.  Urine output appears to be sufficient.  Urine color-"pretty clear".  Denies leg swelling or shortness of breath.    Dr. Etter Sjogren, would you be willing to see this patient tomorrow at 1:15pm?

## 2014-04-13 NOTE — Telephone Encounter (Signed)
Thank you!  Appointment scheduled.

## 2014-04-13 NOTE — Telephone Encounter (Signed)
No problem--- we are all a team.

## 2014-04-14 ENCOUNTER — Ambulatory Visit (INDEPENDENT_AMBULATORY_CARE_PROVIDER_SITE_OTHER): Payer: Medicare Other | Admitting: Family Medicine

## 2014-04-14 ENCOUNTER — Encounter: Payer: Self-pay | Admitting: Family Medicine

## 2014-04-14 VITALS — BP 114/68 | HR 94 | Temp 97.9°F | Wt 111.2 lb

## 2014-04-14 DIAGNOSIS — N289 Disorder of kidney and ureter, unspecified: Secondary | ICD-10-CM

## 2014-04-14 DIAGNOSIS — N184 Chronic kidney disease, stage 4 (severe): Secondary | ICD-10-CM

## 2014-04-14 NOTE — Patient Instructions (Signed)
Blood Urea Nitrogen Test The blood urea nitrogen (BUN) test measures the amount of urea nitrogen in your blood. Urea nitrogen is produced in the liver. It is the waste produced during the breakdown of protein, which occurs when excess protein in your body is used for energy. Urea nitrogen is released into the bloodstream. It is carried by your blood to your kidneys, where it is filtered out. It is excreted in your urine. Since this is an ongoing process, there is a consistent amount of urea nitrogen in your blood.  Most diseases or conditions that affect the kidneys or liver can affect the amount of urea present in your blood. The BUN test is primarily used to evaluate kidney function and to monitor patients with acute or chronic kidney dysfunction or failure.  There are two general methods for measuring BUN. One method (kinetic) uses a chemical that reacts with urea in your blood sample to produce a measurable color change. The deeper the color, the higher the BUN level in the sample. The second method (enzymatic) is more specific and uses an enzyme to change urea into specific products. The concentration of one of the products is measured and is directly related to the amount of BUN in your blood sample. RESULTS It is your responsibility to obtain your test results. Ask the lab or department performing the test when and how you will get your results. Contact your health care provider to discuss any questions you have about your results. Range of Normal Values Ranges for normal values may vary among different labs and hospitals. You should always check with your health care provider after having lab work or other tests done to discuss the meaning of your test results and whether your values are considered within normal limits. BUN values may vary depending on gender and age:  Adult men: 8-24 mg/dL or 2.9-8.6 mmol/L (SI units).  Adult women: 6-21 mg/dL or 2.1-7.5 mmol/L (SI units).  Children older than  2 years: 5-20 mg/dL or 1.8-7.47mmol/L (SI units).  Children younger than 2 years: 4-15 mg/dL or 1.4-5.4 mmol/L (SI units).  Newborns: 3-25 mg/dL or 1.1-8.9 mmol/L (SI units). Meaning of Results Outside of Normal Value Ranges Abnormally high BUN values, especially values greater than 50 mg/dL or 17.8 mmol/L (SI units), generally mean that your kidneys are not functioning (filtering wastes from your blood) well. Abnormally high BUN values can also occur in people with urinary tract obstruction and conditions associated with low blood flow to the kidneys. Dehydration, fever, and congestive heart failure can cause low blood flow to your kidneys. BUN values can also be abnormally high in people who eat a high-protein diet and in conditions associated with increased protein breakdown, such as:  Gastrointestinal bleeding.  Acute myocardial infarction.  Shock.  Burns. Abnormally low BUN values are common during pregnancy. Abnormally low BUN values can also be seen with:  Malnutrition.  Low-protein, high-carbohydrate diets.  Excessive hydration.  Liver failure. Document Released: 03/29/2004 Document Revised: 07/21/2013 Document Reviewed: 12/14/2011 [facility] Patient Information 2015 [place], [place]. This information is not intended to replace advice given to you by your health care provider. Make sure you discuss any questions you have with your health care provider.  Serum Creatinine Test A creatinine test measures the amount of creatinine in your blood serum. Creatinine is a waste product of normal muscle activity (contraction). The kidneys filter creatinine from your blood and excrete it from your body in urine. Blood creatinine levels stay consistent in people whose muscle  mass stays consistent. However, this level can be increased in people who perform resistance exercise to increase muscle mass.  The amount of creatinine in your blood serum is measured by a laboratory instrument. A sample of  your blood is taken. An instrument causes chemical reactions to produce a color change in your blood sample. The degree of color change is also measured by the instrument. The degree of color change is directly related to the amount of creatinine in the sample. The more creatinine there is in your blood, the deeper the color becomes.  RESULTS It is your responsibility to obtain your test results. Ask the lab or department performing the test when and how you will get your results. Contact your caregiver to discuss any questions you have about your results. Range of Normal Values Ranges for normal values may vary among different labs and hospitals. You should always check with your doctor after having lab work or other tests done to discuss the meaning of your test results and whether your values are considered within normal limits. After adolescence, normal values of serum creatinine are different between females and males because adult males typically have more muscle mass than adult females. The range of values for serum creatinine are listed as follows:  Adult:  Female--0.5-1.1 mg/dL or 44-7 micromole/L (SI units)  Female--0.6-1.2 mg/dL or 53-106 micromole/L (SI units)  Adolescent--0.5-1.0 mg/dL or 44 micromole/L (SI units)  Child--0.3-0.7 mg/dL or 27-62 micromole/L (SI units)  Infant--0.2-0.4 mg/dL or 18-35 micromole/L (SI units)  Newborn--0.3-1.2 mg/dL or 27-106 micromole/L (SI units) The range of normal values may be higher in people who perform resistance exercise to increase muscle mass.  Meaning of Results Outside of Normal Value Ranges Abnormally high serum creatinine levels are most commonly seen in renal failure. When your kidneys are not working, the creatinine is not removed. The levels of creatinine then build up in your blood. Abnormally high serum creatinine levels also can be seen with dehydration, overactive thyroid glands (hyperthyroidism), conditions related to overgrowth of  the body (acromegaly or gigantism), abnormal breakdown of muscle tissue (rhabdomyolysis), and early muscular dystrophy. Abnormally low serum creatinine levels may indicate low muscle mass associated with malnutrition, lack of activity, increasing age, or late-stage muscular dystrophy. Abnormally low creatinine values also are seen during the first 6 months of pregnancy. Document Released: 03/29/2004 Document Revised: 02/21/2012 Document Reviewed: 01/22/2012 [facility] Patient Information 2015 [place], [place]. This information is not intended to replace advice given to you by your health care provider. Make sure you discuss any questions you have with your health care provider.

## 2014-04-14 NOTE — Progress Notes (Signed)
Subjective:    Patient ID: Barbara Garner, female    DOB: [birth year], 80 y.o.   MRN: [id number]  HPI Pt is here with daughter to f/u bun cr.  Pt denies sob, cp or swelling.  She is drinking plenty of fluids.   No other complaints.   Past Medical History  Diagnosis Date  . Iron deficiency anemia, unspecified   . Chronic diastolic CHF (congestive heart failure)   . CAD  S/P CABG x 3 08/2011     LIMA to diagonal branch, SVG to OM1, SVG to PDA, EVH via bilateral thighs  . Type II or unspecified type diabetes mellitus without mention of complication, not stated as uncontrolled   . Contact dermatitis and other eczema, due to unspecified cause   . Esophageal reflux   . Headache(784.0)   . Pacemaker MDT dual chamber     DOI 08/2011  . Pure hypercholesterolemia   . hypertension   . Hypothyroidism   . Atrioventricular block, complete -intermittent   . Memory loss   . Idiopathic peripheral neuropathy   . LBBB (left bundle branch block)   . Carotid stenosis     Carotid dopplers 4/09 with 81% LICA stenosis  . Severe aortic stenosis -S/P AVR      19 mm [facility] Ease pericardial tissue valve  . Extrinsic asthma, unspecified     hasn't used inhaler in past year  . CKD (chronic kidney disease) stage 4, GFR 15-29 ml/min     Cr 1.6 in 6/11, sees Dr. Arty Baumgartner  . Impaired vision     pt. reports that she identifies her meds by looking at the pillls, she is not able to read labels on bottles   History   Social History  . Marital Status: Widowed    Spouse Name: N/A    Number of Children: 3  . Years of Education: N/A   Occupational History  . Retired    Social History Main Topics  . Smoking status: Never Smoker   . Smokeless tobacco: Never Used  . Alcohol Use: No  . Drug Use: No  . Sexual Activity: No   Other Topics Concern  . Not on file   Social History Narrative   Widowed.  Lives with son in [place].            Current Outpatient Prescriptions  Medication Sig  Dispense Refill  . ammonium lactate (AMLACTIN) 12 % lotion Apply 1 application topically as needed for dry skin. 225 g 0  . aspirin 81 MG EC tablet Take 1 tablet (81 mg total) by mouth daily. 30 tablet 3  . atorvastatin (LIPITOR) 40 MG tablet Take 40 mg by mouth daily.    . Blood Glucose Monitoring Suppl (ONE TOUCH ULTRA MINI) W/DEVICE KIT USE AS DIRECTED TO TEST BLOOD GLUCOSE ONCE DAILY DX: 250.00 1 each 0  . carvedilol (COREG) 25 MG tablet Take 12.5 mg by mouth 2 (two) times daily after a meal.    . Cholecalciferol (VITAMIN D-3) 1000 UNITS CAPS Take 1 capsule by mouth daily.    . clonazePAM (KLONOPIN) 0.5 MG tablet Take 1 tablet (0.5 mg total) by mouth at bedtime. 20 tablet 1  . fluticasone (FLONASE) 50 MCG/ACT nasal spray Place 2 sprays into both nostrils daily. 16 g 0  . glipiZIDE (GLUCOTROL XL) 10 MG 24 hr tablet TAKE ONE TABLET BY MOUTH IN THE MORNING 30 tablet 2  . glucose blood test strip Use as instructed to test blood glucose  once daily Dx: E11.9 100 each 12  . HYDROcodone-acetaminophen (NORCO/VICODIN) 5-325 MG per tablet Take 1 tablet by mouth every 6 (six) hours as needed for moderate pain. BACK PAIN    . hydrocortisone cream 1 % Apply topically 2 (two) times daily. Apply to rash BID until rash cleared. Avoid face and eye area. 30 g 3  . levothyroxine (SYNTHROID, LEVOTHROID) 50 MCG tablet Take 1 tablet (50 mcg total) by mouth daily before breakfast. 90 tablet 1  . nystatin ointment (MYCOSTATIN) Apply 1 application topically 2 (two) times daily. 30 g 1  . albuterol (PROVENTIL HFA;VENTOLIN HFA) 108 (90 BASE) MCG/ACT inhaler Inhale 2 puffs into the lungs every 6 (six) hours as needed for wheezing or shortness of breath. (Patient not taking: Reported on 04/14/2014) 1 Inhaler 0  . furosemide (LASIX) 40 MG tablet Take 0.5 tablets (20 mg total) by mouth daily. (Patient not taking: Reported on 04/14/2014) 90 tablet 1   No current facility-administered medications for this visit.     Review of  Systems  Constitutional: Negative for fever, chills, diaphoresis, activity change, appetite change, fatigue and unexpected weight change.  Respiratory: Negative for chest tightness.        Objective:   Physical Exam BP 114/68 mmHg  Pulse 94  Temp(Src) 97.9 F (36.6 C) (Oral)  Wt 111 lb 3.2 oz (50.44 kg)  SpO2 97% General appearance: alert, cooperative, appears stated age and no distress Neck: no carotid bruit, no JVD, supple, symmetrical, trachea midline and thyroid not enlarged, symmetric, no tenderness/mass/nodules Lungs: clear to auscultation bilaterally Heart: S1, S2 normal Extremities: extremities normal, atraumatic, no cyanosis or edema       Assessment & Plan:  1. Abnormal kidney function Lab Results  Component Value Date   CREATININE 1.38* 04/07/2014   Lab Results  Component Value Date   BUN 56* 04/07/2014    No sob or cp, no edema Pt drinking 4-5 , 16 oz water with crystal light - Basic metabolic panel

## 2014-04-14 NOTE — Progress Notes (Signed)
Pre visit review using our clinic review tool, if applicable. No additional management support is needed unless otherwise documented below in the visit note. 

## 2014-04-14 NOTE — Assessment & Plan Note (Signed)
Pt bun/ cr creeping up Pt drinking 4-5 16 oz water with crystal light No sob, no edema Recheck bmp today

## 2014-04-15 LAB — BASIC METABOLIC PANEL
BUN: 40 mg/dL — ABNORMAL HIGH (ref 6–23)
CALCIUM: 9.2 mg/dL (ref 8.4–10.5)
CO2: 28 mEq/L (ref 19–32)
Chloride: 102 mEq/L (ref 96–112)
Creatinine, Ser: 1.15 mg/dL (ref 0.40–1.20)
GFR: 48.5 mL/min — ABNORMAL LOW (ref >60.00)
GLUCOSE: 130 mg/dL — AB (ref 70–99)
Potassium: 4.5 mEq/L (ref 3.5–5.1)
Sodium: 137 mEq/L (ref 135–145)

## 2014-04-20 ENCOUNTER — Encounter: Payer: Self-pay | Admitting: Internal Medicine

## 2014-05-08 ENCOUNTER — Encounter: Payer: Self-pay | Admitting: Physician Assistant

## 2014-05-08 ENCOUNTER — Ambulatory Visit (INDEPENDENT_AMBULATORY_CARE_PROVIDER_SITE_OTHER): Payer: Medicare Other | Admitting: Physician Assistant

## 2014-05-08 ENCOUNTER — Telehealth: Payer: Self-pay | Admitting: *Deleted

## 2014-05-08 VITALS — BP 127/57 | HR 74 | Temp 97.6°F | Resp 14 | Ht 62.0 in | Wt 112.0 lb

## 2014-05-08 DIAGNOSIS — N189 Chronic kidney disease, unspecified: Secondary | ICD-10-CM

## 2014-05-08 DIAGNOSIS — E875 Hyperkalemia: Secondary | ICD-10-CM

## 2014-05-08 DIAGNOSIS — N182 Chronic kidney disease, stage 2 (mild): Secondary | ICD-10-CM | POA: Insufficient documentation

## 2014-05-08 DIAGNOSIS — K5900 Constipation, unspecified: Secondary | ICD-10-CM | POA: Insufficient documentation

## 2014-05-08 LAB — BASIC METABOLIC PANEL
BUN: 40 mg/dL — AB (ref 6–23)
CO2: 30 mEq/L (ref 19–32)
Calcium: 9.8 mg/dL (ref 8.4–10.5)
Chloride: 105 mEq/L (ref 96–112)
Creatinine, Ser: 1.33 mg/dL — ABNORMAL HIGH (ref 0.40–1.20)
GFR: 41 mL/min — ABNORMAL LOW (ref >60.00)
GLUCOSE: 241 mg/dL — AB (ref 70–99)
POTASSIUM: 5.6 meq/L — AB (ref 3.5–5.1)
Sodium: 140 mEq/L (ref 135–145)

## 2014-05-08 NOTE — Telephone Encounter (Signed)
Notified pt's son and scheduled lab visit for 05/11/14 at 10:15. Lab order entered.

## 2014-05-08 NOTE — Telephone Encounter (Signed)
-----   Message from [name], PA-C sent at 05/08/2014  3:23 PM EST ----- Potassium is high and kidney function has declined slightly -- stop the Milk of Magnesia and continue the other measures discussed today.  Need her to return to lab on Monday to recheck potassium level.  Recommend fleet's enema if no BM in 3 days instead of Milk of Magnesia.

## 2014-05-08 NOTE — Patient Instructions (Signed)
Please continue good diet and fluid intake.  Start a daily probiotic and daily stool softener (Senekot).  Continue warm prune juice.  This should help with the constipation. The Apollo should be reserved if no bowel movement in 48 hours.   Follow-up in 2-3 weeks.  I will call you regarding the temporary assisted living, etc.

## 2014-05-08 NOTE — Assessment & Plan Note (Signed)
Will repeat BMP today to assess stability.  Continue current measures.

## 2014-05-08 NOTE — Progress Notes (Signed)
Pre visit review using our clinic review tool, if applicable. No additional management support is needed unless otherwise documented below in the visit note/SLS  

## 2014-05-08 NOTE — Progress Notes (Signed)
Patient presents to clinic today for 1 month follow-up of CKD.  Last check, Cr was improving.  Caregiver and patient endorses good fluid intake and adequate urinary output.  Denies swelling.  Do note some constipation requiring Milk of Magnesia to help facilitate bowel movement.  Endorses good fiber intake.  Have not taken anything else for symptoms.  Past Medical History  Diagnosis Date  . Iron deficiency anemia, unspecified   . Chronic diastolic CHF (congestive heart failure)   . CAD  S/P CABG x 3 08/2011     LIMA to diagonal branch, SVG to OM1, SVG to PDA, EVH via bilateral thighs  . Type II or unspecified type diabetes mellitus without mention of complication, not stated as uncontrolled   . Contact dermatitis and other eczema, due to unspecified cause   . Esophageal reflux   . Headache(784.0)   . Pacemaker MDT dual chamber     DOI 08/2011  . Pure hypercholesterolemia   . hypertension   . Hypothyroidism   . Atrioventricular block, complete -intermittent   . Memory loss   . Idiopathic peripheral neuropathy   . LBBB (left bundle branch block)   . Carotid stenosis     Carotid dopplers 5/10 with 25% LICA stenosis  . Severe aortic stenosis -S/P AVR      19 mm [facility] Ease pericardial tissue valve  . Extrinsic asthma, unspecified     hasn't used inhaler in past year  . CKD (chronic kidney disease) stage 4, GFR 15-29 ml/min     Cr 1.6 in 6/11, sees Dr. Arty Baumgartner  . Impaired vision     pt. reports that she identifies her meds by looking at the pillls, she is not able to read labels on bottles    Current Outpatient Prescriptions on File Prior to Visit  Medication Sig Dispense Refill  . albuterol (PROVENTIL HFA;VENTOLIN HFA) 108 (90 BASE) MCG/ACT inhaler Inhale 2 puffs into the lungs every 6 (six) hours as needed for wheezing or shortness of breath. 1 Inhaler 0  . ammonium lactate (AMLACTIN) 12 % lotion Apply 1 application topically as needed for dry skin. 225 g 0  . aspirin  81 MG EC tablet Take 1 tablet (81 mg total) by mouth daily. 30 tablet 3  . atorvastatin (LIPITOR) 40 MG tablet Take 40 mg by mouth daily.    . Blood Glucose Monitoring Suppl (ONE TOUCH ULTRA MINI) W/DEVICE KIT USE AS DIRECTED TO TEST BLOOD GLUCOSE ONCE DAILY DX: 250.00 1 each 0  . carvedilol (COREG) 25 MG tablet Take 12.5 mg by mouth 2 (two) times daily after a meal.    . Cholecalciferol (VITAMIN D-3) 1000 UNITS CAPS Take 1 capsule by mouth daily.    . clonazePAM (KLONOPIN) 0.5 MG tablet Take 1 tablet (0.5 mg total) by mouth at bedtime. 20 tablet 1  . fluticasone (FLONASE) 50 MCG/ACT nasal spray Place 2 sprays into both nostrils daily. 16 g 0  . glipiZIDE (GLUCOTROL XL) 10 MG 24 hr tablet TAKE ONE TABLET BY MOUTH IN THE MORNING 30 tablet 2  . glucose blood test strip Use as instructed to test blood glucose once daily Dx: E11.9 100 each 12  . HYDROcodone-acetaminophen (NORCO/VICODIN) 5-325 MG per tablet Take 1 tablet by mouth every 6 (six) hours as needed for moderate pain. BACK PAIN    . levothyroxine (SYNTHROID, LEVOTHROID) 50 MCG tablet Take 1 tablet (50 mcg total) by mouth daily before breakfast. 90 tablet 1   No current facility-administered  medications on file prior to visit.    Allergies  Allergen Reactions  . Keflex [Cephalexin]     rash  . Metformin     REACTION: intolerant  . Promethazine Hcl     REACTION: u/k  . Diflucan [Fluconazole] Rash    Renal Failure    Family History  Problem Relation Age of Onset  . Stroke Father     died in his 48's  . Stroke Mother     died in her 61's  . Diabetes Father   . Prostate cancer Son   . Diabetes Son     #2  . Hypertension Son     #3  . Thyroid disease Neg Hx   . Heart attack Neg Hx     History   Social History  . Marital Status: Widowed    Spouse Name: N/A  . Number of Children: 3  . Years of Education: N/A   Occupational History  . Retired    Social History Main Topics  . Smoking status: Never Smoker   .  Smokeless tobacco: Never Used  . Alcohol Use: No  . Drug Use: No  . Sexual Activity: No   Other Topics Concern  . None   Social History Narrative   Widowed.  Lives with son in [place].             Review of Systems - See HPI.  All other ROS are negative.  BP 127/57 mmHg  Pulse 74  Temp(Src) 97.6 F (36.4 C) (Oral)  Resp 14  Ht '5\' 2"'  (1.575 m)  Wt 112 lb (50.803 kg)  BMI 20.48 kg/m2  SpO2 99%  Physical Exam  Constitutional: She is oriented to person, place, and time and well-developed, well-nourished, and in no distress.  Cardiovascular: Normal rate, regular rhythm, normal heart sounds and intact distal pulses.   Pulmonary/Chest: Effort normal and breath sounds normal. No respiratory distress. She has no wheezes. She has no rales. She exhibits no tenderness.  Abdominal: Soft. Bowel sounds are normal. She exhibits no distension and no mass. There is no tenderness. There is no rebound and no guarding.  Neurological: She is alert and oriented to person, place, and time.  Skin: Skin is warm and dry. No rash noted.  Vitals reviewed.  Recent Results (from the past 2160 hour(s))  Cervicovaginal ancillary only     Status: None   Collection Time: 03/03/14 12:00 AM  Result Value Ref Range   Wet Prep (BD Affirm) Candida: Negative     Comment: Normal Reference Range - Negative   Wet Prep (BD Affirm) Gardnerella: Negative     Comment: Normal Reference Range - Negative   Wet Prep (BD Affirm) Trichomonas: Negative     Comment: Normal Reference Range - Negative  POCT urinalysis dipstick     Status: Abnormal   Collection Time: 03/03/14 11:52 AM  Result Value Ref Range   Color, UA gold    Clarity, UA cloudy    Glucose, UA neg    Bilirubin, UA neg    Ketones, UA neg    Spec Grav, UA 1.025    Blood, UA neg    pH, UA 5.0    Protein, UA positive    Urobilinogen, UA 0.2    Nitrite, UA neg    Leukocytes, UA large (3+)   Basic metabolic panel     Status: Abnormal   Collection  Time: 03/05/14  3:55 PM  Result Value Ref Range   Sodium 139  135 - 145 mEq/L   Potassium 5.2 (H) 3.5 - 5.1 mEq/L   Chloride 103 96 - 112 mEq/L   CO2 26 19 - 32 mEq/L   Glucose, Bld 123 (H) 70 - 99 mg/dL   BUN 62 (H) 6 - 23 mg/dL   Creatinine, Ser 1.7 (H) 0.4 - 1.2 mg/dL   Calcium 9.4 8.4 - 10.5 mg/dL   GFR 31.76 (L) >60.00 mL/min  CULTURE, URINE COMPREHENSIVE     Status: None   Collection Time: 03/05/14  5:21 PM  Result Value Ref Range   Culture METHICILLIN RESISTANT STAPHYLOCOCCUS AUREUS    Colony Count 25,000 COLONIES/ML    Organism ID, Bacteria METHICILLIN RESISTANT STAPHYLOCOCCUS AUREUS     Comment: Rifampin and Gentamicin should not be used as single drugs for treatment of Staph infections.       Susceptibility   Methicillin resistant staphylococcus aureus -  (no method available)    PENICILLIN >=0.5 Resistant     OXACILLIN >=4 Resistant     CEFAZOLIN  Resistant     GENTAMICIN <=0.5 Sensitive     CIPROFLOXACIN >=8 Resistant     LEVOFLOXACIN 4 Intermediate     NITROFURANTOIN <=16 Sensitive     TRIMETH/SULFA <=10 Sensitive     VANCOMYCIN <=0.5 Sensitive     LINEZOLID 2 Sensitive     RIFAMPIN <=0.5 Sensitive     TETRACYCLINE <=1 Sensitive   Implantable device check     Status: None   Collection Time: 03/05/14  8:02 PM  Result Value Ref Range   Date Time Interrogation Session [id number]    Pulse Generator Manufacturer Medtronic    Pulse Gen Model [id number] Adapta    Pulse Gen Serial Number [id number]    RV Sense Sensitivity 5.6 mV   RA Pace Amplitude 2 V   RV Pace PulseWidth 0.4 ms   RV Pace Amplitude 2.5 V   RA Impedance 432 ohm   RA Amplitude 2 mV   RA Pacing Amplitude 0.75 V   RA Pacing PulseWidth 0.4 ms   RV IMPEDANCE 621 ohm   RV Amplitude 15.67 mV   RV Pacing Amplitude 0.5 V   RV Pacing PulseWidth 0.4 ms   Battery Status Unknown    Battery Longevity 120 mo   Battery Voltage 2.79 V   Battery Impedance 206 ohm   Brady AP VP Percent 3 %   Brady AS VP  Percent 6 %   Brady AP VS Percent 65 %   Brady AS VS Percent 26 %   Eval Rhythm Brady @ 48    Miscellaneous Comment      Pacemaker check in clinic. Normal device function. Thresholds, sensing, impedances consistent with previous measurements. Device programmed to maximize longevity. 13 mode switches all < 1 minute.  No high ventricular rates noted. Device programmed at  appropriate safety margins. Histogram distribution appropriate for patient activity level. Device programmed to optimize intrinsic conduction. Estimated longevity 10 years. Patient enrolled in remote follow-up/TTM's with [employer]. Plan to follow every 3  months remotely and see annually in office. Patient education completed.  [facility] 06/04/14.   Comprehensive metabolic panel     Status: Abnormal   Collection Time: 03/10/14  9:28 PM  Result Value Ref Range   Sodium 135 135 - 145 mmol/L    Comment: Please note change in reference range.   Potassium 4.3 3.5 - 5.1 mmol/L    Comment: Please note change in reference range.   Chloride 99 96 - 112 mEq/L  CO2 23 19 - 32 mmol/L   Glucose, Bld 222 (H) 70 - 99 mg/dL   BUN 90 (H) 6 - 23 mg/dL   Creatinine, Ser 2.49 (H) 0.50 - 1.10 mg/dL   Calcium 8.7 8.4 - 10.5 mg/dL   Total Protein 7.3 6.0 - 8.3 g/dL   Albumin 3.9 3.5 - 5.2 g/dL   AST 22 0 - 37 U/L   ALT 9 0 - 35 U/L   Alkaline Phosphatase 96 39 - 117 U/L   Total Bilirubin 0.5 0.3 - 1.2 mg/dL   GFR calc non Af Amer 18 (L) >90 mL/min   GFR calc Af Amer 20 (L) >90 mL/min    Comment: (NOTE) The eGFR has been calculated using the CKD EPI equation. This calculation has not been validated in all clinical situations. eGFR's persistently <90 mL/min signify possible Chronic Kidney Disease.    Anion gap 13 5 - 15  CBC     Status: Abnormal   Collection Time: 03/10/14 10:08 PM  Result Value Ref Range   WBC 10.3 4.0 - 10.5 K/uL   RBC 3.47 (L) 3.87 - 5.11 MIL/uL   Hemoglobin 11.0 (L) 12.0 - 15.0 g/dL   HCT 33.3 (L) 36.0 - 46.0 %    MCV 96.0 78.0 - 100.0 fL   MCH 31.7 26.0 - 34.0 pg   MCHC 33.0 30.0 - 36.0 g/dL   RDW 13.6 11.5 - 15.5 %   Platelets 200 150 - 400 K/uL  Differential     Status: Abnormal   Collection Time: 03/10/14 10:08 PM  Result Value Ref Range   Neutrophils Relative % 63 43 - 77 %   Neutro Abs 6.5 1.7 - 7.7 K/uL   Lymphocytes Relative 16 12 - 46 %   Lymphs Abs 1.6 0.7 - 4.0 K/uL   Monocytes Relative 7 3 - 12 %   Monocytes Absolute 0.7 0.1 - 1.0 K/uL   Eosinophils Relative 14 (H) 0 - 5 %   Eosinophils Absolute 1.5 (H) 0.0 - 0.7 K/uL   Basophils Relative 0 0 - 1 %   Basophils Absolute 0.0 0.0 - 0.1 K/uL  Urinalysis, Routine w reflex microscopic     Status: Abnormal   Collection Time: 03/10/14 11:07 PM  Result Value Ref Range   Color, Urine YELLOW YELLOW   APPearance CLEAR CLEAR   Specific Gravity, Urine 1.013 1.005 - 1.030   pH 5.0 5.0 - 8.0   Glucose, UA NEGATIVE NEGATIVE mg/dL   Hgb urine dipstick NEGATIVE NEGATIVE   Bilirubin Urine NEGATIVE NEGATIVE   Ketones, ur NEGATIVE NEGATIVE mg/dL   Protein, ur NEGATIVE NEGATIVE mg/dL   Urobilinogen, UA 0.2 0.0 - 1.0 mg/dL   Nitrite NEGATIVE NEGATIVE   Leukocytes, UA SMALL (A) NEGATIVE  Urine microscopic-add on     Status: Abnormal   Collection Time: 03/10/14 11:07 PM  Result Value Ref Range   Squamous Epithelial / LPF RARE RARE   WBC, UA 11-20 <3 WBC/hpf   RBC / HPF 0-2 <3 RBC/hpf   Bacteria, UA RARE RARE   Casts HYALINE CASTS (A) NEGATIVE  Urine culture     Status: None   Collection Time: 03/10/14 11:08 PM  Result Value Ref Range   Specimen Description URINE, CLEAN CATCH    Special Requests NONE    Culture  Setup Time      03/11/2014 08:25 Performed at [facility]      5,000 COLONIES/ML Performed at [facility]    Culture      INSIGNIFICANT GROWTH Performed at [facility]    Report Status 03/12/2014 FINAL   Comprehensive metabolic panel     Status: Abnormal   Collection Time: 03/11/14   5:30 AM  Result Value Ref Range   Sodium 135 135 - 145 mmol/L    Comment: Please note change in reference range.   Potassium 4.0 3.5 - 5.1 mmol/L    Comment: Please note change in reference range.   Chloride 101 96 - 112 mEq/L   CO2 18 (L) 19 - 32 mmol/L   Glucose, Bld 188 (H) 70 - 99 mg/dL   BUN 83 (H) 6 - 23 mg/dL   Creatinine, Ser 2.40 (H) 0.50 - 1.10 mg/dL   Calcium 8.4 8.4 - 10.5 mg/dL   Total Protein 6.4 6.0 - 8.3 g/dL   Albumin 3.4 (L) 3.5 - 5.2 g/dL   AST 22 0 - 37 U/L   ALT 12 0 - 35 U/L   Alkaline Phosphatase 91 39 - 117 U/L   Total Bilirubin 0.1 (L) 0.3 - 1.2 mg/dL   GFR calc non Af Amer 18 (L) >90 mL/min   GFR calc Af Amer 21 (L) >90 mL/min    Comment: (NOTE) The eGFR has been calculated using the CKD EPI equation. This calculation has not been validated in all clinical situations. eGFR's persistently <90 mL/min signify possible Chronic Kidney Disease.    Anion gap 16 (H) 5 - 15  CBC with Differential     Status: Abnormal   Collection Time: 03/11/14  5:30 AM  Result Value Ref Range   WBC 9.6 4.0 - 10.5 K/uL   RBC 3.42 (L) 3.87 - 5.11 MIL/uL   Hemoglobin 10.5 (L) 12.0 - 15.0 g/dL   HCT 32.2 (L) 36.0 - 46.0 %   MCV 94.2 78.0 - 100.0 fL   MCH 30.7 26.0 - 34.0 pg   MCHC 32.6 30.0 - 36.0 g/dL   RDW 14.1 11.5 - 15.5 %   Platelets 210 150 - 400 K/uL   Neutrophils Relative % 86 (H) 43 - 77 %   Neutro Abs 8.3 (H) 1.7 - 7.7 K/uL   Lymphocytes Relative 10 (L) 12 - 46 %   Lymphs Abs 1.0 0.7 - 4.0 K/uL   Monocytes Relative 1 (L) 3 - 12 %   Monocytes Absolute 0.1 0.1 - 1.0 K/uL   Eosinophils Relative 3 0 - 5 %   Eosinophils Absolute 0.3 0.0 - 0.7 K/uL   Basophils Relative 0 0 - 1 %   Basophils Absolute 0.0 0.0 - 0.1 K/uL  Sedimentation rate     Status: Abnormal   Collection Time: 03/11/14  5:30 AM  Result Value Ref Range   Sed Rate 33 (H) 0 - 22 mm/hr  C-reactive protein     Status: Abnormal   Collection Time: 03/11/14  5:30 AM  Result Value Ref Range   CRP 1.4  (H) <0.60 mg/dL    Comment: Performed at [facility]  Glucose, capillary     Status: Abnormal   Collection Time: 03/11/14  8:07 AM  Result Value Ref Range   Glucose-Capillary 211 (H) 70 - 99 mg/dL  Glucose, capillary     Status: Abnormal   Collection Time: 03/11/14 12:04 PM  Result Value Ref Range   Glucose-Capillary 166 (H) 70 - 99 mg/dL  Salicylate level     Status: None   Collection Time: 03/11/14  2:03 PM  Result Value Ref Range   Salicylate Lvl <3.8 2.8 - 20.0 mg/dL  Glucose, capillary     Status: Abnormal   Collection Time: 03/11/14  4:43 PM  Result Value Ref Range   Glucose-Capillary 174 (H) 70 - 99 mg/dL  Glucose, capillary     Status: Abnormal   Collection Time: 03/11/14  9:28 PM  Result Value Ref Range   Glucose-Capillary 166 (H) 70 - 99 mg/dL   Comment 1 Documented in Chart    Comment 2 Notify RN   Basic metabolic panel     Status: Abnormal   Collection Time: 03/12/14  6:00 AM  Result Value Ref Range   Sodium 140 135 - 145 mmol/L    Comment: Please note change in reference range.   Potassium 3.4 (L) 3.5 - 5.1 mmol/L    Comment: Please note change in reference range.   Chloride 110 96 - 112 mEq/L   CO2 18 (L) 19 - 32 mmol/L   Glucose, Bld 159 (H) 70 - 99 mg/dL   BUN 68 (H) 6 - 23 mg/dL   Creatinine, Ser 1.88 (H) 0.50 - 1.10 mg/dL   Calcium 8.6 8.4 - 10.5 mg/dL   GFR calc non Af Amer 25 (L) >90 mL/min   GFR calc Af Amer 29 (L) >90 mL/min    Comment: (NOTE) The eGFR has been calculated using the CKD EPI equation. This calculation has not been validated in all clinical situations. eGFR's persistently <90 mL/min signify possible Chronic Kidney Disease.    Anion gap 12 5 - 15  CBC     Status: Abnormal   Collection Time: 03/12/14  6:00 AM  Result Value Ref Range   WBC 12.2 (H) 4.0 - 10.5 K/uL   RBC 2.90 (L) 3.87 - 5.11 MIL/uL   Hemoglobin 9.2 (L) 12.0 - 15.0 g/dL   HCT 28.0 (L) 36.0 - 46.0 %   MCV 96.6 78.0 - 100.0 fL   MCH 31.7 26.0 - 34.0 pg    MCHC 32.9 30.0 - 36.0 g/dL   RDW 14.3 11.5 - 15.5 %   Platelets 194 150 - 400 K/uL  Glucose, capillary     Status: Abnormal   Collection Time: 03/12/14  8:33 AM  Result Value Ref Range   Glucose-Capillary 147 (H) 70 - 99 mg/dL  Glucose, capillary     Status: Abnormal   Collection Time: 03/12/14 12:48 PM  Result Value Ref Range   Glucose-Capillary 148 (H) 70 - 99 mg/dL  Glucose, capillary     Status: Abnormal   Collection Time: 03/12/14  5:48 PM  Result Value Ref Range   Glucose-Capillary 144 (H) 70 - 99 mg/dL  Glucose, capillary     Status: None   Collection Time: 03/12/14  9:38 PM  Result Value Ref Range   Glucose-Capillary 97 70 - 99 mg/dL  CBC     Status: Abnormal   Collection Time: 03/13/14  5:00 AM  Result Value Ref Range   WBC 14.1 (H) 4.0 - 10.5 K/uL   RBC 3.29 (L) 3.87 - 5.11 MIL/uL   Hemoglobin 10.0 (L) 12.0 - 15.0 g/dL   HCT 31.4 (L) 36.0 - 46.0 %   MCV 95.4 78.0 - 100.0 fL   MCH 30.4 26.0 - 34.0 pg   MCHC 31.8 30.0 - 36.0 g/dL   RDW 14.7 11.5 - 15.5 %   Platelets 218 150 - 400 K/uL  Basic metabolic panel     Status: Abnormal   Collection Time: 03/13/14  5:00 AM  Result Value Ref Range   Sodium 147 (H) 135 - 145 mmol/L    Comment: Please note change in reference range.   Potassium 3.8 3.5 - 5.1 mmol/L    Comment: Please note change in reference range.   Chloride 118 (H) 96 - 112 mEq/L   CO2 22 19 - 32 mmol/L   Glucose, Bld 51 (L) 70 - 99 mg/dL   BUN 52 (H) 6 - 23 mg/dL   Creatinine, Ser 1.55 (H) 0.50 - 1.10 mg/dL   Calcium 8.8 8.4 - 10.5 mg/dL   GFR calc non Af Amer 31 (L) >90 mL/min   GFR calc Af Amer 36 (L) >90 mL/min    Comment: (NOTE) The eGFR has been calculated using the CKD EPI equation. This calculation has not been validated in all clinical situations. eGFR's persistently <90 mL/min signify possible Chronic Kidney Disease.    Anion gap 7 5 - 15  Glucose, capillary     Status: Abnormal   Collection Time: 03/13/14  8:03 AM  Result Value Ref  Range   Glucose-Capillary 48 (L) 70 - 99 mg/dL   Comment 1 Documented in Chart    Comment 2 Notify RN   Glucose, capillary     Status: Abnormal   Collection Time: 03/13/14 10:04 AM  Result Value Ref Range   Glucose-Capillary 154 (H) 70 - 99 mg/dL   Comment 1 Documented in Chart    Comment 2 Notify RN   Glucose, capillary     Status: Abnormal   Collection Time: 03/13/14  4:48 PM  Result Value Ref Range   Glucose-Capillary 145 (H) 70 - 99 mg/dL  Glucose, capillary     Status: None   Collection Time: 03/13/14  9:25 PM  Result Value Ref Range   Glucose-Capillary 74 70 - 99 mg/dL  Glucose, capillary     Status: Abnormal   Collection Time: 03/14/14  7:57 AM  Result Value Ref Range   Glucose-Capillary 48 (L) 70 - 99 mg/dL   Comment 1 Notify RN   Glucose, capillary     Status: Abnormal   Collection Time: 03/14/14  8:33 AM  Result Value Ref Range   Glucose-Capillary 105 (H) 70 - 99 mg/dL  Glucose, capillary     Status: Abnormal   Collection Time: 03/14/14 11:50 AM  Result Value Ref Range   Glucose-Capillary 199 (H) 70 - 99 mg/dL  Basic Metabolic Panel (BMET)     Status: Abnormal   Collection Time: 03/23/14 11:11 AM  Result Value Ref Range   Sodium 140 135 - 145 mEq/L   Potassium 4.6 3.5 - 5.1 mEq/L   Chloride 101 96 - 112 mEq/L   CO2 28 19 - 32 mEq/L   Glucose, Bld 285 (H) 70 - 99 mg/dL   BUN 44 (H) 6 - 23 mg/dL   Creatinine, Ser 1.9 (H) 0.4 - 1.2 mg/dL   Calcium 8.8 8.4 - 10.5 mg/dL   GFR 27.01 (L) >60.00 mL/min  CULTURE, URINE COMPREHENSIVE     Status: None   Collection Time: 03/24/14  3:01 PM  Result Value Ref Range   Colony Count NO GROWTH    Organism ID, Bacteria NO GROWTH   Basic Metabolic Panel (BMET)     Status: Abnormal   Collection Time: 03/30/14  2:22 PM  Result Value Ref Range   Sodium 134 (L) 135 - 145 mEq/L   Potassium 4.6 3.5 - 5.1 mEq/L   Chloride 99 96 - 112 mEq/L  CO2 29 19 - 32 mEq/L   Glucose, Bld 215 (H) 70 - 99 mg/dL   BUN 37 (H) 6 - 23 mg/dL    Creatinine, Ser 1.3 (H) 0.4 - 1.2 mg/dL   Calcium 9.1 8.4 - 10.5 mg/dL   GFR 43.66 (L) >60.00 mL/min  Hemoglobin A1C     Status: Abnormal   Collection Time: 04/07/14 12:03 PM  Result Value Ref Range   Hgb A1c MFr Bld 7.7 (H) 4.6 - 6.5 %    Comment: Glycemic Control Guidelines for People with Diabetes:Non Diabetic:  <6%Goal of Therapy: <7%Additional Action Suggested:  >2%   Basic metabolic panel     Status: Abnormal   Collection Time: 04/07/14 12:03 PM  Result Value Ref Range   Sodium 133 (L) 135 - 145 mEq/L   Potassium 4.2 3.5 - 5.1 mEq/L   Chloride 96 96 - 112 mEq/L   CO2 31 19 - 32 mEq/L   Glucose, Bld 188 (H) 70 - 99 mg/dL   BUN 56 (H) 6 - 23 mg/dL   Creatinine, Ser 1.38 (H) 0.40 - 1.20 mg/dL   Calcium 9.0 8.4 - 10.5 mg/dL   GFR 39.30 (L) >60.00 mL/min  Basic metabolic panel     Status: Abnormal   Collection Time: 04/14/14  1:45 PM  Result Value Ref Range   Sodium 137 135 - 145 mEq/L   Potassium 4.5 3.5 - 5.1 mEq/L   Chloride 102 96 - 112 mEq/L   CO2 28 19 - 32 mEq/L   Glucose, Bld 130 (H) 70 - 99 mg/dL   BUN 40 (H) 6 - 23 mg/dL   Creatinine, Ser 1.15 0.40 - 1.20 mg/dL   Calcium 9.2 8.4 - 10.5 mg/dL   GFR 48.50 (L) >60.00 mL/min    Assessment/Plan: CN (constipation) Continue good hydration.  Start probiotic daily  and senekot every other day.  Warm prune juice.  Milk of Magnesia if no bowel movement in 2 days.  If not improving, will consider RX medication.   CKD (chronic kidney disease) Will repeat BMP today to assess stability.  Continue current measures.

## 2014-05-08 NOTE — Assessment & Plan Note (Signed)
Continue good hydration.  Start probiotic daily  and senekot every other day.  Warm prune juice.  Milk of Magnesia if no bowel movement in 2 days.  If not improving, will consider RX medication.

## 2014-05-11 ENCOUNTER — Other Ambulatory Visit (INDEPENDENT_AMBULATORY_CARE_PROVIDER_SITE_OTHER): Payer: Medicare Other

## 2014-05-11 DIAGNOSIS — E875 Hyperkalemia: Secondary | ICD-10-CM

## 2014-05-11 LAB — BASIC METABOLIC PANEL
BUN: 35 mg/dL — ABNORMAL HIGH (ref 6–23)
CO2: 28 meq/L (ref 19–32)
Calcium: 9.6 mg/dL (ref 8.4–10.5)
Chloride: 103 mEq/L (ref 96–112)
Creatinine, Ser: 1.18 mg/dL (ref 0.40–1.20)
GFR: 47.07 mL/min — ABNORMAL LOW (ref >60.00)
GLUCOSE: 221 mg/dL — AB (ref 70–99)
POTASSIUM: 5.3 meq/L — AB (ref 3.5–5.1)
Sodium: 139 mEq/L (ref 135–145)

## 2014-05-13 ENCOUNTER — Other Ambulatory Visit: Payer: Self-pay | Admitting: Physician Assistant

## 2014-05-13 NOTE — Telephone Encounter (Signed)
Rx request to pharmacy/SLS  

## 2014-05-14 ENCOUNTER — Encounter: Payer: Self-pay | Admitting: Physician Assistant

## 2014-05-25 ENCOUNTER — Encounter: Payer: Self-pay | Admitting: Physician Assistant

## 2014-05-25 ENCOUNTER — Ambulatory Visit (INDEPENDENT_AMBULATORY_CARE_PROVIDER_SITE_OTHER): Payer: Medicare Other | Admitting: Physician Assistant

## 2014-05-25 VITALS — BP 134/74 | HR 89 | Temp 97.5°F | Resp 16 | Ht 62.0 in | Wt 111.2 lb

## 2014-05-25 DIAGNOSIS — E875 Hyperkalemia: Secondary | ICD-10-CM

## 2014-05-25 DIAGNOSIS — K5900 Constipation, unspecified: Secondary | ICD-10-CM

## 2014-05-25 LAB — BASIC METABOLIC PANEL
BUN: 44 mg/dL — AB (ref 6–23)
CO2: 28 mEq/L (ref 19–32)
CREATININE: 1.37 mg/dL — AB (ref 0.40–1.20)
Calcium: 9.2 mg/dL (ref 8.4–10.5)
Chloride: 104 mEq/L (ref 96–112)
GFR: 39.62 mL/min — AB (ref >60.00)
Glucose, Bld: 213 mg/dL — ABNORMAL HIGH (ref 70–99)
POTASSIUM: 4.7 meq/L (ref 3.5–5.1)
Sodium: 137 mEq/L (ref 135–145)

## 2014-05-25 NOTE — Progress Notes (Signed)
Patient presents to clinic today for follow-up of constipation and milk-of-magnesia induced hyperkalemia.  Patient doing well overall.  Is having to use stool softener only rarely now.  Good daily bowel movement without tenesmus, melena or hematochezia.  No more use of milk of magnesia.  Past Medical History  Diagnosis Date  . Iron deficiency anemia, unspecified   . Chronic diastolic CHF (congestive heart failure)   . CAD  S/P CABG x 3 08/2011     LIMA to diagonal branch, SVG to OM1, SVG to PDA, EVH via bilateral thighs  . Type II or unspecified type diabetes mellitus without mention of complication, not stated as uncontrolled   . Contact dermatitis and other eczema, due to unspecified cause   . Esophageal reflux   . Headache(784.0)   . Pacemaker MDT dual chamber     DOI 08/2011  . Pure hypercholesterolemia   . hypertension   . Hypothyroidism   . Atrioventricular block, complete -intermittent   . Memory loss   . Idiopathic peripheral neuropathy   . LBBB (left bundle branch block)   . Carotid stenosis     Carotid dopplers 2/75 with 17% LICA stenosis  . Severe aortic stenosis -S/P AVR      19 mm [facility] Ease pericardial tissue valve  . Extrinsic asthma, unspecified     hasn't used inhaler in past year  . CKD (chronic kidney disease) stage 4, GFR 15-29 ml/min     Cr 1.6 in 6/11, sees Dr. Arty Baumgartner  . Impaired vision     pt. reports that she identifies her meds by looking at the pillls, she is not able to read labels on bottles    Current Outpatient Prescriptions on File Prior to Visit  Medication Sig Dispense Refill  . albuterol (PROVENTIL HFA;VENTOLIN HFA) 108 (90 BASE) MCG/ACT inhaler Inhale 2 puffs into the lungs every 6 (six) hours as needed for wheezing or shortness of breath. 1 Inhaler 0  . ammonium lactate (AMLACTIN) 12 % lotion Apply 1 application topically as needed for dry skin. 225 g 0  . aspirin 81 MG EC tablet Take 1 tablet (81 mg total) by mouth daily. 30  tablet 3  . atorvastatin (LIPITOR) 40 MG tablet Take 40 mg by mouth daily.    . Blood Glucose Monitoring Suppl (ONE TOUCH ULTRA MINI) W/DEVICE KIT USE AS DIRECTED TO TEST BLOOD GLUCOSE ONCE DAILY DX: 250.00 1 each 0  . carvedilol (COREG) 25 MG tablet Take 12.5 mg by mouth 2 (two) times daily after a meal.    . Cholecalciferol (VITAMIN D-3) 1000 UNITS CAPS Take 1 capsule by mouth daily.    . clonazePAM (KLONOPIN) 0.5 MG tablet Take 1 tablet (0.5 mg total) by mouth at bedtime. 20 tablet 1  . fluticasone (FLONASE) 50 MCG/ACT nasal spray Place 2 sprays into both nostrils daily. 16 g 0  . glipiZIDE (GLUCOTROL XL) 10 MG 24 hr tablet TAKE ONE TABLET BY MOUTH IN THE MORNING 30 tablet 2  . glucose blood test strip Use as instructed to test blood glucose once daily Dx: E11.9 100 each 12  . HYDROcodone-acetaminophen (NORCO/VICODIN) 5-325 MG per tablet Take 1 tablet by mouth every 6 (six) hours as needed for moderate pain. BACK PAIN    . levothyroxine (SYNTHROID, LEVOTHROID) 50 MCG tablet Take 1 tablet (50 mcg total) by mouth daily before breakfast. 90 tablet 1  . magnesium hydroxide (MILK OF MAGNESIA) 400 MG/5ML suspension Take by mouth daily as needed for mild constipation.  No current facility-administered medications on file prior to visit.    Allergies  Allergen Reactions  . Keflex [Cephalexin]     rash  . Metformin     REACTION: intolerant  . Promethazine Hcl     REACTION: u/k  . Diflucan [Fluconazole] Rash    Renal Failure    Family History  Problem Relation Age of Onset  . Stroke Father     died in his 9's  . Stroke Mother     died in her 58's  . Diabetes Father   . Prostate cancer Son   . Diabetes Son     #2  . Hypertension Son     #3  . Thyroid disease Neg Hx   . Heart attack Neg Hx     History   Social History  . Marital Status: Widowed    Spouse Name: N/A  . Number of Children: 3  . Years of Education: N/A   Occupational History  . Retired    Social History  Main Topics  . Smoking status: Never Smoker   . Smokeless tobacco: Never Used  . Alcohol Use: No  . Drug Use: No  . Sexual Activity: No   Other Topics Concern  . None   Social History Narrative   Widowed.  Lives with son in [place].            Review of Systems - See HPI.  All other ROS are negative.  BP 134/74 mmHg  Pulse 89  Temp(Src) 97.5 F (36.4 C) (Oral)  Resp 16  Ht _0  (1.575 m)  Wt 111 lb 3.2 oz (50.44 kg)  BMI 20.33 kg/m2  SpO2 100%  Physical Exam  Constitutional: She is well-developed, well-nourished, and in no distress.  Cardiovascular: Normal rate, regular rhythm, normal heart sounds and intact distal pulses.   Pulmonary/Chest: Effort normal and breath sounds normal. No respiratory distress. She has no wheezes. She has no rales. She exhibits no tenderness.  Abdominal: Soft. Bowel sounds are normal. She exhibits no distension and no mass. There is no tenderness. There is no rebound and no guarding.  Skin: Skin is warm and dry. No rash noted.  Psychiatric: Affect normal.  Vitals reviewed.   Recent Results (from the past 2160 hour(s))  Cervicovaginal ancillary only     Status: None   Collection Time: 03/03/14 12:00 AM  Result Value Ref Range   Wet Prep (BD Affirm) Candida: Negative     Comment: Normal Reference Range - Negative   Wet Prep (BD Affirm) Gardnerella: Negative     Comment: Normal Reference Range - Negative   Wet Prep (BD Affirm) Trichomonas: Negative     Comment: Normal Reference Range - Negative  POCT urinalysis dipstick     Status: Abnormal   Collection Time: 03/03/14 11:52 AM  Result Value Ref Range   Color, UA gold    Clarity, UA cloudy    Glucose, UA neg    Bilirubin, UA neg    Ketones, UA neg    Spec Grav, UA 1.025    Blood, UA neg    pH, UA 5.0    Protein, UA positive    Urobilinogen, UA 0.2    Nitrite, UA neg    Leukocytes, UA large (3+)   Basic metabolic panel     Status: Abnormal   Collection Time: 03/05/14  3:55  PM  Result Value Ref Range   Sodium 139 135 - 145 mEq/L   Potassium 5.2 (H) 3.5 -  5.1 mEq/L   Chloride 103 96 - 112 mEq/L   CO2 26 19 - 32 mEq/L   Glucose, Bld 123 (H) 70 - 99 mg/dL   BUN 62 (H) 6 - 23 mg/dL   Creatinine, Ser 1.7 (H) 0.4 - 1.2 mg/dL   Calcium 9.4 8.4 - 10.5 mg/dL   GFR 31.76 (L) >60.00 mL/min  CULTURE, URINE COMPREHENSIVE     Status: None   Collection Time: 03/05/14  5:21 PM  Result Value Ref Range   Culture METHICILLIN RESISTANT STAPHYLOCOCCUS AUREUS    Colony Count 25,000 COLONIES/ML    Organism ID, Bacteria METHICILLIN RESISTANT STAPHYLOCOCCUS AUREUS     Comment: Rifampin and Gentamicin should not be used as single drugs for treatment of Staph infections.       Susceptibility   Methicillin resistant staphylococcus aureus -  (no method available)    PENICILLIN >=0.5 Resistant     OXACILLIN >=4 Resistant     CEFAZOLIN  Resistant     GENTAMICIN <=0.5 Sensitive     CIPROFLOXACIN >=8 Resistant     LEVOFLOXACIN 4 Intermediate     NITROFURANTOIN <=16 Sensitive     TRIMETH/SULFA <=10 Sensitive     VANCOMYCIN <=0.5 Sensitive     LINEZOLID 2 Sensitive     RIFAMPIN <=0.5 Sensitive     TETRACYCLINE <=1 Sensitive   Implantable device check     Status: None   Collection Time: 03/05/14  8:02 PM  Result Value Ref Range   Date Time Interrogation Session [id number]    Pulse Generator Manufacturer Medtronic    Pulse Gen Model [id number] Adapta    Pulse Gen Serial Number [id number]    RV Sense Sensitivity 5.6 mV   RA Pace Amplitude 2 V   RV Pace PulseWidth 0.4 ms   RV Pace Amplitude 2.5 V   RA Impedance 432 ohm   RA Amplitude 2 mV   RA Pacing Amplitude 0.75 V   RA Pacing PulseWidth 0.4 ms   RV IMPEDANCE 621 ohm   RV Amplitude 15.67 mV   RV Pacing Amplitude 0.5 V   RV Pacing PulseWidth 0.4 ms   Battery Status Unknown    Battery Longevity 120 mo   Battery Voltage 2.79 V   Battery Impedance 206 ohm   Brady AP VP Percent 3 %   Brady AS VP Percent 6 %   Brady AP  VS Percent 65 %   Brady AS VS Percent 26 %   Eval Rhythm Brady @ 48    Miscellaneous Comment      Pacemaker check in clinic. Normal device function. Thresholds, sensing, impedances consistent with previous measurements. Device programmed to maximize longevity. 13 mode switches all < 1 minute.  No high ventricular rates noted. Device programmed at  appropriate safety margins. Histogram distribution appropriate for patient activity level. Device programmed to optimize intrinsic conduction. Estimated longevity 10 years. Patient enrolled in remote follow-up/TTM's with [employer]. Plan to follow every 3  months remotely and see annually in office. Patient education completed.  [facility] 06/04/14.   Comprehensive metabolic panel     Status: Abnormal   Collection Time: 03/10/14  9:28 PM  Result Value Ref Range   Sodium 135 135 - 145 mmol/L    Comment: Please note change in reference range.   Potassium 4.3 3.5 - 5.1 mmol/L    Comment: Please note change in reference range.   Chloride 99 96 - 112 mEq/L   CO2 23 19 - 32 mmol/L   Glucose,  Bld 222 (H) 70 - 99 mg/dL   BUN 90 (H) 6 - 23 mg/dL   Creatinine, Ser 2.49 (H) 0.50 - 1.10 mg/dL   Calcium 8.7 8.4 - 10.5 mg/dL   Total Protein 7.3 6.0 - 8.3 g/dL   Albumin 3.9 3.5 - 5.2 g/dL   AST 22 0 - 37 U/L   ALT 9 0 - 35 U/L   Alkaline Phosphatase 96 39 - 117 U/L   Total Bilirubin 0.5 0.3 - 1.2 mg/dL   GFR calc non Af Amer 18 (L) >90 mL/min   GFR calc Af Amer 20 (L) >90 mL/min    Comment: (NOTE) The eGFR has been calculated using the CKD EPI equation. This calculation has not been validated in all clinical situations. eGFR's persistently <90 mL/min signify possible Chronic Kidney Disease.    Anion gap 13 5 - 15  CBC     Status: Abnormal   Collection Time: 03/10/14 10:08 PM  Result Value Ref Range   WBC 10.3 4.0 - 10.5 K/uL   RBC 3.47 (L) 3.87 - 5.11 MIL/uL   Hemoglobin 11.0 (L) 12.0 - 15.0 g/dL   HCT 33.3 (L) 36.0 - 46.0 %   MCV 96.0 78.0 - 100.0 fL     MCH 31.7 26.0 - 34.0 pg   MCHC 33.0 30.0 - 36.0 g/dL   RDW 13.6 11.5 - 15.5 %   Platelets 200 150 - 400 K/uL  Differential     Status: Abnormal   Collection Time: 03/10/14 10:08 PM  Result Value Ref Range   Neutrophils Relative % 63 43 - 77 %   Neutro Abs 6.5 1.7 - 7.7 K/uL   Lymphocytes Relative 16 12 - 46 %   Lymphs Abs 1.6 0.7 - 4.0 K/uL   Monocytes Relative 7 3 - 12 %   Monocytes Absolute 0.7 0.1 - 1.0 K/uL   Eosinophils Relative 14 (H) 0 - 5 %   Eosinophils Absolute 1.5 (H) 0.0 - 0.7 K/uL   Basophils Relative 0 0 - 1 %   Basophils Absolute 0.0 0.0 - 0.1 K/uL  Urinalysis, Routine w reflex microscopic     Status: Abnormal   Collection Time: 03/10/14 11:07 PM  Result Value Ref Range   Color, Urine YELLOW YELLOW   APPearance CLEAR CLEAR   Specific Gravity, Urine 1.013 1.005 - 1.030   pH 5.0 5.0 - 8.0   Glucose, UA NEGATIVE NEGATIVE mg/dL   Hgb urine dipstick NEGATIVE NEGATIVE   Bilirubin Urine NEGATIVE NEGATIVE   Ketones, ur NEGATIVE NEGATIVE mg/dL   Protein, ur NEGATIVE NEGATIVE mg/dL   Urobilinogen, UA 0.2 0.0 - 1.0 mg/dL   Nitrite NEGATIVE NEGATIVE   Leukocytes, UA SMALL (A) NEGATIVE  Urine microscopic-add on     Status: Abnormal   Collection Time: 03/10/14 11:07 PM  Result Value Ref Range   Squamous Epithelial / LPF RARE RARE   WBC, UA 11-20 <3 WBC/hpf   RBC / HPF 0-2 <3 RBC/hpf   Bacteria, UA RARE RARE   Casts HYALINE CASTS (A) NEGATIVE  Urine culture     Status: None   Collection Time: 03/10/14 11:08 PM  Result Value Ref Range   Specimen Description URINE, CLEAN CATCH    Special Requests NONE    Culture  Setup Time      03/11/2014 08:25 Performed at [facility]      5,000 COLONIES/ML Performed at [facility]  INSIGNIFICANT GROWTH Performed at [facility]    Report Status 03/12/2014 FINAL   Comprehensive metabolic panel     Status: Abnormal   Collection Time: 03/11/14  5:30 AM  Result Value  Ref Range   Sodium 135 135 - 145 mmol/L    Comment: Please note change in reference range.   Potassium 4.0 3.5 - 5.1 mmol/L    Comment: Please note change in reference range.   Chloride 101 96 - 112 mEq/L   CO2 18 (L) 19 - 32 mmol/L   Glucose, Bld 188 (H) 70 - 99 mg/dL   BUN 83 (H) 6 - 23 mg/dL   Creatinine, Ser 2.40 (H) 0.50 - 1.10 mg/dL   Calcium 8.4 8.4 - 10.5 mg/dL   Total Protein 6.4 6.0 - 8.3 g/dL   Albumin 3.4 (L) 3.5 - 5.2 g/dL   AST 22 0 - 37 U/L   ALT 12 0 - 35 U/L   Alkaline Phosphatase 91 39 - 117 U/L   Total Bilirubin 0.1 (L) 0.3 - 1.2 mg/dL   GFR calc non Af Amer 18 (L) >90 mL/min   GFR calc Af Amer 21 (L) >90 mL/min    Comment: (NOTE) The eGFR has been calculated using the CKD EPI equation. This calculation has not been validated in all clinical situations. eGFR's persistently <90 mL/min signify possible Chronic Kidney Disease.    Anion gap 16 (H) 5 - 15  CBC with Differential     Status: Abnormal   Collection Time: 03/11/14  5:30 AM  Result Value Ref Range   WBC 9.6 4.0 - 10.5 K/uL   RBC 3.42 (L) 3.87 - 5.11 MIL/uL   Hemoglobin 10.5 (L) 12.0 - 15.0 g/dL   HCT 32.2 (L) 36.0 - 46.0 %   MCV 94.2 78.0 - 100.0 fL   MCH 30.7 26.0 - 34.0 pg   MCHC 32.6 30.0 - 36.0 g/dL   RDW 14.1 11.5 - 15.5 %   Platelets 210 150 - 400 K/uL   Neutrophils Relative % 86 (H) 43 - 77 %   Neutro Abs 8.3 (H) 1.7 - 7.7 K/uL   Lymphocytes Relative 10 (L) 12 - 46 %   Lymphs Abs 1.0 0.7 - 4.0 K/uL   Monocytes Relative 1 (L) 3 - 12 %   Monocytes Absolute 0.1 0.1 - 1.0 K/uL   Eosinophils Relative 3 0 - 5 %   Eosinophils Absolute 0.3 0.0 - 0.7 K/uL   Basophils Relative 0 0 - 1 %   Basophils Absolute 0.0 0.0 - 0.1 K/uL  Sedimentation rate     Status: Abnormal   Collection Time: 03/11/14  5:30 AM  Result Value Ref Range   Sed Rate 33 (H) 0 - 22 mm/hr  C-reactive protein     Status: Abnormal   Collection Time: 03/11/14  5:30 AM  Result Value Ref Range   CRP 1.4 (H) <0.60 mg/dL     Comment: Performed at [facility]  Glucose, capillary     Status: Abnormal   Collection Time: 03/11/14  8:07 AM  Result Value Ref Range   Glucose-Capillary 211 (H) 70 - 99 mg/dL  Glucose, capillary     Status: Abnormal   Collection Time: 03/11/14 12:04 PM  Result Value Ref Range   Glucose-Capillary 166 (H) 70 - 99 mg/dL  Salicylate level     Status: None   Collection Time: 03/11/14  2:03 PM  Result Value Ref Range   Salicylate Lvl <0.0 2.8 -  20.0 mg/dL  Glucose, capillary     Status: Abnormal   Collection Time: 03/11/14  4:43 PM  Result Value Ref Range   Glucose-Capillary 174 (H) 70 - 99 mg/dL  Glucose, capillary     Status: Abnormal   Collection Time: 03/11/14  9:28 PM  Result Value Ref Range   Glucose-Capillary 166 (H) 70 - 99 mg/dL   Comment 1 Documented in Chart    Comment 2 Notify RN   Basic metabolic panel     Status: Abnormal   Collection Time: 03/12/14  6:00 AM  Result Value Ref Range   Sodium 140 135 - 145 mmol/L    Comment: Please note change in reference range.   Potassium 3.4 (L) 3.5 - 5.1 mmol/L    Comment: Please note change in reference range.   Chloride 110 96 - 112 mEq/L   CO2 18 (L) 19 - 32 mmol/L   Glucose, Bld 159 (H) 70 - 99 mg/dL   BUN 68 (H) 6 - 23 mg/dL   Creatinine, Ser 1.88 (H) 0.50 - 1.10 mg/dL   Calcium 8.6 8.4 - 10.5 mg/dL   GFR calc non Af Amer 25 (L) >90 mL/min   GFR calc Af Amer 29 (L) >90 mL/min    Comment: (NOTE) The eGFR has been calculated using the CKD EPI equation. This calculation has not been validated in all clinical situations. eGFR's persistently <90 mL/min signify possible Chronic Kidney Disease.    Anion gap 12 5 - 15  CBC     Status: Abnormal   Collection Time: 03/12/14  6:00 AM  Result Value Ref Range   WBC 12.2 (H) 4.0 - 10.5 K/uL   RBC 2.90 (L) 3.87 - 5.11 MIL/uL   Hemoglobin 9.2 (L) 12.0 - 15.0 g/dL   HCT 28.0 (L) 36.0 - 46.0 %   MCV 96.6 78.0 - 100.0 fL   MCH 31.7 26.0 - 34.0 pg   MCHC 32.9 30.0 - 36.0  g/dL   RDW 14.3 11.5 - 15.5 %   Platelets 194 150 - 400 K/uL  Glucose, capillary     Status: Abnormal   Collection Time: 03/12/14  8:33 AM  Result Value Ref Range   Glucose-Capillary 147 (H) 70 - 99 mg/dL  Glucose, capillary     Status: Abnormal   Collection Time: 03/12/14 12:48 PM  Result Value Ref Range   Glucose-Capillary 148 (H) 70 - 99 mg/dL  Glucose, capillary     Status: Abnormal   Collection Time: 03/12/14  5:48 PM  Result Value Ref Range   Glucose-Capillary 144 (H) 70 - 99 mg/dL  Glucose, capillary     Status: None   Collection Time: 03/12/14  9:38 PM  Result Value Ref Range   Glucose-Capillary 97 70 - 99 mg/dL  CBC     Status: Abnormal   Collection Time: 03/13/14  5:00 AM  Result Value Ref Range   WBC 14.1 (H) 4.0 - 10.5 K/uL   RBC 3.29 (L) 3.87 - 5.11 MIL/uL   Hemoglobin 10.0 (L) 12.0 - 15.0 g/dL   HCT 31.4 (L) 36.0 - 46.0 %   MCV 95.4 78.0 - 100.0 fL   MCH 30.4 26.0 - 34.0 pg   MCHC 31.8 30.0 - 36.0 g/dL   RDW 14.7 11.5 - 15.5 %   Platelets 218 150 - 400 K/uL  Basic metabolic panel     Status: Abnormal   Collection Time: 03/13/14  5:00 AM  Result Value Ref Range   Sodium  147 (H) 135 - 145 mmol/L    Comment: Please note change in reference range.   Potassium 3.8 3.5 - 5.1 mmol/L    Comment: Please note change in reference range.   Chloride 118 (H) 96 - 112 mEq/L   CO2 22 19 - 32 mmol/L   Glucose, Bld 51 (L) 70 - 99 mg/dL   BUN 52 (H) 6 - 23 mg/dL   Creatinine, Ser 1.55 (H) 0.50 - 1.10 mg/dL   Calcium 8.8 8.4 - 10.5 mg/dL   GFR calc non Af Amer 31 (L) >90 mL/min   GFR calc Af Amer 36 (L) >90 mL/min    Comment: (NOTE) The eGFR has been calculated using the CKD EPI equation. This calculation has not been validated in all clinical situations. eGFR's persistently <90 mL/min signify possible Chronic Kidney Disease.    Anion gap 7 5 - 15  Glucose, capillary     Status: Abnormal   Collection Time: 03/13/14  8:03 AM  Result Value Ref Range   Glucose-Capillary  48 (L) 70 - 99 mg/dL   Comment 1 Documented in Chart    Comment 2 Notify RN   Glucose, capillary     Status: Abnormal   Collection Time: 03/13/14 10:04 AM  Result Value Ref Range   Glucose-Capillary 154 (H) 70 - 99 mg/dL   Comment 1 Documented in Chart    Comment 2 Notify RN   Glucose, capillary     Status: Abnormal   Collection Time: 03/13/14  4:48 PM  Result Value Ref Range   Glucose-Capillary 145 (H) 70 - 99 mg/dL  Glucose, capillary     Status: None   Collection Time: 03/13/14  9:25 PM  Result Value Ref Range   Glucose-Capillary 74 70 - 99 mg/dL  Glucose, capillary     Status: Abnormal   Collection Time: 03/14/14  7:57 AM  Result Value Ref Range   Glucose-Capillary 48 (L) 70 - 99 mg/dL   Comment 1 Notify RN   Glucose, capillary     Status: Abnormal   Collection Time: 03/14/14  8:33 AM  Result Value Ref Range   Glucose-Capillary 105 (H) 70 - 99 mg/dL  Glucose, capillary     Status: Abnormal   Collection Time: 03/14/14 11:50 AM  Result Value Ref Range   Glucose-Capillary 199 (H) 70 - 99 mg/dL  Basic Metabolic Panel (BMET)     Status: Abnormal   Collection Time: 03/23/14 11:11 AM  Result Value Ref Range   Sodium 140 135 - 145 mEq/L   Potassium 4.6 3.5 - 5.1 mEq/L   Chloride 101 96 - 112 mEq/L   CO2 28 19 - 32 mEq/L   Glucose, Bld 285 (H) 70 - 99 mg/dL   BUN 44 (H) 6 - 23 mg/dL   Creatinine, Ser 1.9 (H) 0.4 - 1.2 mg/dL   Calcium 8.8 8.4 - 10.5 mg/dL   GFR 27.01 (L) >60.00 mL/min  CULTURE, URINE COMPREHENSIVE     Status: None   Collection Time: 03/24/14  3:01 PM  Result Value Ref Range   Colony Count NO GROWTH    Organism ID, Bacteria NO GROWTH   Basic Metabolic Panel (BMET)     Status: Abnormal   Collection Time: 03/30/14  2:22 PM  Result Value Ref Range   Sodium 134 (L) 135 - 145 mEq/L   Potassium 4.6 3.5 - 5.1 mEq/L   Chloride 99 96 - 112 mEq/L   CO2 29 19 - 32 mEq/L  Glucose, Bld 215 (H) 70 - 99 mg/dL   BUN 37 (H) 6 - 23 mg/dL   Creatinine, Ser 1.3 (H) 0.4  - 1.2 mg/dL   Calcium 9.1 8.4 - 10.5 mg/dL   GFR 43.66 (L) >60.00 mL/min  Hemoglobin A1C     Status: Abnormal   Collection Time: 04/07/14 12:03 PM  Result Value Ref Range   Hgb A1c MFr Bld 7.7 (H) 4.6 - 6.5 %    Comment: Glycemic Control Guidelines for People with Diabetes:Non Diabetic:  <6%Goal of Therapy: <7%Additional Action Suggested:  >8%   Basic metabolic panel     Status: Abnormal   Collection Time: 04/07/14 12:03 PM  Result Value Ref Range   Sodium 133 (L) 135 - 145 mEq/L   Potassium 4.2 3.5 - 5.1 mEq/L   Chloride 96 96 - 112 mEq/L   CO2 31 19 - 32 mEq/L   Glucose, Bld 188 (H) 70 - 99 mg/dL   BUN 56 (H) 6 - 23 mg/dL   Creatinine, Ser 1.38 (H) 0.40 - 1.20 mg/dL   Calcium 9.0 8.4 - 10.5 mg/dL   GFR 39.30 (L) >60.00 mL/min  Basic metabolic panel     Status: Abnormal   Collection Time: 04/14/14  1:45 PM  Result Value Ref Range   Sodium 137 135 - 145 mEq/L   Potassium 4.5 3.5 - 5.1 mEq/L   Chloride 102 96 - 112 mEq/L   CO2 28 19 - 32 mEq/L   Glucose, Bld 130 (H) 70 - 99 mg/dL   BUN 40 (H) 6 - 23 mg/dL   Creatinine, Ser 1.15 0.40 - 1.20 mg/dL   Calcium 9.2 8.4 - 10.5 mg/dL   GFR 48.50 (L) >60.00 mL/min  Basic Metabolic Panel (BMET)     Status: Abnormal   Collection Time: 05/08/14 11:27 AM  Result Value Ref Range   Sodium 140 135 - 145 mEq/L   Potassium 5.6 (H) 3.5 - 5.1 mEq/L   Chloride 105 96 - 112 mEq/L   CO2 30 19 - 32 mEq/L   Glucose, Bld 241 (H) 70 - 99 mg/dL   BUN 40 (H) 6 - 23 mg/dL   Creatinine, Ser 1.33 (H) 0.40 - 1.20 mg/dL   Calcium 9.8 8.4 - 10.5 mg/dL   GFR 41.00 (L) >60.00 mL/min  Basic metabolic panel     Status: Abnormal   Collection Time: 05/11/14 10:26 AM  Result Value Ref Range   Sodium 139 135 - 145 mEq/L   Potassium 5.3 (H) 3.5 - 5.1 mEq/L   Chloride 103 96 - 112 mEq/L   CO2 28 19 - 32 mEq/L   Glucose, Bld 221 (H) 70 - 99 mg/dL   BUN 35 (H) 6 - 23 mg/dL   Creatinine, Ser 1.18 0.40 - 1.20 mg/dL   Calcium 9.6 8.4 - 10.5 mg/dL   GFR 47.07 (L)  >60.00 mL/min    Assessment/Plan: CN (constipation) Resolved.  Continue supportive measures. Will check repeat BMP today to reassess potassium level.   Hyperkalemia From Milk of Magnesia usage.  Will repeat bmp today.  Continue avoidance of MOM.

## 2014-05-25 NOTE — Assessment & Plan Note (Signed)
From Milk of Magnesia usage.  Will repeat bmp today.  Continue avoidance of MOM.

## 2014-05-25 NOTE — Progress Notes (Signed)
Pre visit review using our clinic review tool, if applicable. No additional management support is needed unless otherwise documented below in the visit note. 

## 2014-05-25 NOTE — Assessment & Plan Note (Signed)
Resolved.  Continue supportive measures. Will check repeat BMP today to reassess potassium level.

## 2014-05-25 NOTE — Patient Instructions (Signed)
Please keep up diet and fluid intake.  Decrease portion sizes slightly to help with the bloating.  Use stool softeners as directed, if needed.   Stop by the lab for repeat potassium check.  If all is good, we will follow-up in 3 months.

## 2014-05-26 ENCOUNTER — Ambulatory Visit: Payer: Medicare Other | Admitting: Physician Assistant

## 2014-06-03 ENCOUNTER — Encounter: Payer: Self-pay | Admitting: Physician Assistant

## 2014-06-03 ENCOUNTER — Ambulatory Visit (INDEPENDENT_AMBULATORY_CARE_PROVIDER_SITE_OTHER): Payer: Medicare Other | Admitting: Physician Assistant

## 2014-06-03 VITALS — BP 126/78 | HR 83 | Temp 98.3°F | Resp 16 | Ht 62.0 in | Wt 106.0 lb

## 2014-06-03 DIAGNOSIS — J00 Acute nasopharyngitis [common cold]: Secondary | ICD-10-CM

## 2014-06-03 DIAGNOSIS — R062 Wheezing: Secondary | ICD-10-CM | POA: Diagnosis not present

## 2014-06-03 DIAGNOSIS — J208 Acute bronchitis due to other specified organisms: Principal | ICD-10-CM

## 2014-06-03 DIAGNOSIS — B9689 Other specified bacterial agents as the cause of diseases classified elsewhere: Secondary | ICD-10-CM

## 2014-06-03 MED ORDER — DOXYCYCLINE HYCLATE 100 MG PO CAPS: 100.0000 mg | ORAL_CAPSULE | Freq: Two times a day (BID) | ORAL | Status: DC

## 2014-06-03 MED ORDER — ALBUTEROL SULFATE (2.5 MG/3ML) 0.083% IN NEBU
2.5000 mg | INHALATION_SOLUTION | Freq: Once | RESPIRATORY_TRACT | Status: AC
  Administered 2014-06-03: 2.5 mg via RESPIRATORY_TRACT

## 2014-06-03 MED ORDER — ATORVASTATIN CALCIUM 40 MG PO TABS: 40.0000 mg | ORAL_TABLET | Freq: Every day | ORAL | Status: DC

## 2014-06-03 NOTE — Assessment & Plan Note (Signed)
Wheezing on presentation. No evidence of respiratory distress.  Improvement with ALbuterol Neb in office x 1.  Rx Doxycycline.  Mucinex. Continue Asthma medications.  Strict return precautions discussed.

## 2014-06-03 NOTE — Progress Notes (Signed)
Pre visit review using our clinic review tool, if applicable. No additional management support is needed unless otherwise documented below in the visit note/SLS  

## 2014-06-03 NOTE — Progress Notes (Signed)
Patient presents to clinic today  With her daughter complaining of one week progressively worsening productive cough, chest congestion, head congestion, fatigue ) wheezing. Patient endorses cough is productive of yellow sputum. Denies fever, chills, chest pain or shortness of breath. Denies recent travel or sick contact.  Past Medical History  Diagnosis Date  . Iron deficiency anemia, unspecified   . Chronic diastolic CHF (congestive heart failure)   . CAD  S/P CABG x 3 08/2011     LIMA to diagonal branch, SVG to OM1, SVG to PDA, EVH via bilateral thighs  . Type II or unspecified type diabetes mellitus without mention of complication, not stated as uncontrolled   . Contact dermatitis and other eczema, due to unspecified cause   . Esophageal reflux   . Headache(784.0)   . Pacemaker MDT dual chamber     DOI 08/2011  . Pure hypercholesterolemia   . hypertension   . Hypothyroidism   . Atrioventricular block, complete -intermittent   . Memory loss   . Idiopathic peripheral neuropathy   . LBBB (left bundle branch block)   . Carotid stenosis     Carotid dopplers 8/11 with 57% LICA stenosis  . Severe aortic stenosis -S/P AVR      19 mm [facility] Ease pericardial tissue valve  . Extrinsic asthma, unspecified     hasn't used inhaler in past year  . CKD (chronic kidney disease) stage 4, GFR 15-29 ml/min     Cr 1.6 in 6/11, sees Dr. Arty Baumgartner  . Impaired vision     pt. reports that she identifies her meds by looking at the pillls, she is not able to read labels on bottles    Current Outpatient Prescriptions on File Prior to Visit  Medication Sig Dispense Refill  . albuterol (PROVENTIL HFA;VENTOLIN HFA) 108 (90 BASE) MCG/ACT inhaler Inhale 2 puffs into the lungs every 6 (six) hours as needed for wheezing or shortness of breath. 1 Inhaler 0  . ammonium lactate (AMLACTIN) 12 % lotion Apply 1 application topically as needed for dry skin. 225 g 0  . aspirin 81 MG EC tablet Take 1 tablet  (81 mg total) by mouth daily. 30 tablet 3  . Blood Glucose Monitoring Suppl (ONE TOUCH ULTRA MINI) W/DEVICE KIT USE AS DIRECTED TO TEST BLOOD GLUCOSE ONCE DAILY DX: 250.00 1 each 0  . carvedilol (COREG) 25 MG tablet Take 12.5 mg by mouth 2 (two) times daily after a meal.    . Cholecalciferol (VITAMIN D-3) 1000 UNITS CAPS Take 1 capsule by mouth daily.    . clonazePAM (KLONOPIN) 0.5 MG tablet Take 1 tablet (0.5 mg total) by mouth at bedtime. 20 tablet 1  . fluticasone (FLONASE) 50 MCG/ACT nasal spray Place 2 sprays into both nostrils daily. 16 g 0  . glipiZIDE (GLUCOTROL XL) 10 MG 24 hr tablet TAKE ONE TABLET BY MOUTH IN THE MORNING 30 tablet 2  . glucose blood test strip Use as instructed to test blood glucose once daily Dx: E11.9 100 each 12  . HYDROcodone-acetaminophen (NORCO/VICODIN) 5-325 MG per tablet Take 1 tablet by mouth every 6 (six) hours as needed for moderate pain. BACK PAIN    . levothyroxine (SYNTHROID, LEVOTHROID) 50 MCG tablet Take 1 tablet (50 mcg total) by mouth daily before breakfast. 90 tablet 1   No current facility-administered medications on file prior to visit.    Allergies  Allergen Reactions  . Keflex [Cephalexin]     rash  . Metformin  REACTION: intolerant  . Promethazine Hcl     REACTION: u/k  . Diflucan [Fluconazole] Rash    Renal Failure    Family History  Problem Relation Age of Onset  . Stroke Father     died in his 62's  . Stroke Mother     died in her 19's  . Diabetes Father   . Prostate cancer Son   . Diabetes Son     #2  . Hypertension Son     #3  . Thyroid disease Neg Hx   . Heart attack Neg Hx     History   Social History  . Marital Status: Widowed    Spouse Name: N/A  . Number of Children: 3  . Years of Education: N/A   Occupational History  . Retired    Social History Main Topics  . Smoking status: Never Smoker   . Smokeless tobacco: Never Used  . Alcohol Use: No  . Drug Use: No  . Sexual Activity: No   Other  Topics Concern  . None   Social History Narrative   Widowed.  Lives with son in [place].             Review of Systems - See HPI.  All other ROS are negative.  BP 126/78 mmHg  Pulse 83  Temp(Src) 98.3 F (36.8 C) (Oral)  Resp 16  Ht '5\' 2"'  (1.575 m)  Wt 106 lb (48.081 kg)  BMI 19.38 kg/m2  SpO2 99%  Physical Exam  Constitutional: She is well-developed, well-nourished, and in no distress.  HENT:  Head: Normocephalic and atraumatic.  Right Ear: External ear normal.  Left Ear: External ear normal.  Nose: Nose normal.  Mouth/Throat: Oropharynx is clear and moist. No oropharyngeal exudate.  Tympanic membranes within normal limits bilaterally.  Eyes: Conjunctivae are normal. Pupils are equal, round, and reactive to light.  Neck: Neck supple. No thyromegaly present.  Cardiovascular: Normal rate, regular rhythm, normal heart sounds and intact distal pulses.   Pulmonary/Chest: Effort normal. She has wheezes. She has no rales. She exhibits no tenderness.  Lymphadenopathy:    She has no cervical adenopathy.  Neurological: She is alert.  Skin: Skin is warm and dry. No rash noted.  Psychiatric: Affect normal.  Vitals reviewed.   Recent Results (from the past 2160 hour(s))  Implantable device check     Status: None   Collection Time: 03/05/14  8:02 PM  Result Value Ref Range   Date Time Interrogation Session [id number]    Pulse Generator Manufacturer Medtronic    Pulse Gen Model [id number] Adapta    Pulse Gen Serial Number [id number]    RV Sense Sensitivity 5.6 mV   RA Pace Amplitude 2 V   RV Pace PulseWidth 0.4 ms   RV Pace Amplitude 2.5 V   RA Impedance 432 ohm   RA Amplitude 2 mV   RA Pacing Amplitude 0.75 V   RA Pacing PulseWidth 0.4 ms   RV IMPEDANCE 621 ohm   RV Amplitude 15.67 mV   RV Pacing Amplitude 0.5 V   RV Pacing PulseWidth 0.4 ms   Battery Status Unknown    Battery Longevity 120 mo   Battery Voltage 2.79 V   Battery Impedance 206 ohm   Brady AP  VP Percent 3 %   Brady AS VP Percent 6 %   Brady AP VS Percent 65 %   Brady AS VS Percent 26 %   Eval Rhythm Brady @ 48  Miscellaneous Comment      Pacemaker check in clinic. Normal device function. Thresholds, sensing, impedances consistent with previous measurements. Device programmed to maximize longevity. 13 mode switches all < 1 minute.  No high ventricular rates noted. Device programmed at  appropriate safety margins. Histogram distribution appropriate for patient activity level. Device programmed to optimize intrinsic conduction. Estimated longevity 10 years. Patient enrolled in remote follow-up/TTM's with [employer]. Plan to follow every 3  months remotely and see annually in office. Patient education completed.  [facility] 06/04/14.   Comprehensive metabolic panel     Status: Abnormal   Collection Time: 03/10/14  9:28 PM  Result Value Ref Range   Sodium 135 135 - 145 mmol/L    Comment: Please note change in reference range.   Potassium 4.3 3.5 - 5.1 mmol/L    Comment: Please note change in reference range.   Chloride 99 96 - 112 mEq/L   CO2 23 19 - 32 mmol/L   Glucose, Bld 222 (H) 70 - 99 mg/dL   BUN 90 (H) 6 - 23 mg/dL   Creatinine, Ser 2.49 (H) 0.50 - 1.10 mg/dL   Calcium 8.7 8.4 - 10.5 mg/dL   Total Protein 7.3 6.0 - 8.3 g/dL   Albumin 3.9 3.5 - 5.2 g/dL   AST 22 0 - 37 U/L   ALT 9 0 - 35 U/L   Alkaline Phosphatase 96 39 - 117 U/L   Total Bilirubin 0.5 0.3 - 1.2 mg/dL   GFR calc non Af Amer 18 (L) >90 mL/min   GFR calc Af Amer 20 (L) >90 mL/min    Comment: (NOTE) The eGFR has been calculated using the CKD EPI equation. This calculation has not been validated in all clinical situations. eGFR's persistently <90 mL/min signify possible Chronic Kidney Disease.    Anion gap 13 5 - 15  CBC     Status: Abnormal   Collection Time: 03/10/14 10:08 PM  Result Value Ref Range   WBC 10.3 4.0 - 10.5 K/uL   RBC 3.47 (L) 3.87 - 5.11 MIL/uL   Hemoglobin 11.0 (L) 12.0 - 15.0 g/dL    HCT 33.3 (L) 36.0 - 46.0 %   MCV 96.0 78.0 - 100.0 fL   MCH 31.7 26.0 - 34.0 pg   MCHC 33.0 30.0 - 36.0 g/dL   RDW 13.6 11.5 - 15.5 %   Platelets 200 150 - 400 K/uL  Differential     Status: Abnormal   Collection Time: 03/10/14 10:08 PM  Result Value Ref Range   Neutrophils Relative % 63 43 - 77 %   Neutro Abs 6.5 1.7 - 7.7 K/uL   Lymphocytes Relative 16 12 - 46 %   Lymphs Abs 1.6 0.7 - 4.0 K/uL   Monocytes Relative 7 3 - 12 %   Monocytes Absolute 0.7 0.1 - 1.0 K/uL   Eosinophils Relative 14 (H) 0 - 5 %   Eosinophils Absolute 1.5 (H) 0.0 - 0.7 K/uL   Basophils Relative 0 0 - 1 %   Basophils Absolute 0.0 0.0 - 0.1 K/uL  Urinalysis, Routine w reflex microscopic     Status: Abnormal   Collection Time: 03/10/14 11:07 PM  Result Value Ref Range   Color, Urine YELLOW YELLOW   APPearance CLEAR CLEAR   Specific Gravity, Urine 1.013 1.005 - 1.030   pH 5.0 5.0 - 8.0   Glucose, UA NEGATIVE NEGATIVE mg/dL   Hgb urine dipstick NEGATIVE NEGATIVE   Bilirubin Urine NEGATIVE NEGATIVE   Ketones,  ur NEGATIVE NEGATIVE mg/dL   Protein, ur NEGATIVE NEGATIVE mg/dL   Urobilinogen, UA 0.2 0.0 - 1.0 mg/dL   Nitrite NEGATIVE NEGATIVE   Leukocytes, UA SMALL (A) NEGATIVE  Urine microscopic-add on     Status: Abnormal   Collection Time: 03/10/14 11:07 PM  Result Value Ref Range   Squamous Epithelial / LPF RARE RARE   WBC, UA 11-20 <3 WBC/hpf   RBC / HPF 0-2 <3 RBC/hpf   Bacteria, UA RARE RARE   Casts HYALINE CASTS (A) NEGATIVE  Urine culture     Status: None   Collection Time: 03/10/14 11:08 PM  Result Value Ref Range   Specimen Description URINE, CLEAN CATCH    Special Requests NONE    Culture  Setup Time      03/11/2014 08:25 Performed at [place]      5,000 COLONIES/ML Performed at [facility]    Culture      INSIGNIFICANT GROWTH Performed at [facility]    Report Status 03/12/2014 FINAL   Comprehensive metabolic panel     Status:  Abnormal   Collection Time: 03/11/14  5:30 AM  Result Value Ref Range   Sodium 135 135 - 145 mmol/L    Comment: Please note change in reference range.   Potassium 4.0 3.5 - 5.1 mmol/L    Comment: Please note change in reference range.   Chloride 101 96 - 112 mEq/L   CO2 18 (L) 19 - 32 mmol/L   Glucose, Bld 188 (H) 70 - 99 mg/dL   BUN 83 (H) 6 - 23 mg/dL   Creatinine, Ser 2.40 (H) 0.50 - 1.10 mg/dL   Calcium 8.4 8.4 - 10.5 mg/dL   Total Protein 6.4 6.0 - 8.3 g/dL   Albumin 3.4 (L) 3.5 - 5.2 g/dL   AST 22 0 - 37 U/L   ALT 12 0 - 35 U/L   Alkaline Phosphatase 91 39 - 117 U/L   Total Bilirubin 0.1 (L) 0.3 - 1.2 mg/dL   GFR calc non Af Amer 18 (L) >90 mL/min   GFR calc Af Amer 21 (L) >90 mL/min    Comment: (NOTE) The eGFR has been calculated using the CKD EPI equation. This calculation has not been validated in all clinical situations. eGFR's persistently <90 mL/min signify possible Chronic Kidney Disease.    Anion gap 16 (H) 5 - 15  CBC with Differential     Status: Abnormal   Collection Time: 03/11/14  5:30 AM  Result Value Ref Range   WBC 9.6 4.0 - 10.5 K/uL   RBC 3.42 (L) 3.87 - 5.11 MIL/uL   Hemoglobin 10.5 (L) 12.0 - 15.0 g/dL   HCT 32.2 (L) 36.0 - 46.0 %   MCV 94.2 78.0 - 100.0 fL   MCH 30.7 26.0 - 34.0 pg   MCHC 32.6 30.0 - 36.0 g/dL   RDW 14.1 11.5 - 15.5 %   Platelets 210 150 - 400 K/uL   Neutrophils Relative % 86 (H) 43 - 77 %   Neutro Abs 8.3 (H) 1.7 - 7.7 K/uL   Lymphocytes Relative 10 (L) 12 - 46 %   Lymphs Abs 1.0 0.7 - 4.0 K/uL   Monocytes Relative 1 (L) 3 - 12 %   Monocytes Absolute 0.1 0.1 - 1.0 K/uL   Eosinophils Relative 3 0 - 5 %   Eosinophils Absolute 0.3 0.0 - 0.7 K/uL   Basophils Relative 0 0 - 1 %  Basophils Absolute 0.0 0.0 - 0.1 K/uL  Sedimentation rate     Status: Abnormal   Collection Time: 03/11/14  5:30 AM  Result Value Ref Range   Sed Rate 33 (H) 0 - 22 mm/hr  C-reactive protein     Status: Abnormal   Collection Time: 03/11/14  5:30  AM  Result Value Ref Range   CRP 1.4 (H) <0.60 mg/dL    Comment: Performed at [facility]  Glucose, capillary     Status: Abnormal   Collection Time: 03/11/14  8:07 AM  Result Value Ref Range   Glucose-Capillary 211 (H) 70 - 99 mg/dL  Glucose, capillary     Status: Abnormal   Collection Time: 03/11/14 12:04 PM  Result Value Ref Range   Glucose-Capillary 166 (H) 70 - 99 mg/dL  Salicylate level     Status: None   Collection Time: 03/11/14  2:03 PM  Result Value Ref Range   Salicylate Lvl <2.5 2.8 - 20.0 mg/dL  Glucose, capillary     Status: Abnormal   Collection Time: 03/11/14  4:43 PM  Result Value Ref Range   Glucose-Capillary 174 (H) 70 - 99 mg/dL  Glucose, capillary     Status: Abnormal   Collection Time: 03/11/14  9:28 PM  Result Value Ref Range   Glucose-Capillary 166 (H) 70 - 99 mg/dL   Comment 1 Documented in Chart    Comment 2 Notify RN   Basic metabolic panel     Status: Abnormal   Collection Time: 03/12/14  6:00 AM  Result Value Ref Range   Sodium 140 135 - 145 mmol/L    Comment: Please note change in reference range.   Potassium 3.4 (L) 3.5 - 5.1 mmol/L    Comment: Please note change in reference range.   Chloride 110 96 - 112 mEq/L   CO2 18 (L) 19 - 32 mmol/L   Glucose, Bld 159 (H) 70 - 99 mg/dL   BUN 68 (H) 6 - 23 mg/dL   Creatinine, Ser 1.88 (H) 0.50 - 1.10 mg/dL   Calcium 8.6 8.4 - 10.5 mg/dL   GFR calc non Af Amer 25 (L) >90 mL/min   GFR calc Af Amer 29 (L) >90 mL/min    Comment: (NOTE) The eGFR has been calculated using the CKD EPI equation. This calculation has not been validated in all clinical situations. eGFR's persistently <90 mL/min signify possible Chronic Kidney Disease.    Anion gap 12 5 - 15  CBC     Status: Abnormal   Collection Time: 03/12/14  6:00 AM  Result Value Ref Range   WBC 12.2 (H) 4.0 - 10.5 K/uL   RBC 2.90 (L) 3.87 - 5.11 MIL/uL   Hemoglobin 9.2 (L) 12.0 - 15.0 g/dL   HCT 28.0 (L) 36.0 - 46.0 %   MCV 96.6 78.0 -  100.0 fL   MCH 31.7 26.0 - 34.0 pg   MCHC 32.9 30.0 - 36.0 g/dL   RDW 14.3 11.5 - 15.5 %   Platelets 194 150 - 400 K/uL  Glucose, capillary     Status: Abnormal   Collection Time: 03/12/14  8:33 AM  Result Value Ref Range   Glucose-Capillary 147 (H) 70 - 99 mg/dL  Glucose, capillary     Status: Abnormal   Collection Time: 03/12/14 12:48 PM  Result Value Ref Range   Glucose-Capillary 148 (H) 70 - 99 mg/dL  Glucose, capillary     Status: Abnormal   Collection Time: 03/12/14  5:48  PM  Result Value Ref Range   Glucose-Capillary 144 (H) 70 - 99 mg/dL  Glucose, capillary     Status: None   Collection Time: 03/12/14  9:38 PM  Result Value Ref Range   Glucose-Capillary 97 70 - 99 mg/dL  CBC     Status: Abnormal   Collection Time: 03/13/14  5:00 AM  Result Value Ref Range   WBC 14.1 (H) 4.0 - 10.5 K/uL   RBC 3.29 (L) 3.87 - 5.11 MIL/uL   Hemoglobin 10.0 (L) 12.0 - 15.0 g/dL   HCT 31.4 (L) 36.0 - 46.0 %   MCV 95.4 78.0 - 100.0 fL   MCH 30.4 26.0 - 34.0 pg   MCHC 31.8 30.0 - 36.0 g/dL   RDW 14.7 11.5 - 15.5 %   Platelets 218 150 - 400 K/uL  Basic metabolic panel     Status: Abnormal   Collection Time: 03/13/14  5:00 AM  Result Value Ref Range   Sodium 147 (H) 135 - 145 mmol/L    Comment: Please note change in reference range.   Potassium 3.8 3.5 - 5.1 mmol/L    Comment: Please note change in reference range.   Chloride 118 (H) 96 - 112 mEq/L   CO2 22 19 - 32 mmol/L   Glucose, Bld 51 (L) 70 - 99 mg/dL   BUN 52 (H) 6 - 23 mg/dL   Creatinine, Ser 1.55 (H) 0.50 - 1.10 mg/dL   Calcium 8.8 8.4 - 10.5 mg/dL   GFR calc non Af Amer 31 (L) >90 mL/min   GFR calc Af Amer 36 (L) >90 mL/min    Comment: (NOTE) The eGFR has been calculated using the CKD EPI equation. This calculation has not been validated in all clinical situations. eGFR's persistently <90 mL/min signify possible Chronic Kidney Disease.    Anion gap 7 5 - 15  Glucose, capillary     Status: Abnormal   Collection Time:  03/13/14  8:03 AM  Result Value Ref Range   Glucose-Capillary 48 (L) 70 - 99 mg/dL   Comment 1 Documented in Chart    Comment 2 Notify RN   Glucose, capillary     Status: Abnormal   Collection Time: 03/13/14 10:04 AM  Result Value Ref Range   Glucose-Capillary 154 (H) 70 - 99 mg/dL   Comment 1 Documented in Chart    Comment 2 Notify RN   Glucose, capillary     Status: Abnormal   Collection Time: 03/13/14  4:48 PM  Result Value Ref Range   Glucose-Capillary 145 (H) 70 - 99 mg/dL  Glucose, capillary     Status: None   Collection Time: 03/13/14  9:25 PM  Result Value Ref Range   Glucose-Capillary 74 70 - 99 mg/dL  Glucose, capillary     Status: Abnormal   Collection Time: 03/14/14  7:57 AM  Result Value Ref Range   Glucose-Capillary 48 (L) 70 - 99 mg/dL   Comment 1 Notify RN   Glucose, capillary     Status: Abnormal   Collection Time: 03/14/14  8:33 AM  Result Value Ref Range   Glucose-Capillary 105 (H) 70 - 99 mg/dL  Glucose, capillary     Status: Abnormal   Collection Time: 03/14/14 11:50 AM  Result Value Ref Range   Glucose-Capillary 199 (H) 70 - 99 mg/dL  Basic Metabolic Panel (BMET)     Status: Abnormal   Collection Time: 03/23/14 11:11 AM  Result Value Ref Range   Sodium 140 135 -  145 mEq/L   Potassium 4.6 3.5 - 5.1 mEq/L   Chloride 101 96 - 112 mEq/L   CO2 28 19 - 32 mEq/L   Glucose, Bld 285 (H) 70 - 99 mg/dL   BUN 44 (H) 6 - 23 mg/dL   Creatinine, Ser 1.9 (H) 0.4 - 1.2 mg/dL   Calcium 8.8 8.4 - 10.5 mg/dL   GFR 27.01 (L) >60.00 mL/min  CULTURE, URINE COMPREHENSIVE     Status: None   Collection Time: 03/24/14  3:01 PM  Result Value Ref Range   Colony Count NO GROWTH    Organism ID, Bacteria NO GROWTH   Basic Metabolic Panel (BMET)     Status: Abnormal   Collection Time: 03/30/14  2:22 PM  Result Value Ref Range   Sodium 134 (L) 135 - 145 mEq/L   Potassium 4.6 3.5 - 5.1 mEq/L   Chloride 99 96 - 112 mEq/L   CO2 29 19 - 32 mEq/L   Glucose, Bld 215 (H) 70 - 99  mg/dL   BUN 37 (H) 6 - 23 mg/dL   Creatinine, Ser 1.3 (H) 0.4 - 1.2 mg/dL   Calcium 9.1 8.4 - 10.5 mg/dL   GFR 43.66 (L) >60.00 mL/min  Hemoglobin A1C     Status: Abnormal   Collection Time: 04/07/14 12:03 PM  Result Value Ref Range   Hgb A1c MFr Bld 7.7 (H) 4.6 - 6.5 %    Comment: Glycemic Control Guidelines for People with Diabetes:Non Diabetic:  <6%Goal of Therapy: <7%Additional Action Suggested:  >8%   Basic metabolic panel     Status: Abnormal   Collection Time: 04/07/14 12:03 PM  Result Value Ref Range   Sodium 133 (L) 135 - 145 mEq/L   Potassium 4.2 3.5 - 5.1 mEq/L   Chloride 96 96 - 112 mEq/L   CO2 31 19 - 32 mEq/L   Glucose, Bld 188 (H) 70 - 99 mg/dL   BUN 56 (H) 6 - 23 mg/dL   Creatinine, Ser 1.38 (H) 0.40 - 1.20 mg/dL   Calcium 9.0 8.4 - 10.5 mg/dL   GFR 39.30 (L) >60.00 mL/min  Basic metabolic panel     Status: Abnormal   Collection Time: 04/14/14  1:45 PM  Result Value Ref Range   Sodium 137 135 - 145 mEq/L   Potassium 4.5 3.5 - 5.1 mEq/L   Chloride 102 96 - 112 mEq/L   CO2 28 19 - 32 mEq/L   Glucose, Bld 130 (H) 70 - 99 mg/dL   BUN 40 (H) 6 - 23 mg/dL   Creatinine, Ser 1.15 0.40 - 1.20 mg/dL   Calcium 9.2 8.4 - 10.5 mg/dL   GFR 48.50 (L) >60.00 mL/min  Basic Metabolic Panel (BMET)     Status: Abnormal   Collection Time: 05/08/14 11:27 AM  Result Value Ref Range   Sodium 140 135 - 145 mEq/L   Potassium 5.6 (H) 3.5 - 5.1 mEq/L   Chloride 105 96 - 112 mEq/L   CO2 30 19 - 32 mEq/L   Glucose, Bld 241 (H) 70 - 99 mg/dL   BUN 40 (H) 6 - 23 mg/dL   Creatinine, Ser 1.33 (H) 0.40 - 1.20 mg/dL   Calcium 9.8 8.4 - 10.5 mg/dL   GFR 41.00 (L) >60.00 mL/min  Basic metabolic panel     Status: Abnormal   Collection Time: 05/11/14 10:26 AM  Result Value Ref Range   Sodium 139 135 - 145 mEq/L   Potassium 5.3 (H) 3.5 - 5.1  mEq/L   Chloride 103 96 - 112 mEq/L   CO2 28 19 - 32 mEq/L   Glucose, Bld 221 (H) 70 - 99 mg/dL   BUN 35 (H) 6 - 23 mg/dL   Creatinine, Ser 1.18  0.40 - 1.20 mg/dL   Calcium 9.6 8.4 - 10.5 mg/dL   GFR 47.07 (L) >60.00 mL/min  Basic Metabolic Panel (BMET)     Status: Abnormal   Collection Time: 05/25/14 11:07 AM  Result Value Ref Range   Sodium 137 135 - 145 mEq/L   Potassium 4.7 3.5 - 5.1 mEq/L   Chloride 104 96 - 112 mEq/L   CO2 28 19 - 32 mEq/L   Glucose, Bld 213 (H) 70 - 99 mg/dL   BUN 44 (H) 6 - 23 mg/dL   Creatinine, Ser 1.37 (H) 0.40 - 1.20 mg/dL   Calcium 9.2 8.4 - 10.5 mg/dL   GFR 39.62 (L) >60.00 mL/min    Assessment/Plan: Acute bacterial bronchitis Wheezing on presentation. No evidence of respiratory distress.  Improvement with ALbuterol Neb in office x 1.  Rx Doxycycline.  Mucinex. Continue Asthma medications.  Strict return precautions discussed.

## 2014-06-03 NOTE — Patient Instructions (Signed)
Please take the doxycycline as directed. Stay well hydrated. Continue albuterol inhaler as directed Plain Mucinex for congestion. Place humidifier in bedroom. Call or return to clinic if symptoms are not improving.

## 2014-06-04 ENCOUNTER — Encounter: Payer: Medicare Other | Admitting: *Deleted

## 2014-06-04 ENCOUNTER — Telehealth: Payer: Self-pay | Admitting: Cardiology

## 2014-06-04 NOTE — Telephone Encounter (Signed)
Confirmed remote transmission w/ pt son.    

## 2014-06-05 ENCOUNTER — Encounter: Payer: Self-pay | Admitting: Cardiology

## 2014-06-05 ENCOUNTER — Ambulatory Visit: Payer: Medicare Other | Admitting: Physician Assistant

## 2014-06-16 ENCOUNTER — Encounter: Payer: Self-pay | Admitting: Physician Assistant

## 2014-06-16 ENCOUNTER — Ambulatory Visit (INDEPENDENT_AMBULATORY_CARE_PROVIDER_SITE_OTHER): Payer: Medicare Other | Admitting: Physician Assistant

## 2014-06-16 ENCOUNTER — Ambulatory Visit (HOSPITAL_BASED_OUTPATIENT_CLINIC_OR_DEPARTMENT_OTHER)
Admission: RE | Admit: 2014-06-16 | Discharge: 2014-06-16 | Disposition: A | Discharge status: Home / Self Care | Payer: Medicare Other | Source: Ambulatory Visit | Attending: Physician Assistant | Admitting: Physician Assistant

## 2014-06-16 ENCOUNTER — Telehealth: Payer: Self-pay | Admitting: Physician Assistant

## 2014-06-16 VITALS — BP 130/68 | HR 73 | Temp 97.5°F | Resp 14 | Ht 62.0 in | Wt 106.5 lb

## 2014-06-16 DIAGNOSIS — K5900 Constipation, unspecified: Secondary | ICD-10-CM

## 2014-06-16 DIAGNOSIS — R1915 Other abnormal bowel sounds: Secondary | ICD-10-CM | POA: Diagnosis not present

## 2014-06-16 MED ORDER — POLYETHYLENE GLYCOL 3350 17 GM/SCOOP PO POWD: 17.0000 g | Freq: Every day | ORAL | Status: DC

## 2014-06-16 NOTE — Telephone Encounter (Signed)
error:[id number] ° °

## 2014-06-16 NOTE — Patient Instructions (Signed)
Please go downstairs for an x-ray. Continue good hydration. Continue stool softener. Take MiraLAX daily over the next few days as directed. If still no bowel movement, use a Fleet enema, following the package instructions. If still no bowel movement, please proceed to the ER.

## 2014-06-17 ENCOUNTER — Other Ambulatory Visit: Payer: Self-pay | Admitting: *Deleted

## 2014-06-17 DIAGNOSIS — K5900 Constipation, unspecified: Secondary | ICD-10-CM

## 2014-06-17 MED ORDER — POLYETHYLENE GLYCOL 3350 17 GM/SCOOP PO POWD: 17.0000 g | Freq: Every day | ORAL | Status: DC

## 2014-06-17 NOTE — Progress Notes (Signed)
Per pharmacy request, resent with change in Dispense amount/SLS

## 2014-06-19 NOTE — Assessment & Plan Note (Signed)
With slightly decreased bowel sounds. Will obtain a stat x-ray of the abdomen to rule out a developing small bowel obstruction area encourage continue fiber supplementation. Increase fluid intake. We'll Rx MiraLAX to use over the next few days. Follow-up in one week or sooner if symptoms are not improving.

## 2014-06-19 NOTE — Progress Notes (Signed)
Patient presents to clinic today c/o constipation over the past week. Endorses last bowel movement was a few days ago. Endorses some bloating and increased flatulence. Denies nausea or vomiting denies painful bowel movement, but endorses hard stool. Denies hematochezia or melena. Is trying to increase her fiber intake but is not drinking much during the day.  Past Medical History  Diagnosis Date  . Iron deficiency anemia, unspecified   . Chronic diastolic CHF (congestive heart failure)   . CAD  S/P CABG x 3 08/2011     LIMA to diagonal branch, SVG to OM1, SVG to PDA, EVH via bilateral thighs  . Type II or unspecified type diabetes mellitus without mention of complication, not stated as uncontrolled   . Contact dermatitis and other eczema, due to unspecified cause   . Esophageal reflux   . Headache(784.0)   . Pacemaker MDT dual chamber     DOI 08/2011  . Pure hypercholesterolemia   . hypertension   . Hypothyroidism   . Atrioventricular block, complete -intermittent   . Memory loss   . Idiopathic peripheral neuropathy   . LBBB (left bundle branch block)   . Carotid stenosis     Carotid dopplers 1/77 with 93% LICA stenosis  . Severe aortic stenosis -S/P AVR      19 mm [facility] Ease pericardial tissue valve  . Extrinsic asthma, unspecified     hasn't used inhaler in past year  . CKD (chronic kidney disease) stage 4, GFR 15-29 ml/min     Cr 1.6 in 6/11, sees Dr. Arty Baumgartner  . Impaired vision     pt. reports that she identifies her meds by looking at the pillls, she is not able to read labels on bottles    Current Outpatient Prescriptions on File Prior to Visit  Medication Sig Dispense Refill  . albuterol (PROVENTIL HFA;VENTOLIN HFA) 108 (90 BASE) MCG/ACT inhaler Inhale 2 puffs into the lungs every 6 (six) hours as needed for wheezing or shortness of breath. 1 Inhaler 0  . ammonium lactate (AMLACTIN) 12 % lotion Apply 1 application topically as needed for dry skin. 225 g 0  .  aspirin 81 MG EC tablet Take 1 tablet (81 mg total) by mouth daily. 30 tablet 3  . atorvastatin (LIPITOR) 40 MG tablet Take 1 tablet (40 mg total) by mouth daily. 30 tablet 2  . Blood Glucose Monitoring Suppl (ONE TOUCH ULTRA MINI) W/DEVICE KIT USE AS DIRECTED TO TEST BLOOD GLUCOSE ONCE DAILY DX: 250.00 1 each 0  . carvedilol (COREG) 25 MG tablet Take 12.5 mg by mouth 2 (two) times daily after a meal.    . Cholecalciferol (VITAMIN D-3) 1000 UNITS CAPS Take 1 capsule by mouth daily.    . clonazePAM (KLONOPIN) 0.5 MG tablet Take 1 tablet (0.5 mg total) by mouth at bedtime. 20 tablet 1  . fluticasone (FLONASE) 50 MCG/ACT nasal spray Place 2 sprays into both nostrils daily. 16 g 0  . glipiZIDE (GLUCOTROL XL) 10 MG 24 hr tablet TAKE ONE TABLET BY MOUTH IN THE MORNING 30 tablet 2  . glucose blood test strip Use as instructed to test blood glucose once daily Dx: E11.9 100 each 12  . HYDROcodone-acetaminophen (NORCO/VICODIN) 5-325 MG per tablet Take 1 tablet by mouth every 6 (six) hours as needed for moderate pain. BACK PAIN    . levothyroxine (SYNTHROID, LEVOTHROID) 50 MCG tablet Take 1 tablet (50 mcg total) by mouth daily before breakfast. 90 tablet 1   No  current facility-administered medications on file prior to visit.    Allergies  Allergen Reactions  . Keflex [Cephalexin]     rash  . Metformin     REACTION: intolerant  . Promethazine Hcl     REACTION: u/k  . Diflucan [Fluconazole] Rash    Renal Failure    Family History  Problem Relation Age of Onset  . Stroke Father     died in his 102's  . Stroke Mother     died in her 30's  . Diabetes Father   . Prostate cancer Son   . Diabetes Son     #2  . Hypertension Son     #3  . Thyroid disease Neg Hx   . Heart attack Neg Hx     History   Social History  . Marital Status: Widowed    Spouse Name: N/A  . Number of Children: 3  . Years of Education: N/A   Occupational History  . Retired    Social History Main Topics  .  Smoking status: Never Smoker   . Smokeless tobacco: Never Used  . Alcohol Use: No  . Drug Use: No  . Sexual Activity: No   Other Topics Concern  . None   Social History Narrative   Widowed.  Lives with son in [place].             Review of Systems - See HPI.  All other ROS are negative.  BP 130/68 mmHg  Pulse 73  Temp(Src) 97.5 F (36.4 C) (Oral)  Resp 14  Ht _0  (1.575 m)  Wt 106 lb 8 oz (48.308 kg)  BMI 19.47 kg/m2  SpO2 98%  Physical Exam  Constitutional: She is oriented to person, place, and time and well-developed, well-nourished, and in no distress.  HENT:  Head: Normocephalic and atraumatic.  Cardiovascular: Normal rate, regular rhythm, normal heart sounds and intact distal pulses.   Pulmonary/Chest: Effort normal and breath sounds normal. No respiratory distress. She has no wheezes. She has no rales. She exhibits no tenderness.  Abdominal: Soft. She exhibits no distension and no mass. There is no rebound and no guarding.  Mild tenderness that is diffuse. Only noted on deep palpation. No rebound or guarding. Slightly hypoactive bowel sounds noted.  Neurological: She is alert and oriented to person, place, and time.  Skin: Skin is warm and dry. No rash noted.  Psychiatric: Affect normal.  Vitals reviewed.   Recent Results (from the past 2160 hour(s))  Basic Metabolic Panel (BMET)     Status: Abnormal   Collection Time: 03/23/14 11:11 AM  Result Value Ref Range   Sodium 140 135 - 145 mEq/L   Potassium 4.6 3.5 - 5.1 mEq/L   Chloride 101 96 - 112 mEq/L   CO2 28 19 - 32 mEq/L   Glucose, Bld 285 (H) 70 - 99 mg/dL   BUN 44 (H) 6 - 23 mg/dL   Creatinine, Ser 1.9 (H) 0.4 - 1.2 mg/dL   Calcium 8.8 8.4 - 10.5 mg/dL   GFR 27.01 (L) >60.00 mL/min  CULTURE, URINE COMPREHENSIVE     Status: None   Collection Time: 03/24/14  3:01 PM  Result Value Ref Range   Colony Count NO GROWTH    Organism ID, Bacteria NO GROWTH   Basic Metabolic Panel (BMET)     Status:  Abnormal   Collection Time: 03/30/14  2:22 PM  Result Value Ref Range   Sodium 134 (L) 135 - 145 mEq/L  Potassium 4.6 3.5 - 5.1 mEq/L   Chloride 99 96 - 112 mEq/L   CO2 29 19 - 32 mEq/L   Glucose, Bld 215 (H) 70 - 99 mg/dL   BUN 37 (H) 6 - 23 mg/dL   Creatinine, Ser 1.3 (H) 0.4 - 1.2 mg/dL   Calcium 9.1 8.4 - 10.5 mg/dL   GFR 43.66 (L) >60.00 mL/min  Hemoglobin A1C     Status: Abnormal   Collection Time: 04/07/14 12:03 PM  Result Value Ref Range   Hgb A1c MFr Bld 7.7 (H) 4.6 - 6.5 %    Comment: Glycemic Control Guidelines for People with Diabetes:Non Diabetic:  <6%Goal of Therapy: <7%Additional Action Suggested:  >5%   Basic metabolic panel     Status: Abnormal   Collection Time: 04/07/14 12:03 PM  Result Value Ref Range   Sodium 133 (L) 135 - 145 mEq/L   Potassium 4.2 3.5 - 5.1 mEq/L   Chloride 96 96 - 112 mEq/L   CO2 31 19 - 32 mEq/L   Glucose, Bld 188 (H) 70 - 99 mg/dL   BUN 56 (H) 6 - 23 mg/dL   Creatinine, Ser 1.38 (H) 0.40 - 1.20 mg/dL   Calcium 9.0 8.4 - 10.5 mg/dL   GFR 39.30 (L) >60.00 mL/min  Basic metabolic panel     Status: Abnormal   Collection Time: 04/14/14  1:45 PM  Result Value Ref Range   Sodium 137 135 - 145 mEq/L   Potassium 4.5 3.5 - 5.1 mEq/L   Chloride 102 96 - 112 mEq/L   CO2 28 19 - 32 mEq/L   Glucose, Bld 130 (H) 70 - 99 mg/dL   BUN 40 (H) 6 - 23 mg/dL   Creatinine, Ser 1.15 0.40 - 1.20 mg/dL   Calcium 9.2 8.4 - 10.5 mg/dL   GFR 48.50 (L) >60.00 mL/min  Basic Metabolic Panel (BMET)     Status: Abnormal   Collection Time: 05/08/14 11:27 AM  Result Value Ref Range   Sodium 140 135 - 145 mEq/L   Potassium 5.6 (H) 3.5 - 5.1 mEq/L   Chloride 105 96 - 112 mEq/L   CO2 30 19 - 32 mEq/L   Glucose, Bld 241 (H) 70 - 99 mg/dL   BUN 40 (H) 6 - 23 mg/dL   Creatinine, Ser 1.33 (H) 0.40 - 1.20 mg/dL   Calcium 9.8 8.4 - 10.5 mg/dL   GFR 41.00 (L) >60.00 mL/min  Basic metabolic panel     Status: Abnormal   Collection Time: 05/11/14 10:26 AM  Result  Value Ref Range   Sodium 139 135 - 145 mEq/L   Potassium 5.3 (H) 3.5 - 5.1 mEq/L   Chloride 103 96 - 112 mEq/L   CO2 28 19 - 32 mEq/L   Glucose, Bld 221 (H) 70 - 99 mg/dL   BUN 35 (H) 6 - 23 mg/dL   Creatinine, Ser 1.18 0.40 - 1.20 mg/dL   Calcium 9.6 8.4 - 10.5 mg/dL   GFR 47.07 (L) >60.00 mL/min  Basic Metabolic Panel (BMET)     Status: Abnormal   Collection Time: 05/25/14 11:07 AM  Result Value Ref Range   Sodium 137 135 - 145 mEq/L   Potassium 4.7 3.5 - 5.1 mEq/L   Chloride 104 96 - 112 mEq/L   CO2 28 19 - 32 mEq/L   Glucose, Bld 213 (H) 70 - 99 mg/dL   BUN 44 (H) 6 - 23 mg/dL   Creatinine, Ser 1.37 (H) 0.40 - 1.20  mg/dL   Calcium 9.2 8.4 - 10.5 mg/dL   GFR 39.62 (L) >60.00 mL/min    Assessment/Plan: CN (constipation) With slightly decreased bowel sounds. Will obtain a stat x-ray of the abdomen to rule out a developing small bowel obstruction area encourage continue fiber supplementation. Increase fluid intake. We'll Rx MiraLAX to use over the next few days. Follow-up in one week or sooner if symptoms are not improving.

## 2014-06-19 NOTE — Addendum Note (Signed)
Addended by: Raiford Noble on: 06/19/2014 12:50 PM   Modules accepted: Level of Service

## 2014-07-01 ENCOUNTER — Other Ambulatory Visit: Payer: Self-pay | Admitting: Physician Assistant

## 2014-08-09 ENCOUNTER — Other Ambulatory Visit: Payer: Self-pay | Admitting: Physician Assistant

## 2014-08-25 ENCOUNTER — Ambulatory Visit (INDEPENDENT_AMBULATORY_CARE_PROVIDER_SITE_OTHER): Payer: Medicare Other | Admitting: Physician Assistant

## 2014-08-25 ENCOUNTER — Encounter: Payer: Self-pay | Admitting: Physician Assistant

## 2014-08-25 VITALS — BP 118/76 | HR 76 | Resp 14 | Ht 62.0 in | Wt 105.4 lb

## 2014-08-25 DIAGNOSIS — E119 Type 2 diabetes mellitus without complications: Secondary | ICD-10-CM

## 2014-08-25 DIAGNOSIS — M25562 Pain in left knee: Secondary | ICD-10-CM | POA: Insufficient documentation

## 2014-08-25 DIAGNOSIS — K5900 Constipation, unspecified: Secondary | ICD-10-CM

## 2014-08-25 LAB — BASIC METABOLIC PANEL
BUN: 41 mg/dL — ABNORMAL HIGH (ref 6–23)
CALCIUM: 9.1 mg/dL (ref 8.4–10.5)
CO2: 26 mEq/L (ref 19–32)
CREATININE: 1.42 mg/dL — AB (ref 0.40–1.20)
Chloride: 103 mEq/L (ref 96–112)
GFR: 37.99 mL/min — ABNORMAL LOW (ref >60.00)
Glucose, Bld: 197 mg/dL — ABNORMAL HIGH (ref 70–99)
POTASSIUM: 4.8 meq/L (ref 3.5–5.1)
Sodium: 136 mEq/L (ref 135–145)

## 2014-08-25 LAB — HEMOGLOBIN A1C: Hgb A1c MFr Bld: 6.8 % — ABNORMAL HIGH (ref 4.6–6.5)

## 2014-08-25 NOTE — Assessment & Plan Note (Signed)
Doing well.  Continue current regimen.  Will check BMP today to reassess potassium levels as patient using laxative occasionally.

## 2014-08-25 NOTE — Patient Instructions (Signed)
Please continue your current medication and constipation regimen. Please stop by the lab for blood work. I will call you with your results. For the knee -- apply Icy hot or Bengay to the knee before periods of walking.  Let me know if symptoms worsen.

## 2014-08-25 NOTE — Progress Notes (Signed)
Patient presents to clinic today c/o 3 months follow-up of constipation. Patient and daughter-in-law patient has been doing well with daily prunes and fiber supplement. Uses miralax occasionally but is overall having daily, non-painful BM.  Endorses good hydration and well-balanced diet.  Patient endorses left knee pain that is infrequent but is noted after prolonged standing.  Denies trauma or injury.  Denies swelling, bruising, weakness, numbness or tingling.  Family members endorse patient with long-standing history of left knee pain but that their mother never complains about this.  Was previously diagnosed with arthritis of the knee.  Past Medical History  Diagnosis Date  . Iron deficiency anemia, unspecified   . Chronic diastolic CHF (congestive heart failure)   . CAD  S/P CABG x 3 08/2011     LIMA to diagonal branch, SVG to OM1, SVG to PDA, EVH via bilateral thighs  . Type II or unspecified type diabetes mellitus without mention of complication, not stated as uncontrolled   . Contact dermatitis and other eczema, due to unspecified cause   . Esophageal reflux   . Headache(784.0)   . Pacemaker MDT dual chamber     DOI 08/2011  . Pure hypercholesterolemia   . hypertension   . Hypothyroidism   . Atrioventricular block, complete -intermittent   . Memory loss   . Idiopathic peripheral neuropathy   . LBBB (left bundle branch block)   . Carotid stenosis     Carotid dopplers 0/26 with 37% LICA stenosis  . Severe aortic stenosis -S/P AVR      19 mm [facility] Ease pericardial tissue valve  . Extrinsic asthma, unspecified     hasn't used inhaler in past year  . CKD (chronic kidney disease) stage 4, GFR 15-29 ml/min     Cr 1.6 in 6/11, sees Dr. Arty Baumgartner  . Impaired vision     pt. reports that she identifies her meds by looking at the pillls, she is not able to read labels on bottles    Current Outpatient Prescriptions on File Prior to Visit  Medication Sig Dispense Refill  .  albuterol (PROVENTIL HFA;VENTOLIN HFA) 108 (90 BASE) MCG/ACT inhaler Inhale 2 puffs into the lungs every 6 (six) hours as needed for wheezing or shortness of breath. 1 Inhaler 0  . ammonium lactate (AMLACTIN) 12 % lotion Apply 1 application topically as needed for dry skin. 225 g 0  . aspirin 81 MG EC tablet Take 1 tablet (81 mg total) by mouth daily. 30 tablet 3  . atorvastatin (LIPITOR) 40 MG tablet Take 1 tablet (40 mg total) by mouth daily. 30 tablet 2  . Blood Glucose Monitoring Suppl (ONE TOUCH ULTRA MINI) W/DEVICE KIT USE AS DIRECTED TO TEST BLOOD GLUCOSE ONCE DAILY DX: 250.00 1 each 0  . carvedilol (COREG) 25 MG tablet Take 12.5 mg by mouth 2 (two) times daily after a meal.    . Cholecalciferol (VITAMIN D-3) 1000 UNITS CAPS Take 1 capsule by mouth daily.    . clonazePAM (KLONOPIN) 0.5 MG tablet Take 1 tablet (0.5 mg total) by mouth at bedtime. 20 tablet 1  . fluticasone (FLONASE) 50 MCG/ACT nasal spray Place 2 sprays into both nostrils daily. 16 g 0  . glipiZIDE (GLUCOTROL XL) 10 MG 24 hr tablet TAKE ONE TABLET BY MOUTH IN THE MORNING 30 tablet 3  . glucose blood test strip Use as instructed to test blood glucose once daily Dx: E11.9 100 each 12  . HYDROcodone-acetaminophen (NORCO/VICODIN) 5-325 MG per tablet Take 1  tablet by mouth every 6 (six) hours as needed for moderate pain. BACK PAIN    . levothyroxine (SYNTHROID, LEVOTHROID) 50 MCG tablet TAKE ONE TABLET BY MOUTH ONCE DAILY BEFORE BREAKFAST 90 tablet 0  . polyethylene glycol powder (GLYCOLAX/MIRALAX) powder Take 17 g by mouth daily. 850 g 3   No current facility-administered medications on file prior to visit.    Allergies  Allergen Reactions  . Keflex [Cephalexin]     rash  . Metformin     REACTION: intolerant  . Promethazine Hcl     REACTION: u/k  . Diflucan [Fluconazole] Rash    Renal Failure    Family History  Problem Relation Age of Onset  . Stroke Father     died in his 40's  . Stroke Mother     died in her  96's  . Diabetes Father   . Prostate cancer Son   . Diabetes Son     #2  . Hypertension Son     #3  . Thyroid disease Neg Hx   . Heart attack Neg Hx     History   Social History  . Marital Status: Widowed    Spouse Name: N/A  . Number of Children: 3  . Years of Education: N/A   Occupational History  . Retired    Social History Main Topics  . Smoking status: Never Smoker   . Smokeless tobacco: Never Used  . Alcohol Use: No  . Drug Use: No  . Sexual Activity: No   Other Topics Concern  . None   Social History Narrative   Widowed.  Lives with son in [place].            Review of Systems - See HPI.  All other ROS are negative.  BP 118/76 mmHg  Pulse 76  Resp 14  Ht _0  (1.575 m)  Wt 105 lb 6 oz (47.798 kg)  BMI 19.27 kg/m2  SpO2 96%  Physical Exam  Constitutional: She is well-developed, well-nourished, and in no distress.  HENT:  Head: Normocephalic and atraumatic.  Eyes: Conjunctivae are normal.  Neck: Neck supple.  Cardiovascular: Normal rate, regular rhythm, normal heart sounds and intact distal pulses.   Pulmonary/Chest: Effort normal and breath sounds normal. No respiratory distress. She has no wheezes. She has no rales. She exhibits no tenderness.  Abdominal: Soft. Bowel sounds are normal. She exhibits no distension and no mass. There is no tenderness. There is no rebound and no guarding.  Musculoskeletal:       Left knee: She exhibits normal range of motion, no swelling, no effusion, normal alignment, no LCL laxity, normal patellar mobility, no bony tenderness, normal meniscus and no MCL laxity. No tenderness found.  Skin: Skin is warm. No rash noted.  Psychiatric: Affect normal.  Vitals reviewed.  Assessment/Plan: CN (constipation) Doing well.  Continue current regimen.  Will check BMP today to reassess potassium levels as patient using laxative occasionally.   Left knee pain Suspect OA. Not reproducible on exam.  No deformity noted.  Supportive measures reviewed.  Topical Bengay before prolonged standing or exercise. Return precautions discussed with patient and mother.

## 2014-08-25 NOTE — Assessment & Plan Note (Signed)
Suspect OA. Not reproducible on exam.  No deformity noted. Supportive measures reviewed.  Topical Bengay before prolonged standing or exercise. Return precautions discussed with patient and mother.

## 2014-08-26 ENCOUNTER — Telehealth: Payer: Self-pay | Admitting: *Deleted

## 2014-08-26 DIAGNOSIS — N184 Chronic kidney disease, stage 4 (severe): Secondary | ICD-10-CM

## 2014-08-26 NOTE — Telephone Encounter (Signed)
Patient Unavailable; spoke with Son & Daughter Inlaw [Teresa], informed, understood & agreed; lab order placed  & appt scheduled for 07.01.016 at 11:00am for BMET per provider instructions/SLS

## 2014-08-26 NOTE — Telephone Encounter (Signed)
-----   Message from [name], PA-C sent at 08/25/2014  5:11 PM EDT ----- A1C much improved. Renal function is still declined and slightly worsened since last check.  Would like her to decrease Glucotrol XL tablet to 5 mg (1/2) tablet daily and follow-up in lab in 3-4 weeks for repeat BMP.

## 2014-08-27 ENCOUNTER — Encounter (HOSPITAL_BASED_OUTPATIENT_CLINIC_OR_DEPARTMENT_OTHER): Payer: Self-pay | Admitting: *Deleted

## 2014-08-27 ENCOUNTER — Emergency Department (HOSPITAL_BASED_OUTPATIENT_CLINIC_OR_DEPARTMENT_OTHER): Payer: Medicare Other

## 2014-08-27 ENCOUNTER — Emergency Department (HOSPITAL_BASED_OUTPATIENT_CLINIC_OR_DEPARTMENT_OTHER)
Admission: EM | Admit: 2014-08-27 | Discharge: 2014-08-28 | Disposition: A | Discharge status: Home / Self Care | Payer: Medicare Other | Attending: Emergency Medicine | Admitting: Emergency Medicine

## 2014-08-27 DIAGNOSIS — S3991XA Unspecified injury of abdomen, initial encounter: Secondary | ICD-10-CM | POA: Insufficient documentation

## 2014-08-27 DIAGNOSIS — Y9289 Other specified places as the place of occurrence of the external cause: Secondary | ICD-10-CM | POA: Insufficient documentation

## 2014-08-27 DIAGNOSIS — Z8669 Personal history of other diseases of the nervous system and sense organs: Secondary | ICD-10-CM | POA: Diagnosis not present

## 2014-08-27 DIAGNOSIS — Y998 Other external cause status: Secondary | ICD-10-CM | POA: Diagnosis not present

## 2014-08-27 DIAGNOSIS — Z950 Presence of cardiac pacemaker: Secondary | ICD-10-CM | POA: Insufficient documentation

## 2014-08-27 DIAGNOSIS — I5032 Chronic diastolic (congestive) heart failure: Secondary | ICD-10-CM | POA: Insufficient documentation

## 2014-08-27 DIAGNOSIS — J45909 Unspecified asthma, uncomplicated: Secondary | ICD-10-CM | POA: Diagnosis not present

## 2014-08-27 DIAGNOSIS — Z7951 Long term (current) use of inhaled steroids: Secondary | ICD-10-CM | POA: Diagnosis not present

## 2014-08-27 DIAGNOSIS — E039 Hypothyroidism, unspecified: Secondary | ICD-10-CM | POA: Diagnosis not present

## 2014-08-27 DIAGNOSIS — S161XXA Strain of muscle, fascia and tendon at neck level, initial encounter: Secondary | ICD-10-CM | POA: Insufficient documentation

## 2014-08-27 DIAGNOSIS — S0990XA Unspecified injury of head, initial encounter: Secondary | ICD-10-CM

## 2014-08-27 DIAGNOSIS — Z954 Presence of other heart-valve replacement: Secondary | ICD-10-CM | POA: Diagnosis not present

## 2014-08-27 DIAGNOSIS — S7001XA Contusion of right hip, initial encounter: Secondary | ICD-10-CM | POA: Insufficient documentation

## 2014-08-27 DIAGNOSIS — Z79899 Other long term (current) drug therapy: Secondary | ICD-10-CM | POA: Diagnosis not present

## 2014-08-27 DIAGNOSIS — Z9861 Coronary angioplasty status: Secondary | ICD-10-CM | POA: Diagnosis not present

## 2014-08-27 DIAGNOSIS — E780 Pure hypercholesterolemia: Secondary | ICD-10-CM | POA: Diagnosis not present

## 2014-08-27 DIAGNOSIS — W01198A Fall on same level from slipping, tripping and stumbling with subsequent striking against other object, initial encounter: Secondary | ICD-10-CM | POA: Insufficient documentation

## 2014-08-27 DIAGNOSIS — Z872 Personal history of diseases of the skin and subcutaneous tissue: Secondary | ICD-10-CM | POA: Diagnosis not present

## 2014-08-27 DIAGNOSIS — Y9389 Activity, other specified: Secondary | ICD-10-CM | POA: Diagnosis not present

## 2014-08-27 DIAGNOSIS — Z8719 Personal history of other diseases of the digestive system: Secondary | ICD-10-CM | POA: Diagnosis not present

## 2014-08-27 DIAGNOSIS — Z7982 Long term (current) use of aspirin: Secondary | ICD-10-CM | POA: Diagnosis not present

## 2014-08-27 DIAGNOSIS — Z862 Personal history of diseases of the blood and blood-forming organs and certain disorders involving the immune mechanism: Secondary | ICD-10-CM | POA: Insufficient documentation

## 2014-08-27 DIAGNOSIS — Z9889 Other specified postprocedural states: Secondary | ICD-10-CM | POA: Diagnosis not present

## 2014-08-27 DIAGNOSIS — W108XXA Fall (on) (from) other stairs and steps, initial encounter: Secondary | ICD-10-CM | POA: Insufficient documentation

## 2014-08-27 DIAGNOSIS — I2510 Atherosclerotic heart disease of native coronary artery without angina pectoris: Secondary | ICD-10-CM | POA: Insufficient documentation

## 2014-08-27 DIAGNOSIS — E119 Type 2 diabetes mellitus without complications: Secondary | ICD-10-CM | POA: Diagnosis not present

## 2014-08-27 DIAGNOSIS — Z951 Presence of aortocoronary bypass graft: Secondary | ICD-10-CM | POA: Insufficient documentation

## 2014-08-27 DIAGNOSIS — I129 Hypertensive chronic kidney disease with stage 1 through stage 4 chronic kidney disease, or unspecified chronic kidney disease: Secondary | ICD-10-CM | POA: Diagnosis not present

## 2014-08-27 DIAGNOSIS — N184 Chronic kidney disease, stage 4 (severe): Secondary | ICD-10-CM | POA: Insufficient documentation

## 2014-08-27 DIAGNOSIS — S0003XA Contusion of scalp, initial encounter: Secondary | ICD-10-CM | POA: Insufficient documentation

## 2014-08-27 DIAGNOSIS — W19XXXA Unspecified fall, initial encounter: Secondary | ICD-10-CM

## 2014-08-27 NOTE — ED Notes (Signed)
Pt c/o fall from standing injuring head x 1 hr ago

## 2014-08-27 NOTE — ED Notes (Signed)
MD at bedside. 

## 2014-08-27 NOTE — ED Provider Notes (Signed)
CSN: [id number]     Arrival date & time 08/27/14  2301 History   This chart was scribed for Veryl Speak, MD by Julien Nordmann, ED Scribe. This patient was seen in room MH02/MH02 and the patient's care was started at 11:25 PM.    Chief Complaint  Patient presents with  . Fall     Patient is a 80 y.o. female presenting with fall. The history is provided by the patient. No language interpreter was used.  Fall This is a new problem. The current episode started 1 to 2 hours ago. The problem occurs rarely. The problem has not changed since onset.Associated symptoms include abdominal pain and headaches. Nothing aggravates the symptoms. Nothing relieves the symptoms. She has tried nothing for the symptoms.    HPI Comments: Barbara Garner is a 80 y.o. female who has PMHx of DM presents to the Emergency Department complaining of a fall that occurred about one hour ago. She has associated head pain and abdominal pain. Per son, he believes pt fell down 8 stairs sideways and hit her head. Daughter in law says she gave pt some Hydrocodone PTA. He notes the earlier, pt was complaining of right hip pain prior to the fall. Pt has a pacemaker and is not on blood thinners. Per son, he denies LOC, chest pain, shortness of breath, and neck pain.   Past Medical History  Diagnosis Date  . Iron deficiency anemia, unspecified   . Chronic diastolic CHF (congestive heart failure)   . CAD  S/P CABG x 3 08/2011     LIMA to diagonal branch, SVG to OM1, SVG to PDA, EVH via bilateral thighs  . Type II or unspecified type diabetes mellitus without mention of complication, not stated as uncontrolled   . Contact dermatitis and other eczema, due to unspecified cause   . Esophageal reflux   . Headache(784.0)   . Pacemaker MDT dual chamber     DOI 08/2011  . Pure hypercholesterolemia   . hypertension   . Hypothyroidism   . Atrioventricular block, complete -intermittent   . Memory loss   . Idiopathic peripheral neuropathy    . LBBB (left bundle branch block)   . Carotid stenosis     Carotid dopplers 2/84 with 13% LICA stenosis  . Severe aortic stenosis -S/P AVR      19 mm [facility] Ease pericardial tissue valve  . Extrinsic asthma, unspecified     hasn't used inhaler in past year  . CKD (chronic kidney disease) stage 4, GFR 15-29 ml/min     Cr 1.6 in 6/11, sees Dr. Arty Baumgartner  . Impaired vision     pt. reports that she identifies her meds by looking at the pillls, she is not able to read labels on bottles   Past Surgical History  Procedure Laterality Date  . Ptca  [phone]    5 blockages  . Cardiac catheterization    . Esophageal dilation  1997  . Colonoscopy  04/1999    1 polyp  . Esophagogastroduodenoscopy  04/1999    HH; "watermelon stomach" (severe gastritis)  . Dexa  10/2003 ,09/2005    osteoporosis,  Osteopenia  . Cataract extraction, bilateral  2003  . Esophagogastroduodenoscopy  04/2002    Gastritis  . Cardiovascular stress test  10/2002    normal (per patient)  . Abdominal hysterectomy  1977    partial; fibroids  . Bladder tack  1977  . Colonoscopy  06/2004    Neg. Int hem  .  Carotid dopplers  2006  . Refractive surgery  2006  . Opthy  11/1998;12/01;11/02  . Aortic valve replacement  09/06/2011    Procedure: AORTIC VALVE REPLACEMENT (AVR);  Surgeon: Rexene Alberts, MD;  Location: [place];  Service: Open Heart Surgery;  Laterality: N/A;  . Coronary artery bypass graft  09/06/2011    Procedure: CORONARY ARTERY BYPASS GRAFTING (CABG);  Surgeon: Rexene Alberts, MD;  Location: [place];  Service: Open Heart Surgery;  Laterality: N/A;  . Colonoscopy  08/2013  . Left and right heart catheterization with coronary angiogram N/A 08/11/2011    Procedure: LEFT AND RIGHT HEART CATHETERIZATION WITH CORONARY ANGIOGRAM;  Surgeon: Larey Dresser, MD;  Location: [facility] CATH LAB;  Service: Cardiovascular;  Laterality: N/A;  . Permanent pacemaker insertion N/A July 19, 202013    Procedure: PERMANENT PACEMAKER INSERTION;   Surgeon: Deboraha Sprang, MD;  Location: [facility] CATH LAB;  Service: Cardiovascular;  Laterality: N/A;   Family History  Problem Relation Age of Onset  . Stroke Father     died in his 52's  . Stroke Mother     died in her 20's  . Diabetes Father   . Prostate cancer Son   . Diabetes Son     #2  . Hypertension Son     #3  . Thyroid disease Neg Hx   . Heart attack Neg Hx    History  Substance Use Topics  . Smoking status: Never Smoker   . Smokeless tobacco: Never Used  . Alcohol Use: No   OB History    No data available     Review of Systems  Gastrointestinal: Positive for abdominal pain.  Neurological: Positive for headaches.    A complete 10 system review of systems was obtained and all systems are negative except as noted in the HPI and PMH.    Allergies  Keflex; Metformin; Promethazine hcl; and Diflucan  Home Medications   Prior to Admission medications   Medication Sig Start Date End Date Taking? Authorizing Provider  albuterol (PROVENTIL HFA;VENTOLIN HFA) 108 (90 BASE) MCG/ACT inhaler Inhale 2 puffs into the lungs every 6 (six) hours as needed for wheezing or shortness of breath. 02/02/14   [name], PA-C  ammonium lactate (AMLACTIN) 12 % lotion Apply 1 application topically as needed for dry skin. 03/09/14   Mackie Pai, PA-C  aspirin 81 MG EC tablet Take 1 tablet (81 mg total) by mouth daily. 11/14/13   [name], PA-C  atorvastatin (LIPITOR) 40 MG tablet Take 1 tablet (40 mg total) by mouth daily. 06/03/14   [name], PA-C  Blood Glucose Monitoring Suppl (ONE TOUCH ULTRA MINI) W/DEVICE KIT USE AS DIRECTED TO TEST BLOOD GLUCOSE ONCE DAILY DX: 250.00 11/14/13   [name], PA-C  carvedilol (COREG) 25 MG tablet Take 12.5 mg by mouth 2 (two) times daily after a meal. 03/09/14   Historical Provider, MD  Cholecalciferol (VITAMIN D-3) 1000 UNITS CAPS Take 1 capsule by mouth daily.    Historical Provider, MD  clonazePAM (KLONOPIN) 0.5 MG tablet  Take 1 tablet (0.5 mg total) by mouth at bedtime. 03/27/14   [name], PA-C  fluticasone (FLONASE) 50 MCG/ACT nasal spray Place 2 sprays into both nostrils daily. 01/09/14   [name], PA-C  glipiZIDE (GLUCOTROL XL) 10 MG 24 hr tablet TAKE ONE TABLET BY MOUTH IN THE MORNING 08/10/14   [name], PA-C  glucose blood test strip Use as instructed to test blood  glucose once daily Dx: E11.9 03/17/14   [name], PA-C  HYDROcodone-acetaminophen (NORCO/VICODIN) 5-325 MG per tablet Take 1 tablet by mouth every 6 (six) hours as needed for moderate pain. BACK PAIN 11/14/13   [name], PA-C  levothyroxine (SYNTHROID, LEVOTHROID) 50 MCG tablet TAKE ONE TABLET BY MOUTH ONCE DAILY BEFORE BREAKFAST 07/01/14   [name], PA-C  polyethylene glycol powder (GLYCOLAX/MIRALAX) powder Take 17 g by mouth daily. 06/17/14   [name], PA-C   Triage vitals: BP 177/80 mmHg  Pulse 64  Temp(Src) 97.6 F (36.4 C)  Resp 16  Ht 5' 1" (1.549 m)  Wt 105 lb (47.628 kg)  BMI 19.85 kg/m2  SpO2 96% Physical Exam  Constitutional: She is oriented to person, place, and time. She appears well-developed and well-nourished. No distress.  HENT:  Head: Normocephalic.  Mouth/Throat: Oropharynx is clear and moist.  There is a contusion and swelling ot the right parietal scalp. TMs clear bilaterally.  Eyes: Conjunctivae are normal. Pupils are equal, round, and reactive to light. No scleral icterus.  Neck: Normal range of motion. Neck supple. No thyromegaly present.  Cardiovascular: Normal rate and regular rhythm.  Exam reveals no gallop and no friction rub.   No murmur heard. Pulmonary/Chest: Effort normal and breath sounds normal. No respiratory distress. She has no wheezes. She has no rales.  Abdominal: Soft. Bowel sounds are normal. She exhibits no distension. There is no tenderness. There is no rebound.  Musculoskeletal: Normal range of motion.  There is Tenderness to palpation over  the right iliac crest and hip.  There is no shortening or rotation of the leg. Distal PMS is intact to the RLE  Neurological: She is alert and oriented to person, place, and time.  Skin: Skin is warm and dry. No rash noted.  Psychiatric: She has a normal mood and affect. Her behavior is normal.  Nursing note and vitals reviewed.   ED Course  Procedures  DIAGNOSTIC STUDIES: Oxygen Saturation is 96% on RA, adequate by my interpretation.  COORDINATION OF CARE:  11:30 PM Discussed treatment plan which includes CT scan of head, X-ray of hip and pelvis with pt at bedside and pt agreed to plan.   Labs Review Labs Reviewed - No data to display  Imaging Review No results found.   EKG Interpretation None      MDM   Final diagnoses:  None    X-rays reveal no evidence for intracranial injury, cervical spine fracture, or fracture to the hip or pelvis. She is neurologically intact and is ambulatory, however with some discomfort. She will be prescribed pain medication which she can take and advised to follow-up with her Dr. if not improving in the next few days.   I personally performed the services described in this documentation, which was scribed in my presence. The recorded information has been reviewed and is accurate.      Veryl Speak, MD 08/28/14 [phone]

## 2014-08-28 MED ORDER — HYDROCODONE-ACETAMINOPHEN 5-325 MG PO TABS: 1.0000 | ORAL_TABLET | Freq: Four times a day (QID) | ORAL | Status: DC | PRN

## 2014-08-28 NOTE — ED Notes (Signed)
MD at bedside discussing test results and dispo plan of care. 

## 2014-08-28 NOTE — Discharge Instructions (Signed)
Hydrocodone as prescribed as needed for pain.  Follow up with your primary Dr. if not improving in the next week, and return to the ER if symptoms significantly worsen or change.   Concussion A concussion, or closed-head injury, is a brain injury caused by a direct blow to the head or by a quick and sudden movement (jolt) of the head or neck. Concussions are usually not life-threatening. Even so, the effects of a concussion can be serious. If you have had a concussion before, you are more likely to experience concussion-like symptoms after a direct blow to the head.  CAUSES  Direct blow to the head, such as from running into another player during a soccer game, being hit in a fight, or hitting your head on a hard surface.  A jolt of the head or neck that causes the brain to move back and forth inside the skull, such as in a car crash. SIGNS AND SYMPTOMS The signs of a concussion can be hard to notice. Early on, they may be missed by you, family members, and health care providers. You may look fine but act or feel differently. Symptoms are usually temporary, but they may last for days, weeks, or even longer. Some symptoms may appear right away while others may not show up for hours or days. Every head injury is different. Symptoms include:  Mild to moderate headaches that will not go away.  A feeling of pressure inside your head.  Having more trouble than usual:  Learning or remembering things you have heard.  Answering questions.  Paying attention or concentrating.  Organizing daily tasks.  Making decisions and solving problems.  Slowness in thinking, acting or reacting, speaking, or reading.  Getting lost or being easily confused.  Feeling tired all the time or lacking energy (fatigued).  Feeling drowsy.  Sleep disturbances.  Sleeping more than usual.  Sleeping less than usual.  Trouble falling asleep.  Trouble sleeping (insomnia).  Loss of balance or feeling  lightheaded or dizzy.  Nausea or vomiting.  Numbness or tingling.  Increased sensitivity to:  Sounds.  Lights.  Distractions.  Vision problems or eyes that tire easily.  Diminished sense of taste or smell.  Ringing in the ears.  Mood changes such as feeling sad or anxious.  Becoming easily irritated or angry for little or no reason.  Lack of motivation.  Seeing or hearing things other people do not see or hear (hallucinations). DIAGNOSIS Your health care provider can usually diagnose a concussion based on a description of your injury and symptoms. He or she will ask whether you passed out (lost consciousness) and whether you are having trouble remembering events that happened right before and during your injury. Your evaluation might include:  A brain scan to look for signs of injury to the brain. Even if the test shows no injury, you may still have a concussion.  Blood tests to be sure other problems are not present. TREATMENT  Concussions are usually treated in an emergency department, in urgent care, or at a clinic. You may need to stay in the hospital overnight for further treatment.  Tell your health care provider if you are taking any medicines, including prescription medicines, over-the-counter medicines, and natural remedies. Some medicines, such as blood thinners (anticoagulants) and aspirin, may increase the chance of complications. Also tell your health care provider whether you have had alcohol or are taking illegal drugs. This information may affect treatment.  Your health care provider will send you home  with important instructions to follow.  How fast you will recover from a concussion depends on many factors. These factors include how severe your concussion is, what part of your brain was injured, your age, and how healthy you were before the concussion.  Most people with mild injuries recover fully. Recovery can take time. In general, recovery is slower in  older persons. Also, persons who have had a concussion in the past or have other medical problems may find that it takes longer to recover from their current injury. HOME CARE INSTRUCTIONS General Instructions  Carefully follow the directions your health care provider gave you.  Only take over-the-counter or prescription medicines for pain, discomfort, or fever as directed by your health care provider.  Take only those medicines that your health care provider has approved.  Do not drink alcohol until your health care provider says you are well enough to do so. Alcohol and certain other drugs may slow your recovery and can put you at risk of further injury.  If it is harder than usual to remember things, write them down.  If you are easily distracted, try to do one thing at a time. For example, do not try to watch TV while fixing dinner.  Talk with family members or close friends when making important decisions.  Keep all follow-up appointments. Repeated evaluation of your symptoms is recommended for your recovery.  Watch your symptoms and tell others to do the same. Complications sometimes occur after a concussion. Older adults with a brain injury may have a higher risk of serious complications, such as a blood clot on the brain.  Tell your teachers, school nurse, school counselor, coach, athletic trainer, or work Freight forwarder about your injury, symptoms, and restrictions. Tell them about what you can or cannot do. They should watch for:  Increased problems with attention or concentration.  Increased difficulty remembering or learning new information.  Increased time needed to complete tasks or assignments.  Increased irritability or decreased ability to cope with stress.  Increased symptoms.  Rest. Rest helps the brain to heal. Make sure you:  Get plenty of sleep at night. Avoid staying up late at night.  Keep the same bedtime hours on weekends and weekdays.  Rest during the day.  Take daytime naps or rest breaks when you feel tired.  Limit activities that require a lot of thought or concentration. These include:  Doing homework or job-related work.  Watching TV.  Working on the computer.  Avoid any situation where there is potential for another head injury (football, hockey, soccer, basketball, martial arts, downhill snow sports and horseback riding). Your condition will get worse every time you experience a concussion. You should avoid these activities until you are evaluated by the appropriate follow-up health care providers. Returning To Your Regular Activities You will need to return to your normal activities slowly, not all at once. You must give your body and brain enough time for recovery.  Do not return to sports or other athletic activities until your health care provider tells you it is safe to do so.  Ask your health care provider when you can drive, ride a bicycle, or operate heavy machinery. Your ability to react may be slower after a brain injury. Never do these activities if you are dizzy.  Ask your health care provider about when you can return to work or school. Preventing Another Concussion It is very important to avoid another brain injury, especially before you have recovered. In rare cases,  another injury can lead to permanent brain damage, brain swelling, or death. The risk of this is greatest during the first 7-10 days after a head injury. Avoid injuries by:  Wearing a seat belt when riding in a car.  Drinking alcohol only in moderation.  Wearing a helmet when biking, skiing, skateboarding, skating, or doing similar activities.  Avoiding activities that could lead to a second concussion, such as contact or recreational sports, until your health care provider says it is okay.  Taking safety measures in your home.  Remove clutter and tripping hazards from floors and stairways.  Use grab bars in bathrooms and handrails by stairs.  Place  non-slip mats on floors and in bathtubs.  Improve lighting in dim areas. SEEK MEDICAL CARE IF:  You have increased problems paying attention or concentrating.  You have increased difficulty remembering or learning new information.  You need more time to complete tasks or assignments than before.  You have increased irritability or decreased ability to cope with stress.  You have more symptoms than before. Seek medical care if you have any of the following symptoms for more than 2 weeks after your injury:  Lasting (chronic) headaches.  Dizziness or balance problems.  Nausea.  Vision problems.  Increased sensitivity to noise or light.  Depression or mood swings.  Anxiety or irritability.  Memory problems.  Difficulty concentrating or paying attention.  Sleep problems.  Feeling tired all the time. SEEK IMMEDIATE MEDICAL CARE IF:  You have severe or worsening headaches. These may be a sign of a blood clot in the brain.  You have weakness (even if only in one hand, leg, or part of the face).  You have numbness.  You have decreased coordination.  You vomit repeatedly.  You have increased sleepiness.  One pupil is larger than the other.  You have convulsions.  You have slurred speech.  You have increased confusion. This may be a sign of a blood clot in the brain.  You have increased restlessness, agitation, or irritability.  You are unable to recognize people or places.  You have neck pain.  It is difficult to wake you up.  You have unusual behavior changes.  You lose consciousness. MAKE SURE YOU:  Understand these instructions.  Will watch your condition.  Will get help right away if you are not doing well or get worse. Document Released: 05/27/2003 Document Revised: 03/11/2013 Document Reviewed: 09/26/2012 [facility] Patient Information 2015 [place], [place]. This information is not intended to replace advice given to you by your health care  provider. Make sure you discuss any questions you have with your health care provider.  Contusion A contusion is a deep bruise. Contusions are the result of an injury that caused bleeding under the skin. The contusion may turn blue, purple, or yellow. Minor injuries will give you a painless contusion, but more severe contusions may stay painful and swollen for a few weeks.  CAUSES  A contusion is usually caused by a blow, trauma, or direct force to an area of the body. SYMPTOMS   Swelling and redness of the injured area.  Bruising of the injured area.  Tenderness and soreness of the injured area.  Pain. DIAGNOSIS  The diagnosis can be made by taking a history and physical exam. An X-ray, CT scan, or MRI may be needed to determine if there were any associated injuries, such as fractures. TREATMENT  Specific treatment will depend on what area of the body was injured. In general, the  best treatment for a contusion is resting, icing, elevating, and applying cold compresses to the injured area. Over-the-counter medicines may also be recommended for pain control. Ask your caregiver what the best treatment is for your contusion. HOME CARE INSTRUCTIONS   Put ice on the injured area.  Put ice in a plastic bag.  Place a towel between your skin and the bag.  Leave the ice on for 15-20 minutes, 3-4 times a day, or as directed by your health care provider.  Only take over-the-counter or prescription medicines for pain, discomfort, or fever as directed by your caregiver. Your caregiver may recommend avoiding anti-inflammatory medicines (aspirin, ibuprofen, and naproxen) for 48 hours because these medicines may increase bruising.  Rest the injured area.  If possible, elevate the injured area to reduce swelling. SEEK IMMEDIATE MEDICAL CARE IF:   You have increased bruising or swelling.  You have pain that is getting worse.  Your swelling or pain is not relieved with medicines. MAKE SURE YOU:    Understand these instructions.  Will watch your condition.  Will get help right away if you are not doing well or get worse. Document Released: 12/14/2004 Document Revised: 03/11/2013 Document Reviewed: 01/09/2011 [facility] Patient Information 2015 [place], [place]. This information is not intended to replace advice given to you by your health care provider. Make sure you discuss any questions you have with your health care provider.

## 2014-09-14 ENCOUNTER — Other Ambulatory Visit: Payer: Self-pay

## 2014-09-18 ENCOUNTER — Encounter: Payer: Self-pay | Admitting: *Deleted

## 2014-09-18 ENCOUNTER — Other Ambulatory Visit (INDEPENDENT_AMBULATORY_CARE_PROVIDER_SITE_OTHER): Payer: Medicare Other

## 2014-09-18 DIAGNOSIS — N184 Chronic kidney disease, stage 4 (severe): Secondary | ICD-10-CM

## 2014-09-18 LAB — BASIC METABOLIC PANEL
BUN: 31 mg/dL — ABNORMAL HIGH (ref 6–23)
CALCIUM: 9.1 mg/dL (ref 8.4–10.5)
CO2: 26 meq/L (ref 19–32)
Chloride: 104 mEq/L (ref 96–112)
Creatinine, Ser: 1.19 mg/dL (ref 0.40–1.20)
GFR: 46.58 mL/min — AB (ref >60.00)
Glucose, Bld: 136 mg/dL — ABNORMAL HIGH (ref 70–99)
POTASSIUM: 4.4 meq/L (ref 3.5–5.1)
Sodium: 139 mEq/L (ref 135–145)

## 2014-09-22 ENCOUNTER — Encounter: Payer: Self-pay | Admitting: *Deleted

## 2014-09-28 ENCOUNTER — Other Ambulatory Visit: Payer: Self-pay | Admitting: *Deleted

## 2014-09-28 MED ORDER — GLIPIZIDE ER 10 MG PO TB24: ORAL_TABLET | ORAL | Status: DC

## 2014-09-28 MED ORDER — ATORVASTATIN CALCIUM 40 MG PO TABS: 40.0000 mg | ORAL_TABLET | Freq: Every day | ORAL | Status: DC

## 2014-09-28 NOTE — Telephone Encounter (Signed)
Rx's sent to the pharmacy by e-script.//AB/CMA 

## 2014-09-29 ENCOUNTER — Other Ambulatory Visit: Payer: Self-pay | Admitting: Physician Assistant

## 2014-10-13 ENCOUNTER — Encounter: Payer: Self-pay | Admitting: *Deleted

## 2014-12-02 ENCOUNTER — Other Ambulatory Visit: Payer: Self-pay | Admitting: Physician Assistant

## 2014-12-06 NOTE — Progress Notes (Signed)
Cardiology Office Note   Date:  12/07/2014   ID:  Barbara Garner, DOB [birth year], MRN [id number]  PCP:  Leeanne Rio, PA-C  Cardiologist:  Dr. Loralie Champagne  Electrophysiologist:  Dr. Virl Axe   Chief Complaint  Patient presents with  . Coronary Artery Disease  . Congestive Heart Failure  . Aortic Stenosis     History of Present Illness: Barbara Garner is a 80 y.o. female with a hx of CAD and aortic stenosis s/p CABG + bioprosthetic AVR in 2013, CHB s/p PPM, diastolic CHF, LBBB, HTN, CKD, dementia. Admitted several times last year. She was admitted in 09/8293 with a/c diastolic CHF and bronchitis. Diuresis was c/b AKI. She developed worsening anemia requiring transfusion with PRBCs. FU colonoscopy demonstrated diverticulosis and EGD demonstrated gastritis. She was admitted twice in July 2015. One admission was for severe back pain thought to be due to HNP. She was referred to PT. Pan-cultures (including CSF) were negative in setting of leukocytosis. She was also admitted for a/c diastolic CHF in the setting of pneumonia.   Last seen in clinic by me 12/15.  She was admitted later in 12/15 with generalized rash suspected to be a drug reaction complicated by acute renal failure as well as acute encephalopathy. Encephalopathy was likely related to IV steroids.    Here for FU.  She is here with her son.  She is doing well. She is eating good and exercising daily.  She walks several times a day and takes care of her miniature [place]. She denies chest pain, syncope, dyspnea, orthopnea, edema.     Studies: - Echo (5/15): EF 60-65%, Gr 2 DD, AVR ok (mean 9 mmHg), MAC, mild MS (mean 6 mmHg), mild to mod MR, mod LAE - Carotid US (6/15): Bilateral ICA 40-59%   Past Medical History  Diagnosis Date  . Iron deficiency anemia, unspecified   . Chronic diastolic CHF (congestive heart failure)   . CAD  S/P CABG x 3 08/2011     LIMA to diagonal branch, SVG to  OM1, SVG to PDA, EVH via bilateral thighs  . Type II or unspecified type diabetes mellitus without mention of complication, not stated as uncontrolled   . Contact dermatitis and other eczema, due to unspecified cause   . Esophageal reflux   . Headache(784.0)   . Pacemaker MDT dual chamber     DOI 08/2011  . Pure hypercholesterolemia   . hypertension   . Hypothyroidism   . Atrioventricular block, complete -intermittent   . Memory loss   . Idiopathic peripheral neuropathy   . LBBB (left bundle branch block)   . Carotid stenosis     Carotid dopplers 6/21 with 30% LICA stenosis  . Severe aortic stenosis -S/P AVR      19 mm [facility] Ease pericardial tissue valve  . Extrinsic asthma, unspecified     hasn't used inhaler in past year  . CKD (chronic kidney disease) stage 4, GFR 15-29 ml/min     Cr 1.6 in 6/11, sees Dr. Arty Baumgartner  . Impaired vision     pt. reports that she identifies her meds by looking at the pillls, she is not able to read labels on bottles  1. Anemia-iron deficiency  2. Asthma  3. Coronary artery disease: Unstable angina in 1997 with PTCA of large D1. Myoview (9/10) at [facility] cardiology showed EF 84%, stable perfusion images with no ischemia. LHC (5/13) showed 95% OM1 stenosis, 75% mLAD, 80% ostial  large D1, 75% dRCA. She had LIMA-LAD, SVG-PDA, and SVG-OM at time of AVR in 6/13.  4. Diabetes mellitus, type II  5. GERD  6. Hypertension  7. Hypothyroidism  8. Pneumonia, hx of  9. CKD: creatinine 1.6 in 6/11  10. Chronic LBBB  11. Aortic stenosis: Severe. Echo (3/13): EF 55-65%, moderately LV hypertrophy, severe AS with mean gradient 40 mmHg/peak gradient 70 mmHg, PA systolic pressure 35 mmHg. Patient had bioprosthetic AVR in 6/13. Echo post-op in 6/13 showed mild LVH, EF 65-70%, mean gradient 14 mmHg across bioprosthetic aortic valve.  12. Carotid stenosis: Carotid dopplers 4/69 with 62% LICA stenosis. Carotid dopplers (3/13) with 40-59% bilateral carotid  stenosis.  13. Post-op complete heart block in 6/13 with Medtronic dual chamber PPM.    Past Surgical History  Procedure Laterality Date  . Ptca  [phone]    5 blockages  . Cardiac catheterization    . Esophageal dilation  1997  . Colonoscopy  04/1999    1 polyp  . Esophagogastroduodenoscopy  04/1999    HH; "watermelon stomach" (severe gastritis)  . Dexa  10/2003 ,09/2005    osteoporosis,  Osteopenia  . Cataract extraction, bilateral  2003  . Esophagogastroduodenoscopy  04/2002    Gastritis  . Cardiovascular stress test  10/2002    normal (per patient)  . Abdominal hysterectomy  1977    partial; fibroids  . Bladder tack  1977  . Colonoscopy  06/2004    Neg. Int hem  . Carotid dopplers  2006  . Refractive surgery  2006  . Opthy  11/1998;12/01;11/02  . Aortic valve replacement  09/06/2011    Procedure: AORTIC VALVE REPLACEMENT (AVR);  Surgeon: Rexene Alberts, MD;  Location: [place];  Service: Open Heart Surgery;  Laterality: N/A;  . Coronary artery bypass graft  09/06/2011    Procedure: CORONARY ARTERY BYPASS GRAFTING (CABG);  Surgeon: Rexene Alberts, MD;  Location: [place];  Service: Open Heart Surgery;  Laterality: N/A;  . Colonoscopy  08/2013  . Left and right heart catheterization with coronary angiogram N/A 08/11/2011    Procedure: LEFT AND RIGHT HEART CATHETERIZATION WITH CORONARY ANGIOGRAM;  Surgeon: Larey Dresser, MD;  Location[facility] CATH LAB;  Service: Cardiovascular;  Laterality: N/A;  . Permanent pacemaker insertion N/A 01/29/2012    Procedure: PERMANENT PACEMAKER INSERTION;  Surgeon: Deboraha Sprang, MD;  Location: [facility] CATH LAB;  Service: Cardiovascular;  Laterality: N/A;     Current Outpatient Prescriptions  Medication Sig Dispense Refill  . albuterol (PROVENTIL HFA;VENTOLIN HFA) 108 (90 BASE) MCG/ACT inhaler Inhale 2 puffs into the lungs every 6 (six) hours as needed for wheezing or shortness of breath. 1 Inhaler 0  . ammonium lactate (AMLACTIN) 12 % lotion Apply 1 application  topically as needed for dry skin. 225 g 0  . aspirin 81 MG EC tablet Take 1 tablet (81 mg total) by mouth daily. 30 tablet 3  . atorvastatin (LIPITOR) 40 MG tablet Take 1 tablet (40 mg total) by mouth daily. 90 tablet 1  . Blood Glucose Monitoring Suppl (ONE TOUCH ULTRA MINI) W/DEVICE KIT USE AS DIRECTED TO TEST BLOOD GLUCOSE ONCE DAILY DX: 250.00 1 each 0  . carvedilol (COREG) 25 MG tablet Take 12.5 mg by mouth 2 (two) times daily after a meal.    . Cholecalciferol (VITAMIN D-3) 1000 UNITS CAPS Take 1 capsule by mouth daily.    . clonazePAM (KLONOPIN) 0.5 MG tablet Take 0.5 mg by mouth at bedtime as needed for anxiety.    Marland Kitchen  fluticasone (FLONASE) 50 MCG/ACT nasal spray Place 2 sprays into both nostrils daily. 16 g 0  . glipiZIDE (GLUCOTROL XL) 10 MG 24 hr tablet Take 1/2 tablet by mouth daily. 45 tablet 1  . glucose blood test strip Use as instructed to test blood glucose once daily Dx: E11.9 100 each 12  . HYDROcodone-acetaminophen (NORCO) 5-325 MG per tablet Take 1-2 tablets by mouth every 6 (six) hours as needed. 15 tablet 0  . levothyroxine (SYNTHROID, LEVOTHROID) 50 MCG tablet TAKE ONE TABLET BY MOUTH ONCE DAILY BEFORE BREAKFAST 90 tablet 0  . polyethylene glycol powder (GLYCOLAX/MIRALAX) powder MIX ONE CAPFUL IN LIQUID AND DRINK ONCE DAILY 765 g 3   No current facility-administered medications for this visit.    Allergies:   Keflex; Metformin; Promethazine hcl; and Diflucan    Social History:  The patient  reports that she has never smoked. She has never used smokeless tobacco. She reports that she does not drink alcohol or use illicit drugs.   Family History:  The patient's family history includes Diabetes in her father and son; Hypertension in her son; Prostate cancer in her son; Stroke in her father and mother. There is no history of Thyroid disease or Heart attack.    ROS:   Please see the history of present illness.   Review of Systems  All other systems reviewed and are  negative.     PHYSICAL EXAM: VS:  BP 132/60 mmHg  Pulse 72  Ht '5\' 1"'  (1.549 m)  Wt 99 lb (44.906 kg)  BMI 18.72 kg/m2    Wt Readings from Last 3 Encounters:  12/07/14 99 lb (44.906 kg)  08/27/14 105 lb (47.628 kg)  08/25/14 105 lb 6 oz (47.798 kg)     GEN: Well nourished, well developed, in no acute distress HEENT: normal Neck: no JVD,   no masses Cardiac:  Normal S1/S2, RRR; no murmur ,  no rubs or gallops, no edema   Respiratory:  clear to auscultation bilaterally, no wheezing, rhonchi or rales. GI: soft, nontender, nondistended, + BS MS: no deformity or atrophy Skin: warm and dry  Neuro:  CNs II-XII intact, Strength and sensation are intact Psych: Normal affect   EKG:  EKG is ordered today.  It demonstrates:   A paced, HR 72, LBBB no changes   Recent Labs: 12/08/2013: TSH 0.66 03/11/2014: ALT 12 03/13/2014: Hemoglobin 10.0*; Platelets 218 09/18/2014: BUN 31*; Creatinine, Ser 1.19; Potassium 4.4; Sodium 139    Lipid Panel    Component Value Date/Time   CHOL 126 12/11/2013 1033   TRIG 171.0* 12/11/2013 1033   HDL 39.50 12/11/2013 1033   CHOLHDL 3 12/11/2013 1033   VLDL 34.2 12/11/2013 1033   LDLCALC 52 12/11/2013 1033      ASSESSMENT AND PLAN:  1. Aortic Stenosis s/p AVR: She is doing well. AVR was okay at echocardiogram in 07/2013. 2. CAD s/p CABG: No angina. Continue aspirin, statin. 3. Bilateral Carotid Stenosis:   She is due for Carotid US.  Will arrange.  4. Hyperlipidemia: Continue statin. She will get her PCP to check Lipids and LFTs at some point in the next 6 mos.  5. Hypertension: Controlled.  6. CHB s/p PPM: FU with EP as planned.  7. Chronic Diastolic CHF: Volume stable.   8. Chronic Kidney Disease: She sees Nephrology (Dr. Marval Regal).  9. Dementia: FU with PCP.    Medication Changes: Current medicines are reviewed at length with the patient today.  Concerns regarding medicines are as outlined  above.  The  following changes have been made:   Discontinued Medications   CLONAZEPAM (KLONOPIN) 0.5 MG TABLET    Take 1 tablet (0.5 mg total) by mouth at bedtime.   Modified Medications   No medications on file   New Prescriptions   No medications on file    Labs/ tests ordered today include:   Orders Placed This Encounter  Procedures  . EKG 12-Lead      Disposition:    FU with Dr. Loralie Champagne 6 mos.    Signed, Versie Starks, MHS 12/07/2014 12:16 PM    [facility] [place], [place], [place]  [address] Phone: [phone]; Fax: [phone]

## 2014-12-07 ENCOUNTER — Encounter: Payer: Self-pay | Admitting: Physician Assistant

## 2014-12-07 ENCOUNTER — Ambulatory Visit (INDEPENDENT_AMBULATORY_CARE_PROVIDER_SITE_OTHER): Payer: Medicare Other | Admitting: Physician Assistant

## 2014-12-07 VITALS — BP 132/60 | HR 72 | Ht 61.0 in | Wt 99.0 lb

## 2014-12-07 DIAGNOSIS — Z952 Presence of prosthetic heart valve: Secondary | ICD-10-CM

## 2014-12-07 DIAGNOSIS — I2510 Atherosclerotic heart disease of native coronary artery without angina pectoris: Secondary | ICD-10-CM | POA: Diagnosis not present

## 2014-12-07 DIAGNOSIS — Z954 Presence of other heart-valve replacement: Secondary | ICD-10-CM

## 2014-12-07 DIAGNOSIS — R413 Other amnesia: Secondary | ICD-10-CM

## 2014-12-07 DIAGNOSIS — E785 Hyperlipidemia, unspecified: Secondary | ICD-10-CM | POA: Diagnosis not present

## 2014-12-07 DIAGNOSIS — Z950 Presence of cardiac pacemaker: Secondary | ICD-10-CM

## 2014-12-07 DIAGNOSIS — I10 Essential (primary) hypertension: Secondary | ICD-10-CM

## 2014-12-07 DIAGNOSIS — I6523 Occlusion and stenosis of bilateral carotid arteries: Secondary | ICD-10-CM | POA: Diagnosis not present

## 2014-12-07 DIAGNOSIS — I5032 Chronic diastolic (congestive) heart failure: Secondary | ICD-10-CM

## 2014-12-07 DIAGNOSIS — N184 Chronic kidney disease, stage 4 (severe): Secondary | ICD-10-CM

## 2014-12-07 NOTE — Patient Instructions (Signed)
Medication Instructions:  Your physician recommends that you continue on your current medications as directed. Please refer to the Current Medication list given to you today.   Labwork: ASK PCP TO CHECK LIVER FUNCTION AND LIPIDS AT NEXT APPOINTMENT  Testing/Procedures: Your physician has requested that you have a carotid duplex. This test is an ultrasound of the carotid arteries in your neck. It looks at blood flow through these arteries that supply the brain with blood. Allow one hour for this exam. There are no restrictions or special instructions.    Follow-Up: Your physician wants you to follow-up in: [place].  You will receive a reminder letter in the mail two months in advance. If you don't receive a letter, please call our office to schedule the follow-up appointment.   Any Other Special Instructions Will Be Listed Below (If Applicable).

## 2015-01-13 ENCOUNTER — Encounter: Payer: Self-pay | Admitting: *Deleted

## 2015-01-13 ENCOUNTER — Telehealth: Payer: Self-pay | Admitting: Physician Assistant

## 2015-01-13 NOTE — Telephone Encounter (Signed)
Caller name: Anderson Malta RN Case Mgr with [facility] Can be reached: [phone] x [phone]  Reason for call: Anderson Malta is following up on med discrepancy. She is asking if pt should be taking atorvastatin 40mg  and glipizide 10mg . She said there are gaps in pt filling meds and she wants to be sure they are to be taken and at what frequency.

## 2015-01-13 NOTE — Telephone Encounter (Signed)
LMOM with contact name and number RE: Pt medication, per provider VO, patient is to be taking Atorvastatin 40 mg one once daily & Glipizide 10 mg, one-half tablet [5 mg total] daily; also per provider patient is due for a follow-up appointment/SLS

## 2015-01-25 ENCOUNTER — Ambulatory Visit (HOSPITAL_COMMUNITY)
Admission: RE | Admit: 2015-01-25 | Discharge: 2015-01-25 | Disposition: A | Discharge status: Home / Self Care | Payer: Medicare Other | Source: Ambulatory Visit | Attending: Cardiovascular Disease | Admitting: Cardiovascular Disease

## 2015-01-25 DIAGNOSIS — E119 Type 2 diabetes mellitus without complications: Secondary | ICD-10-CM | POA: Diagnosis not present

## 2015-01-25 DIAGNOSIS — E7800 Pure hypercholesterolemia, unspecified: Secondary | ICD-10-CM | POA: Diagnosis not present

## 2015-01-25 DIAGNOSIS — I129 Hypertensive chronic kidney disease with stage 1 through stage 4 chronic kidney disease, or unspecified chronic kidney disease: Secondary | ICD-10-CM | POA: Diagnosis not present

## 2015-01-25 DIAGNOSIS — I6523 Occlusion and stenosis of bilateral carotid arteries: Secondary | ICD-10-CM | POA: Insufficient documentation

## 2015-01-25 DIAGNOSIS — N184 Chronic kidney disease, stage 4 (severe): Secondary | ICD-10-CM | POA: Diagnosis not present

## 2015-01-26 ENCOUNTER — Encounter: Payer: Self-pay | Admitting: Physician Assistant

## 2015-01-26 ENCOUNTER — Telehealth: Payer: Self-pay | Admitting: *Deleted

## 2015-01-26 ENCOUNTER — Other Ambulatory Visit: Payer: Self-pay | Admitting: *Deleted

## 2015-01-26 DIAGNOSIS — I6523 Occlusion and stenosis of bilateral carotid arteries: Secondary | ICD-10-CM

## 2015-01-26 NOTE — Telephone Encounter (Signed)
DPR on file for son Barbara Garner who has been notified of carotid results and findings by phone with verbal understanding. Son aware we will repeat carotid in 1 yr.

## 2015-01-30 ENCOUNTER — Other Ambulatory Visit: Payer: Self-pay | Admitting: Physician Assistant

## 2015-02-24 ENCOUNTER — Encounter: Payer: Self-pay | Admitting: Physician Assistant

## 2015-02-24 ENCOUNTER — Ambulatory Visit (INDEPENDENT_AMBULATORY_CARE_PROVIDER_SITE_OTHER): Payer: Medicare Other

## 2015-02-24 ENCOUNTER — Ambulatory Visit (INDEPENDENT_AMBULATORY_CARE_PROVIDER_SITE_OTHER): Payer: Medicare Other | Admitting: Physician Assistant

## 2015-02-24 VITALS — BP 110/50 | HR 93 | Temp 97.4°F | Ht 61.0 in | Wt 100.4 lb

## 2015-02-24 DIAGNOSIS — Z23 Encounter for immunization: Secondary | ICD-10-CM | POA: Diagnosis not present

## 2015-02-24 DIAGNOSIS — E1142 Type 2 diabetes mellitus with diabetic polyneuropathy: Secondary | ICD-10-CM | POA: Diagnosis not present

## 2015-02-24 DIAGNOSIS — Z0000 Encounter for general adult medical examination without abnormal findings: Secondary | ICD-10-CM

## 2015-02-24 DIAGNOSIS — N184 Chronic kidney disease, stage 4 (severe): Secondary | ICD-10-CM

## 2015-02-24 DIAGNOSIS — I5032 Chronic diastolic (congestive) heart failure: Secondary | ICD-10-CM

## 2015-02-24 DIAGNOSIS — Z780 Asymptomatic menopausal state: Secondary | ICD-10-CM | POA: Diagnosis not present

## 2015-02-24 LAB — BASIC METABOLIC PANEL
BUN: 35 mg/dL — AB (ref 6–23)
CO2: 30 meq/L (ref 19–32)
Calcium: 9.1 mg/dL (ref 8.4–10.5)
Chloride: 100 mEq/L (ref 96–112)
Creatinine, Ser: 1.11 mg/dL (ref 0.40–1.20)
GFR: 50.41 mL/min — ABNORMAL LOW (ref >60.00)
GLUCOSE: 155 mg/dL — AB (ref 70–99)
POTASSIUM: 4.3 meq/L (ref 3.5–5.1)
SODIUM: 137 meq/L (ref 135–145)

## 2015-02-24 LAB — LIPID PANEL
Cholesterol: 165 mg/dL (ref 0–200)
HDL: 40.5 mg/dL (ref >39.00)
LDL Cholesterol: 89 mg/dL (ref 0–99)
NonHDL: 124.72
Total CHOL/HDL Ratio: 4
Triglycerides: 178 mg/dL — ABNORMAL HIGH (ref 0.0–149.0)
VLDL: 35.6 mg/dL (ref 0.0–40.0)

## 2015-02-24 LAB — MICROALBUMIN / CREATININE URINE RATIO
CREATININE, U: 88 mg/dL
MICROALB/CREAT RATIO: 50.9 mg/g — AB (ref 0.0–30.0)
Microalb, Ur: 44.8 mg/dL — ABNORMAL HIGH (ref 0.0–1.9)

## 2015-02-24 LAB — HEMOGLOBIN A1C: Hgb A1c MFr Bld: 6.6 % — ABNORMAL HIGH (ref 4.6–6.5)

## 2015-02-24 MED ORDER — ZOSTER VACCINE LIVE 19400 UNT/0.65ML ~~LOC~~ SOLR: 0.6500 mL | Freq: Once | SUBCUTANEOUS | Status: DC

## 2015-02-24 NOTE — Progress Notes (Signed)
Subjective:   Barbara Garner is a 80 y.o. female who presents for an Initial Medicare Annual Wellness Visit after follow up visit with PCP.    Review of Systems: No ROS  Cardiac Risk Factors include: advanced age (>41mn, >>81women);diabetes mellitus;hypertension;sedentary lifestyle Sleep patterns:  Sleeps at least 8 hours per night/wakes up at least 1-2 times at night to void.   Home Safety/Smoke Alarms: Feels safe at home.  Lives with son and family.  Smoke alarms.  Security cameras and dog.   Firearm Safety: No firearms.   Seat Belt Safety/Bike Helmet:  Always wears seat belt.   Sun exposure: limited sun exposure.    Counseling:   Eye Exam- Years ago.   Dental- Has dentures.  Fit appropriately.   Female:  Pap-Hysterectomy      Mammo-not a candidate due to age      Dexa scan- ordered    CCS-not a candidate due to age.         Objective:    Today's Vitals   02/24/15 1616  BP: 110/50  Pulse: 93  Temp: 97.4 F (36.3 C)  TempSrc: Oral  Height: '5\' 1"'  (1.549 m)  Weight: 100 lb 6.4 oz (45.541 kg)  SpO2: 96%  PainSc: 0-No pain    Current Medications (verified) Outpatient Encounter Prescriptions as of 02/24/2015  Medication Sig  . albuterol (PROVENTIL HFA;VENTOLIN HFA) 108 (90 BASE) MCG/ACT inhaler Inhale 2 puffs into the lungs every 6 (six) hours as needed for wheezing or shortness of breath.  .Marland Kitchenammonium lactate (AMLACTIN) 12 % lotion Apply 1 application topically as needed for dry skin.  .Marland Kitchenaspirin 81 MG EC tablet Take 1 tablet (81 mg total) by mouth daily.  .Marland Kitchenatorvastatin (LIPITOR) 40 MG tablet Take 1 tablet (40 mg total) by mouth daily.  . Blood Glucose Monitoring Suppl (ONE TOUCH ULTRA MINI) W/DEVICE KIT USE AS DIRECTED TO TEST BLOOD GLUCOSE ONCE DAILY DX: 250.00  . carvedilol (COREG) 25 MG tablet Take 12.5 mg by mouth 2 (two) times daily after a meal.  . Cholecalciferol (VITAMIN D-3) 1000 UNITS CAPS Take 1 capsule by mouth daily.  . clonazePAM (KLONOPIN) 0.5 MG tablet Take  0.5 mg by mouth at bedtime as needed for anxiety.  . fluticasone (FLONASE) 50 MCG/ACT nasal spray Place 2 sprays into both nostrils daily.  .Marland KitchenglipiZIDE (GLUCOTROL XL) 10 MG 24 hr tablet Take 1/2 tablet by mouth daily.  .Marland Kitchenglucose blood test strip Use as instructed to test blood glucose once daily Dx: E11.9  . HYDROcodone-acetaminophen (NORCO) 5-325 MG per tablet Take 1-2 tablets by mouth every 6 (six) hours as needed.  .Marland Kitchenlevothyroxine (SYNTHROID, LEVOTHROID) 50 MCG tablet TAKE ONE TABLET BY MOUTH ONCE DAILY BEFORE BREAKFAST  . polyethylene glycol powder (GLYCOLAX/MIRALAX) powder MIX ONE CAPFUL IN LIQUID AND DRINK ONCE DAILY  . zoster vaccine live, PF, (ZOSTAVAX) [id number]/0.65ML injection Inject 19,400 Units into the skin once.  . [DISCONTINUED] zoster vaccine live, PF, (ZOSTAVAX) [id number]/0.65ML injection Inject 19,400 Units into the skin once.   No facility-administered encounter medications on file as of 02/24/2015.    Allergies (verified) Keflex; Metformin; Promethazine hcl; and Diflucan   History: Past Medical History  Diagnosis Date  . Iron deficiency anemia, unspecified   . Chronic diastolic CHF (congestive heart failure) ([place]   . CAD  S/P CABG x 3 08/2011     LIMA to diagonal branch, SVG to OM1, SVG to PDA, EVH via bilateral thighs  . Type II  or unspecified type diabetes mellitus without mention of complication, not stated as uncontrolled   . Contact dermatitis and other eczema, due to unspecified cause   . Esophageal reflux   . Headache(784.0)   . Pacemaker MDT dual chamber     DOI 08/2011  . Pure hypercholesterolemia   . hypertension   . Hypothyroidism   . Atrioventricular block, complete -intermittent   . Memory loss   . Idiopathic peripheral neuropathy   . LBBB (left bundle branch block)   . Carotid stenosis     a. Carotid US 1/09 with 32% LICA stenosis;  b. Carotid US 11/16:  Bilateral ICA 40-59% >> FU 1 year  . Severe aortic stenosis -S/P AVR      19 mm [facility] Ease pericardial tissue valve  . Extrinsic asthma, unspecified     hasn't used inhaler in past year  . CKD (chronic kidney disease) stage 4, GFR 15-29 ml/min (HCC)     Cr 1.6 in 6/11, sees Dr. Arty Baumgartner  . Impaired vision     pt. reports that she identifies her meds by looking at the pillls, she is not able to read labels on bottles   Past Surgical History  Procedure Laterality Date  . Ptca  [phone]    5 blockages  . Cardiac catheterization    . Esophageal dilation  1997  . Colonoscopy  04/1999    1 polyp  . Esophagogastroduodenoscopy  04/1999    HH; "watermelon stomach" (severe gastritis)  . Dexa  10/2003 ,09/2005    osteoporosis,  Osteopenia  . Cataract extraction, bilateral  2003  . Esophagogastroduodenoscopy  04/2002    Gastritis  . Cardiovascular stress test  10/2002    normal (per patient)  . Abdominal hysterectomy  1977    partial; fibroids  . Bladder tack  1977  . Colonoscopy  06/2004    Neg. Int hem  . Carotid dopplers  2006  . Refractive surgery  2006  . Opthy  11/1998;12/01;11/02  . Aortic valve replacement  09/06/2011    Procedure: AORTIC VALVE REPLACEMENT (AVR);  Surgeon: Rexene Alberts, MD;  Location: [place];  Service: Open Heart Surgery;  Laterality: N/A;  . Coronary artery bypass graft  09/06/2011    Procedure: CORONARY ARTERY BYPASS GRAFTING (CABG);  Surgeon: Rexene Alberts, MD;  Location: [place];  Service: Open Heart Surgery;  Laterality: N/A;  . Colonoscopy  08/2013  . Left and right heart catheterization with coronary angiogram N/A 08/11/2011    Procedure: LEFT AND RIGHT HEART CATHETERIZATION WITH CORONARY ANGIOGRAM;  Surgeon: Larey Dresser, MD;  Location: [facility] CATH LAB;  Service: Cardiovascular;  Laterality: N/A;  . Permanent pacemaker insertion N/A 2020-06-2211    Procedure: PERMANENT PACEMAKER INSERTION;  Surgeon: Deboraha Sprang, MD;  Location: [facility] CATH LAB;  Service: Cardiovascular;  Laterality: N/A;   Family History  Problem Relation Age of Onset  .  Stroke Father     died in his 38's  . Stroke Mother     died in her 70's  . Diabetes Father   . Prostate cancer Son   . Diabetes Son     #2  . Hypertension Son     #3  . Thyroid disease Neg Hx   . Heart attack Neg Hx    Social History   Occupational History  . Retired    Social History Main Topics  . Smoking status: Never Smoker   . Smokeless tobacco: Never Used  . Alcohol  Use: No  . Drug Use: No  . Sexual Activity: No    Tobacco Counseling Counseling given: No   Activities of Daily Living In your present state of health, do you have any difficulty performing the following activities: 02/24/2015 03/11/2014  Hearing? N N  Vision? Y N  Difficulty concentrating or making decisions? N N  Walking or climbing stairs? N N  Dressing or bathing? N N  Doing errands, shopping? Tempie Donning  Preparing Food and eating ? Y -  Using the Toilet? N -  In the past six months, have you accidently leaked urine? N -  Do you have problems with loss of bowel control? N -  Managing your Medications? Y -  Managing your Finances? Y -  Housekeeping or managing your Housekeeping? Y -    Immunizations and Health Maintenance Immunization History  Administered Date(s) Administered  . Influenza Split 05/11/2011  . Influenza Whole 12/18/2005, 01/20/2009, 01/05/2010  . Influenza,inj,Quad PF,36+ Mos 11/26/2012, 03/12/2014, 02/24/2015  . Pneumococcal Polysaccharide-23 08/27/2008  . Td 05/18/2005   Health Maintenance Due  Topic Date Due  . ZOSTAVAX  04/15/1996  . DEXA SCAN  04/15/2001  . DTaP/Tdap/Td (1 - Tdap) 05/19/2005  . PNA vac Low Risk Adult (2 of 2 - PCV13) 08/27/2009  . OPHTHALMOLOGY EXAM  03/01/2012  . FOOT EXAM  11/26/2013  . INFLUENZA VACCINE  10/19/2014  . HEMOGLOBIN A1C  02/24/2015    Patient Care Team: [name], PA-C as PCP - General (Physician Assistant) Blanchie Serve, MD as Attending Physician (Internal Medicine)  Indicate any recent Medical Services you may have  received from other than [facility] providers in the past year (date may be approximate).     Assessment:   This is a routine wellness examination for Barbara Garner.   Hearing/Vision screen Hearing Screening Comments: No hearing difficulties. Able to hear conversational tones.   Vision Screening Comments: Vision is impaired.  Unable to read letter on vision chart. Eye referral placed.    Dietary issues and exercise activities discussed: Current Exercise Habits:: The patient does not participate in regular exercise at present (Goes up and down steps to let dog out.  )   Diet:  Eats 3 meals per day.  Mostly healthy meals.  Daughter in law cooks.  Goals    . Follow up with specialist as directed.       Depression Screen PHQ 2/9 Scores 02/24/2015 05/25/2014  PHQ - 2 Score 0 0    Fall Risk Fall Risk  02/24/2015  Falls in the past year? Yes  Number falls in past yr: 1  Injury with Fall? No    Cognitive Function: MMSE - Mini Mental State Exam 02/24/2015  Orientation to time 0  Registration 3  Attention/ Calculation 5  Recall 0  Language- name 2 objects 2  Language- repeat 1  Language- follow 3 step command 3  Language- read & follow direction 0  Language-read & follow direction-comments unable to see   Write a sentence 0  Write a sentence-comments unable to see  Copy design (No Data)  Copy design-comments unable to see    Screening Tests Health Maintenance  Topic Date Due  . ZOSTAVAX  04/15/1996  . DEXA SCAN  04/15/2001  . DTaP/Tdap/Td (1 - Tdap) 05/19/2005  . PNA vac Low Risk Adult (2 of 2 - PCV13) 08/27/2009  . OPHTHALMOLOGY EXAM  03/01/2012  . FOOT EXAM  11/26/2013  . INFLUENZA VACCINE  10/19/2014  . HEMOGLOBIN A1C  02/24/2015  .  TETANUS/TDAP  05/19/2015      Plan:  Follow-up with specialists as directed.  During the course of the visit, Barbara Garner was educated and counseled about the following appropriate screening and preventive services:   Vaccines to include  Pneumococcal, Influenza, Hepatitis B, Td, Zostavax, HCV  Electrocardiogram  Cardiovascular disease screening  Colorectal cancer screening  Bone density screening  Diabetes screening  Glaucoma screening  Mammography/PAP  Nutrition counseling  Smoking cessation counseling  Patient Instructions (the written plan) were given to the patient.    Rudene Anda, RN   02/24/2015

## 2015-02-24 NOTE — Progress Notes (Signed)
Pre visit review using our clinic review tool, if applicable. No additional management support is needed unless otherwise documented below in the visit note. 

## 2015-02-24 NOTE — Patient Instructions (Signed)
No new instructions given apart from instructions given by PCP.

## 2015-02-24 NOTE — Patient Instructions (Signed)
Please go to the lab for blood work. Continue medications as directed. Follow-up with specialists as directed.   Please see Ashlee for wellness visit.

## 2015-02-24 NOTE — Progress Notes (Signed)
Reviewed and agree.   [name], PA-C

## 2015-03-03 ENCOUNTER — Ambulatory Visit: Payer: Medicare Other | Admitting: Podiatry

## 2015-03-04 ENCOUNTER — Telehealth: Payer: Self-pay | Admitting: *Deleted

## 2015-03-04 NOTE — Telephone Encounter (Signed)
-----   Message from [name], PA-C sent at 03/01/2015  1:12 PM EST ----- Kidney function stable. A1C looks great. Continue diabetes medications as directed. Diabetes is affecting kidneys. Would like to start a very low-dose ramipril daily to help protect her kidneys. Ok to send in Rx Ramipril 2.5 mg to take daily. Quantity 30 with 1 refill. Follow-up 1 month to check BP.

## 2015-03-04 NOTE — Telephone Encounter (Signed)
Called and spoke with the pt's son and informed him of the pt's recent lab results and note. He verbalized understanding. He stated that the pt sees a kidney doctor and he would like to hold off on starting the new medication (Ramipril) until after the pt sees the kidney doctor. He stated that the pt has an appt with the kidney doctor next month. He also stated that he will have his wife call back to schedule the 1 month follow-up to check BP.//AB/CMA

## 2015-03-08 ENCOUNTER — Ambulatory Visit (INDEPENDENT_AMBULATORY_CARE_PROVIDER_SITE_OTHER): Payer: Medicare Other | Admitting: Podiatry

## 2015-03-08 ENCOUNTER — Encounter: Payer: Self-pay | Admitting: Podiatry

## 2015-03-08 VITALS — Ht 61.0 in | Wt 100.0 lb

## 2015-03-08 DIAGNOSIS — M79604 Pain in right leg: Secondary | ICD-10-CM | POA: Diagnosis not present

## 2015-03-08 DIAGNOSIS — B351 Tinea unguium: Secondary | ICD-10-CM | POA: Insufficient documentation

## 2015-03-08 DIAGNOSIS — M79606 Pain in leg, unspecified: Secondary | ICD-10-CM | POA: Diagnosis not present

## 2015-03-08 NOTE — Patient Instructions (Signed)
Seen for hypertrophic nails. All nails debrided. Return in 3 months or as needed.  

## 2015-03-08 NOTE — Progress Notes (Signed)
Patient presents to clinic today for 24-monthfollow-up of diabetes mellitus II, previously controlled.  Patient currently on Glipizide XL 10 mg daily. Is not checking sugars. Is watching diet which is monitored by son and daughter-in-law. Last A1C at 6.8 on 08/25/14. Has an ophthalmologist but has not followed up for eye exam. Daughter will schedule. Is overdue for foot examination. Patient does note long nails and difficulty cutting them. Denies sores or numbness of feet.  Patient also overdue for bone density scan. Patient is postmenopausal. Is not currently on supplementation. Daughter is unsure of when last scan was but has not had in > 5 years.  Past Medical History  Diagnosis Date  . Iron deficiency anemia, unspecified   . Chronic diastolic CHF (congestive heart failure) ([place]   . CAD  S/P CABG x 3 08/2011     LIMA to diagonal branch, SVG to OM1, SVG to PDA, EVH via bilateral thighs  . Type II or unspecified type diabetes mellitus without mention of complication, not stated as uncontrolled   . Contact dermatitis and other eczema, due to unspecified cause   . Esophageal reflux   . Headache(784.0)   . Pacemaker MDT dual chamber     DOI 08/2011  . Pure hypercholesterolemia   . hypertension   . Hypothyroidism   . Atrioventricular block, complete -intermittent   . Memory loss   . Idiopathic peripheral neuropathy   . LBBB (left bundle branch block)   . Carotid stenosis     a. Carotid [place] 409%LICA stenosis;  b. Carotid [place]16:  Bilateral ICA 40-59% >> FU 1 year  . Severe aortic stenosis -S/P AVR      [facility]  . Extrinsic asthma, unspecified     hasn't used inhaler in past year  . CKD (chronic kidney disease) stage 4, GFR 15-29 ml/min (HCC)     Cr 1.6 in 6/11, sees Dr. CArty Baumgartner . Impaired vision     pt. reports that she identifies her meds by looking at the pillls, she is not able to read labels on bottles    Current Outpatient  Prescriptions on File Prior to Visit  Medication Sig Dispense Refill  . albuterol (PROVENTIL HFA;VENTOLIN HFA) 108 (90 BASE) MCG/ACT inhaler Inhale 2 puffs into the lungs every 6 (six) hours as needed for wheezing or shortness of breath. 1 Inhaler 0  . ammonium lactate (AMLACTIN) 12 % lotion Apply 1 application topically as needed for dry skin. 225 g 0  . aspirin 81 MG EC tablet Take 1 tablet (81 mg total) by mouth daily. 30 tablet 3  . atorvastatin (LIPITOR) 40 MG tablet Take 1 tablet (40 mg total) by mouth daily. 90 tablet 1  . Blood Glucose Monitoring Suppl (ONE TOUCH ULTRA MINI) W/DEVICE KIT USE AS DIRECTED TO TEST BLOOD GLUCOSE ONCE DAILY DX: 250.00 1 each 0  . carvedilol (COREG) 25 MG tablet Take 12.5 mg by mouth 2 (two) times daily after a meal.    . Cholecalciferol (VITAMIN D-3) 1000 UNITS CAPS Take 1 capsule by mouth daily.    . clonazePAM (KLONOPIN) 0.5 MG tablet Take 0.5 mg by mouth at bedtime as needed for anxiety.    . fluticasone (FLONASE) 50 MCG/ACT nasal spray Place 2 sprays into both nostrils daily. 16 g 0  . glipiZIDE (GLUCOTROL XL) 10 MG 24 hr tablet Take 1/2 tablet by mouth daily. 45 tablet 1  . glucose blood test strip  Use as instructed to test blood glucose once daily Dx: E11.9 100 each 12  . HYDROcodone-acetaminophen (NORCO) 5-325 MG per tablet Take 1-2 tablets by mouth every 6 (six) hours as needed. 15 tablet 0  . levothyroxine (SYNTHROID, LEVOTHROID) 50 MCG tablet TAKE ONE TABLET BY MOUTH ONCE DAILY BEFORE BREAKFAST 90 tablet 0  . polyethylene glycol powder (GLYCOLAX/MIRALAX) powder MIX ONE CAPFUL IN LIQUID AND DRINK ONCE DAILY 765 g 3   No current facility-administered medications on file prior to visit.    Allergies  Allergen Reactions  . Keflex [Cephalexin]     rash  . Metformin     REACTION: intolerant  . Promethazine Hcl     REACTION: u/k  . Diflucan [Fluconazole] Rash    Renal Failure    Family History  Problem Relation Age of Onset  . Stroke Father       died in his 44's  . Stroke Mother     died in her 24's  . Diabetes Father   . Prostate cancer Son   . Diabetes Son     #2  . Hypertension Son     #3  . Thyroid disease Neg Hx   . Heart attack Neg Hx     Social History   Social History  . Marital Status: Widowed    Spouse Name: N/A  . Number of Children: 3  . Years of Education: N/A   Occupational History  . Retired    Social History Main Topics  . Smoking status: Never Smoker   . Smokeless tobacco: Never Used  . Alcohol Use: No  . Drug Use: No  . Sexual Activity: No   Other Topics Concern  . None   Social History Narrative   Widowed.  Lives with son in [place].            Review of Systems - See HPI.  All other ROS are negative.  BP 110/50 mmHg  Pulse 93  Temp(Src) 97.4 F (36.3 C) (Oral)  Ht _0  (1.549 m)  Wt 100 lb 6.4 oz (45.541 kg)  BMI 18.98 kg/m2  SpO2 96%  Physical Exam  Constitutional: She is oriented to person, place, and time and well-developed, well-nourished, and in no distress.  HENT:  Head: Normocephalic and atraumatic.  Eyes: Conjunctivae are normal.  Cardiovascular: Normal rate, regular rhythm, normal heart sounds and intact distal pulses.   Pulmonary/Chest: Effort normal and breath sounds normal. No respiratory distress. She has no wheezes. She has no rales. She exhibits no tenderness.  Neurological: She is alert and oriented to person, place, and time.  Skin: Skin is warm and dry. No rash noted.  Psychiatric: Affect normal.  Vitals reviewed.  Diabetic Foot Form - Detailed   Diabetic Foot Exam - detailed  Diabetic Foot exam was performed with the following findings:  Yes 02/24/2015  8:07 AM  Visual Foot Exam completed.:  Yes  Is there a history of foot ulcer?:  No  Can the patient see the bottom of their feet?:  Yes  Are the shoes appropriate in style and fit?:  Yes  Is there swelling or and abnormal foot shape?:  No  Are the toenails long?:  Yes  Are the toenails  thick?:  Yes  Do you have pain in calf while walking?:  No  Is there a claw toe deformity?:  No  Is there elevated skin temparature?:  No  Is there limited skin dorsiflexion?:  No  Is there foot or ankle  muscle weakness?:  No  Are the toenails ingrown?:  No  Normal Range of Motion:  Yes    Pulse Foot Exam completed.:  Yes  Right posterior Tibialias:  Present Left posterior Tibialias:  Present  Right Dorsalis Pedis:  Present Left Dorsalis Pedis:  Present  Sensory Foot Exam Completed.:  Yes  Swelling:  No  Semmes-Weinstein Monofilament Test  R Foot Test Control:  Neg L Foot Test Control:  Neg  R Site 1-Great Toe:  Neg L Site 1-Great Toe:  Neg  R Site 4:  Neg L Site 4:  Neg  R Site 5:  Neg L Site 5:  Neg    Comments:  Needs podiatry referral for nail care of diabetic foot.       Recent Results (from the past 2160 hour(s))  Basic Metabolic Panel (BMET)     Status: Abnormal   Collection Time: 02/24/15 10:52 AM  Result Value Ref Range   Sodium 137 135 - 145 mEq/L   Potassium 4.3 3.5 - 5.1 mEq/L   Chloride 100 96 - 112 mEq/L   CO2 30 19 - 32 mEq/L   Glucose, Bld 155 (H) 70 - 99 mg/dL   BUN 35 (H) 6 - 23 mg/dL   Creatinine, Ser 1.11 0.40 - 1.20 mg/dL   Calcium 9.1 8.4 - 10.5 mg/dL   GFR 50.41 (L) >60.00 mL/min  Hemoglobin A1c     Status: Abnormal   Collection Time: 02/24/15 10:52 AM  Result Value Ref Range   Hgb A1c MFr Bld 6.6 (H) 4.6 - 6.5 %    Comment: Glycemic Control Guidelines for People with Diabetes:Non Diabetic:  <6%Goal of Therapy: <7%Additional Action Suggested:  >8%   Urine Microalbumin w/creat. ratio     Status: Abnormal   Collection Time: 02/24/15 10:52 AM  Result Value Ref Range   Microalb, Ur 44.8 (H) 0.0 - 1.9 mg/dL   Creatinine,U 88.0 mg/dL   Microalb Creat Ratio 50.9 (H) 0.0 - 30.0 mg/g  Lipid Profile     Status: Abnormal   Collection Time: 02/24/15 10:52 AM  Result Value Ref Range   Cholesterol 165 0 - 200 mg/dL    Comment: ATP III Classification        Desirable:  < 200 mg/dL               Borderline High:  200 - 239 mg/dL          High:  > = 240 mg/dL   Triglycerides 178.0 (H) 0.0 - 149.0 mg/dL    Comment: Normal:  <150 mg/dLBorderline High:  150 - 199 mg/dL   HDL 40.50 >39.00 mg/dL   VLDL 35.6 0.0 - 40.0 mg/dL   LDL Cholesterol 89 0 - 99 mg/dL   Total CHOL/HDL Ratio 4     Comment:                Men          Women1/2 Average Risk     3.4          3.3Average Risk          5.0          4.42X Average Risk          9.6          7.13X Average Risk          15.0          11.0  NonHDL 124.72     Comment: NOTE:  Non-HDL goal should be 30 mg/dL higher than patient's LDL goal (i.e. LDL goal of < 70 mg/dL, would have non-HDL goal of < 100 mg/dL)    Assessment/Plan: Chronic diastolic CHF (congestive heart failure) Followed by Cardiology. Euvolemic on exam. BP stable. Also with DM, not currently on ACEI. Could not find a reason in cardiology notes and patient poor historian regarding potential previous reaction to ACEI. Will check urine microalbumin and BMP today.   Diabetes mellitus type 2, controlled Will check BMP, A1C and urine microalbumin. Referral to OP placed for insurance purposes. Daughter to help schedule. Foot exam completed. Referral to Podiatry placed for diabetic foot care. Continue current regimen.  Postmenopausal estrogen deficiency Order for bone density placed.

## 2015-03-08 NOTE — Progress Notes (Signed)
SUBJECTIVE: 80 y.o. year old female presents accompanied by her daughter for diabetic foot care. Difficulty managing thick nails.  She has been diabetic most of her life. In good control. Patient is ambulatory without assistance. Patient is referred by Dr. Hassell Done.   REVIEW OF SYSTEMS: A comprehensive review of systems was negative except for: Pace maker and stage IV renal disease.   OBJECTIVE: DERMATOLOGIC EXAMINATION: Nails: Thick dystrophic nail left great toe. Skin Integrity: Thin shiny skin. VASCULAR EXAMINATION OF LOWER LIMBS: Pedal pulses: Pedal pulses are not palpable.  Temperature gradient from tibial crest to dorsum of foot is within normal bilateral. NEUROLOGIC EXAMINATION OF THE LOWER LIMBS: All epicritic and tactile sensations are grossly intact.  MUSCULOSKELETAL EXAMINATION: No gross deformities.   ASSESSMENT: Onychomycosis x 10.  Deformed nail left great toe.  PLAN: All nails debrided. Return in 3 months or as needed.

## 2015-03-10 DIAGNOSIS — Z780 Asymptomatic menopausal state: Secondary | ICD-10-CM | POA: Insufficient documentation

## 2015-03-10 NOTE — Assessment & Plan Note (Signed)
Order for bone density placed 

## 2015-03-10 NOTE — Assessment & Plan Note (Signed)
Will check BMP, A1C and urine microalbumin. Referral to OP placed for insurance purposes. Daughter to help schedule. Foot exam completed. Referral to Podiatry placed for diabetic foot care. Continue current regimen.

## 2015-03-10 NOTE — Assessment & Plan Note (Signed)
Followed by Cardiology. Euvolemic on exam. BP stable. Also with DM, not currently on ACEI. Could not find a reason in cardiology notes and patient poor historian regarding potential previous reaction to ACEI. Will check urine microalbumin and BMP today.

## 2015-04-03 ENCOUNTER — Other Ambulatory Visit: Payer: Self-pay | Admitting: Physician Assistant

## 2015-05-01 ENCOUNTER — Other Ambulatory Visit: Payer: Self-pay | Admitting: Physician Assistant

## 2015-05-05 ENCOUNTER — Other Ambulatory Visit: Payer: Self-pay | Admitting: Physician Assistant

## 2015-05-11 ENCOUNTER — Telehealth: Payer: Self-pay | Admitting: Physician Assistant

## 2015-05-11 MED ORDER — LEVOTHYROXINE SODIUM 50 MCG PO TABS: ORAL_TABLET | ORAL | Status: DC

## 2015-05-11 NOTE — Telephone Encounter (Signed)
Rx sent to the pharmacy by e-script.//AB/CMA 

## 2015-05-11 NOTE — Telephone Encounter (Signed)
LVM advising patient to call back and schedule appointment.

## 2015-05-11 NOTE — Telephone Encounter (Signed)
Patient needs follow-up to reassess TSH levels as noted on previous refills sent in. Ok to send in 15-day supply until she can have labs done.

## 2015-05-11 NOTE — Telephone Encounter (Signed)
Relation to [id number] Call back number:[phone] Pharmacy: [facility] [place], [place] [phone] (Fax)         Reason for call:  Patient requesting a refill levothyroxine (SYNTHROID, LEVOTHROID) 50 MCG tablet

## 2015-05-14 ENCOUNTER — Ambulatory Visit (INDEPENDENT_AMBULATORY_CARE_PROVIDER_SITE_OTHER): Payer: Medicare Other | Admitting: Physician Assistant

## 2015-05-14 ENCOUNTER — Encounter: Payer: Self-pay | Admitting: Physician Assistant

## 2015-05-14 VITALS — BP 120/70 | HR 91 | Temp 97.6°F | Ht 61.0 in | Wt 107.0 lb

## 2015-05-14 DIAGNOSIS — E1142 Type 2 diabetes mellitus with diabetic polyneuropathy: Secondary | ICD-10-CM

## 2015-05-14 DIAGNOSIS — E039 Hypothyroidism, unspecified: Secondary | ICD-10-CM | POA: Diagnosis not present

## 2015-05-14 LAB — TSH: TSH: 10.14 u[IU]/mL — AB (ref 0.35–4.50)

## 2015-05-14 MED ORDER — RAMIPRIL 1.25 MG PO CAPS: 1.2500 mg | ORAL_CAPSULE | Freq: Every day | ORAL | Status: DC

## 2015-05-14 NOTE — Progress Notes (Signed)
Pre visit review using our clinic review tool, if applicable. No additional management support is needed unless otherwise documented below in the visit note. 

## 2015-05-14 NOTE — Assessment & Plan Note (Signed)
Will begin ramipril 1.25 mg daily.

## 2015-05-14 NOTE — Progress Notes (Signed)
Patient presents to clinic today for follow-up of hypothyroidism. Is currently prescribed levothyroxine 50 mcg daily. Endorses taking as directed without side effects. Is due for repeat TSH testing to ensure appropriate dose.  Of not on last visit, urine microalbumin checked with moderate elevation. Patient was too start on ramipril but daughter wanted to follow-up with nephrology first. They are agreeable to start medication now.  Past Medical History  Diagnosis Date  . Iron deficiency anemia, unspecified   . Chronic diastolic CHF (congestive heart failure) ([place])   . CAD  S/P CABG x 3 08/2011     LIMA to diagonal branch, SVG to OM1, SVG to PDA, EVH via bilateral thighs  . Type II or unspecified type diabetes mellitus without mention of complication, not stated as uncontrolled   . Contact dermatitis and other eczema, due to unspecified cause   . Esophageal reflux   . Headache(784.0)   . Pacemaker MDT dual chamber     DOI 08/2011  . Pure hypercholesterolemia   . hypertension   . Hypothyroidism   . Atrioventricular block, complete -intermittent   . Memory loss   . Idiopathic peripheral neuropathy   . LBBB (left bundle branch block)   . Carotid stenosis     a. Carotid US 3/66 with 29% LICA stenosis;  b. Carotid US 11/16:  Bilateral ICA 40-59% >> FU 1 year  . Severe aortic stenosis -S/P AVR      19 mm [facility] Ease pericardial tissue valve  . Extrinsic asthma, unspecified     hasn't used inhaler in past year  . CKD (chronic kidney disease) stage 4, GFR 15-29 ml/min (HCC)     Cr 1.6 in 6/11, sees Dr. Arty Baumgartner  . Impaired vision     pt. reports that she identifies her meds by looking at the pillls, she is not able to read labels on bottles    Current Outpatient Prescriptions on File Prior to Visit  Medication Sig Dispense Refill  . albuterol (PROVENTIL HFA;VENTOLIN HFA) 108 (90 BASE) MCG/ACT inhaler Inhale 2 puffs into the lungs every 6 (six) hours as needed for wheezing or  shortness of breath. 1 Inhaler 0  . ammonium lactate (AMLACTIN) 12 % lotion Apply 1 application topically as needed for dry skin. 225 g 0  . aspirin 81 MG EC tablet Take 1 tablet (81 mg total) by mouth daily. 30 tablet 3  . atorvastatin (LIPITOR) 40 MG tablet Take 1 tablet (40 mg total) by mouth daily. 90 tablet 1  . Blood Glucose Monitoring Suppl (ONE TOUCH ULTRA MINI) W/DEVICE KIT USE AS DIRECTED TO TEST BLOOD GLUCOSE ONCE DAILY DX: 250.00 1 each 0  . carvedilol (COREG) 25 MG tablet TAKE ONE-HALF TABLET BY MOUTH TWICE DAILY WITH A MEAL 90 tablet 0  . Cholecalciferol (VITAMIN D-3) 1000 UNITS CAPS Take 1 capsule by mouth daily.    . clonazePAM (KLONOPIN) 0.5 MG tablet Take 0.5 mg by mouth at bedtime as needed for anxiety.    . fluticasone (FLONASE) 50 MCG/ACT nasal spray Place 2 sprays into both nostrils daily. 16 g 0  . glipiZIDE (GLUCOTROL XL) 10 MG 24 hr tablet TAKE ONE TABLET BY MOUTH IN THE MORNING 90 tablet 0  . glucose blood test strip Use as instructed to test blood glucose once daily Dx: E11.9 100 each 12  . HYDROcodone-acetaminophen (NORCO) 5-325 MG per tablet Take 1-2 tablets by mouth every 6 (six) hours as needed. 15 tablet 0  . levothyroxine (SYNTHROID, LEVOTHROID) 50  MCG tablet TAKE ONE TABLET BY MOUTH ONCE DAILY BEFORE BREAKFAST 15 tablet 0  . polyethylene glycol powder (GLYCOLAX/MIRALAX) powder MIX ONE CAPFUL IN LIQUID AND DRINK ONCE DAILY 765 g 3   No current facility-administered medications on file prior to visit.    Allergies  Allergen Reactions  . Keflex [Cephalexin]     rash  . Metformin     REACTION: intolerant  . Promethazine Hcl     REACTION: u/k  . Diflucan [Fluconazole] Rash    Renal Failure    Family History  Problem Relation Age of Onset  . Stroke Father     died in his 59's  . Stroke Mother     died in her 12's  . Diabetes Father   . Prostate cancer Son   . Diabetes Son     #2  . Hypertension Son     #3  . Thyroid disease Neg Hx   . Heart  attack Neg Hx     Social History   Social History  . Marital Status: Widowed    Spouse Name: N/A  . Number of Children: 3  . Years of Education: N/A   Occupational History  . Retired    Social History Main Topics  . Smoking status: Never Smoker   . Smokeless tobacco: Never Used  . Alcohol Use: No  . Drug Use: No  . Sexual Activity: No   Other Topics Concern  . None   Social History Narrative   Widowed.  Lives with son in [place].            Review of Systems - See HPI.  All other ROS are negative.  BP 120/70 mmHg  Pulse 91  Temp(Src) 97.6 F (36.4 C) (Oral)  Ht 5' 1" (1.549 m)  Wt 107 lb (48.535 kg)  BMI 20.23 kg/m2  SpO2 97%  Physical Exam  Constitutional: She is oriented to person, place, and time and well-developed, well-nourished, and in no distress.  HENT:  Head: Normocephalic and atraumatic.  Eyes: Conjunctivae are normal.  Neck: Neck supple.  Cardiovascular: Normal rate, regular rhythm, normal heart sounds and intact distal pulses.   Pulmonary/Chest: Effort normal and breath sounds normal. No respiratory distress. She has no wheezes. She has no rales. She exhibits no tenderness.  Neurological: She is alert and oriented to person, place, and time.  Skin: Skin is warm and dry. No rash noted.  Psychiatric: Affect normal.    Recent Results (from the past 2160 hour(s))  Basic Metabolic Panel (BMET)     Status: Abnormal   Collection Time: 02/24/15 10:52 AM  Result Value Ref Range   Sodium 137 135 - 145 mEq/L   Potassium 4.3 3.5 - 5.1 mEq/L   Chloride 100 96 - 112 mEq/L   CO2 30 19 - 32 mEq/L   Glucose, Bld 155 (H) 70 - 99 mg/dL   BUN 35 (H) 6 - 23 mg/dL   Creatinine, Ser 1.11 0.40 - 1.20 mg/dL   Calcium 9.1 8.4 - 10.5 mg/dL   GFR 50.41 (L) >60.00 mL/min  Hemoglobin A1c     Status: Abnormal   Collection Time: 02/24/15 10:52 AM  Result Value Ref Range   Hgb A1c MFr Bld 6.6 (H) 4.6 - 6.5 %    Comment: Glycemic Control Guidelines for People  with Diabetes:Non Diabetic:  <6%Goal of Therapy: <7%Additional Action Suggested:  >8%   Urine Microalbumin w/creat. ratio     Status: Abnormal   Collection Time: 02/24/15 10:52  AM  Result Value Ref Range   Microalb, Ur 44.8 (H) 0.0 - 1.9 mg/dL   Creatinine,U 88.0 mg/dL   Microalb Creat Ratio 50.9 (H) 0.0 - 30.0 mg/g  Lipid Profile     Status: Abnormal   Collection Time: 02/24/15 10:52 AM  Result Value Ref Range   Cholesterol 165 0 - 200 mg/dL    Comment: ATP III Classification       Desirable:  < 200 mg/dL               Borderline High:  200 - 239 mg/dL          High:  > = 240 mg/dL   Triglycerides 178.0 (H) 0.0 - 149.0 mg/dL    Comment: Normal:  <150 mg/dLBorderline High:  150 - 199 mg/dL   HDL 40.50 >39.00 mg/dL   VLDL 35.6 0.0 - 40.0 mg/dL   LDL Cholesterol 89 0 - 99 mg/dL   Total CHOL/HDL Ratio 4     Comment:                Men          Women1/2 Average Risk     3.4          3.3Average Risk          5.0          4.42X Average Risk          9.6          7.13X Average Risk          15.0          11.0                       NonHDL 124.72     Comment: NOTE:  Non-HDL goal should be 30 mg/dL higher than patient's LDL goal (i.e. LDL goal of < 70 mg/dL, would have non-HDL goal of < 100 mg/dL)  TSH     Status: Abnormal   Collection Time: 05/14/15 10:43 AM  Result Value Ref Range   TSH 10.14 (H) 0.35 - 4.50 uIU/mL    Assessment/Plan: Diabetes mellitus type 2, controlled Will begin ramipril 1.25 mg daily.   Thyroid activity decreased Will repeat TSH level today to assess if current medication dose is adequate.

## 2015-05-14 NOTE — Patient Instructions (Signed)
Please go to the lab for blood work. I will call with your results. Continue chronic medications as directed. Start the Ramipril once daily as directed.

## 2015-05-16 NOTE — Assessment & Plan Note (Signed)
Will repeat TSH level today to assess if current medication dose is adequate.

## 2015-05-19 ENCOUNTER — Telehealth: Payer: Self-pay | Admitting: Physician Assistant

## 2015-05-19 MED ORDER — LEVOTHYROXINE SODIUM 50 MCG PO TABS: ORAL_TABLET | ORAL | Status: DC

## 2015-05-19 NOTE — Telephone Encounter (Signed)
Caller name: Urenna Oakland   Relationship to patient: daughter -in law   Can be reached: [phone]  Reason for call: returning Robin's call for lab results.

## 2015-05-19 NOTE — Telephone Encounter (Signed)
Called spoke with daughter. States patient was not taking medication for app 2 weeks because she needed to be seen in office prior to receiving refill.

## 2015-05-19 NOTE — Telephone Encounter (Signed)
Restart medication at previous dose. Have them follow-up in lab in 6 weeks for repeat TSH.

## 2015-05-19 NOTE — Telephone Encounter (Signed)
Lab appointment scheduled for patient. Per daughter-in-law patient will return for TSH testing.

## 2015-05-19 NOTE — Telephone Encounter (Signed)
Medication filled to pharmacy as requested.   

## 2015-05-28 ENCOUNTER — Other Ambulatory Visit: Payer: Self-pay | Admitting: Physician Assistant

## 2015-05-28 MED ORDER — LEVOTHYROXINE SODIUM 50 MCG PO TABS: ORAL_TABLET | ORAL | Status: DC

## 2015-05-28 NOTE — Telephone Encounter (Signed)
Refill sent. No further refills until she follows-up as scheduled for repeat TSH.

## 2015-05-28 NOTE — Telephone Encounter (Signed)
Caller name: Ellajane Eyestone Relationship to patient: Daughter-In-Law Can be reached: [phone] Pharmacy:  [facility] [place], [place] [phone] (Phone) [phone] (Fax)        Reason for call: Refill levothyroxine (SYNTHROID, LEVOTHROID) 50 MCG tablet [id number]

## 2015-06-07 ENCOUNTER — Ambulatory Visit (INDEPENDENT_AMBULATORY_CARE_PROVIDER_SITE_OTHER): Payer: Medicare Other | Admitting: Podiatry

## 2015-06-07 ENCOUNTER — Encounter: Payer: Self-pay | Admitting: Podiatry

## 2015-06-07 DIAGNOSIS — M79606 Pain in leg, unspecified: Secondary | ICD-10-CM | POA: Diagnosis not present

## 2015-06-07 DIAGNOSIS — B351 Tinea unguium: Secondary | ICD-10-CM

## 2015-06-07 NOTE — Progress Notes (Signed)
SUBJECTIVE: 81 y.o. year old female presents accompanied by her daughter for diabetic foot care. Difficulty managing thick nails.  She has been diabetic most of her life. In good control. Patient is ambulatory without assistance.   OBJECTIVE: DERMATOLOGIC EXAMINATION: Nails: Thick dystrophic nail left great toe. Skin Integrity: Thin shiny skin. VASCULAR EXAMINATION OF LOWER LIMBS: Pedal pulses: Pedal pulses are not palpable.  Temperature gradient from tibial crest to dorsum of foot is within normal bilateral. NEUROLOGIC EXAMINATION OF THE LOWER LIMBS: All epicritic and tactile sensations are grossly intact.  MUSCULOSKELETAL EXAMINATION: No gross deformities.   ASSESSMENT: Onychomycosis x 10.  Deformed nail left great toe.  PLAN: All nails debrided. Return in 3 months or as needed.

## 2015-06-07 NOTE — Patient Instructions (Signed)
Seen for hypertrophic nails. All nails debrided. Return in 3 months or as needed.  

## 2015-06-30 ENCOUNTER — Other Ambulatory Visit (INDEPENDENT_AMBULATORY_CARE_PROVIDER_SITE_OTHER): Payer: Medicare Other

## 2015-06-30 DIAGNOSIS — E039 Hypothyroidism, unspecified: Secondary | ICD-10-CM

## 2015-06-30 LAB — TSH: TSH: 2.16 u[IU]/mL (ref 0.35–4.50)

## 2015-07-01 MED ORDER — LEVOTHYROXINE SODIUM 50 MCG PO TABS: ORAL_TABLET | ORAL | Status: DC

## 2015-07-01 NOTE — Addendum Note (Signed)
Addended by: Dorrene German on: 07/01/2015 08:57 AM   Modules accepted: Orders

## 2015-07-29 IMAGING — CR DG CHEST 1V PORT
1 series · 1 of 1 positions shown · non-contrast
Comparison: 08/09/2013

CLINICAL DATA: Shortness of Breath

EXAM:
PORTABLE CHEST - 1 VIEW

[view not recorded]
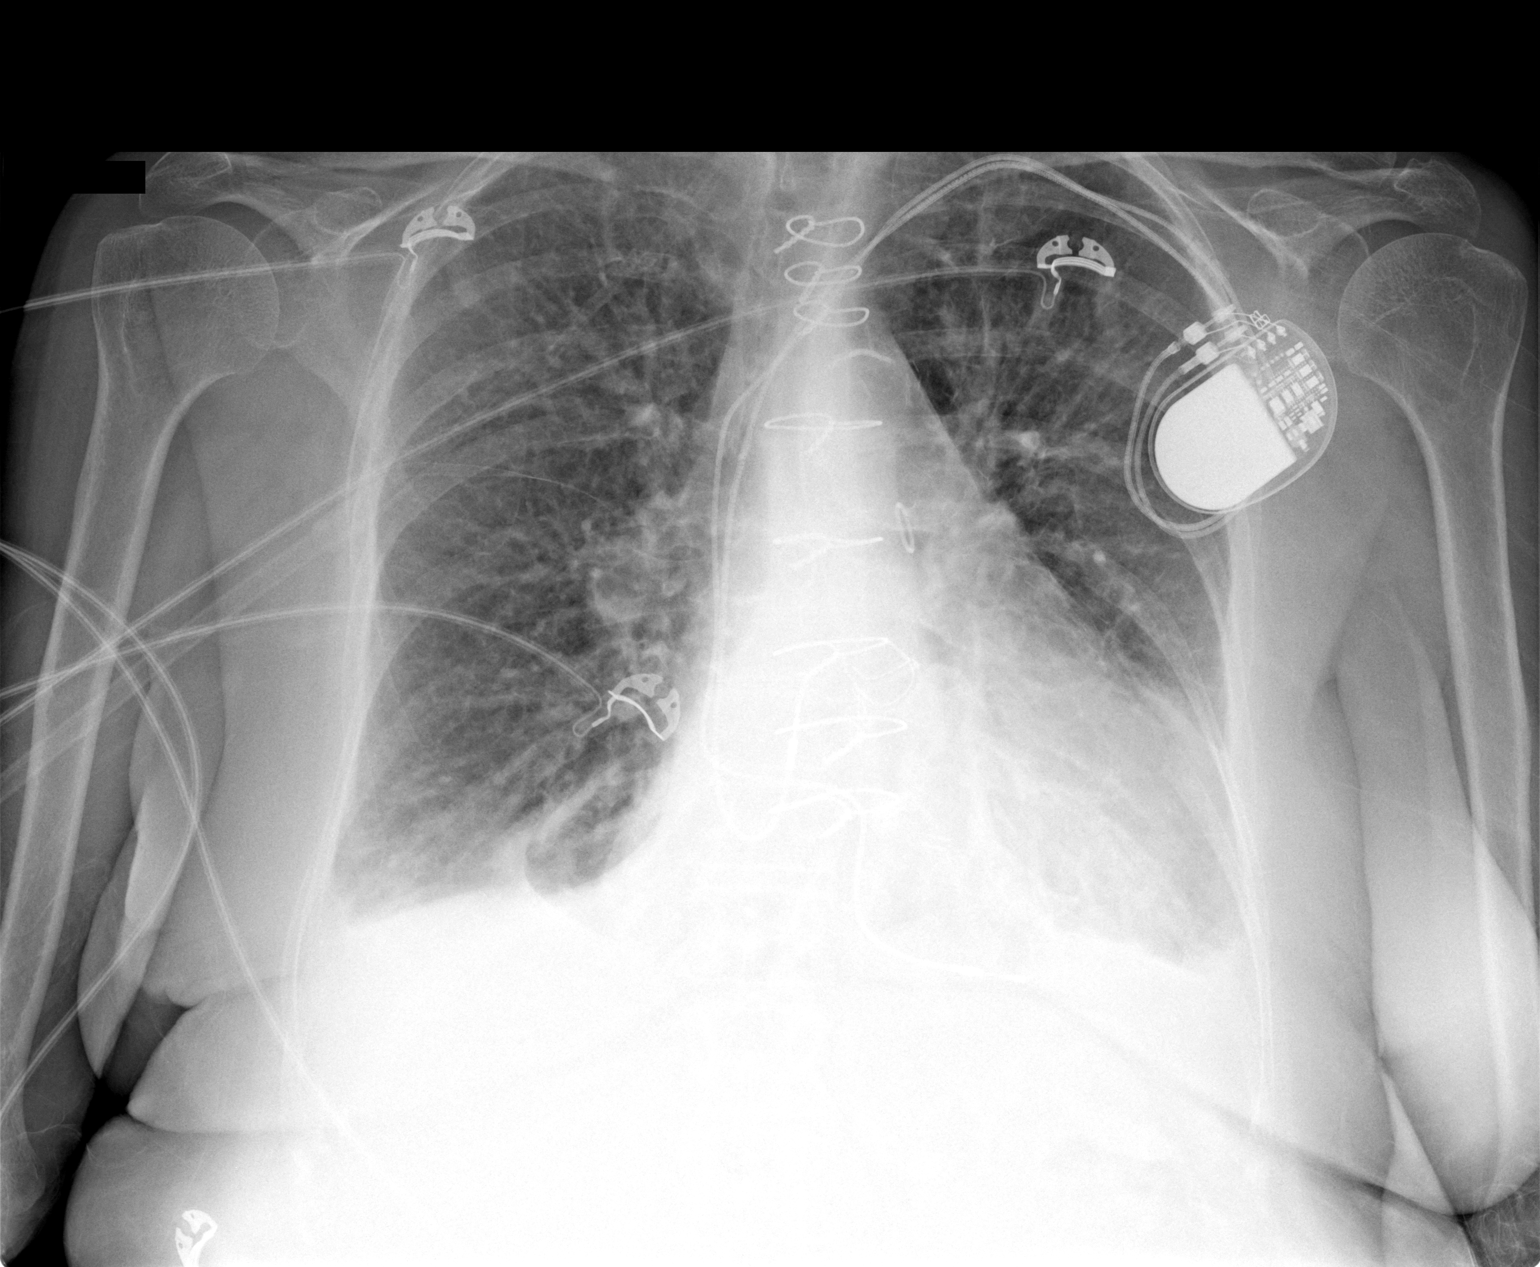

[1 of 1 positions shown; findings below may reference images not displayed]

FINDINGS: Central mild vascular congestion. Cardiomegaly again noted. Dual
lead cardiac pacemaker is unchanged in position. Mild perihilar
interstitial prominence without convincing pulmonary edema.
Bilateral small pleural effusion with bilateral basilar atelectasis
or infiltrate.
IMPRESSION: Cardiomegaly. Central vascular congestion without convincing
pulmonary edema. Bilateral small pleural effusion with bilateral
basilar atelectasis or infiltrate.

## 2015-08-06 IMAGING — CR DG CHEST 2V
2 series · 2 of 2 positions shown · non-contrast
Comparison: 09/16/2013

CLINICAL DATA: Fatigue and headache.

EXAM:
CHEST  2 VIEW

[w chest lat]
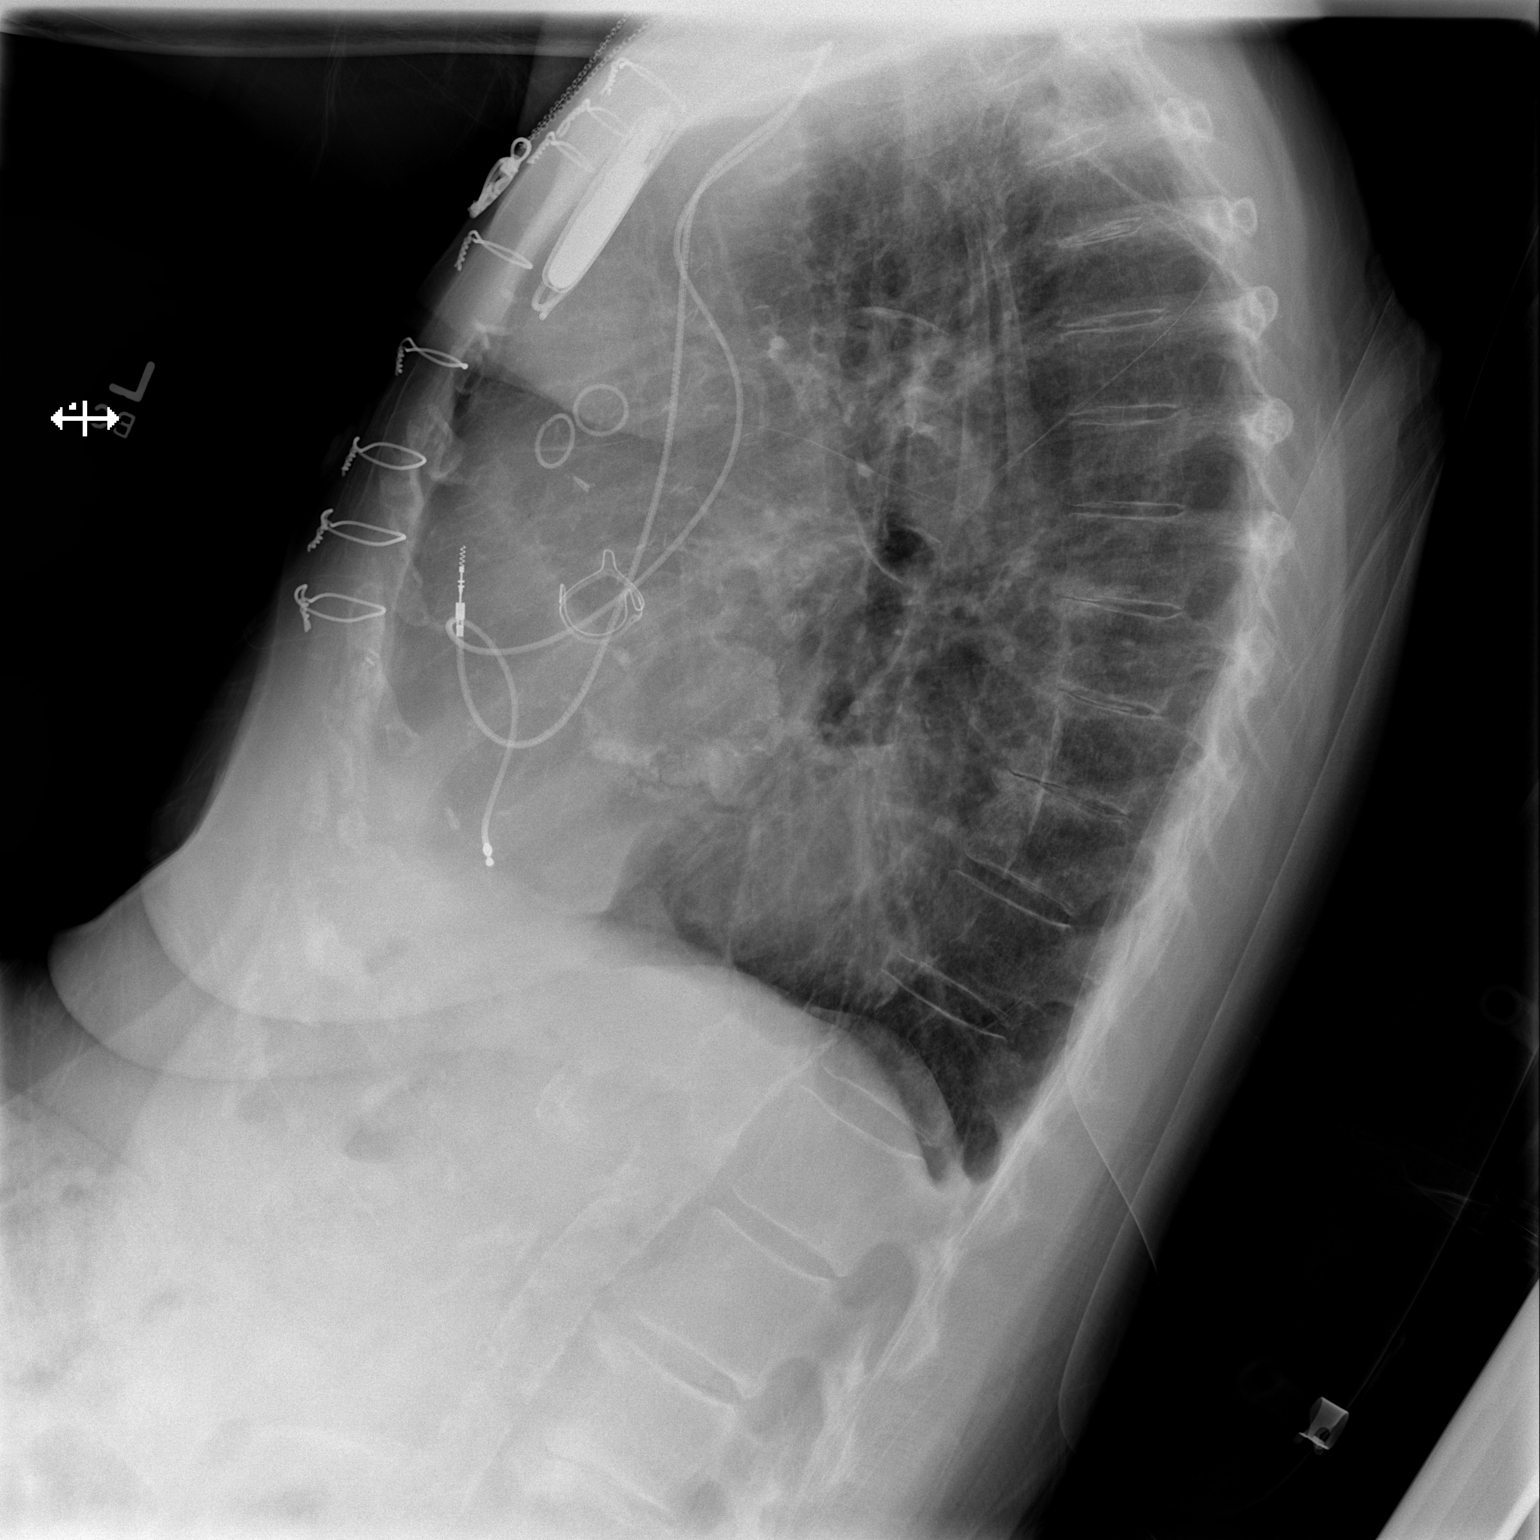

[x chest ap]
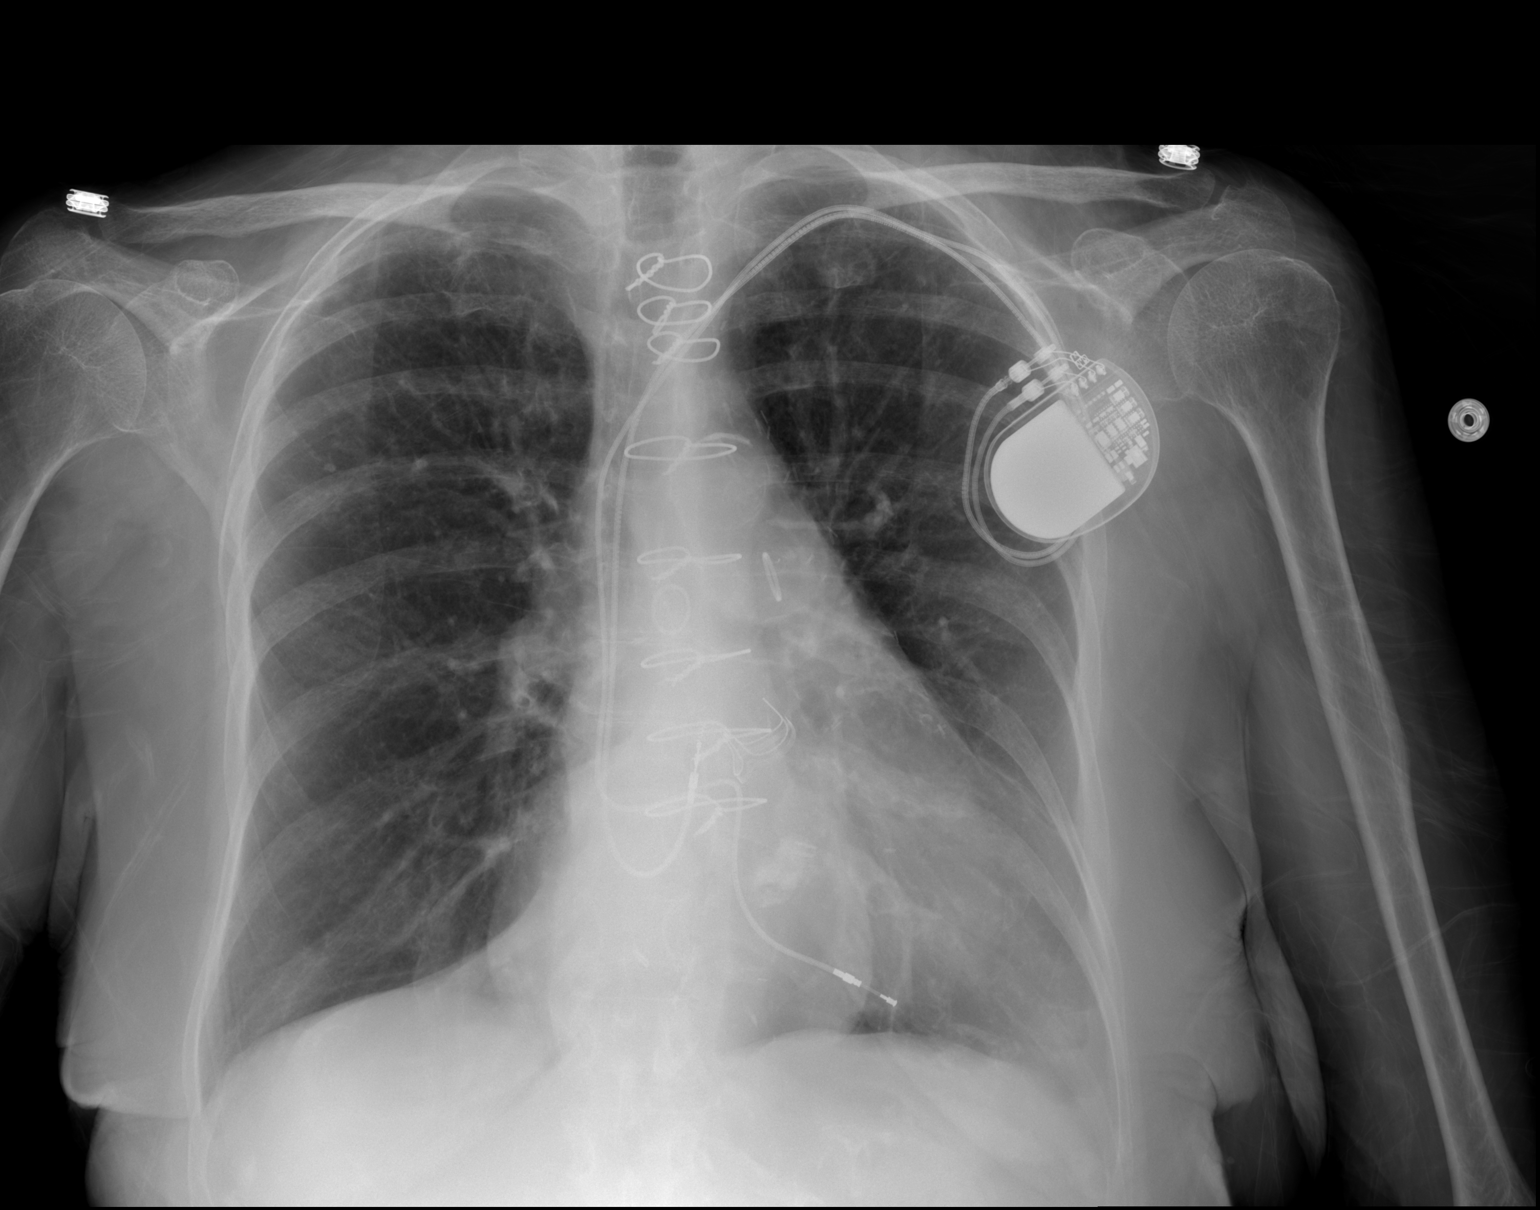

[2 of 2 positions shown; findings below may reference images not displayed]

FINDINGS: Postoperative changes in the mediastinum. Cardiac pacemaker.
Borderline heart size with normal pulmonary vascularity.
Emphysematous changes and scattered fibrosis in the lungs. No focal
airspace disease or consolidation. No blunting of costophrenic
angles esophageal hiatal hernia behind the heart. Calcification of
the aorta.
IMPRESSION: Emphysematous changes in the lungs. No focal airspace disease.
Esophageal hiatal hernia behind the heart

## 2015-08-06 IMAGING — CT CT HEAD W/O CM
2 series · 15 of 30 positions shown, 19 images · non-contrast
Comparison: None.

CLINICAL DATA: Trouble walking.  Fatigue.  Headache.

EXAM:
CT HEAD WITHOUT CONTRAST
TECHNIQUE: Contiguous axial images were obtained from the base of the skull
through the vertex without intravenous contrast.

[Series 201: head w/o, idose (1) · axial · non-contrast · 0.49mm/px · z∈[+65,+195]mm · 13 of 32 slices shown, 17 images]
[im 3/32  brain]
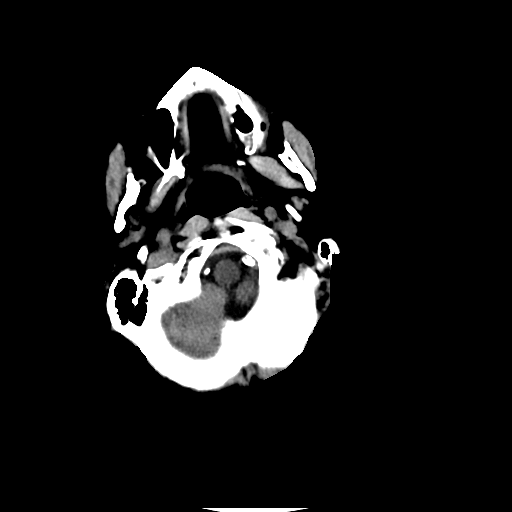
[im 3/32  bone]
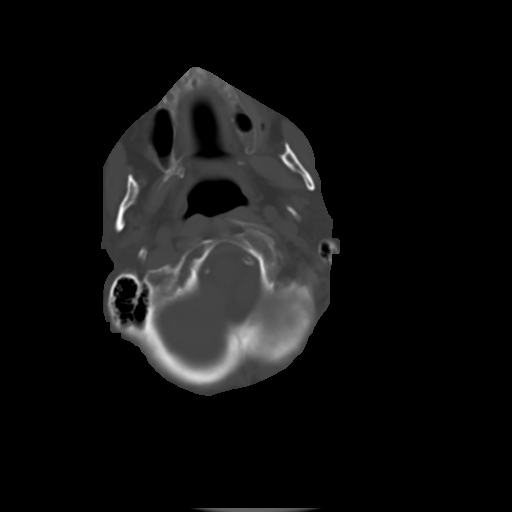
[im 5/32  brain]
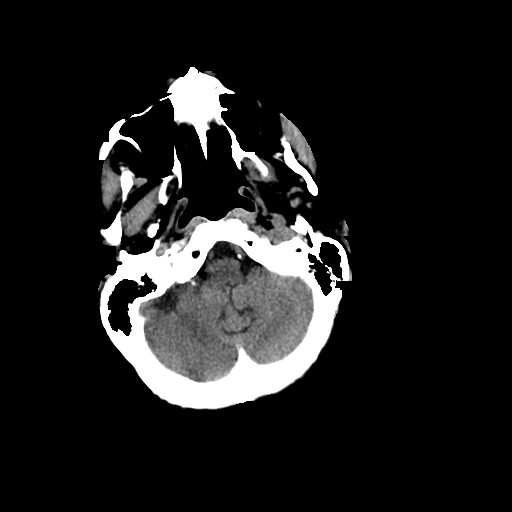
[im 7/32  brain]
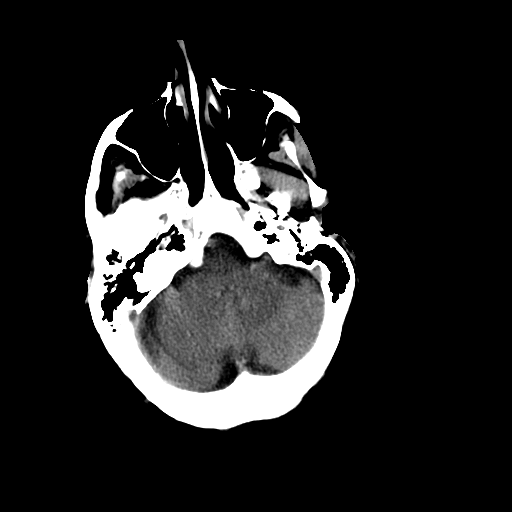
[im 9/32  brain]
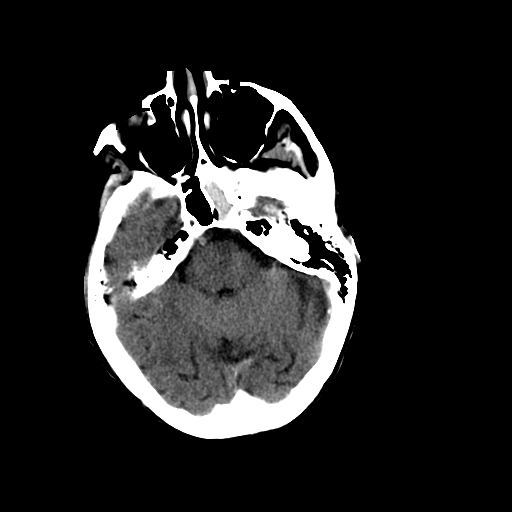
[im 12/32  brain]
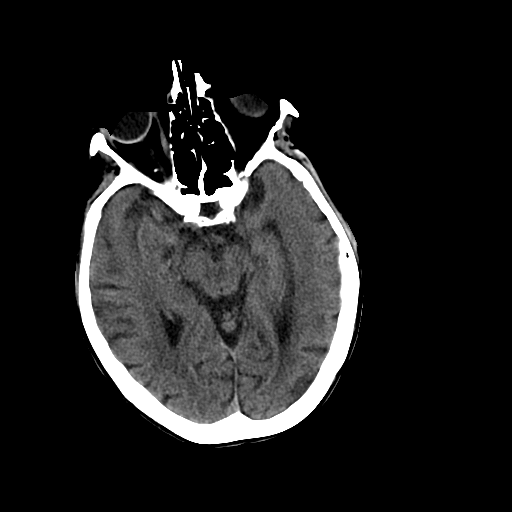
[im 12/32  bone]
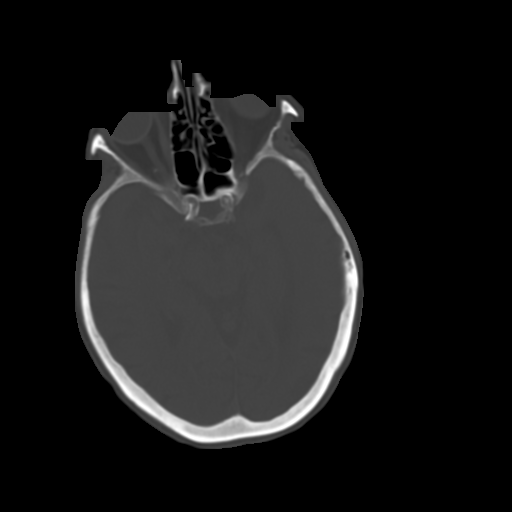
[im 14/32  brain]
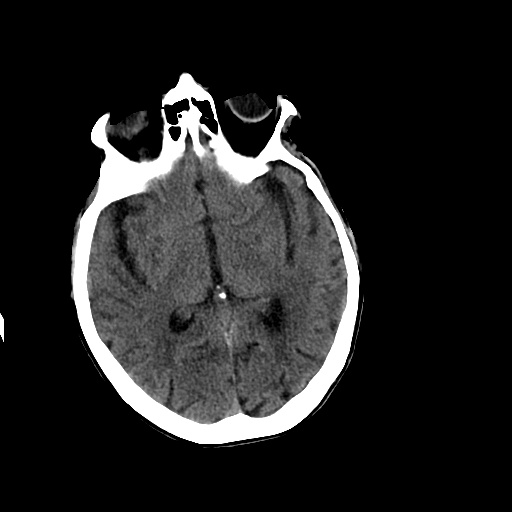
[im 16/32  brain]
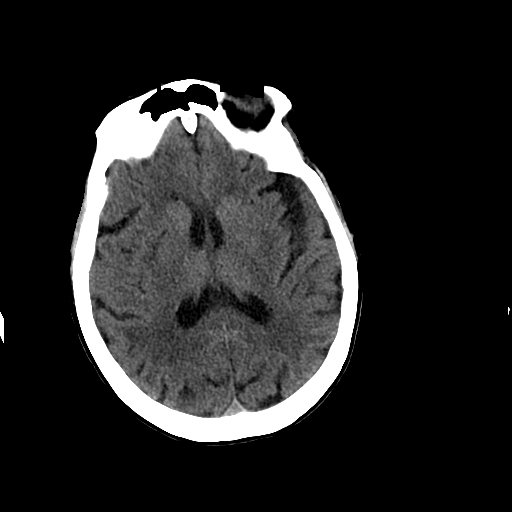
[im 18/32  brain]
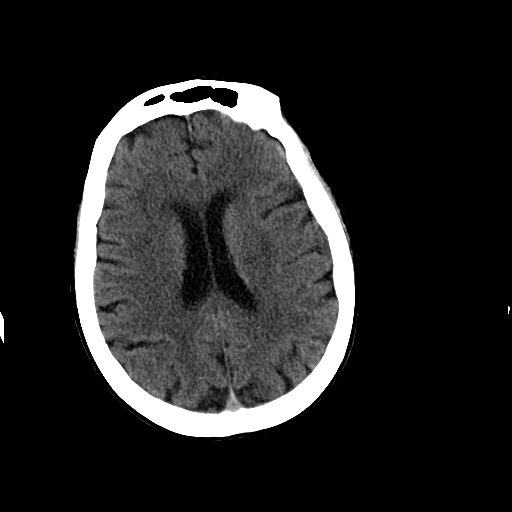
[im 20/32  brain]
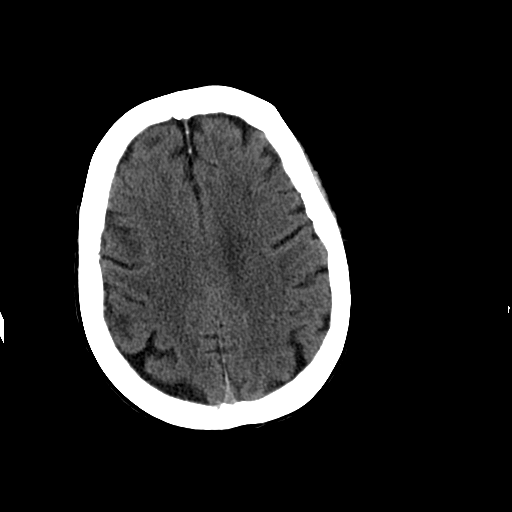
[im 20/32  bone]
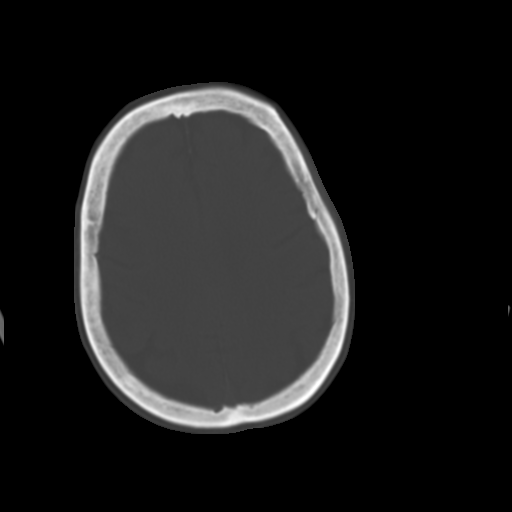
[im 23/32  brain]
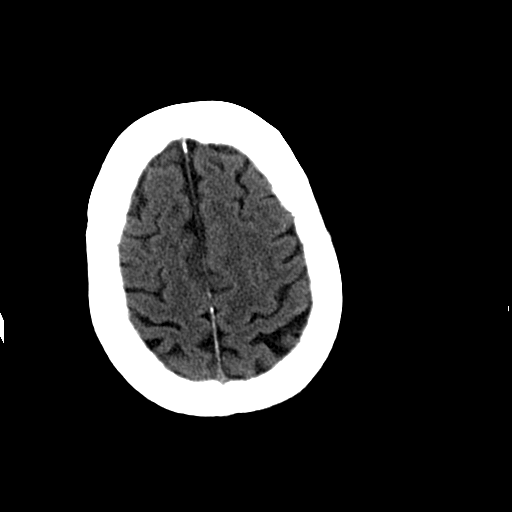
[im 25/32  brain]
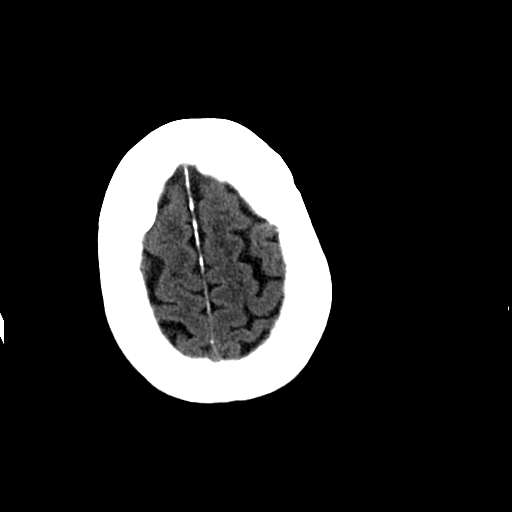
[im 27/32  brain]
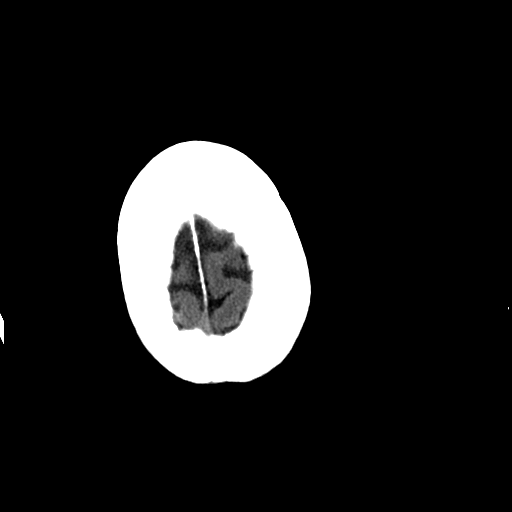
[im 29/32  brain]
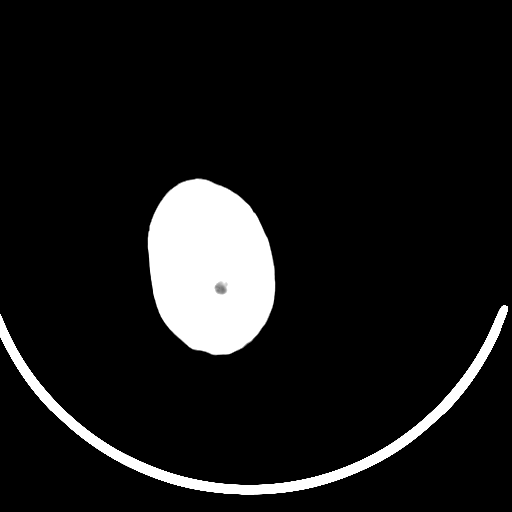
[im 29/32  bone]
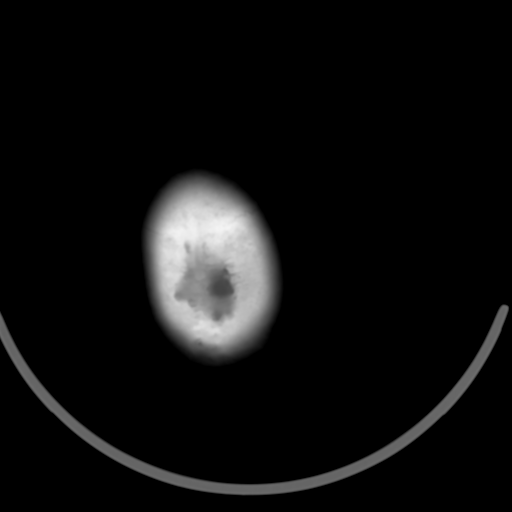

[Series 202: head w/o bone, idose (1) · axial · non-contrast · 0.49mm/px · z∈[+65,+85]mm · 2 of 32 slices shown]
[im 3/32  bone]
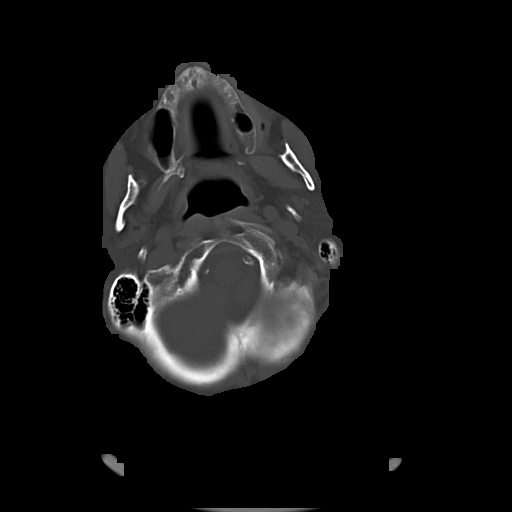
[im 7/32  bone]
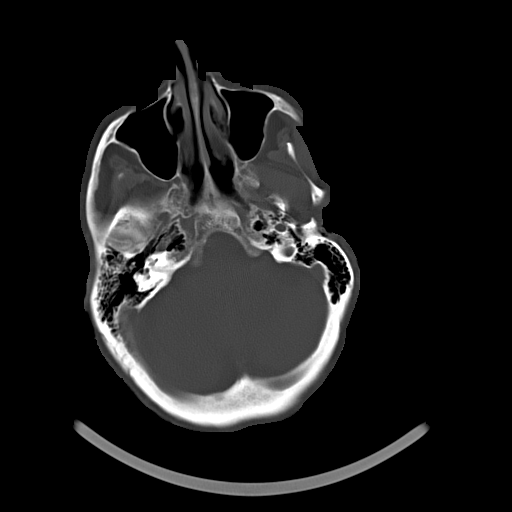

[15 of 30 positions shown; findings below may reference images not displayed]

FINDINGS: Diffuse prominence of the CSF containing spaces is compatible with
generalized cerebral atrophy. Scattered and confluent hypodensity
within the periventricular and deep white matter both cerebral
hemispheres is most consistent with moderate chronic microvascular
ischemic disease. Prominent vascular calcifications present within
the carotid siphons and distal vertebral arteries.

There is no acute intracranial hemorrhage or infarct. No mass lesion
or midline shift. Gray-white matter differentiation is well
maintained. Ventricles are normal in size without evidence of
hydrocephalus. CSF containing spaces are within normal limits. No
extra-axial fluid collection.

The calvarium is intact.

Orbital soft tissues are within normal limits.

There is partial opacification of the left sphenoid sinus, which
appears chronic in nature. Paranasal sinuses are otherwise clear. No
mastoid effusion. Paranasal sinuses and mastoid air cells are well
pneumatized and free of fluid.

Scalp soft tissues are unremarkable.
IMPRESSION: 1. No acute intracranial process.
2. Atrophy with moderate chronic microvascular ischemic disease.
3. Chronic left sphenoid sinus disease.

## 2015-08-11 ENCOUNTER — Ambulatory Visit: Payer: Medicare Other | Admitting: Physician Assistant

## 2015-08-17 ENCOUNTER — Ambulatory Visit (INDEPENDENT_AMBULATORY_CARE_PROVIDER_SITE_OTHER): Payer: Medicare Other | Admitting: Physician Assistant

## 2015-08-17 ENCOUNTER — Encounter: Payer: Self-pay | Admitting: Physician Assistant

## 2015-08-17 VITALS — BP 120/68 | HR 63 | Temp 98.1°F | Resp 16 | Ht 61.0 in | Wt 110.0 lb

## 2015-08-17 DIAGNOSIS — H6123 Impacted cerumen, bilateral: Secondary | ICD-10-CM | POA: Diagnosis not present

## 2015-08-17 DIAGNOSIS — E1142 Type 2 diabetes mellitus with diabetic polyneuropathy: Secondary | ICD-10-CM

## 2015-08-17 DIAGNOSIS — E039 Hypothyroidism, unspecified: Secondary | ICD-10-CM | POA: Diagnosis not present

## 2015-08-17 DIAGNOSIS — I10 Essential (primary) hypertension: Secondary | ICD-10-CM

## 2015-08-17 LAB — HEMOGLOBIN A1C: Hgb A1c MFr Bld: 7 % — ABNORMAL HIGH (ref 4.6–6.5)

## 2015-08-17 LAB — BASIC METABOLIC PANEL
BUN: 33 mg/dL — AB (ref 6–23)
CALCIUM: 9 mg/dL (ref 8.4–10.5)
CHLORIDE: 107 meq/L (ref 96–112)
CO2: 27 meq/L (ref 19–32)
CREATININE: 1.19 mg/dL (ref 0.40–1.20)
GFR: 46.47 mL/min — ABNORMAL LOW (ref >60.00)
Glucose, Bld: 116 mg/dL — ABNORMAL HIGH (ref 70–99)
Potassium: 4.9 mEq/L (ref 3.5–5.1)
Sodium: 139 mEq/L (ref 135–145)

## 2015-08-17 MED ORDER — GLIPIZIDE ER 10 MG PO TB24: 10.0000 mg | ORAL_TABLET | Freq: Every morning | ORAL | Status: DC

## 2015-08-17 MED ORDER — CARVEDILOL 25 MG PO TABS: ORAL_TABLET | ORAL | Status: DC

## 2015-08-17 MED ORDER — RAMIPRIL 1.25 MG PO CAPS: 1.2500 mg | ORAL_CAPSULE | Freq: Every day | ORAL | Status: DC

## 2015-08-17 NOTE — Patient Instructions (Signed)
Please go to the lab for blood work. I will call with your results. Continue medications as directed.  You will be contacted for assessment by the ENT.

## 2015-08-17 NOTE — Progress Notes (Signed)
Pre visit review using our clinic review tool, if applicable. No additional management support is needed unless otherwise documented below in the visit note/SLS  

## 2015-08-18 ENCOUNTER — Encounter: Payer: Self-pay | Admitting: *Deleted

## 2015-08-22 NOTE — Progress Notes (Signed)
Patient presents to clinic today for follow-up of hypertension and hypothyroidism.   Hypertension -- Patient endorses taking her Carvedilol and Ramipril as directed. Patient denies chest pain, palpitations, lightheadedness, dizziness, vision changes or frequent headaches.  BP Readings from Last 3 Encounters:  08/17/15 120/68  05/14/15 120/70  02/24/15 110/50   Hypothyroidism -- Patient is taking her levothyroxine 50 mcg daily as directed. Denies change in energy, mood or bowels.   Patient endorses decreased hearing bilaterally with sensation of ears being clogged. Denies fever, chills, ear pain, tinnitus, ear drainage or URI symptoms.  Past Medical History  Diagnosis Date  . Iron deficiency anemia, unspecified   . Chronic diastolic CHF (congestive heart failure) ([place])   . CAD  S/P CABG x 3 08/2011     LIMA to diagonal branch, SVG to OM1, SVG to PDA, EVH via bilateral thighs  . Type II or unspecified type diabetes mellitus without mention of complication, not stated as uncontrolled   . Contact dermatitis and other eczema, due to unspecified cause   . Esophageal reflux   . Headache(784.0)   . Pacemaker MDT dual chamber     DOI 08/2011  . Pure hypercholesterolemia   . hypertension   . Hypothyroidism   . Atrioventricular block, complete -intermittent   . Memory loss   . Idiopathic peripheral neuropathy   . LBBB (left bundle branch block)   . Carotid stenosis     a. Carotid US 2/63 with 33% LICA stenosis;  b. Carotid US 11/16:  Bilateral ICA 40-59% >> FU 1 year  . Severe aortic stenosis -S/P AVR      19 mm [facility] Ease pericardial tissue valve  . Extrinsic asthma, unspecified     hasn't used inhaler in past year  . CKD (chronic kidney disease) stage 4, GFR 15-29 ml/min (HCC)     Cr 1.6 in 6/11, sees Dr. Arty Baumgartner  . Impaired vision     pt. reports that she identifies her meds by looking at the pillls, she is not able to read labels on bottles    Current Outpatient  Prescriptions on File Prior to Visit  Medication Sig Dispense Refill  . albuterol (PROVENTIL HFA;VENTOLIN HFA) 108 (90 BASE) MCG/ACT inhaler Inhale 2 puffs into the lungs every 6 (six) hours as needed for wheezing or shortness of breath. 1 Inhaler 0  . ammonium lactate (AMLACTIN) 12 % lotion Apply 1 application topically as needed for dry skin. 225 g 0  . aspirin 81 MG EC tablet Take 1 tablet (81 mg total) by mouth daily. 30 tablet 3  . atorvastatin (LIPITOR) 40 MG tablet Take 1 tablet (40 mg total) by mouth daily. 90 tablet 1  . Blood Glucose Monitoring Suppl (ONE TOUCH ULTRA MINI) W/DEVICE KIT USE AS DIRECTED TO TEST BLOOD GLUCOSE ONCE DAILY DX: 250.00 1 each 0  . Cholecalciferol (VITAMIN D-3) 1000 UNITS CAPS Take 1 capsule by mouth daily.    . clonazePAM (KLONOPIN) 0.5 MG tablet Take 0.5 mg by mouth at bedtime as needed for anxiety.    . fluticasone (FLONASE) 50 MCG/ACT nasal spray Place 2 sprays into both nostrils daily. 16 g 0  . glucose blood test strip Use as instructed to test blood glucose once daily Dx: E11.9 100 each 12  . HYDROcodone-acetaminophen (NORCO) 5-325 MG per tablet Take 1-2 tablets by mouth every 6 (six) hours as needed. 15 tablet 0  . levothyroxine (SYNTHROID, LEVOTHROID) 50 MCG tablet TAKE ONE TABLET BY MOUTH ONCE DAILY  BEFORE BREAKFAST 90 tablet 1  . polyethylene glycol powder (GLYCOLAX/MIRALAX) powder MIX ONE CAPFUL IN LIQUID AND DRINK ONCE DAILY 765 g 3   No current facility-administered medications on file prior to visit.    Allergies  Allergen Reactions  . Keflex [Cephalexin]     rash  . Metformin     REACTION: intolerant  . Promethazine Hcl     REACTION: u/k  . Diflucan [Fluconazole] Rash    Renal Failure    Family History  Problem Relation Age of Onset  . Stroke Father     died in his 33's  . Stroke Mother     died in her 43's  . Diabetes Father   . Prostate cancer Son   . Diabetes Son     #2  . Hypertension Son     #3  . Thyroid disease Neg Hx    . Heart attack Neg Hx     Social History   Social History  . Marital Status: Widowed    Spouse Name: N/A  . Number of Children: 3  . Years of Education: N/A   Occupational History  . Retired    Social History Main Topics  . Smoking status: Never Smoker   . Smokeless tobacco: Never Used  . Alcohol Use: No  . Drug Use: No  . Sexual Activity: No   Other Topics Concern  . None   Social History Narrative   Widowed.  Lives with son in [place].             Review of Systems - See HPI.  All other ROS are negative.  BP 120/68 mmHg  Pulse 63  Temp(Src) 98.1 F (36.7 C) (Oral)  Resp 16  Ht '5\' 1"'  (1.549 m)  Wt 110 lb (49.896 kg)  BMI 20.80 kg/m2  SpO2 98%  Physical Exam  Constitutional: She is oriented to person, place, and time and well-developed, well-nourished, and in no distress.  HENT:  Head: Normocephalic and atraumatic.  Right Ear: External ear normal.  Left Ear: External ear normal.  Nose: Nose normal.  Mouth/Throat: Oropharynx is clear and moist. No oropharyngeal exudate.  Cerumen impaction noted bilaterally.  Eyes: Conjunctivae are normal.  Neck: Neck supple.  Cardiovascular: Normal rate, regular rhythm, normal heart sounds and intact distal pulses.   Pulmonary/Chest: Effort normal and breath sounds normal. No respiratory distress. She has no wheezes. She has no rales. She exhibits no tenderness.  Neurological: She is alert and oriented to person, place, and time.  Skin: Skin is warm and dry. No rash noted.  Psychiatric: Affect normal.  Vitals reviewed.   Recent Results (from the past 2160 hour(s))  TSH     Status: None   Collection Time: 06/30/15 10:22 AM  Result Value Ref Range   TSH 2.16 0.35 - 4.50 uIU/mL  Basic Metabolic Panel (BMET)     Status: Abnormal   Collection Time: 08/17/15 11:56 AM  Result Value Ref Range   Sodium 139 135 - 145 mEq/L   Potassium 4.9 3.5 - 5.1 mEq/L   Chloride 107 96 - 112 mEq/L   CO2 27 19 - 32 mEq/L    Glucose, Bld 116 (H) 70 - 99 mg/dL   BUN 33 (H) 6 - 23 mg/dL   Creatinine, Ser 1.19 0.40 - 1.20 mg/dL   Calcium 9.0 8.4 - 10.5 mg/dL   GFR 46.47 (L) >60.00 mL/min  Hemoglobin A1c     Status: Abnormal   Collection Time: 08/17/15 11:56 AM  Result Value Ref Range   Hgb A1c MFr Bld 7.0 (H) 4.6 - 6.5 %    Comment: Glycemic Control Guidelines for People with Diabetes:Non Diabetic:  <6%Goal of Therapy: <7%Additional Action Suggested:  >8%     Assessment/Plan: 1. Controlled type 2 diabetes mellitus with diabetic polyneuropathy, without long-term current use of insulin ([place]) Will obtain labs today. Continue current regimen. Foot and eye exam up-to-date.  - glipiZIDE (GLUCOTROL XL) 10 MG 24 hr tablet; Take 1 tablet (10 mg total) by mouth every morning.  Dispense: 90 tablet; Refill: 0 - carvedilol (COREG) 25 MG tablet; TAKE ONE-HALF TABLET BY MOUTH TWICE DAILY WITH A MEAL  Dispense: 90 tablet; Refill: 0 - ramipril (ALTACE) 1.25 MG capsule; Take 1 capsule (1.25 mg total) by mouth daily.  Dispense: 90 capsule; Refill: 0 - Basic Metabolic Panel (BMET) - Hemoglobin A1c  2. Essential hypertension Stable. Asymptomatic. Will continue current regimen.  3. Hypothyroidism, unspecified hypothyroidism type Stable. Continue current regimen.  4. Cerumen impaction, bilateral Partially emoved via irrigation by nursing staff. Suggested Debrox at home versus ENT assessment to remove residual wax as suction may be needed. Referral placed. Supportive measures reviewed.

## 2015-09-07 ENCOUNTER — Ambulatory Visit: Payer: Medicare Other | Admitting: Podiatry

## 2015-11-29 ENCOUNTER — Emergency Department (HOSPITAL_BASED_OUTPATIENT_CLINIC_OR_DEPARTMENT_OTHER): Payer: Medicare Other

## 2015-11-29 ENCOUNTER — Emergency Department (HOSPITAL_BASED_OUTPATIENT_CLINIC_OR_DEPARTMENT_OTHER)
Admission: EM | Admit: 2015-11-29 | Discharge: 2015-11-29 | Disposition: A | Discharge status: Home / Self Care | Payer: Medicare Other | Attending: Emergency Medicine | Admitting: Emergency Medicine

## 2015-11-29 ENCOUNTER — Encounter (HOSPITAL_BASED_OUTPATIENT_CLINIC_OR_DEPARTMENT_OTHER): Payer: Self-pay | Admitting: *Deleted

## 2015-11-29 DIAGNOSIS — Z7951 Long term (current) use of inhaled steroids: Secondary | ICD-10-CM | POA: Insufficient documentation

## 2015-11-29 DIAGNOSIS — R0602 Shortness of breath: Secondary | ICD-10-CM | POA: Diagnosis present

## 2015-11-29 DIAGNOSIS — Z7982 Long term (current) use of aspirin: Secondary | ICD-10-CM | POA: Diagnosis not present

## 2015-11-29 DIAGNOSIS — J45909 Unspecified asthma, uncomplicated: Secondary | ICD-10-CM | POA: Insufficient documentation

## 2015-11-29 DIAGNOSIS — I130 Hypertensive heart and chronic kidney disease with heart failure and stage 1 through stage 4 chronic kidney disease, or unspecified chronic kidney disease: Secondary | ICD-10-CM | POA: Insufficient documentation

## 2015-11-29 DIAGNOSIS — E1122 Type 2 diabetes mellitus with diabetic chronic kidney disease: Secondary | ICD-10-CM | POA: Insufficient documentation

## 2015-11-29 DIAGNOSIS — I2510 Atherosclerotic heart disease of native coronary artery without angina pectoris: Secondary | ICD-10-CM | POA: Diagnosis not present

## 2015-11-29 DIAGNOSIS — I5032 Chronic diastolic (congestive) heart failure: Secondary | ICD-10-CM | POA: Diagnosis not present

## 2015-11-29 DIAGNOSIS — Z7984 Long term (current) use of oral hypoglycemic drugs: Secondary | ICD-10-CM | POA: Diagnosis not present

## 2015-11-29 DIAGNOSIS — E039 Hypothyroidism, unspecified: Secondary | ICD-10-CM | POA: Diagnosis not present

## 2015-11-29 DIAGNOSIS — Z79899 Other long term (current) drug therapy: Secondary | ICD-10-CM | POA: Insufficient documentation

## 2015-11-29 DIAGNOSIS — N184 Chronic kidney disease, stage 4 (severe): Secondary | ICD-10-CM | POA: Insufficient documentation

## 2015-11-29 DIAGNOSIS — J441 Chronic obstructive pulmonary disease with (acute) exacerbation: Secondary | ICD-10-CM | POA: Insufficient documentation

## 2015-11-29 LAB — BASIC METABOLIC PANEL
ANION GAP: 5 (ref 5–15)
BUN: 33 mg/dL — ABNORMAL HIGH (ref 6–20)
CHLORIDE: 106 mmol/L (ref 101–111)
CO2: 27 mmol/L (ref 22–32)
Calcium: 9 mg/dL (ref 8.9–10.3)
Creatinine, Ser: 1.25 mg/dL — ABNORMAL HIGH (ref 0.44–1.00)
GFR calc non Af Amer: 40 mL/min — ABNORMAL LOW (ref >60)
GFR, EST AFRICAN AMERICAN: 46 mL/min — AB (ref >60)
Glucose, Bld: 81 mg/dL (ref 65–99)
Potassium: 4.7 mmol/L (ref 3.5–5.1)
Sodium: 138 mmol/L (ref 135–145)

## 2015-11-29 LAB — CBC WITH DIFFERENTIAL/PLATELET
BASOS PCT: 0 %
Basophils Absolute: 0 10*3/uL (ref 0.0–0.1)
Eosinophils Absolute: 0.4 10*3/uL (ref 0.0–0.7)
Eosinophils Relative: 6 %
HEMATOCRIT: 36.7 % (ref 36.0–46.0)
HEMOGLOBIN: 12.2 g/dL (ref 12.0–15.0)
LYMPHS ABS: 2.3 10*3/uL (ref 0.7–4.0)
Lymphocytes Relative: 33 %
MCH: 32.1 pg (ref 26.0–34.0)
MCHC: 33.2 g/dL (ref 30.0–36.0)
MCV: 96.6 fL (ref 78.0–100.0)
MONOS PCT: 13 %
Monocytes Absolute: 1 10*3/uL (ref 0.1–1.0)
NEUTROS ABS: 3.4 10*3/uL (ref 1.7–7.7)
NEUTROS PCT: 48 %
Platelets: 238 10*3/uL (ref 150–400)
RBC: 3.8 MIL/uL — AB (ref 3.87–5.11)
RDW: 12.6 % (ref 11.5–15.5)
WBC: 7.1 10*3/uL (ref 4.0–10.5)

## 2015-11-29 LAB — BRAIN NATRIURETIC PEPTIDE: B NATRIURETIC PEPTIDE 5: 227.6 pg/mL — AB (ref 0.0–100.0)

## 2015-11-29 LAB — TROPONIN I: Troponin I: <0.03 ng/mL (ref <0.03)

## 2015-11-29 MED ORDER — IPRATROPIUM-ALBUTEROL 0.5-2.5 (3) MG/3ML IN SOLN
3.0000 mL | Freq: Once | RESPIRATORY_TRACT | Status: AC
Start: 2015-11-29 — End: 2015-11-29
  Administered 2015-11-29: 3 mL via RESPIRATORY_TRACT

## 2015-11-29 MED ORDER — ALBUTEROL SULFATE (2.5 MG/3ML) 0.083% IN NEBU
INHALATION_SOLUTION | RESPIRATORY_TRACT | Status: AC
  Administered 2015-11-29: 2.5 mg via RESPIRATORY_TRACT
  Filled 2015-11-29: qty 3

## 2015-11-29 MED ORDER — ALBUTEROL SULFATE HFA 108 (90 BASE) MCG/ACT IN AERS
2.0000 | INHALATION_SPRAY | RESPIRATORY_TRACT | Status: DC | PRN
  Administered 2015-11-29: 2 via RESPIRATORY_TRACT
  Filled 2015-11-29: qty 6.7

## 2015-11-29 MED ORDER — IPRATROPIUM-ALBUTEROL 0.5-2.5 (3) MG/3ML IN SOLN
RESPIRATORY_TRACT | Status: AC
  Administered 2015-11-29: 3 mL via RESPIRATORY_TRACT
  Filled 2015-11-29: qty 3

## 2015-11-29 MED ORDER — ALBUTEROL SULFATE (2.5 MG/3ML) 0.083% IN NEBU
2.5000 mg | INHALATION_SOLUTION | Freq: Once | RESPIRATORY_TRACT | Status: AC
  Administered 2015-11-29: 2.5 mg via RESPIRATORY_TRACT

## 2015-11-29 MED ORDER — AZITHROMYCIN 250 MG PO TABS: 250.0000 mg | ORAL_TABLET | Freq: Every day | ORAL | 0 refills | Status: DC

## 2015-11-29 NOTE — ED Notes (Signed)
MD at bedside. 

## 2015-11-29 NOTE — Discharge Instructions (Signed)
Zithromax as prescribed.  Albuterol inhaler: 2 puffs every 4 hours as needed for wheezing.  Return to the emergency department if symptoms and really worsen or change.

## 2015-11-29 NOTE — ED Provider Notes (Signed)
[facility] DEPT [facility] Provider Note   CSN: [id number] Arrival date & time: 11/29/15  1642  By signing my name below, I, Emmanuella Mensah, attest that this documentation has been prepared under the direction and in the presence of Veryl Speak, MD. Electronically Signed: Judithann Sauger, ED Scribe. 11/29/15. 5:12 PM.   History   Chief Complaint Chief Complaint  Patient presents with  . Shortness of Breath    HPI Comments: Barbara Garner is a 81 y.o. female with a hx of asthma, Afib, hypertension, and CAD who presents to the Emergency Department complaining of gradually worsening persistent non-productive cough onset 2 days ago. Pt's family report associated nasal congestion, wheezing, and difficulty breathing. No alleviating factors noted. Pt has not tried any medications PTA. She reports that she is on a daily aspirin. She denies any pain, chest pain, nausea, vomiting, or any generalized rash.   The history is provided by the patient. No language interpreter was used.    Past Medical History:  Diagnosis Date  . Atrioventricular block, complete -intermittent   . CAD  S/P CABG x 3 08/2011    LIMA to diagonal branch, SVG to OM1, SVG to PDA, EVH via bilateral thighs  . Carotid stenosis    a. Carotid US 7/82 with 95% LICA stenosis;  b. Carotid US 11/16:  Bilateral ICA 40-59% >> FU 1 year  . Chronic diastolic CHF (congestive heart failure) ([place])   . CKD (chronic kidney disease) stage 4, GFR 15-29 ml/min (HCC)    Cr 1.6 in 6/11, sees Dr. Arty Baumgartner  . Contact dermatitis and other eczema, due to unspecified cause   . Esophageal reflux   . Extrinsic asthma, unspecified    hasn't used inhaler in past year  . Headache(784.0)   . hypertension   . Hypothyroidism   . Idiopathic peripheral neuropathy   . Impaired vision    pt. reports that she identifies her meds by looking at the pillls, she is not able to read labels on bottles  . Iron deficiency anemia, unspecified   . LBBB (left  bundle branch block)   . Memory loss   . Pacemaker MDT dual chamber    DOI 08/2011  . Pure hypercholesterolemia   . Severe aortic stenosis -S/P AVR     19 mm [facility] Ease pericardial tissue valve  . Type II or unspecified type diabetes mellitus without mention of complication, not stated as uncontrolled     Patient Active Problem List   Diagnosis Date Noted  . Thyroid activity decreased 05/14/2015  . Postmenopausal estrogen deficiency 03/10/2015  . Pain in lower limb 03/08/2015  . Left knee pain 08/25/2014  . Hyperkalemia 05/25/2014  . CN (constipation) 05/08/2014  . Diabetes mellitus type 2, controlled ([place]) 03/11/2014  . Dementia 12/17/2013  . Memory loss, short term 10/29/2013  . Low back pain 09/24/2013  . Sinus bradycardia 09/24/2012  . Vitamin D deficiency disease 08/23/2012  . Lichen sclerosus et atrophicus of the vulva 04/05/2012  . Chronic diastolic CHF (congestive heart failure) ([place]) 11/15/2011  . Pacemaker-[employer]tr[employer] 09/13/2011  . Atrial fibrillation ([place]) 09/12/2011  . Complete heart block-intermittent 09/09/2011  . S/P aortic valve replacement 09/06/2011  . S/P CABG x 3 09/06/2011  . IVCD (intraventricular conduction defect)   . CKD (chronic kidney disease) stage 4, GFR 15-29 ml/min (HCC)   . Carotid stenosis 05/30/2011  . Squamous cell carcinoma of skin 07/21/2010  . MEMORY LOSS 08/19/2009  . HYPERCHOLESTEROLEMIA, PURE 09/20/2006  . ANEMIA-IRON DEFICIENCY 06/27/2006  .  PERIPHERAL NEUROPATHY 06/27/2006  . Essential hypertension 06/27/2006  . MYOCARDIAL INFARCTION, HX OF 06/27/2006  . CORONARY ARTERY DISEASE 06/27/2006  . ASTHMA 06/27/2006  . PNEUMONIA, HX OF 06/27/2006  . ECZEMA 06/27/2006    Past Surgical History:  Procedure Laterality Date  . ABDOMINAL HYSTERECTOMY  1977   partial; fibroids  . AORTIC VALVE REPLACEMENT  09/06/2011   Procedure: AORTIC VALVE REPLACEMENT (AVR);  Surgeon: Rexene Alberts, MD;  Location: [facility];  Service: Open Heart  Surgery;  Laterality: N/A;  . bladder tack  1977  . CARDIAC CATHETERIZATION    . CARDIOVASCULAR STRESS TEST  10/2002   normal (per patient)  . carotid dopplers  2006  . CATARACT EXTRACTION, BILATERAL  2003  . COLONOSCOPY  04/1999   1 polyp  . COLONOSCOPY  06/2004   Neg. Int hem  . COLONOSCOPY  08/2013  . CORONARY ARTERY BYPASS GRAFT  09/06/2011   Procedure: CORONARY ARTERY BYPASS GRAFTING (CABG);  Surgeon: Rexene Alberts, MD;  Location: [facility];  Service: Open Heart Surgery;  Laterality: N/A;  . DEXA  10/2003 ,09/2005   osteoporosis,  Osteopenia  . ESOPHAGEAL DILATION  1997  . ESOPHAGOGASTRODUODENOSCOPY  04/1999   HH; "watermelon stomach" (severe gastritis)  . ESOPHAGOGASTRODUODENOSCOPY  04/2002   Gastritis  . LEFT AND RIGHT HEART CATHETERIZATION WITH CORONARY ANGIOGRAM N/A 08/11/2011   Procedure: LEFT AND RIGHT HEART CATHETERIZATION WITH CORONARY ANGIOGRAM;  Surgeon: Larey Dresser, MD;  Location: [facility] CATH LAB;  Service: Cardiovascular;  Laterality: N/A;  . Opthy  11/1998;12/01;11/02  . PERMANENT PACEMAKER INSERTION N/A 08/03/2011   Procedure: PERMANENT PACEMAKER INSERTION;  Surgeon: Deboraha Sprang, MD;  Location: [facility] CATH LAB;  Service: Cardiovascular;  Laterality: N/A;  . PTCA  [id number]   5 blockages  . REFRACTIVE SURGERY  2006    OB History    No data available       Home Medications    Prior to Admission medications   Medication Sig Start Date End Date Taking? Authorizing Provider  albuterol (PROVENTIL HFA;VENTOLIN HFA) 108 (90 BASE) MCG/ACT inhaler Inhale 2 puffs into the lungs every 6 (six) hours as needed for wheezing or shortness of breath. 02/02/14  Yes [name], PA-C  aspirin 81 MG EC tablet Take 1 tablet (81 mg total) by mouth daily. 11/14/13  Yes [name], PA-C  atorvastatin (LIPITOR) 40 MG tablet Take 1 tablet (40 mg total) by mouth daily. 09/28/14  Yes [name], PA-C  Blood Glucose Monitoring Suppl (ONE TOUCH ULTRA MINI) W/DEVICE KIT USE AS DIRECTED  TO TEST BLOOD GLUCOSE ONCE DAILY DX: 250.00 11/14/13  Yes [name], PA-C  carvedilol (COREG) 25 MG tablet TAKE ONE-HALF TABLET BY MOUTH TWICE DAILY WITH A MEAL 08/17/15  Yes [name], PA-C  Cholecalciferol (VITAMIN D-3) 1000 UNITS CAPS Take 1 capsule by mouth daily.   Yes Historical Provider, MD  glipiZIDE (GLUCOTROL XL) 10 MG 24 hr tablet Take 1 tablet (10 mg total) by mouth every morning. 08/17/15  Yes [name], PA-C  glucose blood test strip Use as instructed to test blood glucose once daily Dx: E11.9 03/17/14  Yes [name], PA-C  levothyroxine (SYNTHROID, LEVOTHROID) 50 MCG tablet TAKE ONE TABLET BY MOUTH ONCE DAILY BEFORE BREAKFAST 07/01/15  Yes [name], PA-C  polyethylene glycol powder (GLYCOLAX/MIRALAX) powder MIX ONE CAPFUL IN LIQUID AND DRINK ONCE DAILY 12/02/14  Yes [name], PA-C  ramipril (ALTACE) 1.25 MG capsule Take 1  capsule (1.25 mg total) by mouth daily. 08/17/15  Yes [name], PA-C    Family History Family History  Problem Relation Age of Onset  . Stroke Mother     died in her 19's  . Stroke Father     died in his 93's  . Diabetes Father   . Prostate cancer Son   . Diabetes Son     #2  . Hypertension Son     #3  . Thyroid disease Neg Hx   . Heart attack Neg Hx     Social History Social History  Substance Use Topics  . Smoking status: Never Smoker  . Smokeless tobacco: Never Used  . Alcohol use No     Allergies   Keflex [cephalexin]; Metformin; Promethazine hcl; and Diflucan [fluconazole]   Review of Systems Review of Systems  A complete 10 system review of systems was obtained and all systems are negative except as noted in the HPI and PMH.    Physical Exam Updated Vital Signs BP 168/76 (BP Location: Right Arm)   Pulse 87   Temp 97.6 F (36.4 C)   Resp 22   Ht '5\' 1"'  (1.549 m)   Wt 110 lb (49.9 kg)   SpO2 98%   BMI 20.78 kg/m   Physical Exam  Constitutional: She is oriented to person, place,  and time. She appears well-developed and well-nourished. No distress.  HENT:  Head: Normocephalic and atraumatic.  Eyes: EOM are normal.  Neck: Normal range of motion.  Cardiovascular: Normal rate, regular rhythm and normal heart sounds.   Pulmonary/Chest: Effort normal and breath sounds normal.  Abdominal: Soft. She exhibits no distension. There is no tenderness.  Musculoskeletal: Normal range of motion.  Neurological: She is alert and oriented to person, place, and time.  Skin: Skin is warm and dry.  Psychiatric: She has a normal mood and affect. Judgment normal.  Nursing note and vitals reviewed.    ED Treatments / Results  DIAGNOSTIC STUDIES: Oxygen Saturation is 98% on RA, normal by my interpretation.    COORDINATION OF CARE: 5:10 PM- Pt advised of plan for treatment and pt agrees. Pt will receive duo neb and albuterol. She will also receive chest x-ray and lab work for further evaluation.    Labs (all labs ordered are listed, but only abnormal results are displayed) Labs Reviewed - No data to display  EKG  EKG Interpretation  Date/Time:  Monday November 29 2015 16:57:11 EDT Ventricular Rate:  62 PR Interval:    QRS Duration: 138 QT Interval:  477 QTC Calculation: 485 R Axis:   -48 Text Interpretation:  Atrial-paced rhythm Left bundle branch block Baseline wander in lead(s) I II aVR aVL aVF V1 V2 V3 V4 V5 V6 Confirmed by Klyn Kroening  MD, Decarlo Rivet ([id number]) on 11/29/2015 5:30:27 PM       Radiology No results found.  Procedures Procedures (including critical care time)  Medications Ordered in ED Medications  ipratropium-albuterol (DUONEB) 0.5-2.5 (3) MG/3ML nebulizer solution 3 mL (3 mLs Nebulization Given 11/29/15 1655)  albuterol (PROVENTIL) (2.5 MG/3ML) 0.083% nebulizer solution 2.5 mg (2.5 mg Nebulization Given 11/29/15 1655)     Initial Impression / Assessment and Plan / ED Course  Veryl Speak, MD has reviewed the triage vital signs and the nursing  notes.  Pertinent labs & imaging results that were available during my care of the patient were reviewed by me and considered in my medical decision making (see chart for details).  Clinical Course  Patient presents with complaints of chest congestion, difficulty breathing, and productive cough for the past 2 days. Her workup reveals no evidence for pneumonia, pulmonary edema, and I have a low suspicion for pulmonary embolism. Her oxygen saturations are in the upper 90s and she appears comfortable. She was initially wheezing slightly upon presentation, however this improved with a DuoNeb. The patient is anxious to go home. She will be discharged with Zithromax, and albuterol inhaler, and when necessary follow-up/return.  Final Clinical Impressions(s) / ED Diagnoses   Final diagnoses:  None    New Prescriptions New Prescriptions   No medications on file   I personally performed the services described in this documentation, which was scribed in my presence. The recorded information has been reviewed and is accurate.        Veryl Speak, MD 11/29/15 2042

## 2015-11-29 NOTE — ED Triage Notes (Signed)
C/o shortness of breath x 2 days. No pain.

## 2015-11-29 NOTE — ED Notes (Signed)
Pt reports that she is just tired of sitting here. EDP to be notified.

## 2015-12-14 ENCOUNTER — Other Ambulatory Visit: Payer: Self-pay | Admitting: Physician Assistant

## 2015-12-14 DIAGNOSIS — E1142 Type 2 diabetes mellitus with diabetic polyneuropathy: Secondary | ICD-10-CM

## 2015-12-14 NOTE — Telephone Encounter (Signed)
Rx request to pharmacy/SLS  

## 2016-01-31 ENCOUNTER — Telehealth: Payer: Self-pay | Admitting: Physician Assistant

## 2016-01-31 NOTE — Telephone Encounter (Signed)
ok 

## 2016-01-31 NOTE — Telephone Encounter (Signed)
Patient transferring from [facility] to Dr. Lorelei Pont, please advise

## 2016-01-31 NOTE — Telephone Encounter (Signed)
Ok with me 

## 2016-01-31 NOTE — Telephone Encounter (Signed)
Patient scheduled for 03/01/2016 with Dr. Lorelei Pont 30 minute slot.

## 2016-02-04 ENCOUNTER — Ambulatory Visit (HOSPITAL_COMMUNITY)
Admission: RE | Admit: 2016-02-04 | Discharge: 2016-02-04 | Disposition: A | Discharge status: Home / Self Care | Payer: Medicare Other | Source: Ambulatory Visit | Attending: Cardiovascular Disease | Admitting: Cardiovascular Disease

## 2016-02-04 DIAGNOSIS — I6523 Occlusion and stenosis of bilateral carotid arteries: Secondary | ICD-10-CM | POA: Insufficient documentation

## 2016-02-06 ENCOUNTER — Encounter: Payer: Self-pay | Admitting: Physician Assistant

## 2016-02-07 ENCOUNTER — Telehealth: Payer: Self-pay | Admitting: *Deleted

## 2016-02-07 NOTE — Telephone Encounter (Signed)
DPR ok to s/w son Barbara Garner. Barbara Garner has been advised of pt's carotid results and findings. He is aware we will repeat in 1 year and to have pt continue on her current medication regimen. Son verbalized understanding to plan of care.

## 2016-02-16 ENCOUNTER — Encounter: Payer: Self-pay | Admitting: Medical

## 2016-02-16 ENCOUNTER — Telehealth: Payer: Self-pay | Admitting: *Deleted

## 2016-02-16 ENCOUNTER — Ambulatory Visit (INDEPENDENT_AMBULATORY_CARE_PROVIDER_SITE_OTHER): Payer: Medicare Other | Admitting: Medical

## 2016-02-16 ENCOUNTER — Ambulatory Visit (HOSPITAL_BASED_OUTPATIENT_CLINIC_OR_DEPARTMENT_OTHER)
Admission: RE | Admit: 2016-02-16 | Discharge: 2016-02-16 | Disposition: A | Discharge status: Home / Self Care | Payer: Medicare Other | Source: Ambulatory Visit | Attending: Medical | Admitting: Medical

## 2016-02-16 ENCOUNTER — Telehealth: Payer: Self-pay | Admitting: Medical

## 2016-02-16 VITALS — BP 120/70 | HR 68 | Temp 98.4°F | Ht 60.0 in | Wt 109.0 lb

## 2016-02-16 DIAGNOSIS — R5383 Other fatigue: Secondary | ICD-10-CM | POA: Diagnosis not present

## 2016-02-16 DIAGNOSIS — R062 Wheezing: Secondary | ICD-10-CM | POA: Diagnosis not present

## 2016-02-16 DIAGNOSIS — R05 Cough: Secondary | ICD-10-CM | POA: Insufficient documentation

## 2016-02-16 DIAGNOSIS — Z950 Presence of cardiac pacemaker: Secondary | ICD-10-CM | POA: Insufficient documentation

## 2016-02-16 DIAGNOSIS — R059 Cough, unspecified: Secondary | ICD-10-CM

## 2016-02-16 DIAGNOSIS — I700 Atherosclerosis of aorta: Secondary | ICD-10-CM | POA: Diagnosis not present

## 2016-02-16 DIAGNOSIS — J209 Acute bronchitis, unspecified: Secondary | ICD-10-CM

## 2016-02-16 DIAGNOSIS — Z951 Presence of aortocoronary bypass graft: Secondary | ICD-10-CM | POA: Diagnosis not present

## 2016-02-16 DIAGNOSIS — Z952 Presence of prosthetic heart valve: Secondary | ICD-10-CM | POA: Insufficient documentation

## 2016-02-16 DIAGNOSIS — K449 Diaphragmatic hernia without obstruction or gangrene: Secondary | ICD-10-CM | POA: Diagnosis not present

## 2016-02-16 LAB — CBC WITH DIFFERENTIAL/PLATELET
BASOS PCT: 0.2 % (ref 0.0–3.0)
Basophils Absolute: 0 10*3/uL (ref 0.0–0.1)
EOS PCT: 1.3 % (ref 0.0–5.0)
Eosinophils Absolute: 0.2 10*3/uL (ref 0.0–0.7)
HEMATOCRIT: 32.2 % — AB (ref 36.0–46.0)
HEMOGLOBIN: 10.8 g/dL — AB (ref 12.0–15.0)
LYMPHS PCT: 11.2 % — AB (ref 12.0–46.0)
Lymphs Abs: 1.5 10*3/uL (ref 0.7–4.0)
MCHC: 33.6 g/dL (ref 30.0–36.0)
MCV: 93.6 fl (ref 78.0–100.0)
Monocytes Absolute: 1.1 10*3/uL — ABNORMAL HIGH (ref 0.1–1.0)
Monocytes Relative: 8 % (ref 3.0–12.0)
Neutro Abs: 10.5 10*3/uL — ABNORMAL HIGH (ref 1.4–7.7)
Neutrophils Relative %: 79.3 % — ABNORMAL HIGH (ref 43.0–77.0)
Platelets: 295 10*3/uL (ref 150.0–400.0)
RBC: 3.44 Mil/uL — ABNORMAL LOW (ref 3.87–5.11)
RDW: 12.6 % (ref 11.5–15.5)
WBC: 13.2 10*3/uL — AB (ref 4.0–10.5)

## 2016-02-16 LAB — COMPREHENSIVE METABOLIC PANEL
ALBUMIN: 3.7 g/dL (ref 3.5–5.2)
ALT: 11 U/L (ref 0–35)
AST: 17 U/L (ref 0–37)
Alkaline Phosphatase: 107 U/L (ref 39–117)
BUN: 39 mg/dL — ABNORMAL HIGH (ref 6–23)
CALCIUM: 9.5 mg/dL (ref 8.4–10.5)
CHLORIDE: 103 meq/L (ref 96–112)
CO2: 27 mEq/L (ref 19–32)
CREATININE: 1.38 mg/dL — AB (ref 0.40–1.20)
GFR: 39.12 mL/min — ABNORMAL LOW (ref >60.00)
Glucose, Bld: 192 mg/dL — ABNORMAL HIGH (ref 70–99)
POTASSIUM: 6.2 meq/L — AB (ref 3.5–5.1)
Sodium: 136 mEq/L (ref 135–145)
Total Bilirubin: 0.4 mg/dL (ref 0.2–1.2)
Total Protein: 7.7 g/dL (ref 6.0–8.3)

## 2016-02-16 MED ORDER — BENZONATATE 100 MG PO CAPS: 100.0000 mg | ORAL_CAPSULE | Freq: Three times a day (TID) | ORAL | 0 refills | Status: DC | PRN

## 2016-02-16 MED ORDER — PREDNISONE 10 MG PO TABS: ORAL_TABLET | ORAL | 0 refills | Status: DC

## 2016-02-16 MED ORDER — DOXYCYCLINE HYCLATE 100 MG PO TABS: 100.0000 mg | ORAL_TABLET | Freq: Two times a day (BID) | ORAL | 0 refills | Status: DC

## 2016-02-16 NOTE — Progress Notes (Signed)
Pre visit review using our clinic review tool, if applicable. No additional management support is needed unless otherwise documented below in the visit note. 

## 2016-02-16 NOTE — Telephone Encounter (Signed)
[facility] lab reporting critical potassium @ 6.2

## 2016-02-16 NOTE — Progress Notes (Addendum)
Subjective:    Patient ID: Barbara Garner, female    DOB: [birth year], 81 y.o.   MRN: [id number]  HPI  Pt coughing for at least week. Baseline fatigue. Coughing up mucous. No fever, no chills or sweats. Pt was wheezing just little this am. Pt got breathing treatment this morning. Pt son states he only gives her neb treatment one time a day. No recent increased frequency.  Pt has hx of copd. Hx of a lot of second hand smoke throughout her life when younger.     Review of Systems  Constitutional: Negative for chills, fatigue and fever.  HENT: Negative for congestion, ear pain, nosebleeds, sinus pain and sinus pressure.   Respiratory: Positive for cough and wheezing. Negative for shortness of breath.   Cardiovascular: Negative for chest pain and palpitations.  Gastrointestinal: Negative for abdominal pain.  Musculoskeletal: Negative for back pain.  Skin: Negative for rash.  Neurological: Negative for dizziness and headaches.  Hematological: Negative for adenopathy.  Psychiatric/Behavioral: Negative for confusion.   Past Medical History:  Diagnosis Date  . Atrioventricular block, complete -intermittent   . CAD  S/P CABG x 3 08/2011    LIMA to diagonal branch, SVG to OM1, SVG to PDA, EVH via bilateral thighs  . Carotid artery disease ([place])    a. Carotid US 0/93 with 81% LICA stenosis;  b. Carotid US 11/16:  Bilateral ICA 40-59% >> FU 1 year // c. Carotid US 11/17: R 40-59; L 1-39 >> follow-up 1 year  . Chronic diastolic CHF (congestive heart failure) ([place])   . CKD (chronic kidney disease) stage 4, GFR 15-29 ml/min (HCC)    Cr 1.6 in 6/11, sees Dr. Arty Baumgartner  . Contact dermatitis and other eczema, due to unspecified cause   . Esophageal reflux   . Extrinsic asthma, unspecified    hasn't used inhaler in past year  . Headache(784.0)   . hypertension   . Hypothyroidism   . Idiopathic peripheral neuropathy   . Impaired vision    pt. reports that she identifies her meds by looking at  the pillls, she is not able to read labels on bottles  . Iron deficiency anemia, unspecified   . LBBB (left bundle branch block)   . Memory loss   . Pacemaker MDT dual chamber    DOI 08/2011  . Pure hypercholesterolemia   . Severe aortic stenosis -S/P AVR     19 mm [facility] Ease pericardial tissue valve  . Type II or unspecified type diabetes mellitus without mention of complication, not stated as uncontrolled      Social History   Social History  . Marital status: Widowed    Spouse name: N/A  . Number of children: 3  . Years of education: N/A   Occupational History  . Retired Retired   Social History Main Topics  . Smoking status: Never Smoker  . Smokeless tobacco: Never Used  . Alcohol use No  . Drug use: No  . Sexual activity: No   Other Topics Concern  . Not on file   Social History Narrative   Widowed.  Lives with son in [place].             Past Surgical History:  Procedure Laterality Date  . ABDOMINAL HYSTERECTOMY  1977   partial; fibroids  . AORTIC VALVE REPLACEMENT  09/06/2011   Procedure: AORTIC VALVE REPLACEMENT (AVR);  Surgeon: Rexene Alberts, MD;  Location: [place];  Service: Open Heart Surgery;  Laterality:  N/A;  . bladder tack  1977  . CARDIAC CATHETERIZATION    . CARDIOVASCULAR STRESS TEST  10/2002   normal (per patient)  . carotid dopplers  2006  . CATARACT EXTRACTION, BILATERAL  2003  . COLONOSCOPY  04/1999   1 polyp  . COLONOSCOPY  06/2004   Neg. Int hem  . COLONOSCOPY  08/2013  . CORONARY ARTERY BYPASS GRAFT  09/06/2011   Procedure: CORONARY ARTERY BYPASS GRAFTING (CABG);  Surgeon: Rexene Alberts, MD;  Location: [place];  Service: Open Heart Surgery;  Laterality: N/A;  . DEXA  10/2003 ,09/2005   osteoporosis,  Osteopenia  . ESOPHAGEAL DILATION  1997  . ESOPHAGOGASTRODUODENOSCOPY  04/1999   HH; "watermelon stomach" (severe gastritis)  . ESOPHAGOGASTRODUODENOSCOPY  04/2002   Gastritis  . LEFT AND RIGHT HEART CATHETERIZATION WITH  CORONARY ANGIOGRAM N/A 08/11/2011   Procedure: LEFT AND RIGHT HEART CATHETERIZATION WITH CORONARY ANGIOGRAM;  Surgeon: Larey Dresser, MD;  Location: [facility] CATH LAB;  Service: Cardiovascular;  Laterality: N/A;  . Opthy  11/1998;12/01;11/02  . PERMANENT PACEMAKER INSERTION N/A 07-11-2011   Procedure: PERMANENT PACEMAKER INSERTION;  Surgeon: Deboraha Sprang, MD;  Location: [facility] CATH LAB;  Service: Cardiovascular;  Laterality: N/A;  . PTCA  [id number]   5 blockages  . REFRACTIVE SURGERY  2006    Family History  Problem Relation Age of Onset  . Stroke Mother     died in her 8's  . Stroke Father     died in his 33's  . Diabetes Father   . Prostate cancer Son   . Diabetes Son     #2  . Hypertension Son     #3  . Thyroid disease Neg Hx   . Heart attack Neg Hx     Allergies  Allergen Reactions  . Diflucan [Fluconazole] Rash    Renal Failure  . Keflex [Cephalexin]     rash  . Metformin     REACTION: intolerant  . Promethazine Hcl     REACTION: u/k    Current Outpatient Prescriptions on File Prior to Visit  Medication Sig Dispense Refill  . albuterol (PROVENTIL HFA;VENTOLIN HFA) 108 (90 BASE) MCG/ACT inhaler Inhale 2 puffs into the lungs every 6 (six) hours as needed for wheezing or shortness of breath. 1 Inhaler 0  . aspirin 81 MG EC tablet Take 1 tablet (81 mg total) by mouth daily. 30 tablet 3  . atorvastatin (LIPITOR) 40 MG tablet Take 1 tablet (40 mg total) by mouth daily. 90 tablet 1  . Blood Glucose Monitoring Suppl (ONE TOUCH ULTRA MINI) W/DEVICE KIT USE AS DIRECTED TO TEST BLOOD GLUCOSE ONCE DAILY DX: 250.00 1 each 0  . carvedilol (COREG) 25 MG tablet TAKE ONE-HALF TABLET BY MOUTH TWICE DAILY WITH A MEAL 90 tablet 0  . Cholecalciferol (VITAMIN D-3) 1000 UNITS CAPS Take 1 capsule by mouth daily.    Marland Kitchen glipiZIDE (GLUCOTROL XL) 10 MG 24 hr tablet Take 1 tablet (10 mg total) by mouth every morning. 90 tablet 0  . glucose blood test strip Use as instructed to test blood glucose once  daily Dx: E11.9 100 each 12  . levothyroxine (SYNTHROID, LEVOTHROID) 50 MCG tablet TAKE ONE TABLET BY MOUTH ONCE DAILY BEFORE BREAKFAST 90 tablet 1  . polyethylene glycol powder (GLYCOLAX/MIRALAX) powder MIX ONE CAPFUL IN LIQUID AND DRINK ONCE DAILY 765 g 3  . ramipril (ALTACE) 1.25 MG capsule TAKE ONE CAPSULE BY MOUTH ONCE DAILY 90 capsule 0   No current facility-administered  medications on file prior to visit.     BP 120/70 (BP Location: Left Arm, Patient Position: Sitting, Cuff Size: Normal)   Pulse 68   Temp 98.4 F (36.9 C) (Oral)   Ht 5' (1.524 m)   Wt 109 lb (49.4 kg)   SpO2 98%   BMI 21.29 kg/m       Objective:   Physical Exam  General  Mental Status - Alert. General Appearance - Well groomed. Not in acute distress. But looks mild fatigued.  Skin Rashes- No Rashes.  HEENT Head- Normal. Ear Auditory Canal - Left- Normal. Right - Normal.Tympanic Membrane- Left- Normal. Right- Normal. Eye Sclera/Conjunctiva- Left- Normal. Right- Normal. Nose & Sinuses Nasal Mucosa- Left-  Boggy and Congested. Right-  Boggy and  Congested.Bilateral no  maxillary and  nofrontal sinus pressure. Mouth & Throat Lips: Upper Lip- Normal: no dryness, cracking, pallor, cyanosis, or vesicular eruption. Lower Lip-Normal: no dryness, cracking, pallor, cyanosis or vesicular eruption. Buccal Mucosa- Bilateral- No Aphthous ulcers. Oropharynx- No Discharge or Erythema. Tonsils: Characteristics- Bilateral- No Erythema or Congestion. Size/Enlargement- Bilateral- No enlargement. Discharge- bilateral-None.  Neck Neck- Supple. No Masses.   Chest and Lung Exam Auscultation: Breath Sounds:-Clear even and unlabored.  Cardiovascular Auscultation:Rythm- Regular, rate and rhythm. Murmurs & Other Heart Sounds:Ausculatation of the heart reveal- No Murmurs.  Lymphatic Head & Neck General Head & Neck Lymphatics: Bilateral: Description- No Localized lymphadenopathy.       Assessment & Plan:    [phone]([id number] [place])  You appear to have bronchitis. Rest hydrate and tylenol for fever. I am prescribing cough medicine benzonatate, and doxycycline antibiotic.   Use alubuterol neb treatment for wheezing. If using every 6 hours then add tapered prednisone tablets.  Please get lab and cxr today. Will evaluate if has pneumonia. Will call later today with results.  Follow up in 7-10 days or as needed  Khayden Herzberg, Percell Miller, [employer]

## 2016-02-16 NOTE — Patient Instructions (Addendum)
   You appear to have bronchitis. Rest hydrate and tylenol for fever. I am prescribing cough medicine benzonatate, and doxycycline antibiotic.   Use alubuterol neb treatment for wheezing. If using every 6 hours then add tapered prednisone tablets.  Please get lab and cxr today. Will evaluate if has pneumonia. Will call later today with results.  Follow up in 7-10 days or as needed

## 2016-02-16 NOTE — Telephone Encounter (Signed)
Pt needs 30 minute appointment tomorrow at 2:15 pm. Please schedule that in the morning. She needs repeat lab and ekg. Very important.

## 2016-02-17 ENCOUNTER — Ambulatory Visit: Payer: Medicare Other | Admitting: Family Medicine

## 2016-02-17 ENCOUNTER — Telehealth: Payer: Self-pay | Admitting: Medical

## 2016-02-17 ENCOUNTER — Other Ambulatory Visit (INDEPENDENT_AMBULATORY_CARE_PROVIDER_SITE_OTHER): Payer: Medicare Other

## 2016-02-17 ENCOUNTER — Ambulatory Visit (INDEPENDENT_AMBULATORY_CARE_PROVIDER_SITE_OTHER): Payer: Medicare Other | Admitting: Medical

## 2016-02-17 ENCOUNTER — Other Ambulatory Visit: Payer: Self-pay | Admitting: *Deleted

## 2016-02-17 ENCOUNTER — Encounter: Payer: Self-pay | Admitting: Medical

## 2016-02-17 VITALS — BP 125/56 | HR 67 | Temp 98.1°F | Ht 61.0 in | Wt 109.0 lb

## 2016-02-17 DIAGNOSIS — E875 Hyperkalemia: Secondary | ICD-10-CM | POA: Diagnosis not present

## 2016-02-17 LAB — COMPREHENSIVE METABOLIC PANEL
ALT: 8 U/L (ref 0–35)
AST: 14 U/L (ref 0–37)
Albumin: 3.5 g/dL (ref 3.5–5.2)
Alkaline Phosphatase: 96 U/L (ref 39–117)
BUN: 39 mg/dL — ABNORMAL HIGH (ref 6–23)
CALCIUM: 9.1 mg/dL (ref 8.4–10.5)
CHLORIDE: 102 meq/L (ref 96–112)
CO2: 27 meq/L (ref 19–32)
CREATININE: 1.45 mg/dL — AB (ref 0.40–1.20)
GFR: 36.94 mL/min — ABNORMAL LOW (ref >60.00)
Glucose, Bld: 237 mg/dL — ABNORMAL HIGH (ref 70–99)
Sodium: 136 mEq/L (ref 135–145)
Total Bilirubin: 0.4 mg/dL (ref 0.2–1.2)
Total Protein: 7.4 g/dL (ref 6.0–8.3)

## 2016-02-17 MED ORDER — SODIUM POLYSTYRENE SULFONATE 15 GM/60ML PO SUSP: ORAL | 0 refills | Status: DC

## 2016-02-17 MED FILL — KIONEX 15 GM/60 ML SUS: 15 | 3 days supply | Qty: 360 | Fill #0

## 2016-02-17 NOTE — Patient Instructions (Addendum)
For your elevated potassium will rx kayexalate suspension to use over next 3 days.  Stop eating bananas until we recheck your potassium.  I wanted to check potassium level on Monday but transportation is issue. May try to get home health to draw lab on Monday. If not the when in for follow up with pcp then  repeat cmp.  ekg done today did not show any obvious acute changes from sept 2016 ekg.Best one to compare to. No peaked t waves.  If while family not in town and you have question can call my cell phone [phone]. If any severe signs or symptoms after hours then ED evaluation.

## 2016-02-17 NOTE — Telephone Encounter (Signed)
Med sent and attached on today visit note.

## 2016-02-17 NOTE — Progress Notes (Signed)
Subjective:    Patient ID: Barbara Garner, female    DOB: May 15, 1934, 81 y.o.   MRN: [id number]  HPI  Pt in for follow up due to high k. Pt has no chest pain or palpitations. No sob. Pt feels mild better than yesterday.   Pt eats a banana about every other day or every day.  I wanted to bring her back to get ekg and repeat her k level. k down from 6.2 to 5.6 since yesterday.  Needed her to follow up for this and her relatives are going out of town this weekend. Relative states no easy way for her to be seen next week.     Review of Systems  Constitutional: Negative for chills, fatigue and fever.  Cardiovascular: Negative for chest pain and palpitations.  Gastrointestinal: Negative for abdominal pain.  Musculoskeletal: Negative for back pain.  Skin: Negative for rash.  Neurological: Negative for dizziness, syncope, weakness and headaches.  Hematological: Negative for adenopathy. Does not bruise/bleed easily.  Psychiatric/Behavioral: Negative for behavioral problems and confusion.    Past Medical History:  Diagnosis Date  . Atrioventricular block, complete -intermittent   . CAD  S/P CABG x 3 08/2011    LIMA to diagonal branch, SVG to OM1, SVG to PDA, EVH via bilateral thighs  . Carotid artery disease ([place])    a. Carotid US 6/75 with 91% LICA stenosis;  b. Carotid US 11/16:  Bilateral ICA 40-59% >> FU 1 year // c. Carotid US 11/17: R 40-59; L 1-39 >> follow-up 1 year  . Chronic diastolic CHF (congestive heart failure) ([place])   . CKD (chronic kidney disease) stage 4, GFR 15-29 ml/min (HCC)    Cr 1.6 in 6/11, sees Dr. Arty Baumgartner  . Contact dermatitis and other eczema, due to unspecified cause   . Esophageal reflux   . Extrinsic asthma, unspecified    hasn't used inhaler in past year  . Headache(784.0)   . hypertension   . Hypothyroidism   . Idiopathic peripheral neuropathy   . Impaired vision    pt. reports that she identifies her meds by looking at the pillls, she is not  able to read labels on bottles  . Iron deficiency anemia, unspecified   . LBBB (left bundle branch block)   . Memory loss   . Pacemaker MDT dual chamber    DOI 08/2011  . Pure hypercholesterolemia   . Severe aortic stenosis -S/P AVR     19 mm [facility] Ease pericardial tissue valve  . Type II or unspecified type diabetes mellitus without mention of complication, not stated as uncontrolled      Social History   Social History  . Marital status: Widowed    Spouse name: N/A  . Number of children: 3  . Years of education: N/A   Occupational History  . Retired Retired   Social History Main Topics  . Smoking status: Never Smoker  . Smokeless tobacco: Never Used  . Alcohol use No  . Drug use: No  . Sexual activity: No   Other Topics Concern  . Not on file   Social History Narrative   Widowed.  Lives with son in [place].             Past Surgical History:  Procedure Laterality Date  . ABDOMINAL HYSTERECTOMY  1977   partial; fibroids  . AORTIC VALVE REPLACEMENT  09/06/2011   Procedure: AORTIC VALVE REPLACEMENT (AVR);  Surgeon: Rexene Alberts, MD;  Location: [place];  Service: Open Heart Surgery;  Laterality: N/A;  . bladder tack  1977  . CARDIAC CATHETERIZATION    . CARDIOVASCULAR STRESS TEST  10/2002   normal (per patient)  . carotid dopplers  2006  . CATARACT EXTRACTION, BILATERAL  2003  . COLONOSCOPY  04/1999   1 polyp  . COLONOSCOPY  06/2004   Neg. Int hem  . COLONOSCOPY  08/2013  . CORONARY ARTERY BYPASS GRAFT  09/06/2011   Procedure: CORONARY ARTERY BYPASS GRAFTING (CABG);  Surgeon: Rexene Alberts, MD;  Location: [place];  Service: Open Heart Surgery;  Laterality: N/A;  . DEXA  10/2003 ,09/2005   osteoporosis,  Osteopenia  . ESOPHAGEAL DILATION  1997  . ESOPHAGOGASTRODUODENOSCOPY  04/1999   HH; "watermelon stomach" (severe gastritis)  . ESOPHAGOGASTRODUODENOSCOPY  04/2002   Gastritis  . LEFT AND RIGHT HEART CATHETERIZATION WITH CORONARY ANGIOGRAM N/A  08/11/2011   Procedure: LEFT AND RIGHT HEART CATHETERIZATION WITH CORONARY ANGIOGRAM;  Surgeon: Larey Dresser, MD;  Location: [facility] CATH LAB;  Service: Cardiovascular;  Laterality: N/A;  . Opthy  11/1998;12/01;11/02  . PERMANENT PACEMAKER INSERTION N/A 08/18/202013   Procedure: PERMANENT PACEMAKER INSERTION;  Surgeon: Deboraha Sprang, MD;  Location: [facility] CATH LAB;  Service: Cardiovascular;  Laterality: N/A;  . PTCA  [id number]   5 blockages  . REFRACTIVE SURGERY  2006    Family History  Problem Relation Age of Onset  . Stroke Mother     died in her 4's  . Stroke Father     died in his 32's  . Diabetes Father   . Prostate cancer Son   . Diabetes Son     #2  . Hypertension Son     #3  . Thyroid disease Neg Hx   . Heart attack Neg Hx     Allergies  Allergen Reactions  . Diflucan [Fluconazole] Rash    Renal Failure  . Keflex [Cephalexin]     rash  . Metformin     REACTION: intolerant  . Promethazine Hcl     REACTION: u/k    Current Outpatient Prescriptions on File Prior to Visit  Medication Sig Dispense Refill  . albuterol (PROVENTIL HFA;VENTOLIN HFA) 108 (90 BASE) MCG/ACT inhaler Inhale 2 puffs into the lungs every 6 (six) hours as needed for wheezing or shortness of breath. 1 Inhaler 0  . aspirin 81 MG EC tablet Take 1 tablet (81 mg total) by mouth daily. 30 tablet 3  . atorvastatin (LIPITOR) 40 MG tablet Take 1 tablet (40 mg total) by mouth daily. 90 tablet 1  . benzonatate (TESSALON) 100 MG capsule Take 1 capsule (100 mg total) by mouth 3 (three) times daily as needed for cough. 21 capsule 0  . Blood Glucose Monitoring Suppl (ONE TOUCH ULTRA MINI) W/DEVICE KIT USE AS DIRECTED TO TEST BLOOD GLUCOSE ONCE DAILY DX: 250.00 1 each 0  . carvedilol (COREG) 25 MG tablet TAKE ONE-HALF TABLET BY MOUTH TWICE DAILY WITH A MEAL 90 tablet 0  . Cholecalciferol (VITAMIN D-3) 1000 UNITS CAPS Take 1 capsule by mouth daily.    Marland Kitchen doxycycline (VIBRA-TABS) 100 MG tablet Take 1 tablet (100 mg total)  by mouth 2 (two) times daily. Can give caps or generic 20 tablet 0  . glipiZIDE (GLUCOTROL XL) 10 MG 24 hr tablet Take 1 tablet (10 mg total) by mouth every morning. 90 tablet 0  . glucose blood test strip Use as instructed to test blood glucose once daily Dx: E11.9 100 each 12  .  levothyroxine (SYNTHROID, LEVOTHROID) 50 MCG tablet TAKE ONE TABLET BY MOUTH ONCE DAILY BEFORE BREAKFAST 90 tablet 1  . polyethylene glycol powder (GLYCOLAX/MIRALAX) powder MIX ONE CAPFUL IN LIQUID AND DRINK ONCE DAILY 765 g 3  . predniSONE (DELTASONE) 10 MG tablet 5 tab po day 1, 4 tab po day 2, 3 tab po day 3, 2 tab po day 4 ,1 tab po day 5 15 tablet 0  . ramipril (ALTACE) 1.25 MG capsule TAKE ONE CAPSULE BY MOUTH ONCE DAILY 90 capsule 0   No current facility-administered medications on file prior to visit.     BP (!) 125/56 (BP Location: Left Arm, Patient Position: Sitting, Cuff Size: Normal)   Pulse 67   Temp 98.1 F (36.7 C) (Oral)   Ht '5\' 1"'  (1.549 m)   Wt 109 lb (49.4 kg)   SpO2 98%   BMI 20.60 kg/m       Objective:   Physical Exam  General- No acute distress. Pleasant patient. Neck- Full range of motion, no jvd Lungs- Clear, even and unlabored. Heart- regular rate and rhythm. Neurologic- CNII- XII grossly intact.       Assessment & Plan:  For your elevated potassium will rx kayexalate suspension to use over next 3 days.  Stop eating bananas until we recheck your potassium.  Not also advise to stop ramipril. Gfr is low and k is high. Her nephrologist was going to do this and had mentioned this to pt on last visit.  I wanted to check potassium level on Monday but transportation is issue. May try to get home health to draw lab on Monday. If not the when in for follow up with pcp then  repeat cmp.  ekg done today did not show any obvious acute changes from sept 2016 ekg.Best one to compare to. No peaked t waves.  If while family not in town and you have question can call my cell phone  [phone]. If any severe signs or symptoms after hours then ED evaluation.  Olar Santini, Percell Miller, PA-C

## 2016-02-17 NOTE — Progress Notes (Signed)
Pre visit review using our clinic review tool, if applicable. No additional management support is needed unless otherwise documented below in the visit note. 

## 2016-02-17 NOTE — Telephone Encounter (Signed)
Barbara Garner scheduled appt this morning at 8:10am - pt is coming in today at 3:00pm and is for 30 minutes

## 2016-02-17 NOTE — Telephone Encounter (Signed)
I put in the home health order. Want them to go out Monday and draw bmp to check potassium level. Have them run test stat on February 21, 2016 and fax results to our office. Thanks.

## 2016-02-21 LAB — HEPATIC FUNCTION PANEL
ALT: 10 U/L (ref 7–35)
AST: 16 U/L (ref 13–35)
Alkaline Phosphatase: 121 U/L (ref 25–125)
BILIRUBIN, TOTAL: 0.2 mg/dL

## 2016-02-21 LAB — BASIC METABOLIC PANEL
BUN: 25 mg/dL — AB (ref 4–21)
Creatinine: 1 mg/dL (ref 0.5–1.1)
GLUCOSE: 144 mg/dL
Potassium: 3.7 mmol/L (ref 3.4–5.3)
SODIUM: 143 mmol/L (ref 137–147)

## 2016-03-01 ENCOUNTER — Encounter: Payer: Self-pay | Admitting: Family Medicine

## 2016-03-01 ENCOUNTER — Ambulatory Visit (INDEPENDENT_AMBULATORY_CARE_PROVIDER_SITE_OTHER): Payer: Medicare Other | Admitting: Family Medicine

## 2016-03-01 VITALS — BP 116/58 | HR 74 | Temp 97.7°F | Ht 61.0 in | Wt 111.0 lb

## 2016-03-01 DIAGNOSIS — Z0000 Encounter for general adult medical examination without abnormal findings: Secondary | ICD-10-CM | POA: Diagnosis not present

## 2016-03-01 DIAGNOSIS — E034 Atrophy of thyroid (acquired): Secondary | ICD-10-CM

## 2016-03-01 DIAGNOSIS — N184 Chronic kidney disease, stage 4 (severe): Secondary | ICD-10-CM | POA: Diagnosis not present

## 2016-03-01 DIAGNOSIS — E1142 Type 2 diabetes mellitus with diabetic polyneuropathy: Secondary | ICD-10-CM | POA: Diagnosis not present

## 2016-03-01 DIAGNOSIS — E785 Hyperlipidemia, unspecified: Secondary | ICD-10-CM

## 2016-03-01 DIAGNOSIS — E875 Hyperkalemia: Secondary | ICD-10-CM | POA: Diagnosis not present

## 2016-03-01 DIAGNOSIS — I10 Essential (primary) hypertension: Secondary | ICD-10-CM

## 2016-03-01 LAB — LIPID PANEL
CHOL/HDL RATIO: 3
CHOLESTEROL: 109 mg/dL (ref 0–200)
HDL: 35 mg/dL — ABNORMAL LOW (ref >39.00)
LDL CALC: 40 mg/dL (ref 0–99)
NONHDL: 73.95
Triglycerides: 168 mg/dL — ABNORMAL HIGH (ref 0.0–149.0)
VLDL: 33.6 mg/dL (ref 0.0–40.0)

## 2016-03-01 LAB — BASIC METABOLIC PANEL
BUN: 40 mg/dL — AB (ref 6–23)
CHLORIDE: 104 meq/L (ref 96–112)
CO2: 28 meq/L (ref 19–32)
CREATININE: 1.15 mg/dL (ref 0.40–1.20)
Calcium: 8.9 mg/dL (ref 8.4–10.5)
GFR: 48.27 mL/min — ABNORMAL LOW (ref >60.00)
GLUCOSE: 110 mg/dL — AB (ref 70–99)
Potassium: 4.7 mEq/L (ref 3.5–5.1)
Sodium: 139 mEq/L (ref 135–145)

## 2016-03-01 LAB — TSH: TSH: 0.1 u[IU]/mL — AB (ref 0.35–4.50)

## 2016-03-01 LAB — HEMOGLOBIN A1C: HEMOGLOBIN A1C: 6.9 % — AB (ref 4.6–6.5)

## 2016-03-01 MED ORDER — ATORVASTATIN CALCIUM 40 MG PO TABS: 40.0000 mg | ORAL_TABLET | Freq: Every day | ORAL | 3 refills | Status: DC

## 2016-03-01 MED ORDER — GLIPIZIDE ER 10 MG PO TB24: ORAL_TABLET | ORAL | 3 refills | Status: DC

## 2016-03-01 MED ORDER — LEVOTHYROXINE SODIUM 50 MCG PO TABS: ORAL_TABLET | ORAL | 3 refills | Status: DC

## 2016-03-01 NOTE — Progress Notes (Signed)
Subjective:   Barbara Garner is a 81 y.o. female who presents for Medicare Annual (Subsequent) preventive examination.  Review of Systems:  No ROS.  Medicare Wellness Visit.  Cardiac Risk Factors include: advanced age (>67mn, >>78women);diabetes mellitus;dyslipidemia;hypertension;smoking/ tobacco exposure  Sleep patterns: no sleep issues. Does not get up to void.   Home Safety/Smoke Alarms: Feels safe in home. Smoke alarms in place.  Living environment; residence and Firearm Safety: Lives w/ 2 sons, daughter-in-law, and great grandson. split level / walkout, number of inside stairs: 1, equipment: Cane, Type: [place] Seat Belt Safety/Bike Helmet: Wears seat belt.   Counseling:   Eye Exam- Dr. RAntionette Fairyyearly. Dental- No dentist. Dentures upper and lower. Well fitting, no dental issues reported.  Female:   Pap- N/A due to age and hysterectomy       Mammo- Pt no longer receives.      Dexa scan- Daughter in law states has had once before, not sure when. Will not order today since not sure if > 2 years since last scan.     CCS- last 09/03/13 w/ Dr. DOwens Loffler Diverticulosis, otherwise normal. No further routine screening needed due to age.     Objective:     Vitals: BP (!) 116/58   Pulse 74   Temp 97.7 F (36.5 C) (Oral)   Ht 5' 1" (1.549 m)   Wt 111 lb (50.3 kg)   SpO2 99%   BMI 20.97 kg/m   Body mass index is 20.97 kg/m.   Tobacco History  Smoking Status  . Never Smoker  Smokeless Tobacco  . Never Used     Counseling given: Not Answered   Past Medical History:  Diagnosis Date  . Atrioventricular block, complete -intermittent   . CAD  S/P CABG x 3 08/2011    LIMA to diagonal branch, SVG to OM1, SVG to PDA, EVH via bilateral thighs  . Carotid artery disease ([place]    a. Carotid [place] 498%LICA stenosis;  b. Carotid [place]:  Bilateral ICA 40-59% >> FU 1 year // c. Carotid [place]17: R 40-59; L 1-39 >> follow-up 1 year  . Chronic diastolic  CHF (congestive heart failure) ([place]   . CKD (chronic kidney disease) stage 4, GFR 15-29 ml/min (HCC)    Cr 1.6 in 6/11, sees Dr. CArty Baumgartner . Contact dermatitis and other eczema, due to unspecified cause   . Esophageal reflux   . Extrinsic asthma, unspecified    hasn't used inhaler in past year  . Headache(784.0)   . hypertension   . Hypothyroidism   . Idiopathic peripheral neuropathy   . Impaired vision    pt. reports that she identifies her meds by looking at the pillls, she is not able to read labels on bottles  . Iron deficiency anemia, unspecified   . LBBB (left bundle branch block)   . Memory loss   . Pacemaker MDT dual chamber    DOI 08/2011  . Pure hypercholesterolemia   . Severe aortic stenosis -S/P AVR     19 mm [facility] pericardial tissue valve  . Type II or unspecified type diabetes mellitus without mention of complication, not stated as uncontrolled    Past Surgical History:  Procedure Laterality Date  . ABDOMINAL HYSTERECTOMY  1977   partial; fibroids  . AORTIC VALVE REPLACEMENT  09/06/2011   Procedure: AORTIC VALVE REPLACEMENT (AVR);  Surgeon: CRexene Alberts MD;  [place]  Service: Open  Heart Surgery;  Laterality: N/A;  . bladder tack  1977  . CARDIAC CATHETERIZATION    . CARDIOVASCULAR STRESS TEST  10/2002   normal (per patient)  . carotid dopplers  2006  . CATARACT EXTRACTION, BILATERAL  2003  . COLONOSCOPY  04/1999   1 polyp  . COLONOSCOPY  06/2004   Neg. Int hem  . COLONOSCOPY  08/2013  . CORONARY ARTERY BYPASS GRAFT  09/06/2011   Procedure: CORONARY ARTERY BYPASS GRAFTING (CABG);  Surgeon: Rexene Alberts, MD;  Location: [place];  Service: Open Heart Surgery;  Laterality: N/A;  . DEXA  10/2003 ,09/2005   osteoporosis,  Osteopenia  . ESOPHAGEAL DILATION  1997  . ESOPHAGOGASTRODUODENOSCOPY  04/1999   HH; "watermelon stomach" (severe gastritis)  . ESOPHAGOGASTRODUODENOSCOPY  04/2002   Gastritis  . LEFT AND RIGHT HEART CATHETERIZATION WITH  CORONARY ANGIOGRAM N/A 08/11/2011   Procedure: LEFT AND RIGHT HEART CATHETERIZATION WITH CORONARY ANGIOGRAM;  Surgeon: Larey Dresser, MD;  Location: [facility] C[facility] LAB;  Service: Cardiovascular;  Laterality: N/A;  . Opthy  11/1998;12/01;11/02  . PERMANENT PACEMAKER INSERTION N/A 2020/02/2612   Procedure: PERMANENT PACEMAKER INSERTION;  Surgeon: Deboraha Sprang, MD;  [facility] C[facility] LAB;  Service: Cardiovascular;  Laterality: N/A;  . PTCA  [id number]   5 blockages  . REFRACTIVE SURGERY  2006   Family History  Problem Relation Age of Onset  . Stroke Mother     died in her 37's  . Stroke Father     died in his 74's  . Diabetes Father   . Prostate cancer Son   . Diabetes Son     #2  . Hypertension Son     #3  . Thyroid disease Neg Hx   . Heart attack Neg Hx    History  Sexual Activity  . Sexual activity: No    Outpatient Encounter Prescriptions as of 03/01/2016  Medication Sig  . albuterol (PROVENTIL HFA;VENTOLIN HFA) 108 (90 BASE) MCG/ACT inhaler Inhale 2 puffs into the lungs every 6 (six) hours as needed for wheezing or shortness of breath.  Marland Kitchen aspirin 81 MG EC tablet Take 1 tablet (81 mg total) by mouth daily.  Marland Kitchen atorvastatin (LIPITOR) 40 MG tablet Take 1 tablet (40 mg total) by mouth daily.  . Blood Glucose Monitoring Suppl (ONE TOUCH ULTRA MINI) W/DEVICE KIT USE AS DIRECTED TO TEST BLOOD GLUCOSE ONCE DAILY DX: 250.00  . carvedilol (COREG) 25 MG tablet TAKE ONE-HALF TABLET BY MOUTH TWICE DAILY WITH A MEAL  . Cholecalciferol (VITAMIN D-3) 1000 UNITS CAPS Take 1 capsule by mouth daily.  Marland Kitchen glipiZIDE (GLUCOTROL XL) 10 MG 24 hr tablet Take 1/2 tablet (5 Mg) by mouth daily.  Marland Kitchen glucose blood test strip Use as instructed to test blood glucose once daily Dx: E11.9  . levothyroxine (SYNTHROID, LEVOTHROID) 50 MCG tablet TAKE ONE TABLET BY MOUTH ONCE DAILY BEFORE BREAKFAST  . polyethylene glycol powder (GLYCOLAX/MIRALAX) powder MIX ONE CAPFUL IN LIQUID AND DRINK ONCE DAILY  . predniSONE (DELTASONE)  10 MG tablet 5 tab po day 1, 4 tab po day 2, 3 tab po day 3, 2 tab po day 4 ,1 tab po day 5  . [DISCONTINUED] atorvastatin (LIPITOR) 40 MG tablet Take 1 tablet (40 mg total) by mouth daily.  . [DISCONTINUED] glipiZIDE (GLUCOTROL XL) 10 MG 24 hr tablet Take 1 tablet (10 mg total) by mouth every morning. (Patient taking differently: Take 10 mg by mouth. Take 1/2 tablet (5 Mg) by mouth daily.)  . [  DISCONTINUED] levothyroxine (SYNTHROID, LEVOTHROID) 50 MCG tablet TAKE ONE TABLET BY MOUTH ONCE DAILY BEFORE BREAKFAST  . [DISCONTINUED] benzonatate (TESSALON) 100 MG capsule Take 1 capsule (100 mg total) by mouth 3 (three) times daily as needed for cough. (Patient not taking: Reported on 03/01/2016)  . [DISCONTINUED] doxycycline (VIBRA-TABS) 100 MG tablet Take 1 tablet (100 mg total) by mouth 2 (two) times daily. Can give caps or generic (Patient not taking: Reported on 03/01/2016)  . [DISCONTINUED] ramipril (ALTACE) 1.25 MG capsule TAKE ONE CAPSULE BY MOUTH ONCE DAILY (Patient not taking: Reported on 03/01/2016)  . [DISCONTINUED] sodium polystyrene (KAYEXALATE) 15 GM/60ML suspension 60 ml twice daily for 3 days (Patient not taking: Reported on 03/01/2016)   No facility-administered encounter medications on file as of 03/01/2016.     Activities of Daily Living In your present state of health, do you have any difficulty performing the following activities: 03/01/2016  Hearing? N  Vision? N  Difficulty concentrating or making decisions? N  Walking or climbing stairs? N  Dressing or bathing? N  Doing errands, shopping? Y  Preparing Food and eating ? Y  Using the Toilet? N  In the past six months, have you accidently leaked urine? N  Do you have problems with loss of bowel control? N  Managing your Medications? Y  Managing your Finances? Y  Housekeeping or managing your Housekeeping? Y  Some recent data might be hidden    Patient Care Team: Darreld Mclean, MD as PCP - General (Family  Medicine) Donato Heinz, MD as Consulting Physician (Nephrology) Noralyn Pick, MD as Referring Physician (Ophthalmology) Larey Dresser, MD as Consulting Physician (Cardiology)    Assessment:    No ROS.  Medicare Wellness Visit.  Exercise Activities and Dietary recommendations Current Exercise Habits: The patient does not participate in regular exercise at present   Diet (meal preparation, eat out, water intake, caffeinated beverages, dairy products, fruits and vegetables): on average, 3 meals per day. Daughter-in-law cooks/prepares meals. Eats most meals at home. Pt states she eats whatever she wants but it not able to provide more specifics.   Goals    . Continue taking care of dog    . Follow up with specialist as directed.  (pt-stated)      Fall Risk Fall Risk  03/01/2016 02/24/2015  Falls in the past year? No Yes  Number falls in past yr: - 1  Injury with Fall? - No   Depression Screen PHQ 2/9 Scores 03/01/2016 02/24/2015 05/25/2014  PHQ - 2 Score 0 0 0     Cognitive Function MMSE - Mini Mental State Exam 03/01/2016 02/24/2015  Not completed: Refused -  Orientation to time - 0  Registration - 3  Attention/ Calculation - 5  Recall - 0  Language- name 2 objects - 2  Language- repeat - 1  Language- follow 3 step command - 3  Language- read & follow direction - 0  Language-read & follow direction-comments - unable to see   Write a sentence - 0  Write a sentence-comments - unable to see  Copy design - (No Data)  Copy design-comments - unable to see        Immunization History  Administered Date(s) Administered  . Influenza Split 05/11/2011  . Influenza Whole 12/18/2005, 01/20/2009, 01/05/2010  . Influenza,inj,Quad PF,36+ Mos 11/26/2012, 03/12/2014, 02/24/2015  . Pneumococcal Polysaccharide-23 08/27/2008  . Td 05/18/2005   Screening Tests Health Maintenance  Topic Date Due  . ZOSTAVAX  04/15/1996  . OPHTHALMOLOGY EXAM  03/01/2012  . TETANUS/TDAP   05/19/2015  . HEMOGLOBIN A1C  02/17/2016  . DEXA SCAN  05/30/2016 (Originally 04/15/2001)  . PNA vac Low Risk Adult (2 of 2 - PCV13) 05/30/2016 (Originally 08/27/2009)  . FOOT EXAM  03/01/2017  . INFLUENZA VACCINE  Completed      Plan:    Bring a copy of your advance directives to your next office visit. Schedule follow-up appt w/ podiatry. Declined PCV13 today-they would like to double check to make sure she has not already had this w/ another provider.  During the course of the visit the patient was educated and counseled about the following appropriate screening and preventive services:   Vaccines to include Pneumoccal, Influenza, Hepatitis B, Td, Zostavax, HCV  Cardiovascular Disease  Bone density screening  Diabetes screening  Glaucoma screening  Mammography/PAP  Nutrition counseling   Patient Instructions (the written plan) was given to the patient.   Dorrene German, RN  03/01/2016

## 2016-03-01 NOTE — Patient Instructions (Addendum)
It was a pleasure to see you today I will be in touch with your labs- we will make sure that your potassium look ok. If it is normal we can have home health recheck this in one month I will also monitor your thyroid and diabetes today Please come and see me in 3-4 months unless you need me sooner At this time I do not see any evidence of thrush- however if you do not see this please call me

## 2016-03-01 NOTE — Progress Notes (Signed)
Pre visit review using our clinic review tool, if applicable. No additional management support is needed unless otherwise documented below in the visit note. 

## 2016-03-01 NOTE — Progress Notes (Addendum)
[place] at [facility] [address], [place], [place] [address] [phone]  Date:  03/01/2016   Name:  Barbara Garner   DOB:  July 04, 1934   MRN:  [id number]  PCP:  Leeanne Rio, PA-C    Chief Complaint: Transfer of Care   History of Present Illness:  Barbara Garner is a 81 y.o. very pleasant female patient who presents with the following:  Here today as a new patient to my practice.  Her PCP Elyn Aquas has moved to a new location We have recently been following her K closely- elevated at the last few lab dreaws Most recent K was improved.  Here today with her DIL Marie Borowski  Only med change is that her nephrologist stop her ramipril which helped to bring her K down Her nephrologist is Dr. Vanita Panda  She was given prednisone at last sick visit but did not have to take it.    She is not known to have thrush in the past.  Helene Kelp reports that lately Ayeisha has seemed less interested in eating healthier foods and only has an appetite for treats.  Her home health aid suggested that she might have thrush causing mouth pain.  However they have not seen any redness or plaques in her mouth   Helene Kelp tries to encourage a healthy diet to control her DM.  Her A1c is generally 6- 7%.   She is allergic to diflucan  Wt Readings from Last 3 Encounters:  03/01/16 111 lb (50.3 kg)  02/17/16 109 lb (49.4 kg)  02/16/16 109 lb (49.4 kg)   Lab Results  Component Value Date   TSH 2.16 06/30/2015   She is due for a thyroid check  Patient Active Problem List   Diagnosis Date Noted  . Thyroid activity decreased 05/14/2015  . Postmenopausal estrogen deficiency 03/10/2015  . Pain in lower limb 03/08/2015  . Left knee pain 08/25/2014  . Hyperkalemia 05/25/2014  . CN (constipation) 05/08/2014  . Diabetes mellitus type 2, controlled ([place]) 03/11/2014  . Dementia 12/17/2013  . Memory loss, short term 10/29/2013  . Low back pain  09/24/2013  . Sinus bradycardia 09/24/2012  . Vitamin D deficiency disease 08/23/2012  . Lichen sclerosus et atrophicus of the vulva 04/05/2012  . Chronic diastolic CHF (congestive heart failure) ([place]) 11/15/2011  . Pacemaker-Medtronic 09/13/2011  . Atrial fibrillation ([place]) 09/12/2011  . Complete heart block-intermittent 09/09/2011  . S/P aortic valve replacement 09/06/2011  . S/P CABG x 3 09/06/2011  . IVCD (intraventricular conduction defect)   . CKD (chronic kidney disease) stage 4, GFR 15-29 ml/min (HCC)   . Carotid stenosis 05/30/2011  . Squamous cell carcinoma of skin 07/21/2010  . MEMORY LOSS 08/19/2009  . HYPERCHOLESTEROLEMIA, PURE 09/20/2006  . ANEMIA-IRON DEFICIENCY 06/27/2006  . PERIPHERAL NEUROPATHY 06/27/2006  . Essential hypertension 06/27/2006  . MYOCARDIAL INFARCTION, HX OF 06/27/2006  . CORONARY ARTERY DISEASE 06/27/2006  . ASTHMA 06/27/2006  . PNEUMONIA, HX OF 06/27/2006  . ECZEMA 06/27/2006    Past Medical History:  Diagnosis Date  . Atrioventricular block, complete -intermittent   . CAD  S/P CABG x 3 08/2011    LIMA to diagonal branch, SVG to OM1, SVG to PDA, EVH via bilateral thighs  . Carotid artery disease ([place])    a. Carotid US 2/70 with 35% LICA stenosis;  b. Carotid US 11/16:  Bilateral ICA 40-59% >> FU 1 year // c. Carotid US 11/17: R  40-59; L 1-39 >> follow-up 1 year  . Chronic diastolic CHF (congestive heart failure) ([place])   . CKD (chronic kidney disease) stage 4, GFR 15-29 ml/min (HCC)    Cr 1.6 in 6/11, sees Dr. Arty Baumgartner  . Contact dermatitis and other eczema, due to unspecified cause   . Esophageal reflux   . Extrinsic asthma, unspecified    hasn't used inhaler in past year  . Headache(784.0)   . hypertension   . Hypothyroidism   . Idiopathic peripheral neuropathy   . Impaired vision    pt. reports that she identifies her meds by looking at the pillls, she is not able to read labels on bottles  . Iron deficiency anemia, unspecified   .  LBBB (left bundle branch block)   . Memory loss   . Pacemaker MDT dual chamber    DOI 08/2011  . Pure hypercholesterolemia   . Severe aortic stenosis -S/P AVR     19 mm [facility] Series Ease pericardial tissue valve  . Type II or unspecified type diabetes mellitus without mention of complication, not stated as uncontrolled     Past Surgical History:  Procedure Laterality Date  . ABDOMINAL HYSTERECTOMY  1977   partial; fibroids  . AORTIC VALVE REPLACEMENT  09/06/2011   Procedure: AORTIC VALVE REPLACEMENT (AVR);  Surgeon: Rexene Alberts, MD;  Location: [place];  Service: Open Heart Surgery;  Laterality: N/A;  . bladder tack  1977  . CARDIAC CATHETERIZATION    . CARDIOVASCULAR STRESS TEST  10/2002   normal (per patient)  . carotid dopplers  2006  . CATARACT EXTRACTION, BILATERAL  2003  . COLONOSCOPY  04/1999   1 polyp  . COLONOSCOPY  06/2004   Neg. Int hem  . COLONOSCOPY  08/2013  . CORONARY ARTERY BYPASS GRAFT  09/06/2011   Procedure: CORONARY ARTERY BYPASS GRAFTING (CABG);  Surgeon: Rexene Alberts, MD;  Location: [place];  Service: Open Heart Surgery;  Laterality: N/A;  . DEXA  10/2003 ,09/2005   osteoporosis,  Osteopenia  . ESOPHAGEAL DILATION  1997  . ESOPHAGOGASTRODUODENOSCOPY  04/1999   HH; "watermelon stomach" (severe gastritis)  . ESOPHAGOGASTRODUODENOSCOPY  04/2002   Gastritis  . LEFT AND RIGHT HEART CATHETERIZATION WITH CORONARY ANGIOGRAM N/A 08/11/2011   Procedure: LEFT AND RIGHT HEART CATHETERIZATION WITH CORONARY ANGIOGRAM;  Surgeon: Larey Dresser, MD;  Location: [facility] CATH LAB;  Service: Cardiovascular;  Laterality: N/A;  . Opthy  11/1998;12/01;11/02  . PERMANENT PACEMAKER INSERTION N/A 2020-11-1611   Procedure: PERMANENT PACEMAKER INSERTION;  Surgeon: Deboraha Sprang, MD;  Location: [facility] CATH LAB;  Service: Cardiovascular;  Laterality: N/A;  . PTCA  [id number]   5 blockages  . REFRACTIVE SURGERY  2006    Social History  Substance Use Topics  . Smoking status: Never Smoker  .  Smokeless tobacco: Never Used  . Alcohol use No    Family History  Problem Relation Age of Onset  . Stroke Mother     died in her 59's  . Stroke Father     died in his 77's  . Diabetes Father   . Prostate cancer Son   . Diabetes Son     #2  . Hypertension Son     #3  . Thyroid disease Neg Hx   . Heart attack Neg Hx     Allergies  Allergen Reactions  . Diflucan [Fluconazole] Rash    Renal Failure  . Keflex [Cephalexin]     rash  . Metformin  REACTION: intolerant  . Promethazine Hcl     REACTION: u/k    Medication list has been reviewed and updated.  Current Outpatient Prescriptions on File Prior to Visit  Medication Sig Dispense Refill  . albuterol (PROVENTIL HFA;VENTOLIN HFA) 108 (90 BASE) MCG/ACT inhaler Inhale 2 puffs into the lungs every 6 (six) hours as needed for wheezing or shortness of breath. 1 Inhaler 0  . aspirin 81 MG EC tablet Take 1 tablet (81 mg total) by mouth daily. 30 tablet 3  . atorvastatin (LIPITOR) 40 MG tablet Take 1 tablet (40 mg total) by mouth daily. 90 tablet 1  . Blood Glucose Monitoring Suppl (ONE TOUCH ULTRA MINI) W/DEVICE KIT USE AS DIRECTED TO TEST BLOOD GLUCOSE ONCE DAILY DX: 250.00 1 each 0  . carvedilol (COREG) 25 MG tablet TAKE ONE-HALF TABLET BY MOUTH TWICE DAILY WITH A MEAL 90 tablet 0  . Cholecalciferol (VITAMIN D-3) 1000 UNITS CAPS Take 1 capsule by mouth daily.    Marland Kitchen glipiZIDE (GLUCOTROL XL) 10 MG 24 hr tablet Take 1 tablet (10 mg total) by mouth every morning. (Patient taking differently: Take 10 mg by mouth. Take 1/2 tablet (5 Mg) by mouth daily.) 90 tablet 0  . glucose blood test strip Use as instructed to test blood glucose once daily Dx: E11.9 100 each 12  . levothyroxine (SYNTHROID, LEVOTHROID) 50 MCG tablet TAKE ONE TABLET BY MOUTH ONCE DAILY BEFORE BREAKFAST 90 tablet 1  . polyethylene glycol powder (GLYCOLAX/MIRALAX) powder MIX ONE CAPFUL IN LIQUID AND DRINK ONCE DAILY 765 g 3  . predniSONE (DELTASONE) 10 MG tablet 5 tab  po day 1, 4 tab po day 2, 3 tab po day 3, 2 tab po day 4 ,1 tab po day 5 15 tablet 0   No current facility-administered medications on file prior to visit.     Review of Systems:  As per HPI- otherwise negative. BP Readings from Last 3 Encounters:  03/01/16 (!) 116/58  02/17/16 (!) 125/56  02/16/16 120/70      Physical Examination: Blood pressure (!) 116/58, pulse 74, temperature 97.7 F (36.5 C), temperature source Oral, height '5\' 1"'  (1.549 m), weight 111 lb (50.3 kg), SpO2 99 %.  GEN: WDWN, NAD, Non-toxic, appears older than age.  Aslyn does not say much but will answer questions when prompted HEENT: Atraumatic, Normocephalic. Neck supple. No masses, No LAD. Ears and Nose: No external deformity. CV: RRR, No M/G/R. No JVD. No thrill. No extra heart sounds. PULM: CTA B, no wheezes, crackles, rhonchi. No retractions. No resp. distress. No accessory muscle use. ABD: S, NT, ND, +BS. No rebound. No HSM. EXTR: No c/c/e NEURO Normal gait.  PSYCH: Normally interactive. Conversant. Not depressed or anxious appearing.  Calm demeanor.   Assessment and Plan: Essential hypertension  Controlled type 2 diabetes mellitus with diabetic polyneuropathy, without long-term current use of insulin (HCC) - Plan: glipiZIDE (GLUCOTROL XL) 10 MG 24 hr tablet, Hemoglobin A1c  Encounter for Medicare annual wellness exam  Hyperkalemia  Chronic renal disease, stage IV ([place]) - Plan: Basic metabolic panel  Hypothyroidism due to acquired atrophy of thyroid - Plan: levothyroxine (SYNTHROID, LEVOTHROID) 50 MCG tablet, TSH  Dyslipidemia - Plan: atorvastatin (LIPITOR) 40 MG tablet, Lipid panel  Here today to establish care with me and go over her health maint Await labs as above- will monitor her A1c, TSH and lipids Also follow-up on her K today  Results for orders placed or performed in visit on 40/98/11  Basic metabolic panel  Result Value Ref Range   Sodium 139 135 - 145 mEq/L   Potassium 4.7 3.5  - 5.1 mEq/L   Chloride 104 96 - 112 mEq/L   CO2 28 19 - 32 mEq/L   Glucose, Bld 110 (H) 70 - 99 mg/dL   BUN 40 (H) 6 - 23 mg/dL   Creatinine, Ser 1.15 0.40 - 1.20 mg/dL   Calcium 8.9 8.4 - 10.5 mg/dL   GFR 48.27 (L) >60.00 mL/min  Lipid panel  Result Value Ref Range   Cholesterol 109 0 - 200 mg/dL   Triglycerides 168.0 (H) 0.0 - 149.0 mg/dL   HDL 35.00 (L) >39.00 mg/dL   VLDL 33.6 0.0 - 40.0 mg/dL   LDL Cholesterol 40 0 - 99 mg/dL   Total CHOL/HDL Ratio 3    NonHDL 73.95   TSH  Result Value Ref Range   TSH 0.10 (L) 0.35 - 4.50 uIU/mL  Hemoglobin A1c  Result Value Ref Range   Hgb A1c MFr Bld 6.9 (H) 4.6 - 6.5 %   Signed Lamar Blinks, MD  12/14 Called re: her TSH. Spoke with her son [name] We need to lower her thyroid dose.  Will send in rx for 25 mcg- please change to this and have TSH drawn in one month either at the office or per home health K is back in normal range

## 2016-03-02 ENCOUNTER — Encounter: Payer: Self-pay | Admitting: Family Medicine

## 2016-03-02 MED ORDER — LEVOTHYROXINE SODIUM 25 MCG PO TABS: ORAL_TABLET | ORAL | 5 refills | Status: DC

## 2016-03-02 NOTE — Addendum Note (Signed)
Addended by: Lamar Blinks C on: 03/02/2016 06:45 PM   Modules accepted: Orders

## 2016-03-06 ENCOUNTER — Encounter: Payer: Self-pay | Admitting: Family Medicine

## 2016-03-06 NOTE — Progress Notes (Signed)
[facility] 02/21/2016 TL/CMA

## 2016-03-15 ENCOUNTER — Telehealth: Payer: Self-pay | Admitting: Family Medicine

## 2016-03-15 NOTE — Telephone Encounter (Signed)
Caller name: Relationship to patient: Self Can be reached: [phone]  Pharmacy:  [place], [place] [phone] (Phone) [phone] (Fax)     Reason for call: Pharmacy did not receive Rx for Synthroid on 12/14 Also need Rx for 5 mg Glipizide.

## 2016-03-16 ENCOUNTER — Other Ambulatory Visit: Payer: Self-pay | Admitting: Emergency Medicine

## 2016-03-16 DIAGNOSIS — E1142 Type 2 diabetes mellitus with diabetic polyneuropathy: Secondary | ICD-10-CM

## 2016-03-16 DIAGNOSIS — E034 Atrophy of thyroid (acquired): Secondary | ICD-10-CM

## 2016-03-16 MED ORDER — GLIPIZIDE ER 10 MG PO TB24: ORAL_TABLET | ORAL | 3 refills | Status: DC

## 2016-03-16 MED ORDER — LEVOTHYROXINE SODIUM 25 MCG PO TABS: ORAL_TABLET | ORAL | 5 refills | Status: DC

## 2016-03-16 NOTE — Telephone Encounter (Signed)
Resent refills to the pharmacy.

## 2016-03-29 ENCOUNTER — Telehealth: Payer: Self-pay | Admitting: Family Medicine

## 2016-03-29 NOTE — Telephone Encounter (Signed)
[facility] [phone] - Pharmacy    Would like a call back to discuss pt's medication

## 2016-04-12 ENCOUNTER — Telehealth: Payer: Self-pay | Admitting: Family Medicine

## 2016-04-12 ENCOUNTER — Ambulatory Visit (INDEPENDENT_AMBULATORY_CARE_PROVIDER_SITE_OTHER): Payer: PPO | Admitting: Family Medicine

## 2016-04-12 ENCOUNTER — Encounter: Payer: Self-pay | Admitting: Family Medicine

## 2016-04-12 ENCOUNTER — Ambulatory Visit (HOSPITAL_BASED_OUTPATIENT_CLINIC_OR_DEPARTMENT_OTHER)
Admission: RE | Admit: 2016-04-12 | Discharge: 2016-04-12 | Disposition: A | Discharge status: Home / Self Care | Payer: PPO | Source: Ambulatory Visit | Attending: Family Medicine | Admitting: Family Medicine

## 2016-04-12 VITALS — BP 133/67 | HR 69 | Temp 98.1°F | Ht 61.0 in | Wt 109.5 lb

## 2016-04-12 DIAGNOSIS — Z953 Presence of xenogenic heart valve: Secondary | ICD-10-CM | POA: Insufficient documentation

## 2016-04-12 DIAGNOSIS — R062 Wheezing: Secondary | ICD-10-CM | POA: Diagnosis not present

## 2016-04-12 DIAGNOSIS — J441 Chronic obstructive pulmonary disease with (acute) exacerbation: Secondary | ICD-10-CM | POA: Diagnosis not present

## 2016-04-12 DIAGNOSIS — E039 Hypothyroidism, unspecified: Secondary | ICD-10-CM

## 2016-04-12 DIAGNOSIS — R05 Cough: Secondary | ICD-10-CM | POA: Diagnosis not present

## 2016-04-12 DIAGNOSIS — K449 Diaphragmatic hernia without obstruction or gangrene: Secondary | ICD-10-CM | POA: Insufficient documentation

## 2016-04-12 DIAGNOSIS — Z951 Presence of aortocoronary bypass graft: Secondary | ICD-10-CM | POA: Diagnosis not present

## 2016-04-12 DIAGNOSIS — I10 Essential (primary) hypertension: Secondary | ICD-10-CM

## 2016-04-12 DIAGNOSIS — E1142 Type 2 diabetes mellitus with diabetic polyneuropathy: Secondary | ICD-10-CM

## 2016-04-12 DIAGNOSIS — Z950 Presence of cardiac pacemaker: Secondary | ICD-10-CM | POA: Diagnosis not present

## 2016-04-12 DIAGNOSIS — I700 Atherosclerosis of aorta: Secondary | ICD-10-CM | POA: Insufficient documentation

## 2016-04-12 DIAGNOSIS — M5134 Other intervertebral disc degeneration, thoracic region: Secondary | ICD-10-CM | POA: Diagnosis not present

## 2016-04-12 MED ORDER — DOXYCYCLINE HYCLATE 100 MG PO CAPS: 100.0000 mg | ORAL_CAPSULE | Freq: Two times a day (BID) | ORAL | 0 refills | Status: DC

## 2016-04-12 MED ORDER — PREDNISONE 20 MG PO TABS: ORAL_TABLET | ORAL | 0 refills | Status: DC

## 2016-04-12 MED ORDER — IPRATROPIUM-ALBUTEROL 0.5-2.5 (3) MG/3ML IN SOLN: 3.0000 mL | RESPIRATORY_TRACT | 1 refills | Status: DC | PRN

## 2016-04-12 MED ORDER — IPRATROPIUM-ALBUTEROL 0.5-2.5 (3) MG/3ML IN SOLN
3.0000 mL | Freq: Once | RESPIRATORY_TRACT | Status: AC
  Administered 2016-04-12: 3 mL via RESPIRATORY_TRACT

## 2016-04-12 MED ORDER — CARVEDILOL 25 MG PO TABS: ORAL_TABLET | ORAL | 3 refills | Status: DC

## 2016-04-12 NOTE — Patient Instructions (Addendum)
Please go by [facility] and pick up a nebulizer machine today!  Address: [address] [place], [place] [address]      Phone: [phone]  After your lab draw, please go to the ground floor for your chest x-ray We will treat you for a COPD exacerbation with prednisone, doxycycline (antibitoic) and the nebulizer treatments as needed Use the neb OR the albuterol inhaler- not both  If Barbara Garner is not getting better or if she is getting worse please bring her back to clinic or to the ER

## 2016-04-12 NOTE — Progress Notes (Signed)
[place] at [facility] [address] [place], [place] [address] [phone]  Date:  04/12/2016   Name:  Barbara Garner   DOB:  08/15/1934   MRN:  [id number]  PCP:  Barbara Blinks, MD    Chief Complaint: COPD (c/o labored breathing and coughing that started yesterday. recently dx with COPD.)   History of Present Illness:  Barbara Garner is a 82 y.o. very pleasant female patient who presents with the following:  Here today with concern of a COPD exacerbation accompanied by her DIL Barbara Garner She has a pacemaker for complete heart block Her DIL noted that she develoepd a mild cough over the last couple of days- however yesterday she "was really wheezing."   They have not noted any fever They do not think she is coughing anything up They have not noted any rash, diarrhea or any other particular symptoms She does not speak much so other history if uncertain  They have used their albuterol inhaler at home that seems to help a bit- she needs to have help using this.  The person who stays with her is not always that detailed in his reports so DIL is not complelty sure what she has been treated with recently  We also need to repeat her TSH today following a levothyroixine dose change last month Her DIL does not think that her behavior or mental status is any different from her baseline   Asked Lauralee how she was doing;" I'm alive" She denies any pain  Most recently seen in the ER with a COPD exacerbation in September- she was treated in the ER with a duoneb and released to home with zithromax and albuterol  She does have DM but it is under ok control with just glipizide 30m a day   Lab Results  Component Value Date   TSH 0.10 (L) 03/01/2016   Lab Results  Component Value Date   HGBA1C 6.9 (H) 03/01/2016    Patient Active Problem List   Diagnosis Date Noted  . Thyroid activity decreased 05/14/2015  . Postmenopausal estrogen  deficiency 03/10/2015  . Pain in lower limb 03/08/2015  . Left knee pain 08/25/2014  . Hyperkalemia 05/25/2014  . CN (constipation) 05/08/2014  . Diabetes mellitus type 2, controlled ([place] 03/11/2014  . Dementia 12/17/2013  . Memory loss, short term 10/29/2013  . Low back pain 09/24/2013  . Sinus bradycardia 09/24/2012  . Vitamin D deficiency disease 08/23/2012  . Lichen sclerosus et atrophicus of the vulva 04/05/2012  . Chronic diastolic CHF (congestive heart failure) ([place] 11/15/2011  . Pacemaker-Medtronic 09/13/2011  . Atrial fibrillation ([facility] 09/12/2011  . Complete heart block-intermittent 09/09/2011  . S/P aortic valve replacement 09/06/2011  . S/P CABG x 3 09/06/2011  . IVCD (intraventricular conduction defect)   . CKD (chronic kidney disease) stage 4, GFR 15-29 ml/min (HCC)   . Carotid stenosis 05/30/2011  . Squamous cell carcinoma of skin 07/21/2010  . MEMORY LOSS 08/19/2009  . HYPERCHOLESTEROLEMIA, PURE 09/20/2006  . ANEMIA-IRON DEFICIENCY 06/27/2006  . PERIPHERAL NEUROPATHY 06/27/2006  . Essential hypertension 06/27/2006  . MYOCARDIAL INFARCTION, HX OF 06/27/2006  . CORONARY ARTERY DISEASE 06/27/2006  . ASTHMA 06/27/2006  . PNEUMONIA, HX OF 06/27/2006  . ECZEMA 06/27/2006    Past Medical History:  Diagnosis Date  . Atrioventricular block, complete -intermittent   . CAD  S/P CABG x 3 08/2011    LIMA to diagonal branch, SVG to OM1, SVG to  PDA, EVH via bilateral thighs  . Carotid artery disease ([place])    a. Carotid US 0/93 with 23% LICA stenosis;  b. Carotid US 11/16:  Bilateral ICA 40-59% >> FU 1 year // c. Carotid US 11/17: R 40-59; L 1-39 >> follow-up 1 year  . Chronic diastolic CHF (congestive heart failure) ([place])   . CKD (chronic kidney disease) stage 4, GFR 15-29 ml/min (HCC)    Cr 1.6 in 6/11, sees Dr. Arty Baumgartner  . Contact dermatitis and other eczema, due to unspecified cause   . Esophageal reflux   . Extrinsic asthma, unspecified    hasn't used inhaler in  past year  . Headache(784.0)   . hypertension   . Hypothyroidism   . Idiopathic peripheral neuropathy   . Impaired vision    pt. reports that she identifies her meds by looking at the pillls, she is not able to read labels on bottles  . Iron deficiency anemia, unspecified   . LBBB (left bundle branch block)   . Memory loss   . Pacemaker MDT dual chamber    DOI 08/2011  . Pure hypercholesterolemia   . Severe aortic stenosis -S/P AVR     19 mm [facility] Ease pericardial tissue valve  . Type II or unspecified type diabetes mellitus without mention of complication, not stated as uncontrolled     Past Surgical History:  Procedure Laterality Date  . ABDOMINAL HYSTERECTOMY  1977   partial; fibroids  . AORTIC VALVE REPLACEMENT  09/06/2011   Procedure: AORTIC VALVE REPLACEMENT (AVR);  Surgeon: Rexene Alberts, MD;  Location: [place];  Service: Open Heart Surgery;  Laterality: N/A;  . bladder tack  1977  . CARDIAC CATHETERIZATION    . CARDIOVASCULAR STRESS TEST  10/2002   normal (per patient)  . carotid dopplers  2006  . CATARACT EXTRACTION, BILATERAL  2003  . COLONOSCOPY  04/1999   1 polyp  . COLONOSCOPY  06/2004   Neg. Int hem  . COLONOSCOPY  08/2013  . CORONARY ARTERY BYPASS GRAFT  09/06/2011   Procedure: CORONARY ARTERY BYPASS GRAFTING (CABG);  Surgeon: Rexene Alberts, MD;  Location: [place];  Service: Open Heart Surgery;  Laterality: N/A;  . DEXA  10/2003 ,09/2005   osteoporosis,  Osteopenia  . ESOPHAGEAL DILATION  1997  . ESOPHAGOGASTRODUODENOSCOPY  04/1999   HH; "watermelon stomach" (severe gastritis)  . ESOPHAGOGASTRODUODENOSCOPY  04/2002   Gastritis  . LEFT AND RIGHT HEART CATHETERIZATION WITH CORONARY ANGIOGRAM N/A 08/11/2011   Procedure: LEFT AND RIGHT HEART CATHETERIZATION WITH CORONARY ANGIOGRAM;  Surgeon: Larey Dresser, MD;  Location: [facility] CATH LAB;  Service: Cardiovascular;  Laterality: N/A;  . Opthy  11/1998;12/01;11/02  . PERMANENT PACEMAKER INSERTION N/A 2020-03-2911    Procedure: PERMANENT PACEMAKER INSERTION;  Surgeon: Deboraha Sprang, MD;  Location: [facility] CATH LAB;  Service: Cardiovascular;  Laterality: N/A;  . PTCA  [id number]   5 blockages  . REFRACTIVE SURGERY  2006    Social History  Substance Use Topics  . Smoking status: Never Smoker  . Smokeless tobacco: Never Used  . Alcohol use No    Family History  Problem Relation Age of Onset  . Stroke Mother     died in her 13's  . Stroke Father     died in his 21's  . Diabetes Father   . Prostate cancer Son   . Diabetes Son     #2  . Hypertension Son     #3  . Thyroid disease  Neg Hx   . Heart attack Neg Hx     Allergies  Allergen Reactions  . Diflucan [Fluconazole] Rash    Renal Failure  . Keflex [Cephalexin]     rash  . Metformin     REACTION: intolerant  . Promethazine Hcl     REACTION: u/k    Medication list has been reviewed and updated.  Current Outpatient Prescriptions on File Prior to Visit  Medication Sig Dispense Refill  . albuterol (PROVENTIL HFA;VENTOLIN HFA) 108 (90 BASE) MCG/ACT inhaler Inhale 2 puffs into the lungs every 6 (six) hours as needed for wheezing or shortness of breath. 1 Inhaler 0  . aspirin 81 MG EC tablet Take 1 tablet (81 mg total) by mouth daily. 30 tablet 3  . atorvastatin (LIPITOR) 40 MG tablet Take 1 tablet (40 mg total) by mouth daily. 90 tablet 3  . Blood Glucose Monitoring Suppl (ONE TOUCH ULTRA MINI) W/DEVICE KIT USE AS DIRECTED TO TEST BLOOD GLUCOSE ONCE DAILY DX: 250.00 1 each 0  . carvedilol (COREG) 25 MG tablet TAKE ONE-HALF TABLET BY MOUTH TWICE DAILY WITH A MEAL 90 tablet 0  . Cholecalciferol (VITAMIN D-3) 1000 UNITS CAPS Take 1 capsule by mouth daily.    Marland Kitchen glucose blood test strip Use as instructed to test blood glucose once daily Dx: E11.9 100 each 12  . levothyroxine (SYNTHROID, LEVOTHROID) 25 MCG tablet TAKE ONE TABLET BY MOUTH ONCE DAILY BEFORE BREAKFAST 30 tablet 5  . polyethylene glycol powder (GLYCOLAX/MIRALAX) powder MIX ONE CAPFUL  IN LIQUID AND DRINK ONCE DAILY 765 g 3   No current facility-administered medications on file prior to visit.     Review of Systems:  As per HPI- otherwise negative.   Physical Examination: Vitals:   04/12/16 1405  BP: 133/67  Pulse: 69  Temp: 98.1 F (36.7 C)   Vitals:   04/12/16 1405  Weight: 109 lb 8 oz (49.7 kg)  Height: _0  (1.549 m)   Body mass index is 20.69 kg/m. Ideal Body Weight: Weight in (lb) to have BMI = 25: 132  GEN: WDWN, NAD, Non-toxic, A & O x 3, appears older than stated age.  Sitting in chair in no distress but audibly wheezing.   HEENT: Atraumatic, Normocephalic. Neck supple. No masses, No LAD.  Bilateral TM wnl, oropharynx normal.  PEERL,EOMI.   Ears and Nose: No external deformity. CV: RRR, No M/G/R. No JVD. No thrill. No extra heart sounds. PULM: diffuse bilateral wheezes, no crackles noted. No retractions. No resp. distress. No accessory muscle use. ABD: S, NT, ND EXTR: No c/c/e NEURO Normal gait.  PSYCH: Normally interactive. Conversant. Not depressed or anxious appearing.  Calm demeanor.   Duoneb: she was moving better air, less wheezing  Assessment and Plan: Controlled type 2 diabetes mellitus with diabetic polyneuropathy, without long-term current use of insulin (HCC) - Plan: GLIPIZIDE XL 5 MG 24 hr tablet  Wheezing - Plan: ipratropium-albuterol (DUONEB) 0.5-2.5 (3) MG/3ML nebulizer solution 3 mL, DG Chest 2 View  COPD exacerbation (HCC) - Plan: predniSONE (DELTASONE) 20 MG tablet, doxycycline (VIBRAMYCIN) 100 MG capsule, Basic metabolic panel, ipratropium-albuterol (DUONEB) 0.5-2.5 (3) MG/3ML SOLN, DG Chest 2 View  Acquired hypothyroidism - Plan: TSH  Essential hypertension - Plan: carvedilol (COREG) 25 MG tablet   Here today with a COPD exacerbation Will obtain CXR Plan to treat with doxycycline, a short steroid taper and duoneb for home (they will get a neb machine) Check TSH today as well as BMP to check her  lytes and  glucose Refilled her coreg today If she is not responding or is getting worse they will take her to the ER  Meds ordered this encounter  Medications  . GLIPIZIDE XL 5 MG 24 hr tablet    Sig: Take 5 mg by mouth daily.  . carvedilol (COREG) 25 MG tablet    Sig: TAKE ONE-HALF TABLET BY MOUTH TWICE DAILY WITH A MEAL    Dispense:  90 tablet    Refill:  3    D/C PREVIOUS SCRIPTS FOR THIS MEDICATION  . ipratropium-albuterol (DUONEB) 0.5-2.5 (3) MG/3ML nebulizer solution 3 mL  . predniSONE (DELTASONE) 20 MG tablet    Sig: Take 2 pills a day for 3 days, then 1 pill a day for 3 days    Dispense:  9 tablet    Refill:  0  . doxycycline (VIBRAMYCIN) 100 MG capsule    Sig: Take 1 capsule (100 mg total) by mouth 2 (two) times daily.    Dispense:  20 capsule    Refill:  0  . ipratropium-albuterol (DUONEB) 0.5-2.5 (3) MG/3ML SOLN    Sig: Take 3 mLs by nebulization every 4 (four) hours as needed. Use every 4-6 hours as needed for cough and wheezing    Dispense:  360 mL    Refill:  1     Dg Chest 2 View  Result Date: 04/12/2016 CLINICAL DATA:  Cough, wheezing. EXAM: CHEST  2 VIEW COMPARISON:  Radiographs of February 16, 2016. FINDINGS: Stable cardiomediastinal silhouette. Status post coronary artery bypass graft. Atherosclerosis of thoracic aorta is noted. Left-sided pacemaker is unchanged in position. No pneumothorax or pleural effusion is noted. No acute pulmonary disease is noted. Status post aortic valve replacement. Left lung is clear. Stable right basilar scarring is noted. Stable moderate size hiatal hernia. Multilevel degenerative disc disease is noted in the mid thoracic spine. IMPRESSION: Aortic atherosclerosis. Stable moderate size hiatal hernia. Stable right basilar scarring. No acute cardiopulmonary abnormality seen. Electronically Signed   By: Marijo Conception, M.D.   On: 04/12/2016 15:48     Signed Barbara Blinks, MD

## 2016-04-12 NOTE — Telephone Encounter (Signed)
Pt's daughter called in to schedule an appt for pt. She says that pt has COPD and is having difficulty breathing. I transferred call to team health for further triaging

## 2016-04-12 NOTE — Progress Notes (Signed)
Pre visit review using our clinic review tool, if applicable. No additional management support is needed unless otherwise documented below in the visit note. 

## 2016-04-12 NOTE — Telephone Encounter (Signed)
Message routed to PCP and CMA for  FYI.

## 2016-04-12 NOTE — Telephone Encounter (Signed)
Patient Name: Barbara Garner  DOB: 18-Aug-1934    Initial Comment Caller states mother in law is having difficulty breathing and has COPD   Nurse Assessment  Nurse: Julien Girt, RN, Almyra Free Date/Time Eilene Ghazi Time): 04/12/2016 9:32:39 AM  Confirm and document reason for call. If symptomatic, describe symptoms. ---Caller states her mother in law is having increased difficulty breathing, wheezing and her breathing is labored. She developed a cough 2 days ago and her sx have worsened. She has COPD. She is using her inhaler, is able to speak in full sentences, is able to swallow. Caller states every time she gets a cough she has an exacerbation. Denies respiratory distress.  Does the patient have any new or worsening symptoms? ---Yes  Will a triage be completed? ---Yes  Related visit to physician within the last 2 weeks? ---No  Does the PT have any chronic conditions? (i.e. diabetes, asthma, etc.) ---Yes  List chronic conditions. ---COPD/Asthma, Htn, Diabetes, Kidney Disease  Is this a behavioral health or substance abuse call? ---No     Guidelines    Guideline Title Affirmed Question Affirmed Notes  COPD Oxygen Monitoring and Hypoxia [1] MODERATE difficulty breathing (e.g., speaks in phrases, SOB even at rest) AND [2] worse than normal    Final Disposition User   Go to ED Now (or PCP triage) Julien Girt, RN, Almyra Free    Comments  Caller does not want to bring her m-i-l to the ED, she would prefer to see Dr. Edilia Bo, Again states she is not in distress, just increased wheezing.  Spoke to [place], advised of appointment time. States she will proceed to the ED if her sx worsen prior to 1:45 and cb as needed.   Referrals  REFERRED TO PCP OFFICE   Disagree/Comply: Disagree

## 2016-04-13 LAB — TSH: TSH: 4.36 u[IU]/mL (ref 0.35–4.50)

## 2016-04-13 LAB — BASIC METABOLIC PANEL
BUN: 38 mg/dL — ABNORMAL HIGH (ref 6–23)
CO2: 22 mEq/L (ref 19–32)
Calcium: 8.7 mg/dL (ref 8.4–10.5)
Chloride: 105 mEq/L (ref 96–112)
Creatinine, Ser: 1.41 mg/dL — ABNORMAL HIGH (ref 0.40–1.20)
GFR: 38.14 mL/min — AB (ref >60.00)
Glucose, Bld: 157 mg/dL — ABNORMAL HIGH (ref 70–99)
POTASSIUM: 4.8 meq/L (ref 3.5–5.1)
SODIUM: 136 meq/L (ref 135–145)

## 2016-06-14 ENCOUNTER — Ambulatory Visit: Payer: Medicare Other | Admitting: Family Medicine

## 2016-11-20 NOTE — Progress Notes (Deleted)
[place] at [facility] [address] [place], [place] [address] [phone]  Date:  11/22/2016   Name:  Barbara Garner   DOB:  [birth year]   MRN:  [id number]  PCP:  Darreld Mclean, MD    Chief Complaint: No chief complaint on file.   History of Present Illness:  Barbara Garner is a 82 y.o. very pleasant female patient who presents with the following:  Last seen by myself in January for a COPD exacerbation Pear suffers from dementia, complete heart block with pacer, controlled DM, CAD, chronic renal insuf and COPD She is overdue for several services today Flu shot: prevnar is due A1c is due, TSH also  Lab Results  Component Value Date   TSH 4.36 04/12/2016     Lab Results  Component Value Date   HGBA1C 6.9 (H) 03/01/2016     Patient Active Problem List   Diagnosis Date Noted  . Thyroid activity decreased 05/14/2015  . Postmenopausal estrogen deficiency 03/10/2015  . Pain in lower limb 03/08/2015  . Left knee pain 08/25/2014  . Hyperkalemia 05/25/2014  . CN (constipation) 05/08/2014  . Diabetes mellitus type 2, controlled ([place]) 03/11/2014  . Dementia 12/17/2013  . Memory loss, short term 10/29/2013  . Low back pain 09/24/2013  . Sinus bradycardia 09/24/2012  . Vitamin D deficiency disease 08/23/2012  . Lichen sclerosus et atrophicus of the vulva 04/05/2012  . Chronic diastolic CHF (congestive heart failure) ([place]) 11/15/2011  . Pacemaker-Medtronic 09/13/2011  . Atrial fibrillation ([place]) 09/12/2011  . Complete heart block-intermittent 09/09/2011  . S/P aortic valve replacement 09/06/2011  . S/P CABG x 3 09/06/2011  . IVCD (intraventricular conduction defect)   . CKD (chronic kidney disease) stage 4, GFR 15-29 ml/min (HCC)   . Carotid stenosis 05/30/2011  . Squamous cell carcinoma of skin 07/21/2010  . MEMORY LOSS 08/19/2009  . HYPERCHOLESTEROLEMIA, PURE 09/20/2006  . ANEMIA-IRON DEFICIENCY 06/27/2006  .  PERIPHERAL NEUROPATHY 06/27/2006  . Essential hypertension 06/27/2006  . MYOCARDIAL INFARCTION, HX OF 06/27/2006  . CORONARY ARTERY DISEASE 06/27/2006  . ASTHMA 06/27/2006  . PNEUMONIA, HX OF 06/27/2006  . ECZEMA 06/27/2006    Past Medical History:  Diagnosis Date  . Atrioventricular block, complete -intermittent   . CAD  S/P CABG x 3 08/2011    LIMA to diagonal branch, SVG to OM1, SVG to PDA, EVH via bilateral thighs  . Carotid artery disease ([place])    a. Carotid US 0/21 with 11% LICA stenosis;  b. Carotid US 11/16:  Bilateral ICA 40-59% >> FU 1 year // c. Carotid US 11/17: R 40-59; L 1-39 >> follow-up 1 year  . Chronic diastolic CHF (congestive heart failure) ([place])   . CKD (chronic kidney disease) stage 4, GFR 15-29 ml/min (HCC)    Cr 1.6 in 6/11, sees Dr. Arty Baumgartner  . Contact dermatitis and other eczema, due to unspecified cause   . Esophageal reflux   . Extrinsic asthma, unspecified    hasn't used inhaler in past year  . Headache(784.0)   . hypertension   . Hypothyroidism   . Idiopathic peripheral neuropathy   . Impaired vision    pt. reports that she identifies her meds by looking at the pillls, she is not able to read labels on bottles  . Iron deficiency anemia, unspecified   . LBBB (left bundle branch block)   . Memory loss   . Pacemaker MDT dual chamber  DOI 08/2011  . Pure hypercholesterolemia   . Severe aortic stenosis -S/P AVR     19 mm [facility] Ease pericardial tissue valve  . Type II or unspecified type diabetes mellitus without mention of complication, not stated as uncontrolled     Past Surgical History:  Procedure Laterality Date  . ABDOMINAL HYSTERECTOMY  1977   partial; fibroids  . AORTIC VALVE REPLACEMENT  09/06/2011   Procedure: AORTIC VALVE REPLACEMENT (AVR);  Surgeon: Rexene Alberts, MD;  Location: [place];  Service: Open Heart Surgery;  Laterality: N/A;  . bladder tack  1977  . CARDIAC CATHETERIZATION    . CARDIOVASCULAR STRESS TEST  10/2002    normal (per patient)  . carotid dopplers  2006  . CATARACT EXTRACTION, BILATERAL  2003  . COLONOSCOPY  04/1999   1 polyp  . COLONOSCOPY  06/2004   Neg. Int hem  . COLONOSCOPY  08/2013  . CORONARY ARTERY BYPASS GRAFT  09/06/2011   Procedure: CORONARY ARTERY BYPASS GRAFTING (CABG);  Surgeon: Rexene Alberts, MD;  Location: [place];  Service: Open Heart Surgery;  Laterality: N/A;  . DEXA  10/2003 ,09/2005   osteoporosis,  Osteopenia  . ESOPHAGEAL DILATION  1997  . ESOPHAGOGASTRODUODENOSCOPY  04/1999   HH; "watermelon stomach" (severe gastritis)  . ESOPHAGOGASTRODUODENOSCOPY  04/2002   Gastritis  . LEFT AND RIGHT HEART CATHETERIZATION WITH CORONARY ANGIOGRAM N/A 08/11/2011   Procedure: LEFT AND RIGHT HEART CATHETERIZATION WITH CORONARY ANGIOGRAM;  Surgeon: Larey Dresser, MD;  Location: [facility] CATH LAB;  Service: Cardiovascular;  Laterality: N/A;  . Opthy  11/1998;12/01;11/02  . PERMANENT PACEMAKER INSERTION N/A 01-20-2012   Procedure: PERMANENT PACEMAKER INSERTION;  Surgeon: Deboraha Sprang, MD;  Location: [facility] CATH LAB;  Service: Cardiovascular;  Laterality: N/A;  . PTCA  [id number]   5 blockages  . REFRACTIVE SURGERY  2006    Social History  Substance Use Topics  . Smoking status: Never Smoker  . Smokeless tobacco: Never Used  . Alcohol use No    Family History  Problem Relation Age of Onset  . Stroke Mother        died in her 21's  . Stroke Father        died in his 48's  . Diabetes Father   . Prostate cancer Son   . Diabetes Son        #2  . Hypertension Son        #3  . Thyroid disease Neg Hx   . Heart attack Neg Hx     Allergies  Allergen Reactions  . Diflucan [Fluconazole] Rash    Renal Failure  . Keflex [Cephalexin]     rash  . Metformin     REACTION: intolerant  . Promethazine Hcl     REACTION: u/k    Medication list has been reviewed and updated.  Current Outpatient Prescriptions on File Prior to Visit  Medication Sig Dispense Refill  . albuterol (PROVENTIL  HFA;VENTOLIN HFA) 108 (90 BASE) MCG/ACT inhaler Inhale 2 puffs into the lungs every 6 (six) hours as needed for wheezing or shortness of breath. 1 Inhaler 0  . aspirin 81 MG EC tablet Take 1 tablet (81 mg total) by mouth daily. 30 tablet 3  . atorvastatin (LIPITOR) 40 MG tablet Take 1 tablet (40 mg total) by mouth daily. 90 tablet 3  . Blood Glucose Monitoring Suppl (ONE TOUCH ULTRA MINI) W/DEVICE KIT USE AS DIRECTED TO TEST BLOOD GLUCOSE ONCE DAILY DX: 250.00 1 each  0  . carvedilol (COREG) 25 MG tablet TAKE ONE-HALF TABLET BY MOUTH TWICE DAILY WITH A MEAL 90 tablet 3  . Cholecalciferol (VITAMIN D-3) 1000 UNITS CAPS Take 1 capsule by mouth daily.    Marland Kitchen doxycycline (VIBRAMYCIN) 100 MG capsule Take 1 capsule (100 mg total) by mouth 2 (two) times daily. 20 capsule 0  . GLIPIZIDE XL 5 MG 24 hr tablet Take 5 mg by mouth daily.    Marland Kitchen glucose blood test strip Use as instructed to test blood glucose once daily Dx: E11.9 100 each 12  . ipratropium-albuterol (DUONEB) 0.5-2.5 (3) MG/3ML SOLN Take 3 mLs by nebulization every 4 (four) hours as needed. Use every 4-6 hours as needed for cough and wheezing 360 mL 1  . levothyroxine (SYNTHROID, LEVOTHROID) 25 MCG tablet TAKE ONE TABLET BY MOUTH ONCE DAILY BEFORE BREAKFAST 30 tablet 5  . polyethylene glycol powder (GLYCOLAX/MIRALAX) powder MIX ONE CAPFUL IN LIQUID AND DRINK ONCE DAILY 765 g 3  . predniSONE (DELTASONE) 20 MG tablet Take 2 pills a day for 3 days, then 1 pill a day for 3 days 9 tablet 0   No current facility-administered medications on file prior to visit.     Review of Systems:  As per HPI- otherwise negative.   Physical Examination: There were no vitals filed for this visit. There were no vitals filed for this visit. There is no height or weight on file to calculate BMI. Ideal Body Weight:    GEN: WDWN, NAD, Non-toxic, A & O x 3 HEENT: Atraumatic, Normocephalic. Neck supple. No masses, No LAD. Ears and Nose: No external deformity. CV: RRR,  No M/G/R. No JVD. No thrill. No extra heart sounds. PULM: CTA B, no wheezes, crackles, rhonchi. No retractions. No resp. distress. No accessory muscle use. ABD: S, NT, ND, +BS. No rebound. No HSM. EXTR: No c/c/e NEURO Normal gait.  PSYCH: Normally interactive. Conversant. Not depressed or anxious appearing.  Calm demeanor.    Assessment and Plan: ***  Signed Lamar Blinks, MD

## 2016-11-22 ENCOUNTER — Ambulatory Visit: Payer: PPO | Admitting: Family Medicine

## 2016-11-22 DIAGNOSIS — Z0289 Encounter for other administrative examinations: Secondary | ICD-10-CM

## 2016-12-20 DIAGNOSIS — Z23 Encounter for immunization: Secondary | ICD-10-CM | POA: Diagnosis not present

## 2016-12-20 DIAGNOSIS — R801 Persistent proteinuria, unspecified: Secondary | ICD-10-CM | POA: Diagnosis not present

## 2016-12-20 DIAGNOSIS — I10 Essential (primary) hypertension: Secondary | ICD-10-CM | POA: Diagnosis not present

## 2016-12-20 DIAGNOSIS — E039 Hypothyroidism, unspecified: Secondary | ICD-10-CM | POA: Diagnosis not present

## 2016-12-20 DIAGNOSIS — I5032 Chronic diastolic (congestive) heart failure: Secondary | ICD-10-CM | POA: Diagnosis not present

## 2016-12-20 DIAGNOSIS — N179 Acute kidney failure, unspecified: Secondary | ICD-10-CM | POA: Diagnosis not present

## 2016-12-20 DIAGNOSIS — N393 Stress incontinence (female) (male): Secondary | ICD-10-CM | POA: Diagnosis not present

## 2016-12-20 DIAGNOSIS — E119 Type 2 diabetes mellitus without complications: Secondary | ICD-10-CM | POA: Diagnosis not present

## 2016-12-20 DIAGNOSIS — N2581 Secondary hyperparathyroidism of renal origin: Secondary | ICD-10-CM | POA: Diagnosis not present

## 2016-12-20 DIAGNOSIS — E611 Iron deficiency: Secondary | ICD-10-CM | POA: Diagnosis not present

## 2016-12-20 DIAGNOSIS — N183 Chronic kidney disease, stage 3 (moderate): Secondary | ICD-10-CM | POA: Diagnosis not present

## 2016-12-20 DIAGNOSIS — I499 Cardiac arrhythmia, unspecified: Secondary | ICD-10-CM | POA: Diagnosis not present

## 2016-12-20 DIAGNOSIS — E7800 Pure hypercholesterolemia, unspecified: Secondary | ICD-10-CM | POA: Diagnosis not present

## 2017-01-11 ENCOUNTER — Other Ambulatory Visit: Payer: Self-pay | Admitting: *Deleted

## 2017-01-11 DIAGNOSIS — I6523 Occlusion and stenosis of bilateral carotid arteries: Secondary | ICD-10-CM

## 2017-03-08 ENCOUNTER — Other Ambulatory Visit: Payer: Self-pay | Admitting: Family Medicine

## 2017-03-08 DIAGNOSIS — E785 Hyperlipidemia, unspecified: Secondary | ICD-10-CM

## 2017-03-08 DIAGNOSIS — E034 Atrophy of thyroid (acquired): Secondary | ICD-10-CM

## 2017-03-08 DIAGNOSIS — E1142 Type 2 diabetes mellitus with diabetic polyneuropathy: Secondary | ICD-10-CM

## 2017-03-29 NOTE — Progress Notes (Signed)
Subjective:   Barbara Garner is a 83 y.o. female who presents for Medicare Annual (Subsequent) preventive examination.  Review of Systems:  No ROS.  Medicare Wellness Visit. Additional risk factors are reflected in the social history. Cardiac Risk Factors include: advanced age (>85mn, >>65women);diabetes mellitus;hypertension Sleep patterns: Rests well per pt.  Home Safety/Smoke Alarms: Feels safe in home. Smoke alarms in place.  Lives with son [name]. Stays on basement level. Does not have to use stairs currently.   Female:    Mammo- pt and son decline      Dexa scan- pt and son decline.       CCS- last 09/03/13. Objective:     Vitals: BP 124/62 (BP Location: Left Arm, Patient Position: Sitting, Cuff Size: Normal)   Pulse 86   Ht '5\' 1"'  (1.549 m)   Wt 99 lb (44.9 kg)   SpO2 97%   BMI 18.71 kg/m   Body mass index is 18.71 kg/m.  Advanced Directives 04/02/2017 03/01/2016 11/29/2015 02/24/2015 08/27/2014 03/11/2014 03/10/2014  Does Patient Have a Medical Advance Directive? No Yes No No No Yes No  Type of Advance Directive - - - - - (No Data) -  Does patient want to make changes to medical advance directive? - Yes (MAU/Ambulatory/Procedural Areas - Information given) - - - - -  Would patient like information on creating a medical advance directive? No - Patient declined - No - patient declined information No - patient declined information No - patient declined information No - patient declined information Yes -Higher education careers advisergiven  Pre-existing out of facility DNR order (yellow form or pink MOST form) - - - - - - -    Tobacco Social History   Tobacco Use  Smoking Status Never Smoker  Smokeless Tobacco Never Used     Counseling given: Not Answered   Clinical Intake: Pain : No/denies pain   Past Medical History:  Diagnosis Date  . Atrioventricular block, complete -intermittent   . CAD  S/P CABG x 3 08/2011    LIMA to diagonal branch, SVG to OM1, SVG to  PDA, EVH via bilateral thighs  . Carotid artery disease ([facility]    a. Carotid [place] 444%LICA stenosis;  b. Carotid [place]16:  Bilateral ICA 40-59% >> FU 1 year // c. Carotid [place]17: R 40-59; L 1-39 >> follow-up 1 year  . Chronic diastolic CHF (congestive heart failure) ([facility]   . CKD (chronic kidney disease) stage 4, GFR 15-29 ml/min (HCC)    Cr 1.6 in 6/11, sees Dr. CArty Baumgartner . Contact dermatitis and other eczema, due to unspecified cause   . Esophageal reflux   . Extrinsic asthma, unspecified    hasn't used inhaler in past year  . Headache(784.0)   . hypertension   . Hypothyroidism   . Idiopathic peripheral neuropathy   . Impaired vision    pt. reports that she identifies her meds by looking at the pillls, she is not able to read labels on bottles  . Iron deficiency anemia, unspecified   . LBBB (left bundle branch block)   . Memory loss   . Pacemaker MDT dual chamber    DOI 08/2011  . Pure hypercholesterolemia   . Severe aortic stenosis -S/P AVR     [facility]  . Type II or unspecified type diabetes mellitus without mention of complication, not stated as uncontrolled    Past Surgical  History:  Procedure Laterality Date  . ABDOMINAL HYSTERECTOMY  1977   partial; fibroids  . AORTIC VALVE REPLACEMENT  09/06/2011   Procedure: AORTIC VALVE REPLACEMENT (AVR);  Surgeon: Rexene Alberts, MD;  Location: [place];  Service: Open Heart Surgery;  Laterality: N/A;  . bladder tack  1977  . CARDIAC CATHETERIZATION    . CARDIOVASCULAR STRESS TEST  10/2002   normal (per patient)  . carotid dopplers  2006  . CATARACT EXTRACTION, BILATERAL  2003  . COLONOSCOPY  04/1999   1 polyp  . COLONOSCOPY  06/2004   Neg. Int hem  . COLONOSCOPY  08/2013  . CORONARY ARTERY BYPASS GRAFT  09/06/2011   Procedure: CORONARY ARTERY BYPASS GRAFTING (CABG);  Surgeon: Rexene Alberts, MD;  Location: [place];  Service: Open Heart Surgery;  Laterality: N/A;  . DEXA  10/2003  ,09/2005   osteoporosis,  Osteopenia  . ESOPHAGEAL DILATION  1997  . ESOPHAGOGASTRODUODENOSCOPY  04/1999   HH; "watermelon stomach" (severe gastritis)  . ESOPHAGOGASTRODUODENOSCOPY  04/2002   Gastritis  . LEFT AND RIGHT HEART CATHETERIZATION WITH CORONARY ANGIOGRAM N/A 08/11/2011   Procedure: LEFT AND RIGHT HEART CATHETERIZATION WITH CORONARY ANGIOGRAM;  Surgeon: Larey Dresser, MD;  Location[facility] CATH LAB;  Service: Cardiovascular;  Laterality: N/A;  . Opthy  11/1998;12/01;11/02  . PERMANENT PACEMAKER INSERTION N/A 06/12/2011   Procedure: PERMANENT PACEMAKER INSERTION;  Surgeon: Deboraha Sprang, MD;  Location: [facility] CATH LAB;  Service: Cardiovascular;  Laterality: N/A;  . PTCA  [id number]   5 blockages  . REFRACTIVE SURGERY  2006   Family History  Problem Relation Age of Onset  . Stroke Mother        died in her 62's  . Stroke Father        died in his 52's  . Diabetes Father   . Prostate cancer Son   . Diabetes Son        #2  . Hypertension Son        #3  . Thyroid disease Neg Hx   . Heart attack Neg Hx    Social History   Socioeconomic History  . Marital status: Widowed    Spouse name: None  . Number of children: 3  . Years of education: None  . Highest education level: None  Social Needs  . Financial resource strain: None  . Food insecurity - worry: None  . Food insecurity - inability: None  . Transportation needs - medical: None  . Transportation needs - non-medical: None  Occupational History  . Occupation: Retired    Fish farm manager: RETIRED  Tobacco Use  . Smoking status: Never Smoker  . Smokeless tobacco: Never Used  Substance and Sexual Activity  . Alcohol use: No    Alcohol/week: 0.0 oz  . Drug use: No  . Sexual activity: No  Other Topics Concern  . None  Social History Narrative   Widowed.  Lives with son in [place].          Outpatient Encounter Medications as of 04/02/2017  Medication Sig  . albuterol (PROVENTIL HFA;VENTOLIN HFA) 108 (90 BASE) MCG/ACT  inhaler Inhale 2 puffs into the lungs every 6 (six) hours as needed for wheezing or shortness of breath.  Marland Kitchen aspirin 81 MG EC tablet Take 1 tablet (81 mg total) by mouth daily.  Marland Kitchen atorvastatin (LIPITOR) 40 MG tablet TAKE ONE TABLET BY MOUTH ONCE DAILY  . Blood Glucose Monitoring Suppl (ONE TOUCH ULTRA MINI) W/DEVICE KIT USE AS DIRECTED TO  TEST BLOOD GLUCOSE ONCE DAILY DX: 250.00  . carvedilol (COREG) 25 MG tablet TAKE ONE-HALF TABLET BY MOUTH TWICE DAILY WITH A MEAL  . Cholecalciferol (VITAMIN D-3) 1000 UNITS CAPS Take 1 capsule by mouth daily.  Marland Kitchen doxycycline (VIBRAMYCIN) 100 MG capsule Take 1 capsule (100 mg total) by mouth 2 (two) times daily.  Marland Kitchen GLIPIZIDE XL 5 MG 24 hr tablet TAKE ONE TABLET BY MOUTH ONCE DAILY  . glucose blood test strip Use as instructed to test blood glucose once daily Dx: E11.9  . ipratropium-albuterol (DUONEB) 0.5-2.5 (3) MG/3ML SOLN Take 3 mLs by nebulization every 4 (four) hours as needed. Use every 4-6 hours as needed for cough and wheezing  . levothyroxine (SYNTHROID, LEVOTHROID) 25 MCG tablet TAKE ONE TABLET BY MOUTH ONCE DAILY BEFORE BREAKFAST  . polyethylene glycol powder (GLYCOLAX/MIRALAX) powder MIX ONE CAPFUL IN LIQUID AND DRINK ONCE DAILY  . predniSONE (DELTASONE) 20 MG tablet Take 2 pills a day for 3 days, then 1 pill a day for 3 days   No facility-administered encounter medications on file as of 04/02/2017.     Activities of Daily Living In your present state of health, do you have any difficulty performing the following activities: 04/02/2017  Hearing? Y  Comment Very HOH  Vision? N  Comment has glasses but does not wear them.  Difficulty concentrating or making decisions? Y  Walking or climbing stairs? Y  Comment impaired balance  Dressing or bathing? Y  Comment has assistance. Walk in shower  Doing errands, shopping? Y  Comment no longer driving  Preparing Food and eating ? Y  Comment son and his wife are caretakers  Using the Toilet? N  In the  past six months, have you accidently leaked urine? N  Do you have problems with loss of bowel control? N  Managing your Medications? Y  Comment son and wife  Managing your Finances? Y  Comment son and wife  Housekeeping or managing your Housekeeping? Y  Comment son and wife  Some recent data might be hidden    Patient Care Team: [name], [name], MD as PCP - General (Family Medicine) Donato Heinz, MD as Consulting Physician (Nephrology) Antionette Fairy Isaias Cowman, MD as Referring Physician (Ophthalmology) Larey Dresser, MD as Consulting Physician (Cardiology)    Assessment:   This is a routine wellness examination for Jazmyne. Physical assessment deferred to PCP.  Exercise Activities and Dietary recommendations Current Exercise Habits: The patient does not participate in regular exercise at present Diet (meal preparation, eat out, water intake, caffeinated beverages, dairy products, fruits and vegetables): well balanced       Goals    None      Fall Risk Fall Risk  04/02/2017 03/01/2016 02/24/2015  Falls in the past year? No No Yes  Number falls in past yr: - - 1  Injury with Fall? - - No    Depression Screen PHQ 2/9 Scores 04/02/2017 03/01/2016 02/24/2015 05/25/2014  PHQ - 2 Score 0 0 0 0     Cognitive Function MMSE - Mini Mental State Exam 03/01/2016 02/24/2015  Not completed: Refused -  Orientation to time - 0  Registration - 3  Attention/ Calculation - 5  Recall - 0  Language- name 2 objects - 2  Language- repeat - 1  Language- follow 3 step command - 3  Language- read & follow direction - 0  Language-read & follow direction-comments - unable to see   Write a sentence - 0  Write a sentence-comments -  unable to see  Copy design - (No Data)  Copy design-comments - unable to see        Immunization History  Administered Date(s) Administered  . Influenza Split 05/11/2011, 01/19/2016  . Influenza Whole 12/18/2005, 01/20/2009, 01/05/2010  .  Influenza,inj,Quad PF,6+ Mos 11/26/2012, 03/12/2014, 02/24/2015  . Pneumococcal Polysaccharide-23 08/27/2008  . Td 05/18/2005    Screening Tests Health Maintenance  Topic Date Due  . DEXA SCAN  04/15/2001  . PNA vac Low Risk Adult (2 of 2 - PCV13) 08/27/2009  . OPHTHALMOLOGY EXAM  03/01/2012  . TETANUS/TDAP  05/19/2015  . HEMOGLOBIN A1C  08/30/2016  . INFLUENZA VACCINE  10/18/2016  . FOOT EXAM  03/01/2017      Plan:   Please scheduled appointment to follow up with Dr.[name] as soon as possible.  Continue to eat heart healthy diet (full of fruits, vegetables, whole grains, lean protein, water--limit salt, fat, and sugar intake) and increase physical activity as tolerated.'   I have personally reviewed and noted the following in the patient's chart:   . Medical and social history . Use of alcohol, tobacco or illicit drugs  . Current medications and supplements . Functional ability and status . Nutritional status . Physical activity . Advanced directives . List of other physicians . Hospitalizations, surgeries, and ER visits in previous 12 months . Vitals . Screenings to include cognitive, depression, and falls . Referrals and appointments  In addition, I have reviewed and discussed with patient certain preventive protocols, quality metrics, and best practice recommendations. A written personalized care plan for preventive services as well as general preventive health recommendations were provided to patient.     Shela Nevin, [place]  04/02/2017

## 2017-04-02 ENCOUNTER — Ambulatory Visit (INDEPENDENT_AMBULATORY_CARE_PROVIDER_SITE_OTHER): Payer: PPO | Admitting: *Deleted

## 2017-04-02 ENCOUNTER — Encounter: Payer: Self-pay | Admitting: *Deleted

## 2017-04-02 VITALS — BP 124/62 | HR 86 | Ht 61.0 in | Wt 99.0 lb

## 2017-04-02 DIAGNOSIS — Z0000 Encounter for general adult medical examination without abnormal findings: Secondary | ICD-10-CM | POA: Diagnosis not present

## 2017-04-02 DIAGNOSIS — E1142 Type 2 diabetes mellitus with diabetic polyneuropathy: Secondary | ICD-10-CM

## 2017-04-02 MED ORDER — GLIPIZIDE ER 5 MG PO TB24: 5.0000 mg | ORAL_TABLET | Freq: Every day | ORAL | 0 refills | Status: DC

## 2017-04-02 NOTE — Patient Instructions (Signed)
Please scheduled appointment to follow up with Dr.[name] as soon as possible.  Continue to eat heart healthy diet (full of fruits, vegetables, whole grains, lean protein, water--limit salt, fat, and sugar intake) and increase physical activity as tolerated.'   Barbara Garner , Thank you for taking time to come for your Medicare Wellness Visit. I appreciate your ongoing commitment to your health goals. Please review the following plan we discussed and let me know if I can assist you in the future.   These are the goals we discussed: Goals    None      This is a list of the screening recommended for you and due dates:  Health Maintenance  Topic Date Due  . DEXA scan (bone density measurement)  04/15/2001  . Pneumonia vaccines (2 of 2 - PCV13) 08/27/2009  . Eye exam for diabetics  03/01/2012  . Tetanus Vaccine  05/19/2015  . Hemoglobin A1C  08/30/2016  . Flu Shot  10/18/2016  . Complete foot exam   03/01/2017    Health Maintenance for Postmenopausal Women Menopause is a normal process in which your reproductive ability comes to an end. This process happens gradually over a span of months to years, usually between the ages of 46 and 36. Menopause is complete when you have missed 12 consecutive menstrual periods. It is important to talk with your health care provider about some of the most common conditions that affect postmenopausal women, such as heart disease, cancer, and bone loss (osteoporosis). Adopting a healthy lifestyle and getting preventive care can help to promote your health and wellness. Those actions can also lower your chances of developing some of these common conditions. What should I know about menopause? During menopause, you may experience a number of symptoms, such as:  Moderate-to-severe hot flashes.  Night sweats.  Decrease in sex drive.  Mood swings.  Headaches.  Tiredness.  Irritability.  Memory problems.  Insomnia.  Choosing to treat or not to treat  menopausal changes is an individual decision that you make with your health care provider. What should I know about hormone replacement therapy and supplements? Hormone therapy products are effective for treating symptoms that are associated with menopause, such as hot flashes and night sweats. Hormone replacement carries certain risks, especially as you become older. If you are thinking about using estrogen or estrogen with progestin treatments, discuss the benefits and risks with your health care provider. What should I know about heart disease and stroke? Heart disease, heart attack, and stroke become more likely as you age. This may be due, in part, to the hormonal changes that your body experiences during menopause. These can affect how your body processes dietary fats, triglycerides, and cholesterol. Heart attack and stroke are both medical emergencies. There are many things that you can do to help prevent heart disease and stroke:  Have your blood pressure checked at least every 1-2 years. High blood pressure causes heart disease and increases the risk of stroke.  If you are 30-97 years old, ask your health care provider if you should take aspirin to prevent a heart attack or a stroke.  Do not use any tobacco products, including cigarettes, chewing tobacco, or electronic cigarettes. If you need help quitting, ask your health care provider.  It is important to eat a healthy diet and maintain a healthy weight. ? Be sure to include plenty of vegetables, fruits, low-fat dairy products, and lean protein. ? Avoid eating foods that are high in solid fats, added sugars,  or salt (sodium).  Get regular exercise. This is one of the most important things that you can do for your health. ? Try to exercise for at least 150 minutes each week. The type of exercise that you do should increase your heart rate and make you sweat. This is known as moderate-intensity exercise. ? Try to do strengthening  exercises at least twice each week. Do these in addition to the moderate-intensity exercise.  Know your numbers.Ask your health care provider to check your cholesterol and your blood glucose. Continue to have your blood tested as directed by your health care provider.  What should I know about cancer screening? There are several types of cancer. Take the following steps to reduce your risk and to catch any cancer development as early as possible. Breast Cancer  Practice breast self-awareness. ? This means understanding how your breasts normally appear and feel. ? It also means doing regular breast self-exams. Let your health care provider know about any changes, no matter how small.  If you are 57 or older, have a clinician do a breast exam (clinical breast exam or CBE) every year. Depending on your age, family history, and medical history, it may be recommended that you also have a yearly breast X-ray (mammogram).  If you have a family history of breast cancer, talk with your health care provider about genetic screening.  If you are at high risk for breast cancer, talk with your health care provider about having an MRI and a mammogram every year.  Breast cancer (BRCA) gene test is recommended for women who have family members with BRCA-related cancers. Results of the assessment will determine the need for genetic counseling and BRCA1 and for BRCA2 testing. BRCA-related cancers include these types: ? Breast. This occurs in males or females. ? Ovarian. ? Tubal. This may also be called fallopian tube cancer. ? Cancer of the abdominal or pelvic lining (peritoneal cancer). ? Prostate. ? Pancreatic.  Cervical, Uterine, and Ovarian Cancer Your health care provider may recommend that you be screened regularly for cancer of the pelvic organs. These include your ovaries, uterus, and vagina. This screening involves a pelvic exam, which includes checking for microscopic changes to the surface of  your cervix (Pap test).  For women ages 21-65, health care providers may recommend a pelvic exam and a Pap test every three years. For women ages 68-65, they may recommend the Pap test and pelvic exam, combined with testing for human papilloma virus (HPV), every five years. Some types of HPV increase your risk of cervical cancer. Testing for HPV may also be done on women of any age who have unclear Pap test results.  Other health care providers may not recommend any screening for nonpregnant women who are considered low risk for pelvic cancer and have no symptoms. Ask your health care provider if a screening pelvic exam is right for you.  If you have had past treatment for cervical cancer or a condition that could lead to cancer, you need Pap tests and screening for cancer for at least 20 years after your treatment. If Pap tests have been discontinued for you, your risk factors (such as having a new sexual partner) need to be reassessed to determine if you should start having screenings again. Some women have medical problems that increase the chance of getting cervical cancer. In these cases, your health care provider may recommend that you have screening and Pap tests more often.  If you have a family history of  uterine cancer or ovarian cancer, talk with your health care provider about genetic screening.  If you have vaginal bleeding after reaching menopause, tell your health care provider.  There are currently no reliable tests available to screen for ovarian cancer.  Lung Cancer Lung cancer screening is recommended for adults 8-57 years old who are at high risk for lung cancer because of a history of smoking. A yearly low-dose CT scan of the lungs is recommended if you:  Currently smoke.  Have a history of at least 30 pack-years of smoking and you currently smoke or have quit within the past 15 years. A pack-year is smoking an average of one pack of cigarettes per day for one year.  Yearly  screening should:  Continue until it has been 15 years since you quit.  Stop if you develop a health problem that would prevent you from having lung cancer treatment.  Colorectal Cancer  This type of cancer can be detected and can often be prevented.  Routine colorectal cancer screening usually begins at age 71 and continues through age 15.  If you have risk factors for colon cancer, your health care provider may recommend that you be screened at an earlier age.  If you have a family history of colorectal cancer, talk with your health care provider about genetic screening.  Your health care provider may also recommend using home test kits to check for hidden blood in your stool.  A small camera at the end of a tube can be used to examine your colon directly (sigmoidoscopy or colonoscopy). This is done to check for the earliest forms of colorectal cancer.  Direct examination of the colon should be repeated every 5-10 years until age 36. However, if early forms of precancerous polyps or small growths are found or if you have a family history or genetic risk for colorectal cancer, you may need to be screened more often.  Skin Cancer  Check your skin from head to toe regularly.  Monitor any moles. Be sure to tell your health care provider: ? About any new moles or changes in moles, especially if there is a change in a mole's shape or color. ? If you have a mole that is larger than the size of a pencil eraser.  If any of your family members has a history of skin cancer, especially at a young age, talk with your health care provider about genetic screening.  Always use sunscreen. Apply sunscreen liberally and repeatedly throughout the day.  Whenever you are outside, protect yourself by wearing long sleeves, pants, a wide-brimmed hat, and sunglasses.  What should I know about osteoporosis? Osteoporosis is a condition in which bone destruction happens more quickly than new bone creation.  After menopause, you may be at an increased risk for osteoporosis. To help prevent osteoporosis or the bone fractures that can happen because of osteoporosis, the following is recommended:  If you are 32-70 years old, get at least 1,000 mg of calcium and at least 600 mg of vitamin D per day.  If you are older than age 61 but younger than age 24, get at least 1,200 mg of calcium and at least 600 mg of vitamin D per day.  If you are older than age 84, get at least 1,200 mg of calcium and at least 800 mg of vitamin D per day.  Smoking and excessive alcohol intake increase the risk of osteoporosis. Eat foods that are rich in calcium and vitamin D, and do  weight-bearing exercises several times each week as directed by your health care provider. What should I know about how menopause affects my mental health? Depression may occur at any age, but it is more common as you become older. Common symptoms of depression include:  Low or sad mood.  Changes in sleep patterns.  Changes in appetite or eating patterns.  Feeling an overall lack of motivation or enjoyment of activities that you previously enjoyed.  Frequent crying spells.  Talk with your health care provider if you think that you are experiencing depression. What should I know about immunizations? It is important that you get and maintain your immunizations. These include:  Tetanus, diphtheria, and pertussis (Tdap) booster vaccine.  Influenza every year before the flu season begins.  Pneumonia vaccine.  Shingles vaccine.  Your health care provider may also recommend other immunizations. This information is not intended to replace advice given to you by your health care provider. Make sure you discuss any questions you have with your health care provider. Document Released: 04/28/2005 Document Revised: 09/24/2015 Document Reviewed: 12/08/2014 Elsevier Interactive Patient Education  2018 [employer].

## 2017-04-02 NOTE — Progress Notes (Signed)
I have reviewed MWE note by Ms. Britt and agree with her documentation

## 2017-04-07 NOTE — Progress Notes (Addendum)
[facility] [address], [place], [place] [address] [phone]  Date:  04/09/2017   Name:  Barbara Garner   DOB:  1934-03-30   MRN:  [id number]  PCP:  Darreld Mclean, MD    Chief Complaint: Follow-up (Pt here for f/u visit. )   History of Present Illness:  Barbara Garner is a 83 y.o. very pleasant female patient who presents with the following:  Here today for a recheck visit- I last saw her for a visit about a year ago, but she had her MWE recently with Glenard Haring.  She noted that pt has significant dementia sx which are not new  She has a history of hypothyroidism, DM, CBG, pacemaker She is living in the home of her son and his wife, and lives with her other son in the finished basement.   She does not do any cooking, she does not drive They have not had any concerns about wandering behavior or otherwise putting herself in danger She is continent of bowel and bladder She has not had any breathing sx as of late   His kidney doc is Colodonado - she sees him every 9 months now as she has been stable Her son does not notice any vision concerns, but she is [facility]  We will give her prevnar 55 today She could also use a dexa scan and we think she could tolerate this test so will set up for her   Lab Results  Component Value Date   HGBA1C 6.9 (H) 03/01/2016   Lab Results  Component Value Date   TSH 4.36 04/12/2016      Patient Active Problem List   Diagnosis Date Noted  . Thyroid activity decreased 05/14/2015  . Postmenopausal estrogen deficiency 03/10/2015  . Hyperkalemia 05/25/2014  . CN (constipation) 05/08/2014  . Diabetes mellitus type 2, controlled ([place]) 03/11/2014  . Dementia 12/17/2013  . Memory loss, short term 10/29/2013  . Low back pain 09/24/2013  . Sinus bradycardia 09/24/2012  . Vitamin D deficiency disease 08/23/2012  . Lichen sclerosus et atrophicus of the vulva 04/05/2012  . Chronic diastolic  CHF (congestive heart failure) ([place]) 11/15/2011  . Pacemaker-Medtronic 09/13/2011  . Atrial fibrillation ([place]) 09/12/2011  . Complete heart block-intermittent 09/09/2011  . S/P aortic valve replacement 09/06/2011  . S/P CABG x 3 09/06/2011  . IVCD (intraventricular conduction defect)   . CKD (chronic kidney disease) stage 4, GFR 15-29 ml/min (HCC)   . Carotid stenosis 05/30/2011  . Squamous cell carcinoma of skin 07/21/2010  . HYPERCHOLESTEROLEMIA, PURE 09/20/2006  . ANEMIA-IRON DEFICIENCY 06/27/2006  . PERIPHERAL NEUROPATHY 06/27/2006  . Essential hypertension 06/27/2006  . MYOCARDIAL INFARCTION, HX OF 06/27/2006  . CORONARY ARTERY DISEASE 06/27/2006  . ASTHMA 06/27/2006  . ECZEMA 06/27/2006    Past Medical History:  Diagnosis Date  . Atrioventricular block, complete -intermittent   . CAD  S/P CABG x 3 08/2011    LIMA to diagonal branch, SVG to OM1, SVG to PDA, EVH via bilateral thighs  . Carotid artery disease ([place])    a. Carotid US 1/76 with 16% LICA stenosis;  b. Carotid US 11/16:  Bilateral ICA 40-59% >> FU 1 year // c. Carotid US 11/17: R 40-59; L 1-39 >> follow-up 1 year  . Chronic diastolic CHF (congestive heart failure) ([place])   . CKD (chronic kidney disease) stage 4, GFR 15-29 ml/min (HCC)    Cr 1.6 in 6/11,  sees Dr. Arty Baumgartner  . Contact dermatitis and other eczema, due to unspecified cause   . Esophageal reflux   . Extrinsic asthma, unspecified    hasn't used inhaler in past year  . Headache(784.0)   . hypertension   . Hypothyroidism   . Idiopathic peripheral neuropathy   . Impaired vision    pt. reports that she identifies her meds by looking at the pillls, she is not able to read labels on bottles  . Iron deficiency anemia, unspecified   . LBBB (left bundle branch block)   . Memory loss   . Pacemaker MDT dual chamber    DOI 08/2011  . Pure hypercholesterolemia   . Severe aortic stenosis -S/P AVR     19 mm [facility] Ease pericardial tissue valve  . Type  II or unspecified type diabetes mellitus without mention of complication, not stated as uncontrolled     Past Surgical History:  Procedure Laterality Date  . ABDOMINAL HYSTERECTOMY  1977   partial; fibroids  . AORTIC VALVE REPLACEMENT  09/06/2011   Procedure: AORTIC VALVE REPLACEMENT (AVR);  Surgeon: Rexene Alberts, MD;  Location: [place];  Service: Open Heart Surgery;  Laterality: N/A;  . bladder tack  1977  . CARDIAC CATHETERIZATION    . CARDIOVASCULAR STRESS TEST  10/2002   normal (per patient)  . carotid dopplers  2006  . CATARACT EXTRACTION, BILATERAL  2003  . COLONOSCOPY  04/1999   1 polyp  . COLONOSCOPY  06/2004   Neg. Int hem  . COLONOSCOPY  08/2013  . CORONARY ARTERY BYPASS GRAFT  09/06/2011   Procedure: CORONARY ARTERY BYPASS GRAFTING (CABG);  Surgeon: Rexene Alberts, MD;  Location: [place];  Service: Open Heart Surgery;  Laterality: N/A;  . DEXA  10/2003 ,09/2005   osteoporosis,  Osteopenia  . ESOPHAGEAL DILATION  1997  . ESOPHAGOGASTRODUODENOSCOPY  04/1999   HH; "watermelon stomach" (severe gastritis)  . ESOPHAGOGASTRODUODENOSCOPY  04/2002   Gastritis  . LEFT AND RIGHT HEART CATHETERIZATION WITH CORONARY ANGIOGRAM N/A 08/11/2011   Procedure: LEFT AND RIGHT HEART CATHETERIZATION WITH CORONARY ANGIOGRAM;  Surgeon: Larey Dresser, MD;  Location[facility] C[facility] LAB;  Service: Cardiovascular;  Laterality: N/A;  . Opthy  11/1998;12/01;11/02  . PERMANENT PACEMAKER INSERTION N/A 25-Sep-202013   Procedure: PERMANENT PACEMAKER INSERTION;  Surgeon: Deboraha Sprang, MD;  Location: [facility] CATH LAB;  Service: Cardiovascular;  Laterality: N/A;  . PTCA  [id number]   5 blockages  . REFRACTIVE SURGERY  2006    Social History   Tobacco Use  . Smoking status: Never Smoker  . Smokeless tobacco: Never Used  Substance Use Topics  . Alcohol use: No    Alcohol/week: 0.0 oz  . Drug use: No    Family History  Problem Relation Age of Onset  . Stroke Mother        died in her 20's  . Stroke Father         died in his 49's  . Diabetes Father   . Prostate cancer Son   . Diabetes Son        #2  . Hypertension Son        #3  . Thyroid disease Neg Hx   . Heart attack Neg Hx     Allergies  Allergen Reactions  . Diflucan [Fluconazole] Rash    Renal Failure  . Keflex [Cephalexin]     rash  . Metformin     REACTION: intolerant  . Promethazine Hcl  REACTION: u/k    Medication list has been reviewed and updated.  Current Outpatient Medications on File Prior to Visit  Medication Sig Dispense Refill  . aspirin 81 MG EC tablet Take 1 tablet (81 mg total) by mouth daily. 30 tablet 3  . atorvastatin (LIPITOR) 40 MG tablet TAKE ONE TABLET BY MOUTH ONCE DAILY 90 tablet 3  . Blood Glucose Monitoring Suppl (ONE TOUCH ULTRA MINI) W/DEVICE KIT USE AS DIRECTED TO TEST BLOOD GLUCOSE ONCE DAILY DX: 250.00 1 each 0  . carvedilol (COREG) 25 MG tablet TAKE ONE-HALF TABLET BY MOUTH TWICE DAILY WITH A MEAL 90 tablet 3  . Cholecalciferol (VITAMIN D-3) 1000 UNITS CAPS Take 1 capsule by mouth daily.    Marland Kitchen glipiZIDE (GLIPIZIDE XL) 5 MG 24 hr tablet Take 1 tablet (5 mg total) by mouth daily. 90 tablet 0  . glucose blood test strip Use as instructed to test blood glucose once daily Dx: E11.9 100 each 12  . levothyroxine (SYNTHROID, LEVOTHROID) 25 MCG tablet TAKE ONE TABLET BY MOUTH ONCE DAILY BEFORE BREAKFAST 90 tablet 1  . polyethylene glycol powder (GLYCOLAX/MIRALAX) powder MIX ONE CAPFUL IN LIQUID AND DRINK ONCE DAILY (Patient taking differently: MIX ONE CAPFUL IN LIQUID AND DRINK AS NEEDED) 765 g 3  . predniSONE (DELTASONE) 20 MG tablet Take 2 pills a day for 3 days, then 1 pill a day for 3 days 9 tablet 0  . albuterol (PROVENTIL HFA;VENTOLIN HFA) 108 (90 BASE) MCG/ACT inhaler Inhale 2 puffs into the lungs every 6 (six) hours as needed for wheezing or shortness of breath. (Patient not taking: Reported on 04/09/2017) 1 Inhaler 0  . doxycycline (VIBRAMYCIN) 100 MG capsule Take 1 capsule (100 mg total) by mouth 2  (two) times daily. (Patient not taking: Reported on 04/09/2017) 20 capsule 0  . ipratropium-albuterol (DUONEB) 0.5-2.5 (3) MG/3ML SOLN Take 3 mLs by nebulization every 4 (four) hours as needed. Use every 4-6 hours as needed for cough and wheezing (Patient not taking: Reported on 04/09/2017) 360 mL 1   No current facility-administered medications on file prior to visit.     Review of Systems:  As per HPI- otherwise negative. Pt is not really able to give any useful history due to her dementia.  However she does state that she feels fine today  Her son is with her and helps to give the history   Physical Examination: Vitals:   04/09/17 1020  BP: 110/70  Pulse: 66  SpO2: 98%   Vitals:   04/09/17 1020  Weight: 100 lb (45.4 kg)  Height: _0  (1.549 m)   Body mass index is 18.89 kg/m. Ideal Body Weight: Weight in (lb) to have BMI = 25: 132  GEN: WDWN, NAD, Non-toxic, Alert, tiny build.  appears to be comfortable, clean and appropriately dressed for the weather  HEENT: Atraumatic, Normocephalic. Neck supple. No masses, No LAD. Ears and Nose: No external deformity. CV: RRR, No M/G/R. No JVD. No thrill. No extra heart sounds. PULM: CTA B, no wheezes, crackles, rhonchi. No retractions. No resp. distress. No accessory muscle use. ABD: S, NT, ND EXTR: No c/c/e NEURO Normal gait for pt. Uses a cane PSYCH: dementia- does not say much.  Can follow a simple command such as to open her mouth.  Calm demeanor.    Assessment and Plan: Immunization due - Plan: Pneumococcal conjugate vaccine 13-valent IM  Controlled type 2 diabetes mellitus with diabetic polyneuropathy, without long-term current use of insulin ([place]) - Plan: Comprehensive metabolic  panel, Hemoglobin A1c  Hypothyroidism, unspecified type - Plan: TSH  Essential hypertension - Plan: CBC  Estrogen deficiency - Plan: DG Bone Density  Here today for a follow-up visit  Stable significant dementia- she is in a safe place living  with her son and seems to be well cared for  prevnar today Ordered dexa scan Will plan further follow- up pending labs. Plan to start seeing her every 6 months as Dr. Loletha Grayer has decreased the frequency of her visits with him   Signed Lamar Blinks, MD  Received her labs 1/26- called her son and [facility]- will send my chart message Colonoscopy in 2015  Your blood count looks ok- mild anemia, likely due to chronic kidney problems.  However this is stable and a bit better than at other recent labs Metabolic profile is ok- slight elevation of potassium.  We can repeat her potassium at next visit  A1c shows good control of diabetes  TSH is slightly high- I think we can either slightly increase her thyroid medication dose, or just repeat a TSH in about one month and see if it normalizes again.  What is your preference? Results for orders placed or performed in visit on 04/09/17  CBC  Result Value Ref Range   WBC 5.7 4.0 - 10.5 K/uL   RBC 3.59 (L) 3.87 - 5.11 Mil/uL   Platelets 184.0 150.0 - 400.0 K/uL   Hemoglobin 10.9 (L) 12.0 - 15.0 g/dL   HCT 33.4 (L) 36.0 - 46.0 %   MCV 93.0 78.0 - 100.0 fl   MCHC 32.6 30.0 - 36.0 g/dL   RDW 14.3 11.5 - 15.5 %  Comprehensive metabolic panel  Result Value Ref Range   Sodium 137 135 - 145 mEq/L   Potassium 5.3 (H) 3.5 - 5.1 mEq/L   Chloride 100 96 - 112 mEq/L   CO2 32 19 - 32 mEq/L   Glucose, Bld 114 (H) 70 - 99 mg/dL   BUN 38 (H) 6 - 23 mg/dL   Creatinine, Ser 1.21 (H) 0.40 - 1.20 mg/dL   Total Bilirubin 0.5 0.2 - 1.2 mg/dL   Alkaline Phosphatase 84 39 - 117 U/L   AST 18 0 - 37 U/L   ALT 11 0 - 35 U/L   Total Protein 7.3 6.0 - 8.3 g/dL   Albumin 3.9 3.5 - 5.2 g/dL   Calcium 9.3 8.4 - 10.5 mg/dL   GFR 45.39 (L) >60.00 mL/min  Hemoglobin A1c  Result Value Ref Range   Hgb A1c MFr Bld 6.5 4.6 - 6.5 %  TSH  Result Value Ref Range   TSH 5.08 (H) 0.35 - 4.50 uIU/mL

## 2017-04-09 ENCOUNTER — Encounter: Payer: Self-pay | Admitting: Family Medicine

## 2017-04-09 ENCOUNTER — Ambulatory Visit (INDEPENDENT_AMBULATORY_CARE_PROVIDER_SITE_OTHER): Payer: PPO | Admitting: Family Medicine

## 2017-04-09 VITALS — BP 110/70 | HR 66 | Ht 61.0 in | Wt 100.0 lb

## 2017-04-09 DIAGNOSIS — F0390 Unspecified dementia without behavioral disturbance: Secondary | ICD-10-CM | POA: Diagnosis not present

## 2017-04-09 DIAGNOSIS — I10 Essential (primary) hypertension: Secondary | ICD-10-CM

## 2017-04-09 DIAGNOSIS — E039 Hypothyroidism, unspecified: Secondary | ICD-10-CM

## 2017-04-09 DIAGNOSIS — E2839 Other primary ovarian failure: Secondary | ICD-10-CM

## 2017-04-09 DIAGNOSIS — Z23 Encounter for immunization: Secondary | ICD-10-CM

## 2017-04-09 DIAGNOSIS — E1142 Type 2 diabetes mellitus with diabetic polyneuropathy: Secondary | ICD-10-CM | POA: Diagnosis not present

## 2017-04-09 LAB — COMPREHENSIVE METABOLIC PANEL
ALBUMIN: 3.9 g/dL (ref 3.5–5.2)
ALK PHOS: 84 U/L (ref 39–117)
ALT: 11 U/L (ref 0–35)
AST: 18 U/L (ref 0–37)
BILIRUBIN TOTAL: 0.5 mg/dL (ref 0.2–1.2)
BUN: 38 mg/dL — ABNORMAL HIGH (ref 6–23)
CALCIUM: 9.3 mg/dL (ref 8.4–10.5)
CO2: 32 mEq/L (ref 19–32)
CREATININE: 1.21 mg/dL — AB (ref 0.40–1.20)
Chloride: 100 mEq/L (ref 96–112)
GFR: 45.39 mL/min — AB (ref >60.00)
Glucose, Bld: 114 mg/dL — ABNORMAL HIGH (ref 70–99)
Potassium: 5.3 mEq/L — ABNORMAL HIGH (ref 3.5–5.1)
Sodium: 137 mEq/L (ref 135–145)
TOTAL PROTEIN: 7.3 g/dL (ref 6.0–8.3)

## 2017-04-09 LAB — CBC
HCT: 33.4 % — ABNORMAL LOW (ref 36.0–46.0)
HEMOGLOBIN: 10.9 g/dL — AB (ref 12.0–15.0)
MCHC: 32.6 g/dL (ref 30.0–36.0)
MCV: 93 fl (ref 78.0–100.0)
PLATELETS: 184 10*3/uL (ref 150.0–400.0)
RBC: 3.59 Mil/uL — AB (ref 3.87–5.11)
RDW: 14.3 % (ref 11.5–15.5)
WBC: 5.7 10*3/uL (ref 4.0–10.5)

## 2017-04-09 LAB — TSH: TSH: 5.08 u[IU]/mL — AB (ref 0.35–4.50)

## 2017-04-09 LAB — HEMOGLOBIN A1C: HEMOGLOBIN A1C: 6.5 % (ref 4.6–6.5)

## 2017-04-09 NOTE — Patient Instructions (Signed)
It was good to see you today!  I will be in touch with your labs You got your pneumonia booster today I will also set you up for a bone density scan to make sure you do not have osteoporosis

## 2017-04-13 ENCOUNTER — Other Ambulatory Visit (HOSPITAL_BASED_OUTPATIENT_CLINIC_OR_DEPARTMENT_OTHER): Payer: PPO

## 2017-04-14 ENCOUNTER — Encounter: Payer: Self-pay | Admitting: Family Medicine

## 2017-04-28 ENCOUNTER — Other Ambulatory Visit: Payer: Self-pay | Admitting: Family Medicine

## 2017-08-20 ENCOUNTER — Other Ambulatory Visit: Payer: Self-pay | Admitting: Family Medicine

## 2017-08-20 DIAGNOSIS — I10 Essential (primary) hypertension: Secondary | ICD-10-CM

## 2017-08-20 DIAGNOSIS — E034 Atrophy of thyroid (acquired): Secondary | ICD-10-CM

## 2017-09-02 NOTE — Progress Notes (Addendum)
[facility] [address] [place], [place] [address] [phone]  Date:  09/03/2017   Name:  Barbara Garner   DOB:  [birth year]   MRN:  [id number]  PCP:  Darreld Mclean, MD    Chief Complaint: Dementia; Hypertension; Hyperlipidemia; and Diabetes   History of Present Illness:  Barbara Garner is a 83 y.o. very pleasant female patient who presents with the following:  Elderly lady with multiple medical problems here today for a periodic recheck I last saw her in the office in January: She has a history of hypothyroidism, DM, CBG, pacemaker She is living in the home of her son and his wife, and lives with her other son in the finished basement.   She does not do any cooking, she does not drive They have not had any concerns about wandering behavior or otherwise putting herself in danger She is continent of bowel and bladder She has not had any breathing sx as of late   His kidney doc is [place] - she sees him every 9 months now as she has been stable  Eye exam: they are not sure Tetanus Lab Results  Component Value Date   HGBA1C 6.5 04/09/2017   I don't think she has seen cardiology since 2016: 1. Aortic Stenosis s/p AVR: She is doing well. AVR was okay at echocardiogram in 07/2013. 2. CAD s/p CABG: No angina. Continue aspirin, statin. 3. Bilateral Carotid Stenosis:   She is due for Carotid US.  Will arrange.  4. Hyperlipidemia: Continue statin. She will get her PCP to check Lipids and LFTs at some point in the next 6 mos.  5. Hypertension: Controlled.  6. CHB s/p PPM: FU with EP as planned.  7. Chronic Diastolic CHF: Volume stable  It looks like she is also overdue for her carotid artery [place], if she still wishes to do this test   Her son [name] is with her today; she lives in his home, in a basement apt with another of her sons.  [name] provides nearly all of the history today He also  had wondered about cardiology eval - he reports that she was being seen a few times a year for a while, but had not been scheduled in recent years.    Warren is not alone very much, so they are not sure if she displays any wandering behavior however they have not given her the opportunity She did fall down some steps last year, she is no longer around steps No recent falls She does uses a cane but never a walker  She reports that she can see ok to watch TV but does not like to read  BP Readings from Last 3 Encounters:  09/03/17 (!) 147/57  04/09/17 110/70  04/02/17 124/62   . Wt Readings from Last 3 Encounters:  09/03/17 103 lb 3.2 oz (46.8 kg)  04/09/17 100 lb (45.4 kg)  04/02/17 99 lb (44.9 kg)   Barbara Garner's dementia is progressing. Today she is not able to name the month, year or season, and cannot name a pen or wristwatch.  She is able to answer some persoanl questions when prompted and is calm, appears comfortable She can recall her DOB  Patient Active Problem List   Diagnosis Date Noted  . Thyroid activity decreased 05/14/2015  . Hyperkalemia 05/25/2014  . CN (constipation) 05/08/2014  . Diabetes mellitus type 2, controlled ([place]) 03/11/2014  . Dementia 12/17/2013  .  Memory loss, short term 10/29/2013  . Low back pain 09/24/2013  . Sinus bradycardia 09/24/2012  . Vitamin D deficiency disease 08/23/2012  . Lichen sclerosus et atrophicus of the vulva 04/05/2012  . Chronic diastolic CHF (congestive heart failure) ([place]) 11/15/2011  . Pacemaker-Medtronic 09/13/2011  . Atrial fibrillation ([place]) 09/12/2011  . Complete heart block-intermittent 09/09/2011  . S/P aortic valve replacement 09/06/2011  . S/P CABG x 3 09/06/2011  . IVCD (intraventricular conduction defect)   . CKD (chronic kidney disease) stage 4, GFR 15-29 ml/min (HCC)   . Carotid stenosis 05/30/2011  . Squamous cell carcinoma of skin 07/21/2010  . HYPERCHOLESTEROLEMIA, PURE 09/20/2006  . ANEMIA-IRON DEFICIENCY  06/27/2006  . PERIPHERAL NEUROPATHY 06/27/2006  . Essential hypertension 06/27/2006  . MYOCARDIAL INFARCTION, HX OF 06/27/2006  . CORONARY ARTERY DISEASE 06/27/2006  . ASTHMA 06/27/2006  . ECZEMA 06/27/2006    Past Medical History:  Diagnosis Date  . Atrioventricular block, complete -intermittent   . CAD  S/P CABG x 3 08/2011    LIMA to diagonal branch, SVG to OM1, SVG to PDA, EVH via bilateral thighs  . Carotid artery disease ([place])    a. Carotid US 1/76 with 16% LICA stenosis;  b. Carotid US 11/16:  Bilateral ICA 40-59% >> FU 1 year // c. Carotid US 11/17: R 40-59; L 1-39 >> follow-up 1 year  . Chronic diastolic CHF (congestive heart failure) ([place])   . CKD (chronic kidney disease) stage 4, GFR 15-29 ml/min (HCC)    Cr 1.6 in 6/11, sees Dr. Arty Baumgartner  . Contact dermatitis and other eczema, due to unspecified cause   . Esophageal reflux   . Extrinsic asthma, unspecified    hasn't used inhaler in past year  . Headache(784.0)   . hypertension   . Hypothyroidism   . Idiopathic peripheral neuropathy   . Impaired vision    pt. reports that she identifies her meds by looking at the pillls, she is not able to read labels on bottles  . Iron deficiency anemia, unspecified   . LBBB (left bundle branch block)   . Memory loss   . Pacemaker MDT dual chamber    DOI 08/2011  . Pure hypercholesterolemia   . Severe aortic stenosis -S/P AVR     19 mm [facility] Ease pericardial tissue valve  . Type II or unspecified type diabetes mellitus without mention of complication, not stated as uncontrolled     Past Surgical History:  Procedure Laterality Date  . ABDOMINAL HYSTERECTOMY  1977   partial; fibroids  . AORTIC VALVE REPLACEMENT  09/06/2011   Procedure: AORTIC VALVE REPLACEMENT (AVR);  Surgeon: Rexene Alberts, MD;  Location: [facility];  Service: Open Heart Surgery;  Laterality: N/A;  . bladder tack  1977  . CARDIAC CATHETERIZATION    . CARDIOVASCULAR STRESS TEST  10/2002   normal (per  patient)  . carotid dopplers  2006  . CATARACT EXTRACTION, BILATERAL  2003  . COLONOSCOPY  04/1999   1 polyp  . COLONOSCOPY  06/2004   Neg. Int hem  . COLONOSCOPY  08/2013  . CORONARY ARTERY BYPASS GRAFT  09/06/2011   Procedure: CORONARY ARTERY BYPASS GRAFTING (CABG);  Surgeon: Rexene Alberts, MD;  Location: [facility];  Service: Open Heart Surgery;  Laterality: N/A;  . DEXA  10/2003 ,09/2005   osteoporosis,  Osteopenia  . ESOPHAGEAL DILATION  1997  . ESOPHAGOGASTRODUODENOSCOPY  04/1999   HH; "watermelon stomach" (severe gastritis)  . ESOPHAGOGASTRODUODENOSCOPY  04/2002   Gastritis  .  LEFT AND RIGHT HEART CATHETERIZATION WITH CORONARY ANGIOGRAM N/A 08/11/2011   Procedure: LEFT AND RIGHT HEART CATHETERIZATION WITH CORONARY ANGIOGRAM;  Surgeon: Larey Dresser, MD;  Location: [facility] CATH LAB;  Service: Cardiovascular;  Laterality: N/A;  . Opthy  11/1998;12/01;11/02  . PERMANENT PACEMAKER INSERTION N/A 12/31/2011   Procedure: PERMANENT PACEMAKER INSERTION;  Surgeon: Deboraha Sprang, MD;  Location: [facility] CATH LAB;  Service: Cardiovascular;  Laterality: N/A;  . PTCA  [id number]   5 blockages  . REFRACTIVE SURGERY  2006    Social History   Tobacco Use  . Smoking status: Never Smoker  . Smokeless tobacco: Never Used  Substance Use Topics  . Alcohol use: No    Alcohol/week: 0.0 oz  . Drug use: No    Family History  Problem Relation Age of Onset  . Stroke Mother        died in her 40's  . Stroke Father        died in his 68's  . Diabetes Father   . Prostate cancer Son   . Diabetes Son        #2  . Hypertension Son        #3  . Thyroid disease Neg Hx   . Heart attack Neg Hx     Allergies  Allergen Reactions  . Diflucan [Fluconazole] Rash    Renal Failure  . Keflex [Cephalexin]     rash  . Metformin     REACTION: intolerant  . Promethazine Hcl     REACTION: u/k    Medication list has been reviewed and updated.  Current Outpatient Medications on File Prior to Visit  Medication Sig  Dispense Refill  . aspirin 81 MG EC tablet Take 1 tablet (81 mg total) by mouth daily. 30 tablet 3  . atorvastatin (LIPITOR) 40 MG tablet TAKE ONE TABLET BY MOUTH ONCE DAILY 90 tablet 3  . Blood Glucose Monitoring Suppl (ONE TOUCH ULTRA MINI) W/DEVICE KIT USE AS DIRECTED TO TEST BLOOD GLUCOSE ONCE DAILY DX: 250.00 1 each 0  . carvedilol (COREG) 25 MG tablet TAKE ONE-HALF TABLET BY MOUTH TWICE DAILY WITH MEALS 90 tablet 0  . Cholecalciferol (VITAMIN D-3) 1000 UNITS CAPS Take 1 capsule by mouth daily.    Marland Kitchen glipiZIDE (GLIPIZIDE XL) 5 MG 24 hr tablet Take 1 tablet (5 mg total) by mouth daily. 90 tablet 0  . glucose blood test strip Use as instructed to test blood glucose once daily Dx: E11.9 100 each 12  . ipratropium-albuterol (DUONEB) 0.5-2.5 (3) MG/3ML SOLN Take 3 mLs by nebulization every 4 (four) hours as needed. Use every 4-6 hours as needed for cough and wheezing 360 mL 1  . levothyroxine (SYNTHROID, LEVOTHROID) 25 MCG tablet TAKE 1 TABLET BY MOUTH ONCE DAILY BEFORE BREAKFAST 90 tablet 0  . polyethylene glycol powder (GLYCOLAX/MIRALAX) powder MIX ONE CAPFUL IN LIQUID AND DRINK ONCE DAILY (Patient taking differently: MIX ONE CAPFUL IN LIQUID AND DRINK AS NEEDED) 765 g 3   No current facility-administered medications on file prior to visit.     Review of Systems:  .As per HPI- otherwise negative. No loss of weight No pain noted  Physical Examination: Vitals:   09/03/17 1346 09/03/17 1350  BP: (!) 159/71 (!) 147/57  Pulse: 83 61  Resp: 16   Temp: 97.8 F (36.6 C)   SpO2: 100%    Vitals:   09/03/17 1346  Weight: 103 lb 3.2 oz (46.8 kg)  Height: '5\' 1"'  (1.549 m)  Body mass index is 19.5 kg/m. Ideal Body Weight: Weight in (lb) to have BMI = 25: 132  GEN: WDWN, NAD, Non-toxic, alert but responds only when directly prompted or spoken to, appears older than age, seated in chair and accompanied by her son today, clean and well groomed  HEENT: Atraumatic, Normocephalic. Neck supple.  No masses, No LAD.Bilateral TM wnl, oropharynx normal.  PEERL,EOMI.   Ears and Nose: No external deformity. CV: RRR, No M/G/R. No JVD. No thrill. No extra heart sounds. PULM: CTA B, no wheezes, crackles, rhonchi. No retractions. No resp. distress. No accessory muscle use. ABD: S, NT, ND, +BS. No rebound. No HSM. EXTR: No c/c/e NEURO Normal gait for age, uses a walker PSYCH: quiet, flat affect. Not depressed or anxious appearing.  Calm demeanor.    Assessment and Plan: Controlled type 2 diabetes mellitus with diabetic polyneuropathy, without long-term current use of insulin ([place]) - Plan: Comprehensive metabolic panel, Hemoglobin A1c  Essential hypertension - Plan: CBC  Hypothyroidism due to acquired atrophy of thyroid - Plan: TSH  Dyslipidemia - Plan: Lipid panel  Following up today Vera's dementia is progressing Will check A1c today- may need to take her off glipizide due to hypoglycemia risk. She may forget to eat or be unable to verbalize sx of hypoglycemia Await labs as above Message to her cardiology provider about follow-up-?if needed   Signed Lamar Blinks, MD  Received labs Results for orders placed or performed in visit on 09/03/17  CBC  Result Value Ref Range   WBC 7.1 4.0 - 10.5 K/uL   RBC 3.56 (L) 3.87 - 5.11 Mil/uL   Platelets 186.0 150.0 - 400.0 K/uL   Hemoglobin 10.7 (L) 12.0 - 15.0 g/dL   HCT 32.4 (L) 36.0 - 46.0 %   MCV 91.0 78.0 - 100.0 fl   MCHC 33.1 30.0 - 36.0 g/dL   RDW 15.3 11.5 - 15.5 %  Comprehensive metabolic panel  Result Value Ref Range   Sodium 140 135 - 145 mEq/L   Potassium 4.8 3.5 - 5.1 mEq/L   Chloride 106 96 - 112 mEq/L   CO2 26 19 - 32 mEq/L   Glucose, Bld 75 70 - 99 mg/dL   BUN 51 (H) 6 - 23 mg/dL   Creatinine, Ser 1.31 (H) 0.40 - 1.20 mg/dL   Total Bilirubin 0.4 0.2 - 1.2 mg/dL   Alkaline Phosphatase 100 39 - 117 U/L   AST 17 0 - 37 U/L   ALT 10 0 - 35 U/L   Total Protein 7.4 6.0 - 8.3 g/dL   Albumin 4.1 3.5 - 5.2 g/dL    Calcium 9.3 8.4 - 10.5 mg/dL   GFR 41.38 (L) >60.00 mL/min  Hemoglobin A1c  Result Value Ref Range   Hgb A1c MFr Bld 6.5 4.6 - 6.5 %  Lipid panel  Result Value Ref Range   Cholesterol 113 0 - 200 mg/dL   Triglycerides 75.0 0.0 - 149.0 mg/dL   HDL 53.40 >39.00 mg/dL   VLDL 15.0 0.0 - 40.0 mg/dL   LDL Cholesterol 44 0 - 99 mg/dL   Total CHOL/HDL Ratio 2    NonHDL 59.10   TSH  Result Value Ref Range   TSH 2.15 0.35 - 4.50 uIU/mL   Message to pt Cholesterol and thyroid are very good Anairis's diabetes is under fine control. In fact, I would suggest that we take her off the glipizide at this point.   I think the risk of her blood sugar getting too  low (and her not being able to express symptoms) is greater than the benefit of this medication to her.   Renal function and anemia appear to be stable  Take care and let's plan to visit in 4-6 months JC

## 2017-09-03 ENCOUNTER — Encounter: Payer: Self-pay | Admitting: Family Medicine

## 2017-09-03 ENCOUNTER — Ambulatory Visit (INDEPENDENT_AMBULATORY_CARE_PROVIDER_SITE_OTHER): Payer: PPO | Admitting: Family Medicine

## 2017-09-03 VITALS — BP 147/57 | HR 61 | Temp 97.8°F | Resp 16 | Ht 61.0 in | Wt 103.2 lb

## 2017-09-03 DIAGNOSIS — E785 Hyperlipidemia, unspecified: Secondary | ICD-10-CM

## 2017-09-03 DIAGNOSIS — E1142 Type 2 diabetes mellitus with diabetic polyneuropathy: Secondary | ICD-10-CM

## 2017-09-03 DIAGNOSIS — E034 Atrophy of thyroid (acquired): Secondary | ICD-10-CM | POA: Diagnosis not present

## 2017-09-03 DIAGNOSIS — I10 Essential (primary) hypertension: Secondary | ICD-10-CM

## 2017-09-03 NOTE — Patient Instructions (Signed)
It was good to see you again today!  I will be in touch with labs asap I will touch base with your cardiology office to see if they need to see you for a visit  It does seem that Barbara Garner's dementia/ memory loss is getting to be severe.  Please let me know if any problems such as agitation or wandering.    She likely does need to be monitored for her safety and not left alone  Assuming labs are ok, let's plan to visit in 6 months

## 2017-09-04 ENCOUNTER — Encounter: Payer: Self-pay | Admitting: Family Medicine

## 2017-09-04 LAB — CBC
HEMATOCRIT: 32.4 % — AB (ref 36.0–46.0)
HEMOGLOBIN: 10.7 g/dL — AB (ref 12.0–15.0)
MCHC: 33.1 g/dL (ref 30.0–36.0)
MCV: 91 fl (ref 78.0–100.0)
Platelets: 186 10*3/uL (ref 150.0–400.0)
RBC: 3.56 Mil/uL — ABNORMAL LOW (ref 3.87–5.11)
RDW: 15.3 % (ref 11.5–15.5)
WBC: 7.1 10*3/uL (ref 4.0–10.5)

## 2017-09-04 LAB — LIPID PANEL
CHOL/HDL RATIO: 2
Cholesterol: 113 mg/dL (ref 0–200)
HDL: 53.4 mg/dL (ref >39.00)
LDL Cholesterol: 44 mg/dL (ref 0–99)
NONHDL: 59.1
TRIGLYCERIDES: 75 mg/dL (ref 0.0–149.0)
VLDL: 15 mg/dL (ref 0.0–40.0)

## 2017-09-04 LAB — COMPREHENSIVE METABOLIC PANEL
ALK PHOS: 100 U/L (ref 39–117)
ALT: 10 U/L (ref 0–35)
AST: 17 U/L (ref 0–37)
Albumin: 4.1 g/dL (ref 3.5–5.2)
BUN: 51 mg/dL — AB (ref 6–23)
CHLORIDE: 106 meq/L (ref 96–112)
CO2: 26 mEq/L (ref 19–32)
Calcium: 9.3 mg/dL (ref 8.4–10.5)
Creatinine, Ser: 1.31 mg/dL — ABNORMAL HIGH (ref 0.40–1.20)
GFR: 41.38 mL/min — ABNORMAL LOW (ref >60.00)
GLUCOSE: 75 mg/dL (ref 70–99)
POTASSIUM: 4.8 meq/L (ref 3.5–5.1)
SODIUM: 140 meq/L (ref 135–145)
TOTAL PROTEIN: 7.4 g/dL (ref 6.0–8.3)
Total Bilirubin: 0.4 mg/dL (ref 0.2–1.2)

## 2017-09-04 LAB — TSH: TSH: 2.15 u[IU]/mL (ref 0.35–4.50)

## 2017-09-04 LAB — HEMOGLOBIN A1C: Hgb A1c MFr Bld: 6.5 % (ref 4.6–6.5)

## 2017-09-14 ENCOUNTER — Ambulatory Visit (INDEPENDENT_AMBULATORY_CARE_PROVIDER_SITE_OTHER): Payer: PPO | Admitting: Internal Medicine

## 2017-09-14 VITALS — BP 114/66 | HR 63 | Ht 60.0 in | Wt 103.0 lb

## 2017-09-14 DIAGNOSIS — Z950 Presence of cardiac pacemaker: Secondary | ICD-10-CM | POA: Diagnosis not present

## 2017-09-14 DIAGNOSIS — I6523 Occlusion and stenosis of bilateral carotid arteries: Secondary | ICD-10-CM | POA: Diagnosis not present

## 2017-09-14 DIAGNOSIS — R001 Bradycardia, unspecified: Secondary | ICD-10-CM | POA: Diagnosis not present

## 2017-09-14 LAB — CUP PACEART INCLINIC DEVICE CHECK
Battery Remaining Longevity: 91 mo
Battery Voltage: 2.78 V
Brady Statistic AP VP Percent: 0 %
Date Time Interrogation Session: 20190628161837
Implantable Lead Implant Date: 20130624
Implantable Lead Location: 753859
Implantable Lead Location: 753860
Implantable Lead Model: 1948
Implantable Pulse Generator Implant Date: 20130624
Lead Channel Impedance Value: 597 Ohm
Lead Channel Pacing Threshold Amplitude: 0.625 V
Lead Channel Pacing Threshold Amplitude: 0.625 V
Lead Channel Pacing Threshold Amplitude: 0.75 V
Lead Channel Pacing Threshold Amplitude: 0.75 V
Lead Channel Pacing Threshold Pulse Width: 0.4 ms
Lead Channel Pacing Threshold Pulse Width: 0.4 ms
Lead Channel Sensing Intrinsic Amplitude: 2 mV
Lead Channel Setting Pacing Amplitude: 2 V
Lead Channel Setting Pacing Amplitude: 2.5 V
Lead Channel Setting Pacing Pulse Width: 0.4 ms
MDC IDC LEAD IMPLANT DT: 20130624
MDC IDC MSMT BATTERY IMPEDANCE: 451 Ohm
MDC IDC MSMT LEADCHNL RA IMPEDANCE VALUE: 405 Ohm
MDC IDC MSMT LEADCHNL RA PACING THRESHOLD PULSEWIDTH: 0.4 ms
MDC IDC MSMT LEADCHNL RV PACING THRESHOLD PULSEWIDTH: 0.4 ms
MDC IDC MSMT LEADCHNL RV SENSING INTR AMPL: 15.67 mV
MDC IDC SET LEADCHNL RV SENSING SENSITIVITY: 5.6 mV
MDC IDC STAT BRADY AP VS PERCENT: 86 %
MDC IDC STAT BRADY AS VP PERCENT: 0 %
MDC IDC STAT BRADY AS VS PERCENT: 14 %

## 2017-09-14 NOTE — Patient Instructions (Signed)
Medication Instructions: Your physician recommends that you continue on your current medications as directed. Please refer to the Current Medication list given to you today.  Labwork: None Ordered  Procedures/Testing: None Ordered  Follow-Up: Your physician wants you to follow-up in: 6 MONTHS with [facility]. You will receive a reminder letter in the mail two months in advance. If you don't receive a letter, please call our office to schedule the follow-up appointment.   If you need a refill on your cardiac medications before your next appointment, please call your pharmacy.

## 2017-09-14 NOTE — Progress Notes (Signed)
Patient Care Team: [name], [name], MD as PCP - General (Family Medicine) Donato Heinz, MD as Consulting Physician (Nephrology) Noralyn Pick, MD as Referring Physician (Ophthalmology) Larey Dresser, MD as Consulting Physician (Cardiology)   HPI   Barbara Garner is a 83 y.o. female Seen to reestablish device followup   Last seen 2014 Device was originally implanted for complete heart block following bypass surgery and aortic valve replacement 2013.  CAD with prior CABGx3  And bioprosthetic AVR 2013 DATE TEST EF   5/15 Echo   60-65 % AVR ok          PMhx also notable for anemia transfusions required? Cause, CRI Date Hgb K  6/19 10.7 4.8        Severely demented   Ambulated in downstairs department Denies chest pain sob or edema, and son does not recall such complaints    Records and Results Reviewed [facility] records PCP note 6/19   Past Medical History:  Diagnosis Date  . Atrioventricular block, complete -intermittent   . CAD  S/P CABG x 3 08/2011    LIMA to diagonal branch, SVG to OM1, SVG to PDA, EVH via bilateral thighs  . Carotid artery disease ([place])    a. Carotid US 7/98 with 92% LICA stenosis;  b. Carotid US 11/16:  Bilateral ICA 40-59% >> FU 1 year // c. Carotid US 11/17: R 40-59; L 1-39 >> follow-up 1 year  . Chronic diastolic CHF (congestive heart failure) ([place])   . CKD (chronic kidney disease) stage 4, GFR 15-29 ml/min (HCC)    Cr 1.6 in 6/11, sees Dr. Arty Baumgartner  . Contact dermatitis and other eczema, due to unspecified cause   . Esophageal reflux   . Extrinsic asthma, unspecified    hasn't used inhaler in past year  . Headache(784.0)   . hypertension   . Hypothyroidism   . Idiopathic peripheral neuropathy   . Impaired vision    pt. reports that she identifies her meds by looking at the pillls, she is not able to read labels on bottles  . Iron deficiency anemia, unspecified   . LBBB (left bundle branch block)   .  Memory loss   . Pacemaker MDT dual chamber    DOI 08/2011  . Pure hypercholesterolemia   . Severe aortic stenosis -S/P AVR     19 mm [facility] Ease pericardial tissue valve  . Type II or unspecified type diabetes mellitus without mention of complication, not stated as uncontrolled     Past Surgical History:  Procedure Laterality Date  . ABDOMINAL HYSTERECTOMY  1977   partial; fibroids  . AORTIC VALVE REPLACEMENT  09/06/2011   Procedure: AORTIC VALVE REPLACEMENT (AVR);  Surgeon: Rexene Alberts, MD;  Location: [facility];  Service: Open Heart Surgery;  Laterality: N/A;  . bladder tack  1977  . CARDIAC CATHETERIZATION    . CARDIOVASCULAR STRESS TEST  10/2002   normal (per patient)  . carotid dopplers  2006  . CATARACT EXTRACTION, BILATERAL  2003  . COLONOSCOPY  04/1999   1 polyp  . COLONOSCOPY  06/2004   Neg. Int hem  . COLONOSCOPY  08/2013  . CORONARY ARTERY BYPASS GRAFT  09/06/2011   Procedure: CORONARY ARTERY BYPASS GRAFTING (CABG);  Surgeon: Rexene Alberts, MD;  Location: [facility];  Service: Open Heart Surgery;  Laterality: N/A;  . DEXA  10/2003 ,09/2005   osteoporosis,  Osteopenia  . ESOPHAGEAL DILATION  1997  .  ESOPHAGOGASTRODUODENOSCOPY  04/1999   HH; "watermelon stomach" (severe gastritis)  . ESOPHAGOGASTRODUODENOSCOPY  04/2002   Gastritis  . LEFT AND RIGHT HEART CATHETERIZATION WITH CORONARY ANGIOGRAM N/A 08/11/2011   Procedure: LEFT AND RIGHT HEART CATHETERIZATION WITH CORONARY ANGIOGRAM;  Surgeon: Larey Dresser, MD;  Location: [facility] CATH LAB;  Service: Cardiovascular;  Laterality: N/A;  . Opthy  11/1998;12/01;11/02  . PERMANENT PACEMAKER INSERTION N/A 2020-12-2811   Procedure: PERMANENT PACEMAKER INSERTION;  Surgeon: Deboraha Sprang, MD;  Location: [facility] CATH LAB;  Service: Cardiovascular;  Laterality: N/A;  . PTCA  [id number]   5 blockages  . REFRACTIVE SURGERY  2006    Current Meds  Medication Sig  . aspirin 81 MG EC tablet Take 1 tablet (81 mg total) by mouth daily.  Marland Kitchen atorvastatin  (LIPITOR) 40 MG tablet TAKE ONE TABLET BY MOUTH ONCE DAILY  . carvedilol (COREG) 25 MG tablet TAKE ONE-HALF TABLET BY MOUTH TWICE DAILY WITH MEALS  . Cholecalciferol (VITAMIN D-3) 1000 UNITS CAPS Take 1 capsule by mouth daily.  Marland Kitchen glipiZIDE (GLIPIZIDE XL) 5 MG 24 hr tablet Take 1 tablet (5 mg total) by mouth daily.  Marland Kitchen ipratropium-albuterol (DUONEB) 0.5-2.5 (3) MG/3ML SOLN Take 3 mLs by nebulization every 4 (four) hours as needed. Use every 4-6 hours as needed for cough and wheezing  . levothyroxine (SYNTHROID, LEVOTHROID) 25 MCG tablet TAKE 1 TABLET BY MOUTH ONCE DAILY BEFORE BREAKFAST  . [DISCONTINUED] Blood Glucose Monitoring Suppl (ONE TOUCH ULTRA MINI) W/DEVICE KIT USE AS DIRECTED TO TEST BLOOD GLUCOSE ONCE DAILY DX: 250.00  . [DISCONTINUED] glucose blood test strip Use as instructed to test blood glucose once daily Dx: E11.9  . [DISCONTINUED] polyethylene glycol powder (GLYCOLAX/MIRALAX) powder MIX ONE CAPFUL IN LIQUID AND DRINK ONCE DAILY (Patient taking differently: MIX ONE CAPFUL IN LIQUID AND DRINK AS NEEDED)    Allergies  Allergen Reactions  . Diflucan [Fluconazole] Rash    Renal Failure  . Keflex [Cephalexin]     rash  . Metformin     REACTION: intolerant  . Promethazine Hcl     REACTION: u/k   Social History   Tobacco Use  . Smoking status: Never Smoker  . Smokeless tobacco: Never Used  Substance Use Topics  . Alcohol use: No    Alcohol/week: 0.0 oz  . Drug use: No   Lives with son   Family History  Problem Relation Age of Onset  . Stroke Mother        died in her 85's  . Stroke Father        died in his 37's  . Diabetes Father   . Prostate cancer Son   . Diabetes Son        #2  . Hypertension Son        #3  . Thyroid disease Neg Hx   . Heart attack Neg Hx       Review of Systems negative except from HPI and PMH  Physical Exam BP 114/66   Pulse 63   Ht 5' (1.524 m)   Wt 103 lb (46.7 kg)   BMI 20.12 kg/m  Well developed and cachectic and palein  no acute distress HENT normal E scleral and icterus clear Neck Supple JVP flat; carotids brisk and full Clear to ausculation Device pocket well healed; without hematoma or erythema.  There is no tethering  Regular rate and rhythm, no murmurs gallops or rub Soft with active bowel sounds No clubbing cyanosis  Edema Alert and oriented, grossly normal  motor and sensory function Skin Warm and Dry   ECG Apacing with intrinsic conduction   Assessment and  Plan  Sinus node dysfunction  Complete heart block-- resolved   Pacemaker Medtronic  The patient's device was interrogated.  The information was reviewed. No changes were made in the programming.     CAD s/p CABG  AVR bioprosthesis  Dementia  Anemia     Without symptoms of ischemia  Euvolemic continue current meds  Hgb stable  Will see in 6 mon in device clinic   No V pacing     Current medicines are reviewed at length with the patient today .  The patient and family do not  have concerns regarding medicines.

## 2017-09-18 ENCOUNTER — Other Ambulatory Visit: Payer: Self-pay | Admitting: Family Medicine

## 2017-09-27 ENCOUNTER — Encounter (HOSPITAL_BASED_OUTPATIENT_CLINIC_OR_DEPARTMENT_OTHER): Payer: Self-pay | Admitting: Emergency Medicine

## 2017-09-27 ENCOUNTER — Other Ambulatory Visit: Payer: Self-pay

## 2017-09-27 ENCOUNTER — Emergency Department (HOSPITAL_BASED_OUTPATIENT_CLINIC_OR_DEPARTMENT_OTHER): Payer: PPO

## 2017-09-27 ENCOUNTER — Emergency Department (HOSPITAL_BASED_OUTPATIENT_CLINIC_OR_DEPARTMENT_OTHER)
Admission: EM | Admit: 2017-09-27 | Discharge: 2017-09-27 | Disposition: A | Discharge status: Home / Self Care | Payer: PPO | Attending: Emergency Medicine | Admitting: Emergency Medicine

## 2017-09-27 DIAGNOSIS — Z79899 Other long term (current) drug therapy: Secondary | ICD-10-CM | POA: Diagnosis not present

## 2017-09-27 DIAGNOSIS — I130 Hypertensive heart and chronic kidney disease with heart failure and stage 1 through stage 4 chronic kidney disease, or unspecified chronic kidney disease: Secondary | ICD-10-CM | POA: Insufficient documentation

## 2017-09-27 DIAGNOSIS — N184 Chronic kidney disease, stage 4 (severe): Secondary | ICD-10-CM | POA: Diagnosis not present

## 2017-09-27 DIAGNOSIS — K5900 Constipation, unspecified: Secondary | ICD-10-CM | POA: Diagnosis not present

## 2017-09-27 DIAGNOSIS — Z950 Presence of cardiac pacemaker: Secondary | ICD-10-CM | POA: Insufficient documentation

## 2017-09-27 DIAGNOSIS — Z7982 Long term (current) use of aspirin: Secondary | ICD-10-CM | POA: Diagnosis not present

## 2017-09-27 DIAGNOSIS — E039 Hypothyroidism, unspecified: Secondary | ICD-10-CM | POA: Diagnosis not present

## 2017-09-27 DIAGNOSIS — E119 Type 2 diabetes mellitus without complications: Secondary | ICD-10-CM | POA: Insufficient documentation

## 2017-09-27 DIAGNOSIS — I5032 Chronic diastolic (congestive) heart failure: Secondary | ICD-10-CM | POA: Diagnosis not present

## 2017-09-27 DIAGNOSIS — K449 Diaphragmatic hernia without obstruction or gangrene: Secondary | ICD-10-CM | POA: Diagnosis not present

## 2017-09-27 MED ORDER — MAGNESIUM CITRATE PO SOLN
0.5000 | Freq: Once | ORAL | Status: AC
  Administered 2017-09-27: 0.5 via ORAL
  Filled 2017-09-27: qty 296

## 2017-09-27 MED ORDER — BISACODYL 10 MG RE SUPP
10.0000 mg | Freq: Once | RECTAL | Status: AC
  Administered 2017-09-27: 10 mg via RECTAL
  Filled 2017-09-27: qty 1

## 2017-09-27 MED ORDER — POLYETHYLENE GLYCOL 3350 17 GM/SCOOP PO POWD: 17.0000 g | Freq: Every day | ORAL | 0 refills | Status: AC

## 2017-09-27 NOTE — ED Provider Notes (Signed)
[facility] EMERGENCY DEPARTMENT Provider Note   CSN: [id number] Arrival date & time: 09/27/17  0117     History   Chief Complaint Chief Complaint  Patient presents with  . Constipation    HPI Barbara Garner is a 83 y.o. female.  The history is provided by a relative. The history is limited by the condition of the patient (level 5 dementia).  Constipation   This is a new problem. The current episode started 2 days ago. The stool is described as firm. Pertinent negatives include no abdominal pain, no flatus and no dysuria. There is no fiber in the patient's diet. She does not exercise regularly. There has not been adequate water intake. Treatments tried: miralax today. The treatment provided no relief. Risk factors: dementia. Her past medical history is significant for neurologic disease.  Patient is in no distress.  Family brought patient in because when they take her to the bathroom she has not stooled that they have noticed. Had one dose of miralax earlier and nothing helped.  No f/c/r.  Patient has not complained of anything.  Family did not know miralax would not work within hours.    Past Medical History:  Diagnosis Date  . Atrioventricular block, complete -intermittent   . CAD  S/P CABG x 3 08/2011    LIMA to diagonal branch, SVG to OM1, SVG to PDA, EVH via bilateral thighs  . Carotid artery disease ([place])    a. Carotid US 1/02 with 58% LICA stenosis;  b. Carotid US 11/16:  Bilateral ICA 40-59% >> FU 1 year // c. Carotid US 11/17: R 40-59; L 1-39 >> follow-up 1 year  . Chronic diastolic CHF (congestive heart failure) ([place])   . CKD (chronic kidney disease) stage 4, GFR 15-29 ml/min (HCC)    Cr 1.6 in 6/11, sees Dr. Arty Baumgartner  . Contact dermatitis and other eczema, due to unspecified cause   . Esophageal reflux   . Extrinsic asthma, unspecified    hasn't used inhaler in past year  . Headache(784.0)   . hypertension   . Hypothyroidism   . Idiopathic peripheral  neuropathy   . Impaired vision    pt. reports that she identifies her meds by looking at the pillls, she is not able to read labels on bottles  . Iron deficiency anemia, unspecified   . LBBB (left bundle branch block)   . Memory loss   . Pacemaker MDT dual chamber    DOI 08/2011  . Pure hypercholesterolemia   . Severe aortic stenosis -S/P AVR     19 mm [facility] Ease pericardial tissue valve  . Type II or unspecified type diabetes mellitus without mention of complication, not stated as uncontrolled     Patient Active Problem List   Diagnosis Date Noted  . Thyroid activity decreased 05/14/2015  . Hyperkalemia 05/25/2014  . CN (constipation) 05/08/2014  . Diabetes mellitus type 2, controlled ([place]) 03/11/2014  . Dementia 12/17/2013  . Memory loss, short term 10/29/2013  . Low back pain 09/24/2013  . Sinus bradycardia 09/24/2012  . Vitamin D deficiency disease 08/23/2012  . Lichen sclerosus et atrophicus of the vulva 04/05/2012  . Chronic diastolic CHF (congestive heart failure) ([place]) 11/15/2011  . Pacemaker-Medtronic 09/13/2011  . Atrial fibrillation ([place]) 09/12/2011  . Complete heart block-intermittent 09/09/2011  . S/P aortic valve replacement 09/06/2011  . S/P CABG x 3 09/06/2011  . IVCD (intraventricular conduction defect)   . CKD (chronic kidney disease) stage 4, GFR 15-29  ml/min ([place])   . Carotid stenosis 05/30/2011  . Squamous cell carcinoma of skin 07/21/2010  . HYPERCHOLESTEROLEMIA, PURE 09/20/2006  . ANEMIA-IRON DEFICIENCY 06/27/2006  . PERIPHERAL NEUROPATHY 06/27/2006  . Essential hypertension 06/27/2006  . MYOCARDIAL INFARCTION, HX OF 06/27/2006  . CORONARY ARTERY DISEASE 06/27/2006  . ASTHMA 06/27/2006  . ECZEMA 06/27/2006    Past Surgical History:  Procedure Laterality Date  . ABDOMINAL HYSTERECTOMY  1977   partial; fibroids  . AORTIC VALVE REPLACEMENT  09/06/2011   Procedure: AORTIC VALVE REPLACEMENT (AVR);  Surgeon: Rexene Alberts, MD;  Location: [facility];  Service: Open Heart Surgery;  Laterality: N/A;  . bladder tack  1977  . CARDIAC CATHETERIZATION    . CARDIOVASCULAR STRESS TEST  10/2002   normal (per patient)  . carotid dopplers  2006  . CATARACT EXTRACTION, BILATERAL  2003  . COLONOSCOPY  04/1999   1 polyp  . COLONOSCOPY  06/2004   Neg. Int hem  . COLONOSCOPY  08/2013  . CORONARY ARTERY BYPASS GRAFT  09/06/2011   Procedure: CORONARY ARTERY BYPASS GRAFTING (CABG);  Surgeon: Rexene Alberts, MD;  Location: [facility];  Service: Open Heart Surgery;  Laterality: N/A;  . DEXA  10/2003 ,09/2005   osteoporosis,  Osteopenia  . ESOPHAGEAL DILATION  1997  . ESOPHAGOGASTRODUODENOSCOPY  04/1999   HH; "watermelon stomach" (severe gastritis)  . ESOPHAGOGASTRODUODENOSCOPY  04/2002   Gastritis  . LEFT AND RIGHT HEART CATHETERIZATION WITH CORONARY ANGIOGRAM N/A 08/11/2011   Procedure: LEFT AND RIGHT HEART CATHETERIZATION WITH CORONARY ANGIOGRAM;  Surgeon: Larey Dresser, MD;  Location: [facility] CATH LAB;  Service: Cardiovascular;  Laterality: N/A;  . Opthy  11/1998;12/01;11/02  . PERMANENT PACEMAKER INSERTION N/A 10/05/202013   Procedure: PERMANENT PACEMAKER INSERTION;  Surgeon: Deboraha Sprang, MD;  Location: [facility] CATH LAB;  Service: Cardiovascular;  Laterality: N/A;  . PTCA  [id number]   5 blockages  . REFRACTIVE SURGERY  2006     OB History   None      Home Medications    Prior to Admission medications   Medication Sig Start Date End Date Taking? Authorizing Provider  aspirin 81 MG EC tablet Take 1 tablet (81 mg total) by mouth daily. 11/14/13   [name], PA-C  atorvastatin (LIPITOR) 40 MG tablet TAKE ONE TABLET BY MOUTH ONCE DAILY 03/08/17   [name], [name], MD  carvedilol (COREG) 25 MG tablet TAKE ONE-HALF TABLET BY MOUTH TWICE DAILY WITH MEALS 08/20/17   [name], [name], MD  Cholecalciferol (VITAMIN D-3) 1000 UNITS CAPS Take 1 capsule by mouth daily.    [provider]  glipiZIDE (GLIPIZIDE XL) 5 MG 24 hr tablet Take 1 tablet (5 mg  total) by mouth daily. 04/02/17   [name], [name], MD  ipratropium-albuterol (DUONEB) 0.5-2.5 (3) MG/3ML SOLN Take 3 mLs by nebulization every 4 (four) hours as needed. Use every 4-6 hours as needed for cough and wheezing 04/12/16   [name], [name], MD  levothyroxine (SYNTHROID, LEVOTHROID) 25 MCG tablet TAKE 1 TABLET BY MOUTH ONCE DAILY BEFORE BREAKFAST 08/20/17   [name], [name], MD    Family History Family History  Problem Relation Age of Onset  . Stroke Mother        died in her 36's  . Stroke Father        died in his 23's  . Diabetes Father   . Prostate cancer Son   . Diabetes Son        #2  .  Hypertension Son        #3  . Thyroid disease Neg Hx   . Heart attack Neg Hx     Social History Social History   Tobacco Use  . Smoking status: Never Smoker  . Smokeless tobacco: Never Used  Substance Use Topics  . Alcohol use: No    Alcohol/week: 0.0 oz  . Drug use: No     Allergies   Diflucan [fluconazole]; Keflex [cephalexin]; Metformin; and Promethazine hcl   Review of Systems Review of Systems  Constitutional: Negative for fever.  HENT: Negative for congestion.   Respiratory: Negative for shortness of breath.   Cardiovascular: Negative for chest pain.  Gastrointestinal: Positive for constipation. Negative for abdominal pain, diarrhea, flatus, nausea, rectal pain and vomiting.  Genitourinary: Negative for dysuria.  All other systems reviewed and are negative.    Physical Exam Updated Vital Signs BP (!) 179/79   Pulse 80   Temp 97.7 F (36.5 C) (Oral)   Resp 16   Ht 5' (1.524 m)   Wt 46.7 kg (103 lb)   SpO2 96%   BMI 20.12 kg/m   Physical Exam  Constitutional: She appears well-developed and well-nourished. No distress.  HENT:  Head: Normocephalic and atraumatic.  Mouth/Throat: No oropharyngeal exudate.  Eyes: Pupils are equal, round, and reactive to light. Conjunctivae are normal.  Neck: Normal range of motion. Neck supple.  Cardiovascular:  Normal rate, regular rhythm, normal heart sounds and intact distal pulses.  Pulmonary/Chest: Effort normal and breath sounds normal. No stridor. She has no wheezes. She has no rales.  Abdominal: Soft. Bowel sounds are normal. She exhibits no mass. There is no tenderness. There is no rebound and no guarding. No hernia.  Musculoskeletal: Normal range of motion.  Neurological: She is alert. She displays normal reflexes.  Skin: Skin is warm and dry. Capillary refill takes less than 2 seconds.  Psychiatric: She has a normal mood and affect.     ED Treatments / Results   Procedures Procedures (including critical care time)  Medications Ordered in ED Medications  bisacodyl (DULCOLAX) suppository 10 mg (has no administration in time range)  magnesium citrate solution 0.5 Bottle (has no administration in time range)       Final Clinical Impressions(s) / ED Diagnoses   Return for pain, numbness, changes in vision or speech, fevers >100.4 unrelieved by medication, shortness of breath, intractable vomiting, or diarrhea, abdominal pain, Inability to tolerate liquids or food, cough, altered mental status or any concerns. No signs of systemic illness or infection. The patient is nontoxic-appearing on exam and vital signs are within normal limits. Will refer to urology for microscopy hematuria as patient is asymptomatic.  I have reviewed the triage vital signs and the nursing notes. Pertinent labs &imaging results that were available during my care of the patient were reviewed by me and considered in my medical decision making (see chart for details).  After history, exam, and medical workup I feel the patient has been appropriately medically screened and is safe for discharge home. Pertinent diagnoses were discussed with the patient. Patient was given return precautions.    Jeana Kersting, MD 09/27/17 [phone]

## 2017-09-27 NOTE — ED Notes (Signed)
Pt's son verbalizes understanding of dc instructions and denies any further needs at this time

## 2017-09-27 NOTE — ED Triage Notes (Signed)
Pt c/o constipation. Last BM unknown.

## 2017-11-24 ENCOUNTER — Other Ambulatory Visit: Payer: Self-pay | Admitting: Family Medicine

## 2017-11-24 DIAGNOSIS — E034 Atrophy of thyroid (acquired): Secondary | ICD-10-CM

## 2017-11-26 MED ORDER — LEVOTHYROXINE SODIUM 25 MCG PO TABS: 25.0000 ug | ORAL_TABLET | Freq: Every day | ORAL | 1 refills | Status: AC

## 2017-11-26 NOTE — Addendum Note (Signed)
Addended by: Wynonia Musty A on: 11/26/2017 03:32 PM   Modules accepted: Orders

## 2017-12-17 ENCOUNTER — Encounter: Payer: PPO | Admitting: *Deleted

## 2017-12-17 ENCOUNTER — Encounter: Payer: Self-pay | Admitting: Physician Assistant

## 2017-12-17 ENCOUNTER — Telehealth: Payer: Self-pay | Admitting: Cardiology

## 2017-12-17 ENCOUNTER — Ambulatory Visit (INDEPENDENT_AMBULATORY_CARE_PROVIDER_SITE_OTHER): Payer: PPO | Admitting: Physician Assistant

## 2017-12-17 VITALS — BP 132/64 | HR 68 | Ht 60.0 in | Wt 93.0 lb

## 2017-12-17 DIAGNOSIS — E785 Hyperlipidemia, unspecified: Secondary | ICD-10-CM

## 2017-12-17 DIAGNOSIS — I10 Essential (primary) hypertension: Secondary | ICD-10-CM

## 2017-12-17 DIAGNOSIS — I779 Disorder of arteries and arterioles, unspecified: Secondary | ICD-10-CM | POA: Diagnosis not present

## 2017-12-17 DIAGNOSIS — I2510 Atherosclerotic heart disease of native coronary artery without angina pectoris: Secondary | ICD-10-CM

## 2017-12-17 DIAGNOSIS — I739 Peripheral vascular disease, unspecified: Secondary | ICD-10-CM

## 2017-12-17 DIAGNOSIS — Z950 Presence of cardiac pacemaker: Secondary | ICD-10-CM | POA: Diagnosis not present

## 2017-12-17 DIAGNOSIS — Z952 Presence of prosthetic heart valve: Secondary | ICD-10-CM

## 2017-12-17 NOTE — Patient Instructions (Addendum)
Medication Instructions:   .Your physician recommends that you continue on your current medications as directed. Please refer to the Current Medication list given to you today.   If you need a refill on your cardiac medications before your next appointment, please call your pharmacy.  Labwork: NONE ORDERED  TODAY    Testing/Procedures:  IN 6 MONTHS WITH  FOLLOW UP APPT Your physician has requested that you have an echocardiogram. Echocardiography is a painless test that uses sound waves to create images of your heart. It provides your doctor with information about the size and shape of your heart and how well your heart's chambers and valves are working. This procedure takes approximately one hour. There are no restrictions for this procedure.  WITH IN NEXT FEW MONTHS. Your physician has requested that you have a carotid duplex. This test is an ultrasound of the carotid arteries in your neck. It looks at blood flow through these arteries that supply the brain with blood. Allow one hour for this exam. There are no restrictions or special instructions.   Follow-Up: Your physician wants you to follow-up in:  IN  [place] will receive a reminder letter in the mail two months in advance. If you don't receive a letter, please call our office to schedule the follow-up appointment.      Any Other Special Instructions Will Be Listed Below (If Applicable).

## 2017-12-17 NOTE — Progress Notes (Signed)
Cardiology Office Note:    Date:  12/17/2017   ID:  LARANDA BURKEMPER, DOB 1934-07-10, MRN [id number]  PCP:  Darreld Mclean, MD  Cardiologist:  Mertie Moores, MD / Richardson Dopp, PA-C   Electrophysiologist:  Virl Axe, MD   Referring MD: Darreld Mclean, MD   Chief Complaint  Patient presents with  . Follow-up    CAD, Hx of AVR     History of Present Illness:    Barbara Garner is a 83 y.o. female with coronary artery disease and aortic stenosis s/p coronary artery bypass grafting and bioprosthetic aortic valve replacement in 2013, complete heart block s/p pacemaker, diastolic congestive heart failure, left bundle branch block, hypertension, chronic kidney disease and dementia. She was last seen in our office by Dr. Caryl Comes in 08/2017.    Ms. Kassa returns for follow up.  She is here with her son.  She lives with son.  She has not had any chest pain or significant shortness of breath. She denies orthopnea, leg swelling, syncope.  She has not fallen but is afraid she may fall.   Prior CV studies:   The following studies were reviewed today:  Carotid US 02/04/16 R 40-59; L 1-39 FU 1 year  Echo (5/15):  EF 60-65%, Gr 2 DD, AVR ok (mean 9 mmHg), MAC, mild MS (mean 6 mmHg), mild to mod MR, mod LAE  Carotid US (6/15):  Bilateral ICA 40-59%  Past Medical History:  Diagnosis Date  . Atrioventricular block, complete -intermittent   . CAD  S/P CABG x 3 08/2011    LIMA to diagonal branch, SVG to OM1, SVG to PDA, EVH via bilateral thighs  . Carotid artery disease ([place])    a. Carotid US 6/60 with 63% LICA stenosis;  b. Carotid US 11/16:  Bilateral ICA 40-59% >> FU 1 year // c. Carotid US 11/17: R 40-59; L 1-39 >> follow-up 1 year  . Chronic diastolic CHF (congestive heart failure) ([place])   . CKD (chronic kidney disease) stage 4, GFR 15-29 ml/min (HCC)    Cr 1.6 in 6/11, sees Dr. Arty Baumgartner  . Contact dermatitis and other eczema, due to unspecified cause   . Esophageal reflux   .  Extrinsic asthma, unspecified    hasn't used inhaler in past year  . Headache(784.0)   . hypertension   . Hypothyroidism   . Idiopathic peripheral neuropathy   . Impaired vision    pt. reports that she identifies her meds by looking at the pillls, she is not able to read labels on bottles  . Iron deficiency anemia, unspecified   . LBBB (left bundle branch block)   . Memory loss   . Pacemaker MDT dual chamber    DOI 08/2011  . Pure hypercholesterolemia   . Severe aortic stenosis -S/P AVR     19 mm [facility] Ease pericardial tissue valve  . Type II or unspecified type diabetes mellitus without mention of complication, not stated as uncontrolled   1. Anemia-iron deficiency  2. Asthma  3. Coronary artery disease: Unstable angina in 1997 with PTCA of large D1. Myoview (9/10) at [facility] cardiology showed EF 84%, stable perfusion images with no ischemia. LHC (5/13) showed 95% OM1 stenosis, 75% mLAD, 80% ostial large D1, 75% dRCA. She had LIMA-LAD, SVG-PDA, and SVG-OM at time of AVR in 6/13.  4. Diabetes mellitus, type II  5. GERD  6. Hypertension  7. Hypothyroidism  8. Pneumonia, hx of  9. CKD: creatinine 1.6  in 6/11  10. Chronic LBBB  11. Aortic stenosis: Severe. Echo (3/13): EF 55-65%, moderately LV hypertrophy, severe AS with mean gradient 40 mmHg/peak gradient 70 mmHg, PA systolic pressure 35 mmHg. Patient had bioprosthetic AVR in 6/13. Echo post-op in 6/13 showed mild LVH, EF 65-70%, mean gradient 14 mmHg across bioprosthetic aortic valve.  12. Carotid stenosis: Carotid dopplers 2/77 with 82% LICA stenosis. Carotid dopplers (3/13) with 40-59% bilateral carotid stenosis.  13. Post-op complete heart block in 6/13 with Medtronic dual chamber PPM.  Surgical Hx: The patient  has a past surgical history that includes Mitral valve replacement ([id number]); Cardiac catheterization; Esophageal dilation (1997); Colonoscopy (04/1999); Esophagogastroduodenoscopy (04/1999); DEXA (10/2003  ,09/2005); Cataract extraction, bilateral (2003); Esophagogastroduodenoscopy (04/2002); Cardiovascular stress test (10/2002); Abdominal hysterectomy (1977); bladder tack (1977); Colonoscopy (06/2004); carotid dopplers (2006); Refractive surgery (2006); Opthy (11/1998;12/01;11/02); Aortic valve replacement (09/06/2011); Coronary artery bypass graft (09/06/2011); Colonoscopy (08/2013); left and right heart catheterization with coronary angiogram (N/A, 08/11/2011); and permanent pacemaker insertion (N/A, 09-08-202013).   Current Medications: Current Meds  Medication Sig  . aspirin 81 MG EC tablet Take 1 tablet (81 mg total) by mouth daily.  Marland Kitchen atorvastatin (LIPITOR) 40 MG tablet TAKE ONE TABLET BY MOUTH ONCE DAILY  . carvedilol (COREG) 25 MG tablet TAKE ONE-HALF TABLET BY MOUTH TWICE DAILY WITH MEALS  . Cholecalciferol (VITAMIN D-3) 1000 UNITS CAPS Take 1 capsule by mouth daily.  Marland Kitchen ipratropium-albuterol (DUONEB) 0.5-2.5 (3) MG/3ML SOLN Take 3 mLs by nebulization every 4 (four) hours as needed. Use every 4-6 hours as needed for cough and wheezing  . levothyroxine (SYNTHROID, LEVOTHROID) 25 MCG tablet Take 1 tablet (25 mcg total) by mouth daily before breakfast.  . polyethylene glycol powder (MIRALAX) powder Take 17 g by mouth daily.     Allergies:   Diflucan [fluconazole]; Keflex [cephalexin]; Metformin; and Promethazine hcl   Social History   Tobacco Use  . Smoking status: Never Smoker  . Smokeless tobacco: Never Used  Substance Use Topics  . Alcohol use: No    Alcohol/week: 0.0 standard drinks  . Drug use: No     Family Hx: The patient's family history includes Diabetes in her father and son; Hypertension in her son; Prostate cancer in her son; Stroke in her father and mother. There is no history of Thyroid disease or Heart attack.  ROS:   Please see the history of present illness.    ROS All other systems reviewed and are negative.   EKGs/Labs/Other Test Reviewed:    EKG:  EKG is not ordered  today.    Recent Labs: 09/03/2017: ALT 10; BUN 51; Creatinine, Ser 1.31; Hemoglobin 10.7; Platelets 186.0; Potassium 4.8; Sodium 140; TSH 2.15   Recent Lipid Panel Lab Results  Component Value Date/Time   CHOL 113 09/03/2017 02:12 PM   TRIG 75.0 09/03/2017 02:12 PM   HDL 53.40 09/03/2017 02:12 PM   CHOLHDL 2 09/03/2017 02:12 PM   LDLCALC 44 09/03/2017 02:12 PM    Physical Exam:    VS:  BP 132/64   Pulse 68   Ht 5' (1.524 m)   Wt 93 lb (42.2 kg)   SpO2 97%   BMI 18.16 kg/m     Wt Readings from Last 3 Encounters:  12/17/17 93 lb (42.2 kg)  09/27/17 103 lb (46.7 kg)  09/14/17 103 lb (46.7 kg)     Physical Exam  Constitutional: She is oriented to person, place, and time. She appears well-developed and well-nourished. No distress.  HENT:  Head: Normocephalic  and atraumatic.  Eyes: No scleral icterus.  Neck: No thyromegaly present.  Cardiovascular: Normal rate and regular rhythm.  No murmur heard. Pulmonary/Chest: Effort normal. She has no rales.  Abdominal: Soft.  Musculoskeletal: She exhibits no edema.  Lymphadenopathy:    She has no cervical adenopathy.  Neurological: She is alert and oriented to person, place, and time.  Skin: Skin is warm and dry.  Psychiatric: She has a normal mood and affect.    ASSESSMENT & PLAN:    Coronary artery disease involving native coronary artery of native heart without angina pectoris S/p CABG in 2013.  She denies symptoms of angina.  She is somewhat sedentary.  Continue ASA, statin, beta-blocker.  She was previously followed by Dr. Aundra Dubin. I will have her follow up for general cardiology with me and Dr. Acie Fredrickson.    S/P aortic valve replacement Continue SBE prophylaxis.  Echo in 2015 with stable gradients.  Arrange follow up echocardiogram.    Bilateral carotid artery disease, unspecified type (HCC) Continue ASA, statin.  Arrange follow up carotid US.  Essential hypertension The patient's blood pressure is controlled on her  current regimen.  Continue current therapy.    Hyperlipidemia, unspecified hyperlipidemia type LDL optimal on most recent lab work.  Continue current Rx.    Pacemaker Follow up with EP as planned.    Dispo:  Return in about 6 months (around 06/17/2018) for Routine Follow Up, w/ Richardson Dopp, PA-C.   Medication Adjustments/Labs and Tests Ordered: Current medicines are reviewed at length with the patient today.  Concerns regarding medicines are outlined above.  Tests Ordered: Orders Placed This Encounter  Procedures  . ECHOCARDIOGRAM COMPLETE   Medication Changes: No orders of the defined types were placed in this encounter.   Signed, Richardson Dopp, PA-C  12/17/2017 4:21 PM    [facility] [address], [place], [place]  [address] Phone: [phone]; Fax: [phone]

## 2017-12-17 NOTE — Telephone Encounter (Signed)
LMOVM reminding pt to send remote transmission.   

## 2017-12-20 ENCOUNTER — Encounter: Payer: Self-pay | Admitting: Cardiology

## 2017-12-20 NOTE — Progress Notes (Signed)
Letter  

## 2018-01-22 ENCOUNTER — Ambulatory Visit (HOSPITAL_COMMUNITY)
Admission: RE | Admit: 2018-01-22 | Discharge: 2018-01-22 | Disposition: A | Discharge status: Home / Self Care | Payer: PPO | Source: Ambulatory Visit | Attending: Internal Medicine | Admitting: Internal Medicine

## 2018-01-22 ENCOUNTER — Other Ambulatory Visit: Payer: Self-pay | Admitting: Physician Assistant

## 2018-01-22 DIAGNOSIS — I2510 Atherosclerotic heart disease of native coronary artery without angina pectoris: Secondary | ICD-10-CM

## 2018-01-22 DIAGNOSIS — I6523 Occlusion and stenosis of bilateral carotid arteries: Secondary | ICD-10-CM

## 2018-01-24 ENCOUNTER — Telehealth: Payer: Self-pay

## 2018-01-24 ENCOUNTER — Encounter: Payer: Self-pay | Admitting: Physician Assistant

## 2018-01-24 DIAGNOSIS — I2510 Atherosclerotic heart disease of native coronary artery without angina pectoris: Secondary | ICD-10-CM

## 2018-01-24 NOTE — Telephone Encounter (Signed)
Reviewed notes with son [name] (DPR on File) who verbalized understanding. Per Nicki Reaper  recommendation to repeat carotid ultrasound in 1 year. Orders have been placed in Epic.

## 2018-02-25 ENCOUNTER — Other Ambulatory Visit: Payer: Self-pay | Admitting: Family Medicine

## 2018-02-25 DIAGNOSIS — E785 Hyperlipidemia, unspecified: Secondary | ICD-10-CM

## 2018-02-25 DIAGNOSIS — I10 Essential (primary) hypertension: Secondary | ICD-10-CM

## 2018-04-03 ENCOUNTER — Ambulatory Visit: Payer: PPO | Admitting: *Deleted

## 2018-05-29 NOTE — Progress Notes (Deleted)
Subjective:   Barbara Garner is a 84 y.o. female who presents for Medicare Annual (Subsequent) preventive examination.  Review of Systems: No ROS.  Medicare Wellness Visit. Additional risk factors are reflected in the social history.   Sleep patterns:  Home Safety/Smoke Alarms: Feels safe in home. Smoke alarms in place.  Lives w/ son [name] and his wife.    Female:   Mammo-  Pt and son decline.     Dexa scan- Pt and son decline CCS- No longer doing routine screening due to age.      Objective:     Vitals: There were no vitals taken for this visit.  There is no height or weight on file to calculate BMI.  Advanced Directives 09/27/2017 04/02/2017 03/01/2016 11/29/2015 02/24/2015 08/27/2014 03/11/2014  Does Patient Have a Medical Advance Directive? Yes No Yes No No No Yes  Type of Advance Directive - - - - - - (No Data)  Does patient want to make changes to medical advance directive? - - Yes (MAU/Ambulatory/Procedural Areas - Information given) - - - -  Would patient like information on creating a medical advance directive? - No - Patient declined - No - patient declined information No - patient declined information No - patient declined information No - patient declined information  Pre-existing out of facility DNR order (yellow form or pink MOST form) - - - - - - -    Tobacco Social History   Tobacco Use  Smoking Status Never Smoker  Smokeless Tobacco Never Used     Counseling given: Not Answered   Clinical Intake:                       Past Medical History:  Diagnosis Date  . Atrioventricular block, complete -intermittent   . CAD  S/P CABG x 3 08/2011    LIMA to diagonal branch, SVG to OM1, SVG to PDA, EVH via bilateral thighs  . Carotid artery disease ([place])    a. Carotid US 7/82 with 95% LICA stenosis;  b. Carotid US 11/16:  Bilateral ICA 40-59% >> FU 1 year // c. Carotid US 11/17: R 40-59; L 1-39 // Carotid US 11/19: R 40-59; L 1-39 >> repeat 1 year  .  Chronic diastolic CHF (congestive heart failure) ([place])   . CKD (chronic kidney disease) stage 4, GFR 15-29 ml/min (HCC)    Cr 1.6 in 6/11, sees Dr. Arty Baumgartner  . Contact dermatitis and other eczema, due to unspecified cause   . Esophageal reflux   . Extrinsic asthma, unspecified    hasn't used inhaler in past year  . Headache(784.0)   . hypertension   . Hypothyroidism   . Idiopathic peripheral neuropathy   . Impaired vision    pt. reports that she identifies her meds by looking at the pillls, she is not able to read labels on bottles  . Iron deficiency anemia, unspecified   . LBBB (left bundle branch block)   . Memory loss   . Pacemaker MDT dual chamber    DOI 08/2011  . Pure hypercholesterolemia   . Severe aortic stenosis -S/P AVR     19 mm [facility] Ease pericardial tissue valve  . Type II or unspecified type diabetes mellitus without mention of complication, not stated as uncontrolled    Past Surgical History:  Procedure Laterality Date  . ABDOMINAL HYSTERECTOMY  1977   partial; fibroids  . AORTIC VALVE REPLACEMENT  09/06/2011   Procedure: AORTIC  VALVE REPLACEMENT (AVR);  Surgeon: Rexene Alberts, MD;  Location: [place];  Service: Open Heart Surgery;  Laterality: N/A;  . bladder tack  1977  . CARDIAC CATHETERIZATION    . CARDIOVASCULAR STRESS TEST  10/2002   normal (per patient)  . carotid dopplers  2006  . CATARACT EXTRACTION, BILATERAL  2003  . COLONOSCOPY  04/1999   1 polyp  . COLONOSCOPY  06/2004   Neg. Int hem  . COLONOSCOPY  08/2013  . CORONARY ARTERY BYPASS GRAFT  09/06/2011   Procedure: CORONARY ARTERY BYPASS GRAFTING (CABG);  Surgeon: Rexene Alberts, MD;  Location: [place];  Service: Open Heart Surgery;  Laterality: N/A;  . DEXA  10/2003 ,09/2005   osteoporosis,  Osteopenia  . ESOPHAGEAL DILATION  1997  . ESOPHAGOGASTRODUODENOSCOPY  04/1999   HH; "watermelon stomach" (severe gastritis)  . ESOPHAGOGASTRODUODENOSCOPY  04/2002   Gastritis  . LEFT AND RIGHT HEART  CATHETERIZATION WITH CORONARY ANGIOGRAM N/A 08/11/2011   Procedure: LEFT AND RIGHT HEART CATHETERIZATION WITH CORONARY ANGIOGRAM;  Surgeon: Larey Dresser, MD;  Location: [facility] CATH LAB;  Service: Cardiovascular;  Laterality: N/A;  . Opthy  11/1998;12/01;11/02  . PERMANENT PACEMAKER INSERTION N/A February 20, 202013   Procedure: PERMANENT PACEMAKER INSERTION;  Surgeon: Deboraha Sprang, MD;  Location: [facility] CATH LAB;  Service: Cardiovascular;  Laterality: N/A;  . PTCA  [id number]   5 blockages  . REFRACTIVE SURGERY  2006   Family History  Problem Relation Age of Onset  . Stroke Mother        died in her 4's  . Stroke Father        died in his 23's  . Diabetes Father   . Prostate cancer Son   . Diabetes Son        #2  . Hypertension Son        #3  . Thyroid disease Neg Hx   . Heart attack Neg Hx    Social History   Socioeconomic History  . Marital status: Widowed    Spouse name: Not on file  . Number of children: 3  . Years of education: Not on file  . Highest education level: Not on file  Occupational History  . Occupation: Retired    Fish farm manager: RETIRED  Social Needs  . Financial resource strain: Not on file  . Food insecurity:    Worry: Not on file    Inability: Not on file  . Transportation needs:    Medical: Not on file    Non-medical: Not on file  Tobacco Use  . Smoking status: Never Smoker  . Smokeless tobacco: Never Used  Substance and Sexual Activity  . Alcohol use: No    Alcohol/week: 0.0 standard drinks  . Drug use: No  . Sexual activity: Never  Lifestyle  . Physical activity:    Days per week: Not on file    Minutes per session: Not on file  . Stress: Not on file  Relationships  . Social connections:    Talks on phone: Not on file    Gets together: Not on file    Attends religious service: Not on file    Active member of club or organization: Not on file    Attends meetings of clubs or organizations: Not on file    Relationship status: Not on file  Other Topics  Concern  . Not on file  Social History Narrative   Widowed.  Lives with son in [place].  Outpatient Encounter Medications as of 05/30/2018  Medication Sig  . aspirin 81 MG EC tablet Take 1 tablet (81 mg total) by mouth daily.  Marland Kitchen atorvastatin (LIPITOR) 40 MG tablet TAKE 1 TABLET BY MOUTH ONCE DAILY  . carvedilol (COREG) 25 MG tablet TAKE 1/2 (ONE-HALF) TABLET BY MOUTH TWICE DAILY WITH MEALS  . Cholecalciferol (VITAMIN D-3) 1000 UNITS CAPS Take 1 capsule by mouth daily.  Marland Kitchen ipratropium-albuterol (DUONEB) 0.5-2.5 (3) MG/3ML SOLN Take 3 mLs by nebulization every 4 (four) hours as needed. Use every 4-6 hours as needed for cough and wheezing  . levothyroxine (SYNTHROID, LEVOTHROID) 25 MCG tablet Take 1 tablet (25 mcg total) by mouth daily before breakfast.  . polyethylene glycol powder (MIRALAX) powder Take 17 g by mouth daily.   No facility-administered encounter medications on file as of 05/30/2018.     Activities of Daily Living No flowsheet data found.  Patient Care Team: [name], [name], MD as PCP - General (Family Medicine) Nahser, Wonda Cheng, MD as PCP - Cardiology (Cardiology) Deboraha Sprang, MD as PCP - Electrophysiology (Cardiology) Donato Heinz, MD as Consulting Physician (Nephrology) Antionette Fairy Isaias Cowman, MD as Referring Physician (Ophthalmology)    Assessment:   This is a routine wellness examination for Nariah. No ROS.  Medicare Wellness Visit. Additional risk factors are reflected in the social history.   Exercise Activities and Dietary recommendations    Goals    . Continue taking care of dog       Fall Risk Fall Risk  04/02/2017 03/01/2016 02/24/2015  Falls in the past year? No No Yes  Number falls in past yr: - - 1  Injury with Fall? - - No    Depression Screen PHQ 2/9 Scores 04/02/2017 03/01/2016 02/24/2015 05/25/2014  PHQ - 2 Score 0 0 0 0     Cognitive Function MMSE - Mini Mental State Exam 04/02/2017 03/01/2016 02/24/2015  Not  completed: Unable to complete Refused -  Orientation to time - - 0  Registration - - 3  Attention/ Calculation - - 5  Recall - - 0  Language- name 2 objects - - 2  Language- repeat - - 1  Language- follow 3 step command - - 3  Language- read & follow direction - - 0  Language-read & follow direction-comments - - unable to see   Write a sentence - - 0  Write a sentence-comments - - unable to see  Copy design - - (No Data)  Copy design-comments - - unable to see        Immunization History  Administered Date(s) Administered  . Influenza Split 05/11/2011, 01/19/2016  . Influenza Whole 12/18/2005, 01/20/2009, 01/05/2010  . Influenza,inj,Quad PF,6+ Mos 11/26/2012, 03/12/2014, 02/24/2015  . Pneumococcal Conjugate-13 04/09/2017  . Pneumococcal Polysaccharide-23 08/27/2008  . Td 05/18/2005    Screening Tests Health Maintenance  Topic Date Due  . DEXA SCAN  04/15/2001  . OPHTHALMOLOGY EXAM  03/01/2012  . TETANUS/TDAP  05/19/2015  . INFLUENZA VACCINE  10/18/2017  . HEMOGLOBIN A1C  03/05/2018  . FOOT EXAM  04/09/2018  . PNA vac Low Risk Adult  Completed       Plan:   ***   I have personally reviewed and noted the following in the patient's chart:   . Medical and social history . Use of alcohol, tobacco or illicit drugs  . Current medications and supplements . Functional ability and status . Nutritional status . Physical activity . Advanced directives . List of other physicians . Hospitalizations,  surgeries, and ER visits in previous 12 months . Vitals . Screenings to include cognitive, depression, and falls . Referrals and appointments  In addition, I have reviewed and discussed with patient certain preventive protocols, quality metrics, and best practice recommendations. A written personalized care plan for preventive services as well as general preventive health recommendations were provided to patient.     Shela Nevin, [place]  05/29/2018

## 2018-05-30 ENCOUNTER — Ambulatory Visit: Payer: PPO | Admitting: *Deleted

## 2018-06-11 ENCOUNTER — Ambulatory Visit: Payer: PPO | Admitting: *Deleted

## 2018-06-18 ENCOUNTER — Telehealth: Payer: Self-pay | Admitting: Cardiovascular Disease

## 2018-06-18 NOTE — Telephone Encounter (Signed)
Left voicemail for patient to cancel echo 4/6 and reschedule in 3 months due to corona virus concerns

## 2018-06-24 ENCOUNTER — Other Ambulatory Visit (HOSPITAL_COMMUNITY): Payer: PPO

## 2018-06-24 ENCOUNTER — Ambulatory Visit: Payer: PPO | Admitting: Physician Assistant

## 2018-06-26 ENCOUNTER — Telehealth: Payer: Self-pay | Admitting: Family Medicine

## 2018-06-26 NOTE — Telephone Encounter (Signed)
Pt's family member Shaquita Fort) dropped off document to be filled out by provider (FMLA- for Barbara Garner who is taking care of pt- 4 pages) Mr Areatha Kalata stated would like to be call when document ready to pick up. Document put at front office tray under providers name.

## 2018-07-03 ENCOUNTER — Telehealth: Payer: Self-pay | Admitting: Physician Assistant

## 2018-07-03 NOTE — Telephone Encounter (Signed)
°  4/15 :  Left VM for patient to return call regarding appointment on 4/22. Visit needs to be change to a video/phone visit.

## 2018-07-04 NOTE — Telephone Encounter (Signed)
Barbara Garner came by the office on 07/04/18 to check on the status of the FMLA forms. Stated that the forms need to be completed and turned in by next week. Please call Mickey at [phone] when forms are ready to be picked up.

## 2018-07-04 NOTE — Telephone Encounter (Signed)
Ivin Booty do you have these forms? I do not have them.

## 2018-07-05 NOTE — Telephone Encounter (Signed)
I have paperwork, will be working on all FMLA/Disability paperwork today; first chance I've had this week, sorry/SLS 04/17

## 2018-07-05 NOTE — Telephone Encounter (Signed)
Barbara Garner notified to pick paperwork up on Monday, he is ok with that.

## 2018-07-05 NOTE — Telephone Encounter (Signed)
Pt's  Son is asking for 12 weeks of leave to care for his mother [patient]; forwarded to provider/SLS 04/17

## 2018-07-08 DIAGNOSIS — Z0279 Encounter for issue of other medical certificate: Secondary | ICD-10-CM

## 2018-07-08 NOTE — Telephone Encounter (Signed)
I completed paperwork and called her son- he will pick up at the front dest

## 2018-07-10 ENCOUNTER — Ambulatory Visit: Payer: PPO | Admitting: Physician Assistant

## 2018-08-03 NOTE — Progress Notes (Addendum)
[place] [facility] [address] [place], [place] [address] [phone]  Date:  08/05/2018   Name:  Barbara Garner   DOB:  1935/02/02   MRN:  [id number]  PCP:  Darreld Mclean, MD    Chief Complaint: Diabetes (less functional)   History of Present Illness:  Barbara Garner is a 84 y.o. very pleasant female patient who presents with the following:  In person visit today for follow-up  I last saw her in the office about a year ago  Elderly lady with history of dementia first noted about 5 years ago, hypothyroidism, DM, CAD s/p CABP, CHB with pacemaker, carotid stenosis, HTN  Per last cardiology note from September:  Coronary artery disease involving native coronary artery of native heart without angina pectoris S/p CABG in 2013.  She denies symptoms of angina.  She is somewhat sedentary.  Continue ASA, statin, beta-blocker.  She was previously followed by Dr. Aundra Dubin. I will have her follow up for general cardiology with me and Dr. Acie Fredrickson.   S/P aortic valve replacement Continue SBE prophylaxis.  Echo in 2015 with stable gradients.  Arrange follow up echocardiogram.   Bilateral carotid artery disease, unspecified type (HCC) Continue ASA, statin.  Arrange follow up carotid US. Essential hypertension The patient's blood pressure is controlled on her current regimen.  Continue current therapy.   Hyperlipidemia, unspecified hyperlipidemia type LDL optimal on most recent lab work.  Continue current Rx.   Pacemaker Follow up with EP as planned.   Lab Results  Component Value Date   HGBA1C 6.5 09/03/2017    Lab Results  Component Value Date   TSH 2.15 09/03/2017  due for complete labs   Her son Barbara Garner accompanies her to the visit today, provides the history.  He notes that she is getting more weak and is having a harder time functioning at home.  It is now more difficult for him to get her out of the house, she was frightened  during the car ride to the office today. She is able to feed herself, but is no longer able to stand or even sit up on her own. 2 months ago she could walk to the bathroom and could also indicate when she needed to go.  She is no longer able to do either of these things No longer able to rise from a chair She is totally incontinent now and is wearing diapers Her son will lift her into her wheelchair, and they are transfering her from bed to chair and also taking her to sit outdoors frequently  Costella is living in a basement apartment with another one of her sons, in [place] home.    Barbara Garner is no longer working due to pandemic, and states that he does not plan to go back to work anytime soon.  He is really able to manage her care at home with the help of his brother  He would like to try and get a hospital bed-this will make it easier to get pro in and out of bed without having to pull on her arms and potentially injure her  Her sons are bathing Barbara Garner at home, they are watching carefully for any bedsores.  Barbara Garner notes that his mom has not complained of any particular pain or discomfort.  She seems to be comfortable despite her health problems She takes her medications without complaint  Patient Active Problem List   Diagnosis Date Noted  .  Thyroid activity decreased 05/14/2015  . Hyperkalemia 05/25/2014  . CN (constipation) 05/08/2014  . Diabetes mellitus type 2, controlled ([place]) 03/11/2014  . Dementia ([place]) 12/17/2013  . Memory loss, short term 10/29/2013  . Low back pain 09/24/2013  . Sinus bradycardia 09/24/2012  . Vitamin D deficiency disease 08/23/2012  . Lichen sclerosus et atrophicus of the vulva 04/05/2012  . Chronic diastolic CHF (congestive heart failure) ([place]) 11/15/2011  . Pacemaker-Medtronic 09/13/2011  . Atrial fibrillation ([place]) 09/12/2011  . Complete heart block-intermittent 09/09/2011  . S/P aortic valve replacement 09/06/2011  . S/P CABG x 3 09/06/2011  . IVCD  (intraventricular conduction defect)   . CKD (chronic kidney disease) stage 4, GFR 15-29 ml/min (HCC)   . Carotid stenosis 05/30/2011  . Squamous cell carcinoma of skin 07/21/2010  . Hyperlipidemia 09/20/2006  . ANEMIA-IRON DEFICIENCY 06/27/2006  . PERIPHERAL NEUROPATHY 06/27/2006  . Essential hypertension 06/27/2006  . MYOCARDIAL INFARCTION, HX OF 06/27/2006  . CAD (coronary artery disease) 06/27/2006  . ASTHMA 06/27/2006  . ECZEMA 06/27/2006    Past Medical History:  Diagnosis Date  . Atrioventricular block, complete -intermittent   . CAD  S/P CABG x 3 08/2011    LIMA to diagonal branch, SVG to OM1, SVG to PDA, EVH via bilateral thighs  . Carotid artery disease ([place])    a. Carotid US 0/10 with 27% LICA stenosis;  b. Carotid US 11/16:  Bilateral ICA 40-59% >> FU 1 year // c. Carotid US 11/17: R 40-59; L 1-39 // Carotid US 11/19: R 40-59; L 1-39 >> repeat 1 year  . Chronic diastolic CHF (congestive heart failure) ([place])   . CKD (chronic kidney disease) stage 4, GFR 15-29 ml/min (HCC)    Cr 1.6 in 6/11, sees Dr. Arty Baumgartner  . Contact dermatitis and other eczema, due to unspecified cause   . Esophageal reflux   . Extrinsic asthma, unspecified    hasn't used inhaler in past year  . Headache(784.0)   . hypertension   . Hypothyroidism   . Idiopathic peripheral neuropathy   . Impaired vision    pt. reports that she identifies her meds by looking at the pillls, she is not able to read labels on bottles  . Iron deficiency anemia, unspecified   . LBBB (left bundle branch block)   . Memory loss   . Pacemaker MDT dual chamber    DOI 08/2011  . Pure hypercholesterolemia   . Severe aortic stenosis -S/P AVR     19 mm [facility] Ease pericardial tissue valve  . Type II or unspecified type diabetes mellitus without mention of complication, not stated as uncontrolled     Past Surgical History:  Procedure Laterality Date  . ABDOMINAL HYSTERECTOMY  1977   partial; fibroids  . AORTIC  VALVE REPLACEMENT  09/06/2011   Procedure: AORTIC VALVE REPLACEMENT (AVR);  Surgeon: Rexene Alberts, MD;  Location: [place];  Service: Open Heart Surgery;  Laterality: N/A;  . bladder tack  1977  . CARDIAC CATHETERIZATION    . CARDIOVASCULAR STRESS TEST  10/2002   normal (per patient)  . carotid dopplers  2006  . CATARACT EXTRACTION, BILATERAL  2003  . COLONOSCOPY  04/1999   1 polyp  . COLONOSCOPY  06/2004   Neg. Int hem  . COLONOSCOPY  08/2013  . CORONARY ARTERY BYPASS GRAFT  09/06/2011   Procedure: CORONARY ARTERY BYPASS GRAFTING (CABG);  Surgeon: Rexene Alberts, MD;  Location: [place];  Service: Open Heart Surgery;  Laterality: N/A;  .  DEXA  10/2003 ,09/2005   osteoporosis,  Osteopenia  . ESOPHAGEAL DILATION  1997  . ESOPHAGOGASTRODUODENOSCOPY  04/1999   HH; "watermelon stomach" (severe gastritis)  . ESOPHAGOGASTRODUODENOSCOPY  04/2002   Gastritis  . LEFT AND RIGHT HEART CATHETERIZATION WITH CORONARY ANGIOGRAM N/A 08/11/2011   Procedure: LEFT AND RIGHT HEART CATHETERIZATION WITH CORONARY ANGIOGRAM;  Surgeon: Larey Dresser, MD;  Location: [facility] C[facility] LAB;  Service: Cardiovascular;  Laterality: N/A;  . Opthy  11/1998;12/01;11/02  . PERMANENT PACEMAKER INSERTION N/A 2020-05-1011   Procedure: PERMANENT PACEMAKER INSERTION;  Surgeon: Deboraha Sprang, MD;  Location: [facility] CATH LAB;  Service: Cardiovascular;  Laterality: N/A;  . PTCA  [id number]   5 blockages  . REFRACTIVE SURGERY  2006    Social History   Tobacco Use  . Smoking status: Never Smoker  . Smokeless tobacco: Never Used  Substance Use Topics  . Alcohol use: No    Alcohol/week: 0.0 standard drinks  . Drug use: No    Family History  Problem Relation Age of Onset  . Stroke Mother        died in her 54's  . Stroke Father        died in his 2's  . Diabetes Father   . Prostate cancer Son   . Diabetes Son        #2  . Hypertension Son        #3  . Thyroid disease Neg Hx   . Heart attack Neg Hx     Allergies  Allergen Reactions   . Diflucan [Fluconazole] Rash    Renal Failure  . Keflex [Cephalexin]     rash  . Metformin     REACTION: intolerant  . Promethazine Hcl     REACTION: u/k    Medication list has been reviewed and updated.  Current Outpatient Medications on File Prior to Visit  Medication Sig Dispense Refill  . aspirin 81 MG EC tablet Take 1 tablet (81 mg total) by mouth daily. 30 tablet 3  . atorvastatin (LIPITOR) 40 MG tablet TAKE 1 TABLET BY MOUTH ONCE DAILY 90 tablet 1  . carvedilol (COREG) 25 MG tablet TAKE 1/2 (ONE-HALF) TABLET BY MOUTH TWICE DAILY WITH MEALS 90 tablet 1  . Cholecalciferol (VITAMIN D-3) 1000 UNITS CAPS Take 1 capsule by mouth daily.    Marland Kitchen levothyroxine (SYNTHROID, LEVOTHROID) 25 MCG tablet Take 1 tablet (25 mcg total) by mouth daily before breakfast. 90 tablet 1  . polyethylene glycol powder (MIRALAX) powder Take 17 g by mouth daily. 255 g 0   No current facility-administered medications on file prior to visit.     Review of Systems:  As per HPI- otherwise negative.  No fever or chills Physical Examination: Vitals:   08/05/18 1126  BP: 136/80  Pulse: 61  Resp: 15  Temp: 97.8 F (36.6 C)  SpO2: 94%   Vitals:   There is no height or weight on file to calculate BMI. Ideal Body Weight:    GEN: WDWN, NAD, Non-toxic, elderly woman who appears older than age, sitting in wheelchair today.  Barbara Garner is alert, is able to give her name and date of birth.  She is not able to name the month, year, or season Her hearing seems adequate HEENT: Atraumatic, Normocephalic. Neck supple. No masses, No LAD. Ears and Nose: No external deformity. CV: RRR, No M/G/R. No JVD. No thrill. No extra heart sounds. PULM: CTA B, no wheezes, crackles, rhonchi. No retractions. No resp. distress. No accessory muscle  use. There is a resolving bruise on her left breast EXTR: No c/c/e NEURO sitting in wheelchair.  She is able to move all of her limbs. I do notice that hamstring tightness is  present PSYCH: Advanced dementia is present.  Patient is not able to name a wrist watch or pen.  However she appears comfortable, no distress, not anxious.    Assessment and Plan: Dementia without behavioral disturbance, unspecified dementia type ([place])  Controlled type 2 diabetes mellitus with diabetic polyneuropathy, without long-term current use of insulin (HCC) - Plan: Comprehensive metabolic panel, Hemoglobin A1c  Essential hypertension - Plan: CBC, Comprehensive metabolic panel  Hypothyroidism due to acquired atrophy of thyroid - Plan: TSH  Dyslipidemia - Plan: Lipid panel  Frail elderly  84 year old woman with history of dementia, first significant memory loss noted about 5 years ago.  She has continued to get worse, especially over the last few months.  Her family is caring for her at home, they wish to continue to do so and are managing okay.  Her son would like to get a hospital bed if possible, to make transfers easier and safer.  She now requires near 100% assist in transfers.   I think a hospital bed is very appropriate and necessary for her   We will check labs today as above. Right now Makisha is comfortable taking her medications, will continue to use as normal.  If this changes we can talk about stopping some or all of her medications  Discussed end-of-life issues today.  Barbara Garner has not formally become his mom's healthcare power of attorney, but he notes that himself and his siblings are all in agreement when he comes to her care.  I did recommend we make her a DNR, as I do not feel that chest compressions or intubation would be in her best interest.  Barbara Garner states agreement, but will need to discuss with his brothers.  I did sign a DNR form which they can display in her home it underwent is in agreement  Barbara Garner is not sure that he can get Barbara Garner out of the house for appointments in the future.  I advised him that he can do a virtual visit with cardiology.  I will check her renal  function today  Signed Barbara Blinks, MD  Need letter/rx for hospital bed with demo and insurance info faxed to [facility] [phone]  addnd 5/20- received labs as below: Results for orders placed or performed in visit on 08/05/18  CBC  Result Value Ref Range   WBC 9.0 4.0 - 10.5 K/uL   RBC 3.56 (L) 3.87 - 5.11 Mil/uL   Platelets 334.0 150.0 - 400.0 K/uL   Hemoglobin 10.9 (L) 12.0 - 15.0 g/dL   HCT 32.2 (L) 36.0 - 46.0 %   MCV 90.4 78.0 - 100.0 fl   MCHC 33.7 30.0 - 36.0 g/dL   RDW 14.7 11.5 - 15.5 %  Comprehensive metabolic panel  Result Value Ref Range   Sodium 144 135 - 145 mEq/L   Potassium 3.5 3.5 - 5.1 mEq/L   Chloride 102 96 - 112 mEq/L   CO2 32 19 - 32 mEq/L   Glucose, Bld 240 (H) 70 - 99 mg/dL   BUN 20 6 - 23 mg/dL   Creatinine, Ser 1.06 0.40 - 1.20 mg/dL   Total Bilirubin 0.5 0.2 - 1.2 mg/dL   Alkaline Phosphatase 105 39 - 117 U/L   AST 18 0 - 37 U/L   ALT  12 0 - 35 U/L   Total Protein 6.5 6.0 - 8.3 g/dL   Albumin 3.0 (L) 3.5 - 5.2 g/dL   Calcium 8.3 (L) 8.4 - 10.5 mg/dL   GFR 49.59 (L) >60.00 mL/min  Hemoglobin A1c  Result Value Ref Range   Hgb A1c MFr Bld 9.3 (H) 4.6 - 6.5 %  Lipid panel  Result Value Ref Range   Cholesterol 117 0 - 200 mg/dL   Triglycerides 103.0 0.0 - 149.0 mg/dL   HDL 36.30 (L) >39.00 mg/dL   VLDL 20.6 0.0 - 40.0 mg/dL   LDL Cholesterol 60 0 - 99 mg/dL   Total CHOL/HDL Ratio 3    NonHDL 80.79   TSH  Result Value Ref Range   TSH 4.33 0.35 - 4.50 uIU/mL   Called to discuss A1c with her son Barbara Garner She was on glipizide in the past up until about 2018 when this was stopped due to age and excellent A1c Will have her restart now at 2.5 mg. Asked them to check her glucose on occasion to make sure no lows Also noted mild anemia- this is not new, seen back to 2015,  Hg is stable  Of note she did have a colonoscopy as well as endoscopy in 2015 which looked ok

## 2018-08-05 ENCOUNTER — Encounter: Payer: Self-pay | Admitting: Family Medicine

## 2018-08-05 ENCOUNTER — Other Ambulatory Visit: Payer: Self-pay

## 2018-08-05 ENCOUNTER — Ambulatory Visit (INDEPENDENT_AMBULATORY_CARE_PROVIDER_SITE_OTHER): Payer: PPO | Admitting: Family Medicine

## 2018-08-05 VITALS — BP 136/80 | HR 61 | Temp 97.8°F | Resp 15

## 2018-08-05 DIAGNOSIS — E785 Hyperlipidemia, unspecified: Secondary | ICD-10-CM | POA: Diagnosis not present

## 2018-08-05 DIAGNOSIS — I10 Essential (primary) hypertension: Secondary | ICD-10-CM

## 2018-08-05 DIAGNOSIS — E1142 Type 2 diabetes mellitus with diabetic polyneuropathy: Secondary | ICD-10-CM | POA: Diagnosis not present

## 2018-08-05 DIAGNOSIS — F0390 Unspecified dementia without behavioral disturbance: Secondary | ICD-10-CM | POA: Diagnosis not present

## 2018-08-05 DIAGNOSIS — E034 Atrophy of thyroid (acquired): Secondary | ICD-10-CM | POA: Diagnosis not present

## 2018-08-05 DIAGNOSIS — R54 Age-related physical debility: Secondary | ICD-10-CM

## 2018-08-05 LAB — CBC
HCT: 32.2 % — ABNORMAL LOW (ref 36.0–46.0)
Hemoglobin: 10.9 g/dL — ABNORMAL LOW (ref 12.0–15.0)
MCHC: 33.7 g/dL (ref 30.0–36.0)
MCV: 90.4 fl (ref 78.0–100.0)
Platelets: 334 10*3/uL (ref 150.0–400.0)
RBC: 3.56 Mil/uL — ABNORMAL LOW (ref 3.87–5.11)
RDW: 14.7 % (ref 11.5–15.5)
WBC: 9 10*3/uL (ref 4.0–10.5)

## 2018-08-05 LAB — COMPREHENSIVE METABOLIC PANEL
ALT: 12 U/L (ref 0–35)
AST: 18 U/L (ref 0–37)
Albumin: 3 g/dL — ABNORMAL LOW (ref 3.5–5.2)
Alkaline Phosphatase: 105 U/L (ref 39–117)
BUN: 20 mg/dL (ref 6–23)
CO2: 32 mEq/L (ref 19–32)
Calcium: 8.3 mg/dL — ABNORMAL LOW (ref 8.4–10.5)
Chloride: 102 mEq/L (ref 96–112)
Creatinine, Ser: 1.06 mg/dL (ref 0.40–1.20)
GFR: 49.59 mL/min — ABNORMAL LOW (ref >60.00)
Glucose, Bld: 240 mg/dL — ABNORMAL HIGH (ref 70–99)
Potassium: 3.5 mEq/L (ref 3.5–5.1)
Sodium: 144 mEq/L (ref 135–145)
Total Bilirubin: 0.5 mg/dL (ref 0.2–1.2)
Total Protein: 6.5 g/dL (ref 6.0–8.3)

## 2018-08-05 LAB — LIPID PANEL
Cholesterol: 117 mg/dL (ref 0–200)
HDL: 36.3 mg/dL — ABNORMAL LOW (ref >39.00)
LDL Cholesterol: 60 mg/dL (ref 0–99)
NonHDL: 80.79
Total CHOL/HDL Ratio: 3
Triglycerides: 103 mg/dL (ref 0.0–149.0)
VLDL: 20.6 mg/dL (ref 0.0–40.0)

## 2018-08-05 LAB — HEMOGLOBIN A1C: Hgb A1c MFr Bld: 9.3 % — ABNORMAL HIGH (ref 4.6–6.5)

## 2018-08-05 LAB — TSH: TSH: 4.33 u[IU]/mL (ref 0.35–4.50)

## 2018-08-05 NOTE — Patient Instructions (Signed)
It was very nice to see Barbara Garner today- I will be in touch with her labs as soon as possible I want to work on getting a hospital bed for you to use at home  [name]'s dementia appears to be worsening, but you are doing a great job taking care of her at home However if this eventually becomes too difficult, please let me know and we will try to help  If she starts having difficulty taking medication, we can talk about stopping her medicines.  As we discussed, I do not think we would want to resuscitate Selena with chest compressions or breathing tube.  Please discuss this with other family members to be sure everyone is on the same page

## 2018-08-07 MED ORDER — GLIPIZIDE 5 MG PO TABS: 2.5000 mg | ORAL_TABLET | Freq: Every day | ORAL | 3 refills | Status: DC

## 2018-08-07 NOTE — Addendum Note (Signed)
Addended by: Lamar Blinks C on: 08/07/2018 10:18 AM   Modules accepted: Orders

## 2018-08-30 ENCOUNTER — Encounter (HOSPITAL_COMMUNITY): Payer: Self-pay | Admitting: *Deleted

## 2018-08-30 ENCOUNTER — Emergency Department (HOSPITAL_COMMUNITY): Payer: PPO

## 2018-08-30 ENCOUNTER — Inpatient Hospital Stay (HOSPITAL_COMMUNITY)
Admission: EM | Admit: 2018-08-30 | Discharge: 2018-09-02 | DRG: 641 | Disposition: A | Discharge status: Hospice | Payer: PPO | Attending: Internal Medicine | Admitting: Internal Medicine

## 2018-08-30 ENCOUNTER — Other Ambulatory Visit: Payer: Self-pay

## 2018-08-30 ENCOUNTER — Ambulatory Visit: Payer: Self-pay | Admitting: *Deleted

## 2018-08-30 DIAGNOSIS — K219 Gastro-esophageal reflux disease without esophagitis: Secondary | ICD-10-CM | POA: Diagnosis present

## 2018-08-30 DIAGNOSIS — Z79890 Hormone replacement therapy (postmenopausal): Secondary | ICD-10-CM

## 2018-08-30 DIAGNOSIS — R404 Transient alteration of awareness: Secondary | ICD-10-CM | POA: Diagnosis not present

## 2018-08-30 DIAGNOSIS — I5032 Chronic diastolic (congestive) heart failure: Secondary | ICD-10-CM | POA: Diagnosis not present

## 2018-08-30 DIAGNOSIS — Z66 Do not resuscitate: Secondary | ICD-10-CM | POA: Diagnosis present

## 2018-08-30 DIAGNOSIS — E119 Type 2 diabetes mellitus without complications: Secondary | ICD-10-CM | POA: Diagnosis not present

## 2018-08-30 DIAGNOSIS — Z950 Presence of cardiac pacemaker: Secondary | ICD-10-CM | POA: Diagnosis not present

## 2018-08-30 DIAGNOSIS — E860 Dehydration: Secondary | ICD-10-CM | POA: Diagnosis present

## 2018-08-30 DIAGNOSIS — Z1159 Encounter for screening for other viral diseases: Secondary | ICD-10-CM | POA: Diagnosis not present

## 2018-08-30 DIAGNOSIS — F0390 Unspecified dementia without behavioral disturbance: Secondary | ICD-10-CM | POA: Diagnosis not present

## 2018-08-30 DIAGNOSIS — Z7189 Other specified counseling: Secondary | ICD-10-CM

## 2018-08-30 DIAGNOSIS — E876 Hypokalemia: Secondary | ICD-10-CM | POA: Diagnosis present

## 2018-08-30 DIAGNOSIS — E7800 Pure hypercholesterolemia, unspecified: Secondary | ICD-10-CM | POA: Diagnosis present

## 2018-08-30 DIAGNOSIS — E1122 Type 2 diabetes mellitus with diabetic chronic kidney disease: Secondary | ICD-10-CM | POA: Diagnosis present

## 2018-08-30 DIAGNOSIS — N182 Chronic kidney disease, stage 2 (mild): Secondary | ICD-10-CM

## 2018-08-30 DIAGNOSIS — Z20828 Contact with and (suspected) exposure to other viral communicable diseases: Secondary | ICD-10-CM | POA: Diagnosis not present

## 2018-08-30 DIAGNOSIS — D509 Iron deficiency anemia, unspecified: Secondary | ICD-10-CM | POA: Diagnosis present

## 2018-08-30 DIAGNOSIS — R627 Adult failure to thrive: Principal | ICD-10-CM | POA: Diagnosis present

## 2018-08-30 DIAGNOSIS — Z681 Body mass index (BMI) 19 or less, adult: Secondary | ICD-10-CM

## 2018-08-30 DIAGNOSIS — K449 Diaphragmatic hernia without obstruction or gangrene: Secondary | ICD-10-CM | POA: Diagnosis present

## 2018-08-30 DIAGNOSIS — H547 Unspecified visual loss: Secondary | ICD-10-CM | POA: Diagnosis present

## 2018-08-30 DIAGNOSIS — N184 Chronic kidney disease, stage 4 (severe): Secondary | ICD-10-CM | POA: Diagnosis not present

## 2018-08-30 DIAGNOSIS — Z952 Presence of prosthetic heart valve: Secondary | ICD-10-CM | POA: Diagnosis not present

## 2018-08-30 DIAGNOSIS — I447 Left bundle-branch block, unspecified: Secondary | ICD-10-CM | POA: Diagnosis present

## 2018-08-30 DIAGNOSIS — Z823 Family history of stroke: Secondary | ICD-10-CM

## 2018-08-30 DIAGNOSIS — Z951 Presence of aortocoronary bypass graft: Secondary | ICD-10-CM | POA: Diagnosis not present

## 2018-08-30 DIAGNOSIS — R5381 Other malaise: Secondary | ICD-10-CM | POA: Diagnosis not present

## 2018-08-30 DIAGNOSIS — I2510 Atherosclerotic heart disease of native coronary artery without angina pectoris: Secondary | ICD-10-CM | POA: Diagnosis present

## 2018-08-30 DIAGNOSIS — M2550 Pain in unspecified joint: Secondary | ICD-10-CM | POA: Diagnosis not present

## 2018-08-30 DIAGNOSIS — I10 Essential (primary) hypertension: Secondary | ICD-10-CM | POA: Diagnosis present

## 2018-08-30 DIAGNOSIS — E039 Hypothyroidism, unspecified: Secondary | ICD-10-CM | POA: Diagnosis not present

## 2018-08-30 DIAGNOSIS — Z79899 Other long term (current) drug therapy: Secondary | ICD-10-CM | POA: Diagnosis not present

## 2018-08-30 DIAGNOSIS — R531 Weakness: Secondary | ICD-10-CM

## 2018-08-30 DIAGNOSIS — R52 Pain, unspecified: Secondary | ICD-10-CM | POA: Diagnosis not present

## 2018-08-30 DIAGNOSIS — Z7984 Long term (current) use of oral hypoglycemic drugs: Secondary | ICD-10-CM

## 2018-08-30 DIAGNOSIS — D631 Anemia in chronic kidney disease: Secondary | ICD-10-CM | POA: Diagnosis present

## 2018-08-30 DIAGNOSIS — G609 Hereditary and idiopathic neuropathy, unspecified: Secondary | ICD-10-CM | POA: Diagnosis present

## 2018-08-30 DIAGNOSIS — Z888 Allergy status to other drugs, medicaments and biological substances status: Secondary | ICD-10-CM | POA: Diagnosis not present

## 2018-08-30 DIAGNOSIS — I442 Atrioventricular block, complete: Secondary | ICD-10-CM | POA: Diagnosis present

## 2018-08-30 DIAGNOSIS — L259 Unspecified contact dermatitis, unspecified cause: Secondary | ICD-10-CM | POA: Diagnosis present

## 2018-08-30 DIAGNOSIS — J45909 Unspecified asthma, uncomplicated: Secondary | ICD-10-CM | POA: Diagnosis present

## 2018-08-30 DIAGNOSIS — Z8249 Family history of ischemic heart disease and other diseases of the circulatory system: Secondary | ICD-10-CM

## 2018-08-30 DIAGNOSIS — I130 Hypertensive heart and chronic kidney disease with heart failure and stage 1 through stage 4 chronic kidney disease, or unspecified chronic kidney disease: Secondary | ICD-10-CM | POA: Diagnosis present

## 2018-08-30 DIAGNOSIS — R0902 Hypoxemia: Secondary | ICD-10-CM | POA: Diagnosis not present

## 2018-08-30 DIAGNOSIS — Z7401 Bed confinement status: Secondary | ICD-10-CM | POA: Diagnosis not present

## 2018-08-30 DIAGNOSIS — R4182 Altered mental status, unspecified: Secondary | ICD-10-CM | POA: Diagnosis not present

## 2018-08-30 DIAGNOSIS — Z7982 Long term (current) use of aspirin: Secondary | ICD-10-CM

## 2018-08-30 DIAGNOSIS — Z833 Family history of diabetes mellitus: Secondary | ICD-10-CM

## 2018-08-30 DIAGNOSIS — M40204 Unspecified kyphosis, thoracic region: Secondary | ICD-10-CM | POA: Diagnosis present

## 2018-08-30 DIAGNOSIS — R000 Tachycardia, unspecified: Secondary | ICD-10-CM | POA: Diagnosis not present

## 2018-08-30 LAB — CBC WITH DIFFERENTIAL/PLATELET
Abs Immature Granulocytes: 0.02 10*3/uL (ref 0.00–0.07)
Basophils Absolute: 0 10*3/uL (ref 0.0–0.1)
Basophils Relative: 0 %
Eosinophils Absolute: 0 10*3/uL (ref 0.0–0.5)
Eosinophils Relative: 0 %
HCT: 30.4 % — ABNORMAL LOW (ref 36.0–46.0)
Hemoglobin: 9.3 g/dL — ABNORMAL LOW (ref 12.0–15.0)
Immature Granulocytes: 0 %
Lymphocytes Relative: 18 %
Lymphs Abs: 1.5 10*3/uL (ref 0.7–4.0)
MCH: 29 pg (ref 26.0–34.0)
MCHC: 30.6 g/dL (ref 30.0–36.0)
MCV: 94.7 fL (ref 80.0–100.0)
Monocytes Absolute: 0.7 10*3/uL (ref 0.1–1.0)
Monocytes Relative: 8 %
Neutro Abs: 6.1 10*3/uL (ref 1.7–7.7)
Neutrophils Relative %: 74 %
Platelets: 288 10*3/uL (ref 150–400)
RBC: 3.21 MIL/uL — ABNORMAL LOW (ref 3.87–5.11)
RDW: 14 % (ref 11.5–15.5)
WBC: 8.4 10*3/uL (ref 4.0–10.5)
nRBC: 0 % (ref 0.0–0.2)

## 2018-08-30 LAB — COMPREHENSIVE METABOLIC PANEL
ALT: 19 U/L (ref 0–44)
AST: 30 U/L (ref 15–41)
Albumin: 2.3 g/dL — ABNORMAL LOW (ref 3.5–5.0)
Alkaline Phosphatase: 65 U/L (ref 38–126)
Anion gap: 12 (ref 5–15)
BUN: 28 mg/dL — ABNORMAL HIGH (ref 8–23)
CO2: 27 mmol/L (ref 22–32)
Calcium: 8.8 mg/dL — ABNORMAL LOW (ref 8.9–10.3)
Chloride: 103 mmol/L (ref 98–111)
Creatinine, Ser: 1.13 mg/dL — ABNORMAL HIGH (ref 0.44–1.00)
GFR calc Af Amer: 52 mL/min — ABNORMAL LOW (ref >60)
GFR calc non Af Amer: 45 mL/min — ABNORMAL LOW (ref >60)
Glucose, Bld: 177 mg/dL — ABNORMAL HIGH (ref 70–99)
Potassium: 3.2 mmol/L — ABNORMAL LOW (ref 3.5–5.1)
Sodium: 142 mmol/L (ref 135–145)
Total Bilirubin: 0.6 mg/dL (ref 0.3–1.2)
Total Protein: 6.7 g/dL (ref 6.5–8.1)

## 2018-08-30 LAB — URINALYSIS, ROUTINE W REFLEX MICROSCOPIC
Bilirubin Urine: NEGATIVE
Glucose, UA: 50 mg/dL — AB
Ketones, ur: 5 mg/dL — AB
Leukocytes,Ua: NEGATIVE
Nitrite: NEGATIVE
Protein, ur: >=300 mg/dL — AB
Specific Gravity, Urine: 1.015 (ref 1.005–1.030)
pH: 5 (ref 5.0–8.0)

## 2018-08-30 LAB — POCT I-STAT EG7
Acid-Base Excess: 6 mmol/L — ABNORMAL HIGH (ref 0.0–2.0)
Bicarbonate: 29 mmol/L — ABNORMAL HIGH (ref 20.0–28.0)
Calcium, Ion: 1.1 mmol/L — ABNORMAL LOW (ref 1.15–1.40)
HCT: 28 % — ABNORMAL LOW (ref 36.0–46.0)
Hemoglobin: 9.5 g/dL — ABNORMAL LOW (ref 12.0–15.0)
O2 Saturation: 92 %
Potassium: 3.2 mmol/L — ABNORMAL LOW (ref 3.5–5.1)
Sodium: 143 mmol/L (ref 135–145)
TCO2: 30 mmol/L (ref 22–32)
pCO2, Ven: 35.1 mmHg — ABNORMAL LOW (ref 44.0–60.0)
pH, Ven: 7.525 — ABNORMAL HIGH (ref 7.250–7.430)
pO2, Ven: 55 mmHg — ABNORMAL HIGH (ref 32.0–45.0)

## 2018-08-30 LAB — TSH: TSH: 1.549 u[IU]/mL (ref 0.350–4.500)

## 2018-08-30 LAB — LACTIC ACID, PLASMA: Lactic Acid, Venous: 1 mmol/L (ref 0.5–1.9)

## 2018-08-30 MED ORDER — HEPARIN SODIUM (PORCINE) 5000 UNIT/ML IJ SOLN
5000.0000 [IU] | Freq: Three times a day (TID) | INTRAMUSCULAR | Status: DC
  Administered 2018-08-30 – 2018-08-31 (×3): 5000 [IU] via SUBCUTANEOUS
  Filled 2018-08-30: qty 1

## 2018-08-30 MED ORDER — LACTATED RINGERS IV BOLUS
1000.0000 mL | Freq: Once | INTRAVENOUS | Status: AC
  Administered 2018-08-30: 22:00:00 1000 mL via INTRAVENOUS

## 2018-08-30 MED ORDER — ONDANSETRON HCL 4 MG PO TABS: 4.0000 mg | ORAL_TABLET | Freq: Four times a day (QID) | ORAL | Status: DC | PRN

## 2018-08-30 MED ORDER — ACETAMINOPHEN 325 MG PO TABS: 650.0000 mg | ORAL_TABLET | Freq: Four times a day (QID) | ORAL | Status: DC | PRN

## 2018-08-30 MED ORDER — ASPIRIN EC 81 MG PO TBEC: 81.0000 mg | DELAYED_RELEASE_TABLET | Freq: Every day | ORAL | Status: DC

## 2018-08-30 MED ORDER — LEVOTHYROXINE SODIUM 25 MCG PO TABS: 25.0000 ug | ORAL_TABLET | Freq: Every day | ORAL | Status: DC

## 2018-08-30 MED ORDER — INSULIN ASPART 100 UNIT/ML ~~LOC~~ SOLN
0.0000 [IU] | Freq: Three times a day (TID) | SUBCUTANEOUS | Status: DC
  Administered 2018-08-31: 1 [IU] via SUBCUTANEOUS

## 2018-08-30 MED ORDER — POTASSIUM CHLORIDE 20 MEQ/15ML (10%) PO SOLN: 40.0000 meq | Freq: Once | ORAL | Status: DC

## 2018-08-30 MED ORDER — POLYETHYLENE GLYCOL 3350 17 G PO PACK: 17.0000 g | PACK | Freq: Every day | ORAL | Status: DC | PRN

## 2018-08-30 MED ORDER — CARVEDILOL 12.5 MG PO TABS
12.5000 mg | ORAL_TABLET | Freq: Two times a day (BID) | ORAL | Status: DC
  Filled 2018-08-30: qty 1

## 2018-08-30 MED ORDER — POTASSIUM CHLORIDE 10 MEQ/100ML IV SOLN
10.0000 meq | INTRAVENOUS | Status: AC
  Administered 2018-08-31 (×4): 10 meq via INTRAVENOUS
  Filled 2018-08-30 (×4): qty 100

## 2018-08-30 MED ORDER — POTASSIUM CHLORIDE 2 MEQ/ML IV SOLN
INTRAVENOUS | Status: AC
  Administered 2018-08-30: via INTRAVENOUS
  Filled 2018-08-30: qty 1000

## 2018-08-30 MED ORDER — ONDANSETRON HCL 4 MG/2ML IJ SOLN: 4.0000 mg | Freq: Four times a day (QID) | INTRAMUSCULAR | Status: DC | PRN

## 2018-08-30 MED ORDER — ACETAMINOPHEN 650 MG RE SUPP: 650.0000 mg | Freq: Four times a day (QID) | RECTAL | Status: DC | PRN

## 2018-08-30 NOTE — ED Provider Notes (Signed)
Care assumed from Dr. Eulis Foster.  Please see his full H&P.  In short,  Barbara Garner is a 84 y.o. female presents for altered mental status. Patient arrived via EMS from home with family.   Called and gathered additional history from patient's son, [name]. Son reports patient has been deteriorating over the last 2 months. Son reports patient used to walk and has stopped walking completely and is now wheelchair bound. Son states patient lives with him, but he went on vacation and patient severely deteriorated by the time he came back. Son states other son watched mother during his vacation. Son states patient has stopped talking and will only grunt over the last 2 days. Son denies fever, chills, cough, chest pain, shortness of breath, nausea, vomiting, diarrhea, or abdominal pain. Son denies sick contacts or recent travel.   Physical Exam  BP (!) 170/85   Pulse 64   Temp 98 F (36.7 C)   Resp 20   SpO2 94%   Physical Exam Vitals signs and nursing note reviewed.  Constitutional:      General: She is not in acute distress.    Appearance: She is well-developed. She is ill-appearing. She is not diaphoretic.     Comments: Patient is hunched over lying on her left side with neck flexed. Patient is thin and appears malnourished.   HENT:     Head: Normocephalic and atraumatic.     Mouth/Throat:     Mouth: Mucous membranes are dry.  Neck:     Musculoskeletal: Normal range of motion.  Cardiovascular:     Rate and Rhythm: Normal rate and regular rhythm.     Heart sounds: Normal heart sounds. No murmur. No friction rub. No gallop.   Pulmonary:     Effort: Pulmonary effort is normal. No respiratory distress.     Breath sounds: Normal breath sounds. No wheezing or rales.  Abdominal:     Palpations: Abdomen is soft.     Tenderness: There is no abdominal tenderness.  Musculoskeletal:        General: No tenderness.     Comments: Limited ROM of extremities due to generalized weakness.   Skin:  General: Skin is warm.     Findings: No erythema or rash.  Neurological:     Mental Status: She is alert.     Motor: Weakness (Pt appears to have generalized weakness.) present.     Comments: Patient is able to speak a few words intermittently. patient is able to follow minimal commands. Patient is able to squeeze hands and move feet.      ED Course/Procedures     Procedures  MDM   Patient presents with altered mental status. WBCs are within normal limits. Patient is afebrile. UA reveals Hgb, protein, ketones, RBCs, and many bacteria. Urine culture ordered. EKG reveals a longer QT interval. CXR is difficult to assess due to position of patient. CXR reveals a moderate hiatal hernia and possible left lower lobe airspace opacity. Head CT reveals no definitely acute intracranial abnormality, but motion was present during study. IVF ordered. Discussed case with Dr. Rex Kras. Dr. Rex Kras agrees with plan to admit.      Arville Lime, [place] 08/30/18 2112    Daleen Bo, MD 08/30/18 2151

## 2018-08-30 NOTE — Telephone Encounter (Signed)
  Answer Assessment - Initial Assessment Questions 1. SYMPTOM: "What is the main symptom you are concerned about?" (e.g., weakness, numbness)     *No Answer* 2. ONSET: "When did this start?" (minutes, hours, days; while sleeping)     *No Answer* 3. LAST NORMAL: "When was the last time you were normal (no symptoms)?"     *No Answer* 4. PATTERN "Does this come and go, or has it been constant since it started?"  "Is it present now?"     *No Answer* 5. CARDIAC SYMPTOMS: "Have you had any of the following symptoms: chest pain, difficulty breathing, palpitations?"     *No Answer* 6. NEUROLOGIC SYMPTOMS: "Have you had any of the following symptoms: headache, dizziness, vision loss, double vision, changes in speech, unsteady on your feet?"     *No Answer* 7. OTHER SYMPTOMS: "Do you have any other symptoms?"     *No Answer* 8. PREGNANCY: "Is there any chance you are pregnant?" "When was your last menstrual period?"     *No Answer*  Protocols used: NEUROLOGIC DEFICIT-A-AH

## 2018-08-30 NOTE — Telephone Encounter (Signed)
  Reason for Disposition . Difficult to awaken or acting confused (e.g., disoriented, slurred speech)  Answer Assessment - Initial Assessment Questions 1. SYMPTOM: "What is the main symptom you are concerned about?" (e.g., weakness, numbness)     *No Answer* 2. ONSET: "When did this start?" (minutes, hours, days; while sleeping)     *No Answer* 3. LAST NORMAL: "When was the last time you were normal (no symptoms)?"     *No Answer* 4. PATTERN "Does this come and go, or has it been constant since it started?"  "Is it present now?"     *No Answer* 5. CARDIAC SYMPTOMS: "Have you had any of the following symptoms: chest pain, difficulty breathing, palpitations?"     *No Answer* 6. NEUROLOGIC SYMPTOMS: "Have you had any of the following symptoms: headache, dizziness, vision loss, double vision, changes in speech, unsteady on your feet?"     *No Answer* 7. OTHER SYMPTOMS: "Do you have any other symptoms?"     *No Answer* 8. PREGNANCY: "Is there any chance you are pregnant?" "When was your last menstrual period?"     *No Answer*  Protocols used: NEUROLOGIC DEFICIT-A-AH

## 2018-08-30 NOTE — ED Notes (Signed)
The pts son greg s number [phone]   The pts son reports that the pt stopped walking with her walker approx one months ago  She has been lying in the fetal position  For one week

## 2018-08-30 NOTE — ED Notes (Signed)
Head bent over will not keep it upright  Questions  I have asked she answers but not what I have asked her

## 2018-08-30 NOTE — ED Notes (Signed)
Delay in lab draw,  Pt receiving peri care at this time. 

## 2018-08-30 NOTE — ED Provider Notes (Signed)
I received this patient in signout from Dr. Eulis Foster and PA Jerilee Hoh. She had presented with altered mental status from baseline, 2 months of progressive functional decline but over the past 2 days it has been more rapid and she has not been able to get out of bed, is normally ambulatory.  At time of signout, her lab work was complete and head CT, chest x-ray negative for acute process, awaiting callback from hospitalist regarding admission.  I discussed with [facility], Dr. Posey Pronto, and patient admitted for further work-up.   Rishith Siddoway, Wenda [place], MD 08/30/18 2131

## 2018-08-30 NOTE — ED Notes (Signed)
Delay in lab draw,   Pt care at this time.

## 2018-08-30 NOTE — Telephone Encounter (Signed)
Pts son calling. States he just returned from out of town 15 minutes ago; pts other 2 sons were staying with pt all week. Reports change in status. Pt "Slumped over, just moans when asked any questions, can't hold her head up and seems stiff." States his brothers reported pt has not been drinking or eating and requiring max care. Son directed to call EMS, states he will do so.

## 2018-08-30 NOTE — ED Notes (Signed)
Admitting doctor at the bedside 

## 2018-08-30 NOTE — ED Triage Notes (Addendum)
Pt arrived via EMS from home with her family. Decreased mental status and appetite loss over 4-5 days. Alert to verbal only. cbg 248, 97.2, 180/116, paced on the monitor. #20 IV in the right wrist. No meds en route. Sats were 88-89% RA, 2L via [place], now in the 90's

## 2018-08-30 NOTE — ED Notes (Signed)
Unsuccessful attempt to give report rn on 6n to call me back

## 2018-08-30 NOTE — H&P (Signed)
History and Physical    Barbara Garner GYB:[id number] DOB: Jul 03, 1934 DOA: 08/30/2018  PCP: Darreld Mclean, MD  Patient coming from: Home  I have personally briefly reviewed patient's old medical records in [place]  Chief Complaint: Functional decline  HPI: Barbara Garner is a 84 y.o. female with medical history significant for dementia, CAD status post CABG, CHB s/p PPM, severeAS status post AVR, LBBB, type 2 diabetes, hypertension, CKD stage II, hypothyroidism, who brought to the ED by her son for progressive functional decline.    Patient has a known history of dementia, first noted about 5 years ago per recent PCP note.  She has been having progressive decline over the last 2 to 3 months and is requiring near 100% assist in transfers.  She currently lives with her son who cares for her.  He says that she has been bedridden for the last 2 months and no longer is able to stand up on her own or support herself sitting up.  They have requested a hospital bed which they are waiting for delivery.  Her only new medication is glipizide which she was previously on but restarted about 3 weeks ago due to elevated hemoglobin A1c.  Patient son, Barbara Garner, recently went on vacation during which time patient was cared for by her other son.  When Barbara Garner returned he noted significant further deterioration in that over the last 2-3 days she has essentially stopped talking and has had no oral intake.  ED Course:  Initial vitals showed BP 170/85, pulse 65, RR 20, temp 98.8 Fahrenheit, SPO2 94% on room air.  Labs are notable for WBC 8.4, hemoglobin 9.3, platelets 288,000, sodium 142, potassium 3.2, bicarb 27, BUN 28, creatinine 1.13, serum glucose 177, AST 30, ALT 19, alk phos 65, total bilirubin 0.6, albumin 2.3, lactic acid 1.0.  Urinalysis showed negative nitrites, negative leukocytes, many bacteria on microscopy.    VBG showed pH 7.525, PCO2 35.1, PO2 55.  Blood and urine cultures were obtained  and pending.  Novel coronavirus test was obtained and pending.  CT head without contrast was limited due to motion degradation, no definite acute intracranial abnormality noted.  Atrophy and microvascular ischemic changes seen as well as complete opacification of the maxillary sinuses and sphenoid sinuses.  Portable chest x-ray was severely rotated to the left limited in addition to thoracic kyphosis, moderate hiatal hernia noted left lower lobe airspace unable to be adequately seen.  Prior CABG changes and PPM seen.  Patient was given 1 L LR and the hospitalist service was consulted to admit for further evaluation management of encephalopathy versus progressive functional decline.  Review of Systems: . Unable to obtain full review of systems due to patient's dementia.  Past Medical History:  Diagnosis Date   Atrioventricular block, complete -intermittent    CAD  S/P CABG x 3 08/2011    LIMA to diagonal branch, SVG to OM1, SVG to PDA, EVH via bilateral thighs   Carotid artery disease ([place])    a. Carotid US 8/76 with 81% LICA stenosis;  b. Carotid US 11/16:  Bilateral ICA 40-59% >> FU 1 year // c. Carotid US 11/17: R 40-59; L 1-39 // Carotid US 11/19: R 40-59; L 1-39 >> repeat 1 year   Chronic diastolic CHF (congestive heart failure) (HCC)    CKD (chronic kidney disease) stage 4, GFR 15-29 ml/min (HCC)    Cr 1.6 in 6/11, sees Dr. Arty Baumgartner   Contact dermatitis and other eczema, due  to unspecified cause    Esophageal reflux    Extrinsic asthma, unspecified    hasn't used inhaler in past year   Headache(784.0)    hypertension    Hypothyroidism    Idiopathic peripheral neuropathy    Impaired vision    pt. reports that she identifies her meds by looking at the pillls, she is not able to read labels on bottles   Iron deficiency anemia, unspecified    LBBB (left bundle branch block)    Memory loss    Pacemaker MDT dual chamber    DOI 08/2011   Pure hypercholesterolemia      Severe aortic stenosis -S/P AVR     19 mm [facility] Ease pericardial tissue valve   Type II or unspecified type diabetes mellitus without mention of complication, not stated as uncontrolled     Past Surgical History:  Procedure Laterality Date   ABDOMINAL HYSTERECTOMY  1977   partial; fibroids   AORTIC VALVE REPLACEMENT  09/06/2011   Procedure: AORTIC VALVE REPLACEMENT (AVR);  Surgeon: Rexene Alberts, MD;  Location: [place];  Service: Open Heart Surgery;  Laterality: N/A;   bladder tack  1977   CARDIAC CATHETERIZATION     CARDIOVASCULAR STRESS TEST  10/2002   normal (per patient)   carotid dopplers  2006   CATARACT EXTRACTION, BILATERAL  2003   COLONOSCOPY  04/1999   1 polyp   COLONOSCOPY  06/2004   Neg. Int hem   COLONOSCOPY  08/2013   CORONARY ARTERY BYPASS GRAFT  09/06/2011   Procedure: CORONARY ARTERY BYPASS GRAFTING (CABG);  Surgeon: Rexene Alberts, MD;  Location: [place];  Service: Open Heart Surgery;  Laterality: N/A;   DEXA  10/2003 ,09/2005   osteoporosis,  Osteopenia   ESOPHAGEAL DILATION  1997   ESOPHAGOGASTRODUODENOSCOPY  04/1999   HH; "watermelon stomach" (severe gastritis)   ESOPHAGOGASTRODUODENOSCOPY  04/2002   Gastritis   LEFT AND RIGHT HEART CATHETERIZATION WITH CORONARY ANGIOGRAM N/A 08/11/2011   Procedure: LEFT AND RIGHT HEART CATHETERIZATION WITH CORONARY ANGIOGRAM;  Surgeon: Larey Dresser, MD;  Location[facility] CATH LAB;  Service: Cardiovascular;  Laterality: N/A;   Opthy  11/1998;12/01;11/02   PERMANENT PACEMAKER INSERTION N/A 01/02/2012   Procedure: PERMANENT PACEMAKER INSERTION;  Surgeon: Deboraha Sprang, MD;  Location: [facility] CATH LAB;  Service: Cardiovascular;  Laterality: N/A;   PTCA  [id number]   5 blockages   REFRACTIVE SURGERY  2006    Social History:  reports that she has never smoked. She has never used smokeless tobacco. She reports that she does not drink alcohol or use drugs.  Allergies  Allergen Reactions   Diflucan  [Fluconazole] Rash    Renal Failure   Keflex [Cephalexin] Rash    rash   Metformin Other (See Comments)    REACTION: intolerant   Promethazine Hcl Other (See Comments)    REACTION: u/k    Family History  Problem Relation Age of Onset   Stroke Mother        died in her 6's   Stroke Father        died in his 15's   Diabetes Father    Prostate cancer Son    Diabetes Son        #2   Hypertension Son        #3   Thyroid disease Neg Hx    Heart attack Neg Hx      Prior to Admission medications   Medication Sig Start Date End  Date Taking? Authorizing Provider  aspirin 81 MG EC tablet Take 1 tablet (81 mg total) by mouth daily. 11/14/13   [name], PA-C  atorvastatin (LIPITOR) 40 MG tablet TAKE 1 TABLET BY MOUTH ONCE DAILY 02/26/18   [name], [name], MD  carvedilol (COREG) 25 MG tablet TAKE 1/2 (ONE-HALF) TABLET BY MOUTH TWICE DAILY WITH MEALS 02/26/18   [name], [name], MD  Cholecalciferol (VITAMIN D-3) 1000 UNITS CAPS Take 1 capsule by mouth daily.    [provider]  glipiZIDE (GLUCOTROL) 5 MG tablet Take 0.5 tablets (2.5 mg total) by mouth daily before breakfast. 08/07/18   [name], [name], MD  levothyroxine (SYNTHROID, LEVOTHROID) 25 MCG tablet Take 1 tablet (25 mcg total) by mouth daily before breakfast. 11/26/17   [name], [name], MD  polyethylene glycol powder (MIRALAX) powder Take 17 g by mouth daily. 09/27/17   [name], April, MD    Physical Exam: Vitals:   08/30/18 1722 08/30/18 1730 08/30/18 1800 08/30/18 1939  BP:  (!) 186/80 (!) 170/85   Pulse:  69 64   Resp:  17 20   Temp: 98.8 F (37.1 C)   98 F (36.7 C)  TempSrc: Oral     SpO2:  93% 94%    Exam limited due to patient dementia and cooperation Constitutional: Chronically ill-appearing elderly woman resting in the left lateral decubitus position, somnolent but in no acute distress, appears comfortable Eyes: Patient keeping eyes closed ENMT: Patient not allowing  examination of oropharynx, lips are dry. Neck: Kyphosis present Respiratory: clear to auscultation bilaterally, no wheezing, no crackles. Normal respiratory effort. No accessory muscle use.  Cardiovascular: Regular rate and rhythm, PPM in place left chest wall. No extremity edema. 2+ pedal pulses. Abdomen: no tenderness. Musculoskeletal: Significant thoracic kyphosis present, moving upper extremities spontaneously and withdrawing feet from noxious stimuli. Skin: Dry skin Neurologic: Exam limited, moving upper extremities spontaneously and withdrawing lower extremities to noxious stimuli.  Sensation appears intact. Psychiatric: Somnolent but intermittently answers one-word questions.  States her name otherwise not significantly participating in conversation.   Labs on Admission: I have personally reviewed following labs and imaging studies  CBC: Recent Labs  Lab 08/30/18 1955 08/30/18 2002  WBC 8.4  --   NEUTROABS 6.1  --   HGB 9.3* 9.5*  HCT 30.4* 28.0*  MCV 94.7  --   PLT 288  --    Basic Metabolic Panel: Recent Labs  Lab 08/30/18 1955 08/30/18 2002  NA 142 143  K 3.2* 3.2*  CL 103  --   CO2 27  --   GLUCOSE 177*  --   BUN 28*  --   CREATININE 1.13*  --   CALCIUM 8.8*  --    GFR: CrCl cannot be calculated (Unknown ideal weight.). Liver Function Tests: Recent Labs  Lab 08/30/18 1955  AST 30  ALT 19  ALKPHOS 65  BILITOT 0.6  PROT 6.7  ALBUMIN 2.3*   No results for input(s): LIPASE, AMYLASE in the last 168 hours. No results for input(s): AMMONIA in the last 168 hours. Coagulation Profile: No results for input(s): INR, PROTIME in the last 168 hours. Cardiac Enzymes: No results for input(s): CKTOTAL, CKMB, CKMBINDEX, TROPONINI in the last 168 hours. BNP (last 3 results) No results for input(s): PROBNP in the last 8760 hours. HbA1C: No results for input(s): HGBA1C in the last 72 hours. CBG: No results for input(s): GLUCAP in the last 168 hours. Lipid  Profile: No results for input(s): CHOL, HDL,  LDLCALC, TRIG, CHOLHDL, LDLDIRECT in the last 72 hours. Thyroid Function Tests: No results for input(s): TSH, T4TOTAL, FREET4, T3FREE, THYROIDAB in the last 72 hours. Anemia Panel: No results for input(s): VITAMINB12, FOLATE, FERRITIN, TIBC, IRON, RETICCTPCT in the last 72 hours. Urine analysis:    Component Value Date/Time   COLORURINE YELLOW 08/30/2018 1945   APPEARANCEUR HAZY (A) 08/30/2018 1945   LABSPEC 1.015 08/30/2018 1945   PHURINE 5.0 08/30/2018 1945   GLUCOSEU 50 (A) 08/30/2018 1945   HGBUR SMALL (A) 08/30/2018 1945   BILIRUBINUR NEGATIVE 08/30/2018 1945   BILIRUBINUR neg 03/03/2014 1152   KETONESUR 5 (A) 08/30/2018 1945   PROTEINUR >=300 (A) 08/30/2018 1945   UROBILINOGEN 0.2 03/10/2014 2307   NITRITE NEGATIVE 08/30/2018 1945   LEUKOCYTESUR NEGATIVE 08/30/2018 1945    Radiological Exams on Admission: Ct Head Wo Contrast  Result Date: 08/30/2018 CLINICAL DATA:  Decreased mental status. EXAM: CT HEAD WITHOUT CONTRAST TECHNIQUE: Contiguous axial images were obtained from the base of the skull through the vertex without intravenous contrast. COMPARISON:  None. FINDINGS: Brain: No evidence of acute infarction, hemorrhage, hydrocephalus, extra-axial collection or mass lesion/mass effect. There is significant volume loss and scattered microvascular ischemic changes. Evaluation was limited by significant motion artifact. Vascular: No hyperdense vessel or unexpected calcification. Skull: Normal. Negative for fracture or focal lesion. Sinuses/Orbits: There is complete opacification of the bilateral mastoid air cells. There is opacification of the sphenoid air cells and ethmoid air cells. There is mild mucosal thickening of the frontal sinuses. The mastoid air cells are essentially clear. Other: None. IMPRESSION: 1. Motion degraded study. 2. No definite acute intracranial abnormality given the limitation above. 3. Atrophy and microvascular  ischemic changes are noted. 4. Complete opacification of the maxillary sinuses and sphenoid sinuses. Electronically Signed   By: Constance Holster M.D.   On: 08/30/2018 18:57   Dg Chest Port 1 View  Result Date: 08/30/2018 CLINICAL DATA:  Altered mental status.  Kyphosis. EXAM: PORTABLE CHEST 1 VIEW COMPARISON:  04/12/2016 FINDINGS: Today's exam is very limited due to severe leftward rotation and kyphosis. The attempt at a frontal projection is fully oblique, which distorts typical contours. Atherosclerotic calcification of the aortic arch and descending thoracic aorta. Prior CABG noted. There is evidence of an aortic valve prosthesis. Dual lead pacer is in place. It is difficult to assess the left lower lobe due to the rotation. There is believed to be a moderate hiatal hernia probably causing some passive atelectasis, and projecting over portions of the left lower lobe. The patient's chin covers part of the left upper lobe and obscures this region. Thoracic spondylosis is present. IMPRESSION: 1. Highly suboptimal exam with reduced sensitivity due to prominent leftward rotation and thoracic kyphosis. 2. Moderate hiatal hernia. I cannot exclude left lower lobe airspace opacity although the appearance may be due to the severity of rotation and the hiatal hernia. 3. Aortic Atherosclerosis (ICD10-I70.0). Prior CABG, dual lead pacer, and aortic valve prosthesis. Electronically Signed   By: Van Clines M.D.   On: 08/30/2018 18:14    EKG: Independently reviewed.  Sinus rhythm, LBBB not significantly changed compared to prior except prior EKG showed paced rhythm.  Assessment/Plan Principal Problem:   Failure to thrive in adult Active Problems:   Essential hypertension   CAD (coronary artery disease)   S/P aortic valve replacement   S/P CABG x 3   Atrioventricular block, complete (HCC)   Pacemaker-[employer]   Dementia (HCC)   CKD (chronic kidney disease), stage II  Hypothyroidism  ERISHA PAUGH is a 84 y.o. female with medical history significant for dementia, CAD status post CABG, CHB s/p PPM, severeAS status post AVR, LBBB, type 2 diabetes, hypertension, CKD stage II, hypothyroidism, who is brought to the ED with progressive functional decline.   Functional decline and history of dementia: Progressive over the last 2 months with significant decline over the last 3 days per son.  No obvious reversible causes so far on admission.  She is dehydrated from poor oral intake.  Overall decline may be function of her progressive dementia. -Continue maintenance IV fluids overnight -Follow-up TSH and urine culture -PT/OT eval  CKD stage II: Chronic and stable.  Continue to monitor.  Hypokalemia: Repleting with IV fluids, oral solution if willing to take p.o.  Hypothyroidism: Continue Synthroid, TSH pending.  CAD s/p CABG Severe AS s/p AVR w/ pericardial tissue valve: CHB s/p PPM: Chronic and stable issues.  Continue aspirin and carvedilol.  She is no longer on atorvastatin.  Type 2 diabetes: Recently restarted on glipizide for A1c 9.3% on 08/05/2018. -SSI while in hospital   DVT prophylaxis: Subcutaneous heparin Code Status: DNR/DNI, confirmed with patient's son Family Communication: Discussed with son Barbara Garner by phone Disposition Plan: Pending clinical progress Consults called: None Admission status: Observation   Zada Finders MD [facility]  If 7PM-7AM, please contact night-coverage [email or web address]  08/30/2018, 9:34 PM

## 2018-08-30 NOTE — ED Provider Notes (Signed)
[facility] EMERGENCY DEPARTMENT Provider Note   CSN: [id number] Arrival date & time: 08/30/18  1708     History   Chief Complaint Chief Complaint  Patient presents with   Altered Mental Status    HPI Barbara Garner is a 84 y.o. female.     HPI   She presents for evaluation of altered mental status.  She is unable to give any history.  Level 5 caveat-altered mental status  Past Medical History:  Diagnosis Date   Atrioventricular block, complete -intermittent    CAD  S/P CABG x 3 08/2011    LIMA to diagonal branch, SVG to OM1, SVG to PDA, EVH via bilateral thighs   Carotid artery disease ([place])    a. Carotid US 6/07 with 37% LICA stenosis;  b. Carotid US 11/16:  Bilateral ICA 40-59% >> FU 1 year // c. Carotid US 11/17: R 40-59; L 1-39 // Carotid US 11/19: R 40-59; L 1-39 >> repeat 1 year   Chronic diastolic CHF (congestive heart failure) (HCC)    CKD (chronic kidney disease) stage 4, GFR 15-29 ml/min (HCC)    Cr 1.6 in 6/11, sees Dr. Arty Baumgartner   Contact dermatitis and other eczema, due to unspecified cause    Esophageal reflux    Extrinsic asthma, unspecified    hasn't used inhaler in past year   Headache(784.0)    hypertension    Hypothyroidism    Idiopathic peripheral neuropathy    Impaired vision    pt. reports that she identifies her meds by looking at the pillls, she is not able to read labels on bottles   Iron deficiency anemia, unspecified    LBBB (left bundle branch block)    Memory loss    Pacemaker MDT dual chamber    DOI 08/2011   Pure hypercholesterolemia    Severe aortic stenosis -S/P AVR     19 mm [facility] Ease pericardial tissue valve   Type II or unspecified type diabetes mellitus without mention of complication, not stated as uncontrolled     Patient Active Problem List   Diagnosis Date Noted   Thyroid activity decreased 05/14/2015   Hyperkalemia 05/25/2014   CN (constipation) 05/08/2014    Diabetes mellitus type 2, controlled ([place]) 03/11/2014   Dementia (Barbara Garner) 12/17/2013   Memory loss, short term 10/29/2013   Low back pain 09/24/2013   Sinus bradycardia 09/24/2012   Vitamin D deficiency disease 10/62/6948   Lichen sclerosus et atrophicus of the vulva 04/05/2012   Chronic diastolic CHF (congestive heart failure) ([place]) 11/15/2011   Pacemaker-Medtronic 09/13/2011   Atrial fibrillation (Barbara Garner) 09/12/2011   Complete heart block-intermittent 09/09/2011   S/P aortic valve replacement 09/06/2011   S/P CABG x 3 09/06/2011   IVCD (intraventricular conduction defect)    CKD (chronic kidney disease) stage 4, GFR 15-29 ml/min (HCC)    Carotid stenosis 05/30/2011   Squamous cell carcinoma of skin 07/21/2010   Hyperlipidemia 09/20/2006   ANEMIA-IRON DEFICIENCY 06/27/2006   PERIPHERAL NEUROPATHY 06/27/2006   Essential hypertension 06/27/2006   MYOCARDIAL INFARCTION, HX OF 06/27/2006   CAD (coronary artery disease) 06/27/2006   ASTHMA 06/27/2006   ECZEMA 06/27/2006    Past Surgical History:  Procedure Laterality Date   ABDOMINAL HYSTERECTOMY  1977   partial; fibroids   AORTIC VALVE REPLACEMENT  09/06/2011   Procedure: AORTIC VALVE REPLACEMENT (AVR);  Surgeon: Rexene Alberts, MD;  Location: [place];  Service: Open Heart Surgery;  Laterality: N/A;   bladder tack  [place] TEST  10/2002   normal (per patient)   carotid dopplers  2006   CATARACT EXTRACTION, BILATERAL  2003   COLONOSCOPY  04/1999   1 polyp   COLONOSCOPY  06/2004   Neg. Int hem   COLONOSCOPY  08/2013   CORONARY ARTERY BYPASS GRAFT  09/06/2011   Procedure: CORONARY ARTERY BYPASS GRAFTING (CABG);  Surgeon: Rexene Alberts, MD;  Location: [place];  Service: Open Heart Surgery;  Laterality: N/A;   DEXA  10/2003 ,09/2005   osteoporosis,  Osteopenia   ESOPHAGEAL DILATION  1997   ESOPHAGOGASTRODUODENOSCOPY  04/1999   HH; "watermelon stomach"  (severe gastritis)   ESOPHAGOGASTRODUODENOSCOPY  04/2002   Gastritis   LEFT AND RIGHT HEART CATHETERIZATION WITH CORONARY ANGIOGRAM N/A 08/11/2011   Procedure: LEFT AND RIGHT HEART CATHETERIZATION WITH CORONARY ANGIOGRAM;  Surgeon: Larey Dresser, MD;  Location: [facility] CATH LAB;  Service: Cardiovascular;  Laterality: N/A;   Opthy  11/1998;12/01;11/02   PERMANENT PACEMAKER INSERTION N/A Jun 01, 202013   Procedure: PERMANENT PACEMAKER INSERTION;  Surgeon: Deboraha Sprang, MD;  Location: [facility] CATH LAB;  Service: Cardiovascular;  Laterality: N/A;   PTCA  1610-9604   5 blockages   REFRACTIVE SURGERY  2006     OB History   No obstetric history on file.      Home Medications    Prior to Admission medications   Medication Sig Start Date End Date Taking? Authorizing Provider  aspirin 81 MG EC tablet Take 1 tablet (81 mg total) by mouth daily. 11/14/13   [name], PA-C  atorvastatin (LIPITOR) 40 MG tablet TAKE 1 TABLET BY MOUTH ONCE DAILY 02/26/18   [name], [name], MD  carvedilol (COREG) 25 MG tablet TAKE 1/2 (ONE-HALF) TABLET BY MOUTH TWICE DAILY WITH MEALS 02/26/18   [name], [name], MD  Cholecalciferol (VITAMIN D-3) 1000 UNITS CAPS Take 1 capsule by mouth daily.    [provider]  glipiZIDE (GLUCOTROL) 5 MG tablet Take 0.5 tablets (2.5 mg total) by mouth daily before breakfast. 08/07/18   [name], [name], MD  levothyroxine (SYNTHROID, LEVOTHROID) 25 MCG tablet Take 1 tablet (25 mcg total) by mouth daily before breakfast. 11/26/17   [name], [name], MD  polyethylene glycol powder (MIRALAX) powder Take 17 g by mouth daily. 09/27/17   [name], April, MD    Family History Family History  Problem Relation Age of Onset   Stroke Mother        died in her 34's   Stroke Father        died in his 67's   Diabetes Father    Prostate cancer Son    Diabetes Son        #2   Hypertension Son        #3   Thyroid disease Neg Hx    Heart attack Neg Hx     Social  History Social History   Tobacco Use   Smoking status: Never Smoker   Smokeless tobacco: Never Used  Substance Use Topics   Alcohol use: No    Alcohol/week: 0.0 standard drinks   Drug use: No     Allergies   Diflucan [fluconazole], Keflex [cephalexin], Metformin, and Promethazine hcl   Review of Systems Review of Systems  Unable to perform ROS: Mental status change     Physical Exam Updated Vital Signs BP (!) 158/82 (BP Location: Left Arm)    Pulse 69    Temp  98.8 F (37.1 C) (Oral)    Resp 18    SpO2 98%   Physical Exam Vitals signs and nursing note reviewed.  Constitutional:      General: She is in acute distress ([place]).     Appearance: She is well-developed. She is toxic-appearing. She is not diaphoretic.     Comments: Lying semi-fowler position, leaning to the left, with head flexed against chest.  Airway is intact, oxygen saturation 91% on room air.  HENT:     Head: Normocephalic and atraumatic.     Right Ear: External ear normal.     Left Ear: External ear normal.     Nose: Nose normal.     Mouth/Throat:     Pharynx: No oropharyngeal exudate.     Comments: Drooling clear mucus left side of mouth Eyes:     Conjunctiva/sclera: Conjunctivae normal.     Pupils: Pupils are equal, round, and reactive to light.  Neck:     Musculoskeletal: Normal range of motion and neck supple.     Trachea: Phonation normal.  Cardiovascular:     Rate and Rhythm: Normal rate and regular rhythm.     Heart sounds: Normal heart sounds. No friction rub.  Pulmonary:     Effort: Pulmonary effort is normal. No respiratory distress.     Breath sounds: Normal breath sounds. No stridor. No rhonchi.  Abdominal:     General: There is no distension.     Palpations: Abdomen is soft. There is no mass.     Tenderness: There is no abdominal tenderness.  Musculoskeletal:        General: No swelling or tenderness.  Skin:    General: Skin is warm and dry.  Neurological:     Motor: No  abnormal muscle tone.     Coordination: Coordination normal.     Comments: Symmetric tone, arms and legs bilaterally.  She does not move on command.  Eyes closed.  Psychiatric:        Attention and Perception: She is inattentive.        Speech: She is noncommunicative.      ED Treatments / Results  Labs (all labs ordered are listed, but only abnormal results are displayed) Labs Reviewed - No data to display  EKG    Radiology No results found.  Procedures Procedures (including critical care time)  Medications Ordered in ED Medications - No data to display   Initial Impression / Assessment and Plan / ED Course  I have reviewed the triage vital signs and the nursing notes.  Pertinent labs & imaging results that were available during my care of the patient were reviewed by me and considered in my medical decision making (see chart for details).         Patient Vitals for the past 24 hrs:  BP Temp Temp src Pulse Resp SpO2  08/30/18 1722 -- 98.8 F (37.1 C) Oral -- -- --  08/30/18 1716 (!) 158/82 -- -- 69 18 98 %    Labs and imaging ordered to seek source of AMS and weakness.  Family did not answer calls for additional history  Medical Decision Making: Debilitated patient with altered mental status. Wide differential for decreased responsiveness. She will require admission  CRITICAL CARE- No Performed by: Daleen Bo  Nursing Notes Reviewed/ Care Coordinated Applicable Imaging Reviewed Interpretation of Laboratory Data incorporated into ED treatment  Disposition per oncoming provider team following evaluation in ED  Final Clinical Impressions(s) / ED Diagnoses  Final diagnoses:  Altered mental status, unspecified altered mental status type  Generalized weakness  Dementia without behavioral disturbance, unspecified dementia type [facility])    ED Discharge Orders    None       Daleen Bo, MD 08/30/18 2151

## 2018-08-31 DIAGNOSIS — Z950 Presence of cardiac pacemaker: Secondary | ICD-10-CM

## 2018-08-31 DIAGNOSIS — Z951 Presence of aortocoronary bypass graft: Secondary | ICD-10-CM | POA: Diagnosis not present

## 2018-08-31 DIAGNOSIS — I5032 Chronic diastolic (congestive) heart failure: Secondary | ICD-10-CM | POA: Diagnosis present

## 2018-08-31 DIAGNOSIS — I447 Left bundle-branch block, unspecified: Secondary | ICD-10-CM | POA: Diagnosis present

## 2018-08-31 DIAGNOSIS — E119 Type 2 diabetes mellitus without complications: Secondary | ICD-10-CM | POA: Diagnosis not present

## 2018-08-31 DIAGNOSIS — Z7401 Bed confinement status: Secondary | ICD-10-CM | POA: Diagnosis not present

## 2018-08-31 DIAGNOSIS — M40204 Unspecified kyphosis, thoracic region: Secondary | ICD-10-CM | POA: Diagnosis present

## 2018-08-31 DIAGNOSIS — L259 Unspecified contact dermatitis, unspecified cause: Secondary | ICD-10-CM | POA: Diagnosis present

## 2018-08-31 DIAGNOSIS — Z681 Body mass index (BMI) 19 or less, adult: Secondary | ICD-10-CM | POA: Diagnosis not present

## 2018-08-31 DIAGNOSIS — I442 Atrioventricular block, complete: Secondary | ICD-10-CM | POA: Diagnosis present

## 2018-08-31 DIAGNOSIS — Z952 Presence of prosthetic heart valve: Secondary | ICD-10-CM | POA: Diagnosis not present

## 2018-08-31 DIAGNOSIS — E039 Hypothyroidism, unspecified: Secondary | ICD-10-CM | POA: Diagnosis present

## 2018-08-31 DIAGNOSIS — K449 Diaphragmatic hernia without obstruction or gangrene: Secondary | ICD-10-CM | POA: Diagnosis present

## 2018-08-31 DIAGNOSIS — E876 Hypokalemia: Secondary | ICD-10-CM | POA: Diagnosis present

## 2018-08-31 DIAGNOSIS — Z7189 Other specified counseling: Secondary | ICD-10-CM

## 2018-08-31 DIAGNOSIS — F0390 Unspecified dementia without behavioral disturbance: Secondary | ICD-10-CM

## 2018-08-31 DIAGNOSIS — I2510 Atherosclerotic heart disease of native coronary artery without angina pectoris: Secondary | ICD-10-CM | POA: Diagnosis present

## 2018-08-31 DIAGNOSIS — R627 Adult failure to thrive: Secondary | ICD-10-CM | POA: Diagnosis present

## 2018-08-31 DIAGNOSIS — I130 Hypertensive heart and chronic kidney disease with heart failure and stage 1 through stage 4 chronic kidney disease, or unspecified chronic kidney disease: Secondary | ICD-10-CM | POA: Diagnosis present

## 2018-08-31 DIAGNOSIS — E860 Dehydration: Secondary | ICD-10-CM | POA: Diagnosis present

## 2018-08-31 DIAGNOSIS — N184 Chronic kidney disease, stage 4 (severe): Secondary | ICD-10-CM | POA: Diagnosis present

## 2018-08-31 DIAGNOSIS — Z79899 Other long term (current) drug therapy: Secondary | ICD-10-CM | POA: Diagnosis not present

## 2018-08-31 DIAGNOSIS — Z1159 Encounter for screening for other viral diseases: Secondary | ICD-10-CM | POA: Diagnosis not present

## 2018-08-31 DIAGNOSIS — E1122 Type 2 diabetes mellitus with diabetic chronic kidney disease: Secondary | ICD-10-CM | POA: Diagnosis present

## 2018-08-31 DIAGNOSIS — D631 Anemia in chronic kidney disease: Secondary | ICD-10-CM | POA: Diagnosis present

## 2018-08-31 DIAGNOSIS — Z66 Do not resuscitate: Secondary | ICD-10-CM | POA: Diagnosis present

## 2018-08-31 DIAGNOSIS — Z888 Allergy status to other drugs, medicaments and biological substances status: Secondary | ICD-10-CM | POA: Diagnosis not present

## 2018-08-31 LAB — BASIC METABOLIC PANEL
Anion gap: 11 (ref 5–15)
BUN: 23 mg/dL (ref 8–23)
CO2: 25 mmol/L (ref 22–32)
Calcium: 8.5 mg/dL — ABNORMAL LOW (ref 8.9–10.3)
Chloride: 106 mmol/L (ref 98–111)
Creatinine, Ser: 1 mg/dL (ref 0.44–1.00)
GFR calc Af Amer: >60 mL/min (ref >60)
GFR calc non Af Amer: 52 mL/min — ABNORMAL LOW (ref >60)
Glucose, Bld: 104 mg/dL — ABNORMAL HIGH (ref 70–99)
Potassium: 3.6 mmol/L (ref 3.5–5.1)
Sodium: 142 mmol/L (ref 135–145)

## 2018-08-31 LAB — CBC
HCT: 27 % — ABNORMAL LOW (ref 36.0–46.0)
Hemoglobin: 8.5 g/dL — ABNORMAL LOW (ref 12.0–15.0)
MCH: 29.1 pg (ref 26.0–34.0)
MCHC: 31.5 g/dL (ref 30.0–36.0)
MCV: 92.5 fL (ref 80.0–100.0)
Platelets: 270 10*3/uL (ref 150–400)
RBC: 2.92 MIL/uL — ABNORMAL LOW (ref 3.87–5.11)
RDW: 14 % (ref 11.5–15.5)
WBC: 9.3 10*3/uL (ref 4.0–10.5)
nRBC: 0 % (ref 0.0–0.2)

## 2018-08-31 LAB — GLUCOSE, CAPILLARY
Glucose-Capillary: 85 mg/dL (ref 70–99)
Glucose-Capillary: 89 mg/dL (ref 70–99)
Glucose-Capillary: 123 mg/dL — ABNORMAL HIGH (ref 70–99)
Glucose-Capillary: 158 mg/dL — ABNORMAL HIGH (ref 70–99)

## 2018-08-31 MED ORDER — DEXTROSE IN LACTATED RINGERS 5 % IV SOLN
INTRAVENOUS | Status: DC
  Administered 2018-08-31 – 2018-09-01 (×2): via INTRAVENOUS

## 2018-08-31 MED ORDER — HYDRALAZINE HCL 20 MG/ML IJ SOLN: 5.0000 mg | Freq: Four times a day (QID) | INTRAMUSCULAR | Status: DC | PRN

## 2018-08-31 MED ORDER — MORPHINE SULFATE (PF) 2 MG/ML IV SOLN
1.0000 mg | INTRAVENOUS | Status: DC | PRN
  Administered 2018-09-01 – 2018-09-02 (×2): 1 mg via INTRAVENOUS
  Filled 2018-08-31 (×2): qty 1

## 2018-08-31 MED ORDER — LORAZEPAM 2 MG/ML IJ SOLN
0.5000 mg | Freq: Four times a day (QID) | INTRAMUSCULAR | Status: DC | PRN
  Administered 2018-08-31: 0.5 mg via INTRAVENOUS
  Filled 2018-08-31: qty 1

## 2018-08-31 NOTE — Progress Notes (Signed)
PROGRESS NOTE  Barbara Garner YQM:[id number] DOB: 1935-01-04 DOA: 08/30/2018 PCP: Darreld Mclean, MD  HPI/Recap of past 24 hours:  Does not open eyes, does not talk, does not follow commands Does not appear in pain or in distress No fever, no hypoxia,   She does not eat or swallow pills, she pocket food per RN, she is on hydration   Assessment/Plan: Principal Problem:   Failure to thrive in adult Active Problems:   Essential hypertension   CAD (coronary artery disease)   S/P aortic valve replacement   S/P CABG x 3   Atrioventricular block, complete (HCC)   Pacemaker-Medtronic   Dementia (HCC)   CKD (chronic kidney disease), stage II   Hypothyroidism  Progressive dementia/functional decline -presents with dehydration, decreased oral intake, on hydration -CT head no acute findings, no fever, no leukocytosis -currently on hydration, prognosis is poor, appear need end of life care. I have discussed with son over the phone regarding palliative care/hospice, son would like to talk to hospice to obtain more info -hospice liaison consulted   Hypokalemia: likely from poor oral intake, received iv k supplement  CAD s/p CABG Severe AS s/p AVR w/ pericardial tissue valve: CHB s/p PPM: She currently does not take pills  Goal of care discussion with son over the phone, will focus care on comfort  noninsulin dependent dm2 She does not eat, Does not take oral meds Goal of care discussion with son over the phone, will focus care on comfort  CKDII/III, anemia of chronic disease Appear at baseline  Code Status: DNR  Family Communication: patient , son over the phone  Disposition Plan: residential hospice when bed is available    Consultants:  Hospice   Procedures:  none  Antibiotics:  none   Objective: BP (!) 149/95 (BP Location: Right Arm)    Pulse 85    Temp 97.8 F (36.6 C) (Oral)    Resp 18    Wt 43.3 kg    SpO2 97%    BMI 18.64 kg/m   Intake/Output  Summary (Last 24 hours) at 08/31/2018 1747 Last data filed at 08/31/2018 0400 Gross per 24 hour  Intake 427.24 ml  Output --  Net 427.24 ml   Filed Weights   08/30/18 2303  Weight: 43.3 kg    Exam: Patient is examined daily including today on 08/31/2018, exams remain the same as of yesterday except that has changed    General:  Does not open eyes to voice, does not follow commands, does not appear in pain  Cardiovascular: RRR  Respiratory: CTABL  Abdomen: Soft/ND/NT, positive BS  Musculoskeletal: No Edema  Neuro: Does not open eyes to voice, does not follow  Data Reviewed: Basic Metabolic Panel: Recent Labs  Lab 08/30/18 1955 08/30/18 2002 08/31/18 0302  NA 142 143 142  K 3.2* 3.2* 3.6  CL 103  --  106  CO2 27  --  25  GLUCOSE 177*  --  104*  BUN 28*  --  23  CREATININE 1.13*  --  1.00  CALCIUM 8.8*  --  8.5*   Liver Function Tests: Recent Labs  Lab 08/30/18 1955  AST 30  ALT 19  ALKPHOS 65  BILITOT 0.6  PROT 6.7  ALBUMIN 2.3*   No results for input(s): LIPASE, AMYLASE in the last 168 hours. No results for input(s): AMMONIA in the last 168 hours. CBC: Recent Labs  Lab 08/30/18 1955 08/30/18 2002 08/31/18 0302  WBC 8.4  --  9.3  NEUTROABS 6.1  --   --   HGB 9.3* 9.5* 8.5*  HCT 30.4* 28.0* 27.0*  MCV 94.7  --  92.5  PLT 288  --  270   Cardiac Enzymes:   No results for input(s): CKTOTAL, CKMB, CKMBINDEX, TROPONINI in the last 168 hours. BNP (last 3 results) No results for input(s): BNP in the last 8760 hours.  ProBNP (last 3 results) No results for input(s): PROBNP in the last 8760 hours.  CBG: Recent Labs  Lab 08/31/18 0823 08/31/18 1237 08/31/18 1707  GLUCAP 85 89 123*    Recent Results (from the past 240 hour(s))  Culture, blood (routine x 2)     Status: None (Preliminary result)   Collection Time: 08/30/18  7:55 PM   Specimen: BLOOD  Result Value Ref Range Status   Specimen Description BLOOD BLOOD RIGHT HAND  Final   Special  Requests   Final    BOTTLES DRAWN AEROBIC AND ANAEROBIC Blood Culture adequate volume   Culture   Final    NO GROWTH < 12 HOURS Performed at [facility], [place] [address] [place], [place] [address]    Report Status PENDING  Incomplete  Culture, blood (routine x 2)     Status: None (Preliminary result)   Collection Time: 08/30/18  8:10 PM   Specimen: BLOOD RIGHT ARM  Result Value Ref Range Status   Specimen Description BLOOD RIGHT ARM  Final   Special Requests   Final    BOTTLES DRAWN AEROBIC AND ANAEROBIC Blood Culture adequate volume   Culture   Final    NO GROWTH < 12 HOURS Performed at [facility], [place] [address] [place], [place] [address]    Report Status PENDING  Incomplete     Studies: Ct Head Wo Contrast  Result Date: 08/30/2018 CLINICAL DATA:  Decreased mental status. EXAM: CT HEAD WITHOUT CONTRAST TECHNIQUE: Contiguous axial images were obtained from the base of the skull through the vertex without intravenous contrast. COMPARISON:  None. FINDINGS: Brain: No evidence of acute infarction, hemorrhage, hydrocephalus, extra-axial collection or mass lesion/mass effect. There is significant volume loss and scattered microvascular ischemic changes. Evaluation was limited by significant motion artifact. Vascular: No hyperdense vessel or unexpected calcification. Skull: Normal. Negative for fracture or focal lesion. Sinuses/Orbits: There is complete opacification of the bilateral mastoid air cells. There is opacification of the sphenoid air cells and ethmoid air cells. There is mild mucosal thickening of the frontal sinuses. The mastoid air cells are essentially clear. Other: None. IMPRESSION: 1. Motion degraded study. 2. No definite acute intracranial abnormality given the limitation above. 3. Atrophy and microvascular ischemic changes are noted. 4. Complete opacification of the maxillary sinuses and sphenoid sinuses. Electronically Signed   By: Constance Holster M.D.   On:  08/30/2018 18:57   Dg Chest Port 1 View  Result Date: 08/30/2018 CLINICAL DATA:  Altered mental status.  Kyphosis. EXAM: PORTABLE CHEST 1 VIEW COMPARISON:  04/12/2016 FINDINGS: Today's exam is very limited due to severe leftward rotation and kyphosis. The attempt at a frontal projection is fully oblique, which distorts typical contours. Atherosclerotic calcification of the aortic arch and descending thoracic aorta. Prior CABG noted. There is evidence of an aortic valve prosthesis. Dual lead pacer is in place. It is difficult to assess the left lower lobe due to the rotation. There is believed to be a moderate hiatal hernia probably causing some passive atelectasis, and projecting over portions of the left lower lobe.  The patient's chin covers part of the left upper lobe and obscures this region. Thoracic spondylosis is present. IMPRESSION: 1. Highly suboptimal exam with reduced sensitivity due to prominent leftward rotation and thoracic kyphosis. 2. Moderate hiatal hernia. I cannot exclude left lower lobe airspace opacity although the appearance may be due to the severity of rotation and the hiatal hernia. 3. Aortic Atherosclerosis (ICD10-I70.0). Prior CABG, dual lead pacer, and aortic valve prosthesis. Electronically Signed   By: Van Clines M.D.   On: 08/30/2018 18:14    Scheduled Meds:  aspirin EC  81 mg Oral Daily   carvedilol  12.5 mg Oral BID   heparin  5,000 Units Subcutaneous Q8H   insulin aspart  0-9 Units Subcutaneous TID WC   levothyroxine  25 mcg Oral QAC breakfast    Continuous Infusions:  dextrose 5% lactated ringers 75 mL/hr at 08/31/18 1415     Time spent: 33mins, case discussed with hospice  I have personally reviewed and interpreted on  08/31/2018 daily labs,  imagings as discussed above under date review session and assessment and plans.  I reviewed all nursing notes, pharmacy notes, consultant notes,  vitals, pertinent old records  I have discussed plan of  care as described above with RN , patient and family on 08/31/2018   Florencia Reasons MD, PhD  [facility] Pager [phone]. If 7PM-7AM, please contact night-coverage at [email or web address], password [facility] 08/31/2018, 5:47 PM  LOS: 0 days

## 2018-08-31 NOTE — Progress Notes (Addendum)
[facility]04 [facility]) [facility] RN note  Received request from Dr. Erlinda Hong to speak with family regarding home with hospice vs [facility]. Spoke with Barbara Garner, son and explained services. Barbara Garner spoke with his brothers and they have decided upon residential hospice.  Chart reviewed. Eligibility confirmed. Unfortunatley, [facility] is not able to offer a room today. Family and CSW aware [facility] liaison will follow up with CSW and family tomorrow or sooner if room becomes available.   Please do not hesitate to call with any hospice related questions.  Thank you, Margaretmary Eddy, RN, BSN [facility] Liaison  [place] are on AMION listed under Hospice and [place]

## 2018-08-31 NOTE — Progress Notes (Signed)
CSW spoke with Linus Orn from [facility].  Pt's family has requested [facility] for pt's disposition planning. [facility] does not have any beds available today (Saturday).  Will continue to follow.Reed Breech LCSWA [phone]

## 2018-08-31 NOTE — Progress Notes (Signed)
Paged MD Bodenheimer about switching the patient's oral medication to IV. Received a call back and was told that the potassium would be switched to IV and to just hold the Coreg for tonight. Will implement these orders and continue to monitor.

## 2018-08-31 NOTE — Progress Notes (Signed)
Occupational Therapy Evaluation Patient Details Name: Barbara Garner MRN: [id number] DOB: 02-24-1935 Today's Date: 08/31/2018    History of Present Illness Barbara Garner is a 84 y.o. female with medical history significant for dementia, CAD status post CABG, CHB s/p PPM, severeAS status post AVR, LBBB, type 2 diabetes, hypertension, CKD stage II, hypothyroidism, who brought to the ED by her son for progressive functional decline.    Clinical Impression   PTA, pt was living with her son, and was total assist with ADL/IADL and functional mobiltiy. Pt currently requires total assist for all ADL. Pt demonstrates significant functional limitations and cognitive limitations impacting her independence with ADL/IADL. Recommend air overlay mattress due to pt's immobility and high potential to develop pressure wounds. Provided pt was total assist pta, and currently requires totalA, anticipate pt will not tolerate therapy sessions or progress significantly. Therefor, no additional OT needs identified, OT to sign off. Pt will likely need long-term care, recommend palliative care consult.      Follow Up Recommendations  SNF;Supervision/Assistance - 24 hour;Other (comment)(anticipate pt will likely need long term care)    Equipment Recommendations  3 in 1 bedside commode;Hospital bed    Recommendations for Other Services (Palliative)     Precautions / Restrictions Precautions Precautions: Fall Restrictions Weight Bearing Restrictions: No      Mobility Bed Mobility Overal bed mobility: Needs Assistance Bed Mobility: Rolling;Supine to Sit;Sit to Supine Rolling: Total assist   Supine to sit: Total assist Sit to supine: Total assist   General bed mobility comments: total assist for all aspects of bed mobility  Transfers Overall transfer level: Needs assistance Equipment used: (face to face with wrap around support) Transfers: Sit to/from [employer] Sit to Stand: Total  assist   Squat pivot transfers: Total assist     General transfer comment: total assist for all aspects of transfer    Balance Overall balance assessment: Needs assistance   Sitting balance-Leahy Scale: Poor Sitting balance - Comments: able to sit on BSC with close supervision/min guard and heavy lean on arm rest Postural control: Left lateral lean   Standing balance-Leahy Scale: Zero                             ADL either performed or assessed with clinical judgement   ADL Overall ADL's : Needs assistance/impaired Eating/Feeding: Sitting;Total assistance   Grooming: Sitting;Maximal assistance;Total assistance Grooming Details (indicate cue type and reason): when provided with washcloth pt initiated washing face;modA for thorough completion Upper Body Bathing: Total assistance   Lower Body Bathing: Total assistance   Upper Body Dressing : Total assistance   Lower Body Dressing: Total assistance   Toilet Transfer: Total assistance   Toileting- Clothing Manipulation and Hygiene: Total assistance Toileting - Clothing Manipulation Details (indicate cue type and reason): hygiene and pericare performed Tub/ Shower Transfer: Total assistance   Functional mobility during ADLs: Total assistance General ADL Comments: pt with minimal engagement during session;decreased functional use of BUE, unable to fully assess secondary to cognition     Vision         Perception     Praxis      Pertinent Vitals/Pain Pain Assessment: Faces Faces Pain Scale: Hurts whole lot Pain Location: pain with movements and noted pain with left arm (appears swollen) Pain Descriptors / Indicators: Discomfort;Guarding;Grimacing;Moaning Pain Intervention(s): Monitored during session;Repositioned     Hand Dominance Right   Extremity/Trunk Assessment Upper Extremity Assessment Upper  Extremity Assessment: Generalized weakness;Difficult to assess due to impaired cognition(noted Left  elbow edema)   Lower Extremity Assessment Lower Extremity Assessment: Generalized weakness;Difficult to assess due to impaired cognition   Cervical / Trunk Assessment Cervical / Trunk Assessment: Kyphotic   Communication Communication Communication: (patient minimally verbal or interactive throughout session)   Cognition Arousal/Alertness: Lethargic Behavior During Therapy: Flat affect Overall Cognitive Status: History of cognitive impairments - at baseline                                     General Comments  pt saturated upon arrival, pericare and hygiene completed    Exercises     Shoulder Instructions      Home Living Family/patient expects to be discharged to:: Private residence Living Arrangements: Children Available Help at Discharge: Family Type of Home: House Home Access: Stairs to enter Technical brewer of Steps: 5 Entrance Stairs-Rails: Right;Left Home Layout: Multi-level     Bathroom Shower/Tub: Teacher, early years/pre: Standard     Home Equipment: Environmental consultant - 2 wheels;Walker - standard;Cane - single point   Additional Comments: All home set up information obtained from previous chart history, uncertain if remains accurate at this time will need confirmation from family      Prior Functioning/Environment Level of Independence: Needs assistance  Gait / Transfers Assistance Needed: per report from this admission, patient as required total assist for transfers and mobility ADL's / Homemaking Assistance Needed: total care   Comments: total assist for care in recent weeks        OT Problem List: Decreased strength;Decreased range of motion;Decreased activity tolerance;Impaired balance (sitting and/or standing);Decreased cognition;Decreased safety awareness;Decreased knowledge of use of DME or AE;Decreased knowledge of precautions;Impaired tone;Impaired UE functional use;Pain      OT Treatment/Interventions:      OT  Goals(Current goals can be found in the care plan section) Acute Rehab OT Goals Patient Stated Goal: none stated OT Goal Formulation: Patient unable to participate in goal setting Time For Goal Achievement: 09/14/18 Potential to Achieve Goals: Poor  OT Frequency:     Barriers to D/C:            Co-evaluation PT/OT/SLP Co-Evaluation/Treatment: Yes Reason for Co-Treatment: For patient/therapist safety;To address functional/ADL transfers;Complexity of the patient's impairments (multi-system involvement)   OT goals addressed during session: ADL's and self-care      AM-PAC OT "6 Clicks" Daily Activity     Outcome Measure Help from another person eating meals?: A Lot Help from another person taking care of personal grooming?: Total Help from another person toileting, which includes using toliet, bedpan, or urinal?: Total Help from another person bathing (including washing, rinsing, drying)?: Total Help from another person to put on and taking off regular upper body clothing?: Total Help from another person to put on and taking off regular lower body clothing?: Total 6 Click Score: 7   End of Session Nurse Communication: Mobility status  Activity Tolerance: Patient limited by pain;Patient tolerated treatment well Patient left: in bed;with call bell/phone within reach;with bed alarm set  OT Visit Diagnosis: Unsteadiness on feet (R26.81);Other abnormalities of gait and mobility (R26.89);Muscle weakness (generalized) (M62.81);Other symptoms and signs involving cognitive function;Adult, failure to thrive (R62.7);Pain Pain - part of body: (unclear)                Time: 0175-1025 OT Time Calculation (min): 23 min Charges:  OT General Charges $  OT Visit: 1 Visit OT Evaluation $OT Eval Moderate Complexity: [place] OTR/L Acute Rehabilitation Services Office: [place] 08/31/2018, 10:36 AM

## 2018-08-31 NOTE — Progress Notes (Signed)
Meds not given this am , patient can not follow instruction to swallow . Drooled small amount of water and applesauce that was attempted to be given. MD aware.

## 2018-08-31 NOTE — Progress Notes (Signed)
Pt. arrived to 6N04 from the ED on a stretcher. Pt. lying in fetal position and had to be transferred from the stretcher to the bed. The pt. is alert to voice only and is oriented to person. Pt. is not complaining of pain at this time and VSS.

## 2018-08-31 NOTE — Plan of Care (Signed)
  Problem: Clinical Measurements: Goal: Ability to maintain clinical measurements within normal limits will improve Outcome: Progressing Goal: Will remain free from infection Outcome: Progressing Goal: Cardiovascular complication will be avoided Outcome: Progressing   Problem: Coping: Goal: Level of anxiety will decrease Outcome: Progressing   Problem: Elimination: Goal: Will not experience complications related to urinary retention Outcome: Progressing   Problem: Pain Managment: Goal: General experience of comfort will improve Outcome: Progressing   Problem: Safety: Goal: Ability to remain free from injury will improve Outcome: Progressing   Problem: Skin Integrity: Goal: Risk for impaired skin integrity will decrease Outcome: Progressing

## 2018-08-31 NOTE — Evaluation (Signed)
Physical Therapy Evaluation Patient Details Name: Barbara Garner MRN: [id number] DOB: [birth year] Today's Date: 08/31/2018   History of Present Illness  Barbara Garner is a 84 y.o. female with medical history significant for dementia, CAD status post CABG, CHB s/p PPM, severeAS status post AVR, LBBB, type 2 diabetes, hypertension, CKD stage II, hypothyroidism, who brought to the ED by her son for progressive functional decline.   Clinical Impression  Orders received for PT evaluation. Patient demonstrates significant deficits in functional mobility as indicated below. Given history leading to admission, prior level of function, ability to engage in activity and current deficits. Do not believe patient will tolerate or make significant gains with continued physical therapy. Anticipate patient will likely need LTC. Patient and family may benefit from palliative consult at this time. At this time, no further acute PT needs as reported patient has been total care prior to admission. Will sign off. Please consider re-consult if patient demonstrates significant changes in engagement.  OF NOTE: May want to consider air overlay mattress due to immobility and potential for development of pressure wounds.     Follow Up Recommendations Supervision/Assistance - 24 hour;SNF(Likely LTC, may benefit from paliative)    Equipment Recommendations  Hospital bed;Wheelchair (measurements PT)    Recommendations for Other Services       Precautions / Restrictions Precautions Precautions: Fall      Mobility  Bed Mobility Overal bed mobility: Needs Assistance Bed Mobility: Rolling;Supine to Sit;Sit to Supine Rolling: Total assist   Supine to sit: Total assist Sit to supine: Total assist   General bed mobility comments: total assist for all aspects of bed mobility  Transfers Overall transfer level: Needs assistance Equipment used: (face to face with wrap around support) Transfers: Sit to/from [employer] Sit to Stand: Total assist   Squat pivot transfers: Total assist     General transfer comment: total assist for all aspects of transfer  Ambulation/Gait             General Gait Details: unable to perform  Stairs            Wheelchair Mobility    Modified Rankin (Stroke Patients Only)       Balance Overall balance assessment: Needs assistance   Sitting balance-Leahy Scale: Poor Sitting balance - Comments: able to sit on BSC with close supervision/min guard and heavy lean on arm rest Postural control: Left lateral lean   Standing balance-Leahy Scale: Zero                               Pertinent Vitals/Pain Pain Assessment: Faces Faces Pain Scale: Hurts whole lot Pain Location: pain with movements and noted pain with left arm (appears swollen) Pain Descriptors / Indicators: Discomfort;Guarding;Grimacing;Moaning Pain Intervention(s): Monitored during session;Repositioned    Home Living Family/patient expects to be discharged to:: Private residence Living Arrangements: Children Available Help at Discharge: Family Type of Home: House Home Access: Stairs to enter Entrance Stairs-Rails: Psychiatric nurse of Steps: 5 Home Layout: Multi-level Home Equipment: Environmental consultant - 2 wheels;Walker - standard;Cane - single point Additional Comments: All home set up information obtained from previous chart history, uncertain if remains accurate at this time will need confirmation from family    Prior Function Level of Independence: Needs assistance   Gait / Transfers Assistance Needed: per report from this admission, patient as required total assist for transfers and mobility  ADL's / Homemaking Assistance Needed:  total care  Comments: total assist for care in recent weeks     Hand Dominance   Dominant Hand: Right    Extremity/Trunk Assessment   Upper Extremity Assessment Upper Extremity Assessment: Generalized  weakness;Difficult to assess due to impaired cognition(noted Left elbow edema)    Lower Extremity Assessment Lower Extremity Assessment: Generalized weakness;Difficult to assess due to impaired cognition    Cervical / Trunk Assessment Cervical / Trunk Assessment: Kyphotic  Communication   Communication: (patient minimally verbal or interactive throughout session)  Cognition Arousal/Alertness: Lethargic Behavior During Therapy: Flat affect Overall Cognitive Status: History of cognitive impairments - at baseline                                        General Comments General comments (skin integrity, edema, etc.): hygiene and pericare performed    Exercises     Assessment/Plan    PT Assessment All further PT needs can be met in the next venue of care  PT Problem List Decreased strength;Decreased activity tolerance;Decreased balance;Decreased mobility;Decreased coordination;Decreased cognition;Decreased safety awareness;Pain       PT Treatment Interventions      PT Goals (Current goals can be found in the Care Plan section)  Acute Rehab PT Goals Patient Stated Goal: none stated PT Goal Formulation: Patient unable to participate in goal setting    Frequency     Barriers to discharge        Co-evaluation               AM-PAC PT "6 Clicks" Mobility  Outcome Measure Help needed turning from your back to your side while in a flat bed without using bedrails?: Total Help needed moving from lying on your back to sitting on the side of a flat bed without using bedrails?: Total Help needed moving to and from a bed to a chair (including a wheelchair)?: Total Help needed standing up from a chair using your arms (e.g., wheelchair or bedside chair)?: Total Help needed to walk in hospital room?: Total Help needed climbing 3-5 steps with a railing? : Total 6 Click Score: 6    End of Session   Activity Tolerance: No increased pain;Patient limited by  fatigue;Patient limited by lethargy Patient left: in bed;with call bell/phone within reach;with bed alarm set Nurse Communication: Mobility status PT Visit Diagnosis: Other symptoms and signs involving the nervous system (R29.898);Muscle weakness (generalized) (M62.81)    Time: 2956-2130 PT Time Calculation (min) (ACUTE ONLY): 23 min   Charges:   PT Evaluation $PT Eval Moderate Complexity: 1 Mod          Alben Deeds, PT DPT  Board Certified Neurologic Specialist Acute Rehabilitation Services Pager [phone] Office [place] 08/31/2018, 10:10 AM

## 2018-09-01 DIAGNOSIS — I442 Atrioventricular block, complete: Secondary | ICD-10-CM

## 2018-09-01 LAB — URINE CULTURE: Culture: NO GROWTH

## 2018-09-01 LAB — GLUCOSE, CAPILLARY: Glucose-Capillary: 166 mg/dL — ABNORMAL HIGH (ref 70–99)

## 2018-09-01 MED ORDER — FLUTICASONE PROPIONATE 50 MCG/ACT NA SUSP
2.0000 | Freq: Every day | NASAL | Status: DC
  Filled 2018-09-01: qty 16

## 2018-09-01 MED ORDER — SODIUM CHLORIDE 0.9% FLUSH: 10.0000 mL | Freq: Two times a day (BID) | INTRAVENOUS | Status: DC

## 2018-09-01 MED ORDER — SODIUM CHLORIDE 0.9% FLUSH: 10.0000 mL | INTRAVENOUS | Status: DC | PRN

## 2018-09-01 NOTE — Progress Notes (Signed)
Pt responded to me when I asked her how she was today.  Was unable to get her to drink anything this am.  Positioned for comfort and Peace blend aromatherapy at bedside.

## 2018-09-01 NOTE — Progress Notes (Signed)
Repositioned for comfort and morphine 1 mg given for pain.

## 2018-09-01 NOTE — Progress Notes (Signed)
Pt resting comfortably

## 2018-09-01 NOTE — Progress Notes (Signed)
Spoke to [place], son about pt condition and he will relay to his brother, [name].  Pt is comfortable and is being repositioned as needed.  Has Morphine PRN for discomfort.

## 2018-09-01 NOTE — Progress Notes (Signed)
PROGRESS NOTE  Barbara Garner QQV:[id number] DOB: Jun 22, 1934 DOA: 08/30/2018 PCP: Darreld Mclean, MD  HPI/Recap of past 24 hours:  Does not open eyes,  She does started to talk a few words, but does not follow commands Does not appear in pain or in distress No fever, no hypoxia,   She does not eat or swallow pills, she pocket food per RN, she is on hydration   Awaiting for residential hospice placement  Assessment/Plan: Principal Problem:   Failure to thrive in adult Active Problems:   Essential hypertension   CAD (coronary artery disease)   S/P aortic valve replacement   S/P CABG x 3   Atrioventricular block, complete (HCC)   Pacemaker-Medtronic   Dementia ([place])   Diabetes mellitus type 2, noninsulin dependent ([place])   CKD (chronic kidney disease), stage II   Hypothyroidism   Hypokalemia   Goals of care, counseling/discussion  Progressive dementia/functional decline -presents with dehydration, decreased oral intake, on hydration -CT head no acute findings, no fever, no leukocytosis -currently on hydration, prognosis is poor, appear need end of life care. I have discussed with son over the phone regarding palliative care/hospice, son would like to talk to hospice to obtain more info -hospice liaison consulted, she is accepted at [facility], hopefully there is a bed on 6/15, also awaiting covid screening test result, hopefully result will come back sometime today ( it was sent out on 6/12)   Hypokalemia: likely from poor oral intake, received iv k supplement, stop checking labs, focus on comfort  CAD s/p CABG Severe AS s/p AVR w/ pericardial tissue valve: CHB s/p PPM: She currently does not take pills  Goal of care discussion with son over the phone, will focus care on comfort  noninsulin dependent dm2 She does not eat, Does not take oral meds Goal of care discussion with son over the phone, will focus care on comfort  CKDII/III, anemia of chronic disease  Appear at baseline Goal of care discussion with son over the phone, will focus care on comfort  Code Status: DNR  Family Communication: patient , son over the phone daily  Disposition Plan: residential hospice when bed is available , also awaiting for covid screening result   Consultants:  Hospice   Procedures:  none  Antibiotics:  none   Objective: BP (!) (P) 175/78 (BP Location: Left Arm)   Pulse (P) 85   Temp (P) 98.5 F (36.9 C) (Oral)   Resp 18   Wt 43.3 kg   SpO2 (P) 95%   BMI 18.64 kg/m   Intake/Output Summary (Last 24 hours) at 09/01/2018 1100 Last data filed at 08/31/2018 3329 Gross per 24 hour  Intake 388.48 ml  Output 150 ml  Net 238.48 ml   Filed Weights   08/30/18 2303  Weight: 43.3 kg    Exam: Patient is examined daily including today on 09/01/2018, exams remain the same as of yesterday except that has changed    General:  Does not open eyes to voice, does not follow commands, does not appear in pain  Cardiovascular: RRR  Respiratory: CTABL  Abdomen: Soft/ND/NT, positive BS  Musculoskeletal: No Edema  Neuro: Does not open eyes to voice, does not follow  Data Reviewed: Basic Metabolic Panel: Recent Labs  Lab 08/30/18 1955 08/30/18 2002 08/31/18 0302  NA 142 143 142  K 3.2* 3.2* 3.6  CL 103  --  106  CO2 27  --  25  GLUCOSE 177*  --  104*  BUN 28*  --  23  CREATININE 1.13*  --  1.00  CALCIUM 8.8*  --  8.5*   Liver Function Tests: Recent Labs  Lab 08/30/18 1955  AST 30  ALT 19  ALKPHOS 65  BILITOT 0.6  PROT 6.7  ALBUMIN 2.3*   No results for input(s): LIPASE, AMYLASE in the last 168 hours. No results for input(s): AMMONIA in the last 168 hours. CBC: Recent Labs  Lab 08/30/18 1955 08/30/18 2002 08/31/18 0302  WBC 8.4  --  9.3  NEUTROABS 6.1  --   --   HGB 9.3* 9.5* 8.5*  HCT 30.4* 28.0* 27.0*  MCV 94.7  --  92.5  PLT 288  --  270   Cardiac Enzymes:   No results for input(s): CKTOTAL, CKMB, CKMBINDEX,  TROPONINI in the last 168 hours. BNP (last 3 results) No results for input(s): BNP in the last 8760 hours.  ProBNP (last 3 results) No results for input(s): PROBNP in the last 8760 hours.  CBG: Recent Labs  Lab 08/31/18 0823 08/31/18 1237 08/31/18 1707 08/31/18 2108 09/01/18 0744  GLUCAP 85 89 123* 158* 166*    Recent Results (from the past 240 hour(s))  Culture, blood (routine x 2)     Status: None (Preliminary result)   Collection Time: 08/30/18  7:55 PM   Specimen: BLOOD  Result Value Ref Range Status   Specimen Description BLOOD BLOOD RIGHT HAND  Final   Special Requests   Final    BOTTLES DRAWN AEROBIC AND ANAEROBIC Blood Culture adequate volume   Culture   Final    NO GROWTH 2 DAYS Performed at [facility], [place] [address] [place], [place] [address]    Report Status PENDING  Incomplete  Culture, blood (routine x 2)     Status: None (Preliminary result)   Collection Time: 08/30/18  8:10 PM   Specimen: BLOOD RIGHT ARM  Result Value Ref Range Status   Specimen Description BLOOD RIGHT ARM  Final   Special Requests   Final    BOTTLES DRAWN AEROBIC AND ANAEROBIC Blood Culture adequate volume   Culture   Final    NO GROWTH 2 DAYS Performed at [facility], [place] [address], [place], [place] [address]    Report Status PENDING  Incomplete     Studies: No results found.  Scheduled Meds: . fluticasone  2 spray Each Nare Daily    Continuous Infusions: . dextrose 5% lactated ringers 75 mL/hr at 09/01/18 1638     Time spent: 93mins, case discussed with hospice  I have personally reviewed and interpreted on  09/01/2018 daily labs,  imagings as discussed above under date review session and assessment and plans.  I reviewed all nursing notes, pharmacy note,  vitals, pertinent old records  I have discussed plan of care as described above with RN , patient and family on 09/01/2018   Florencia Reasons MD, PhD  [facility] Pager [phone]. If 7PM-7AM, please  contact night-coverage at [email or web address], password [facility] 09/01/2018, 11:00 AM  LOS: 1 day

## 2018-09-02 LAB — NOVEL CORONAVIRUS, NAA (HOSP ORDER, SEND-OUT TO REF LAB; TAT 18-24 HRS): SARS-CoV-2, NAA: NOT DETECTED

## 2018-09-02 NOTE — Social Work (Signed)
CSW contacted by [place], she will complete paperwork with pt family at 11:30. Aware DNR is signed on chart, will request dc summary.   Bedside RN aware of plans for [facility].   Westley Hummer, MSW, [place] Work [phone]

## 2018-09-02 NOTE — Discharge Summary (Signed)
Physician Discharge Summary  Barbara Garner TIR:[id number] DOB: 31-May-1934 DOA: 08/30/2018  PCP: Darreld Mclean, MD  Admit date: 08/30/2018 Discharge date: 09/02/2018  Admitted From: home Disposition: Hospice  Recommendations for Outpatient Follow-up:  Being discharged to hospice. Home Health: none  Equipment/Devices: none  Discharge Condition:stable  CODE STATUS:SDNR  Diet recommendation: as tolerated for comfort.  Brief/Interim Summary: 84 y.o. female with medical history significant for dementia, CAD status post CABG, CHB s/p PPM, severeAS status post AVR, LBBB, type 2 diabetes, hypertension, CKD stage II, hypothyroidism, who brought to the ED by her son for progressive functional decline.    Patient has a known history of dementia, first noted about 5 years ago per recent PCP note.  She has been having progressive decline over the last 2 to 3 months and is requiring near 100% assist in transfers.  She currently lives with her son who cares for her.  He says that she has been bedridden for the last 2 months and no longer is able to stand up on her own or support herself sitting up.  They have requested a hospital bed which they are waiting for delivery.  Her only new medication is glipizide which she was previously on but restarted about 3 weeks ago due to elevated hemoglobin A1c.  Patient son, [name], recently went on vacation during which time patient was cared for by her other son.  When Mickey returned he noted significant further deterioration in that over the last 2-3 days she has essentially stopped talking and has had no oral intake.  ED Course:  Initial vitals showed BP 170/85, pulse 65, RR 20, temp 98.8 Fahrenheit, SPO2 94% on room air.  Labs are notable for WBC 8.4, hemoglobin 9.3, platelets 288,000, sodium 142, potassium 3.2, bicarb 27, BUN 28, creatinine 1.13, serum glucose 177, AST 30, ALT 19, alk phos 65, total bilirubin 0.6, albumin 2.3, lactic acid 1.0.   Urinalysis showed negative nitrites, negative leukocytes, many bacteria on microscopy.    VBG showed pH 7.525, PCO2 35.1, PO2 55.  Blood and urine cultures were obtained and pending.  Novel coronavirus test was obtained and pending.  CT head without contrast was limited due to motion degradation, no definite acute intracranial abnormality noted.  Atrophy and microvascular ischemic changes seen as well as complete opacification of the maxillary sinuses and sphenoid sinuses.  Portable chest x-ray was severely rotated to the left limited in addition to thoracic kyphosis, moderate hiatal hernia noted left lower lobe airspace unable to be adequately seen.  Prior CABG changes and PPM seen.  Patient was given 1 L LR and the hospitalist service was consulted to admit for further evaluation management of encephalopathy versus progressive functional decline.  Patient was admitted, Patient was felt to have functional decline/progressive dementia, seen by palliative care. After extensive discussion family decided for DNR hospice care and patient is being discharged to hospice house.  Discharge Diagnoses:   Progressive dementia/functional decline -presents with dehydration, decreased oral intake, on hydration -CT head no acute findings, no fever, no leukocytosis -This time after  extensive discussion family decided for hospice care and is patient is being discharged to hospice covid 19 is negative.  Hypokalemia:  Was repleted.   CADs/pCABG/Severe AS s/p AVR w/ pericardial tissue valve/CHB s/p PPM: She currently does not take pills  Noninsulin dependent dm2: She does not eat/Does not take oral meds Goal of care discussion with son over the phone, focus on comfort CKDII/III, anemia of chronic disease Appear at  baseline  Discharge Instructions   Allergies as of 09/02/2018      Reactions   Diflucan [fluconazole] Rash   Renal Failure   Keflex [cephalexin] Rash   rash   Metformin Other  (See Comments)   REACTION: intolerant   Promethazine Hcl Other (See Comments)   REACTION: u/k      Medication List    STOP taking these medications   glipiZIDE 5 MG tablet Commonly known as: GLUCOTROL     TAKE these medications   aspirin 81 MG EC tablet Take 1 tablet (81 mg total) by mouth daily.   atorvastatin 40 MG tablet Commonly known as: LIPITOR TAKE 1 TABLET BY MOUTH ONCE DAILY   carvedilol 25 MG tablet Commonly known as: COREG TAKE 1/2 (ONE-HALF) TABLET BY MOUTH TWICE DAILY WITH MEALS What changed:   how much to take  how to take this  when to take this   docusate sodium 100 MG capsule Commonly known as: COLACE Take 100 mg by mouth daily.   levothyroxine 25 MCG tablet Commonly known as: SYNTHROID Take 1 tablet (25 mcg total) by mouth daily before breakfast.   polyethylene glycol powder 17 GM/SCOOP powder Commonly known as: MiraLax Take 17 g by mouth daily.       Allergies  Allergen Reactions  . Diflucan [Fluconazole] Rash    Renal Failure  . Keflex [Cephalexin] Rash    rash  . Metformin Other (See Comments)    REACTION: intolerant  . Promethazine Hcl Other (See Comments)    REACTION: u/k    Consultations:  Palliative   Procedures/Studies: Ct Head Wo Contrast  Result Date: 08/30/2018 CLINICAL DATA:  Decreased mental status. EXAM: CT HEAD WITHOUT CONTRAST TECHNIQUE: Contiguous axial images were obtained from the base of the skull through the vertex without intravenous contrast. COMPARISON:  None. FINDINGS: Brain: No evidence of acute infarction, hemorrhage, hydrocephalus, extra-axial collection or mass lesion/mass effect. There is significant volume loss and scattered microvascular ischemic changes. Evaluation was limited by significant motion artifact. Vascular: No hyperdense vessel or unexpected calcification. Skull: Normal. Negative for fracture or focal lesion. Sinuses/Orbits: There is complete opacification of the bilateral mastoid air  cells. There is opacification of the sphenoid air cells and ethmoid air cells. There is mild mucosal thickening of the frontal sinuses. The mastoid air cells are essentially clear. Other: None. IMPRESSION: 1. Motion degraded study. 2. No definite acute intracranial abnormality given the limitation above. 3. Atrophy and microvascular ischemic changes are noted. 4. Complete opacification of the maxillary sinuses and sphenoid sinuses. Electronically Signed   By: Constance Holster M.D.   On: 08/30/2018 18:57   Dg Chest Port 1 View  Result Date: 08/30/2018 CLINICAL DATA:  Altered mental status.  Kyphosis. EXAM: PORTABLE CHEST 1 VIEW COMPARISON:  04/12/2016 FINDINGS: Today's exam is very limited due to severe leftward rotation and kyphosis. The attempt at a frontal projection is fully oblique, which distorts typical contours. Atherosclerotic calcification of the aortic arch and descending thoracic aorta. Prior CABG noted. There is evidence of an aortic valve prosthesis. Dual lead pacer is in place. It is difficult to assess the left lower lobe due to the rotation. There is believed to be a moderate hiatal hernia probably causing some passive atelectasis, and projecting over portions of the left lower lobe. The patient's chin covers part of the left upper lobe and obscures this region. Thoracic spondylosis is present. IMPRESSION: 1. Highly suboptimal exam with reduced sensitivity due to prominent leftward rotation and thoracic kyphosis.  2. Moderate hiatal hernia. I cannot exclude left lower lobe airspace opacity although the appearance may be due to the severity of rotation and the hiatal hernia. 3. Aortic Atherosclerosis (ICD10-I70.0). Prior CABG, dual lead pacer, and aortic valve prosthesis. Electronically Signed   By: Van Clines M.D.   On: 08/30/2018 18:14     Subjective: Patient is alert and awake, in fetal position, not in pain.  Discharge Exam: Vitals:   09/01/18 1447 09/02/18 0551  BP: (!)  184/63 (!) 154/87  Pulse: 70 79  Resp: 13   Temp: 100 F (37.8 C) 98.7 F (37.1 C)  SpO2: 99% 97%   Vitals:   08/31/18 1949 09/01/18 0320 09/01/18 1447 09/02/18 0551  BP: (!) 163/84 (!) 175/78 (!) 184/63 (!) 154/87  Pulse: 83 85 70 79  Resp:   13   Temp: 98.7 F (37.1 C) 98.5 F (36.9 C) 100 F (37.8 C) 98.7 F (37.1 C)  TempSrc: Oral Oral Oral Oral  SpO2: 95% 95% 99% 97%  Weight:        General: Pt is alert, awake, not in acute distress, elderly frail, in fetal position Cardiovascular: RRR, S1/S2 +, no rubs, no gallops Respiratory: CTA bilaterally, no wheezing, no rhonchi Abdominal: Soft, NT, ND, bowel sounds + Extremities: no edema, no cyanosis   The results of significant diagnostics from this hospitalization (including imaging, microbiology, ancillary and laboratory) are listed below for reference.     Microbiology: Recent Results (from the past 240 hour(s))  Novel Coronavirus,NAA,(SEND-OUT TO REF LAB - TAT 24-48 hrs); Hosp Order     Status: None   Collection Time: 08/30/18  5:41 PM   Specimen: Nasopharyngeal Swab; Respiratory  Result Value Ref Range Status   SARS-CoV-2, NAA NOT DETECTED NOT DETECTED Final    Comment: (NOTE) This test was developed and its performance characteristics determined by [employer]. This test has not been FDA cleared or approved. This test has been authorized by FDA under an Emergency Use Authorization (EUA). This test is only authorized for the duration of time the declaration that circumstances exist justifying the authorization of the emergency use of in vitro diagnostic tests for detection of SARS-CoV-2 virus and/or diagnosis of COVID-19 infection under section 564(b)(1) of the Act, 21 U.S.C. 161WRU-0(A)(5), unless the authorization is terminated or revoked sooner. When diagnostic testing is negative, the possibility of a false negative result should be considered in the context of a patient's recent exposures and the  presence of clinical signs and symptoms consistent with COVID-19. An individual without symptoms of COVID-19 and who is not shedding SARS-CoV-2 virus would expect to have a negative (not detected) result in this assay. Performed  At: [facility] [address] [place], [place] [id number] Rush Farmer MD NW:[id number]    [place]  Final    Comment: Performed at [facility], [place] [address], [place], [place] [address]  Urine culture     Status: None   Collection Time: 08/30/18  7:38 PM   Specimen: Urine, Catheterized  Result Value Ref Range Status   Specimen Description URINE, CATHETERIZED  Final   Special Requests NONE  Final   Culture   Final    NO GROWTH Performed at [facility], [address] [place], [place] [address]    Report Status 09/01/2018 FINAL  Final  Culture, blood (routine x 2)     Status: None (Preliminary result)   Collection Time: 08/30/18  7:55 PM   Specimen: BLOOD  Result Value Ref Range Status   Specimen Description BLOOD BLOOD RIGHT HAND  Final   Special Requests   Final    BOTTLES DRAWN AEROBIC AND ANAEROBIC Blood Culture adequate volume   Culture   Final    NO GROWTH 2 DAYS Performed at [facility], [address], [place], [place] [address]    Report Status PENDING  Incomplete  Culture, blood (routine x 2)     Status: None (Preliminary result)   Collection Time: 08/30/18  8:10 PM   Specimen: BLOOD RIGHT ARM  Result Value Ref Range Status   Specimen Description BLOOD RIGHT ARM  Final   Special Requests   Final    BOTTLES DRAWN AEROBIC AND ANAEROBIC Blood Culture adequate volume   Culture   Final    NO GROWTH 2 DAYS Performed at [facility], [place] [address], [place], [place] [address]    Report Status PENDING  Incomplete     Labs: BNP (last 3 results) No results for input(s): BNP in the last 8760 hours. Basic Metabolic Panel: Recent Labs  Lab 08/30/18 1955 08/30/18 2002  08/31/18 0302  NA 142 143 142  K 3.2* 3.2* 3.6  CL 103  --  106  CO2 27  --  25  GLUCOSE 177*  --  104*  BUN 28*  --  23  CREATININE 1.13*  --  1.00  CALCIUM 8.8*  --  8.5*   Liver Function Tests: Recent Labs  Lab 08/30/18 1955  AST 30  ALT 19  ALKPHOS 65  BILITOT 0.6  PROT 6.7  ALBUMIN 2.3*   No results for input(s): LIPASE, AMYLASE in the last 168 hours. No results for input(s): AMMONIA in the last 168 hours. CBC: Recent Labs  Lab 08/30/18 1955 08/30/18 2002 08/31/18 0302  WBC 8.4  --  9.3  NEUTROABS 6.1  --   --   HGB 9.3* 9.5* 8.5*  HCT 30.4* 28.0* 27.0*  MCV 94.7  --  92.5  PLT 288  --  270   Cardiac Enzymes: No results for input(s): CKTOTAL, CKMB, CKMBINDEX, TROPONINI in the last 168 hours. BNP: Invalid input(s): POCBNP CBG: Recent Labs  Lab 08/31/18 0823 08/31/18 1237 08/31/18 1707 08/31/18 2108 09/01/18 0744  GLUCAP 85 89 123* 158* 166*   D-Dimer No results for input(s): DDIMER in the last 72 hours. Hgb A1c No results for input(s): HGBA1C in the last 72 hours. Lipid Profile No results for input(s): CHOL, HDL, LDLCALC, TRIG, CHOLHDL, LDLDIRECT in the last 72 hours. Thyroid function studies Recent Labs    08/30/18 2203  TSH 1.549   Anemia work up No results for input(s): VITAMINB12, FOLATE, FERRITIN, TIBC, IRON, RETICCTPCT in the last 72 hours. Urinalysis    Component Value Date/Time   COLORURINE YELLOW 08/30/2018 1945   APPEARANCEUR HAZY (A) 08/30/2018 1945   LABSPEC 1.015 08/30/2018 1945   PHURINE 5.0 08/30/2018 1945   GLUCOSEU 50 (A) 08/30/2018 1945   HGBUR SMALL (A) 08/30/2018 1945   BILIRUBINUR NEGATIVE 08/30/2018 1945   BILIRUBINUR neg 03/03/2014 1152   KETONESUR 5 (A) 08/30/2018 1945   PROTEINUR >=300 (A) 08/30/2018 1945   UROBILINOGEN 0.2 03/10/2014 2307   NITRITE NEGATIVE 08/30/2018 1945   LEUKOCYTESUR NEGATIVE 08/30/2018 1945   Sepsis Labs Invalid input(s): PROCALCITONIN,  WBC,  LACTICIDVEN Microbiology Recent  Results (from the past 240 hour(s))  Novel Coronavirus,NAA,(SEND-OUT TO REF LAB - TAT 24-48 hrs); Hosp Order     Status: None  Collection Time: 08/30/18  5:41 PM   Specimen: Nasopharyngeal Swab; Respiratory  Result Value Ref Range Status   SARS-CoV-2, NAA NOT DETECTED NOT DETECTED Final    Comment: (NOTE) This test was developed and its performance characteristics determined by [employer]. This test has not been FDA cleared or approved. This test has been authorized by FDA under an Emergency Use Authorization (EUA). This test is only authorized for the duration of time the declaration that circumstances exist justifying the authorization of the emergency use of in vitro diagnostic tests for detection of SARS-CoV-2 virus and/or diagnosis of COVID-19 infection under section 564(b)(1) of the Act, 21 U.S.C. 322GUR-4(Y)(7), unless the authorization is terminated or revoked sooner. When diagnostic testing is negative, the possibility of a false negative result should be considered in the context of a patient's recent exposures and the presence of clinical signs and symptoms consistent with COVID-19. An individual without symptoms of COVID-19 and who is not shedding SARS-CoV-2 virus would expect to have a negative (not detected) result in this assay. Performed  At: [facility] [address] [place], [place] [id number] Rush Farmer MD TD:[id number]    [place]  Final    Comment: Performed at [facility], [place] [address], [place], [place] [address]  Urine culture     Status: None   Collection Time: 08/30/18  7:38 PM   Specimen: Urine, Catheterized  Result Value Ref Range Status   Specimen Description URINE, CATHETERIZED  Final   Special Requests NONE  Final   Culture   Final    NO GROWTH Performed at [facility], [address] [place], [place] [address]    Report Status 09/01/2018 FINAL  Final  Culture, blood (routine x 2)      Status: None (Preliminary result)   Collection Time: 08/30/18  7:55 PM   Specimen: BLOOD  Result Value Ref Range Status   Specimen Description BLOOD BLOOD RIGHT HAND  Final   Special Requests   Final    BOTTLES DRAWN AEROBIC AND ANAEROBIC Blood Culture adequate volume   Culture   Final    NO GROWTH 2 DAYS Performed at [facility], [place] [address], [place], [place] [address]    Report Status PENDING  Incomplete  Culture, blood (routine x 2)     Status: None (Preliminary result)   Collection Time: 08/30/18  8:10 PM   Specimen: BLOOD RIGHT ARM  Result Value Ref Range Status   Specimen Description BLOOD RIGHT ARM  Final   Special Requests   Final    BOTTLES DRAWN AEROBIC AND ANAEROBIC Blood Culture adequate volume   Culture   Final    NO GROWTH 2 DAYS Performed at [facility], [place] [address] [place], [place] [address]    Report Status PENDING  Incomplete     Time coordinating discharge: 35 minutes  SIGNED:   Antonieta Pert, MD  [facility] 09/02/2018, 11:00 AM  If 7PM-7AM, please contact night-coverage [email or web address]

## 2018-09-02 NOTE — Social Work (Addendum)
Clinical Social Worker facilitated patient discharge including contacting patient family (called son [name]) and facility to confirm patient discharge plans.  Clinical information faxed to facility and family agreeable with plan.  CSW arranged ambulance transport via [facility] to [facility] at 1:30pm RN to call [phone]  with report prior to discharge.  Clinical Social Worker will sign off for now as social work intervention is no longer needed. Please consult [place] again if new need arises.  Westley Hummer, MSW, [place] Social Worker [phone]

## 2018-09-02 NOTE — Progress Notes (Signed)
Report called to Arbie Cookey at [facility].

## 2018-09-02 NOTE — Progress Notes (Signed)
Nutrition Brief Note  RD working remotely. Chart reviewed. Pt now transitioning to comfort care.  No further nutrition interventions warranted at this time.  Please re-consult as needed.   Shemika Robbs A. Jimmye Norman, RD, LDN, [place] Registered Dietitian II Certified Diabetes Care and Education Specialist Pager: [phone] After hours Pager: [phone]

## 2018-09-04 LAB — CULTURE, BLOOD (ROUTINE X 2)
Culture: NO GROWTH
Culture: NO GROWTH
Special Requests: ADEQUATE
Special Requests: ADEQUATE

## 2018-09-18 DEATH — deceased

## 2018-09-25 ENCOUNTER — Other Ambulatory Visit (HOSPITAL_COMMUNITY): Payer: PPO

## 2019-08-13 ENCOUNTER — Other Ambulatory Visit: Payer: Self-pay | Admitting: Family Medicine
